# Patient Record
Sex: Female | Born: 1960 | Race: White | Hispanic: No | Smoking: Current every day smoker
Health system: Southern US, Community
[De-identification: ages and names within clinical notes are randomized; demographics above are authoritative.]

## PROBLEM LIST (undated history)

## (undated) DIAGNOSIS — N200 Calculus of kidney: Secondary | ICD-10-CM

## (undated) DIAGNOSIS — Z8711 Personal history of peptic ulcer disease: Secondary | ICD-10-CM

## (undated) DIAGNOSIS — F32A Depression, unspecified: Secondary | ICD-10-CM

## (undated) DIAGNOSIS — G2581 Restless legs syndrome: Secondary | ICD-10-CM

## (undated) DIAGNOSIS — I2510 Atherosclerotic heart disease of native coronary artery without angina pectoris: Secondary | ICD-10-CM

## (undated) DIAGNOSIS — R569 Unspecified convulsions: Secondary | ICD-10-CM

## (undated) DIAGNOSIS — Z720 Tobacco use: Secondary | ICD-10-CM

## (undated) DIAGNOSIS — E7800 Pure hypercholesterolemia, unspecified: Secondary | ICD-10-CM

## (undated) DIAGNOSIS — I10 Essential (primary) hypertension: Secondary | ICD-10-CM

## (undated) DIAGNOSIS — F329 Major depressive disorder, single episode, unspecified: Secondary | ICD-10-CM

## (undated) DIAGNOSIS — F319 Bipolar disorder, unspecified: Secondary | ICD-10-CM

## (undated) DIAGNOSIS — J42 Unspecified chronic bronchitis: Secondary | ICD-10-CM

## (undated) DIAGNOSIS — Z8719 Personal history of other diseases of the digestive system: Secondary | ICD-10-CM

## (undated) DIAGNOSIS — J189 Pneumonia, unspecified organism: Secondary | ICD-10-CM

## (undated) DIAGNOSIS — G43909 Migraine, unspecified, not intractable, without status migrainosus: Secondary | ICD-10-CM

## (undated) DIAGNOSIS — I5189 Other ill-defined heart diseases: Secondary | ICD-10-CM

## (undated) DIAGNOSIS — N393 Stress incontinence (female) (male): Secondary | ICD-10-CM

## (undated) DIAGNOSIS — F419 Anxiety disorder, unspecified: Secondary | ICD-10-CM

## (undated) DIAGNOSIS — R011 Cardiac murmur, unspecified: Secondary | ICD-10-CM

## (undated) DIAGNOSIS — M1990 Unspecified osteoarthritis, unspecified site: Secondary | ICD-10-CM

## (undated) DIAGNOSIS — I639 Cerebral infarction, unspecified: Secondary | ICD-10-CM

## (undated) DIAGNOSIS — G4730 Sleep apnea, unspecified: Secondary | ICD-10-CM

## (undated) DIAGNOSIS — I219 Acute myocardial infarction, unspecified: Secondary | ICD-10-CM

## (undated) DIAGNOSIS — R519 Headache, unspecified: Secondary | ICD-10-CM

## (undated) DIAGNOSIS — E119 Type 2 diabetes mellitus without complications: Secondary | ICD-10-CM

## (undated) DIAGNOSIS — K219 Gastro-esophageal reflux disease without esophagitis: Secondary | ICD-10-CM

## (undated) DIAGNOSIS — G4700 Insomnia, unspecified: Secondary | ICD-10-CM

## (undated) DIAGNOSIS — I739 Peripheral vascular disease, unspecified: Secondary | ICD-10-CM

## (undated) DIAGNOSIS — R51 Headache: Secondary | ICD-10-CM

## (undated) HISTORY — DX: Atherosclerotic heart disease of native coronary artery without angina pectoris: I25.10

## (undated) HISTORY — PX: KNEE ARTHROSCOPY: SHX127

## (undated) HISTORY — PX: CORONARY ANGIOPLASTY WITH STENT PLACEMENT: SHX49

## (undated) HISTORY — DX: Tobacco use: Z72.0

## (undated) HISTORY — DX: Other ill-defined heart diseases: I51.89

## (undated) HISTORY — PX: DILATION AND CURETTAGE OF UTERUS: SHX78

## (undated) HISTORY — PX: EXTRACORPOREAL SHOCK WAVE LITHOTRIPSY: SHX1557

## (undated) HISTORY — PX: TUBAL LIGATION: SHX77

## (undated) HISTORY — PX: TONSILLECTOMY: SUR1361

---

## 1982-04-21 HISTORY — PX: ABDOMINAL HYSTERECTOMY: SHX81

## 2004-03-01 ENCOUNTER — Emergency Department: Payer: Self-pay | Admitting: Emergency Medicine

## 2005-08-21 ENCOUNTER — Emergency Department: Payer: Self-pay | Admitting: Emergency Medicine

## 2008-07-12 ENCOUNTER — Emergency Department: Payer: Self-pay | Admitting: Emergency Medicine

## 2008-07-24 ENCOUNTER — Ambulatory Visit: Payer: Self-pay | Admitting: Cardiovascular Disease

## 2009-02-23 ENCOUNTER — Emergency Department: Payer: Self-pay | Admitting: Emergency Medicine

## 2009-02-28 ENCOUNTER — Ambulatory Visit: Payer: Self-pay | Admitting: Gastroenterology

## 2009-11-21 ENCOUNTER — Ambulatory Visit: Payer: Self-pay | Admitting: Cardiovascular Disease

## 2009-12-20 ENCOUNTER — Ambulatory Visit: Payer: Self-pay | Admitting: Internal Medicine

## 2011-05-07 ENCOUNTER — Emergency Department: Payer: Self-pay | Admitting: Unknown Physician Specialty

## 2011-05-07 LAB — COMPREHENSIVE METABOLIC PANEL
Alkaline Phosphatase: 63 U/L (ref 50–136)
Anion Gap: 14 (ref 7–16)
BUN: 18 mg/dL (ref 7–18)
Calcium, Total: 9.5 mg/dL (ref 8.5–10.1)
Chloride: 110 mmol/L — ABNORMAL HIGH (ref 98–107)
Co2: 23 mmol/L (ref 21–32)
Creatinine: 0.62 mg/dL (ref 0.60–1.30)
EGFR (Non-African Amer.): >60
SGOT(AST): 16 U/L (ref 15–37)
SGPT (ALT): 19 U/L
Sodium: 147 mmol/L — ABNORMAL HIGH (ref 136–145)

## 2011-05-07 LAB — CBC
MCH: 31.6 pg (ref 26.0–34.0)
MCHC: 33.6 g/dL (ref 32.0–36.0)
Platelet: 289 10*3/uL (ref 150–440)
RBC: 4.55 10*6/uL (ref 3.80–5.20)

## 2011-05-07 LAB — CK TOTAL AND CKMB (NOT AT ARMC): CK, Total: 39 U/L (ref 21–215)

## 2011-05-07 LAB — TROPONIN I: Troponin-I: <0.02 ng/mL

## 2011-08-14 ENCOUNTER — Ambulatory Visit: Payer: Self-pay

## 2012-08-23 ENCOUNTER — Emergency Department: Payer: Self-pay | Admitting: Emergency Medicine

## 2012-08-23 LAB — CBC
HCT: 37.5 % (ref 35.0–47.0)
HGB: 12.5 g/dL (ref 12.0–16.0)
MCHC: 33.2 g/dL (ref 32.0–36.0)
RBC: 4.03 10*6/uL (ref 3.80–5.20)
RDW: 14.6 % — ABNORMAL HIGH (ref 11.5–14.5)
WBC: 9.7 10*3/uL (ref 3.6–11.0)

## 2012-08-23 LAB — PROTIME-INR
INR: 0.9
Prothrombin Time: 12.5 secs (ref 11.5–14.7)

## 2012-08-23 LAB — COMPREHENSIVE METABOLIC PANEL
Albumin: 3.6 g/dL (ref 3.4–5.0)
Alkaline Phosphatase: 87 U/L (ref 50–136)
Anion Gap: 4 — ABNORMAL LOW (ref 7–16)
BUN: 15 mg/dL (ref 7–18)
Bilirubin,Total: 0.5 mg/dL (ref 0.2–1.0)
Calcium, Total: 8.8 mg/dL (ref 8.5–10.1)
Chloride: 112 mmol/L — ABNORMAL HIGH (ref 98–107)
EGFR (African American): >60
EGFR (Non-African Amer.): >60
Glucose: 133 mg/dL — ABNORMAL HIGH (ref 65–99)
Osmolality: 290 (ref 275–301)
Potassium: 3.6 mmol/L (ref 3.5–5.1)
SGPT (ALT): 18 U/L (ref 12–78)
Total Protein: 6.5 g/dL (ref 6.4–8.2)

## 2012-08-23 LAB — APTT: Activated PTT: 26.4 secs (ref 23.6–35.9)

## 2013-02-09 ENCOUNTER — Emergency Department: Payer: Self-pay | Admitting: Emergency Medicine

## 2013-02-09 LAB — URINALYSIS, COMPLETE
Bilirubin,UR: NEGATIVE
Glucose,UR: NEGATIVE mg/dL (ref 0–75)
Ketone: NEGATIVE
Ph: 6 (ref 4.5–8.0)
Specific Gravity: 1.005 (ref 1.003–1.030)
Squamous Epithelial: 2

## 2013-02-11 LAB — URINE CULTURE

## 2013-03-02 ENCOUNTER — Ambulatory Visit: Payer: Self-pay | Admitting: Adult Health

## 2013-08-29 ENCOUNTER — Ambulatory Visit: Payer: Self-pay | Admitting: Orthopedic Surgery

## 2013-09-29 LAB — URINALYSIS, COMPLETE
BILIRUBIN, UR: NEGATIVE
Bacteria: NONE SEEN
Glucose,UR: NEGATIVE mg/dL (ref 0–75)
KETONE: NEGATIVE
NITRITE: NEGATIVE
Ph: 6 (ref 4.5–8.0)
Protein: 100
SPECIFIC GRAVITY: 1.017 (ref 1.003–1.030)
WBC UR: 47 /HPF (ref 0–5)

## 2013-09-29 LAB — COMPREHENSIVE METABOLIC PANEL
ALBUMIN: 2.5 g/dL — AB (ref 3.4–5.0)
ALT: 40 U/L (ref 12–78)
ANION GAP: 6 — AB (ref 7–16)
AST: 26 U/L (ref 15–37)
Alkaline Phosphatase: 68 U/L
BILIRUBIN TOTAL: 0.4 mg/dL (ref 0.2–1.0)
BUN: 16 mg/dL (ref 7–18)
CO2: 29 mmol/L (ref 21–32)
CREATININE: 1.14 mg/dL (ref 0.60–1.30)
Calcium, Total: 9.2 mg/dL (ref 8.5–10.1)
Chloride: 103 mmol/L (ref 98–107)
EGFR (African American): >60
EGFR (Non-African Amer.): 56 — ABNORMAL LOW
GLUCOSE: 210 mg/dL — AB (ref 65–99)
OSMOLALITY: 283 (ref 275–301)
Potassium: 3.1 mmol/L — ABNORMAL LOW (ref 3.5–5.1)
Sodium: 138 mmol/L (ref 136–145)
Total Protein: 7.2 g/dL (ref 6.4–8.2)

## 2013-09-29 LAB — CBC
HCT: 33.6 % — ABNORMAL LOW (ref 35.0–47.0)
HGB: 11.1 g/dL — AB (ref 12.0–16.0)
MCH: 31.3 pg (ref 26.0–34.0)
MCHC: 33.1 g/dL (ref 32.0–36.0)
MCV: 95 fL (ref 80–100)
Platelet: 410 10*3/uL (ref 150–440)
RBC: 3.56 10*6/uL — AB (ref 3.80–5.20)
RDW: 14.4 % (ref 11.5–14.5)
WBC: 15.2 10*3/uL — AB (ref 3.6–11.0)

## 2013-09-30 ENCOUNTER — Inpatient Hospital Stay: Payer: Self-pay | Admitting: Internal Medicine

## 2013-10-01 LAB — URINE CULTURE

## 2013-10-01 LAB — WBC: WBC: 10.5 10*3/uL (ref 3.6–11.0)

## 2013-10-04 LAB — CULTURE, BLOOD (SINGLE)

## 2014-01-30 ENCOUNTER — Emergency Department: Payer: Self-pay | Admitting: Emergency Medicine

## 2014-01-30 LAB — URINALYSIS, COMPLETE
BILIRUBIN, UR: NEGATIVE
Glucose,UR: NEGATIVE mg/dL (ref 0–75)
Ketone: NEGATIVE
Nitrite: NEGATIVE
PH: 5 (ref 4.5–8.0)
PROTEIN: NEGATIVE
RBC,UR: 17 /HPF (ref 0–5)
Specific Gravity: 1.019 (ref 1.003–1.030)
Squamous Epithelial: 1

## 2014-01-30 LAB — BASIC METABOLIC PANEL
ANION GAP: 3 — AB (ref 7–16)
BUN: 18 mg/dL (ref 7–18)
CHLORIDE: 113 mmol/L — AB (ref 98–107)
Calcium, Total: 8.4 mg/dL — ABNORMAL LOW (ref 8.5–10.1)
Co2: 25 mmol/L (ref 21–32)
Creatinine: 0.81 mg/dL (ref 0.60–1.30)
EGFR (Non-African Amer.): >60
GLUCOSE: 141 mg/dL — AB (ref 65–99)
OSMOLALITY: 286 (ref 275–301)
POTASSIUM: 3.8 mmol/L (ref 3.5–5.1)
Sodium: 141 mmol/L (ref 136–145)

## 2014-01-30 LAB — CBC
HCT: 42.4 % (ref 35.0–47.0)
HGB: 13.4 g/dL (ref 12.0–16.0)
MCH: 30 pg (ref 26.0–34.0)
MCHC: 31.6 g/dL — ABNORMAL LOW (ref 32.0–36.0)
MCV: 95 fL (ref 80–100)
Platelet: 240 10*3/uL (ref 150–440)
RBC: 4.47 10*6/uL (ref 3.80–5.20)
RDW: 14.5 % (ref 11.5–14.5)
WBC: 9.7 10*3/uL (ref 3.6–11.0)

## 2014-01-30 LAB — TROPONIN I: Troponin-I: 0.04 ng/mL

## 2014-01-31 LAB — TROPONIN I: Troponin-I: 0.04 ng/mL

## 2014-06-13 ENCOUNTER — Observation Stay: Payer: Self-pay | Admitting: Emergency Medicine

## 2014-06-14 ENCOUNTER — Encounter (HOSPITAL_COMMUNITY): Payer: Self-pay | Admitting: General Practice

## 2014-06-14 ENCOUNTER — Inpatient Hospital Stay (HOSPITAL_COMMUNITY)
Admission: AD | Admit: 2014-06-14 | Discharge: 2014-06-16 | DRG: 247 | Disposition: A | Discharge status: Home / Self Care | Payer: Medicare Other | Source: Other Acute Inpatient Hospital | Attending: Internal Medicine | Admitting: Internal Medicine

## 2014-06-14 ENCOUNTER — Telehealth (HOSPITAL_COMMUNITY): Payer: Self-pay | Admitting: Physician Assistant

## 2014-06-14 DIAGNOSIS — Z79899 Other long term (current) drug therapy: Secondary | ICD-10-CM | POA: Diagnosis not present

## 2014-06-14 DIAGNOSIS — Z8249 Family history of ischemic heart disease and other diseases of the circulatory system: Secondary | ICD-10-CM

## 2014-06-14 DIAGNOSIS — I25111 Atherosclerotic heart disease of native coronary artery with angina pectoris with documented spasm: Secondary | ICD-10-CM

## 2014-06-14 DIAGNOSIS — E785 Hyperlipidemia, unspecified: Secondary | ICD-10-CM | POA: Diagnosis not present

## 2014-06-14 DIAGNOSIS — R748 Abnormal levels of other serum enzymes: Secondary | ICD-10-CM

## 2014-06-14 DIAGNOSIS — R079 Chest pain, unspecified: Secondary | ICD-10-CM

## 2014-06-14 DIAGNOSIS — E119 Type 2 diabetes mellitus without complications: Secondary | ICD-10-CM | POA: Diagnosis not present

## 2014-06-14 DIAGNOSIS — Z955 Presence of coronary angioplasty implant and graft: Secondary | ICD-10-CM | POA: Diagnosis not present

## 2014-06-14 DIAGNOSIS — J209 Acute bronchitis, unspecified: Secondary | ICD-10-CM | POA: Diagnosis not present

## 2014-06-14 DIAGNOSIS — G894 Chronic pain syndrome: Secondary | ICD-10-CM | POA: Insufficient documentation

## 2014-06-14 DIAGNOSIS — F319 Bipolar disorder, unspecified: Secondary | ICD-10-CM | POA: Diagnosis not present

## 2014-06-14 DIAGNOSIS — Z720 Tobacco use: Secondary | ICD-10-CM | POA: Diagnosis present

## 2014-06-14 DIAGNOSIS — I10 Essential (primary) hypertension: Secondary | ICD-10-CM | POA: Diagnosis present

## 2014-06-14 DIAGNOSIS — K219 Gastro-esophageal reflux disease without esophagitis: Secondary | ICD-10-CM | POA: Diagnosis not present

## 2014-06-14 DIAGNOSIS — F17210 Nicotine dependence, cigarettes, uncomplicated: Secondary | ICD-10-CM | POA: Diagnosis not present

## 2014-06-14 DIAGNOSIS — I25110 Atherosclerotic heart disease of native coronary artery with unstable angina pectoris: Principal | ICD-10-CM | POA: Diagnosis present

## 2014-06-14 DIAGNOSIS — G8929 Other chronic pain: Secondary | ICD-10-CM | POA: Diagnosis not present

## 2014-06-14 HISTORY — DX: Migraine, unspecified, not intractable, without status migrainosus: G43.909

## 2014-06-14 HISTORY — DX: Headache, unspecified: R51.9

## 2014-06-14 HISTORY — DX: Restless legs syndrome: G25.81

## 2014-06-14 HISTORY — DX: Stress incontinence (female) (male): N39.3

## 2014-06-14 HISTORY — DX: Personal history of other diseases of the digestive system: Z87.19

## 2014-06-14 HISTORY — DX: Depression, unspecified: F32.A

## 2014-06-14 HISTORY — DX: Headache: R51

## 2014-06-14 HISTORY — DX: Acute myocardial infarction, unspecified: I21.9

## 2014-06-14 HISTORY — DX: Anxiety disorder, unspecified: F41.9

## 2014-06-14 HISTORY — DX: Major depressive disorder, single episode, unspecified: F32.9

## 2014-06-14 HISTORY — DX: Unspecified convulsions: R56.9

## 2014-06-14 HISTORY — DX: Cerebral infarction, unspecified: I63.9

## 2014-06-14 HISTORY — DX: Gastro-esophageal reflux disease without esophagitis: K21.9

## 2014-06-14 HISTORY — DX: Unspecified chronic bronchitis: J42

## 2014-06-14 HISTORY — DX: Personal history of peptic ulcer disease: Z87.11

## 2014-06-14 HISTORY — DX: Pure hypercholesterolemia, unspecified: E78.00

## 2014-06-14 HISTORY — DX: Sleep apnea, unspecified: G47.30

## 2014-06-14 HISTORY — DX: Essential (primary) hypertension: I10

## 2014-06-14 HISTORY — DX: Calculus of kidney: N20.0

## 2014-06-14 HISTORY — DX: Cardiac murmur, unspecified: R01.1

## 2014-06-14 HISTORY — DX: Unspecified osteoarthritis, unspecified site: M19.90

## 2014-06-14 HISTORY — DX: Type 2 diabetes mellitus without complications: E11.9

## 2014-06-14 HISTORY — DX: Pneumonia, unspecified organism: J18.9

## 2014-06-14 HISTORY — DX: Bipolar disorder, unspecified: F31.9

## 2014-06-14 LAB — PROTIME-INR
INR: 0.92 (ref 0.00–1.49)
Prothrombin Time: 12.5 seconds (ref 11.6–15.2)

## 2014-06-14 LAB — GLUCOSE, CAPILLARY
GLUCOSE-CAPILLARY: 153 mg/dL — AB (ref 70–99)
GLUCOSE-CAPILLARY: 198 mg/dL — AB (ref 70–99)
GLUCOSE-CAPILLARY: 124 mg/dL — AB (ref 70–99)
GLUCOSE-CAPILLARY: 159 mg/dL — AB (ref 70–99)

## 2014-06-14 LAB — TROPONIN I
Troponin I: <0.03 ng/mL
Troponin I: <0.03 ng/mL
Troponin I: <0.03 ng/mL (ref <0.031)

## 2014-06-14 LAB — RAPID URINE DRUG SCREEN, HOSP PERFORMED
AMPHETAMINES: NOT DETECTED
Barbiturates: NOT DETECTED
Benzodiazepines: POSITIVE — AB
Cocaine: NOT DETECTED
Opiates: POSITIVE — AB
TETRAHYDROCANNABINOL: POSITIVE — AB

## 2014-06-14 LAB — CBC
HEMATOCRIT: 40.2 % (ref 36.0–46.0)
Hemoglobin: 13.3 g/dL (ref 12.0–15.0)
MCH: 30.6 pg (ref 26.0–34.0)
MCHC: 33.1 g/dL (ref 30.0–36.0)
MCV: 92.4 fL (ref 78.0–100.0)
Platelets: 280 10*3/uL (ref 150–400)
RBC: 4.35 MIL/uL (ref 3.87–5.11)
RDW: 13.9 % (ref 11.5–15.5)
WBC: 10.6 10*3/uL — AB (ref 4.0–10.5)

## 2014-06-14 LAB — COMPREHENSIVE METABOLIC PANEL
ALT: 15 U/L (ref 0–35)
AST: 14 U/L (ref 0–37)
Albumin: 3.8 g/dL (ref 3.5–5.2)
Alkaline Phosphatase: 71 U/L (ref 39–117)
Anion gap: 10 (ref 5–15)
BILIRUBIN TOTAL: 0.5 mg/dL (ref 0.3–1.2)
BUN: 18 mg/dL (ref 6–23)
CALCIUM: 9.2 mg/dL (ref 8.4–10.5)
CHLORIDE: 104 mmol/L (ref 96–112)
CO2: 25 mmol/L (ref 19–32)
Creatinine, Ser: 0.68 mg/dL (ref 0.50–1.10)
GFR calc Af Amer: >90 mL/min (ref >90)
GFR calc non Af Amer: >90 mL/min (ref >90)
GLUCOSE: 169 mg/dL — AB (ref 70–99)
POTASSIUM: 3.8 mmol/L (ref 3.5–5.1)
SODIUM: 139 mmol/L (ref 135–145)
Total Protein: 6.4 g/dL (ref 6.0–8.3)

## 2014-06-14 LAB — CREATININE, SERUM
Creatinine, Ser: 0.65 mg/dL (ref 0.50–1.10)
GFR calc non Af Amer: >90 mL/min (ref >90)

## 2014-06-14 LAB — PREGNANCY, URINE: Preg Test, Ur: NEGATIVE

## 2014-06-14 LAB — LIPASE, BLOOD: Lipase: 371 U/L — ABNORMAL HIGH (ref 11–59)

## 2014-06-14 MED ORDER — OXYCODONE HCL 5 MG PO TABS
5.0000 mg | ORAL_TABLET | Freq: Four times a day (QID) | ORAL | Status: DC | PRN
  Administered 2014-06-14 (×3): 5 mg via ORAL
  Filled 2014-06-14 (×5): qty 1

## 2014-06-14 MED ORDER — AZITHROMYCIN 500 MG PO TABS
500.0000 mg | ORAL_TABLET | Freq: Every day | ORAL | Status: AC
  Administered 2014-06-14: 500 mg via ORAL
  Filled 2014-06-14: qty 1

## 2014-06-14 MED ORDER — OXYMETAZOLINE HCL 0.05 % NA SOLN
1.0000 | Freq: Two times a day (BID) | NASAL | Status: DC | PRN
  Filled 2014-06-14: qty 15

## 2014-06-14 MED ORDER — ASPIRIN EC 325 MG PO TBEC
325.0000 mg | DELAYED_RELEASE_TABLET | Freq: Every day | ORAL | Status: DC
  Administered 2014-06-14: 325 mg via ORAL
  Filled 2014-06-14 (×2): qty 1

## 2014-06-14 MED ORDER — GUAIFENESIN ER 600 MG PO TB12
1200.0000 mg | ORAL_TABLET | Freq: Two times a day (BID) | ORAL | Status: DC
  Administered 2014-06-14 – 2014-06-16 (×4): 1200 mg via ORAL
  Filled 2014-06-14 (×9): qty 2

## 2014-06-14 MED ORDER — ENOXAPARIN SODIUM 40 MG/0.4ML ~~LOC~~ SOLN: 40.0000 mg | SUBCUTANEOUS | Status: DC

## 2014-06-14 MED ORDER — GABAPENTIN 300 MG PO CAPS
300.0000 mg | ORAL_CAPSULE | Freq: Three times a day (TID) | ORAL | Status: DC
  Administered 2014-06-14 – 2014-06-16 (×8): 300 mg via ORAL
  Filled 2014-06-14 (×17): qty 1

## 2014-06-14 MED ORDER — ONDANSETRON HCL 4 MG PO TABS: 4.0000 mg | ORAL_TABLET | Freq: Three times a day (TID) | ORAL | Status: DC | PRN

## 2014-06-14 MED ORDER — INSULIN GLARGINE 100 UNIT/ML ~~LOC~~ SOLN
5.0000 [IU] | Freq: Every day | SUBCUTANEOUS | Status: DC
  Administered 2014-06-14 – 2014-06-15 (×2): 5 [IU] via SUBCUTANEOUS
  Filled 2014-06-14 (×3): qty 0.05

## 2014-06-14 MED ORDER — INSULIN ASPART 100 UNIT/ML ~~LOC~~ SOLN
0.0000 [IU] | SUBCUTANEOUS | Status: DC
  Administered 2014-06-14 (×3): 2 [IU] via SUBCUTANEOUS
  Administered 2014-06-15: 13:00:00 5 [IU] via SUBCUTANEOUS

## 2014-06-14 MED ORDER — FLUTICASONE PROPIONATE 50 MCG/ACT NA SUSP
2.0000 | Freq: Every day | NASAL | Status: DC
  Administered 2014-06-14 – 2014-06-16 (×2): 2 via NASAL
  Filled 2014-06-14: qty 16

## 2014-06-14 MED ORDER — ASPIRIN 81 MG PO CHEW
81.0000 mg | CHEWABLE_TABLET | ORAL | Status: AC
  Administered 2014-06-15: 81 mg via ORAL
  Filled 2014-06-14: qty 1

## 2014-06-14 MED ORDER — SODIUM CHLORIDE 0.9 % IV SOLN
INTRAVENOUS | Status: DC
  Administered 2014-06-14: 12:00:00 via INTRAVENOUS

## 2014-06-14 MED ORDER — ALPRAZOLAM 0.5 MG PO TABS
1.0000 mg | ORAL_TABLET | Freq: Three times a day (TID) | ORAL | Status: DC | PRN
  Administered 2014-06-14 – 2014-06-15 (×3): 1 mg via ORAL
  Filled 2014-06-14 (×3): qty 2

## 2014-06-14 MED ORDER — AZITHROMYCIN 250 MG PO TABS
250.0000 mg | ORAL_TABLET | Freq: Every day | ORAL | Status: DC
  Administered 2014-06-15 – 2014-06-16 (×2): 250 mg via ORAL
  Filled 2014-06-14 (×3): qty 1

## 2014-06-14 MED ORDER — TAMSULOSIN HCL 0.4 MG PO CAPS
0.4000 mg | ORAL_CAPSULE | Freq: Every day | ORAL | Status: DC
  Administered 2014-06-14: 0.4 mg via ORAL
  Filled 2014-06-14 (×3): qty 1

## 2014-06-14 MED ORDER — SODIUM CHLORIDE 0.9 % IV SOLN
INTRAVENOUS | Status: DC
  Administered 2014-06-15: 05:00:00 via INTRAVENOUS

## 2014-06-14 MED ORDER — NICOTINE 21 MG/24HR TD PT24
21.0000 mg | MEDICATED_PATCH | Freq: Every day | TRANSDERMAL | Status: DC
  Administered 2014-06-14 – 2014-06-16 (×3): 21 mg via TRANSDERMAL
  Filled 2014-06-14 (×4): qty 1

## 2014-06-14 MED ORDER — CALCIUM CARBONATE ANTACID 500 MG PO CHEW
1.0000 | CHEWABLE_TABLET | Freq: Two times a day (BID) | ORAL | Status: DC
  Administered 2014-06-14 – 2014-06-15 (×3): 200 mg via ORAL
  Filled 2014-06-14 (×9): qty 1

## 2014-06-14 MED ORDER — ONDANSETRON HCL 4 MG/2ML IJ SOLN: 4.0000 mg | Freq: Four times a day (QID) | INTRAMUSCULAR | Status: DC | PRN

## 2014-06-14 MED ORDER — SODIUM CHLORIDE 0.9 % IJ SOLN: 3.0000 mL | INTRAMUSCULAR | Status: DC | PRN

## 2014-06-14 MED ORDER — LEVALBUTEROL HCL 0.63 MG/3ML IN NEBU
0.6300 mg | INHALATION_SOLUTION | Freq: Three times a day (TID) | RESPIRATORY_TRACT | Status: DC
  Administered 2014-06-14 (×2): 0.63 mg via RESPIRATORY_TRACT
  Filled 2014-06-14 (×5): qty 3

## 2014-06-14 MED ORDER — ACETAMINOPHEN 325 MG PO TABS: 650.0000 mg | ORAL_TABLET | ORAL | Status: DC | PRN

## 2014-06-14 MED ORDER — LISINOPRIL 10 MG PO TABS
10.0000 mg | ORAL_TABLET | Freq: Every day | ORAL | Status: DC
  Administered 2014-06-14 – 2014-06-15 (×2): 10 mg via ORAL
  Filled 2014-06-14 (×3): qty 1

## 2014-06-14 MED ORDER — METOPROLOL TARTRATE 12.5 MG HALF TABLET
25.0000 mg | ORAL_TABLET | Freq: Two times a day (BID) | ORAL | Status: DC
  Administered 2014-06-14 – 2014-06-15 (×3): 25 mg via ORAL
  Filled 2014-06-14 (×6): qty 1

## 2014-06-14 MED ORDER — SODIUM CHLORIDE 0.9 % IV SOLN: 250.0000 mL | INTRAVENOUS | Status: DC | PRN

## 2014-06-14 MED ORDER — REGADENOSON 0.4 MG/5ML IV SOLN
0.4000 mg | Freq: Once | INTRAVENOUS | Status: DC
  Filled 2014-06-14: qty 5

## 2014-06-14 MED ORDER — CARISOPRODOL 350 MG PO TABS
350.0000 mg | ORAL_TABLET | Freq: Three times a day (TID) | ORAL | Status: DC
  Administered 2014-06-14 (×2): 350 mg via ORAL
  Filled 2014-06-14 (×4): qty 1

## 2014-06-14 MED ORDER — PANTOPRAZOLE SODIUM 40 MG PO TBEC
40.0000 mg | DELAYED_RELEASE_TABLET | Freq: Every day | ORAL | Status: DC
  Administered 2014-06-14 – 2014-06-15 (×2): 40 mg via ORAL
  Filled 2014-06-14: qty 1

## 2014-06-14 MED ORDER — ONDANSETRON HCL 4 MG PO TABS
4.0000 mg | ORAL_TABLET | Freq: Three times a day (TID) | ORAL | Status: DC | PRN
  Administered 2014-06-16: 4 mg via ORAL
  Filled 2014-06-14 (×2): qty 1

## 2014-06-14 MED ORDER — SODIUM CHLORIDE 0.9 % IJ SOLN
3.0000 mL | Freq: Two times a day (BID) | INTRAMUSCULAR | Status: DC
Start: 2014-06-14 — End: 2014-06-15

## 2014-06-14 MED ORDER — ATORVASTATIN CALCIUM 40 MG PO TABS
40.0000 mg | ORAL_TABLET | Freq: Every day | ORAL | Status: DC
  Administered 2014-06-14 – 2014-06-15 (×2): 40 mg via ORAL
  Filled 2014-06-14 (×4): qty 1

## 2014-06-14 MED ORDER — ENOXAPARIN SODIUM 40 MG/0.4ML ~~LOC~~ SOLN
40.0000 mg | SUBCUTANEOUS | Status: DC
  Administered 2014-06-14: 40 mg via SUBCUTANEOUS
  Filled 2014-06-14 (×2): qty 0.4

## 2014-06-14 MED ORDER — ONDANSETRON HCL 4 MG/2ML IJ SOLN
4.0000 mg | Freq: Four times a day (QID) | INTRAMUSCULAR | Status: DC | PRN
  Administered 2014-06-14 – 2014-06-16 (×4): 4 mg via INTRAVENOUS
  Filled 2014-06-14 (×4): qty 2

## 2014-06-14 MED ORDER — LORATADINE 10 MG PO TABS
10.0000 mg | ORAL_TABLET | Freq: Every day | ORAL | Status: DC
  Administered 2014-06-14 – 2014-06-16 (×2): 10 mg via ORAL
  Filled 2014-06-14 (×4): qty 1

## 2014-06-14 MED ORDER — DIPHENHYDRAMINE HCL 25 MG PO CAPS: 50.0000 mg | ORAL_CAPSULE | Freq: Four times a day (QID) | ORAL | Status: DC | PRN

## 2014-06-14 NOTE — Progress Notes (Signed)
Cath delayed until tomorrow 7:30 am first case. Patient informed. Will resume diet and NPO past midnight. Patient's nurse notified.  Ramond DialSigned, Marcela Alatorre PA Pager: [phone]

## 2014-06-14 NOTE — H&P (Signed)
[facility] History and Physical                                                                                    Jennifer SenegalLisa [name], is a 54 y.o. female  MRN: [id number]   DOB - 1960/09/20  Admit Date - 06/14/2014  Outpatient Primary MD for the patient is No PCP Per Patient Dr. Juleen StarrGregory Means, [facility] Cardiologist: Dr. Excell SeltzerBaker, [facility]    HPI  Jennifer [name]  is a 54 y.o. female, with a past medical history of coronary artery disease (status post 2 stent placements), diabetes, hypertension, tobacco abuse, hyperlipidemia, bipolar disorder, GERD who presented [facility] with complaints of intermittent chest pain that started last Friday. The pain lasts for several minutes and radiates to her neck and down her arms bilaterally. She became significantly worried when it happened resting in bed and was so severe that she had to sit up and rock. The pain is worse with movement is associated with nausea and headache. Jennifer [name] has also been coughing a lot with congestion. She has had posttussive vomiting. She reports having no energy, and decreased exercise tolerance.  In [facility] her first troponin was negative, her EKG showed normal sinus rhythm. Chest x-ray was negative. We will admit her for acute coronary syndrome rule out, bronchitis, and likely atypical chest pain. Cardiology has been consulted to see her.  Of note, per the RN, patient was taking medications out of bottles from home. One of the bottles was listed as gabapentin but the pills inside were actually Concerta.  Review of Systems   In addition to the HPI above,  No Fever-chills, No problems swallowing food or Liquids, No Blood in stool or Urine, No dysuria, No new skin rashes or bruises, No new joints pains-aches,  No recent weight gain or loss, A full 10 point Review of Systems was done, except as stated above, all other Review of Systems were negative.  Social History History  Substance Use Topics   . Smoking status: Not on file  . Smokeless tobacco: Not on file  . Alcohol Use: Not on file   She has smoked 1-1/2 packs of cigarettes a day for the past 35 years. She denies excessive alcohol use.  Family History Both grandparents passed with MI.  Father passed with cirrhosis.  Mother passed with a brain tumor.  Prior to Admission medications   Not on File    Allergies  Allergen Reactions  . Lithium Other (See Comments)    Blood dyscrasia  . Tegretol [Carbamazepine] Other (See Comments)    Blood dyscrasia when on with Lithium  . Lodine [Etodolac] Other (See Comments)    Rash, N/V    Physical Exam  Vitals  Blood pressure 138/69, pulse 62, temperature 98 F (36.7 C), temperature source Oral, resp. rate 16, height 5\' 2"  (1.575 m), weight 83.3 kg (183 lb 10.3 oz), SpO2 97 %.   General:  Well-developed, well-nourished female lying in bed in NAD  Psych:  Mildly anxious, multiple complaints about being [place]. Otherwise cooperative.  Neuro:   No F.N deficits, ALL C.Nerves Intact, Strength 5/5  all 4 extremities, Sensation intact all 4 extremities.  ENT:  Ears and Eyes appear Normal, Conjunctivae clear, PER. Moist oral mucosa without erythema or exudates.  Neck:  Supple, No lymphadenopathy appreciated  Respiratory:  Minimal expiratory wheeze bilaterally, good air movement.  Cardiac:  RRR, No Murmurs, no LE edema noted, no JVD.    Abdomen:  Positive bowel sounds, Soft, Non tender, Non distended,  No masses appreciated  Skin:  No Cyanosis, Normal Skin Turgor, No Skin Rash or Bruise.  Extremities:  Able to move all 4. 5/5 strength in each,  no effusions.   My personal review of EKG: NSR, No ST changes noted.   Assessment & Plan  Principal Problem:   Chest pain at rest Active Problems:   Diabetes   Acute bronchitis   Tobacco abuse   Essential hypertension   GERD (gastroesophageal reflux disease)   Bipolar disorder    Chest pain   likely atypical from bronchitis, but will rule out acute coronary syndrome.   treat with aspirin, sublingual nitroglycerin, cycle troponins, cardiology consulted  Will also check CMET and Lipase.  Add Protonix, and Tums.  HTN Continue Metoprolol and Lisinopril  HLD Continue Statin.  Diabetes mellitus Start SSI every 4 hours while nothing by mouth. Check hemoglobin A1c. Carb modified diet  Acute bronchitis Treatment with Zpack.  Supportive care including Flonase, Claritin, nebulizers.  Tobacco abuse  Counseled to stop.  Transdermal nicotine daily.  GERD PPI therapy  Chronic pain Previous shoulder and knee surgeries. Continue Soma, gabapentin and PRN Norco. Check UDS  Bipolar DO. Continue Xanax.  DVT Prophylaxis: Lovenox  AM Labs Ordered, also please review Full Orders  Family Communication:   Patient is alert and oriented  Code Status:  Full  Condition:  Guarded  Time spent in minutes : [address]  PA-C on 06/14/2014 at 8:19 AM  Between 7am to 7pm - Pager - [phone]  After 7pm go to [email or web address] - password [email or web address]  And look for the night coverage person covering me after hours  [facility]

## 2014-06-14 NOTE — Progress Notes (Signed)
NP texted paged with update on patient status, awaiting call back or orders. Pt HR is stable, patient back in bed, 2L [place] placed, will get a BP to assess further.

## 2014-06-14 NOTE — Progress Notes (Addendum)
When [facility], patient stated she felt much and was resting and discomfort had eased, patient did not want XANAX at this time, will inform night RN.

## 2014-06-14 NOTE — Consult Note (Signed)
    CONSULT NOTE  Date: 06/14/2014               Patient Name:  Jennifer [name] MRN: [id number]  DOB: 07/06/1960 Age / Sex: 54 y.o., female        PCP: No PCP Per Patient Primary Cardiologist: [facility], [facility]            Referring Physician: Dhungel              Reason for Consult: Chest discomfort           History of Present Illness: Patient is a 54 y.o. female with a PMHx of CAD ( s/p placement of 2 stents) , DM, HTN, smoker, hyperlipidemia, bipolar disease , who was transferred from [facility] and was admitted to [facility] on 06/14/2014 for evaluation of chest discomfort.   The pain started last Friday ( 5 days ago). intermittant CP, radiation down both arms.  Last for 3-5 minutes.   Worse with walking around and associated with Nausea and headache.  Associated with dyspnea. Radiates to neck and jaw.  Burning, through to back, band like sensation She has chest discomfort with any amount of exertion   Has had URI symptoms - coughing. Some vomitting .   Has had lots of stress over the past year - family , health  issues, mental issues,   Smoker  - 1/2 - 1 ppd.  Sometimes more,  Less recently with e-cig.   + family hx      Medications: Outpatient medications: Prescriptions prior to admission  Medication Sig Dispense Refill Last Dose  . ALPRAZolam (XANAX PO) Take 1 tablet by mouth 3 (three) times daily as needed.   06/09/2014  . carisoprodol (SOMA) 350 MG tablet Take 350 mg by mouth 3 (three) times daily as needed for muscle spasms.   06/13/2014 at Unknown time  . gabapentin (NEURONTIN) 300 MG capsule Take 300 mg by mouth 3 (three) times daily as needed.   06/13/2014 at Unknown time  . ibuprofen (ADVIL,MOTRIN) 200 MG tablet Take 800 mg by mouth 4 (four) times daily as needed for moderate pain.   06/13/2014 at Unknown time  . lisinopril (PRINIVIL,ZESTRIL) 40 MG tablet Take 40 mg by mouth daily.   06/13/2014 at Unknown time  . oxycodone (OXY-IR) 5 MG capsule Take 5 mg by mouth every  4 (four) hours as needed for pain.   06/08/2014  . PRESCRIPTION MEDICATION Take 200 mg by mouth daily. Take 200 mg metoprolol daily.   06/13/2014 at 12 pm    Current medications: Current Facility-Administered Medications  Medication Dose Route Frequency Provider Last Rate Last Dose  . 0.9 %  sodium chloride infusion   Intravenous Continuous Marianne L York, PA-C      . acetaminophen (TYLENOL) tablet 650 mg  650 mg Oral Q4H PRN Nishant Dhungel, MD      . ALPRAZolam (XANAX) tablet 1 mg  1 mg Oral TID PRN Marianne L York, PA-C      . aspirin EC tablet 325 mg  325 mg Oral Daily Nishant Dhungel, MD      . atorvastatin (LIPITOR) tablet 40 mg  40 mg Oral q1800 Marianne L York, PA-C      . azithromycin (ZITHROMAX) tablet 500 mg  500 mg Oral Daily Marianne L York, PA-C       Followed by  . [START ON 06/15/2014] azithromycin (ZITHROMAX) tablet 250 mg  250 mg Oral Daily Marianne L York, PA-C      .   calcium carbonate (TUMS - dosed in mg elemental calcium) chewable tablet 200 mg of elemental calcium  1 tablet Oral BID WC Marianne L York, PA-C      . carisoprodol (SOMA) tablet 350 mg  350 mg Oral TID Marianne L York, PA-C      . diphenhydrAMINE (BENADRYL) capsule 50 mg  50 mg Oral Q6H PRN Marianne L York, PA-C      . enoxaparin (LOVENOX) injection 40 mg  40 mg Subcutaneous Q24H Nishant Dhungel, MD      . fluticasone (FLONASE) 50 MCG/ACT nasal spray 2 spray  2 spray Each Nare Daily Marianne L York, PA-C      . gabapentin (NEURONTIN) capsule 300 mg  300 mg Oral TID PC & HS Marianne L York, PA-C      . guaiFENesin (MUCINEX) 12 hr tablet 1,200 mg  1,200 mg Oral BID Marianne L York, PA-C      . insulin aspart (novoLOG) injection 0-9 Units  0-9 Units Subcutaneous 6 times per day Marianne L York, PA-C      . insulin glargine (LANTUS) injection 5 Units  5 Units Subcutaneous QHS Marianne L York, PA-C      . levalbuterol (XOPENEX) nebulizer solution 0.63 mg  0.63 mg Nebulization Q8H Marianne L York, PA-C      .  lisinopril (PRINIVIL,ZESTRIL) tablet 10 mg  10 mg Oral Daily Marianne L York, PA-C      . loratadine (CLARITIN) tablet 10 mg  10 mg Oral Daily Marianne L York, PA-C      . metoprolol tartrate (LOPRESSOR) tablet 25 mg  25 mg Oral BID Nishant Dhungel, MD      . nicotine (NICODERM CQ - dosed in mg/24 hours) patch 21 mg  21 mg Transdermal Daily Marianne L York, PA-C      . ondansetron (ZOFRAN) injection 4 mg  4 mg Intravenous Q6H PRN Marianne L York, PA-C      . ondansetron (ZOFRAN) tablet 4 mg  4 mg Oral Q8H PRN Nishant Dhungel, MD      . oxyCODONE (Oxy IR/ROXICODONE) immediate release tablet 5 mg  5 mg Oral Q6H PRN Marianne L York, PA-C      . oxymetazoline (AFRIN) 0.05 % nasal spray 1 spray  1 spray Each Nare BID PRN Marianne L York, PA-C      . pantoprazole (PROTONIX) EC tablet 40 mg  40 mg Oral Daily Marianne L York, PA-C      . tamsulosin (FLOMAX) capsule 0.4 mg  0.4 mg Oral QPC supper Marianne L York, PA-C         Allergies  Allergen Reactions  . Lithium Other (See Comments)    Blood dyscrasia  . Tegretol [Carbamazepine] Other (See Comments)    Blood dyscrasia when on with Lithium  . Lodine [Etodolac] Other (See Comments)    Rash, N/V     No past medical history on file.  No past surgical history on file.  No family history on file.  Social History:  has no tobacco, alcohol, and drug history on file.   Review of Systems: Constitutional:  denies fever, chills, diaphoresis, appetite change and fatigue.  HEENT: denies photophobia, eye pain, redness, hearing loss, ear pain, congestion, sore throat, rhinorrhea, sneezing, neck pain, neck stiffness and tinnitus.  Respiratory: admits to SOB, DOE, cough, chest tightness, and wheezing.  Cardiovascular: admits to chest pain,  Denies palpitations and leg swelling.  Gastrointestinal: admits to nausea, vomiting,    Genitourinary: denies dysuria, urgency,   frequency, hematuria, flank pain and difficulty urinating.  Musculoskeletal: denies   myalgias, back pain, joint swelling, arthralgias and gait problem.   Skin: denies pallor, rash and wound.  Neurological: denies dizziness, seizures, syncope, weakness, light-headedness, numbness and headaches.   Hematological: denies adenopathy, easy bruising, personal or family bleeding history.  Psychiatric/ Behavioral: denies suicidal ideation, mood changes, confusion, nervousness, sleep disturbance and agitation.    Physical Exam: BP 138/69 mmHg  Pulse 62  Temp(Src) 98 F (36.7 C) (Oral)  Resp 16  Ht 5' 2" (1.575 m)  Wt 183 lb 10.3 oz (83.3 kg)  BMI 33.58 kg/m2  SpO2 97%  Wt Readings from Last 3 Encounters:  06/14/14 183 lb 10.3 oz (83.3 kg)    General: Vital signs reviewed and noted. Well-developed, well-nourished, in no acute distress; alert,   Head: Normocephalic, atraumatic, sclera anicteric,   Neck: Supple. Negative for carotid bruits. No JVD   Lungs:  Clear bilaterally, no  wheezes, rales, or rhonchi. Breathing is normal   Heart: RRR with S1 S2. No murmurs, rubs, or gallops   Abdomen/ GI :  Soft, non-tender, non-distended with normoactive bowel sounds. No hepatomegaly. No rebound/guarding. No obvious abdominal masses   MSK: Strength and the appear normal for age.   Extremities: No clubbing or cyanosis. No edema.  Distal pedal pulses are 2+ and equal   Neurologic:  CN are grossly intact,  No obvious motor or sensory defect.  Alert and oriented X 3. Moves all extremities spontaneously.  Psych: Responds to questions appropriately with a normal affect.     Lab results: Basic Metabolic Panel: No results for input(s): NA, K, CL, CO2, GLUCOSE, BUN, CREATININE, CALCIUM, MG, PHOS in the last 168 hours.  Liver Function Tests: No results for input(s): AST, ALT, ALKPHOS, BILITOT, PROT, ALBUMIN in the last 168 hours. No results for input(s): LIPASE, AMYLASE in the last 168 hours. No results for input(s): AMMONIA in the last 168 hours.  CBC:  Recent Labs Lab 06/14/14 0835    WBC 10.6*  HGB 13.3  HCT 40.2  MCV 92.4  PLT 280    Cardiac Enzymes: No results for input(s): CKTOTAL, CKMB, CKMBINDEX, TROPONINI in the last 168 hours.  BNP: Invalid input(s): POCBNP  CBG:  Recent Labs Lab 06/14/14 0628  GLUCAP 153*    Coagulation Studies: No results for input(s): LABPROT, INR in the last 72 hours.   Other results: Personal review of EKG shows :  - has been ordered   Imaging:  No results found.       Assessment & Plan: 1. Unstable angina:  Jennifer [name] presents with symptoms of unstable angina.  She has severe CP walking 20-30 feet.  Associated with dyspnea, jaw pain, severe dyspnea. She has knows CAD and has 2 stents -   Will schedule her for cardiac cath today.  With her CP with each time that she walks, I'm not sure that a myoview would be very helpful.  2. Smoking hx :  Advised her to stop smoking  3.  Diabetes mellitus:  Continue current plans per Int. Med team.   4.  Essential Hypertension:  BP is ok.  Continue current plans       Maryellen Dowdle J. Dominque Levandowski, Jr., MD, FACC 06/14/2014, 9:10 AM Office - [phone] Pager [phone]       

## 2014-06-14 NOTE — Progress Notes (Signed)
Report was received from [facility] stating that pt had taken medications without staff's knowledge or consent.  When pt arrived to this unit, pt stated that she would take medications if she wasn't getting what she thought she needed.  Pt's medications were locked up in pharmacy with pt's approval.

## 2014-06-14 NOTE — Progress Notes (Signed)
Patient was standing at the sink, while giving herself a bath, patient had an acute episode of SEVERE Pain in her chest and upper bilateral arms with numbess/tinglinging.  Patient was given oxycodone 5mg  a few mins ago for pain.  Paged NP FOR orders, awaiting page.
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
## 2014-06-14 NOTE — Progress Notes (Signed)
Pt arrived to the unit assisted to bed by nursing staff.  Pt placed on telemetry monitor.  Pt denies chest pain and shortness of breath at present time, will notify admitting doctor of pt's arrival.  No acute distress noted at present time.

## 2014-06-14 NOTE — Progress Notes (Signed)
NP did call me back, will give XANAX 1mg  to see if that helps the patients, Soma dose for tonight [facility] since 1600 was given 1800 (patient wanted the dose after dinner).

## 2014-06-15 ENCOUNTER — Encounter (HOSPITAL_COMMUNITY): Payer: Self-pay | Admitting: Interventional Cardiology

## 2014-06-15 ENCOUNTER — Encounter (HOSPITAL_COMMUNITY)
Admission: AD | Disposition: A | Discharge status: Home / Self Care | Payer: Medicare Other | Source: Other Acute Inpatient Hospital | Attending: Internal Medicine

## 2014-06-15 DIAGNOSIS — I25110 Atherosclerotic heart disease of native coronary artery with unstable angina pectoris: Principal | ICD-10-CM

## 2014-06-15 DIAGNOSIS — R079 Chest pain, unspecified: Secondary | ICD-10-CM | POA: Diagnosis present

## 2014-06-15 DIAGNOSIS — K219 Gastro-esophageal reflux disease without esophagitis: Secondary | ICD-10-CM

## 2014-06-15 DIAGNOSIS — E1159 Type 2 diabetes mellitus with other circulatory complications: Secondary | ICD-10-CM

## 2014-06-15 DIAGNOSIS — F3110 Bipolar disorder, current episode manic without psychotic features, unspecified: Secondary | ICD-10-CM

## 2014-06-15 DIAGNOSIS — I200 Unstable angina: Secondary | ICD-10-CM

## 2014-06-15 DIAGNOSIS — G8929 Other chronic pain: Secondary | ICD-10-CM | POA: Diagnosis present

## 2014-06-15 DIAGNOSIS — F17210 Nicotine dependence, cigarettes, uncomplicated: Secondary | ICD-10-CM | POA: Diagnosis present

## 2014-06-15 DIAGNOSIS — J209 Acute bronchitis, unspecified: Secondary | ICD-10-CM | POA: Diagnosis present

## 2014-06-15 DIAGNOSIS — Z79899 Other long term (current) drug therapy: Secondary | ICD-10-CM | POA: Diagnosis not present

## 2014-06-15 DIAGNOSIS — E785 Hyperlipidemia, unspecified: Secondary | ICD-10-CM | POA: Diagnosis present

## 2014-06-15 DIAGNOSIS — Z8249 Family history of ischemic heart disease and other diseases of the circulatory system: Secondary | ICD-10-CM | POA: Diagnosis not present

## 2014-06-15 DIAGNOSIS — E119 Type 2 diabetes mellitus without complications: Secondary | ICD-10-CM | POA: Diagnosis present

## 2014-06-15 DIAGNOSIS — Z955 Presence of coronary angioplasty implant and graft: Secondary | ICD-10-CM | POA: Diagnosis not present

## 2014-06-15 DIAGNOSIS — I10 Essential (primary) hypertension: Secondary | ICD-10-CM | POA: Diagnosis present

## 2014-06-15 DIAGNOSIS — F319 Bipolar disorder, unspecified: Secondary | ICD-10-CM | POA: Diagnosis present

## 2014-06-15 HISTORY — PX: PERCUTANEOUS CORONARY STENT INTERVENTION (PCI-S): SHX5485

## 2014-06-15 HISTORY — PX: LEFT HEART CATHETERIZATION WITH CORONARY ANGIOGRAM: SHX5451

## 2014-06-15 LAB — GLUCOSE, CAPILLARY
GLUCOSE-CAPILLARY: 106 mg/dL — AB (ref 70–99)
Glucose-Capillary: 109 mg/dL — ABNORMAL HIGH (ref 70–99)
Glucose-Capillary: 170 mg/dL — ABNORMAL HIGH (ref 70–99)
Glucose-Capillary: 218 mg/dL — ABNORMAL HIGH (ref 70–99)

## 2014-06-15 LAB — POCT ACTIVATED CLOTTING TIME: Activated Clotting Time: 435 seconds

## 2014-06-15 LAB — HEMOGLOBIN A1C
Hgb A1c MFr Bld: 7.9 % — ABNORMAL HIGH (ref 4.8–5.6)
Mean Plasma Glucose: 180 mg/dL

## 2014-06-15 SURGERY — LEFT HEART CATHETERIZATION WITH CORONARY ANGIOGRAM

## 2014-06-15 MED ORDER — LISINOPRIL 20 MG PO TABS: 20.0000 mg | ORAL_TABLET | Freq: Every day | ORAL | Status: DC

## 2014-06-15 MED ORDER — CLOPIDOGREL BISULFATE 75 MG PO TABS
75.0000 mg | ORAL_TABLET | Freq: Every day | ORAL | Status: DC
  Administered 2014-06-16: 09:00:00 75 mg via ORAL
  Filled 2014-06-15 (×2): qty 1

## 2014-06-15 MED ORDER — HYDRALAZINE HCL 20 MG/ML IJ SOLN
10.0000 mg | Freq: Three times a day (TID) | INTRAMUSCULAR | Status: DC | PRN
Start: 2014-06-15 — End: 2014-06-16

## 2014-06-15 MED ORDER — CLOPIDOGREL BISULFATE 300 MG PO TABS
ORAL_TABLET | ORAL | Status: AC
  Filled 2014-06-15: qty 1

## 2014-06-15 MED ORDER — METOPROLOL TARTRATE 12.5 MG HALF TABLET: 50.0000 mg | ORAL_TABLET | Freq: Two times a day (BID) | ORAL | Status: DC

## 2014-06-15 MED ORDER — VENLAFAXINE HCL 75 MG PO TABS
75.0000 mg | ORAL_TABLET | Freq: Two times a day (BID) | ORAL | Status: DC
  Administered 2014-06-15 – 2014-06-16 (×2): 75 mg via ORAL
  Filled 2014-06-15 (×5): qty 1

## 2014-06-15 MED ORDER — HEPARIN SODIUM (PORCINE) 1000 UNIT/ML IJ SOLN
INTRAMUSCULAR | Status: AC
  Filled 2014-06-15: qty 1

## 2014-06-15 MED ORDER — BIVALIRUDIN 250 MG IV SOLR
INTRAVENOUS | Status: AC
  Filled 2014-06-15: qty 250

## 2014-06-15 MED ORDER — ALPRAZOLAM 0.25 MG PO TABS
ORAL_TABLET | ORAL | Status: AC
  Filled 2014-06-15: qty 4

## 2014-06-15 MED ORDER — LISINOPRIL 20 MG PO TABS
20.0000 mg | ORAL_TABLET | Freq: Every day | ORAL | Status: DC
  Administered 2014-06-15 – 2014-06-16 (×2): 20 mg via ORAL
  Filled 2014-06-15 (×2): qty 1

## 2014-06-15 MED ORDER — INSULIN ASPART 100 UNIT/ML ~~LOC~~ SOLN
0.0000 [IU] | Freq: Three times a day (TID) | SUBCUTANEOUS | Status: DC
  Administered 2014-06-15: 21:00:00 2 [IU] via SUBCUTANEOUS

## 2014-06-15 MED ORDER — LEVALBUTEROL HCL 0.63 MG/3ML IN NEBU
0.6300 mg | INHALATION_SOLUTION | Freq: Two times a day (BID) | RESPIRATORY_TRACT | Status: DC
  Administered 2014-06-15: 0.63 mg via RESPIRATORY_TRACT
  Filled 2014-06-15 (×6): qty 3

## 2014-06-15 MED ORDER — FAMOTIDINE IN NACL 20-0.9 MG/50ML-% IV SOLN
INTRAVENOUS | Status: AC
  Filled 2014-06-15: qty 50

## 2014-06-15 MED ORDER — METOPROLOL TARTRATE 12.5 MG HALF TABLET: 100.0000 mg | ORAL_TABLET | Freq: Two times a day (BID) | ORAL | Status: DC

## 2014-06-15 MED ORDER — ENOXAPARIN SODIUM 40 MG/0.4ML ~~LOC~~ SOLN
40.0000 mg | SUBCUTANEOUS | Status: DC
Start: 2014-06-16 — End: 2014-06-16
  Administered 2014-06-16: 10:00:00 40 mg via SUBCUTANEOUS
  Filled 2014-06-15: qty 0.4

## 2014-06-15 MED ORDER — SODIUM CHLORIDE 0.9 % IV SOLN: 1.0000 mL/kg/h | INTRAVENOUS | Status: AC

## 2014-06-15 MED ORDER — MORPHINE SULFATE 10 MG/ML IJ SOLN
2.0000 mg | INTRAMUSCULAR | Status: DC | PRN
  Administered 2014-06-15: 2 mg via INTRAVENOUS

## 2014-06-15 MED ORDER — LIDOCAINE HCL (PF) 1 % IJ SOLN
INTRAMUSCULAR | Status: AC
  Filled 2014-06-15: qty 30

## 2014-06-15 MED ORDER — VERAPAMIL HCL 2.5 MG/ML IV SOLN
INTRAVENOUS | Status: AC
  Filled 2014-06-15: qty 2

## 2014-06-15 MED ORDER — MIDAZOLAM HCL 2 MG/2ML IJ SOLN
INTRAMUSCULAR | Status: AC
  Filled 2014-06-15: qty 2

## 2014-06-15 MED ORDER — PANTOPRAZOLE SODIUM 40 MG PO TBEC
40.0000 mg | DELAYED_RELEASE_TABLET | Freq: Two times a day (BID) | ORAL | Status: DC
  Administered 2014-06-15 – 2014-06-16 (×2): 40 mg via ORAL
  Filled 2014-06-15 (×2): qty 1

## 2014-06-15 MED ORDER — OXYCODONE HCL 5 MG PO TABS
5.0000 mg | ORAL_TABLET | Freq: Once | ORAL | Status: AC
  Administered 2014-06-15: 5 mg via ORAL

## 2014-06-15 MED ORDER — ALPRAZOLAM 0.5 MG PO TABS
0.5000 mg | ORAL_TABLET | Freq: Three times a day (TID) | ORAL | Status: DC | PRN
  Administered 2014-06-15 – 2014-06-16 (×2): 0.5 mg via ORAL
  Filled 2014-06-15 (×2): qty 1

## 2014-06-15 MED ORDER — FENTANYL CITRATE 0.05 MG/ML IJ SOLN
INTRAMUSCULAR | Status: AC
  Filled 2014-06-15: qty 2

## 2014-06-15 MED ORDER — METOPROLOL TARTRATE 12.5 MG HALF TABLET
100.0000 mg | ORAL_TABLET | Freq: Two times a day (BID) | ORAL | Status: DC
  Administered 2014-06-15 – 2014-06-16 (×3): 100 mg via ORAL
  Filled 2014-06-15 (×2): qty 8

## 2014-06-15 MED ORDER — ASPIRIN 81 MG PO CHEW
81.0000 mg | CHEWABLE_TABLET | Freq: Every day | ORAL | Status: DC
Start: 2014-06-16 — End: 2014-06-16
  Administered 2014-06-16: 10:00:00 81 mg via ORAL
  Filled 2014-06-15: qty 1

## 2014-06-15 MED ORDER — MORPHINE SULFATE 2 MG/ML IJ SOLN: 2.0000 mg | INTRAMUSCULAR | Status: DC | PRN

## 2014-06-15 MED ORDER — HEPARIN (PORCINE) IN NACL 2-0.9 UNIT/ML-% IJ SOLN
INTRAMUSCULAR | Status: AC
  Filled 2014-06-15: qty 1000

## 2014-06-15 MED ORDER — OXYCODONE HCL 5 MG PO TABS
5.0000 mg | ORAL_TABLET | ORAL | Status: DC | PRN
  Administered 2014-06-15 – 2014-06-16 (×3): 5 mg via ORAL
  Filled 2014-06-15 (×3): qty 1

## 2014-06-15 MED ORDER — CARISOPRODOL 350 MG PO TABS
350.0000 mg | ORAL_TABLET | Freq: Three times a day (TID) | ORAL | Status: DC | PRN
  Administered 2014-06-15 – 2014-06-16 (×3): 350 mg via ORAL
  Filled 2014-06-15 (×3): qty 1

## 2014-06-15 MED ORDER — ONDANSETRON HCL 4 MG/2ML IJ SOLN
INTRAMUSCULAR | Status: AC
  Filled 2014-06-15: qty 2

## 2014-06-15 MED ORDER — NITROGLYCERIN IN D5W 200-5 MCG/ML-% IV SOLN
INTRAVENOUS | Status: AC
  Filled 2014-06-15: qty 250

## 2014-06-15 MED ORDER — SODIUM CHLORIDE 0.9 % IV SOLN
0.2500 mg/kg/h | INTRAVENOUS | Status: AC
  Filled 2014-06-15: qty 250

## 2014-06-15 MED ORDER — MORPHINE SULFATE 2 MG/ML IJ SOLN
INTRAMUSCULAR | Status: AC
Start: 2014-06-15 — End: 2014-06-15
  Filled 2014-06-15: qty 1

## 2014-06-15 MED ORDER — NITROGLYCERIN 1 MG/10 ML FOR IR/CATH LAB
INTRA_ARTERIAL | Status: AC
  Filled 2014-06-15: qty 10

## 2014-06-15 MED ORDER — GABAPENTIN 300 MG PO CAPS
300.0000 mg | ORAL_CAPSULE | Freq: Three times a day (TID) | ORAL | Status: DC | PRN
  Filled 2014-06-15: qty 1

## 2014-06-15 NOTE — Progress Notes (Signed)
Pt C/O headache and quickly asleep.
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
## 2014-06-15 NOTE — Clinical Social Work Psychosocial (Signed)
Clinical Social Work Department BRIEF PSYCHOSOCIAL ASSESSMENT 06/15/2014  Patient:  Alamillo,Winola     Account Number:  [id number]     Admit date:  06/14/2014  Clinical Social Worker:  Arrie AranGIVENS,Dow Blahnik,  Date/Time:  06/15/2014 04:00 PM  Referred by:  Physician  Date Referred:  06/15/2014  Other Referral:   Interview type:  PATIENT Other interview type:    PSYCHOSOCIAL DATA Living Status:  ALONE Admitted from facility:   Level of care:   Primary support name:  N/A Primary support relationship to patient:   Degree of support available:   N/A    CURRENT CONCERNS Current Concerns  Other - See comment   Other Concerns:   Transportation needs.    SOCIAL WORK ASSESSMENT / PLAN CSW and CSW-Intern spoke with Ms. Albertina SenegalLisa Iturralde at bedside asked her about the consult regarding restrictions on driving. Patient stated the MD didn't mention any driving limitations to her. Ms. Margot AblesMerrill  Expressed that she needs transportation to [facility] at discharge.She stated her car was parked there prior to being transferred [facility]. Patient indicated her extended family is unable to transport [facility] where her car is parked. CSW informed her that transportation will be provided when she is ready for discharge.   Assessment/plan status:  Psychosocial Support/Ongoing Assessment of Needs Other assessment/ plan:   Information/referral to community resources:   None needed or requested at this time.    PATIENT'S/FAMILY'S RESPONSE TO PLAN OF CARE: Ms. Margot AblesMerrill was receptive to speaking with CSW and CSW-Intern. Patient expressed frustration with her family when speaking to CSW and CSW-Intern.

## 2014-06-15 NOTE — CV Procedure (Signed)
PROCEDURE:  Left heart catheterization with selective coronary angiography, left ventriculogram.  PCI RCA  INDICATIONS:  Unstable angina  The risks, benefits, and details of the procedure were explained to the patient.  The patient verbalized understanding and wanted to proceed.  Informed written consent was obtained.  PROCEDURE TECHNIQUE:  After Xylocaine anesthesia a 91F slender sheath was placed in the right radial artery with a single anterior needle wall stick.   IV Heparin was given.  Right coronary angiography was done using a 3DRC guide catheter.  Left coronary angiography was done using a Judkins L3.5 guide catheter.  Left ventriculography was done using a pigtail catheter.  A TR band was used for hemostasis.   CONTRAST:  Total of 140 cc.  COMPLICATIONS:  None.    HEMODYNAMICS:  Aortic pressure was 154/85; LV pressure was 154/10; LVEDP 28.  There was no gradient between the left ventricle and aorta.    ANGIOGRAPHIC DATA:   The left main coronary artery is a small vessel but widely patent.  The left anterior descending artery is a large vessel which reaches the apex.  There is a large first diagonal which has mild atherosclerosis. This vessel branches across the lateral wall. In the ostium of the lower pole, there is a moderate stenosis.  The left circumflex artery is a medium-sized vessel proximally. There is a medium-sized OM1 which has mild disease. The remainder of the AV groove circumflex has a focal 70% stenosis. This vessel is quite small, likely less than 2 mm in diameter.  The right coronary artery is a very large, dominant vessel. The stents in the mid right coronary artery appear widely patent. There is mild disease proximally. In the distal RCA, there is an area of moderate disease followed by a focal severe, 90% stenosis. The posterior descending artery is large and widely patent, with mild proximal disease. The posterior lateral artery is very large and branches  across lateral wall. This is also widely patent with only mild atherosclerosis.  LEFT VENTRICULOGRAM:  Left ventricular angiogram was done in the 30 RAO projection and revealed normal left ventricular wall motion and systolic function with an estimated ejection fraction of 60 %.  LVEDP was 28 mmHg.  PCI NARRATIVE: An Akari right 1.5 guide catheter was used since it was difficult to reach the ostium of the RCA. Angiomax was used for anticoagulation. ACT was used to check that the Angiomax was therapeutic. A pro-water wire was placed across the area disease.  A 2.5 x 12 balloon was used to predilate. A 2.5 x 27 drug-eluting stent was deployed. The stent was postdilated with a 3.0 x 15 noncompliant balloon.  Intracoronary nitroglycerin was given.   During the entire procedure, the patient was having chest pain. This discomfort started before the diagnostic catheterization. With sedation, she would fall asleep and be snoring heavily. As soon as she would wake up, she reports severe chest discomfort. She had TIMI-3 flow throughout the procedure. There were no ECG changes noted on the monitor.  IV nitroglycerin was started due to elevated blood pressure.  IMPRESSIONS:  1. Widely patent left main coronary artery. 2. Mild disease in the left anterior descending artery and its branches. 3. Mild to moderate disease in the small left circumflex artery and its branches. 4. Severe, 90% stenosis in the distal right coronary artery.  This was successfully treated with a 2.5 x 20 Synergy drug-eluting stent, postdilated to greater than 3 mm in diameter.  There was an excellent angiographic result with no residual stenosis. TIMI-3 flow was maintained throughout. 5. Normal left ventricular systolic function.  LVEDP 28 mmHg.  Ejection fraction 60%.  RECOMMENDATION:  Continue dual antiplatelet therapy for at least a year. He discussed this prior to the case. She is willing to take it for a year. She did not want to take  lifelong clopidogrel. She'll need aggressive secondary prevention including smoking cessation.  Given the persistent chest discomfort, we'll watch her in stepdown.  She requested to follow-up close to [facility] since that's where she lives.

## 2014-06-15 NOTE — Progress Notes (Signed)
TR BAND REMOVAL  LOCATION:    right radial  DEFLATED PER PROTOCOL:    Yes.    TIME BAND OFF / DRESSING APPLIED:    1500   SITE UPON ARRIVAL:    Level 0  SITE AFTER BAND REMOVAL:    Level 0   CIRCULATION SENSATION AND MOVEMENT:    Within Normal Limits   Yes.    COMMENTS:   Rechecked at 1530 with no change noted dsg dry and intact.

## 2014-06-15 NOTE — Progress Notes (Deleted)
UR completed 

## 2014-06-15 NOTE — Progress Notes (Signed)
Angiomax turned off.

## 2014-06-15 NOTE — Interval H&P Note (Signed)
Cath Lab Visit (complete for each Cath Lab visit)  Clinical Evaluation Leading to the Procedure:   ACS: Yes.    Non-ACS:    Anginal Classification: CCS IV  Anti-ischemic medical therapy: Minimal Therapy (1 class of medications)  Non-Invasive Test Results: No non-invasive testing performed  Prior CABG: No previous CABG   TIMI SCORE  Patient Information:  TIMI Score is 3  UA/NSTEMI and intermediate-risk features (e.g., TIMI score 3?4) for short-term risk of death or nonfatal MI  Revascularization of the presumed culprit artery   A (9)  Indication: 10; Score: 9    History and Physical Interval Note:  06/15/2014 7:55 AM  Jennifer [name]  has presented today for surgery, with the diagnosis of cp  The various methods of treatment have been discussed with the patient and family. After consideration of risks, benefits and other options for treatment, the patient has consented to  Procedure(s): LEFT HEART CATHETERIZATION WITH CORONARY ANGIOGRAM (N/A) as a surgical intervention .  The patient's history has been reviewed, patient examined, no change in status, stable for surgery.  I have reviewed the patient's chart and labs.  Questions were answered to the patient's satisfaction.     VARANASI,JAYADEEP S.

## 2014-06-15 NOTE — Progress Notes (Signed)
UR completed 

## 2014-06-15 NOTE — H&P (View-Only) (Signed)
CONSULT NOTE  Date: 06/14/2014               Patient Name:  Jennifer SenegalLisa [name] MRN: [id number]  DOB: [birth year] Age / Sex: 54 y.o., female        PCP: No PCP Per Patient Primary Cardiologist: Tawni MillersBaker, [facility] [place]            Referring Physician: Dhungel              Reason for Consult: Chest discomfort           History of Present Illness: Patient is a 54 y.o. female with a PMHx of CAD ( s/p placement of 2 stents) , DM, HTN, smoker, hyperlipidemia, bipolar disease , who [facility] and [facility]2014 for evaluation of chest discomfort.   The pain started last Friday ( 5 days ago). intermittant CP, radiation down both arms.  Last for 3-5 minutes.   Worse with walking around and associated with Nausea and headache.  Associated with dyspnea. Radiates to neck and jaw.  Burning, through to back, band like sensation She has chest discomfort with any amount of exertion   Has had URI symptoms - coughing. Some vomitting .   Has had lots of stress over the past year - family , health  issues, mental issues,   Smoker  - 1/2 - 1 ppd.  Sometimes more,  Less recently with e-cig.   + family hx      Medications: Outpatient medications: Prescriptions prior to admission  Medication Sig Dispense Refill Last Dose  . ALPRAZolam (XANAX PO) Take 1 tablet by mouth 3 (three) times daily as needed.   06/09/2014  . carisoprodol (SOMA) 350 MG tablet Take 350 mg by mouth 3 (three) times daily as needed for muscle spasms.   06/13/2014 at Unknown time  . gabapentin (NEURONTIN) 300 MG capsule Take 300 mg by mouth 3 (three) times daily as needed.   06/13/2014 at Unknown time  . ibuprofen (ADVIL,MOTRIN) 200 MG tablet Take 800 mg by mouth 4 (four) times daily as needed for moderate pain.   06/13/2014 at Unknown time  . lisinopril (PRINIVIL,ZESTRIL) 40 MG tablet Take 40 mg by mouth daily.   06/13/2014 at Unknown time  . oxycodone (OXY-IR) 5 MG capsule Take 5 mg by mouth every  4 (four) hours as needed for pain.   06/08/2014  . PRESCRIPTION MEDICATION Take 200 mg by mouth daily. Take 200 mg metoprolol daily.   06/13/2014 at 12 pm    Current medications: Current Facility-Administered Medications  Medication Dose Route Frequency Provider Last Rate Last Dose  . 0.9 %  sodium chloride infusion   Intravenous Continuous Tora KindredMarianne L York, PA-C      . acetaminophen (TYLENOL) tablet 650 mg  650 mg Oral Q4H PRN Nishant Dhungel, MD      . ALPRAZolam Prudy Feeler(XANAX) tablet 1 mg  1 mg Oral TID PRN Stephani PoliceMarianne L York, PA-C      . aspirin EC tablet 325 mg  325 mg Oral Daily Nishant Dhungel, MD      . atorvastatin (LIPITOR) tablet 40 mg  40 mg Oral q1800 Marianne L York, PA-C      . azithromycin (ZITHROMAX) tablet 500 mg  500 mg Oral Daily Stephani PoliceMarianne L York, PA-C       Followed by  . [START ON 06/15/2014] azithromycin (ZITHROMAX) tablet 250 mg  250 mg Oral Daily Stephani PoliceMarianne L York, PA-C      .  calcium carbonate (TUMS - dosed in mg elemental calcium) chewable tablet 200 mg of elemental calcium  1 tablet Oral BID WC Stephani Police, PA-C      . carisoprodol (SOMA) tablet 350 mg  350 mg Oral TID Stephani Police, PA-C      . diphenhydrAMINE (BENADRYL) capsule 50 mg  50 mg Oral Q6H PRN Tora Kindred York, PA-C      . enoxaparin (LOVENOX) injection 40 mg  40 mg Subcutaneous Q24H Nishant Dhungel, MD      . fluticasone (FLONASE) 50 MCG/ACT nasal spray 2 spray  2 spray Each Nare Daily Marianne L York, PA-C      . gabapentin (NEURONTIN) capsule 300 mg  300 mg Oral TID PC & HS Marianne L York, PA-C      . guaiFENesin (MUCINEX) 12 hr tablet 1,200 mg  1,200 mg Oral BID Stephani Police, PA-C      . insulin aspart (novoLOG) injection 0-9 Units  0-9 Units Subcutaneous 6 times per day Stephani Police, PA-C      . insulin glargine (LANTUS) injection 5 Units  5 Units Subcutaneous QHS Stephani Police, PA-C      . levalbuterol (XOPENEX) nebulizer solution 0.63 mg  0.63 mg Nebulization Q8H Marianne L York, PA-C      .  lisinopril (PRINIVIL,ZESTRIL) tablet 10 mg  10 mg Oral Daily Marianne L York, PA-C      . loratadine (CLARITIN) tablet 10 mg  10 mg Oral Daily Tora Kindred York, PA-C      . metoprolol tartrate (LOPRESSOR) tablet 25 mg  25 mg Oral BID Nishant Dhungel, MD      . nicotine (NICODERM CQ - dosed in mg/24 hours) patch 21 mg  21 mg Transdermal Daily Marianne L York, PA-C      . ondansetron (ZOFRAN) injection 4 mg  4 mg Intravenous Q6H PRN Tora Kindred York, PA-C      . ondansetron (ZOFRAN) tablet 4 mg  4 mg Oral Q8H PRN Nishant Dhungel, MD      . oxyCODONE (Oxy IR/ROXICODONE) immediate release tablet 5 mg  5 mg Oral Q6H PRN Stephani Police, PA-C      . oxymetazoline (AFRIN) 0.05 % nasal spray 1 spray  1 spray Each Nare BID PRN Stephani Police, PA-C      . pantoprazole (PROTONIX) EC tablet 40 mg  40 mg Oral Daily Marianne L York, PA-C      . tamsulosin (FLOMAX) capsule 0.4 mg  0.4 mg Oral QPC supper Stephani Police, PA-C         Allergies  Allergen Reactions  . Lithium Other (See Comments)    Blood dyscrasia  . Tegretol [Carbamazepine] Other (See Comments)    Blood dyscrasia when on with Lithium  . Lodine [Etodolac] Other (See Comments)    Rash, N/V     No past medical history on file.  No past surgical history on file.  No family history on file.  Social History:  has no tobacco, alcohol, and drug history on file.   Review of Systems: Constitutional:  denies fever, chills, diaphoresis, appetite change and fatigue.  HEENT: denies photophobia, eye pain, redness, hearing loss, ear pain, congestion, sore throat, rhinorrhea, sneezing, neck pain, neck stiffness and tinnitus.  Respiratory: admits to SOB, DOE, cough, chest tightness, and wheezing.  Cardiovascular: admits to chest pain,  Denies palpitations and leg swelling.  Gastrointestinal: admits to nausea, vomiting,    Genitourinary: denies dysuria, urgency,  frequency, hematuria, flank pain and difficulty urinating.  Musculoskeletal: denies   myalgias, back pain, joint swelling, arthralgias and gait problem.   Skin: denies pallor, rash and wound.  Neurological: denies dizziness, seizures, syncope, weakness, light-headedness, numbness and headaches.   Hematological: denies adenopathy, easy bruising, personal or family bleeding history.  Psychiatric/ Behavioral: denies suicidal ideation, mood changes, confusion, nervousness, sleep disturbance and agitation.    Physical Exam: BP 138/69 mmHg  Pulse 62  Temp(Src) 98 F (36.7 C) (Oral)  Resp 16  Ht  (1.575 m)  Wt 183 lb 10.3 oz (83.3 kg)  BMI 33.58 kg/m2  SpO2 97%  Wt Readings from Last 3 Encounters:  06/14/14 183 lb 10.3 oz (83.3 kg)    General: Vital signs reviewed and noted. Well-developed, well-nourished, in no acute distress; alert,   Head: Normocephalic, atraumatic, sclera anicteric,   Neck: Supple. Negative for carotid bruits. No JVD   Lungs:  Clear bilaterally, no  wheezes, rales, or rhonchi. Breathing is normal   Heart: RRR with S1 S2. No murmurs, rubs, or gallops   Abdomen/ GI :  Soft, non-tender, non-distended with normoactive bowel sounds. No hepatomegaly. No rebound/guarding. No obvious abdominal masses   MSK: Strength and the appear normal for age.   Extremities: No clubbing or cyanosis. No edema.  Distal pedal pulses are 2+ and equal   Neurologic:  CN are grossly intact,  No obvious motor or sensory defect.  Alert and oriented X 3. Moves all extremities spontaneously.  Psych: Responds to questions appropriately with a normal affect.     Lab results: Basic Metabolic Panel: No results for input(s): NA, K, CL, CO2, GLUCOSE, BUN, CREATININE, CALCIUM, MG, PHOS in the last 168 hours.  Liver Function Tests: No results for input(s): AST, ALT, ALKPHOS, BILITOT, PROT, ALBUMIN in the last 168 hours. No results for input(s): LIPASE, AMYLASE in the last 168 hours. No results for input(s): AMMONIA in the last 168 hours.  CBC:  Recent Labs Lab 06/14/14 0835    WBC 10.6*  HGB 13.3  HCT 40.2  MCV 92.4  PLT 280    Cardiac Enzymes: No results for input(s): CKTOTAL, CKMB, CKMBINDEX, TROPONINI in the last 168 hours.  BNP: Invalid input(s): POCBNP  CBG:  Recent Labs Lab 06/14/14 0628  GLUCAP 153*    Coagulation Studies: No results for input(s): LABPROT, INR in the last 72 hours.   Other results: Personal review of EKG shows :  - has been ordered   Imaging:  No results found.       Assessment & Plan: 1. Unstable angina:  Jennifer [name] presents with symptoms of unstable angina.  She has severe CP walking 20-30 feet.  Associated with dyspnea, jaw pain, severe dyspnea. She has knows CAD and has 2 stents -   Will schedule her for cardiac cath today.  With her CP with each time that she walks, I'm not sure that a myoview would be very helpful.  2. Smoking hx :  Advised her to stop smoking  3.  Diabetes mellitus:  Continue current plans per Int. Med team.   4.  Essential Hypertension:  BP is ok.  Continue current plans       Vesta Mixer, Montez Hageman., MD, [facility] 06/14/2014, 9:10 AM Office - [phone] Pager [phone]

## 2014-06-15 NOTE — Progress Notes (Addendum)
[facility] PROGRESS NOTE  Jennifer SenegalLisa [name] ZOX:[id number] DOB: [birth year] DOA: 06/14/2014 PCP: No PCP Per Patient  Assessment/Plan: Chest pain with unstable angina pectoris -neg troponin -will continue ASA, b-blocker, statins -status post PCI -will follow cardiology rec's -will need dual platelet therapy, is on plavix and ASA  HTN -Continue Metoprolol and Lisinopril -will use PRN hydralazine and once off nitro drip if needed will add BIDIL  HLD -Continue Statin.  Diabetes mellitus -while inpatient will continue SSI and Carb modified diet -A1C 7.9 (meand blood glucose of 180) -at discharge will need oral hypoglycemic regimen (amaryl vs metformin)  Acute bronchitis -continue Treatment with Zpack.  -continue Supportive care including Flonase, Claritin, nebulizers.  Tobacco abuse  -Counseled to stop.  -Transdermal nicotine daily ordered.  GERD -continue PPI therapy (will increase to BID)  Chronic pain -hx of Previous shoulder and knee surgeries. -Continue Soma, gabapentin and PRN oxycodone -patient orders for IV morphine for severe pain  Bipolar DO. -no SI -Continue Xanax.  Code Status: Full Family Communication: no family at bedside Disposition Plan: home when medically stable   Consultants:  Cardiology   Procedures:  Left heart cath: 06/15/14 1. Widely patent left main coronary artery. 2. Mild disease in the left anterior descending artery and its branches. 3. Mild to moderate disease in the small left circumflex artery and its branches. 4. Severe, 90% stenosis in the distal right coronary artery. This was successfully treated with a 2.5 x 20 Synergy drug-eluting stent, postdilated to greater than 3 mm in diameter. There was an excellent angiographic result with no residual stenosis. TIMI-3 flow was maintained throughout. 5. Normal left ventricular systolic function. LVEDP 28 mmHg. Ejection fraction 60%.   Antibiotics:  None    HPI/Subjective: Still complaining of pain and with BP sub-optimal control. No nausea or vomiting. Tolerating diet.  Objective: Filed Vitals:   06/15/14 1600  BP: 126/55  Pulse: 68  Temp:   Resp: 16    Intake/Output Summary (Last 24 hours) at 06/15/14 1733 Last data filed at 06/15/14 1549  Gross per 24 hour  Intake   1620 ml  Output   1850 ml  Net   -230 ml   Filed Weights   06/14/14 [id number]  Weight: 83.3 kg (183 lb 10.3 oz)    Exam:   General:  Patient denies SOB, still complaining of pain (mainly in her back, neck and shoulder); also with reproducible pain to palpation in her chest. No nausea or vomiting.  Cardiovascular: S1 and S2, no rubs or rgallops  Respiratory: no wheezing, no rales; good air movement  Abdomen: soft, mild tenderness to palpation in epigastric area, no guarding; positive BS  Musculoskeletal: no edema, cyanosis or clubbing  Data Reviewed: Basic Metabolic Panel:  Recent Labs Lab 06/14/14 0835  NA 139  K 3.8  CL 104  CO2 25  GLUCOSE 169*  BUN 18  CREATININE 0.68  0.65  CALCIUM 9.2   Liver Function Tests:  Recent Labs Lab 06/14/14 0835  AST 14  ALT 15  ALKPHOS 71  BILITOT 0.5  PROT 6.4  ALBUMIN 3.8    Recent Labs Lab 06/14/14 0835  LIPASE 371*   CBC:  Recent Labs Lab 06/14/14 0835  WBC 10.6*  HGB 13.3  HCT 40.2  MCV 92.4  PLT 280   Cardiac Enzymes:  Recent Labs Lab 06/14/14 0835 06/14/14 1321 06/14/14 1936  TROPONINI <0.03 <0.03 <0.03   CBG:  Recent Labs Lab 06/14/14 1641 06/14/14 2044 06/15/14 0003 06/15/14 1318 06/15/14 1720  GLUCAP 124* 159* 109* 218* 106*    Studies: No results found.  Scheduled Meds: . [START ON 06/16/2014] aspirin  81 mg Oral Daily  . atorvastatin  40 mg Oral q1800  . azithromycin  250 mg Oral Daily  . calcium carbonate  1 tablet Oral BID WC  . [START ON 06/16/2014] clopidogrel  75 mg Oral Q breakfast  . [START ON 06/16/2014] enoxaparin (LOVENOX) injection  40 mg  Subcutaneous Q24H  . fluticasone  2 spray Each Nare Daily  . gabapentin  300 mg Oral TID PC & HS  . guaiFENesin  1,200 mg Oral BID  . insulin aspart  0-9 Units Subcutaneous 6 times per day  . insulin glargine  5 Units Subcutaneous QHS  . levalbuterol  0.63 mg Nebulization BID  . lisinopril  20 mg Oral Daily  . loratadine  10 mg Oral Daily  . metoprolol tartrate  100 mg Oral BID  . nicotine  21 mg Transdermal Daily  . pantoprazole  40 mg Oral Daily  . tamsulosin  0.4 mg Oral QPC supper  . venlafaxine  75 mg Oral BID   Continuous Infusions: . bivalirudin (ANGIOMAX) infusion 5 mg/mL (Cath Lab,ACS,PCI indication)      Principal Problem:   Chest pain at rest Active Problems:   Diabetes   Acute bronchitis   Tobacco abuse   Essential hypertension   GERD (gastroesophageal reflux disease)   Bipolar disorder   Chest pain   Unstable angina    Time spent: 30 minutes    Vassie Loll  [facility] Pager [phone] If 7PM-7AM, please contact night-coverage at [email or web address], password [facility] 06/15/2014, 5:33 PM  LOS: 1 day

## 2014-06-15 NOTE — Progress Notes (Signed)
Pt decided to sign the consent for the cath and took her 81mg  asa. Pt now states she is sorry for her behavior, but i am very skeptical after the nights events.

## 2014-06-16 DIAGNOSIS — I2510 Atherosclerotic heart disease of native coronary artery without angina pectoris: Secondary | ICD-10-CM

## 2014-06-16 DIAGNOSIS — G894 Chronic pain syndrome: Secondary | ICD-10-CM | POA: Insufficient documentation

## 2014-06-16 DIAGNOSIS — Z955 Presence of coronary angioplasty implant and graft: Secondary | ICD-10-CM | POA: Insufficient documentation

## 2014-06-16 LAB — CBC
HEMATOCRIT: 39.2 % (ref 36.0–46.0)
Hemoglobin: 12.8 g/dL (ref 12.0–15.0)
MCH: 30.6 pg (ref 26.0–34.0)
MCHC: 32.7 g/dL (ref 30.0–36.0)
MCV: 93.8 fL (ref 78.0–100.0)
PLATELETS: 271 10*3/uL (ref 150–400)
RBC: 4.18 MIL/uL (ref 3.87–5.11)
RDW: 13.5 % (ref 11.5–15.5)
WBC: 9.3 10*3/uL (ref 4.0–10.5)

## 2014-06-16 LAB — BASIC METABOLIC PANEL
ANION GAP: 3 — AB (ref 5–15)
BUN: 16 mg/dL (ref 6–23)
CHLORIDE: 103 mmol/L (ref 96–112)
CO2: 29 mmol/L (ref 19–32)
CREATININE: 0.68 mg/dL (ref 0.50–1.10)
Calcium: 8.8 mg/dL (ref 8.4–10.5)
GFR calc non Af Amer: >90 mL/min (ref >90)
Glucose, Bld: 150 mg/dL — ABNORMAL HIGH (ref 70–99)
Potassium: 4.9 mmol/L (ref 3.5–5.1)
SODIUM: 135 mmol/L (ref 135–145)

## 2014-06-16 LAB — GLUCOSE, CAPILLARY
Glucose-Capillary: 115 mg/dL — ABNORMAL HIGH (ref 70–99)
Glucose-Capillary: 120 mg/dL — ABNORMAL HIGH (ref 70–99)

## 2014-06-16 MED ORDER — GLIMEPIRIDE 2 MG PO TABS: 2.0000 mg | ORAL_TABLET | Freq: Every day | ORAL | Status: DC

## 2014-06-16 MED ORDER — ATORVASTATIN CALCIUM 40 MG PO TABS: 40.0000 mg | ORAL_TABLET | Freq: Every day | ORAL | Status: DC

## 2014-06-16 MED ORDER — CLOPIDOGREL BISULFATE 75 MG PO TABS: 75.0000 mg | ORAL_TABLET | Freq: Every day | ORAL | Status: DC

## 2014-06-16 MED ORDER — NICOTINE 21 MG/24HR TD PT24: 21.0000 mg | MEDICATED_PATCH | Freq: Every day | TRANSDERMAL | Status: DC

## 2014-06-16 MED ORDER — ISOSORBIDE MONONITRATE ER 30 MG PO TB24: 15.0000 mg | ORAL_TABLET | Freq: Every day | ORAL | Status: DC

## 2014-06-16 MED ORDER — AZITHROMYCIN 250 MG PO TABS: ORAL_TABLET | ORAL | Status: DC

## 2014-06-16 MED ORDER — PANTOPRAZOLE SODIUM 40 MG PO TBEC: 40.0000 mg | DELAYED_RELEASE_TABLET | Freq: Two times a day (BID) | ORAL | Status: DC

## 2014-06-16 MED ORDER — ISOSORBIDE MONONITRATE 15 MG HALF TABLET
15.0000 mg | ORAL_TABLET | Freq: Every day | ORAL | Status: DC
  Administered 2014-06-16: 13:00:00 15 mg via ORAL
  Filled 2014-06-16 (×2): qty 1

## 2014-06-16 MED ORDER — ASPIRIN 81 MG PO CHEW: 81.0000 mg | CHEWABLE_TABLET | Freq: Every day | ORAL | Status: DC

## 2014-06-16 MED ORDER — LIVING WELL WITH DIABETES BOOK
Freq: Once | Status: AC
  Administered 2014-06-16: 14:00:00
  Filled 2014-06-16: qty 1

## 2014-06-16 MED ORDER — GLIMEPIRIDE 2 MG PO TABS
2.0000 mg | ORAL_TABLET | Freq: Every day | ORAL | Status: DC
  Filled 2014-06-16: qty 1

## 2014-06-16 MED ORDER — BLOOD GLUCOSE MONITOR KIT: PACK | Status: DC

## 2014-06-16 MED FILL — Sodium Chloride IV Soln 0.9%: INTRAVENOUS | Qty: 50 | Status: AC

## 2014-06-16 NOTE — Clinical Social Work Note (Signed)
Patient advised CSW and CSW Intern on 2/2 that she did not have transport to get to her vehicle at discharge. Patient medically stable for discharge today. CSW visited again with patient today and confirmed that she still does not have transport to [facility] to get her vehicle. Ms. Jennifer [name] provided with taxi voucher (given to nurse) to get to her vehicle. Patient expressed appreciation for CSW's assistance.   Genelle BalVanessa Michaeline Eckersley, MSW, LCSW Licensed Clinical Social Worker Clinical Social Work Department [facility] [phone]

## 2014-06-16 NOTE — Progress Notes (Signed)
Subjective: Chest a little sore but not like before.  Objective: Vital signs in last 24 hours: Temp:  [97.8 F (36.6 C)-98 F (36.7 C)] 97.8 F (36.6 C) (02/26 0443) Pulse Rate:  [58-82] 63 (02/26 0443) Resp:  [14-16] 16 (02/26 0443) BP: (126-177)/(55-124) 136/71 mmHg (02/26 0443) SpO2:  [97 %-100 %] 100 % (02/26 0443) Weight:  [186 lb 8.2 oz (84.6 kg)] 186 lb 8.2 oz (84.6 kg) (02/26 0443) Last BM Date: 06/13/14  Intake/Output from previous day: 02/25 0701 - 02/26 0700 In: 753.2 [P.O.:420; I.V.:333.2] Out: 4000 [Urine:4000] Intake/Output this shift: Total I/O In: -  Out: 2600 [Urine:2600]  Medications Current Facility-Administered Medications  Medication Dose Route Frequency Provider Last Rate Last Dose  . acetaminophen (TYLENOL) tablet 650 mg  650 mg Oral Q4H PRN Nishant Dhungel, MD      . ALPRAZolam Prudy Feeler(XANAX) tablet 0.5 mg  0.5 mg Oral TID PRN Corky CraftsJayadeep S Varanasi, MD   0.5 mg at 06/15/14 2104  . aspirin chewable tablet 81 mg  81 mg Oral Daily Corky CraftsJayadeep S Varanasi, MD      . atorvastatin (LIPITOR) tablet 40 mg  40 mg Oral q1800 Stephani PoliceMarianne L York, PA-C   40 mg at 06/15/14 1825  . azithromycin (ZITHROMAX) tablet 250 mg  250 mg Oral Daily Stephani PoliceMarianne L York, PA-C   250 mg at 06/15/14 1447  . calcium carbonate (TUMS - dosed in mg elemental calcium) chewable tablet 200 mg of elemental calcium  1 tablet Oral BID WC Marianne L York, PA-C   200 mg of elemental calcium at 06/15/14 1800  . carisoprodol (SOMA) tablet 350 mg  350 mg Oral TID PRN Corky CraftsJayadeep S Varanasi, MD   350 mg at 06/15/14 2104  . clopidogrel (PLAVIX) tablet 75 mg  75 mg Oral Q breakfast Corky CraftsJayadeep S Varanasi, MD      . diphenhydrAMINE (BENADRYL) capsule 50 mg  50 mg Oral Q6H PRN Stephani PoliceMarianne L York, PA-C      . enoxaparin (LOVENOX) injection 40 mg  40 mg Subcutaneous Q24H Corky CraftsJayadeep S Varanasi, MD      . fluticasone [facility](FLONASE) 50 MCG/ACT nasal spray 2 spray  2 spray Each Nare Daily Stephani PoliceMarianne L York, PA-C   2 spray at 06/14/14 1050    . gabapentin (NEURONTIN) capsule 300 mg  300 mg Oral TID PC & HS Stephani PoliceMarianne L York, PA-C   300 mg at 06/15/14 2105  . guaiFENesin (MUCINEX) 12 hr tablet 1,200 mg  1,200 mg Oral BID Stephani PoliceMarianne L York, PA-C   1,200 mg at 06/15/14 2106  . hydrALAZINE (APRESOLINE) injection 10 mg  10 mg Intravenous Q8H PRN Vassie Lollarlos Madera, MD      . insulin aspart (novoLOG) injection 0-9 Units  0-9 Units Subcutaneous TID AC & HS Vassie Lollarlos Madera, MD   2 Units at 06/15/14 2110  . insulin glargine (LANTUS) injection 5 Units  5 Units Subcutaneous QHS Stephani PoliceMarianne L York, PA-C   5 Units at 06/15/14 2110  . levalbuterol (XOPENEX) nebulizer solution 0.63 mg  0.63 mg Nebulization BID Nishant Dhungel, MD   0.63 mg at 06/15/14 2108  . lisinopril (PRINIVIL,ZESTRIL) tablet 20 mg  20 mg Oral Daily Vassie Lollarlos Madera, MD   20 mg at 06/15/14 1414  . loratadine (CLARITIN) tablet 10 mg  10 mg Oral Daily Stephani PoliceMarianne L York, PA-C   10 mg at 06/14/14 1042  . metoprolol tartrate (LOPRESSOR) tablet 100 mg  100 mg Oral BID Vassie Lollarlos Madera, MD   100 mg at 06/15/14 2104  .  morphine 2 MG/ML injection 2 mg  2 mg Intravenous Q4H PRN Corky Crafts, MD      . nicotine (NICODERM CQ - dosed in mg/24 hours) patch 21 mg  21 mg Transdermal Daily Stephani Police, PA-C   21 mg at 06/15/14 1414  . ondansetron (ZOFRAN) injection 4 mg  4 mg Intravenous Q6H PRN Stephani Police, PA-C   4 mg at 06/16/14 0219  . ondansetron (ZOFRAN) tablet 4 mg  4 mg Oral Q8H PRN Nishant Dhungel, MD   4 mg at 06/15/14 0320  . oxyCODONE (Oxy IR/ROXICODONE) immediate release tablet 5 mg  5 mg Oral Q4H PRN Corky Crafts, MD   5 mg at 06/16/14 0219  . oxymetazoline (AFRIN) 0.05 % nasal spray 1 spray  1 spray Each Nare BID PRN Stephani Police, PA-C      . pantoprazole (PROTONIX) EC tablet 40 mg  40 mg Oral BID Vassie Loll, MD   40 mg at 06/15/14 2105  . tamsulosin (FLOMAX) capsule 0.4 mg  0.4 mg Oral QPC supper Stephani Police, PA-C   0.4 mg at 06/14/14 1748  . venlafaxine (EFFEXOR) tablet  75 mg  75 mg Oral BID Corky Crafts, MD   75 mg at 06/15/14 2105    PE: General appearance: alert, cooperative and no distress Lungs: mild wheeze right and rhonchi Heart: regular rate and rhythm, S1, S2 normal, no murmur, click, rub or gallop Extremities: No LEE Pulses: 2+ and symmetric Skin: Warm and dry.  Radial cath site tender.  No ecchymosis or hematoma Neurologic: Grossly normal  Lab Results:   Recent Labs  06/14/14 0835 06/16/14 0400  WBC 10.6* 9.3  HGB 13.3 12.8  HCT 40.2 39.2  PLT 280 271   BMET  Recent Labs  06/14/14 0835 06/16/14 0400  NA 139 135  K 3.8 4.9  CL 104 103  CO2 25 29  GLUCOSE 169* 150*  BUN 18 16  CREATININE 0.68  0.65 0.68  CALCIUM 9.2 8.8   PT/INR  Recent Labs  06/14/14 1321  LABPROT 12.5  INR 0.92    Assessment/Plan  Principal Problem:   Chest pain at rest Active Problems:   Diabetes   Acute bronchitis   Tobacco abuse   Essential hypertension   GERD (gastroesophageal reflux disease)   Bipolar disorder   Chest pain   Unstable angina  SP LHC revealing widely patent left main coronary artery.  Mild disease in the left anterior descending artery and its branches.  Mild to moderate disease in the small left circumflex artery and its branches.  Severe, 90% stenosis in the distal right coronary artery. This was successfully treated with a 2.5 x 20 Synergy drug-eluting stent.  Normal left ventricular systolic function. LVEDP 28 mmHg. Ejection fraction 60%.  ASA, plavix for a year.  Lipitor, lisinopril 20, lopressor 100 bid.   Ambulate with CR and if she does well, DC home.    LOS: 2 days    HAGER, BRYAN PA-C 06/16/2014 6:48 AM   Patient seen and examined. Agree with assessment and plan. S/P PCI to distal RCA with Synergy stent. Elevated lipase on 2/24 at 37. For [place] today. Cardiac stable. Discussed smoking cessation and risk factor modification.   Lennette Bihari, MD, [facility] 06/16/2014 8:38 AM

## 2014-06-16 NOTE — Progress Notes (Signed)
CARDIAC REHAB PHASE I   PRE:  Rate/Rhythm: 62 SR  BP:  Supine: 173/90  Sitting:   Standing:    SaO2:   MODE:  Ambulation: 500 ft   POST:  Rate/Rhythm: 86 SR  BP:  Supine: 192/101/,187/115  Sitting:   Standing:    SaO2:  0810-0908 Pt walked 500 ft without CP. Tolerated well except for elevated BP. Cardiologist and MD aware of elevated BP. Reviewed smoking cessation and gave pt fake cigarette and smoking cessation handouts. Going to try to nicotine patches. Reviewed carb counting, NTG use, importance of plavix with stent, and ex ed. Pt encouraged to attend CRP 2 to help with risk factor modification and to keep catch on BP and sugars. [facility]. Pt knows she needs to make changes in ex and diet and stated ready to begin.   Luetta Nuttingharlene Ashleyann Shoun, RN BSN  06/16/2014 9:04 AM

## 2014-06-16 NOTE — Progress Notes (Signed)
Consult Note:  Went to see patient before discharge. Patient had been diagnosed with diabetes in the past and was on Actos and Metformin at one time. Patient said she had low glucose levels when she was taking them. She also mentioned how her MD at that time told her if she kept loosing weight like she was she could come off the oral meds with her next follow up appointment. Patient never followed up. Pt stated she never slept and ate at regular intervals. I stressed the importance of eating regular meals while on the oral medication. Patient drinks coffee and water at home. Patient also said she had teenage sons that ate horribly and she couldn't afford to buy and make her own meals in addition to theirs. We spoke about having her sons eat some of what she would prepare that was more healthy due to their increased risk to develop DM in the future. Discussed with patient how and when to check her glucose with a meter. Discussed witting down glucose levels for her future PCP to adjust medications. Patient says she can't workout very easily because of spinal injury and rotator cuff injury that she is waiting to have surgery on. Patient has Medicare and Medicaid insurance and should be able to afford her medications and glucose meter supplies. Patient has a follow up appointment with Dr. D[facility]Anderson [facility] [place] on March 23rd. Went over signs, symptoms, and treatment for hypoglycemia. Discussed with patient about the Living Well with Diabetes book and what she will see in it. RN to give book to patient when it arrives from pharmacy. Patient had no further questions about diabetes at this time.  Thanks,  Christena DeemShannon Emet Rafanan RN, MSN, [facility] Inpatient Diabetes Coordinator Team Pager [phone]

## 2014-06-16 NOTE — Discharge Summary (Signed)
Physician Discharge Summary  Jennifer [name] ERD:[id number] DOB: 07/31/1960 DOA: 06/14/2014  PCP: No PCP Per Patient  Admit date: 06/14/2014 Discharge date: 06/16/2014  Time spent: >30 minutes  Recommendations for Outpatient Follow-up:  1. Reassess BP and adjust medications as needed 2. Repeat BMET to follow electrolytes and renal function 3. Follow CBG's and adjust hypoglycemic regimen as needed  Discharge Diagnoses:  Principal Problem:   Chest pain at rest Active Problems:   Diabetes   Acute bronchitis   Tobacco abuse   Essential hypertension   GERD (gastroesophageal reflux disease)   Bipolar disorder   Chest pain   Unstable angina   Discharge Condition: discharge in stable condition and with instruction to follow with cardiology as an outpatient (appointment made already). She will also establish care with a PCP.  Diet recommendation: low sodium and low carbohydrates diet  Filed Weights   06/14/14 0620 06/16/14 0443  Weight: 83.3 kg (183 lb 10.3 oz) 84.6 kg (186 lb 8.2 oz)    History of present illness:  54 y.o. female, with a past medical history of coronary artery disease (status post 2 stent placements), diabetes, hypertension, tobacco abuse, hyperlipidemia, bipolar disorder, GERD who presented to [facility] ER with complaints of intermittent chest pain that started last Friday. The pain lasts for several minutes and radiates to her neck and down her arms bilaterally. She became significantly worried when it happened resting in bed and was so severe that she had to sit up and rock. The pain is worse with movement is associated with nausea and headache. Mrs. Jennifer [name] has also been coughing a lot with congestion. She has had posttussive vomiting. She reports having no energy, and decreased exercise tolerance.  Hospital Course:  Chest pain with unstable angina pectoris -neg troponin -will continue ASA, b-blocker, statins -status post PCI -ok to discharge per cardiology  rec's -will need dual platelet therapy at least for 1 year, is on plavix and ASA  HTN -Continue Metoprolol and Lisinopril; will add imdur for better control  HLD -Continue Statin.  Diabetes mellitus -A1C 7.9 (meand blood glucose of 180) -at discharge will use amaryl and low carb diet -patient to follow with PCP for further adjustment to regimen in outpatient setting  Acute bronchitis -continue Treatment with Zpack.  -no wheezing and no cough on admission  Tobacco abuse  -Counseled to stop.  -Transdermal nicotine daily ordered.  GERD -continue PPI therapy (will increase to BID for better control)  Chronic pain syndrome -hx of Previous shoulder and knee surgeries. -Continue Soma, gabapentin and PRN oxycodone -patient to follow in outpatient setting with pain clinic if needed for further adjustment to pain meds  Bipolar DO. -no SI -Continue Xanax.  Procedures:  Left heart cath: 06/15/14 1. Widely patent left main coronary artery. 2. Mild disease in the left anterior descending artery and its branches. 3. Mild to moderate disease in the small left circumflex artery and its branches. 4. Severe, 90% stenosis in the distal right coronary artery. This was successfully treated with a 2.5 x 20 Synergy drug-eluting stent, postdilated to greater than 3 mm in diameter. There was an excellent angiographic result with no residual stenosis. TIMI-3 flow was maintained throughout. 5. Normal left ventricular systolic function. LVEDP 28 mmHg. Ejection fraction 60%.  Consultations:  Cardiology   Discharge Exam: Filed Vitals:   06/16/14 0749  BP:   Pulse:   Temp: 98.1 F (36.7 C)  Resp: 18    General: Patient denies SOB, still complaining of pain (but much  better). Also denies nausea or vomiting.  Cardiovascular: S1 and S2, no rubs or rgallops  Respiratory: no wheezing, no rales; good air movement  Abdomen: soft, mild tenderness to palpation in epigastric area, no  guarding; positive BS  Musculoskeletal: no edema, cyanosis or clubbing   Discharge Instructions   Discharge Instructions    Amb Referral to Cardiac Rehabilitation    Complete by:  As directed   Referring to [facility] Phase 2     Diet - low sodium heart healthy    Complete by:  As directed      Discharge instructions    Complete by:  As directed   Take medications as prescribed Establish care with primary care doctor and arrange follow up in 10 days Follow with cardiology service as instructed Follow a low carbohydrates and low sodium/heart healthy diet Maintain good hydration     Increase activity slowly    Complete by:  As directed           Current Discharge Medication List    START taking these medications   Details  aspirin 81 MG chewable tablet Chew 1 tablet (81 mg total) by mouth daily. Qty: 30 tablet, Refills: 1    atorvastatin (LIPITOR) 40 MG tablet Take 1 tablet (40 mg total) by mouth daily at 6 PM. Qty: 30 tablet, Refills: 1    azithromycin (ZITHROMAX) 250 MG tablet Take 1 tablet by mouth daily X 3 more days Qty: 3 tablet, Refills: 0    blood glucose meter kit and supplies KIT Dispense based on patient and insurance preference. Use up to three times daily blood sugar checks as directed. (FOR ICD-9 250.00, 250.01). Qty: 1 each, Refills: 0    clopidogrel (PLAVIX) 75 MG tablet Take 1 tablet (75 mg total) by mouth daily with breakfast. Qty: 30 tablet, Refills: 1    glimepiride (AMARYL) 2 MG tablet Take 1 tablet (2 mg total) by mouth daily with breakfast. Qty: 60 tablet, Refills: 1    isosorbide mononitrate (IMDUR) 30 MG 24 hr tablet Take 0.5 tablets (15 mg total) by mouth daily. Qty: 30 tablet, Refills: 1    nicotine (NICODERM CQ - DOSED IN MG/24 HOURS) 21 mg/24hr patch Place 1 patch (21 mg total) onto the skin daily. Qty: 28 patch, Refills: 0    pantoprazole (PROTONIX) 40 MG tablet Take 1 tablet (40 mg total) by mouth 2 (two) times daily. Qty: 60 tablet,  Refills: 1      CONTINUE these medications which have NOT CHANGED   Details  ALPRAZolam (XANAX) 0.5 MG tablet Take 0.5 mg by mouth 3 (three) times daily as needed for anxiety.    carisoprodol (SOMA) 350 MG tablet Take 350 mg by mouth 3 (three) times daily as needed for muscle spasms.    gabapentin (NEURONTIN) 300 MG capsule Take 300 mg by mouth 3 (three) times daily as needed.    lisinopril (PRINIVIL,ZESTRIL) 40 MG tablet Take 40 mg by mouth daily.    metoprolol (TOPROL-XL) 200 MG 24 hr tablet Take 200 mg by mouth daily.    oxycodone (OXY-IR) 5 MG capsule Take 5 mg by mouth every 4 (four) hours as needed for pain.    venlafaxine (EFFEXOR) 75 MG tablet Take 75 mg by mouth 2 (two) times daily.      STOP taking these medications     ibuprofen (ADVIL,MOTRIN) 200 MG tablet        Allergies  Allergen Reactions  . Lithium Other (See Comments)  Blood dyscrasia  . Tegretol [Carbamazepine] Other (See Comments)    Blood dyscrasia when on with Lithium  . Lodine [Etodolac] Other (See Comments)    Rash, N/V   Follow-up Information    Follow up with [name], MD On 06/22/2014.   Specialty:  Cardiology   Why:  11:00 AM   Contact information:   [address] [place] [phone]       The results of significant diagnostics from this hospitalization (including imaging, microbiology, ancillary and laboratory) are listed below for reference.     Labs: Basic Metabolic Panel:  Recent Labs Lab 06/14/14 0835 06/16/14 0400  NA 139 135  K 3.8 4.9  CL 104 103  CO2 25 29  GLUCOSE 169* 150*  BUN 18 16  CREATININE 0.68  0.65 0.68  CALCIUM 9.2 8.8   Liver Function Tests:  Recent Labs Lab 06/14/14 0835  AST 14  ALT 15  ALKPHOS 71  BILITOT 0.5  PROT 6.4  ALBUMIN 3.8    Recent Labs Lab 06/14/14 0835  LIPASE 371*   CBC:  Recent Labs Lab 06/14/14 0835 06/16/14 0400  WBC 10.6* 9.3  HGB 13.3 12.8  HCT 40.2 39.2  MCV 92.4 93.8  PLT 280  271   Cardiac Enzymes:  Recent Labs Lab 06/14/14 0835 06/14/14 1321 06/14/14 1936  TROPONINI <0.03 <0.03 <0.03    CBG:  Recent Labs Lab 06/15/14 0003 06/15/14 1318 06/15/14 1720 06/15/14 2059 06/16/14 0636  GLUCAP 109* 218* 106* 170* 115*    Signed:  Barton Dubois  [facility] 06/16/2014, 10:42 AM

## 2014-06-16 NOTE — Care Management Note (Signed)
    Page 1 of 1   06/16/2014     5:01:05 PM CARE MANAGEMENT NOTE 06/16/2014  Patient:  Jennifer [name],Jennifer [name]   Account Number:  [id number]  Date Initiated:  06/16/2014  Documentation initiated by:  Takeem Krotzer  Subjective/Objective Assessment:   Pt adm on 06/14/14 with chest pain.     Action/Plan:   Anticipated DC Date:  06/16/2014   Anticipated DC Plan:  HOME/SELF CARE      DC Planning Services  CM consult  PCP issues      Choice offered to / List presented to:             Status of service:  Completed, signed off Medicare Important Message given?  NA - LOS <3 / Initial given by admissions (If response is "NO", the following Medicare IM given date fields will be blank) Date Medicare IM given:   Medicare IM given by:   Date Additional Medicare IM given:   Additional Medicare IM given by:    Discharge Disposition:  HOME/SELF CARE  Per UR Regulation:  Reviewed for med. necessity/level of care/duration of stay  If discussed at Long Length of Stay Meetings, dates discussed:    Comments:  06/16/14 Sidney AceJulie Reice [place], RN, BSN [phone] Pt has no PCP; she is agreeable to follow up anywhere in [place].  [facility] app[facility] ; appt information put on AVS.  Pt not interested in any [facility] follow up for diabetic teaching or med compliance, when offered.

## 2014-06-22 ENCOUNTER — Encounter: Payer: Self-pay | Admitting: Cardiovascular Disease

## 2014-06-22 ENCOUNTER — Ambulatory Visit (INDEPENDENT_AMBULATORY_CARE_PROVIDER_SITE_OTHER): Payer: Medicare Other | Admitting: Cardiovascular Disease

## 2014-06-22 VITALS — BP 152/90 | HR 66 | Ht 62.0 in | Wt 193.0 lb

## 2014-06-22 DIAGNOSIS — Z720 Tobacco use: Secondary | ICD-10-CM | POA: Diagnosis not present

## 2014-06-22 DIAGNOSIS — Z955 Presence of coronary angioplasty implant and graft: Secondary | ICD-10-CM | POA: Diagnosis not present

## 2014-06-22 DIAGNOSIS — I200 Unstable angina: Secondary | ICD-10-CM

## 2014-06-22 DIAGNOSIS — E1159 Type 2 diabetes mellitus with other circulatory complications: Secondary | ICD-10-CM

## 2014-06-22 DIAGNOSIS — I10 Essential (primary) hypertension: Secondary | ICD-10-CM

## 2014-06-22 DIAGNOSIS — E785 Hyperlipidemia, unspecified: Secondary | ICD-10-CM

## 2014-06-22 MED ORDER — ISOSORBIDE MONONITRATE ER 30 MG PO TB24: 30.0000 mg | ORAL_TABLET | Freq: Every day | ORAL | Status: DC

## 2014-06-22 NOTE — Assessment & Plan Note (Signed)
Recommended that she stay on her current medications. In particular aspirin and Plavix

## 2014-06-22 NOTE — Assessment & Plan Note (Signed)
Blood pressure continues to run high. Encouraged her to start her isosorbide mononitrate 30 mg daily

## 2014-06-22 NOTE — Assessment & Plan Note (Signed)
We have encouraged continued exercise, careful diet management in an effort to lose weight. 

## 2014-06-22 NOTE — Patient Instructions (Addendum)
You are doing well.  Please start isosorbide one a day for blood pressure  Please try to quit smoking  Please call us if you have new issues that need to be addressed before your next appt.  Your physician wants you to follow-up in: 6 months.  You will receive a reminder letter in the mail two months in advance. If you don't receive a letter, please call our office to schedule the follow-up appointment.

## 2014-06-22 NOTE — Progress Notes (Signed)
Patient ID: Jennifer [name], female    DOB: Jul 03, 1960, 54 y.o.   MRN: [id number]  HPI Comments: Ms. Ojala is a 54 year old woman with long history of smoking, coronary artery disease, previous stent to the mid RCA, repeat stenting to the mid RCA 2 years later who presented to the hospital with chest pain, shortness of breath, transferred to [facility] for cardiac catheterization showing severe distal RCA disease estimated at 90%, patent mid RCA stents, also with 70% mid to distal circumflex disease of a small vessel, presenting to establish care in the [place] office  In follow-up today, she reports that she is still smoking, trying to quit. She did not pick up several of her medications including limit the right, isosorbide. She reports that her blood pressure typically runs high and continues to do so since discharge. 841 systolic today per the patient is very good for her. She was previously on aspirin and Plavix, Plavix was held after shoulder surgery. She denies any further chest pain, shortness of breath since discharge.  EKG on today's visit shows normal sinus rhythm with rate 66 bpm, no significant ST or T-wave changes   Cardiac catheterization results from February 2016 are below  ANGIOGRAPHIC DATA:  The left main coronary artery is a small vessel but widely patent. The left anterior descending artery is a large vessel which reaches the apex. There is a large first diagonal which has mild atherosclerosis. This vessel branches across the lateral wall. In the ostium of the lower pole, there is a moderate stenosis. The left circumflex artery is a medium-sized vessel proximally. There is a medium-sized OM1 which has mild disease. The remainder of the AV groove circumflex has a focal 70% stenosis. This vessel is quite small, likely less than 2 mm in diameter. The right coronary artery is a very large, dominant vessel. The stents in the mid right coronary artery appear widely patent. There is  mild disease proximally. In the distal RCA, there is an area of moderate disease followed by a focal severe, 90% stenosis. The posterior descending artery is large and widely patent, with mild proximal disease. The posterior lateral artery is very large and branches across lateral wall. This is also widely patent with only mild atherosclerosis. LEFT VENTRICULOGRAM: Left ventricular angiogram was done in the 30 RAO projection and revealed normal left ventricular wall motion and systolic function with an estimated ejection fraction of 60 %. LVEDP was 28 mmHg.  PCI NARRATIVE: An Akari right 1.5 guide catheter was used since it was difficult to reach the ostium of the RCA. Angiomax was used for anticoagulation. ACT was used to check that the Angiomax was therapeutic. A pro-water wire was placed across the area disease. A 2.5 x 12 balloon was used to predilate. A 2.5 x 27 drug-eluting stent was deployed. The stent was postdilated with a 3.0 x 15 noncompliant balloon. Intracoronary nitroglycerin was given.     Allergies  Allergen Reactions  . Lithium Other (See Comments)    Blood dyscrasia  . Tegretol [Carbamazepine] Other (See Comments)    Blood dyscrasia when on with Lithium  . Lodine [Etodolac] Other (See Comments)    Rash, N/V    Outpatient Encounter Prescriptions as of 06/22/2014  Medication Sig  . ALPRAZolam (XANAX) 0.5 MG tablet Take 0.5 mg by mouth 3 (three) times daily as needed for anxiety.  [name] aspirin 81 MG chewable tablet Chew 1 tablet (81 mg total) by mouth daily.  [name] atorvastatin (LIPITOR) 40 MG tablet  Take 1 tablet (40 mg total) by mouth daily at 6 PM.  . azithromycin (ZITHROMAX) 250 MG tablet Take 1 tablet by mouth daily X 3 more days (Patient not taking: Reported on 06/22/2014)  . blood glucose meter kit and supplies KIT Dispense based on patient and insurance preference. Use up to three times daily blood sugar checks as directed. (FOR ICD-9 250.00, 250.01).  . carisoprodol (SOMA)  350 MG tablet Take 350 mg by mouth 3 (three) times daily as needed for muscle spasms.  . clopidogrel (PLAVIX) 75 MG tablet Take 1 tablet (75 mg total) by mouth daily with breakfast.  . gabapentin (NEURONTIN) 300 MG capsule Take 300 mg by mouth 3 (three) times daily as needed.  [name] glimepiride (AMARYL) 2 MG tablet Take 1 tablet (2 mg total) by mouth daily with breakfast. (Patient not taking: Reported on 06/22/2014)  . isosorbide mononitrate (IMDUR) 30 MG 24 hr tablet Take 1 tablet (30 mg total) by mouth daily.  [name] lisinopril (PRINIVIL,ZESTRIL) 40 MG tablet Take 40 mg by mouth daily.  . metoprolol (TOPROL-XL) 200 MG 24 hr tablet Take 200 mg by mouth daily.  . nicotine (NICODERM CQ - DOSED IN MG/24 HOURS) 21 mg/24hr patch Place 1 patch (21 mg total) onto the skin daily.  [name] oxycodone (OXY-IR) 5 MG capsule Take 5 mg by mouth every 4 (four) hours as needed for pain.  . [DISCONTINUED] isosorbide mononitrate (IMDUR) 30 MG 24 hr tablet Take 0.5 tablets (15 mg total) by mouth daily. (Patient not taking: Reported on 06/22/2014)  . [DISCONTINUED] isosorbide mononitrate (IMDUR) 30 MG 24 hr tablet Take 1 tablet (30 mg total) by mouth daily.  . [DISCONTINUED] pantoprazole (PROTONIX) 40 MG tablet Take 1 tablet (40 mg total) by mouth 2 (two) times daily. (Patient not taking: Reported on 06/22/2014)  . [DISCONTINUED] venlafaxine (EFFEXOR) 75 MG tablet Take 75 mg by mouth 2 (two) times daily.    Past Medical History  Diagnosis Date  . Hypertension   . High cholesterol   . Heart murmur   . Myocardial infarction 2008; ~ 2012  . Chronic bronchitis     "get it q yr"  . Pneumonia     "get it 1-2 times/year" (06/14/2014)  . Sleep apnea     "they want me to do the lab but I haven't" (06/14/2014)  . RLS (restless legs syndrome)   . Type II diabetes mellitus   . GERD (gastroesophageal reflux disease)   . History of stomach ulcers   . Daily headache     "when my blood pressure is up" (06/14/2014)  . Migraine     "haven't  had them in a good while; did get them 1-2 times/yr" (06/14/2014)  . Seizures   . Stroke     "they say I've had several" (06/14/2014)  . Arthritis     "qwhere" (06/14/2014)  . Anxiety   . Depression   . Bipolar disorder   . Kidney stones   . Urinary, incontinence, stress female     Past Surgical History  Procedure Laterality Date  . Tonsillectomy    . Knee arthroscopy Left   . Extracorporeal shock wave lithotripsy  X 2  . Dilation and curettage of uterus    . Cesarean section  1984  . Tubal ligation    . Abdominal hysterectomy  1984    "I've got 1 ovary left"  . Coronary angioplasty with stent placement  2008; ~ 2012    "1; 1"  . Left heart  catheterization with coronary angiogram N/A 06/15/2014    Procedure: LEFT HEART CATHETERIZATION WITH CORONARY ANGIOGRAM;  Surgeon: Jettie Booze, MD;  Location[facility] CATH LAB;  Service: Cardiovascular;  Laterality: N/A;  . Percutaneous coronary stent intervention (pci-s)  06/15/2014    Procedure: PERCUTANEOUS CORONARY STENT INTERVENTION (PCI-S);  Surgeon: Jettie Booze, MD;  Location: [facility] CATH LAB;  Service: Cardiovascular;;    Social History  reports that she has been smoking Cigarettes.  She has a 52.5 pack-year smoking history. She has never used smokeless tobacco. She reports that she drinks alcohol. She reports that she uses illicit drugs (Marijuana).  Family History Family history is unknown by patient.   Review of Systems  Constitutional: Negative.   Respiratory: Negative.   Cardiovascular: Negative.   Gastrointestinal: Negative.   Musculoskeletal: Negative.   Skin: Negative.   Neurological: Negative.   Hematological: Negative.   Psychiatric/Behavioral: Negative.   All other systems reviewed and are negative.   BP 152/90 mmHg  Pulse 66  Ht _0  (1.575 m)  Wt 193 lb (87.544 kg)  BMI 35.29 kg/m2  Physical Exam  Constitutional: She is oriented to person, place, and time. She appears well-developed and  well-nourished.  HENT:  Head: Normocephalic.  Nose: Nose normal.  Mouth/Throat: Oropharynx is clear and moist.  Eyes: Conjunctivae are normal. Pupils are equal, round, and reactive to light.  Neck: Normal range of motion. Neck supple. No JVD present.  Cardiovascular: Normal rate, regular rhythm, S1 normal, S2 normal, normal heart sounds and intact distal pulses.  Exam reveals no gallop and no friction rub.   No murmur heard. Pulmonary/Chest: Effort normal and breath sounds normal. No respiratory distress. She has no wheezes. She has no rales. She exhibits no tenderness.  Abdominal: Soft. Bowel sounds are normal. She exhibits no distension. There is no tenderness.  Musculoskeletal: Normal range of motion. She exhibits no edema or tenderness.  Lymphadenopathy:    She has no cervical adenopathy.  Neurological: She is alert and oriented to person, place, and time. Coordination normal.  Skin: Skin is warm and dry. No rash noted. No erythema.  Psychiatric: She has a normal mood and affect. Her behavior is normal. Judgment and thought content normal.    Assessment and Plan  Nursing note and vitals reviewed.

## 2014-06-22 NOTE — Assessment & Plan Note (Signed)
We have encouraged her to continue to work on weaning her cigarettes and smoking cessation. She will continue to work on this and does not want any assistance with chantix.  

## 2014-06-22 NOTE — Assessment & Plan Note (Signed)
In follow-up, no further chest pain symptoms concerning for angina. Recent stent to her distal RCA. No further testing at this time

## 2014-06-22 NOTE — Assessment & Plan Note (Signed)
Encouraged her to stay on her Lipitor 40 mg daily. Goal LDL less than 70

## 2014-08-02 ENCOUNTER — Telehealth: Payer: Self-pay | Admitting: *Deleted

## 2014-08-02 ENCOUNTER — Other Ambulatory Visit: Payer: Self-pay

## 2014-08-02 MED ORDER — METOPROLOL SUCCINATE ER 200 MG PO TB24: 200.0000 mg | ORAL_TABLET | Freq: Every day | ORAL | Status: DC

## 2014-08-02 MED ORDER — ISOSORBIDE MONONITRATE ER 30 MG PO TB24: 30.0000 mg | ORAL_TABLET | Freq: Every day | ORAL | Status: DC

## 2014-08-02 MED ORDER — CLOPIDOGREL BISULFATE 75 MG PO TABS: 75.0000 mg | ORAL_TABLET | Freq: Every day | ORAL | Status: DC

## 2014-08-02 MED ORDER — ATORVASTATIN CALCIUM 40 MG PO TABS: 40.0000 mg | ORAL_TABLET | Freq: Every day | ORAL | Status: DC

## 2014-08-02 MED ORDER — LISINOPRIL 40 MG PO TABS: 40.0000 mg | ORAL_TABLET | Freq: Every day | ORAL | Status: DC

## 2014-08-02 NOTE — Telephone Encounter (Signed)
Notified patient refills sent for all cardiac medications only.

## 2014-08-02 NOTE — Telephone Encounter (Signed)
°  1. Which medications need to be refilled? Lisinopril, metoprolol..the pt needs all the heart medication refilled but forgot the names for them.   2. Which pharmacy is medication to be sent to? [facility] in [address]   3. Do they need a 30 day or 90 day supply? 30 day but with 6-8 refills  4. Would they like a call back once the medication has been sent to the pharmacy? Yes, she stated she out and would like to pick them up while she is out.

## 2014-08-12 NOTE — Discharge Summary (Signed)
PATIENT NAME:  Jennifer [name], Jennifer [name] MR#:  [id number] DATE OF BIRTH:  [birth year]  DATE OF ADMISSION:  09/30/2013 DATE OF DISCHARGE:  10/01/2013  PRESENTING COMPLAINT: Positive blood cultures.   DISCHARGE DIAGNOSES: 1.  Enterococcus septicemia. [facility]. Repeat blood cultures at [facility] negative. 2.  Enterococcus urinary tract infection.  3.  Nephrolithiasis. 4.  [facility].  5.  Hypertension.  CODE STATUS: FULL CODE.  Urine culture positive for 1000 colonies of gram-positive cocci. Blood cultures taken on 06/11, negative in 48 hours. White count on admission was 15.2. Creatinine was 1.14.  DISCHARGE MEDICATIONS: 1.  Levaquin 500 mg p.o. daily.  2.  Zofran ODT 4 mg 2 times a day as needed.  3.  Carisoprodol 350 one tablet p.o. t.i.d.  4.  Tamsulosin 0.4 mg 1 capsule once a day.  5.  Nystatin, apply to affected area 3 times a day.  6.  Metoprolol 25 mg daily.  7.  Lisinopril 10 mg daily.  8.  Xanax 1 mg 1 tablet 3 times a day as needed.  9.  Lipitor 10 mg at bedtime.  10.  Oxycodone 5 mg every 6 hours as needed.  ACTIVITY AND LIMITATIONS:  None.  [facility] your appointment with [facility]rology as per your scheduled.  BRIEF SUMMARY OF HOSPITAL COURSE:  Albertina SenegalLisa Croucher is a [age] year old Caucasian who was recently admitted for [facility], underwent right ureteral stent placement secondary to nephrolithiasis. Was discharged home, came [facility] with positive blood cultures. She was admitted with:  1.  Sepsis secondary to Enterococcus bacteremia, likely source urine. She was started on IV antibiotics and her [facility] enter[facility] was sensitive to gentamicin, Levaquin and Zosyn. She was switched to IV Levaquin, changed to p.o. Levaquin and discharged. Blood cultures over here so far have been negative. The patient was feeling better, was stable.  2.  Enterococcus bacteremia. Continue Levaquin.  3.  Nausea,  vomiting. Tolerating diet.  4.  Hypertension. Continue home medications.  5.  Deep vein thrombosis prophylaxis with heparin.  6.  The patient was recommended to follow up with [facility] as outpatient on her scheduled appointment for her nephrolithiasis.   TIME SPENT: 40 minutes.    ____________________________ Wylie HailSona A. Allena KatzPatel, MD sap:ce D: 10/03/2013 13:59:46 ET T: 10/03/2013 19:12:14 ET JOB#: [id number]  cc: Ressie Slevin A. Allena KatzPatel, MD, <Dictator> Willow OraSONA A Isobella Ascher MD ELECTRONICALLY SIGNED 10/22/2013 10:21

## 2014-08-12 NOTE — H&P (Signed)
PATIENT NAME:  Jennifer [name], Jennifer [name] MR#:  [id number] DATE OF BIRTH:  [birth year]  DATE OF ADMISSION:  09/30/2013  PRIMARY CARE PHYSICIAN:  Nonlocal.   REFERRING PHYSICIAN:  Dr. Lowella Fairy.   CHIEF COMPLAINT:  Fever, nausea, vomiting and referred by [facility] for positive blood cultures.   HISTORY OF PRESENT ILLNESS:  Jennifer [name] is a 54 year old female with history of degenerative joint disease, hypertension, hyperlipidemia, coronary artery disease status post a stent placement in 2013, was admitted to [facility] on 09/24/2013 and was discharged on 09/25/2013.  The patient was admitted for nausea, vomiting, fever, elevated white blood cell count of 18,000 and acute renal failure.  Look for the possible source of the infection.  Chest x-ray showed possible right lower lobe infiltrate.  Also concerning about meningitis as the patient received a spinal injection for degenerative joint disease and the patient was also found to have a urinary tract infection with right ureteral stone with hydronephrosis.  Urine cultures came back to be negative.  MRI of the brain and CT of the spine was obtained which did not show any obvious abnormality.  The patient was initially treated with vancomycin, Zosyn and doxycycline.  Concerning about possible tick bites; however, after the patient was found to have urinary tract infection, the patient underwent stent placement for the right ureter and recommended to follow up with urology as an outpatient for the stone removal.  The patient was discharged home with ciprofloxacin.  Since the patient went home the patient continues to have a high-grade fever, continues to have nausea, vomiting, unable to keep down any food.  The patient also has been having diarrhea, small but frequent stools.  Denies having any dysuria.  The patient complains of pain in the right and left lower quadrant.  As the patient was planning to call back [facility]; however, the patient received a call from  [facility] that the patient has 2 out of 2 blood cultures were positive for enterococcus and recommended to go to the nearest Emergency Department.  The information was obtained from the medical records obtained from [facility].  Work-up in the Emergency Department, the patient still has elevated white blood cell count of 15,000.  The patient is strongly positive for a urinary tract infection with 2+ leukocyte esterase and 47 WBC.  Considering the patient's blood cultures being positive for enterococcus and recent stent placement, the patient will be placed on vancomycin and Zosyn.  Blood cultures and urine cultures have been obtained in the Emergency Department prior to antibiotic administration.   PAST MEDICAL HISTORY: 1.  Coronary artery disease, status post stent placement.  2.  History of CVA.  3.  History of kidney stones.  4.  Gastroesophageal reflux disease.  5.  Degenerative joint disease of the spine.  6.  Bipolar disorder.  7.  Posttraumatic stress disorder.  8.  Diabetes mellitus, currently not on any medications.   PAST SURGICAL HISTORY:  1.  Cardiac stent placement.  2.  Right shoulder surgery.  3.  Left shoulder surgery.  4.  Left knee surgery.  5.  Tonsillectomy.  6.  C-sections.  7.  Hysterectomy, partial.   ALLERGIES:   1.  TEGRETOL.  2.  LITHIUM. 3.  LODINE.  4.  PAIN RELIEVER.   HOME MEDICATIONS: 1.  Xanax 1 mg 3 times a day.  2.  Oxycodone 5 mg every six hours as needed.  3.  Metoprolol 100 mg 2 times a  day.  4.  Lisinopril 40 mg once a day.  5.  Lipitor 40 mg once a day.   SOCIAL HISTORY:  Continues to smoke 1 pack a day.  Denies drinking alcohol.  Uses marijuana on a regular basis.   REVIEW OF SYSTEMS:  CONSTITUTIONAL:  Experiencing severe generalized weakness.  EYES:  No change in vision.  EARS, NOSE, THROAT:  No change in hearing.  RESPIRATORY:  Has cough, shortness of breath with mild productive sputum.  CARDIOVASCULAR:  No chest pain,  palpations.  GASTROINTESTINAL:  Has nausea, vomiting and abdominal pain as well as diarrhea.  GENITOURINARY:  No dysuria, however has urinary tract infection.  SKIN:  No rash or lesions.  MUSCULOSKELETAL:  No joint pains and aches, however the patient has history of osteoarthritis.  ENDOCRINE:  No polyuria or polydipsia.  NEUROLOGIC:  No weakness or numbness in any part of the body.   PHYSICAL EXAMINATION:  GENERAL:  This is a well-built, well-nourished, age-appropriate female lying down in the bed, not in distress.  VITAL SIGNS:  Temperature 99.3, pulse 94, blood pressure 135/74, respiratory rate of 18, oxygen saturation is 97% on room air.  HEENT:  Head normocephalic, atraumatic.  Eyes, no scleral icterus.  Conjunctivae normal.  Pupils equal and react to light.  Extraocular movements are intact.  Mucous membranes:  Mild dryness.  No pharyngeal erythema.  NECK:  Supple.  No lymphadenopathy.  No JVD.  No carotid bruit.  CHEST:  No focal tenderness.  LUNGS:  Bilaterally clear to auscultation.  HEART:  S1, S2 regular.  No murmurs are heard.  ABDOMEN:  Bowel sounds plus.  Soft.  Has tenderness in the right and left lower quadrant.  SKIN:  No rash or lesions.  MUSCULOSKELETAL:  No joint pains and aches.   LOWER EXTREMITIES:  No pedal edema.  Pulses 2+.  NEUROLOGIC:  The patient is alert, oriented to place, person, and time.  Cranial nerves II through XII intact.  Motor 5 by 5 in upper and lower extremities.   LABORATORY DATA:  Potassium 3.1.  The rest of all the values are within normal limits.   CBC:  WBC of 15.2, hemoglobin 11.1, platelet count of 410.   Urinalysis:  2+ leukocyte esterase, WBC of 47.   ASSESSMENT AND PLAN:  Jennifer [name] is a 54 year old female who was recently admitted for sepsis, most likely from urinary tract infection, acute renal failure most likely secondary to nonsteroidal anti-inflammatory drug use and dehydration comes to the Emergency Department with continued fever  and blood cultures being positive for enterococcus and nausea, vomiting.  1.  Sepsis secondary to enterococcus bacteremia.  The source, the possibility of urinary tract infection or colitis as the patient has been having nausea, vomiting and diarrhea.  Blood cultures have been obtained.  We will continue with daily cultures until the cultures are clear.  Keep the patient on vancomycin and Zosyn.  Concerning about the patient's ongoing fever, we will obtain CT abdomen and pelvis to ensure the patient's hydronephrosis is improving.  Otherwise, this could be source of the infection.  2.  Enterococcus bacteremia.  We will continue with the blood cultures.  Continue the vancomycin and Zosyn.  3.  Nausea and vomiting.  Keep the patient nothing by mouth.  Continue with intravenous fluids.  Provide antinausea medications.  4.  Urinary tract infection.  We will obtain urine cultures.  Continue with Zosyn as the patient has failed quinolones.  5.  Hypertension.  Continue with the  home medications and we will follow up.  6.  Acute renal failure.  The [facility] with a creatinine of 3.5.  However, currently has BUN of 16 and creatinine 1.4, completely resolved.  7.  Hypokalemia.  We will replace by mouth if the patient tolerates diet, otherwise we will replace by intravenous.  8.  Keep the patient on deep vein thrombosis prophylaxis with heparin.   TIME SPENT:  55 minutes.    ____________________________ Susa GriffinsPadmaja Anabell Swint, MD pv:ea D: 09/30/2013 03:42:09 ET T: 09/30/2013 04:35:51 ET JOB#: [id number]  cc: Susa GriffinsPadmaja Samyra Limb, MD, <Dictator> Susa GriffinsPADMAJA Dejah Droessler MD ELECTRONICALLY SIGNED 10/05/2013 7:17

## 2014-08-20 NOTE — H&P (Signed)
PATIENT NAME:  Jennifer [name], Jennifer [name] MR#:  [id number] DATE OF BIRTH:  Jul 13, 1960  DATE OF ADMISSION:  06/13/2014  REFERRING PHYSICIAN: Enedina Finner. Manson Passey, MD   PRIMARY CARE PRACTITIONER: Nonlocal.   ADMITTING DOCTOR: Crissie Figures, MD    CHIEF COMPLAINT: Chest pain, intermittent, ongoing for the past 5 days.  HISTORY OF PRESENT ILLNESS: A 54 year old Caucasian female with a past medical history of hypertension, coronary artery disease status post stent, diabetes mellitus type 2 diet controlled, hyperlipidemia, kidney stones, gastroesophageal reflux disease, degenerative joint disease, bipolar disorder/depression, presents to the Emergency Room with the complaints of retrosternal chest pain, which is ongoing and intermittent for the past 5 days, associated with shortness of breath with dizziness. The patient stated that she has been having this intermittent episodes of chest pain, which is retrosternal, radiating to the jaw, neck, and bilateral arms, associated with some shortness of breath and dizziness. Did have some nausea, but no vomiting, no fever, and no cough. No focal weakness or numbness. In the Emergency Room, the patient was evaluated by the ED physician and was given aspirin and sublingual nitroglycerin, and work-up revealed 1st set of troponins negative, and EKG, normal sinus rhythm with no acute ST or T changes. In view of her cardiac history with a history of stents in the past and multiple coronary artery disease risk factors, hospitalist service was consulted for further evaluation and management. The patient is admitted for observation in view of her ongoing chest pain with the significant CAD factors and a history of coronary artery disease status post stents. The patient currently is chest pain-free and denies any new complaints.   PAST MEDICAL HISTORY: 1. Hypertension.  2. Coronary artery disease, status post stent.  3. Diabetes mellitus, type II diet controlled.  4. Kidney stones.   5. Gastroesophageal esophageal reflux disease.  6. DJD, multiple sites.  7. Depression/bipolar disorder.    PAST SURGICAL HISTORY: 1. Cardiac stents.  2. Bilateral shoulder surgery.  3. Left knee surgery.  4. Tonsillectomy.  5. C-section.  6. Hysterectomy.   ALLERGIES:  1. TEGRETOL.  2. LITHIUM.  3. IODINE.   FAMILY HISTORY: Her mom died with cancer, and history of coronary artery disease and diabetes mellitus in multiple members of the family.    SOCIAL HISTORY: She lives at home. History of smoking, still smoking 1 pack per day. No history of alcohol usage. According to the old chart, history of marijuana use, on and off.   HOME MEDICATIONS:  1. Albuterol inhalation solution as needed for shortness of breath.  2. Carisoprodol 350 mg oral tablet 1 tablet 3 times a day.  3. Gabapentin 300 mg oral capsule 1 capsule 4 times a day.  4. Lipitor 40 mg 1 tablet orally once a day.  5. Lisinopril 10 mg 1 tablet orally once a day.  6. Metoprolol succinate 25 mg oral tablet extended-release 1 tablet orally once a day.  7. Oxycodone 5 mg oral tablet 1 tablet orally every 6 hours as needed for pain.  8. Tamsulosin 0.4 mg 1 capsule orally once a day.  9. Xanax 1 mg oral tablet 3 times a day as needed for anxiety.  10. Zofran ODT 4 mg oral tablet as needed for nausea or vomiting.   REVIEW OF SYSTEMS: CONSTITUTIONAL: Negative for fever or chills. No fatigue. No weakness.  EYES: Negative for blurred vision, double vision. No pain. No redness. No discharge.  EARS, NOSE, AND THROAT: Negative for tinnitus, ear pain, hearing loss, epistaxis,  nasal discharge, difficulty swallowing.   RESPIRATORY: Positive for some chronic mild cough, negative for wheezing. No dyspnea, no hemoptysis, no painful respirations.  CARDIOVASCULAR: Positive for chest pain, which is retrosternal as noted in the history of present illness. No palpitations. She does have some mild dizziness and mild shortness of breath. No  syncopal episodes. No orthopnea. No pedal edema.  GASTROINTESTINAL: Positive for some nausea, but no vomiting. No diarrhea. No constipation. No hematemesis. No melena. No rectal bleeding. History of GERD symptoms stable on daily PPI.  GENITOURINARY: Negative for dysuria, frequency, urgency, or hematuria.  HEMATOLOGIC: Negative for anemia, easy bruising, bleeding.  INTEGUMENTARY: Negative for acne, skin rash.  MUSCULOSKELETAL: Positive for DJD in multiple joints and controlled with chronic pain medications.  NEUROLOGICAL: Negative for focal weakness, numbness. No history of CVA, TIA. PSYCHIATRIC: Positive for depression. Currently not taking any medications.   PHYSICAL EXAMINATION: VITAL SIGNS: Temperature 98.8 degrees Fahrenheit, pulse rate 74 per minute, respirations 18 per minute, blood pressure 155/83, oxygen saturation 96% on room air.  GENERAL: Well-developed, well-nourished, alert, in no acute distress, comfortably resting in the bed.  HEAD: Atraumatic, normocephalic.  EYES: Pupils are equal, react to light and accommodation. No conjunctival pallor. No icterus. Extraocular movements intact.  NOSE: No drainage. No lesions.  EARS: No drainage. No external lesions. Oral cavity: No mucosal lesions. No exudates.  NECK: Supple. No JVD. No thyromegaly. No carotid bruits. Range of motion intact and within normal limits.  RESPIRATORY: Good respiratory effort. Not using accessory muscles of respiration. Bilateral vesicular breath sounds present. No rales or rhonchi.  CARDIOVASCULAR: S1, S2 regular. No murmurs, gallops, or clicks. Peripheral pulses equal at carotid, femoral, and pedal pulses, no peripheral edema.  GASTROINTESTINAL: Abdomen is soft and nontender. No hepatosplenomegaly. No masses. No rigidity. No guarding. Bowel sounds present and equal in all 4 quadrants.  GENITOURINARY: Deferred.  MUSCULOSKELETAL: No joint tenderness or effusion. Range of motion is adequate. Strength and tone equal  bilaterally.  SKIN: Inspection within normal limits.   LYMPH NODES: No cervical lymphadenopathy.  VASCULAR: Good dorsalis pedis and posterior tibial pulses.  NEUROLOGICAL: Alert, awake, and oriented x 3. Cranial nerves II through XII grossly intact. No sensory deficit. Motor strength 5/5 in both upper and lower extremities. DTRs are 2+ bilaterally and symmetrical.  BASELINE PSYCHIATRIC: Alert, awake, and oriented x 3. Judgment and insight are adequate. Memory and mood within normal limits.   LABORATORY DATA: Serum glucose 171, BNP 108. The rest of the basic metabolic panel normal. Troponin 0.02. CBC within normal limits. CHEST X-RAY: No active cardiopulmonary disease. EKG: Normal sinus rhythm with ventricular rate of 68 beats per minute. No acute ST or T changes.   ASSESSMENT AND PLAN: A 54 year old Caucasian female with a past medical history of hypertension and coronary artery disease, status post stent, diet-controlled diabetes mellitus, hyperlipidemia, history of kidney stones with gastroesophageal reflux disease, degenerative joint disease, multiple sites, depression/bipolar, presents with the complaints of internal ongoing intermittent retrosternal chest pain for the past 5 days with associated dizziness and mild shortness of breath.   1. Intermittent chest pain, ongoing for the past 5 days, which is atypical in character; however, history of coronary artery disease status post stents in the past. The patient has a significant multiple coronary artery disease risk factors; hence, rule out acute coronary syndrome. Plan: Admit to telemetry for observation. Aspirin, nitroglycerin, beta blocker, and statin, cycle cardiac enzymes, echocardiogram, and cardiology consultation requested for further evaluation.  2. Coronary artery disease, status  post stent on beta blocker, aspirin, and statin. Continue same. The rest of the plan as above.  3. Hypertension, blood pressure controlled on home medications.  Continue same.  4. Diabetes mellitus type 2 diet controlled, stable clinically. Continue same and sliding scale insulin as needed.  5. Hyperlipidemia on statin. Continue same.  6. Gastroesophageal reflux disease on proton pump inhibitor. Continue same.  7. Tobacco usage. Counseled to quit smoking. The patient not motivated to quit.  8. History of depression/bipolar disorder, stable. Monitor.  9. History of kidney stones. No acute symptoms.  10. Deep vein thrombosis prophylaxis. Subcutaneous Lovenox.  11. Gastrointestinal prophylaxis: PPI.    CODE STATUS: Full code.   TIME SPENT: 50 minutes.    ____________________________ Crissie FiguresEdavally N. Latoshia Monrroy, MD enr:mw D: 06/14/2014 03:16:00 ET T: 06/14/2014 03:29:43 ET JOB#: [id number]  cc: Crissie FiguresEdavally N. Milaya Hora, MD, <Dictator> Crissie FiguresEdavally N. Delores Edelstein, MD Crissie FiguresEDAVALLY N Travell Desaulniers MD ELECTRONICALLY SIGNED 06/15/2014 13:16

## 2014-08-25 ENCOUNTER — Telehealth: Payer: Self-pay | Admitting: *Deleted

## 2014-08-25 NOTE — Telephone Encounter (Signed)
Left message for Jennifer StanleyLisa to [facility].

## 2014-10-04 ENCOUNTER — Encounter (HOSPITAL_COMMUNITY): Payer: Self-pay | Admitting: *Deleted

## 2014-10-31 ENCOUNTER — Other Ambulatory Visit: Payer: Self-pay | Admitting: *Deleted

## 2014-10-31 MED ORDER — ATORVASTATIN CALCIUM 40 MG PO TABS: 40.0000 mg | ORAL_TABLET | Freq: Every day | ORAL | Status: DC

## 2014-11-13 ENCOUNTER — Telehealth: Payer: Self-pay

## 2014-11-13 NOTE — Telephone Encounter (Signed)
Received cardiac clearance for pt to proceed w/ molar extraction . Per Eula Listen, PA: "Pt underwent PCI/DES to distal RCA on 06/15/14.  She cannot stop aspirin or Plavix from 12 months from this date." Faxed to Gala Romney, DDS at [phone].

## 2014-11-27 ENCOUNTER — Telehealth: Payer: Self-pay | Admitting: Cardiovascular Disease

## 2014-11-27 NOTE — Telephone Encounter (Signed)
Patient needs to know if she has a stent or IVC filter and what the device name/type is for upcoming procedure.  Please call patient.

## 2014-11-27 NOTE — Telephone Encounter (Signed)
Spoke w/ pt.  Advised her that she had a Synergy drug-eluting stent placed. She is appreciative of the call and will let us know if she has any further questions or concerns.

## 2015-01-16 ENCOUNTER — Encounter: Payer: Self-pay | Admitting: Cardiovascular Disease

## 2015-01-16 ENCOUNTER — Ambulatory Visit (INDEPENDENT_AMBULATORY_CARE_PROVIDER_SITE_OTHER): Payer: Medicare Other | Admitting: Cardiovascular Disease

## 2015-01-16 VITALS — BP 100/70 | HR 78 | Ht 62.0 in | Wt 192.0 lb

## 2015-01-16 DIAGNOSIS — E785 Hyperlipidemia, unspecified: Secondary | ICD-10-CM

## 2015-01-16 DIAGNOSIS — E1165 Type 2 diabetes mellitus with hyperglycemia: Secondary | ICD-10-CM

## 2015-01-16 DIAGNOSIS — I200 Unstable angina: Secondary | ICD-10-CM

## 2015-01-16 DIAGNOSIS — Z720 Tobacco use: Secondary | ICD-10-CM

## 2015-01-16 DIAGNOSIS — I10 Essential (primary) hypertension: Secondary | ICD-10-CM | POA: Diagnosis not present

## 2015-01-16 DIAGNOSIS — R079 Chest pain, unspecified: Secondary | ICD-10-CM

## 2015-01-16 DIAGNOSIS — Z955 Presence of coronary angioplasty implant and graft: Secondary | ICD-10-CM

## 2015-01-16 DIAGNOSIS — E118 Type 2 diabetes mellitus with unspecified complications: Secondary | ICD-10-CM

## 2015-01-16 NOTE — Assessment & Plan Note (Signed)
We have recommended that she stay on aspirin and Plavix until at least February 2017. Suggested she try to find a dentist that can do her tooth extraction on Plavix

## 2015-01-16 NOTE — Assessment & Plan Note (Signed)
Blood pressure is low on today's visit. She is having headaches from isosorbide. Repeat systolic pressure 108 on my check. Recommended she hold the isosorbide for now

## 2015-01-16 NOTE — Assessment & Plan Note (Signed)
Currently with no symptoms of angina. Unable to tolerate isosorbide secondary to low blood pressure, orthostasis, headaches. We will hold isosorbide for now, continue other medications

## 2015-01-16 NOTE — Assessment & Plan Note (Signed)
We have encouraged her to continue to work on weaning her cigarettes and smoking cessation. She will continue to work on this and does not want any assistance with chantix.  

## 2015-01-16 NOTE — Progress Notes (Signed)
Patient ID: Jennifer [name], female    DOB: [birth year], 54 y.o.   MRN: [id number]  HPI Comments: Ms. Affinito is a 54 year old woman with long history of smoking, coronary artery disease, previous stent to the mid RCA, repeat stenting to the mid RCA 2 years later who presented to the hospital with chest pain, shortness of breath, transferred to [facility] February 2016 for cardiac catheterization showing severe distal RCA disease estimated at 90%, patent mid RCA stents, also with 70% mid to distal circumflex disease of a small vessel, presenting for routine follow-up of her coronary artery disease  In follow-up today, she reports that she is doing well except she needs to have tooth extraction for infection. She's been taking in about extra for several months to suppress a dental abscess. Dentist is reluctant to do the procedure on Plavix and aspirin (she sees [facility] in [place]) She denies any symptoms concerning for angina Continues to smoke 5 cigarettes per day Hemoglobin A1c 7.9  She reports that she needs neck surgery, shoulder surgery, gallbladder surgery EKG on today's visit shows normal sinus rhythm with rate 85 bpm, no significant ST or T-wave changes   In follow-up today, she reports that she is still smoking, trying to quit. She did not pick up several of her medications including limit the right, isosorbide. She reports that her blood pressure typically runs high and continues to do so since discharge. 660 systolic today per the patient is very good for her. She was previously on aspirin and Plavix, Plavix was held after shoulder surgery. She denies any further chest pain, shortness of breath since discharge.  EKG on today's visit shows normal sinus rhythm with rate 66 bpm, no significant ST or T-wave changes  Other past medical history  Cardiac catheterization results from February 2016 are below   The left main coronary artery is a small vessel but widely patent. The left  anterior descending artery is a large vessel which reaches the apex. There is a large first diagonal which has mild atherosclerosis. This vessel branches across the lateral wall. In the ostium of the lower pole, there is a moderate stenosis. The left circumflex artery is a medium-sized vessel proximally. There is a medium-sized OM1 which has mild disease. The remainder of the AV groove circumflex has a focal 70% stenosis. This vessel is quite small, likely less than 2 mm in diameter. The right coronary artery is a very large, dominant vessel. The stents in the mid right coronary artery appear widely patent. There is mild disease proximally. In the distal RCA, there is an area of moderate disease followed by a focal severe, 90% stenosis. The posterior descending artery is large and widely patent, with mild proximal disease. The posterior lateral artery is very large and branches across lateral wall. This is also widely patent with only mild atherosclerosis. LEFT VENTRICULOGRAM: Left ventricular angiogram was done in the 30 RAO projection and revealed normal left ventricular wall motion and systolic function with an estimated ejection fraction of 60 %. LVEDP was 28 mmHg.  PCI NARRATIVE: An Akari right 1.5 guide catheter was used since it was difficult to reach the ostium of the RCA. Angiomax was used for anticoagulation. ACT was used to check that the Angiomax was therapeutic. A pro-water wire was placed across the area disease. A 2.5 x 12 balloon was used to predilate. A 2.5 x 27 drug-eluting stent was deployed. The stent was postdilated with a 3.0 x 15 noncompliant balloon. Intracoronary nitroglycerin  was given.     Allergies  Allergen Reactions  . Lithium Other (See Comments)    Blood dyscrasia  . Tegretol [Carbamazepine] Other (See Comments)    Blood dyscrasia when on with Lithium  . Lodine [Etodolac] Other (See Comments)    Rash, N/V    Outpatient Encounter Prescriptions as of 01/16/2015   Medication Sig  . atorvastatin (LIPITOR) 40 MG tablet Take 1 tablet (40 mg total) by mouth daily at 6 PM.  . blood glucose meter kit and supplies KIT Dispense based on patient and insurance preference. Use up to three times daily blood sugar checks as directed. (FOR ICD-9 250.00, 250.01).  . carisoprodol (SOMA) 350 MG tablet Take 350 mg by mouth 3 (three) times daily as needed for muscle spasms.  . clopidogrel (PLAVIX) 75 MG tablet Take 1 tablet (75 mg total) by mouth daily with breakfast.  . doxycycline (VIBRAMYCIN) 100 MG capsule Take 100 mg by mouth 2 (two) times daily.   [name] gabapentin (NEURONTIN) 300 MG capsule Take 300 mg by mouth 3 (three) times daily as needed.  . isosorbide mononitrate (IMDUR) 30 MG 24 hr tablet Take 1 tablet (30 mg total) by mouth daily.  [name] lisinopril (PRINIVIL,ZESTRIL) 40 MG tablet Take 1 tablet (40 mg total) by mouth daily.  . metoprolol (TOPROL-XL) 200 MG 24 hr tablet Take 1 tablet (200 mg total) by mouth daily.  [name] oxycodone (OXY-IR) 5 MG capsule Take 5 mg by mouth every 4 (four) hours as needed for pain.  [name] POTASSIUM PO Take by mouth as needed.   . [DISCONTINUED] ALPRAZolam (XANAX) 0.5 MG tablet Take 0.5 mg by mouth 3 (three) times daily as needed for anxiety.  . [DISCONTINUED] aspirin 81 MG chewable tablet Chew 1 tablet (81 mg total) by mouth daily. (Patient not taking: Reported on 01/16/2015)  . [DISCONTINUED] azithromycin (ZITHROMAX) 250 MG tablet Take 1 tablet by mouth daily X 3 more days (Patient not taking: Reported on 06/22/2014)  . [DISCONTINUED] glimepiride (AMARYL) 2 MG tablet Take 1 tablet (2 mg total) by mouth daily with breakfast. (Patient not taking: Reported on 06/22/2014)  . [DISCONTINUED] nicotine (NICODERM CQ - DOSED IN MG/24 HOURS) 21 mg/24hr patch Place 1 patch (21 mg total) onto the skin daily. (Patient not taking: Reported on 01/16/2015)   No facility-administered encounter medications on file as of 01/16/2015.    Past Medical History  Diagnosis  Date  . Hypertension   . High cholesterol   . Heart murmur   . Myocardial infarction 2008; ~ 2012  . Chronic bronchitis     "get it q yr"  . Pneumonia     "get it 1-2 times/year" (06/14/2014)  . Sleep apnea     "they want me to do the lab but I haven't" (06/14/2014)  . RLS (restless legs syndrome)   . Type II diabetes mellitus   . GERD (gastroesophageal reflux disease)   . History of stomach ulcers   . Daily headache     "when my blood pressure is up" (06/14/2014)  . Migraine     "haven't had them in a good while; did get them 1-2 times/yr" (06/14/2014)  . Seizures   . Stroke     "they say I've had several" (06/14/2014)  . Arthritis     "qwhere" (06/14/2014)  . Anxiety   . Depression   . Bipolar disorder   . Kidney stones   . Urinary, incontinence, stress female     Past Surgical History  Procedure Laterality  Date  . Tonsillectomy    . Knee arthroscopy Left   . Extracorporeal shock wave lithotripsy  X 2  . Dilation and curettage of uterus    . Cesarean section  1984  . Tubal ligation    . Abdominal hysterectomy  1984    "I've got 1 ovary left"  . Coronary angioplasty with stent placement  2008; ~ 2012    "1; 1"  . Left heart catheterization with coronary angiogram N/A 06/15/2014    Procedure: LEFT HEART CATHETERIZATION WITH CORONARY ANGIOGRAM;  Surgeon: Jettie Booze, MD;  Location[facility] C[facility] LAB;  Service: Cardiovascular;  Laterality: N/A;  . Percutaneous coronary stent intervention (pci-s)  06/15/2014    Procedure: PERCUTANEOUS CORONARY STENT INTERVENTION (PCI-S);  Surgeon: Jettie Booze, MD;  Location: [facility] CATH LAB;  Service: Cardiovascular;;    Social History  reports that she has been smoking Cigarettes.  She has a 8.75 pack-year smoking history. She has never used smokeless tobacco. She reports that she drinks alcohol. She reports that she uses illicit drugs (Marijuana).  Family History Family history is unknown by patient.   Review of Systems   Constitutional: Negative.   Respiratory: Negative.   Cardiovascular: Negative.   Gastrointestinal: Negative.   Musculoskeletal: Negative.   Skin: Negative.   Neurological: Negative.   Hematological: Negative.   Psychiatric/Behavioral: Negative.   All other systems reviewed and are negative.   BP 100/70 mmHg  Pulse 78  Ht _0  (1.575 m)  Wt 192 lb (87.091 kg)  BMI 35.11 kg/m2  Physical Exam  Constitutional: She is oriented to person, place, and time. She appears well-developed and well-nourished.  HENT:  Head: Normocephalic.  Nose: Nose normal.  Mouth/Throat: Oropharynx is clear and moist.  Eyes: Conjunctivae are normal. Pupils are equal, round, and reactive to light.  Neck: Normal range of motion. Neck supple. No JVD present.  Cardiovascular: Normal rate, regular rhythm, S1 normal, S2 normal, normal heart sounds and intact distal pulses.  Exam reveals no gallop and no friction rub.   No murmur heard. Pulmonary/Chest: Effort normal and breath sounds normal. No respiratory distress. She has no wheezes. She has no rales. She exhibits no tenderness.  Abdominal: Soft. Bowel sounds are normal. She exhibits no distension. There is no tenderness.  Musculoskeletal: Normal range of motion. She exhibits no edema or tenderness.  Lymphadenopathy:    She has no cervical adenopathy.  Neurological: She is alert and oriented to person, place, and time. Coordination normal.  Skin: Skin is warm and dry. No rash noted. No erythema.  Psychiatric: She has a normal mood and affect. Her behavior is normal. Judgment and thought content normal.    Assessment and Plan  Nursing note and vitals reviewed.

## 2015-01-16 NOTE — Assessment & Plan Note (Signed)
We have encouraged continued exercise, careful diet management in an effort to lose weight. 

## 2015-01-16 NOTE — Patient Instructions (Addendum)
You are doing well.  Please hold the isosorbide or cut in 1/2 daily  Ok to proceed with dental procedure but would stay on asa and plavix  Please call us if you have new issues that need to be addressed before your next appt.  Your physician wants you to follow-up in: 6 months.  You will receive a reminder letter in the mail two months in advance. If you don't receive a letter, please call our office to schedule the follow-up appointment.

## 2015-01-16 NOTE — Assessment & Plan Note (Signed)
Recommend she stay on Lipitor 40 mg daily. Goal LDL less than 70

## 2015-02-19 ENCOUNTER — Emergency Department
Admission: EM | Admit: 2015-02-19 | Discharge: 2015-02-19 | Disposition: A | Discharge status: Home / Self Care | Payer: Medicare Other | Attending: Emergency Medicine | Admitting: Emergency Medicine

## 2015-02-19 ENCOUNTER — Emergency Department: Payer: Medicare Other

## 2015-02-19 ENCOUNTER — Encounter: Payer: Self-pay | Admitting: Emergency Medicine

## 2015-02-19 DIAGNOSIS — Y9289 Other specified places as the place of occurrence of the external cause: Secondary | ICD-10-CM | POA: Insufficient documentation

## 2015-02-19 DIAGNOSIS — R300 Dysuria: Secondary | ICD-10-CM | POA: Diagnosis not present

## 2015-02-19 DIAGNOSIS — S3991XA Unspecified injury of abdomen, initial encounter: Secondary | ICD-10-CM | POA: Diagnosis present

## 2015-02-19 DIAGNOSIS — X58XXXA Exposure to other specified factors, initial encounter: Secondary | ICD-10-CM | POA: Diagnosis not present

## 2015-02-19 DIAGNOSIS — E119 Type 2 diabetes mellitus without complications: Secondary | ICD-10-CM | POA: Diagnosis not present

## 2015-02-19 DIAGNOSIS — R110 Nausea: Secondary | ICD-10-CM | POA: Diagnosis not present

## 2015-02-19 DIAGNOSIS — I10 Essential (primary) hypertension: Secondary | ICD-10-CM | POA: Diagnosis not present

## 2015-02-19 DIAGNOSIS — S3141XA Laceration without foreign body of vagina and vulva, initial encounter: Secondary | ICD-10-CM | POA: Diagnosis not present

## 2015-02-19 DIAGNOSIS — Y9389 Activity, other specified: Secondary | ICD-10-CM | POA: Diagnosis not present

## 2015-02-19 DIAGNOSIS — N23 Unspecified renal colic: Secondary | ICD-10-CM | POA: Diagnosis not present

## 2015-02-19 DIAGNOSIS — S3992XA Unspecified injury of lower back, initial encounter: Secondary | ICD-10-CM | POA: Diagnosis not present

## 2015-02-19 DIAGNOSIS — F17210 Nicotine dependence, cigarettes, uncomplicated: Secondary | ICD-10-CM | POA: Insufficient documentation

## 2015-02-19 DIAGNOSIS — Y998 Other external cause status: Secondary | ICD-10-CM | POA: Insufficient documentation

## 2015-02-19 LAB — COMPREHENSIVE METABOLIC PANEL
ALBUMIN: 4.5 g/dL (ref 3.5–5.0)
ALT: 20 U/L (ref 14–54)
AST: 18 U/L (ref 15–41)
Alkaline Phosphatase: 67 U/L (ref 38–126)
Anion gap: 8 (ref 5–15)
BUN: 16 mg/dL (ref 6–20)
CHLORIDE: 109 mmol/L (ref 101–111)
CO2: 23 mmol/L (ref 22–32)
CREATININE: 0.6 mg/dL (ref 0.44–1.00)
Calcium: 9.6 mg/dL (ref 8.9–10.3)
GFR calc Af Amer: >60 mL/min (ref >60)
GFR calc non Af Amer: >60 mL/min (ref >60)
Glucose, Bld: 191 mg/dL — ABNORMAL HIGH (ref 65–99)
Potassium: 3.9 mmol/L (ref 3.5–5.1)
SODIUM: 140 mmol/L (ref 135–145)
Total Bilirubin: 0.4 mg/dL (ref 0.3–1.2)
Total Protein: 7.5 g/dL (ref 6.5–8.1)

## 2015-02-19 LAB — URINALYSIS COMPLETE WITH MICROSCOPIC (ARMC ONLY)
BACTERIA UA: NONE SEEN
BILIRUBIN URINE: NEGATIVE
Glucose, UA: NEGATIVE mg/dL
HGB URINE DIPSTICK: NEGATIVE
LEUKOCYTES UA: NEGATIVE
Nitrite: NEGATIVE
PH: 5 (ref 5.0–8.0)
Protein, ur: NEGATIVE mg/dL
Specific Gravity, Urine: 1.027 (ref 1.005–1.030)

## 2015-02-19 LAB — CBC
HEMATOCRIT: 43.4 % (ref 35.0–47.0)
Hemoglobin: 14.4 g/dL (ref 12.0–16.0)
MCH: 31.5 pg (ref 26.0–34.0)
MCHC: 33.2 g/dL (ref 32.0–36.0)
MCV: 94.8 fL (ref 80.0–100.0)
PLATELETS: 263 10*3/uL (ref 150–440)
RBC: 4.57 MIL/uL (ref 3.80–5.20)
RDW: 14.1 % (ref 11.5–14.5)
WBC: 10 10*3/uL (ref 3.6–11.0)

## 2015-02-19 LAB — LIPASE, BLOOD: LIPASE: 26 U/L (ref 11–51)

## 2015-02-19 MED ORDER — HYDROMORPHONE HCL 2 MG PO TABS: 2.0000 mg | ORAL_TABLET | Freq: Two times a day (BID) | ORAL | Status: DC | PRN

## 2015-02-19 MED ORDER — MUPIROCIN 2 % EX OINT: 1.0000 "application " | TOPICAL_OINTMENT | Freq: Two times a day (BID) | CUTANEOUS | Status: DC

## 2015-02-19 NOTE — ED Notes (Signed)
Pt to ed with c/o bilat lower back pain/flank pain.  Pt also reports nausea, denies diarrhea.

## 2015-02-19 NOTE — ED Provider Notes (Signed)
[facility] Emergency Department Provider Note     Time seen: ----------------------------------------- 2:51 PM on 02/19/2015 -----------------------------------------    I have reviewed the triage vital signs and the nursing notes.   HISTORY  Chief Complaint Flank Pain    HPI Jennifer [name] is a 54 y.o. female who presents ER with bilateral lower back and flank pain. She also reports nausea of unclear etiology. Her main complaint right now is pain when she urinates. Patient states it feels like she is urinating razor blades, she states she is not really sexually active. States she has a new boyfriend and it was too painful to have sex last night. She was seen at [place] ER recently was diagnosed with kidney stones and told she revealed to pass them easily.   Past Medical History  Diagnosis Date  . Hypertension   . High cholesterol   . Heart murmur   . Myocardial infarction (HCC) 2008; ~ 2012  . Chronic bronchitis (HCC)     "get it q yr"  . Pneumonia     "get it 1-2 times/year" (06/14/2014)  . Sleep apnea     "they want me to do the lab but I haven't" (06/14/2014)  . RLS (restless legs syndrome)   . Type II diabetes mellitus (HCC)   . GERD (gastroesophageal reflux disease)   . History of stomach ulcers   . Daily headache     "when my blood pressure is up" (06/14/2014)  . Migraine     "haven't had them in a good while; did get them 1-2 times/yr" (06/14/2014)  . Seizures (HCC)   . Stroke [facility])     "they say I've had several" (06/14/2014)  . Arthritis     "qwhere" (06/14/2014)  . Anxiety   . Depression   . Bipolar disorder (HCC)   . Kidney stones   . Urinary, incontinence, stress female     Patient Active Problem List   Diagnosis Date Noted  . Poorly controlled type 2 diabetes mellitus with complication (HCC) 01/16/2015  . Hyperlipidemia 06/22/2014  . Stented coronary artery   . Chronic pain syndrome   . Unstable angina (HCC)   . Chest  pain at rest 06/14/2014  . Diabetes (HCC) 06/14/2014  . Acute bronchitis 06/14/2014  . Tobacco abuse 06/14/2014  . Essential hypertension 06/14/2014  . GERD (gastroesophageal reflux disease) 06/14/2014  . Bipolar disorder (HCC) 06/14/2014  . Chest pain 06/14/2014    Past Surgical History  Procedure Laterality Date  . Tonsillectomy    . Knee arthroscopy Left   . Extracorporeal shock wave lithotripsy  X 2  . Dilation and curettage of uterus    . Cesarean section  1984  . Tubal ligation    . Abdominal hysterectomy  1984    "I've got 1 ovary left"  . Coronary angioplasty with stent placement  2008; ~ 2012    "1; 1"  . Left heart catheterization with coronary angiogram N/A 06/15/2014    Procedure: LEFT HEART CATHETERIZATION WITH CORONARY ANGIOGRAM;  Surgeon: Corky Crafts, MD;  Location: [facility] CATH LAB;  Service: Cardiovascular;  Laterality: N/A;  . Percutaneous coronary stent intervention (pci-s)  06/15/2014    Procedure: PERCUTANEOUS CORONARY STENT INTERVENTION (PCI-S);  Surgeon: Corky Crafts, MD;  Location: [facility] CATH LAB;  Service: Cardiovascular;;    Allergies Lithium; Tegretol; and Lodine  Social History Social History  Substance Use Topics  . Smoking status: Current Every Day Smoker -- 0.25 packs/day for 35 years  Types: Cigarettes  . Smokeless tobacco: Never Used  . Alcohol Use: Yes     Comment: 06/14/2014 "might drink twice/yr"    Review of Systems Constitutional: Negative for fever. Eyes: Negative for visual changes. ENT: Negative for sore throat. Cardiovascular: Negative for chest pain. Respiratory: Negative for shortness of breath. Gastrointestinal: Positive for abdominal pain and nausea Genitourinary: Positive for dysuria Musculoskeletal: Positive for low back pain Skin: Negative for rash. Neurological: Negative for headaches, focal weakness or numbness.  10-point ROS otherwise negative.  ____________________________________________   PHYSICAL  EXAM:  VITAL SIGNS: ED Triage Vitals  Enc Vitals Group     BP 02/19/15 1355 169/94 mmHg     Pulse Rate 02/19/15 1352 77     Resp 02/19/15 1352 20     Temp 02/19/15 1352 97.8 F (36.6 C)     Temp Source 02/19/15 1352 Oral     SpO2 02/19/15 1352 95 %     Weight --      Height --      Head Cir --      Peak Flow --      Pain Score 02/19/15 1350 8     Pain Loc --      Pain Edu? --      Excl. in GC? --     Constitutional: Alert and oriented. Well appearing and in no distress. Eyes: Conjunctivae are normal. PERRL. Normal extraocular movements. ENT   Head: Normocephalic and atraumatic.   Nose: No congestion/rhinnorhea.   Mouth/Throat: Mucous membranes are moist.   Neck: No stridor. Cardiovascular: Normal rate, regular rhythm. Normal and symmetric distal pulses are present in all extremities. No murmurs, rubs, or gallops. Respiratory: Normal respiratory effort without tachypnea nor retractions. Breath sounds are clear and equal bilaterally. No wheezes/rales/rhonchi. Gastrointestinal: Soft and nontender. No distention. No abdominal bruits.  Genitourinary: Externally patient has a small 1 cm laceration that is superficial just inferior to the urethra Musculoskeletal: Nontender with normal range of motion in all extremities. No joint effusions.  No lower extremity tenderness nor edema. Neurologic:  Normal speech and language. No gross focal neurologic deficits are appreciated. Speech is normal. No gait instability. Skin:  Skin is warm, dry and intact. No rash noted. Psychiatric: Mood and affect are normal. Speech and behavior are normal. Patient exhibits appropriate insight and judgment. ____________________________________________  ED COURSE:  Pertinent labs & imaging results that were available during my care of the patient were reviewed by me and considered in my medical decision making (see chart for details). We'll check basic labs and urine. Patient also needed  external vaginal examination. ____________________________________________    LABS (pertinent positives/negatives)  Labs Reviewed  COMPREHENSIVE METABOLIC PANEL - Abnormal; Notable for the following:    Glucose, Bld 191 (*)    All other components within normal limits  URINALYSIS COMPLETEWITH MICROSCOPIC ([facility] ONLY) - Abnormal; Notable for the following:    Color, Urine YELLOW (*)    APPearance CLEAR (*)    Ketones, ur 1+ (*)    Squamous Epithelial / LPF 6-30 (*)    All other components within normal limits  CHLAMYDIA/NGC RT PCR ([facility] ONLY)  LIPASE, BLOOD  CBC    RADIOLOGY Images were viewed by me  CT renal protocol IMPRESSION: 1. Proximal right ureteral 3 mm stone, just below the right UPJ, with no right hydronephrosis. No additional urolithiasis. 2. Right middle lobe 2 mm solid pulmonary nodule. If the patient is at high risk for bronchogenic carcinoma, follow-up chest CT at 1  year is recommended. If the patient is at low risk, no follow-up is needed. This recommendation follows the consensus statement: Guidelines for Management of Small Pulmonary Nodules Detected on CT Scans: A Statement from the Fleischner Society as published in Radiology 2005; 237:395-400. 3. Small fat containing left inguinal hernia.  ____________________________________________  FINAL ASSESSMENT AND PLAN  Dysuria, vaginal laceration, renal colic  Plan: Patient with labs and imaging as dictated above. Patient has a small suburethral laceration of unclear etiology. She'll be encouraged to apply antibiotic ointment to this. She'll also be given pain medicine for what looks like a right-sided kidney stone. She is stable for outpatient follow-up   Emily FilbertWilliams, Cataleyah Colborn E, MD   Emily FilbertJonathan E Cregg Jutte, MD 02/19/15 [phone]

## 2015-02-19 NOTE — Discharge Instructions (Signed)
Renal Colic Renal colic is pain that is caused by passing a kidney stone. The pain can be sharp and severe. It may be felt in the back, abdomen, side (flank), or groin. It can cause nausea. Renal colic can come and go. HOME CARE INSTRUCTIONS Watch your condition for any changes. The following actions may help to lessen any discomfort that you are feeling: 1. Take medicines only as directed by your health care provider. 2. Ask your health care provider if it is okay to take over-the-counter pain medicine. 3. Drink enough fluid to keep your urine clear or pale yellow. Drink 6-8 glasses of water each day. 4. Limit the amount of salt that you eat to less than 2 grams per day. 5. Reduce the amount of protein in your diet. Eat less meat, fish, nuts, and dairy. 6. Avoid foods such as spinach, rhubarb, nuts, or bran. These may make kidney stones more likely to form. SEEK MEDICAL CARE IF:  You have a fever or chills.  Your urine smells bad or looks cloudy.  You have pain or burning when you pass urine. SEEK IMMEDIATE MEDICAL CARE IF:  Your flank pain or groin pain suddenly worsens.  You become confused or disoriented or you lose consciousness.   This information is not intended to replace advice given to you by your health care provider. Make sure you discuss any questions you have with your health care provider.   Document Released: 01/15/2005 Document Revised: 04/28/2014 Document Reviewed: 02/15/2014 Elsevier Interactive Patient Education 2016 Elsevier Inc.  Vaginal Laceration A vaginal laceration is a tear in the vaginal wall. A vaginal tear falls into one of three categories:  7. Obstetrically related tears that occur at the time of childbirth. 8. Trauma-related tears (most often related to sexual intercourse). 9. Spontaneous tears. Vaginal tears can cause heavy bleeding (hemorrhaging) depending on severity of the tear. Tears can be intensely tender and interfere with normal activities  of living. They can make sexual intercourse painful and bring on significant burning with urination. If you have a vaginal laceration, the area around your vagina may be painful when you touch or wipe it. Even light pressure from clothing may cause some pain. Vaginal tear need to be evaluated by your caregiver.  CAUSES   Obstetric-related causes, such as childbirth.  Trauma that may result from an accident during an activity, such as sexual intercourse or a bicycle ride.  Spontaneous causes related to aging, failed healing of a past obstetric tear, chronic irritation, or skin changes that are not well understood. SYMPTOMS   Slight to heavy vaginal bleeding.  Vaginal swelling.  Mild to severe pain.  Vaginal tenderness. DIAGNOSIS  If the tear happened during childbirth, your caregiver can diagnose the tear at that time. To diagnose a vaginal tear that happened spontaneously or because of trauma, your caregiver will perform a physical exam. During the physical exam, your caregiver may also look for any signs of trouble that may need further testing. If there is hemorrhaging, your caregiver may suggest blood tests to determine the extent of bleeding. Imaging tests may be performed, such as an ultrasonography or computed tomography (CT), to look for internal damage. A biopsy may be need if there are signs of a more serious problem.  TREATMENT  Treatment depends on the severity of the tear. For minor tears that heal on their own, treatment may only consist of keeping the area clean and dry. Some tears need to be repaired with stitches. Other tears may  heal on their own with help from various remedies, such as antibiotic ointments, medicated creams, or petroleum products. Depending on the circumstances, oral hormones may also be suggested. Hormone remedies may also be in the form of topical creams and vaginal tablets. For more concerning situations, hospitalization and surgical repair of the tear may  be needed. HOME CARE INSTRUCTIONS   Take warm-water baths that cover your hips and buttocks (sitz bath) 2 to 3 times a day. This may help any discomfort and swelling.   Only take over-the-counter or prescription medicines for pain, discomfort, or fever as directed by your caregiver. Do not use aspirin because it can cause increased bleeding.   Do not douche, use tampons, or have intercourse until your caregiver says it is okay.   A bandage (dressing) may have been applied. Change the dressing once a day or as directed. If the dressing sticks, soak it off with warm, soapy water.   Apply ice or witch hazel pads to the vagina to lessen any pain or discomfort.   Take a stool softener or follow a special diet as directed by your caregiver. This will help ease discomfort associated with bowel movements.  SEEK IMMEDIATE MEDICAL CARE IF:   You have redness or swelling in the vaginal area.   You have increasing, sharp, or intense pain or tenderness in the vaginal area.  You have pus or unusual discharge coming from the tear or vagina.   You notice a bad smell coming from the vagina.   Your tear breaks open after it healed or was repaired.   You feel lightheaded.  You have increasing abdominal pain.   You have an increasing or heavy amount of vaginal bleeding.   You have pain with intercourse after the tear heals.  MAKE SURE YOU:  Understand these instructions.  Will watch your condition.  Will get help right away if you are not doing well or get worse.   This information is not intended to replace advice given to you by your health care provider. Make sure you discuss any questions you have with your health care provider.   Document Released: 04/07/2005 Document Revised: 12/31/2011 Document Reviewed: 08/25/2011 Elsevier Interactive Patient Education [employer]2016 Elsevier Inc.

## 2015-02-23 ENCOUNTER — Observation Stay
Admission: EM | Admit: 2015-02-23 | Discharge: 2015-02-27 | Disposition: A | Discharge status: Home / Self Care | Payer: Medicare Other | Attending: Internal Medicine | Admitting: Internal Medicine

## 2015-02-23 ENCOUNTER — Other Ambulatory Visit: Payer: Self-pay

## 2015-02-23 ENCOUNTER — Observation Stay (HOSPITAL_BASED_OUTPATIENT_CLINIC_OR_DEPARTMENT_OTHER): Payer: Medicare Other

## 2015-02-23 ENCOUNTER — Telehealth: Payer: Self-pay | Admitting: Nurse Practitioner

## 2015-02-23 ENCOUNTER — Emergency Department: Payer: Medicare Other

## 2015-02-23 DIAGNOSIS — K219 Gastro-esophageal reflux disease without esophagitis: Secondary | ICD-10-CM | POA: Insufficient documentation

## 2015-02-23 DIAGNOSIS — G4730 Sleep apnea, unspecified: Secondary | ICD-10-CM | POA: Diagnosis not present

## 2015-02-23 DIAGNOSIS — Z79899 Other long term (current) drug therapy: Secondary | ICD-10-CM | POA: Diagnosis not present

## 2015-02-23 DIAGNOSIS — I200 Unstable angina: Secondary | ICD-10-CM | POA: Diagnosis present

## 2015-02-23 DIAGNOSIS — I25110 Atherosclerotic heart disease of native coronary artery with unstable angina pectoris: Principal | ICD-10-CM | POA: Insufficient documentation

## 2015-02-23 DIAGNOSIS — I10 Essential (primary) hypertension: Secondary | ICD-10-CM | POA: Insufficient documentation

## 2015-02-23 DIAGNOSIS — G44209 Tension-type headache, unspecified, not intractable: Secondary | ICD-10-CM | POA: Insufficient documentation

## 2015-02-23 DIAGNOSIS — R32 Unspecified urinary incontinence: Secondary | ICD-10-CM | POA: Insufficient documentation

## 2015-02-23 DIAGNOSIS — E785 Hyperlipidemia, unspecified: Secondary | ICD-10-CM | POA: Insufficient documentation

## 2015-02-23 DIAGNOSIS — Z7984 Long term (current) use of oral hypoglycemic drugs: Secondary | ICD-10-CM | POA: Insufficient documentation

## 2015-02-23 DIAGNOSIS — G2581 Restless legs syndrome: Secondary | ICD-10-CM | POA: Diagnosis not present

## 2015-02-23 DIAGNOSIS — E1165 Type 2 diabetes mellitus with hyperglycemia: Secondary | ICD-10-CM | POA: Diagnosis not present

## 2015-02-23 DIAGNOSIS — Z23 Encounter for immunization: Secondary | ICD-10-CM | POA: Insufficient documentation

## 2015-02-23 DIAGNOSIS — Z955 Presence of coronary angioplasty implant and graft: Secondary | ICD-10-CM | POA: Diagnosis not present

## 2015-02-23 DIAGNOSIS — G894 Chronic pain syndrome: Secondary | ICD-10-CM | POA: Diagnosis not present

## 2015-02-23 DIAGNOSIS — Z794 Long term (current) use of insulin: Secondary | ICD-10-CM | POA: Insufficient documentation

## 2015-02-23 DIAGNOSIS — E7800 Pure hypercholesterolemia, unspecified: Secondary | ICD-10-CM | POA: Insufficient documentation

## 2015-02-23 DIAGNOSIS — Z7951 Long term (current) use of inhaled steroids: Secondary | ICD-10-CM | POA: Diagnosis not present

## 2015-02-23 DIAGNOSIS — Z87442 Personal history of urinary calculi: Secondary | ICD-10-CM | POA: Insufficient documentation

## 2015-02-23 DIAGNOSIS — Z888 Allergy status to other drugs, medicaments and biological substances status: Secondary | ICD-10-CM | POA: Diagnosis not present

## 2015-02-23 DIAGNOSIS — R002 Palpitations: Secondary | ICD-10-CM | POA: Diagnosis present

## 2015-02-23 DIAGNOSIS — G4700 Insomnia, unspecified: Secondary | ICD-10-CM | POA: Insufficient documentation

## 2015-02-23 DIAGNOSIS — R011 Cardiac murmur, unspecified: Secondary | ICD-10-CM | POA: Insufficient documentation

## 2015-02-23 DIAGNOSIS — Z7982 Long term (current) use of aspirin: Secondary | ICD-10-CM | POA: Insufficient documentation

## 2015-02-23 DIAGNOSIS — J449 Chronic obstructive pulmonary disease, unspecified: Secondary | ICD-10-CM | POA: Diagnosis not present

## 2015-02-23 DIAGNOSIS — Z8711 Personal history of peptic ulcer disease: Secondary | ICD-10-CM | POA: Diagnosis not present

## 2015-02-23 DIAGNOSIS — M1990 Unspecified osteoarthritis, unspecified site: Secondary | ICD-10-CM | POA: Insufficient documentation

## 2015-02-23 DIAGNOSIS — R079 Chest pain, unspecified: Secondary | ICD-10-CM

## 2015-02-23 DIAGNOSIS — I491 Atrial premature depolarization: Secondary | ICD-10-CM | POA: Insufficient documentation

## 2015-02-23 DIAGNOSIS — I252 Old myocardial infarction: Secondary | ICD-10-CM | POA: Insufficient documentation

## 2015-02-23 DIAGNOSIS — I259 Chronic ischemic heart disease, unspecified: Secondary | ICD-10-CM | POA: Insufficient documentation

## 2015-02-23 DIAGNOSIS — F17210 Nicotine dependence, cigarettes, uncomplicated: Secondary | ICD-10-CM | POA: Diagnosis not present

## 2015-02-23 DIAGNOSIS — F319 Bipolar disorder, unspecified: Secondary | ICD-10-CM | POA: Diagnosis not present

## 2015-02-23 DIAGNOSIS — F419 Anxiety disorder, unspecified: Secondary | ICD-10-CM | POA: Insufficient documentation

## 2015-02-23 DIAGNOSIS — J209 Acute bronchitis, unspecified: Secondary | ICD-10-CM | POA: Diagnosis not present

## 2015-02-23 DIAGNOSIS — Z8673 Personal history of transient ischemic attack (TIA), and cerebral infarction without residual deficits: Secondary | ICD-10-CM | POA: Insufficient documentation

## 2015-02-23 DIAGNOSIS — R569 Unspecified convulsions: Secondary | ICD-10-CM | POA: Insufficient documentation

## 2015-02-23 HISTORY — DX: Insomnia, unspecified: G47.00

## 2015-02-23 LAB — GLUCOSE, CAPILLARY
Glucose-Capillary: 182 mg/dL — ABNORMAL HIGH (ref 65–99)
Glucose-Capillary: 194 mg/dL — ABNORMAL HIGH (ref 65–99)
Glucose-Capillary: 175 mg/dL — ABNORMAL HIGH (ref 65–99)
Glucose-Capillary: 237 mg/dL — ABNORMAL HIGH (ref 65–99)

## 2015-02-23 LAB — CBC
HCT: 40.3 % (ref 35.0–47.0)
Hemoglobin: 13.2 g/dL (ref 12.0–16.0)
MCH: 31.4 pg (ref 26.0–34.0)
MCHC: 32.8 g/dL (ref 32.0–36.0)
MCV: 95.7 fL (ref 80.0–100.0)
Platelets: 243 10*3/uL (ref 150–440)
RBC: 4.21 MIL/uL (ref 3.80–5.20)
RDW: 14 % (ref 11.5–14.5)
WBC: 13.3 10*3/uL — ABNORMAL HIGH (ref 3.6–11.0)

## 2015-02-23 LAB — COMPREHENSIVE METABOLIC PANEL
ALT: 20 U/L (ref 14–54)
AST: 17 U/L (ref 15–41)
Albumin: 4.3 g/dL (ref 3.5–5.0)
Alkaline Phosphatase: 73 U/L (ref 38–126)
Anion gap: 5 (ref 5–15)
BUN: 18 mg/dL (ref 6–20)
CO2: 26 mmol/L (ref 22–32)
Calcium: 9.4 mg/dL (ref 8.9–10.3)
Chloride: 110 mmol/L (ref 101–111)
Creatinine, Ser: 0.56 mg/dL (ref 0.44–1.00)
GFR calc Af Amer: >60 mL/min (ref >60)
GFR calc non Af Amer: >60 mL/min (ref >60)
Glucose, Bld: 217 mg/dL — ABNORMAL HIGH (ref 65–99)
Potassium: 3.5 mmol/L (ref 3.5–5.1)
Sodium: 141 mmol/L (ref 135–145)
Total Bilirubin: 0.3 mg/dL (ref 0.3–1.2)
Total Protein: 6.9 g/dL (ref 6.5–8.1)

## 2015-02-23 LAB — NM MYOCAR MULTI W/SPECT W/WALL MOTION / EF
CHL CUP NUCLEAR SSS: 10
LV dias vol: 94 mL
LVSYSVOL: 35 mL
NUC STRESS TID: 1.49
SDS: 9
SRS: 1

## 2015-02-23 LAB — HEMOGLOBIN A1C: Hgb A1c MFr Bld: 9.6 % — ABNORMAL HIGH (ref 4.0–6.0)

## 2015-02-23 LAB — TROPONIN I: Troponin I: <0.03 ng/mL (ref <0.031)

## 2015-02-23 LAB — TSH: TSH: 0.266 u[IU]/mL — ABNORMAL LOW (ref 0.350–4.500)

## 2015-02-23 MED ORDER — ACETAMINOPHEN 650 MG RE SUPP: 650.0000 mg | Freq: Four times a day (QID) | RECTAL | Status: DC | PRN

## 2015-02-23 MED ORDER — METOPROLOL SUCCINATE ER 100 MG PO TB24
200.0000 mg | ORAL_TABLET | Freq: Every day | ORAL | Status: DC
  Administered 2015-02-23 – 2015-02-27 (×5): 200 mg via ORAL
  Filled 2015-02-23 (×5): qty 2

## 2015-02-23 MED ORDER — CARISOPRODOL 350 MG PO TABS
350.0000 mg | ORAL_TABLET | Freq: Three times a day (TID) | ORAL | Status: DC | PRN
  Administered 2015-02-23 – 2015-02-27 (×13): 350 mg via ORAL
  Filled 2015-02-23 (×13): qty 1

## 2015-02-23 MED ORDER — REGADENOSON 0.4 MG/5ML IV SOLN
0.4000 mg | Freq: Once | INTRAVENOUS | Status: AC
  Administered 2015-02-23: 0.4 mg via INTRAVENOUS

## 2015-02-23 MED ORDER — GABAPENTIN 300 MG PO CAPS
900.0000 mg | ORAL_CAPSULE | Freq: Three times a day (TID) | ORAL | Status: DC
  Administered 2015-02-24 – 2015-02-27 (×10): 900 mg via ORAL
  Filled 2015-02-23 (×10): qty 3

## 2015-02-23 MED ORDER — GABAPENTIN 300 MG PO CAPS
600.0000 mg | ORAL_CAPSULE | Freq: Three times a day (TID) | ORAL | Status: DC
Start: 2015-02-23 — End: 2015-02-23

## 2015-02-23 MED ORDER — MORPHINE SULFATE (PF) 2 MG/ML IV SOLN
INTRAVENOUS | Status: AC
  Administered 2015-02-23: 2 mg via INTRAVENOUS
  Filled 2015-02-23: qty 1

## 2015-02-23 MED ORDER — LISINOPRIL 20 MG PO TABS
40.0000 mg | ORAL_TABLET | Freq: Every day | ORAL | Status: DC
  Administered 2015-02-23 – 2015-02-27 (×5): 40 mg via ORAL
  Filled 2015-02-23 (×5): qty 2

## 2015-02-23 MED ORDER — HEPARIN SODIUM (PORCINE) 5000 UNIT/ML IJ SOLN
5000.0000 [IU] | Freq: Three times a day (TID) | INTRAMUSCULAR | Status: DC
  Administered 2015-02-23 – 2015-02-26 (×9): 5000 [IU] via SUBCUTANEOUS
  Filled 2015-02-23 (×9): qty 1

## 2015-02-23 MED ORDER — ATORVASTATIN CALCIUM 20 MG PO TABS
40.0000 mg | ORAL_TABLET | Freq: Every day | ORAL | Status: DC
  Administered 2015-02-23 – 2015-02-26 (×4): 40 mg via ORAL
  Filled 2015-02-23 (×4): qty 2

## 2015-02-23 MED ORDER — INSULIN ASPART 100 UNIT/ML ~~LOC~~ SOLN
0.0000 [IU] | Freq: Every day | SUBCUTANEOUS | Status: DC
  Administered 2015-02-23: 2 [IU] via SUBCUTANEOUS
  Administered 2015-02-26: 3 [IU] via SUBCUTANEOUS
  Filled 2015-02-23: qty 2
  Filled 2015-02-23: qty 3

## 2015-02-23 MED ORDER — OXYCODONE HCL 5 MG PO TABS
5.0000 mg | ORAL_TABLET | ORAL | Status: DC | PRN
  Administered 2015-02-24 – 2015-02-27 (×13): 5 mg via ORAL
  Filled 2015-02-23 (×13): qty 1

## 2015-02-23 MED ORDER — NITROGLYCERIN 2 % TD OINT
0.5000 [in_us] | TOPICAL_OINTMENT | Freq: Once | TRANSDERMAL | Status: AC
  Administered 2015-02-23: 0.5 [in_us] via TOPICAL
  Filled 2015-02-23: qty 1

## 2015-02-23 MED ORDER — GABAPENTIN 300 MG PO CAPS
300.0000 mg | ORAL_CAPSULE | Freq: Three times a day (TID) | ORAL | Status: DC | PRN
  Administered 2015-02-23 (×2): 300 mg via ORAL
  Filled 2015-02-23 (×2): qty 1

## 2015-02-23 MED ORDER — MUPIROCIN 2 % EX OINT
1.0000 "application " | TOPICAL_OINTMENT | Freq: Two times a day (BID) | CUTANEOUS | Status: DC
  Administered 2015-02-25: 1 via NASAL
  Filled 2015-02-23: qty 22

## 2015-02-23 MED ORDER — IPRATROPIUM-ALBUTEROL 0.5-2.5 (3) MG/3ML IN SOLN: 3.0000 mL | Freq: Every day | RESPIRATORY_TRACT | Status: DC | PRN

## 2015-02-23 MED ORDER — ASPIRIN 81 MG PO CHEW
324.0000 mg | CHEWABLE_TABLET | Freq: Once | ORAL | Status: AC
  Administered 2015-02-23: 324 mg via ORAL
  Filled 2015-02-23: qty 4

## 2015-02-23 MED ORDER — SODIUM CHLORIDE 0.9 % IV SOLN
INTRAVENOUS | Status: DC
  Administered 2015-02-23 – 2015-02-26 (×6): via INTRAVENOUS

## 2015-02-23 MED ORDER — INFLUENZA VAC SPLIT QUAD 0.5 ML IM SUSY
0.5000 mL | PREFILLED_SYRINGE | INTRAMUSCULAR | Status: AC
  Administered 2015-02-24: 0.5 mL via INTRAMUSCULAR
  Filled 2015-02-23: qty 0.5

## 2015-02-23 MED ORDER — ONDANSETRON HCL 4 MG PO TABS
4.0000 mg | ORAL_TABLET | Freq: Four times a day (QID) | ORAL | Status: DC | PRN
  Filled 2015-02-23: qty 1

## 2015-02-23 MED ORDER — INSULIN ASPART 100 UNIT/ML ~~LOC~~ SOLN
0.0000 [IU] | Freq: Three times a day (TID) | SUBCUTANEOUS | Status: DC
  Administered 2015-02-23 (×2): 2 [IU] via SUBCUTANEOUS
  Administered 2015-02-24: 3 [IU] via SUBCUTANEOUS
  Administered 2015-02-24: 2 [IU] via SUBCUTANEOUS
  Administered 2015-02-24 – 2015-02-25 (×2): 1 [IU] via SUBCUTANEOUS
  Administered 2015-02-25: 5 [IU] via SUBCUTANEOUS
  Administered 2015-02-25 – 2015-02-26 (×3): 2 [IU] via SUBCUTANEOUS
  Administered 2015-02-27: 3 [IU] via SUBCUTANEOUS
  Administered 2015-02-27: 2 [IU] via SUBCUTANEOUS
  Filled 2015-02-23: qty 2
  Filled 2015-02-23: qty 3
  Filled 2015-02-23 (×2): qty 2
  Filled 2015-02-23: qty 3
  Filled 2015-02-23: qty 5
  Filled 2015-02-23: qty 2
  Filled 2015-02-23: qty 1
  Filled 2015-02-23 (×2): qty 2
  Filled 2015-02-23: qty 1
  Filled 2015-02-23: qty 2

## 2015-02-23 MED ORDER — ADULT MULTIVITAMIN W/MINERALS CH
1.0000 | ORAL_TABLET | Freq: Every day | ORAL | Status: DC
  Administered 2015-02-23 – 2015-02-27 (×5): 1 via ORAL
  Filled 2015-02-23 (×5): qty 1

## 2015-02-23 MED ORDER — MORPHINE SULFATE (PF) 2 MG/ML IV SOLN
2.0000 mg | INTRAVENOUS | Status: DC | PRN
  Administered 2015-02-23 (×3): 2 mg via INTRAVENOUS
  Filled 2015-02-23 (×2): qty 1

## 2015-02-23 MED ORDER — ASPIRIN EC 325 MG PO TBEC
325.0000 mg | DELAYED_RELEASE_TABLET | Freq: Every day | ORAL | Status: DC
  Administered 2015-02-24: 325 mg via ORAL
  Filled 2015-02-23: qty 1

## 2015-02-23 MED ORDER — CLOPIDOGREL BISULFATE 75 MG PO TABS
75.0000 mg | ORAL_TABLET | Freq: Every day | ORAL | Status: DC
  Administered 2015-02-23 – 2015-02-27 (×5): 75 mg via ORAL
  Filled 2015-02-23 (×5): qty 1

## 2015-02-23 MED ORDER — IPRATROPIUM-ALBUTEROL 20-100 MCG/ACT IN AERS: 1.0000 | INHALATION_SPRAY | Freq: Every day | RESPIRATORY_TRACT | Status: DC | PRN

## 2015-02-23 MED ORDER — NICOTINE 21 MG/24HR TD PT24
21.0000 mg | MEDICATED_PATCH | Freq: Every day | TRANSDERMAL | Status: DC
  Administered 2015-02-23 – 2015-02-26 (×4): 21 mg via TRANSDERMAL
  Filled 2015-02-23 (×5): qty 1

## 2015-02-23 MED ORDER — ONDANSETRON HCL 4 MG/2ML IJ SOLN
4.0000 mg | Freq: Four times a day (QID) | INTRAMUSCULAR | Status: DC | PRN
  Administered 2015-02-23 – 2015-02-25 (×4): 4 mg via INTRAVENOUS
  Filled 2015-02-23 (×4): qty 2

## 2015-02-23 MED ORDER — HYDROMORPHONE HCL 2 MG PO TABS
2.0000 mg | ORAL_TABLET | Freq: Two times a day (BID) | ORAL | Status: DC | PRN
  Administered 2015-02-23 – 2015-02-27 (×7): 2 mg via ORAL
  Filled 2015-02-23 (×7): qty 1

## 2015-02-23 MED ORDER — ACETAMINOPHEN 325 MG PO TABS
650.0000 mg | ORAL_TABLET | Freq: Four times a day (QID) | ORAL | Status: DC | PRN
  Administered 2015-02-25 – 2015-02-26 (×3): 650 mg via ORAL
  Filled 2015-02-23 (×3): qty 2

## 2015-02-23 MED ORDER — TECHNETIUM TC 99M SESTAMIBI - CARDIOLITE
13.0000 | Freq: Once | INTRAVENOUS | Status: AC | PRN
  Administered 2015-02-23: 12.55 via INTRAVENOUS

## 2015-02-23 MED ORDER — HYDROMORPHONE HCL 2 MG PO TABS: 2.0000 mg | ORAL_TABLET | Freq: Four times a day (QID) | ORAL | Status: DC | PRN

## 2015-02-23 MED ORDER — TECHNETIUM TC 99M SESTAMIBI GENERIC - CARDIOLITE
30.0000 | Freq: Once | INTRAVENOUS | Status: AC | PRN
  Administered 2015-02-23: 32.06 via INTRAVENOUS

## 2015-02-23 MED ORDER — DOCUSATE SODIUM 100 MG PO CAPS
100.0000 mg | ORAL_CAPSULE | Freq: Two times a day (BID) | ORAL | Status: DC
  Administered 2015-02-23 – 2015-02-27 (×9): 100 mg via ORAL
  Filled 2015-02-23 (×9): qty 1

## 2015-02-23 MED ORDER — ALBUTEROL SULFATE (2.5 MG/3ML) 0.083% IN NEBU: 3.0000 mL | INHALATION_SOLUTION | Freq: Four times a day (QID) | RESPIRATORY_TRACT | Status: DC | PRN

## 2015-02-23 MED ORDER — SODIUM CHLORIDE 0.9 % IJ SOLN
3.0000 mL | Freq: Two times a day (BID) | INTRAMUSCULAR | Status: DC
  Administered 2015-02-23 – 2015-02-26 (×3): 3 mL via INTRAVENOUS

## 2015-02-23 NOTE — ED Notes (Signed)
Pt reports that chest pain is improved since pt is resting.  Pt still having throbbing headache.  Medicated per MAR.  Pt also having nausea.  Medicated per MAR.  Pt resting quietly at this time.

## 2015-02-23 NOTE — Consult Note (Signed)
CARDIOLOGY CONSULT NOTE  Patient ID: Jennifer [name] MRN: [id number] DOB/AGE: October 11, 1960 54 y.o.  Admit date: 02/23/2015  Primary Cardiologist : Dr. Rockey Situ Reason for Consultation : chest pain.    HPI:  Jennifer [name] is a 54 year old woman with long history of smoking, coronary artery disease, previous stent to the mid RCA X2 and 1 stent to the distal RCA in 05/2014.She has multiple chronic medical conditions that include diabetes, hypertension, hyperlipidemia and tobacco use. She presented with chest pain that started last night associated with the palpitations. It was described as tightness feeling radiating to her neck. She had severe headache at the same time which actually was worse than her chest pain. She reports chronic migraines. Some of the symptoms were similar to her previous presentation with unstable angina but not exactly the same. Her first troponin was negative and EKG showed no acute changes. She continues to have mild chest discomfort and a headache. During her last visit in September, Imdur was discontinued due to relatively low blood pressure.  Review of systems complete and found to be negative unless listed above   Past Medical History  Diagnosis Date  . Hypertension   . High cholesterol   . Heart murmur   . Myocardial infarction ([place]) 2008; ~ 2012  . Chronic bronchitis ([place])     "get it q yr"  . Pneumonia     "get it 1-2 times/year" (06/14/2014)  . Sleep apnea     "they want me to do the lab but I haven't" (06/14/2014)  . RLS (restless legs syndrome)   . Type II diabetes mellitus ([place])   . GERD (gastroesophageal reflux disease)   . History of stomach ulcers   . Daily headache     "when my blood pressure is up" (06/14/2014)  . Migraine     "haven't had them in a good while; did get them 1-2 times/yr" (06/14/2014)  . Seizures ([place])   . Stroke [facility])     "they say I've had several" (06/14/2014)  . Arthritis     "qwhere" (06/14/2014)  . Anxiety   . Depression    . Bipolar disorder ([place])   . Kidney stones   . Urinary, incontinence, stress female     Family History  Problem Relation Age of Onset  . Family history unknown: Yes    Social History   Social History  . Marital Status: Married    Spouse Name: N/A  . Number of Children: N/A  . Years of Education: N/A   Occupational History  . Not on file.   Social History Main Topics  . Smoking status: Current Every Day Smoker -- 0.25 packs/day for 35 years    Types: Cigarettes  . Smokeless tobacco: Never Used  . Alcohol Use: Yes     Comment: 06/14/2014 "might drink twice/yr"  . Drug Use: Yes    Special: Marijuana     Comment: 06/14/2014 "maybe once/wk"  . Sexual Activity: Not Currently   Other Topics Concern  . Not on file   Social History Narrative    Past Surgical History  Procedure Laterality Date  . Tonsillectomy    . Knee arthroscopy Left   . Extracorporeal shock wave lithotripsy  X 2  . Dilation and curettage of uterus    . Cesarean section  1984  . Tubal ligation    . Abdominal hysterectomy  1984    "I've got 1 ovary left"  . Coronary angioplasty with stent placement  2008; ~  2012    "1; 1"  . Left heart catheterization with coronary angiogram N/A 06/15/2014    Procedure: LEFT HEART CATHETERIZATION WITH CORONARY ANGIOGRAM;  Surgeon: Jettie Booze, MD;  Location: [facility] CATH LAB;  Service: Cardiovascular;  Laterality: N/A;  . Percutaneous coronary stent intervention (pci-s)  06/15/2014    Procedure: PERCUTANEOUS CORONARY STENT INTERVENTION (PCI-S);  Surgeon: Jettie Booze, MD;  Location: [facility] CATH LAB;  Service: Cardiovascular;;     Prescriptions prior to admission  Medication Sig Dispense Refill Last Dose  . atorvastatin (LIPITOR) 40 MG tablet Take 1 tablet (40 mg total) by mouth daily at 6 PM. 90 tablet 3 02/22/2015 at Unknown time  . carisoprodol (SOMA) 350 MG tablet Take 350 mg by mouth 3 (three) times daily as needed for muscle spasms.   02/22/2015 at Unknown time    . clopidogrel (PLAVIX) 75 MG tablet Take 1 tablet (75 mg total) by mouth daily with breakfast. 30 tablet 11 02/22/2015 at Unknown time  . COMBIVENT RESPIMAT 20-100 MCG/ACT AERS respimat Inhale 1 puff into the lungs daily as needed for wheezing or shortness of breath.    PRN at PRN  . gabapentin (NEURONTIN) 300 MG capsule Take 300 mg by mouth 3 (three) times daily as needed.   02/22/2015 at Unknown time  . HYDROmorphone (DILAUDID) 2 MG tablet Take 1 tablet (2 mg total) by mouth every 12 (twelve) hours as needed for severe pain. 12 tablet 0 02/22/2015 at Unknown time  . lisinopril (PRINIVIL,ZESTRIL) 40 MG tablet Take 1 tablet (40 mg total) by mouth daily. 30 tablet 11 02/22/2015 at Unknown time  . metoprolol (TOPROL-XL) 200 MG 24 hr tablet Take 1 tablet (200 mg total) by mouth daily. 30 tablet 11 02/22/2015 at 0700  . Multiple Vitamins-Minerals (MULTIVITAMIN WITH MINERALS) tablet Take 1 tablet by mouth daily.   02/22/2015 at Unknown time  . mupirocin ointment (BACTROBAN) 2 % Place 1 application into the nose 2 (two) times daily. 22 g 0 Past Week at Unknown time  . oxycodone (OXY-IR) 5 MG capsule Take 5 mg by mouth every 4 (four) hours as needed for pain.   02/22/2015 at Unknown time  . VENTOLIN HFA 108 (90 BASE) MCG/ACT inhaler Inhale 1-2 puffs into the lungs every 6 (six) hours as needed for wheezing or shortness of breath.    PRN at PRN  . blood glucose meter kit and supplies KIT Dispense based on patient and insurance preference. Use up to three times daily blood sugar checks as directed. (FOR ICD-9 250.00, 250.01). 1 each 0 Taking  . isosorbide mononitrate (IMDUR) 30 MG 24 hr tablet Take 1 tablet (30 mg total) by mouth daily. (Patient not taking: Reported on 02/23/2015) 30 tablet 11 Taking    Physical Exam: Blood pressure 173/72, pulse 63, temperature 97.9 F (36.6 C), temperature source Oral, resp. rate 16, height _0  (1.575 m), weight 189 lb 1.6 oz (85.775 kg), SpO2 100 %.  Constitutional: She is  oriented to person, place, and time. She appears well-developed and well-nourished. No distress.  HENT: No nasal discharge.  Head: Normocephalic and atraumatic.  Eyes: Pupils are equal and round. No discharge.  Neck: Normal range of motion. Neck supple. No JVD present. No thyromegaly present.  Cardiovascular: Normal rate, regular rhythm, normal heart sounds. Exam reveals no gallop and no friction rub. No murmur heard.  Pulmonary/Chest: Effort normal and breath sounds normal. No stridor. No respiratory distress. She has no wheezes. She has no rales. She exhibits no  tenderness.  Abdominal: Soft. Bowel sounds are normal. She exhibits no distension. There is no tenderness. There is no rebound and no guarding.  Musculoskeletal: Normal range of motion. She exhibits no edema and no tenderness.  Neurological: She is alert and oriented to person, place, and time. Coordination normal.  Skin: Skin is warm and dry. No rash noted. She is not diaphoretic. No erythema. No pallor.  Psychiatric: She has a normal mood and affect. Her behavior is normal. Judgment and thought content normal.    Labs:   Lab Results  Component Value Date   WBC 13.3* 02/23/2015   HGB 13.2 02/23/2015   HCT 40.3 02/23/2015   MCV 95.7 02/23/2015   PLT 243 02/23/2015    Recent Labs Lab 02/23/15 0140  NA 141  K 3.5  CL 110  CO2 26  BUN 18  CREATININE 0.56  CALCIUM 9.4  PROT 6.9  BILITOT 0.3  ALKPHOS 73  ALT 20  AST 17  GLUCOSE 217*   Lab Results  Component Value Date   TROPONINI <0.03 02/23/2015       EKG: Normal sinus rhythm with no significant ST or T wave changes.  ASSESSMENT AND PLAN:    1. Possible unstable angina: The patient has known history of coronary artery disease with previous stenting of the right coronary artery 3. Symptoms have some anginal features and some atypical features. The associated headache seems to be suggestive of a migraine. There is also possibility of coronary spasm as Imdur was  discontinued in September. She had only one troponin checked for some reasons and I ordered a second one stat. Given her prolonged chest pain at rest, I would expect some elevated troponin if this is cardiac in nature. If troponin is negative, the plan is to proceed with a nuclear stress test. Otherwise, the patient might require repeat cardiac catheterization. Continue outpatient cardiac medications for now.  2. Hyperlipidemia: Continue treatment with atorvastatin.    Signed: Kathlyn Sacramento MD, [facility] 02/23/2015, 9:18 AM

## 2015-02-23 NOTE — ED Notes (Signed)
Patient to ED for chest pain that woke her up about an hour ago. States she was "just laying there and her heart just started going really fast and she couldn't stop it." States she couldn't catch her breath and her jaw started hurting.

## 2015-02-23 NOTE — Telephone Encounter (Signed)
Noted. Thank you. Will continue to follow. 

## 2015-02-23 NOTE — ED Notes (Signed)
Pt resting, awaiting admitting MD.  Nitro paste placed on pt's L thigh.

## 2015-02-23 NOTE — Progress Notes (Signed)
Per glucometer CBG 194.

## 2015-02-23 NOTE — Progress Notes (Signed)
Per Dr. Sherryll BurgerShah okay for patient to have heart health/carb modified diet. MD aware of positive stress test.

## 2015-02-23 NOTE — Care Management Obs Status (Signed)
MEDICARE OBSERVATION STATUS NOTIFICATION   Patient Details  Name: Jennifer [name] MRN: [id number] Date of Birth: 17-Jan-1961   Medicare Observation Status Notification Given:  Yes  OBS letter to patient in ER [address]    Berna BueCheryl Kelsi Benham, RN 02/23/2015, 6:58 AM

## 2015-02-23 NOTE — Progress Notes (Signed)
Stress test showed evidence of inferior wall ischemia with normal EF. Plan cardiac cath on Monday (scheduled it at 12:30). I discussed with patient.

## 2015-02-23 NOTE — H&P (Signed)
Jennifer [name] is an 53 y.o. female.   Chief Complaint: Chest pain HPI: The patient presents emergency department complaining of chest pain that awoke her from sleep. She states that the pain was 8 or 9 out of 10 in severity and was located in the center of her chest. It was dull and pressure-like in quality. The patient states that the pain radiated to her arms and eventually became numbness in her hands that she states "felt as if her hands might explode". The patient states that she got up to get a drink of water which somehow ease the pain but the pain returned after she went to lay down again. After coming to the emergency department receiving nitroglycerin paste, morphine as well as 325 mg of chewable aspirin patient's pain decreased to possibly 5 out of 10 in severity. Her chest intermittently hurts at rest and with exertion. She admits to some nausea but has not vomited. She also admits to diaphoresis and mild shortness of breath. The emergency department EKG shows no changes associated with ischemia however the patient has continued chest pain which prompted emergency department staff to call for admission.  Past Medical History  Diagnosis Date  . Hypertension   . High cholesterol   . Heart murmur   . Myocardial infarction ([place]) 2008; ~ 2012  . Chronic bronchitis ([place])     "get it q yr"  . Pneumonia     "get it 1-2 times/year" (06/14/2014)  . Sleep apnea     "they want me to do the lab but I haven't" (06/14/2014)  . RLS (restless legs syndrome)   . Type II diabetes mellitus ([place])   . GERD (gastroesophageal reflux disease)   . History of stomach ulcers   . Daily headache     "when my blood pressure is up" (06/14/2014)  . Migraine     "haven't had them in a good while; did get them 1-2 times/yr" (06/14/2014)  . Seizures ([place])   . Stroke [facility])     "they say I've had several" (06/14/2014)  . Arthritis     "qwhere" (06/14/2014)  . Anxiety   . Depression   . Bipolar disorder ([place])   .  Kidney stones   . Urinary, incontinence, stress female     Past Surgical History  Procedure Laterality Date  . Tonsillectomy    . Knee arthroscopy Left   . Extracorporeal shock wave lithotripsy  X 2  . Dilation and curettage of uterus    . Cesarean section  1984  . Tubal ligation    . Abdominal hysterectomy  1984    "I've got 1 ovary left"  . Coronary angioplasty with stent placement  2008; ~ 2012    "1; 1"  . Left heart catheterization with coronary angiogram N/A 06/15/2014    Procedure: LEFT HEART CATHETERIZATION WITH CORONARY ANGIOGRAM;  Surgeon: Jettie Booze, MD;  Location: [facility] CATH LAB;  Service: Cardiovascular;  Laterality: N/A;  . Percutaneous coronary stent intervention (pci-s)  06/15/2014    Procedure: PERCUTANEOUS CORONARY STENT INTERVENTION (PCI-S);  Surgeon: Jettie Booze, MD;  Location: [facility] CATH LAB;  Service: Cardiovascular;;    Family History  Problem Relation Age of Onset  . Family history unknown: Yes   Social History:  reports that she has been smoking Cigarettes.  She has a 8.75 pack-year smoking history. She has never used smokeless tobacco. She reports that she drinks alcohol. She reports that she uses illicit drugs (Marijuana).  Allergies:  Allergies  Allergen Reactions  . Lithium Other (See Comments)    Blood dyscrasia  . Tegretol [Carbamazepine] Other (See Comments)    Blood dyscrasia when on with Lithium  . Levaquin [Levofloxacin In D5w] Other (See Comments)    "Salty" sensation in mouth. "Hard time to explain it"  . Lodine [Etodolac] Other (See Comments)    Rash, N/V    Prior to Admission medications   Medication Sig Start Date End Date Taking? Authorizing Provider  atorvastatin (LIPITOR) 40 MG tablet Take 1 tablet (40 mg total) by mouth daily at 6 PM. 10/31/14  Yes Minna Merritts, MD  carisoprodol (SOMA) 350 MG tablet Take 350 mg by mouth 3 (three) times daily as needed for muscle spasms.   Yes Historical Provider, MD  clopidogrel  (PLAVIX) 75 MG tablet Take 1 tablet (75 mg total) by mouth daily with breakfast. 08/02/14  Yes Minna Merritts, MD  COMBIVENT RESPIMAT 20-100 MCG/ACT AERS respimat Inhale 1 puff into the lungs daily as needed for wheezing or shortness of breath.    Yes Historical Provider, MD  gabapentin (NEURONTIN) 300 MG capsule Take 300 mg by mouth 3 (three) times daily as needed.   Yes Historical Provider, MD  HYDROmorphone (DILAUDID) 2 MG tablet Take 1 tablet (2 mg total) by mouth every 12 (twelve) hours as needed for severe pain. 02/19/15 02/19/16 Yes Earleen Newport, MD  lisinopril (PRINIVIL,ZESTRIL) 40 MG tablet Take 1 tablet (40 mg total) by mouth daily. 08/02/14  Yes Minna Merritts, MD  metoprolol (TOPROL-XL) 200 MG 24 hr tablet Take 1 tablet (200 mg total) by mouth daily. 08/02/14  Yes Minna Merritts, MD  Multiple Vitamins-Minerals (MULTIVITAMIN WITH MINERALS) tablet Take 1 tablet by mouth daily.   Yes Historical Provider, MD  mupirocin ointment (BACTROBAN) 2 % Place 1 application into the nose 2 (two) times daily. 02/19/15  Yes Earleen Newport, MD  oxycodone (OXY-IR) 5 MG capsule Take 5 mg by mouth every 4 (four) hours as needed for pain.   Yes Historical Provider, MD  VENTOLIN HFA 108 (90 BASE) MCG/ACT inhaler Inhale 1-2 puffs into the lungs every 6 (six) hours as needed for wheezing or shortness of breath.    Yes Historical Provider, MD  blood glucose meter kit and supplies KIT Dispense based on patient and insurance preference. Use up to three times daily blood sugar checks as directed. (FOR ICD-9 250.00, 250.01). 06/16/14   Barton Dubois, MD  isosorbide mononitrate (IMDUR) 30 MG 24 hr tablet Take 1 tablet (30 mg total) by mouth daily. Patient not taking: Reported on 02/23/2015 08/02/14   Minna Merritts, MD     Results for orders placed or performed during the hospital encounter of 02/23/15 (from the past 48 hour(s))  CBC     Status: Abnormal   Collection Time: 02/23/15  1:40 AM  Result  Value Ref Range   WBC 13.3 (H) 3.6 - 11.0 K/uL   RBC 4.21 3.80 - 5.20 MIL/uL   Hemoglobin 13.2 12.0 - 16.0 g/dL   HCT 40.3 35.0 - 47.0 %   MCV 95.7 80.0 - 100.0 fL   MCH 31.4 26.0 - 34.0 pg   MCHC 32.8 32.0 - 36.0 g/dL   RDW 14.0 11.5 - 14.5 %   Platelets 243 150 - 440 K/uL  Comprehensive metabolic panel     Status: Abnormal   Collection Time: 02/23/15  1:40 AM  Result Value Ref Range   Sodium 141 135 - 145 mmol/L  Potassium 3.5 3.5 - 5.1 mmol/L   Chloride 110 101 - 111 mmol/L   CO2 26 22 - 32 mmol/L   Glucose, Bld 217 (H) 65 - 99 mg/dL   BUN 18 6 - 20 mg/dL   Creatinine, Ser 0.56 0.44 - 1.00 mg/dL   Calcium 9.4 8.9 - 10.3 mg/dL   Total Protein 6.9 6.5 - 8.1 g/dL   Albumin 4.3 3.5 - 5.0 g/dL   AST 17 15 - 41 U/L   ALT 20 14 - 54 U/L   Alkaline Phosphatase 73 38 - 126 U/L   Total Bilirubin 0.3 0.3 - 1.2 mg/dL   GFR calc non Af Amer >60 >60 mL/min   GFR calc Af Amer >60 >60 mL/min    Comment: (NOTE) The eGFR has been calculated using the CKD EPI equation. This calculation has not been validated in all clinical situations. eGFR's persistently <60 mL/min signify possible Chronic Kidney Disease.    Anion gap 5 5 - 15  Troponin I     Status: None   Collection Time: 02/23/15  1:40 AM  Result Value Ref Range   Troponin I <0.03 <0.031 ng/mL    Comment:        NO INDICATION OF MYOCARDIAL INJURY.    Dg Chest 2 View  02/23/2015  CLINICAL DATA:  Chest pain under the left breast which woke her up about an hour ago. Difficulty catching breath and jaw started hurting. EXAM: CHEST  2 VIEW COMPARISON:  06/13/2014 FINDINGS: Normal heart size and pulmonary vascularity. No focal airspace disease or consolidation in the lungs. No blunting of costophrenic angles. No pneumothorax. Mediastinal contours appear intact. Degenerative changes in the spine. IMPRESSION: No active cardiopulmonary disease. Electronically Signed   By: Lucienne Capers M.D.   On: 02/23/2015 01:58    Review of Systems   Constitutional: Negative for fever and chills.  HENT: Negative for sore throat and tinnitus.   Eyes: Negative for blurred vision and redness.  Respiratory: Negative for cough and shortness of breath.   Cardiovascular: Positive for chest pain. Negative for palpitations, orthopnea and PND.  Gastrointestinal: Positive for nausea. Negative for vomiting, abdominal pain and diarrhea.  Genitourinary: Negative for dysuria, urgency and frequency.  Musculoskeletal: Negative for myalgias and joint pain.  Skin: Negative for rash.       No lesions  Neurological: Negative for speech change, focal weakness and weakness. Headaches: Status post nitroglycerin.  Endo/Heme/Allergies: Does not bruise/bleed easily.       No temperature intolerance  Psychiatric/Behavioral: Negative for depression and suicidal ideas.    Blood pressure 129/82, pulse 61, temperature 97.9 F (36.6 C), temperature source Oral, resp. rate 14, height _0  (1.575 m), weight 81.647 kg (180 lb), SpO2 100 %. Physical Exam  Vitals reviewed. Constitutional: She is oriented to person, place, and time. She appears well-developed and well-nourished. No distress.  HENT:  Head: Normocephalic and atraumatic.  Mouth/Throat: Oropharynx is clear and moist.  Eyes: Conjunctivae and EOM are normal. Pupils are equal, round, and reactive to light. No scleral icterus.  Neck: Normal range of motion. Neck supple. No JVD present. No tracheal deviation present. No thyromegaly present.  Cardiovascular: Normal rate, regular rhythm and normal heart sounds.  Exam reveals no gallop and no friction rub.   No murmur heard. Respiratory: Effort normal and breath sounds normal.  GI: Soft. Bowel sounds are normal. She exhibits no distension. There is no tenderness.  Genitourinary:  Deferred  Musculoskeletal: Normal range of motion. She exhibits  no edema.  Lymphadenopathy:    She has no cervical adenopathy.  Neurological: She is alert and oriented to person,  place, and time. No cranial nerve deficit. She exhibits normal muscle tone.  Skin: Skin is warm and dry. No rash noted. No erythema.  Psychiatric: She has a normal mood and affect. Her behavior is normal. Judgment and thought content normal.     Assessment/Plan This is a 54 year old Caucasian female admitted for unstable angina. 1. Chest pain: Unstable angina. Past medical history significant for coronary artery disease status post stenting. Initial troponin is negative. We will continue cycle cardiac enzymes. Nitroglycerin paste remains in place due to ongoing chest pain. Blood pressure and heart rate are optimized to decrease myocardial oxygen demand. Cardiology consult placed. 2. CAD: Continue aspirin, Plavix and Imdur 3. Hypertension: Continue lisinopril and metoprolol 4. COPD: Continue inhalers per home regimen 5. Metabolic syndrome: Check hemoglobin A1c. Patient has been checking blood sugars at home but does not have any diabetic medication listed. Scale insulin if needed.  6. Tobacco abuse: NicoDerm patch 7. GI prophylaxis: None 8. DVT prolactins: Heparin The patient is a full code. Time spent on admission was inpatient care approximately 35 minutes  Harrie Foreman 02/23/2015, 7:51 AM

## 2015-02-23 NOTE — Care Management (Signed)
Patient admitted under observation with chest pain.  One troponins is negative.   Cardiology consult has been performed.  Additional troponins are pending.  Patient presents from home and independent in all adls.  Denies issues accessing medical care, obtaining medications, maintaining housing, utilities and food.   No discharge needs identified at present time.

## 2015-02-23 NOTE — ED Provider Notes (Signed)
[facility] Emergency Department Provider Note  ____________________________________________  Time seen: Approximately 5:29 AM  I have reviewed the triage vital signs and the nursing notes.   HISTORY  Chief Complaint Chest Pain    HPI Jennifer [name] is a 54 y.o. female who presents to the ED from home with a chief complaint of chest pain. Patient was trying to go to sleep approximately 1 AM when she experienced left-sided chest pressure radiating into her jaw all. Symptoms associated with sweating, palpitations, nausea and radiation into her left arm. Patient has had similar symptoms previously with her MI. Symptoms improved during her drive to the ED but worsened after she ambulated from the parking lot into the triage area. Denies recent travel or trauma. Denies associated fever, chills, abdominal pain, vomiting, diarrhea. Nothing makes her symptoms better. Exertion makes her symptoms worse.   Past Medical History  Diagnosis Date  . Hypertension   . High cholesterol   . Heart murmur   . Myocardial infarction ([place]) 2008; ~ 2012  . Chronic bronchitis ([place])     "get it q yr"  . Pneumonia     "get it 1-2 times/year" (06/14/2014)  . Sleep apnea     "they want me to do the lab but I haven't" (06/14/2014)  . RLS (restless legs syndrome)   . Type II diabetes mellitus ([place])   . GERD (gastroesophageal reflux disease)   . History of stomach ulcers   . Daily headache     "when my blood pressure is up" (06/14/2014)  . Migraine     "haven't had them in a good while; did get them 1-2 times/yr" (06/14/2014)  . Seizures ([place])   . Stroke [facility])     "they say I've had several" (06/14/2014)  . Arthritis     "qwhere" (06/14/2014)  . Anxiety   . Depression   . Bipolar disorder ([place])   . Kidney stones   . Urinary, incontinence, stress female     Patient Active Problem List   Diagnosis Date Noted  . Poorly controlled type 2 diabetes mellitus with complication ([place])  29/52/8413  . Hyperlipidemia 06/22/2014  . Stented coronary artery   . Chronic pain syndrome   . Unstable angina ([place])   . Chest pain at rest 06/14/2014  . Diabetes ([place]) 06/14/2014  . Acute bronchitis 06/14/2014  . Tobacco abuse 06/14/2014  . Essential hypertension 06/14/2014  . GERD (gastroesophageal reflux disease) 06/14/2014  . Bipolar disorder ([place]) 06/14/2014  . Chest pain 06/14/2014    Past Surgical History  Procedure Laterality Date  . Tonsillectomy    . Knee arthroscopy Left   . Extracorporeal shock wave lithotripsy  X 2  . Dilation and curettage of uterus    . Cesarean section  1984  . Tubal ligation    . Abdominal hysterectomy  1984    "I've got 1 ovary left"  . Coronary angioplasty with stent placement  2008; ~ 2012    "1; 1"  . Left heart catheterization with coronary angiogram N/A 06/15/2014    Procedure: LEFT HEART CATHETERIZATION WITH CORONARY ANGIOGRAM;  Surgeon: Jettie Booze, MD;  Location: [facility] CATH LAB;  Service: Cardiovascular;  Laterality: N/A;  . Percutaneous coronary stent intervention (pci-s)  06/15/2014    Procedure: PERCUTANEOUS CORONARY STENT INTERVENTION (PCI-S);  Surgeon: Jettie Booze, MD;  Location: [facility] CATH LAB;  Service: Cardiovascular;;    Current Outpatient Rx  Name  Route  Sig  Dispense  Refill  .  atorvastatin (LIPITOR) 40 MG tablet   Oral   Take 1 tablet (40 mg total) by mouth daily at 6 PM.   90 tablet   3   . blood glucose meter kit and supplies KIT      Dispense based on patient and insurance preference. Use up to three times daily blood sugar checks as directed. (FOR ICD-9 250.00, 250.01).   1 each   0   . carisoprodol (SOMA) 350 MG tablet   Oral   Take 350 mg by mouth 3 (three) times daily as needed for muscle spasms.         . clopidogrel (PLAVIX) 75 MG tablet   Oral   Take 1 tablet (75 mg total) by mouth daily with breakfast.   30 tablet   11   . doxycycline (VIBRAMYCIN) 100 MG capsule   Oral   Take 100  mg by mouth 2 (two) times daily.          [name] gabapentin (NEURONTIN) 300 MG capsule   Oral   Take 300 mg by mouth 3 (three) times daily as needed.         [name] HYDROmorphone (DILAUDID) 2 MG tablet   Oral   Take 1 tablet (2 mg total) by mouth every 12 (twelve) hours as needed for severe pain.   12 tablet   0   . isosorbide mononitrate (IMDUR) 30 MG 24 hr tablet   Oral   Take 1 tablet (30 mg total) by mouth daily.   30 tablet   11   . lisinopril (PRINIVIL,ZESTRIL) 40 MG tablet   Oral   Take 1 tablet (40 mg total) by mouth daily.   30 tablet   11   . metoprolol (TOPROL-XL) 200 MG 24 hr tablet   Oral   Take 1 tablet (200 mg total) by mouth daily.   30 tablet   11   . mupirocin ointment (BACTROBAN) 2 %   Nasal   Place 1 application into the nose 2 (two) times daily.   22 g   0   . oxycodone (OXY-IR) 5 MG capsule   Oral   Take 5 mg by mouth every 4 (four) hours as needed for pain.         [name] POTASSIUM PO   Oral   Take by mouth as needed.            Allergies Lithium; Tegretol; and Lodine  Family History  Problem Relation Age of Onset  . Family history unknown: Yes    Social History Social History  Substance Use Topics  . Smoking status: Current Every Day Smoker -- 0.25 packs/day for 35 years    Types: Cigarettes  . Smokeless tobacco: Never Used  . Alcohol Use: Yes     Comment: 06/14/2014 "might drink twice/yr"    Review of Systems Constitutional: No fever/chills Eyes: No visual changes. ENT: No sore throat. Cardiovascular: Positive for chest pain. Respiratory: Positive for shortness of breath. Gastrointestinal: No abdominal pain.  No nausea, no vomiting.  No diarrhea.  No constipation. Genitourinary: Negative for dysuria. Musculoskeletal: Negative for back pain. Skin: Negative for rash. Neurological: Negative for headaches, focal weakness or numbness.  10-point ROS otherwise negative.  ____________________________________________   PHYSICAL  EXAM:  VITAL SIGNS: ED Triage Vitals  Enc Vitals Group     BP 02/23/15 0140 163/99 mmHg     Pulse Rate 02/23/15 0140 91     Resp 02/23/15 0140 20  Temp 02/23/15 0140 98.3 F (36.8 C)     Temp Source 02/23/15 0140 Oral     SpO2 02/23/15 0140 97 %     Weight 02/23/15 0140 180 lb (81.647 kg)     Height 02/23/15 0140 $RemoveBefor'5\' 2"'[id number]$  (1.575 m)     Head Cir --      Peak Flow --      Pain Score 02/23/15 0458 8     Pain Loc --      Pain Edu? --      Excl. in [place]? --     Constitutional: Alert and oriented. Well appearing and in no acute distress. Eyes: Conjunctivae are normal. PERRL. EOMI. Head: Atraumatic. Nose: No congestion/rhinnorhea. Mouth/Throat: Mucous membranes are moist.  Oropharynx non-erythematous. Neck: No stridor.  No carotid bruits. Cardiovascular: Normal rate, regular rhythm. Grossly normal heart sounds.  Good peripheral circulation. Respiratory: Normal respiratory effort.  No retractions. Lungs CTAB. Gastrointestinal: Soft and nontender. No distention. No abdominal bruits. No CVA tenderness. Musculoskeletal: No lower extremity tenderness nor edema.  No joint effusions. Neurologic:  Normal speech and language. No gross focal neurologic deficits are appreciated. No gait instability. Skin:  Skin is warm, dry and intact. No rash noted. Psychiatric: Mood and affect are normal. Speech and behavior are normal.  ____________________________________________   LABS (all labs ordered are listed, but only abnormal results are displayed)  Labs Reviewed  CBC - Abnormal; Notable for the following:    WBC 13.3 (*)    All other components within normal limits  COMPREHENSIVE METABOLIC PANEL - Abnormal; Notable for the following:    Glucose, Bld 217 (*)    All other components within normal limits  TROPONIN I   ____________________________________________  EKG  ED ECG REPORT I, SUNG,JADE J, the attending physician, personally viewed and interpreted this ECG.   Date: 02/23/2015   EKG Time: 0134  Rate: 67  Rhythm: normal EKG, normal sinus rhythm  Axis: Normal  Intervals: PACs  ST&T Change: Nonspecific  ____________________________________________  RADIOLOGY  Chest 2 view (reviewed by me, interpreted per Dr. Gerilyn Nestle): No active cardiopulmonary disease.  ____________________________________________   PROCEDURES  Procedure(s) performed: None  Critical Care performed: No  ____________________________________________   INITIAL IMPRESSION / ASSESSMENT AND PLAN / ED COURSE  Pertinent labs & imaging results that were available during my care of the patient were reviewed by me and considered in my medical decision making (see chart for details).  54 year old female with a past medical history significant for CAD s/p stent who presents with unstable angina. Will initiate treatment with aspirin, nitroglycerin paste and discuss with hospitalist for admission. ____________________________________________   FINAL CLINICAL IMPRESSION(S) / ED DIAGNOSES  Final diagnoses:  Chest pain, unspecified chest pain type  Unstable angina (HCC)  Palpitations      Paulette Blanch, MD 02/23/15 0740

## 2015-02-23 NOTE — Progress Notes (Signed)
Pt states her gabapentin dose is wrong, they scheduled her 300mg  3 times a day as needed, but pt takes 900mg  3 times a day scheduled. Pt also takes Oxy-IR 5mg  q4hr as needed & they scheduled her for morphine instead, she wants her Oxy-IR not the morphine. MD paged to get this fixed, Dr. Clint GuyHower to put in orders for the corrected meds. No other concerns at this time. Will continue to monitor. Shirley FriarAlexis Miller, RN

## 2015-02-23 NOTE — Progress Notes (Signed)
[facility] Physicians - [place] at [facility]   PATIENT NAME: Jennifer [name]    MR#:  [id number]  DATE OF BIRTH:  06/28/1960  SUBJECTIVE:  CHIEF COMPLAINT:   Chief Complaint  Patient presents with  . Chest Pain  Still in pain. Just finished myoview REVIEW OF SYSTEMS:  Review of Systems  Constitutional: Negative for fever, weight loss, malaise/fatigue and diaphoresis.  HENT: Negative for ear discharge, ear pain, hearing loss, nosebleeds, sore throat and tinnitus.   Eyes: Negative for blurred vision and pain.  Respiratory: Negative for cough, hemoptysis, shortness of breath and wheezing.   Cardiovascular: Positive for chest pain. Negative for palpitations, orthopnea and leg swelling.  Gastrointestinal: Negative for heartburn, nausea, vomiting, abdominal pain, diarrhea, constipation and blood in stool.  Genitourinary: Negative for dysuria, urgency and frequency.  Musculoskeletal: Negative for myalgias and back pain.  Skin: Negative for itching and rash.  Neurological: Negative for dizziness, tingling, tremors, focal weakness, seizures, weakness and headaches.  Psychiatric/Behavioral: Negative for depression. The patient is not nervous/anxious.     DRUG ALLERGIES:   Allergies  Allergen Reactions  . Lithium Other (See Comments)    Blood dyscrasia  . Tegretol [Carbamazepine] Other (See Comments)    Blood dyscrasia when on with Lithium  . Levaquin [Levofloxacin In D5w] Other (See Comments)    "Salty" sensation in mouth. "Hard time to explain it"  . Lodine [Etodolac] Other (See Comments)    Rash, N/V   VITALS:  Blood pressure 173/72, pulse 63, temperature 97.9 F (36.6 C), temperature source Oral, resp. rate 16, height  (1.575 m), weight 85.775 kg (189 lb 1.6 oz), SpO2 100 %. PHYSICAL EXAMINATION:  Physical Exam  Constitutional: She is oriented to person, place, and time and well-developed, well-nourished, and in no distress.  HENT:  Head: Normocephalic and  atraumatic.  Eyes: Conjunctivae and EOM are normal. Pupils are equal, round, and reactive to light.  Neck: Normal range of motion. Neck supple. No tracheal deviation present. No thyromegaly present.  Cardiovascular: Normal rate, regular rhythm and normal heart sounds.   Pulmonary/Chest: Effort normal and breath sounds normal. No respiratory distress. She has no wheezes. She exhibits no tenderness.  Abdominal: Soft. Bowel sounds are normal. She exhibits no distension. There is no tenderness.  Musculoskeletal: Normal range of motion.  Neurological: She is alert and oriented to person, place, and time. No cranial nerve deficit.  Skin: Skin is warm and dry. No rash noted.  Psychiatric: Mood and affect normal.   LABORATORY PANEL:   CBC  Recent Labs Lab 02/23/15 0140  WBC 13.3*  HGB 13.2  HCT 40.3  PLT 243   ------------------------------------------------------------------------------------------------------------------ Chemistries   Recent Labs Lab 02/23/15 0140  NA 141  K 3.5  CL 110  CO2 26  GLUCOSE 217*  BUN 18  CREATININE 0.56  CALCIUM 9.4  AST 17  ALT 20  ALKPHOS 73  BILITOT 0.3   RADIOLOGY:  Dg Chest 2 View  02/23/2015  CLINICAL DATA:  Chest pain under the left breast which woke her up about an hour ago. Difficulty catching breath and jaw started hurting. EXAM: CHEST  2 VIEW COMPARISON:  06/13/2014 FINDINGS: Normal heart size and pulmonary vascularity. No focal airspace disease or consolidation in the lungs. No blunting of costophrenic angles. No pneumothorax. Mediastinal contours appear intact. Degenerative changes in the spine. IMPRESSION: No active cardiopulmonary disease. Electronically Signed   By: Burman Nieves M.D.   On: 02/23/2015 01:58   Nm Myocar Multi W/spect  W/wall Motion / Ef  02/23/2015   Defect 1: There is a medium defect of moderate severity present in the mid inferoseptal, mid inferior and apical inferior location.  Findings consistent with  ischemia.  This is an intermediate risk study.  Nuclear stress EF: 54%.  There was no ST segment deviation noted during stress.    ASSESSMENT AND PLAN:  This is a 54 year old Caucasian female admitted for unstable angina.  1. Chest pain: Unstable angina. Past medical history significant for coronary artery disease status post stenting. Myoview abnormal per cardio. Cath on Monday. 2. CAD: Continue aspirin, Plavix and Imdur 3. Hypertension: Continue lisinopril and metoprolol 4. COPD: Continue inhalers per home regimen 5. Metabolic syndrome: hemoglobin Z6XA1c 9.6. Suggestive of poor control. Scale insulin if needed.  6. Tobacco abuse: NicoDerm patch 7. GI prophylaxis: None 8. DVT prolactins: Heparin     All the records are reviewed and case discussed with Care Management/Social Worker. Management plans discussed with the patient, Dr Kirke CorinArida and they are in agreement.  CODE STATUS: Full code  TOTAL TIME TAKING CARE OF THIS PATIENT: 35 minutes.   More than 50% of the time was spent in counseling/coordination of care: YES  POSSIBLE D/C IN 3-4 DAYS, DEPENDING ON CLINICAL CONDITION.  catheterization on Monday   [facility], Shaniqwa Horsman M.D on 02/23/2015 at 4:30 PM  Between 7am to 6pm - Pager - [phone]  After 6pm go to [email or web address] - password [email or web address] [email or web address]  [facility]  Office  [phone]  CC:  Primary care physician; Carollee Leitzoss, Carrie M, NP

## 2015-02-23 NOTE — Telephone Encounter (Signed)
FYI, Pt is being discharged from hospital today. Pt diagnosis is Chest Pain. Pt is coming in as a new patient. Pt has a appt for 03/01/2015 @1 :00pm. Thank YOu!

## 2015-02-23 NOTE — ED Notes (Addendum)
Pt states she was trying to go to sleep this morning, but was having chest pain, shortness of breath and nausea when she would lay down.  States that her heart felt like it was racing.  Pt states that pain comes and goes, but that it seems to come back when she moved around.   Pt states she has had previous heart attacks, but didn't feel two of them, but this feels similar to the third heart attack she had.  Pt states that she is still getting over bronchitis and "walking" pneumonia.  Pt states that her head and neck started hurting with chest pain.  No shortness of breath or nausea at this time. Pt resting comfortably at this time.

## 2015-02-24 DIAGNOSIS — I25110 Atherosclerotic heart disease of native coronary artery with unstable angina pectoris: Secondary | ICD-10-CM | POA: Diagnosis not present

## 2015-02-24 DIAGNOSIS — I200 Unstable angina: Secondary | ICD-10-CM | POA: Diagnosis not present

## 2015-02-24 LAB — GLUCOSE, CAPILLARY
Glucose-Capillary: 230 mg/dL — ABNORMAL HIGH (ref 65–99)
Glucose-Capillary: 146 mg/dL — ABNORMAL HIGH (ref 65–99)
Glucose-Capillary: 178 mg/dL — ABNORMAL HIGH (ref 65–99)
Glucose-Capillary: 191 mg/dL — ABNORMAL HIGH (ref 65–99)

## 2015-02-24 MED ORDER — ASPIRIN EC 81 MG PO TBEC
81.0000 mg | DELAYED_RELEASE_TABLET | Freq: Every day | ORAL | Status: DC
  Administered 2015-02-25 – 2015-02-27 (×3): 81 mg via ORAL
  Filled 2015-02-24 (×3): qty 1

## 2015-02-24 NOTE — Progress Notes (Signed)
    Subjective:  No further chest pain today. She has ambulated without symptoms. She understands plans for cardiac catheterization Monday.  Objective:  Vital Signs in the last 24 hours: Temp:  [97.5 F (36.4 C)-98.3 F (36.8 C)] 97.5 F (36.4 C) (11/05 1146) Pulse Rate:  [59-83] 59 (11/05 1146) Resp:  [16-19] 16 (11/05 1146) BP: (123-159)/(66-91) 129/76 mmHg (11/05 1146) SpO2:  [97 %-100 %] 100 % (11/05 1146) Weight:  [193 lb (87.544 kg)] 193 lb (87.544 kg) (11/05 0500)  Intake/Output from previous day: 11/04 0701 - 11/05 0700 In: 2726.3 [P.O.:240; I.V.:2486.3] Out: 1950 [Urine:1950]  Physical Exam: Pt is alert and oriented, NAD HEENT: normal Neck: JVP - normal Lungs: CTA bilaterally CV: RRR without murmur or gallop Abd: soft, NT, Positive BS, no hepatomegaly Ext: no C/C/E Skin: warm/dry no rash   Lab Results:  Recent Labs  02/23/15 0140  WBC 13.3*  HGB 13.2  PLT 243    Recent Labs  02/23/15 0140  NA 141  K 3.5  CL 110  CO2 26  GLUCOSE 217*  BUN 18  CREATININE 0.56    Recent Labs  02/23/15 0140 02/23/15 0914  TROPONINI <0.03 <0.03    Cardiac Studies: Myoview scan: Study Result      Defect 1: There is a medium defect of moderate severity present in the mid inferoseptal, mid inferior and apical inferior location.  Findings consistent with ischemia.  This is an intermediate risk study.  Nuclear stress EF: 54%.  There was no ST segment deviation noted during stress.   Assessment/Plan:  1. Unstable angina pectoris: The patient's cardiac markers are negative. Her EKG shows no acute changes. However, she has had resting symptoms of angina consistent with her previous cardiac pain. A nuclear scan shows inferior ischemia. Plans noted for cardiac catheterization. I reviewed this today with the patient and her family.  I have reviewed the risks, indications, and alternatives to cardiac catheterization and possible angioplasty/stenting with the  patient. Risks include but are not limited to bleeding, infection, vascular injury, stroke, myocardial infection, arrhythmia, kidney injury, radiation-related injury in the case of prolonged fluoroscopy use, emergency cardiac surgery, and death. The patient understands the risks of serious complication is low (<1%). She will continue on aspirin, clopidogrel, DVT prophylaxis heparin, a statin drug, beta blocker, and ACE inhibitor.  2. Hyperlipidemia: Continue atorvastatin  3. Essential hypertension: Continue current medical therapy, BP controlled  4. Tobacco abuse: Patient is on a nicotine patch   Jennifer [name], M.D. 02/24/2015, 3:18 PM     

## 2015-02-24 NOTE — Progress Notes (Signed)
[facility] Physicians - [place] at [facility]   PATIENT NAME: Jennifer [name]    MR#:  [id number]  DATE OF BIRTH:  [birth year]  SUBJECTIVE:  CHIEF COMPLAINT:   Chief Complaint  Patient presents with  . Chest Pain   - still has headache, minimal chest pain only on exertion - for cardiac cath on Monday  REVIEW OF SYSTEMS:  Review of Systems  Constitutional: Negative for fever and chills.  HENT: Negative for ear discharge, ear pain and nosebleeds.   Eyes: Negative for blurred vision and double vision.  Respiratory: Negative for cough, shortness of breath and wheezing.   Cardiovascular: Negative for chest pain, palpitations and leg swelling.  Gastrointestinal: Negative for nausea, vomiting, abdominal pain, diarrhea and constipation.  Genitourinary: Negative for dysuria.  Musculoskeletal: Negative for myalgias, back pain and neck pain.       Chronic pain  Neurological: Positive for headaches. Negative for dizziness, tremors, sensory change, speech change, seizures and weakness.    DRUG ALLERGIES:   Allergies  Allergen Reactions  . Lithium Other (See Comments)    Blood dyscrasia  . Tegretol [Carbamazepine] Other (See Comments)    Blood dyscrasia when on with Lithium  . Levaquin [Levofloxacin In D5w] Other (See Comments)    "Salty" sensation in mouth. "Hard time to explain it"  . Lodine [Etodolac] Other (See Comments)    Rash, N/V    VITALS:  Blood pressure 129/76, pulse 59, temperature 97.5 F (36.4 C), temperature source Oral, resp. rate 16, height  (1.575 m), weight 87.544 kg (193 lb), SpO2 100 %.  PHYSICAL EXAMINATION:  Physical Exam  GENERAL:  54 y.o.-year-old patient lying in the bed with no acute distress.  EYES: Pupils equal, round, reactive to light and accommodation. No scleral icterus. Extraocular muscles intact.  HEENT: Head atraumatic, normocephalic. Oropharynx and nasopharynx clear.  NECK:  Supple, no jugular venous distention. No thyroid  enlargement, no tenderness.  LUNGS: Normal breath sounds bilaterally, no wheezing, rales,rhonchi or crepitation. No use of accessory muscles of respiration.  CARDIOVASCULAR: S1, S2 normal. No murmurs, rubs, or gallops.  ABDOMEN: Soft, nontender, nondistended. Bowel sounds present. No organomegaly or mass.  EXTREMITIES: No pedal edema, cyanosis, or clubbing.  NEUROLOGIC: Cranial nerves II through XII are intact. Muscle strength 5/5 in all extremities. Sensation intact. Gait not checked.  PSYCHIATRIC: The patient is alert and oriented x 3.  SKIN: No obvious rash, lesion, or ulcer.    LABORATORY PANEL:   CBC  Recent Labs Lab 02/23/15 0140  WBC 13.3*  HGB 13.2  HCT 40.3  PLT 243   ------------------------------------------------------------------------------------------------------------------  Chemistries   Recent Labs Lab 02/23/15 0140  NA 141  K 3.5  CL 110  CO2 26  GLUCOSE 217*  BUN 18  CREATININE 0.56  CALCIUM 9.4  AST 17  ALT 20  ALKPHOS 73  BILITOT 0.3   ------------------------------------------------------------------------------------------------------------------  Cardiac Enzymes  Recent Labs Lab 02/23/15 0914  TROPONINI <0.03   ------------------------------------------------------------------------------------------------------------------  RADIOLOGY:  Dg Chest 2 View  02/23/2015  CLINICAL DATA:  Chest pain under the left breast which woke her up about an hour ago. Difficulty catching breath and jaw started hurting. EXAM: CHEST  2 VIEW COMPARISON:  06/13/2014 FINDINGS: Normal heart size and pulmonary vascularity. No focal airspace disease or consolidation in the lungs. No blunting of costophrenic angles. No pneumothorax. Mediastinal contours appear intact. Degenerative changes in the spine. IMPRESSION: No active cardiopulmonary disease. Electronically Signed   By: Marisa Cyphers.D.  On: 02/23/2015 01:58   Nm Myocar Multi W/spect W/wall Motion /  Ef  02/23/2015   Defect 1: There is a medium defect of moderate severity present in the mid inferoseptal, mid inferior and apical inferior location.  Findings consistent with ischemia.  This is an intermediate risk study.  Nuclear stress EF: 54%.  There was no ST segment deviation noted during stress.     EKG:   Orders placed or performed in visit on 02/23/15  . EKG 12-Lead  . EKG 12-Lead  . EKG 12-Lead    ASSESSMENT AND PLAN:    54 year old Caucasian female with PMH of CAD admitted for unstable angina.  1. Unstable angina:  troponins negative - myoview with medium defect of moderate severity present in the mid inferoseptal, mid inferior and apical inferior location. - Cardiac catheterization scheduled for this Monday. -Appreciate cardiology consult. -Continue aspirin, Plavix, metoprolol and statin  2. CAD: Management as mentioned above. For cardiac catheterization on Monday  3. Hypertension: Continue lisinopril and metoprolol  4. COPD: Stable. Continue inhalers per home regimen  5. DM: hemoglobin A1c 9.6. Not taking any medications at home  -Start metformin at discharge. Hold for now as cardiac catheterization is scheduled. -Continue sliding scale insulin  6. Tobacco abuse: NicoDerm patch  7. Chronic pain-on gabapentin and when necessary pain medications.  8. DVT prolactins: Heparin     All the records are reviewed and case discussed with Care Management/Social Workerr. Management plans discussed with the patient, family and they are in agreement.  CODE STATUS: Full Code  TOTAL TIME TAKING CARE OF THIS PATIENT: 36 minutes.   POSSIBLE D/C IN 2-3 DAYS, DEPENDING ON CLINICAL CONDITION.   [name],[name] on 02/24/2015 at 1:02 PM  Between 7am to 6pm - Pager - [phone]  After 6pm go to [email or web address] - password [email or web address] [email or web address]  [place] [facility]  Office  [phone]  CC: Primary care physician; Carollee Leitzoss, Carrie M, NP

## 2015-02-25 DIAGNOSIS — I25110 Atherosclerotic heart disease of native coronary artery with unstable angina pectoris: Secondary | ICD-10-CM | POA: Diagnosis not present

## 2015-02-25 LAB — BASIC METABOLIC PANEL
ANION GAP: 3 — AB (ref 5–15)
BUN: 17 mg/dL (ref 6–20)
CHLORIDE: 111 mmol/L (ref 101–111)
CO2: 28 mmol/L (ref 22–32)
CREATININE: 0.58 mg/dL (ref 0.44–1.00)
Calcium: 9 mg/dL (ref 8.9–10.3)
GFR calc non Af Amer: >60 mL/min (ref >60)
Glucose, Bld: 173 mg/dL — ABNORMAL HIGH (ref 65–99)
POTASSIUM: 4.4 mmol/L (ref 3.5–5.1)
SODIUM: 142 mmol/L (ref 135–145)

## 2015-02-25 LAB — GLUCOSE, CAPILLARY
Glucose-Capillary: 194 mg/dL — ABNORMAL HIGH (ref 65–99)
Glucose-Capillary: 135 mg/dL — ABNORMAL HIGH (ref 65–99)
Glucose-Capillary: 258 mg/dL — ABNORMAL HIGH (ref 65–99)
Glucose-Capillary: 166 mg/dL — ABNORMAL HIGH (ref 65–99)

## 2015-02-25 MED ORDER — AMLODIPINE BESYLATE 5 MG PO TABS
5.0000 mg | ORAL_TABLET | Freq: Every day | ORAL | Status: DC
  Administered 2015-02-25 – 2015-02-27 (×3): 5 mg via ORAL
  Filled 2015-02-25 (×3): qty 1

## 2015-02-25 NOTE — Progress Notes (Signed)
Per MD Kalisetti start amlodipine for elevated BP

## 2015-02-25 NOTE — Progress Notes (Signed)
[facility] - [place] at [facility]   PATIENT NAME: Jennifer SenegalLisa [name]    MR#:  [id number]  DATE OF BIRTH:  1960-09-11  SUBJECTIVE:  CHIEF COMPLAINT:   Chief Complaint  Patient presents with  . Chest Pain   - still has headache, complaints of sleep last night. Under a lot of stress. -For cardiac catheterization on Monday  REVIEW OF SYSTEMS:  Review of Systems  Constitutional: Negative for fever and chills.  HENT: Negative for ear discharge, ear pain and nosebleeds.   Eyes: Negative for blurred vision and double vision.  Respiratory: Negative for cough, shortness of breath and wheezing.   Cardiovascular: Negative for chest pain, palpitations and leg swelling.  Gastrointestinal: Negative for nausea, vomiting, abdominal pain, diarrhea and constipation.  Genitourinary: Negative for dysuria.  Musculoskeletal: Negative for myalgias, back pain and neck pain.       Chronic pain  Neurological: Positive for headaches. Negative for dizziness, tremors, sensory change, speech change, seizures and weakness.    DRUG ALLERGIES:   Allergies  Allergen Reactions  . Lithium Other (See Comments)    Blood dyscrasia  . Tegretol [Carbamazepine] Other (See Comments)    Blood dyscrasia when on with Lithium  . Levaquin [Levofloxacin In D5w] Other (See Comments)    "Salty" sensation in mouth. "Hard time to explain it"  . Lodine [Etodolac] Other (See Comments)    Rash, N/V    VITALS:  Blood pressure 154/88, pulse 60, temperature 97.8 F (36.6 C), temperature source Oral, resp. rate 18, height 5\' 2"  (1.575 m), weight 89.948 kg (198 lb 4.8 oz), SpO2 94 %.  PHYSICAL EXAMINATION:  Physical Exam  GENERAL:  54 y.o.-year-old patient lying in the bed with no acute distress.  EYES: Pupils equal, round, reactive to light and accommodation. No scleral icterus. Extraocular muscles intact.  HEENT: Head atraumatic, normocephalic. Oropharynx and nasopharynx clear.  NECK:  Supple, no  jugular venous distention. No thyroid enlargement, no tenderness.  LUNGS: Normal breath sounds bilaterally, no wheezing, rales,rhonchi or crepitation. No use of accessory muscles of respiration.  CARDIOVASCULAR: S1, S2 normal. No murmurs, rubs, or gallops.  ABDOMEN: Soft, nontender, nondistended. Bowel sounds present. No organomegaly or mass.  EXTREMITIES: No pedal edema, cyanosis, or clubbing.  NEUROLOGIC: Cranial nerves II through XII are intact. Muscle strength 5/5 in all extremities. Sensation intact. Gait not checked.  PSYCHIATRIC: The patient is alert and oriented x 3.  SKIN: No obvious rash, lesion, or ulcer.    LABORATORY PANEL:   CBC  Recent Labs Lab 02/23/15 0140  WBC 13.3*  HGB 13.2  HCT 40.3  PLT 243   ------------------------------------------------------------------------------------------------------------------  Chemistries   Recent Labs Lab 02/23/15 0140 02/25/15 0242  NA 141 142  K 3.5 4.4  CL 110 111  CO2 26 28  GLUCOSE 217* 173*  BUN 18 17  CREATININE 0.56 0.58  CALCIUM 9.4 9.0  AST 17  --   ALT 20  --   ALKPHOS 73  --   BILITOT 0.3  --    ------------------------------------------------------------------------------------------------------------------  Cardiac Enzymes  Recent Labs Lab 02/23/15 0914  TROPONINI <0.03   ------------------------------------------------------------------------------------------------------------------  RADIOLOGY:  Nm Myocar Multi W/spect W/wall Motion / Ef  02/23/2015   Defect 1: There is a medium defect of moderate severity present in the mid inferoseptal, mid inferior and apical inferior location.  Findings consistent with ischemia.  This is an intermediate risk study.  Nuclear stress EF: 54%.  There was no ST segment deviation noted during stress.  EKG:   Orders placed or performed in visit on 02/23/15  . EKG 12-Lead  . EKG 12-Lead  . EKG 12-Lead    ASSESSMENT AND PLAN:    54 year old  Caucasian female with PMH of CAD admitted for unstable angina.  1. Unstable angina:  troponins negative - myoview with medium defect of moderate severity present in the mid inferoseptal, mid inferior and apical inferior location. - Cardiac catheterization scheduled for this Monday. -Appreciate cardiology consult. -Continue aspirin, Plavix, metoprolol and statin  2. CAD: Management as mentioned above. For cardiac catheterization on Monday  3. Hypertension: Continue lisinopril and metoprolol  4. COPD: Stable. Continue inhalers per home regimen  5. DM: hemoglobin A1c 9.6. Not taking any medications at home  -Start metformin at discharge. Hold for now as cardiac catheterization is scheduled. -Continue sliding scale insulin  6. Tobacco abuse: NicoDerm patch  7. Chronic pain-on gabapentin and when necessary pain medications. Also has headache- rebound headaches? , tension headache- monitor- prn pain meds  8. DVT prophylaxis: Heparin     All the records are reviewed and case discussed with Care Management/Social Workerr. Management plans discussed with the patient, family and they are in agreement.  CODE STATUS: Full Code  TOTAL TIME TAKING CARE OF THIS PATIENT: 36 minutes.   POSSIBLE D/C IN 1-2 DAYS, DEPENDING ON CLINICAL CONDITION.   Enid Baas M.D on 02/25/2015 at 9:45 AM  Between 7am to 6pm - Pager - [phone]  After 6pm go to [email or web address] - password [email or web address] [facility]  [place] [facility]  Office  [phone]  CC: Primary care physician; Carollee Leitz, NP

## 2015-02-26 ENCOUNTER — Encounter
Admission: EM | Disposition: A | Discharge status: Home / Self Care | Payer: Self-pay | Source: Home / Self Care | Attending: Internal Medicine

## 2015-02-26 DIAGNOSIS — I25110 Atherosclerotic heart disease of native coronary artery with unstable angina pectoris: Secondary | ICD-10-CM | POA: Diagnosis not present

## 2015-02-26 HISTORY — PX: CARDIAC CATHETERIZATION: SHX172

## 2015-02-26 LAB — GLUCOSE, CAPILLARY
Glucose-Capillary: 195 mg/dL — ABNORMAL HIGH (ref 65–99)
Glucose-Capillary: 269 mg/dL — ABNORMAL HIGH (ref 65–99)

## 2015-02-26 LAB — CBC
HCT: 36.5 % (ref 35.0–47.0)
HEMOGLOBIN: 12 g/dL (ref 12.0–16.0)
MCH: 31.5 pg (ref 26.0–34.0)
MCHC: 32.8 g/dL (ref 32.0–36.0)
MCV: 95.9 fL (ref 80.0–100.0)
PLATELETS: 205 10*3/uL (ref 150–440)
RBC: 3.81 MIL/uL (ref 3.80–5.20)
RDW: 13.8 % (ref 11.5–14.5)
WBC: 8.2 10*3/uL (ref 3.6–11.0)

## 2015-02-26 SURGERY — LEFT HEART CATH AND CORONARY ANGIOGRAPHY
Anesthesia: Moderate Sedation

## 2015-02-26 MED ORDER — NITROGLYCERIN 5 MG/ML IV SOLN
INTRAVENOUS | Status: AC
  Filled 2015-02-26: qty 10

## 2015-02-26 MED ORDER — HYDROMORPHONE HCL 1 MG/ML IJ SOLN
INTRAMUSCULAR | Status: AC
  Administered 2015-02-26: 1 mg via INTRAVENOUS
  Filled 2015-02-26: qty 1

## 2015-02-26 MED ORDER — CLOPIDOGREL BISULFATE 75 MG PO TABS
ORAL_TABLET | ORAL | Status: DC | PRN
  Administered 2015-02-26: 300 mg via ORAL

## 2015-02-26 MED ORDER — BIVALIRUDIN BOLUS VIA INFUSION - CUPID
INTRAVENOUS | Status: DC | PRN
  Administered 2015-02-26: 67.425 mg via INTRAVENOUS

## 2015-02-26 MED ORDER — HEPARIN (PORCINE) IN NACL 2-0.9 UNIT/ML-% IJ SOLN
INTRAMUSCULAR | Status: AC
  Filled 2015-02-26: qty 1000

## 2015-02-26 MED ORDER — ASPIRIN 81 MG PO CHEW
CHEWABLE_TABLET | ORAL | Status: DC | PRN
  Administered 2015-02-26: 243 mg via ORAL

## 2015-02-26 MED ORDER — ONDANSETRON HCL 4 MG/2ML IJ SOLN
INTRAMUSCULAR | Status: AC
  Filled 2015-02-26: qty 2

## 2015-02-26 MED ORDER — ONDANSETRON HCL 4 MG/2ML IJ SOLN
INTRAMUSCULAR | Status: DC | PRN
Start: 2015-02-26 — End: 2015-02-26
  Administered 2015-02-26: 4 mg via INTRAVENOUS

## 2015-02-26 MED ORDER — VERAPAMIL HCL 2.5 MG/ML IV SOLN
INTRAVENOUS | Status: DC | PRN
  Administered 2015-02-26: 2.5 mg via INTRACORONARY

## 2015-02-26 MED ORDER — FENTANYL CITRATE (PF) 100 MCG/2ML IJ SOLN
INTRAMUSCULAR | Status: AC
  Filled 2015-02-26: qty 2

## 2015-02-26 MED ORDER — NITROGLYCERIN 0.4 MG SL SUBL
SUBLINGUAL_TABLET | SUBLINGUAL | Status: AC
  Filled 2015-02-26: qty 1

## 2015-02-26 MED ORDER — FENTANYL CITRATE (PF) 100 MCG/2ML IJ SOLN
INTRAMUSCULAR | Status: DC | PRN
  Administered 2015-02-26 (×2): 25 ug via INTRAVENOUS

## 2015-02-26 MED ORDER — FENTANYL CITRATE (PF) 100 MCG/2ML IJ SOLN: 50.0000 ug | Freq: Once | INTRAMUSCULAR | Status: DC

## 2015-02-26 MED ORDER — ASPIRIN 81 MG PO CHEW
CHEWABLE_TABLET | ORAL | Status: AC
  Filled 2015-02-26: qty 3

## 2015-02-26 MED ORDER — HEPARIN SODIUM (PORCINE) 1000 UNIT/ML IJ SOLN
INTRAMUSCULAR | Status: AC
  Filled 2015-02-26: qty 1

## 2015-02-26 MED ORDER — CLOPIDOGREL BISULFATE 75 MG PO TABS
ORAL_TABLET | ORAL | Status: AC
  Filled 2015-02-26: qty 3

## 2015-02-26 MED ORDER — BIVALIRUDIN 250 MG IV SOLR
INTRAVENOUS | Status: AC
  Filled 2015-02-26: qty 250

## 2015-02-26 MED ORDER — MIDAZOLAM HCL 2 MG/2ML IJ SOLN
INTRAMUSCULAR | Status: DC | PRN
  Administered 2015-02-26 (×2): 0.5 mg via INTRAVENOUS
  Administered 2015-02-26: 1 mg via INTRAVENOUS

## 2015-02-26 MED ORDER — HEPARIN SODIUM (PORCINE) 1000 UNIT/ML IJ SOLN
INTRAMUSCULAR | Status: DC | PRN
  Administered 2015-02-26: 4000 [IU] via INTRAVENOUS

## 2015-02-26 MED ORDER — SODIUM CHLORIDE 0.9 % IV SOLN
INTRAVENOUS | Status: AC
  Administered 2015-02-26: 18:00:00 via INTRAVENOUS

## 2015-02-26 MED ORDER — SODIUM CHLORIDE 0.9 % WEIGHT BASED INFUSION
1.0000 mL/kg/h | INTRAVENOUS | Status: DC
Start: 2015-02-26 — End: 2015-02-26
  Administered 2015-02-26: 1 mL/kg/h via INTRAVENOUS

## 2015-02-26 MED ORDER — SODIUM CHLORIDE 0.9 % IV SOLN
250.0000 mg | INTRAVENOUS | Status: DC | PRN
  Administered 2015-02-26: 1.75 mg/kg/h via INTRAVENOUS

## 2015-02-26 MED ORDER — LIVING WELL WITH DIABETES BOOK
Freq: Once | Status: AC
  Administered 2015-02-26: 11:00:00
  Filled 2015-02-26: qty 1

## 2015-02-26 MED ORDER — ENOXAPARIN SODIUM 40 MG/0.4ML ~~LOC~~ SOLN: 40.0000 mg | SUBCUTANEOUS | Status: DC

## 2015-02-26 MED ORDER — MIDAZOLAM HCL 2 MG/2ML IJ SOLN
INTRAMUSCULAR | Status: AC
  Filled 2015-02-26: qty 2

## 2015-02-26 MED ORDER — VERAPAMIL HCL 2.5 MG/ML IV SOLN
INTRAVENOUS | Status: AC
  Filled 2015-02-26: qty 2

## 2015-02-26 MED ORDER — ONDANSETRON HCL 4 MG/2ML IJ SOLN
INTRAMUSCULAR | Status: AC
  Administered 2015-02-26: 4 mg via ORAL
  Filled 2015-02-26: qty 2

## 2015-02-26 MED ORDER — IOHEXOL 300 MG/ML  SOLN
INTRAMUSCULAR | Status: DC | PRN
  Administered 2015-02-26: 55 mL via INTRA_ARTERIAL
  Administered 2015-02-26: 10 mL via INTRA_ARTERIAL
  Administered 2015-02-26: 75 mL via INTRA_ARTERIAL

## 2015-02-26 SURGICAL SUPPLY — 13 items
BALLN TREK RX 2.5X12 (BALLOONS) ×3
BALLOON TREK RX 2.5X12 (BALLOONS) ×1 IMPLANT
CATH OPTITORQUE JACKY 4.0 5F (CATHETERS) ×3 IMPLANT
CATH VISTA GUIDE 6FR JR4 (CATHETERS) ×3 IMPLANT
DEVICE INFLAT 30 PLUS (MISCELLANEOUS) ×3 IMPLANT
DEVICE RAD COMP TR BAND LRG (VASCULAR PRODUCTS) ×3 IMPLANT
DEVICE RAD TR BAND REGULAR (VASCULAR PRODUCTS) IMPLANT
GLIDESHEATH SLEND SS 6F .021 (SHEATH) ×3 IMPLANT
KIT MANI 3VAL PERCEP (MISCELLANEOUS) ×3 IMPLANT
PACK CARDIAC CATH (CUSTOM PROCEDURE TRAY) ×3 IMPLANT
STENT XIENCE ALPINE RX 2.75X15 (Permanent Stent) ×3 IMPLANT
WIRE RUNTHROUGH .014X180CM (WIRE) ×3 IMPLANT
WIRE SAFE-T 1.5MM-J .035X260CM (WIRE) ×3 IMPLANT

## 2015-02-26 NOTE — Progress Notes (Signed)
Pt transfer back to 245 from specials. R radial site WNL; no complaints of pain; no hematoma or bleeding noted at site. Pink arm band placed on R arm; pt educated on keeping extremity immobilized for next 24hours.

## 2015-02-26 NOTE — Progress Notes (Signed)
[facility] - [place] [facility]   PATIENT NAME: Jennifer [name]    MR#:  [id number]  DATE OF BIRTH:  08-29-1960  SUBJECTIVE:  CHIEF COMPLAINT:   Chief Complaint  Patient presents with  . Chest Pain   - Still complains of headaches. Had chest pain on exertion this morning. For cardiac catheterization today. -Complains of lot of stress and anxiety  REVIEW OF SYSTEMS:  Review of Systems  Constitutional: Negative for fever and chills.  HENT: Negative for ear discharge, ear pain and nosebleeds.   Eyes: Negative for blurred vision and double vision.  Respiratory: Negative for cough, shortness of breath and wheezing.   Cardiovascular: Positive for chest pain. Negative for palpitations and leg swelling.  Gastrointestinal: Negative for nausea, vomiting, abdominal pain, diarrhea and constipation.  Genitourinary: Negative for dysuria.  Musculoskeletal: Negative for myalgias, back pain and neck pain.       Chronic pain  Neurological: Positive for headaches. Negative for dizziness, tremors, sensory change, speech change, seizures and weakness.  Psychiatric/Behavioral: The patient is nervous/anxious.     DRUG ALLERGIES:   Allergies  Allergen Reactions  . Lithium Other (See Comments)    Blood dyscrasia  . Tegretol [Carbamazepine] Other (See Comments)    Blood dyscrasia when on with Lithium  . Levaquin [Levofloxacin In D5w] Other (See Comments)    "Salty" sensation in mouth. "Hard time to explain it"  . Lodine [Etodolac] Other (See Comments)    Rash, N/V    VITALS:  Blood pressure 137/92, pulse 49, temperature 97.1 F (36.2 C), temperature source Oral, resp. rate 14, height  (1.575 m), weight 89.903 kg (198 lb 3.2 oz), SpO2 98 %.  PHYSICAL EXAMINATION:  Physical Exam  GENERAL:  54 y.o.-year-old patient lying in the bed with no acute distress.  EYES: Pupils equal, round, reactive to light and accommodation. No scleral icterus. Extraocular muscles  intact.  HEENT: Head atraumatic, normocephalic. Oropharynx and nasopharynx clear.  NECK:  Supple, no jugular venous distention. No thyroid enlargement, no tenderness.  LUNGS: Normal breath sounds bilaterally, no wheezing, rales,rhonchi or crepitation. No use of accessory muscles of respiration.  CARDIOVASCULAR: S1, S2 normal. No murmurs, rubs, or gallops.  ABDOMEN: Soft, nontender, nondistended. Bowel sounds present. No organomegaly or mass.  EXTREMITIES: No pedal edema, cyanosis, or clubbing.  NEUROLOGIC: Cranial nerves II through XII are intact. Muscle strength 5/5 in all extremities. Sensation intact. Gait not checked.  PSYCHIATRIC: The patient is alert and oriented x 3.  SKIN: No obvious rash, lesion, or ulcer.    LABORATORY PANEL:   CBC  Recent Labs Lab 02/26/15 0322  WBC 8.2  HGB 12.0  HCT 36.5  PLT 205   ------------------------------------------------------------------------------------------------------------------  Chemistries   Recent Labs Lab 02/23/15 0140 02/25/15 0242  NA 141 142  K 3.5 4.4  CL 110 111  CO2 26 28  GLUCOSE 217* 173*  BUN 18 17  CREATININE 0.56 0.58  CALCIUM 9.4 9.0  AST 17  --   ALT 20  --   ALKPHOS 73  --   BILITOT 0.3  --    ------------------------------------------------------------------------------------------------------------------  Cardiac Enzymes  Recent Labs Lab 02/23/15 0914  TROPONINI <0.03   ------------------------------------------------------------------------------------------------------------------  RADIOLOGY:  No results found.  EKG:   Orders placed or performed during the hospital encounter of 02/23/15  . EKG 12-Lead immediately post procedure  . EKG 12-Lead  . EKG 12-Lead immediately post procedure    ASSESSMENT AND PLAN:    54 year old Caucasian female  with PMH of CAD admitted for unstable angina.  1. Unstable angina:  troponins negative - myoview abnormal with medium defect of moderate  severity present in the mid inferoseptal, mid inferior and apical inferior location. - Cardiac catheterization scheduled for today. -Appreciate cardiology consult. -Continue aspirin, Plavix, metoprolol and statin  2. CAD: Management as mentioned above. For cardiac catheterization today  3. Hypertension: Continue lisinopril and metoprolol. Norvasc added for better control  4. COPD: Stable. Continue inhalers per home regimen  5. DM: hemoglobin A1c 9.6. Not taking any medications at home  -Start metformin at discharge. Hold for now as cardiac catheterization is scheduled. -Continue sliding scale insulin  6. Tobacco abuse: NicoDerm patch  7. Chronic pain-on gabapentin and when necessary pain medications. Also has headache- rebound headaches? , tension headache- monitor- prn pain meds Not on any nitroglycerin medications for the same  8. DVT prophylaxis: Heparin   For cardiac catheterization today  All the records are reviewed and case discussed with Care Management/Social Workerr. Management plans discussed with the patient, family and they are in agreement.  CODE STATUS: Full Code  TOTAL TIME TAKING CARE OF THIS PATIENT: 36 minutes.   POSSIBLE D/C TOMORROW, DEPENDING ON CLINICAL CONDITION.   [name],[name] M.D on 02/26/2015 at 2:11 PM  Between 7am to 6pm - Pager - [phone]  After 6pm go to [email or web address] - password [email or web address] [facility]  [facility]  Office  [phone]  CC: Primary care physician; Carollee Leitzoss, Carrie M, NP

## 2015-02-26 NOTE — Progress Notes (Signed)
TR Band applied with 15cc air

## 2015-02-26 NOTE — H&P (View-Only) (Signed)
    Subjective:  No further chest pain today. She has ambulated without symptoms. She understands plans for cardiac catheterization Monday.  Objective:  Vital Signs in the last 24 hours: Temp:  [97.5 F (36.4 C)-98.3 F (36.8 C)] 97.5 F (36.4 C) (11/05 1146) Pulse Rate:  [59-83] 59 (11/05 1146) Resp:  [16-19] 16 (11/05 1146) BP: (123-159)/(66-91) 129/76 mmHg (11/05 1146) SpO2:  [97 %-100 %] 100 % (11/05 1146) Weight:  [193 lb (87.544 kg)] 193 lb (87.544 kg) (11/05 0500)  Intake/Output from previous day: 11/04 0701 - 11/05 0700 In: 2726.3 [P.O.:240; I.V.:2486.3] Out: 1950 [Urine:1950]  Physical Exam: Pt is alert and oriented, NAD HEENT: normal Neck: JVP - normal Lungs: CTA bilaterally CV: RRR without murmur or gallop Abd: soft, NT, Positive BS, no hepatomegaly Ext: no C/C/E Skin: warm/dry no rash   Lab Results:  Recent Labs  02/23/15 0140  WBC 13.3*  HGB 13.2  PLT 243    Recent Labs  02/23/15 0140  NA 141  K 3.5  CL 110  CO2 26  GLUCOSE 217*  BUN 18  CREATININE 0.56    Recent Labs  02/23/15 0140 02/23/15 0914  TROPONINI <0.03 <0.03    Cardiac Studies: Myoview scan: Study Result      Defect 1: There is a medium defect of moderate severity present in the mid inferoseptal, mid inferior and apical inferior location.  Findings consistent with ischemia.  This is an intermediate risk study.  Nuclear stress EF: 54%.  There was no ST segment deviation noted during stress.   Assessment/Plan:  1. Unstable angina pectoris: The patient's cardiac markers are negative. Her EKG shows no acute changes. However, she has had resting symptoms of angina consistent with her previous cardiac pain. A nuclear scan shows inferior ischemia. Plans noted for cardiac catheterization. I reviewed this today with the patient and her family.  I have reviewed the risks, indications, and alternatives to cardiac catheterization and possible angioplasty/stenting with the  patient. Risks include but are not limited to bleeding, infection, vascular injury, stroke, myocardial infection, arrhythmia, kidney injury, radiation-related injury in the case of prolonged fluoroscopy use, emergency cardiac surgery, and death. The patient understands the risks of serious complication is low (<1%). She will continue on aspirin, clopidogrel, DVT prophylaxis heparin, a statin drug, beta blocker, and ACE inhibitor.  2. Hyperlipidemia: Continue atorvastatin  3. Essential hypertension: Continue current medical therapy, BP controlled  4. Tobacco abuse: Patient is on a nicotine patch   Tonny Bollmanooper, Seri Kimmer, M.D. 02/24/2015, 3:18 PM

## 2015-02-26 NOTE — Progress Notes (Signed)
Inpatient Diabetes Program Recommendations  AACE/ADA: New Consensus Statement on Inpatient Glycemic Control (2015)  Target Ranges:  Prepandial:   less than 140 mg/dL      Peak postprandial:   less than 180 mg/dL (1-2 hours)      Critically ill patients:  140 - 180 mg/dL   Review of Glycemic Control  Results for Jennifer [name], Jennifer [name] (MRN [id number]) as of 02/26/2015 09:01  Ref. Range 02/24/2015 22:17 02/25/2015 07:32 02/25/2015 12:00 02/25/2015 16:09 02/25/2015 20:54  Glucose-Capillary Latest Ref Range: 65-99 mg/dL 409191 (H) 811194 (H) 914135 (H) 258 (H) 166 (H)    Diabetes history: A1C 9.6%- checks sugar at home  Outpatient Diabetes medications: none Current orders for Inpatient glycemic control: Novolog 0-9 units tid, Novolog 0-5 units qhs   A1C of 9.6% - 03/01/15 appointment scheduled with NP Naomie Deanarrie Doss per chart note  Inpatient Diabetes Program Recommendations: Continue Novolog correction insulin as ordered. Consider discharging patient home on Metformin.  Will likely need dual therapy to control blood sugars.  Susette RacerJulie Giliana Vantil, RN, BA, MHA, CDE Diabetes Coordinator Inpatient Diabetes Program  [phone] (Team Pager) [phone] [facility] Office) 02/26/2015 9:06 AM

## 2015-02-26 NOTE — Progress Notes (Signed)
Arm immobilizer obtained from pediatrics and placed on R arm for protection.

## 2015-02-26 NOTE — Progress Notes (Signed)
TR Band applied to wrist with cc air

## 2015-02-26 NOTE — Progress Notes (Signed)
Pt requesting for IVF to be stopped, states "this is aggravating me" MD, Hower notified. Ok per MD to d/c fluids.

## 2015-02-26 NOTE — Interval H&P Note (Signed)
History and Physical Interval Note:  02/26/2015 12:50 PM  Jennifer [name]  has presented today for surgery, with the diagnosis of chest pain  The various methods of treatment have been discussed with the patient and family. After consideration of risks, benefits and other options for treatment, the patient has consented to  Procedure(s): Left Heart Cath and Coronary Angiography (N/A) as a surgical intervention .  The patient's history has been reviewed, patient examined, no change in status, stable for surgery.  I have reviewed the patient's chart and labs.  Questions were answered to the patient's satisfaction.     Lorine BearsMuhammad Arida

## 2015-02-27 ENCOUNTER — Encounter: Payer: Self-pay | Admitting: Cardiovascular Disease

## 2015-02-27 ENCOUNTER — Telehealth: Payer: Self-pay

## 2015-02-27 DIAGNOSIS — I25110 Atherosclerotic heart disease of native coronary artery with unstable angina pectoris: Secondary | ICD-10-CM | POA: Diagnosis not present

## 2015-02-27 LAB — GLUCOSE, CAPILLARY
Glucose-Capillary: 192 mg/dL — ABNORMAL HIGH (ref 65–99)
Glucose-Capillary: 219 mg/dL — ABNORMAL HIGH (ref 65–99)

## 2015-02-27 LAB — CBC
HEMATOCRIT: 36.3 % (ref 35.0–47.0)
Hemoglobin: 11.8 g/dL — ABNORMAL LOW (ref 12.0–16.0)
MCH: 31.3 pg (ref 26.0–34.0)
MCHC: 32.5 g/dL (ref 32.0–36.0)
MCV: 96.2 fL (ref 80.0–100.0)
Platelets: 198 10*3/uL (ref 150–440)
RBC: 3.77 MIL/uL — ABNORMAL LOW (ref 3.80–5.20)
RDW: 13.9 % (ref 11.5–14.5)
WBC: 8 10*3/uL (ref 3.6–11.0)

## 2015-02-27 LAB — BASIC METABOLIC PANEL
Anion gap: 4 — ABNORMAL LOW (ref 5–15)
BUN: 14 mg/dL (ref 6–20)
CHLORIDE: 108 mmol/L (ref 101–111)
CO2: 31 mmol/L (ref 22–32)
Calcium: 9.1 mg/dL (ref 8.9–10.3)
Creatinine, Ser: 0.58 mg/dL (ref 0.44–1.00)
GFR calc Af Amer: >60 mL/min (ref >60)
GLUCOSE: 168 mg/dL — AB (ref 65–99)
POTASSIUM: 4.2 mmol/L (ref 3.5–5.1)
Sodium: 143 mmol/L (ref 135–145)

## 2015-02-27 MED ORDER — OXYCODONE HCL 5 MG PO TABS: 5.0000 mg | ORAL_TABLET | Freq: Four times a day (QID) | ORAL | Status: DC | PRN

## 2015-02-27 MED ORDER — CARISOPRODOL 350 MG PO TABS: 350.0000 mg | ORAL_TABLET | Freq: Three times a day (TID) | ORAL | Status: DC | PRN

## 2015-02-27 MED ORDER — AMLODIPINE BESYLATE 5 MG PO TABS: 5.0000 mg | ORAL_TABLET | Freq: Every day | ORAL | Status: DC

## 2015-02-27 MED ORDER — ATORVASTATIN CALCIUM 40 MG PO TABS: 40.0000 mg | ORAL_TABLET | Freq: Every day | ORAL | Status: DC

## 2015-02-27 MED ORDER — COMBIVENT RESPIMAT 20-100 MCG/ACT IN AERS: 1.0000 | INHALATION_SPRAY | Freq: Four times a day (QID) | RESPIRATORY_TRACT | Status: DC | PRN

## 2015-02-27 MED ORDER — CLOPIDOGREL BISULFATE 75 MG PO TABS: 75.0000 mg | ORAL_TABLET | Freq: Every day | ORAL | Status: DC

## 2015-02-27 MED ORDER — METFORMIN HCL 500 MG PO TABS: 500.0000 mg | ORAL_TABLET | Freq: Two times a day (BID) | ORAL | Status: DC

## 2015-02-27 MED ORDER — METOPROLOL SUCCINATE ER 200 MG PO TB24: 200.0000 mg | ORAL_TABLET | Freq: Every day | ORAL | Status: DC

## 2015-02-27 MED ORDER — LISINOPRIL 40 MG PO TABS: 40.0000 mg | ORAL_TABLET | Freq: Every day | ORAL | Status: DC

## 2015-02-27 MED ORDER — GABAPENTIN 300 MG PO CAPS: 900.0000 mg | ORAL_CAPSULE | Freq: Three times a day (TID) | ORAL | Status: DC

## 2015-02-27 MED ORDER — ASPIRIN 81 MG PO TBEC: 81.0000 mg | DELAYED_RELEASE_TABLET | Freq: Every day | ORAL | Status: DC

## 2015-02-27 MED ORDER — HYDROMORPHONE HCL 2 MG PO TABS: 2.0000 mg | ORAL_TABLET | Freq: Two times a day (BID) | ORAL | Status: DC | PRN

## 2015-02-27 NOTE — Progress Notes (Signed)
Inpatient Diabetes Program Recommendations  AACE/ADA: New Consensus Statement on Inpatient Glycemic Control (2015)  Target Ranges:  Prepandial:   less than 140 mg/dL      Peak postprandial:   less than 180 mg/dL (1-2 hours)      Critically ill patients:  140 - 180 mg/dL   Review of Glycemic Control  Met with patient at the bedside.  Discussed the need to check blood sugars at home- she has had a meter in the past and knows how to use it.  She will need a prescription for a meter and supplies on discharge.  Instructed on the properties of Metformin, timing and possible GI upset- instructed to take it on a full stomach and instructed that the stomach upset should go away after a couple of weeks.  She discussed her strong desire not to take medication because she "fell out" when she took a medication in the past (my guess was she was on Glipizide).  I have discussed the possible need for additional medication when she sees her doctor later this week.   Leah instructed the patient to resume Metformin 48 hours after her cardiac cath because of the dye used during the procedure.   Discussed A1C, the need to eat three meals a day- (she eats "brunch" at 10 or 11am, supper and "I don't usually eat a bedtime snack" but then indicated she eats "the other half of my dinner plate at the time you say I should eat a snack").  Tells me she eats lean meats and avoids cheese. Suggested she attend the [facility] for further diabetes and nutrition education.    She expressed concern about what she could do to improve her health but posed resistance when RN Leah and I suggested she will need to make changes to her diet (eat three meals, "I know what to eat because I've dieted", "I thought fruit was good for you", "I don't eat sweets", "I don't cook bacon and sausage").  She talked excessively and would not allow [place] the opportunity to share information with her.  She told us she couldn't afford a meter and strips,  and she needed to be given a blood pressure cuff (we'll get MD to give a prescription) to keep track of her BP. Refused Nicotine patch because she was "going to get a cigarette".  She reports difficulty in quitting smoking because she's under tremendous stress.   MD- please give the patient prescriptions for Glucometer, testing strips, lancets and a BP cuff.   Reminded patient (she had it written down) of her appointment with MD on 03/01/15.  Gentry Fitz, RN, BA, MHA, CDE Diabetes Coordinator Inpatient Diabetes Program  [phone] (Team Pager) [phone] ([place]) 02/27/2015 9:22 AM

## 2015-02-27 NOTE — Progress Notes (Signed)
Discharge instructions given. IV and tele discontinued. Patient instructed on chest pain, vascular access site care, diabetes, how to take a pulse and blood pressure. Patient has also been educated by diabetes coordinator and is to follow up with programs outpatient. Will follow up with PCP and cardiology. Patient has no further questions.

## 2015-02-27 NOTE — Discharge Summary (Signed)
[facility] [place] NAME: Jennifer [name]    MR#:  [id number]  DATE OF BIRTH:  November 25, 1960  DATE OF ADMISSION:  02/23/2015 ADMITTING PHYSICIAN: Harrie Foreman, MD  DATE OF DISCHARGE: 02/27/2015  PRIMARY CARE PHYSICIAN: Rubbie Battiest, NP    ADMISSION DIAGNOSIS:  Palpitations [R00.2] Unstable angina ([place]) [I20.0] Chest pain, unspecified chest pain type [R07.9]  DISCHARGE DIAGNOSIS:  Active Problems:   Unstable angina ([place])   SECONDARY DIAGNOSIS:   Past Medical History  Diagnosis Date  . Hypertension   . High cholesterol   . Heart murmur   . Myocardial infarction ([place]) 2008; ~ 2012  . Chronic bronchitis ([place])     "get it q yr"  . Pneumonia     "get it 1-2 times/year" (06/14/2014)  . Sleep apnea     "they want me to do the lab but I haven't" (06/14/2014)  . RLS (restless legs syndrome)   . Type II diabetes mellitus ([place])   . GERD (gastroesophageal reflux disease)   . History of stomach ulcers   . Daily headache     "when my blood pressure is up" (06/14/2014)  . Migraine     "haven't had them in a good while; did get them 1-2 times/yr" (06/14/2014)  . Seizures ([place])   . Stroke [facility])     "they say I've had several" (06/14/2014)  . Arthritis     "qwhere" (06/14/2014)  . Anxiety   . Depression   . Bipolar disorder ([place])   . Kidney stones   . Urinary, incontinence, stress female   . Insomnia     HOSPITAL COURSE:   54 year old Caucasian female with PMH of CAD admitted for unstable angina.  1. Unstable angina: troponins negative - myoview abnormal with medium defect of moderate severity present in the mid inferoseptal, mid inferior and apical inferior location. - Cardiac catheterization showing 99% in-stent stenosis of the RCA distal stent. -Balloon angioplasty done and new stent placed.. -Appreciate cardiology consult. -Continue aspirin, Plavix, metoprolol and statin -No Imdur due to chronic headaches -Smoking  cessation advised  2. CAD: Management as mentioned above.  -New RCA stent placed and continue medical management  3. Hypertension: Continue lisinopril and metoprolol. Norvasc added for better control  4. COPD: Stable. Continue inhalers per home regimen  5. DM: hemoglobin A1c 9.6. Not taking any medications at home  -Being discharged on metformin. To be started from tomorrow due to cardiac catheterization   6. Tobacco abuse: Strongly counseled against smoking   7. Chronic pain-on gabapentin and PRN chronic pain medications. Headache is better  Patient had cardiac catheterization via her right radial artery. No active bleeding at this time. She is stable for discharge. Ambulating without any discomfort.  DISCHARGE CONDITIONS:   Stable  CONSULTS OBTAINED:  Treatment Team:  Wellington Hampshire, MD  DRUG ALLERGIES:   Allergies  Allergen Reactions  . Lithium Other (See Comments)    Blood dyscrasia  . Tegretol [Carbamazepine] Other (See Comments)    Blood dyscrasia when on with Lithium  . Levaquin [Levofloxacin In D5w] Other (See Comments)    "Salty" sensation in mouth. "Hard time to explain it"  . Lodine [Etodolac] Other (See Comments)    Rash, N/V    DISCHARGE MEDICATIONS:   Current Discharge Medication List    START taking these medications   Details  amLODipine (NORVASC) 5 MG tablet Take 1 tablet (5 mg total) by mouth daily. Qty: 30 tablet,  Refills: 2    aspirin EC 81 MG EC tablet Take 1 tablet (81 mg total) by mouth daily. Qty: 30 tablet, Refills: 2    metFORMIN (GLUCOPHAGE) 500 MG tablet Take 1 tablet (500 mg total) by mouth 2 (two) times daily with a meal. Qty: 60 tablet, Refills: 2    oxyCODONE (OXY IR/ROXICODONE) 5 MG immediate release tablet Take 1 tablet (5 mg total) by mouth every 6 (six) hours as needed for moderate pain. Qty: 15 tablet, Refills: 0      CONTINUE these medications which have CHANGED   Details  atorvastatin (LIPITOR) 40 MG tablet Take  1 tablet (40 mg total) by mouth daily at 6 PM. Qty: 30 tablet, Refills: 2    carisoprodol (SOMA) 350 MG tablet Take 1 tablet (350 mg total) by mouth 3 (three) times daily as needed (muscle spasms). Qty: 30 tablet, Refills: 0    clopidogrel (PLAVIX) 75 MG tablet Take 1 tablet (75 mg total) by mouth daily with breakfast. Qty: 30 tablet, Refills: 2    COMBIVENT RESPIMAT 20-100 MCG/ACT AERS respimat Inhale 1 puff into the lungs every 6 (six) hours as needed for wheezing or shortness of breath. Qty: 1 Inhaler, Refills: 2    gabapentin (NEURONTIN) 300 MG capsule Take 3 capsules (900 mg total) by mouth 3 (three) times daily. Qty: 30 capsule, Refills: 0    HYDROmorphone (DILAUDID) 2 MG tablet Take 1 tablet (2 mg total) by mouth every 12 (twelve) hours as needed for severe pain. Qty: 10 tablet, Refills: 0    lisinopril (PRINIVIL,ZESTRIL) 40 MG tablet Take 1 tablet (40 mg total) by mouth daily. Qty: 30 tablet, Refills: 2    metoprolol succinate (TOPROL-XL) 200 MG 24 hr tablet Take 1 tablet (200 mg total) by mouth daily. Take with or immediately following a meal. Qty: 30 tablet, Refills: 02      CONTINUE these medications which have NOT CHANGED   Details  Multiple Vitamins-Minerals (MULTIVITAMIN WITH MINERALS) tablet Take 1 tablet by mouth daily.    mupirocin ointment (BACTROBAN) 2 % Place 1 application into the nose 2 (two) times daily. Qty: 22 g, Refills: 0    VENTOLIN HFA 108 (90 BASE) MCG/ACT inhaler Inhale 1-2 puffs into the lungs every 6 (six) hours as needed for wheezing or shortness of breath.     blood glucose meter kit and supplies KIT Dispense based on patient and insurance preference. Use up to three times daily blood sugar checks as directed. (FOR ICD-9 250.00, 250.01). Qty: 1 each, Refills: 0      STOP taking these medications     oxycodone (OXY-IR) 5 MG capsule      isosorbide mononitrate (IMDUR) 30 MG 24 hr tablet          DISCHARGE INSTRUCTIONS:   1. PCP  follow-up in 1 week 2. Cardiology follow-up in 2 weeks 3. Smoking cessation advised  If you experience worsening of your admission symptoms, develop shortness of breath, life threatening emergency, suicidal or homicidal thoughts you must seek medical attention immediately by calling 911 or calling your MD immediately  if symptoms less severe.  You Must read complete instructions/literature along with all the possible adverse reactions/side effects for all the Medicines you take and that have been prescribed to you. Take any new Medicines after you have completely understood and accept all the possible adverse reactions/side effects.   Please note  You were cared for by a hospitalist during your hospital stay. If you have any questions  about your discharge medications or the care you received while you were in the hospital after you are discharged, you can call the unit and asked to speak with the hospitalist on call if the hospitalist that took care of you is not available. Once you are discharged, your primary care physician will handle any further medical issues. Please note that NO REFILLS for any discharge medications will be authorized once you are discharged, as it is imperative that you return to your primary care physician (or establish a relationship with a primary care physician if you do not have one) for your aftercare needs so that they can reassess your need for medications and monitor your lab values.    Today   CHIEF COMPLAINT:   Chief Complaint  Patient presents with  . Chest Pain    VITAL SIGNS:  Blood pressure 138/72, pulse 71, temperature 97.7 F (36.5 C), temperature source Oral, resp. rate 20, height $RemoveBe'5\' 2"'ajMXQhmSX$  (1.575 m), weight 89.812 kg (198 lb), SpO2 97 %.  I/O:   Intake/Output Summary (Last 24 hours) at 02/27/15 0955 Last data filed at 02/27/15 0745  Gross per 24 hour  Intake    300 ml  Output   4250 ml  Net  -3950 ml    PHYSICAL EXAMINATION:   Physical  Exam   GENERAL: 54 y.o.-year-old patient lying in the bed with no acute distress.  EYES: Pupils equal, round, reactive to light and accommodation. No scleral icterus. Extraocular muscles intact.  HEENT: Head atraumatic, normocephalic. Oropharynx and nasopharynx clear.  NECK: Supple, no jugular venous distention. No thyroid enlargement, no tenderness.  LUNGS: Normal breath sounds bilaterally, no wheezing, rales,rhonchi or crepitation. No use of accessory muscles of respiration.  CARDIOVASCULAR: S1, S2 normal. No murmurs, rubs, or gallops.  ABDOMEN: Soft, nontender, nondistended. Bowel sounds present. No organomegaly or mass.  EXTREMITIES: No pedal edema, cyanosis, or clubbing.  NEUROLOGIC: Cranial nerves II through XII are intact. Muscle strength 5/5 in all extremities. Sensation intact. Gait not checked.  PSYCHIATRIC: The patient is alert and oriented x 3.  SKIN: No obvious rash, lesion, or ulcer.   DATA REVIEW:   CBC  Recent Labs Lab 02/27/15 0436  WBC 8.0  HGB 11.8*  HCT 36.3  PLT 198    Chemistries   Recent Labs Lab 02/23/15 0140  02/27/15 0436  NA 141  < > 143  K 3.5  < > 4.2  CL 110  < > 108  CO2 26  < > 31  GLUCOSE 217*  < > 168*  BUN 18  < > 14  CREATININE 0.56  < > 0.58  CALCIUM 9.4  < > 9.1  AST 17  --   --   ALT 20  --   --   ALKPHOS 73  --   --   BILITOT 0.3  --   --   < > = values in this interval not displayed.  Cardiac Enzymes  Recent Labs Lab 02/23/15 0914  TROPONINI <0.03    Microbiology Results  Results for orders placed or performed in visit on 09/30/13  Culture, blood (single)     Status: None   Collection Time: 09/29/13 11:39 PM  Result Value Ref Range Status   Micro Text Report   Final       COMMENT                   NO GROWTH AEROBICALLY/ANAEROBICALLY IN 5 DAYS   ANTIBIOTIC  Culture, blood (single)     Status: None   Collection Time: 09/29/13 11:39 PM  Result  Value Ref Range Status   Micro Text Report   Final       COMMENT                   NO GROWTH AEROBICALLY/ANAEROBICALLY IN 5 DAYS   ANTIBIOTIC                                                      Urine culture     Status: None   Collection Time: 09/30/13 10:37 PM  Result Value Ref Range Status   Micro Text Report   Final       SOURCE: CLEAN CATCH    ORGANISM 1                1000 CFU/ML GRAM POSITIVE COCCI   ANTIBIOTIC                                                        RADIOLOGY:  No results found.  EKG:   Orders placed or performed during the hospital encounter of 02/23/15  . EKG 12-Lead immediately post procedure  . EKG 12-Lead  . EKG 12-Lead immediately post procedure  . EKG 12-Lead      Management plans discussed with the patient, family and they are in agreement.  CODE STATUS:     Code Status Orders        Start     Ordered   02/26/15 1348  Full code   Continuous     02/26/15 1350      TOTAL TIME TAKING CARE OF THIS PATIENT: 37 minutes.    Gladstone Lighter M.D on 02/27/2015 at 9:55 AM  Between 7am to 6pm - Pager - [phone]  After 6pm go to [email or web address] - password [email or web address] [facility]  Office  [phone]  CC: Primary care physician; Rubbie Battiest, NP

## 2015-02-27 NOTE — Telephone Encounter (Signed)
Transition Care Management Follow-up Telephone Call   Date discharged? 02/27/15   How have you been since you were released from the hospital? Some chest pain still, but nothing like it was.  Also, persistent headache.  My life is so stressful it is probably why I am in the shape I am in.         Do you understand why you were in the hospital? Yes, I do because of these chest pains.  My life is just so stressful.  I have a lot going on.   Do you understand the discharge instructions? Yes, and I am not straining to lift anything or getting up too fast.   Where were you discharged to? Home.   Items Reviewed:  Medications reviewed: Yes, started taking AMLODIPINE, ASPIRIN, METFORMIN and OXY IR/ROXICODONE 5mg  tablet as prescribed.  Stopped taking  IMDUR and OXY IR 5mg  capsule.  Taking all other medications as prescribed.   Allergies reviewed: Yes, no change.  Dietary changes reviewed: Yes, heart healthy diet.  Tolerating well.  Referrals reviewed: Yes   Functional Questionnaire:   Activities of Daily Living (ADLs):   She states they are independent in the following: Ambulating, dressing, bathing, meal prep, toileting, grooming States they require assistance with the following: No assistance requires at this time   Any transportation issues/concerns?: None   Any patient concerns? Given a Rx for Blood pressure monitor and Glucose monitor.  Worried the [employer] insurance will not pay for it and does not know how she is going to maintain the level of health desired   Confirmed importance and date/time of follow-up visits scheduled Yes, appointment scheduled for 03/01/15 at 1pm  Provider Appointment booked with Naomie Deanarrie Doss, NP (PCP)  Confirmed with patient if condition begins to worsen call PCP or go to the ER.  Patient was given the office number and encouraged to call back with question or concerns.  : Yes, patient verbalized understanding

## 2015-02-27 NOTE — Progress Notes (Signed)
Inpatient Diabetes Program Recommendations  AACE/ADA: New Consensus Statement on Inpatient Glycemic Control (2015)  Target Ranges:  Prepandial:   less than 140 mg/dL      Peak postprandial:   less than 180 mg/dL (1-2 hours)      Critically ill patients:  140 - 180 mg/dL   Review of Glycemic Control  Results for Jennifer [name], Jennifer [name] (MRN [id number]) as of 02/27/2015 07:22  Ref. Range 02/25/2015 12:00 02/25/2015 16:09 02/25/2015 20:54 02/26/2015 17:21 02/26/2015 21:17  Glucose-Capillary Latest Ref Range: 65-99 mg/dL 409135 (H) 811258 (H) 914166 (H) 195 (H) 269 (H)    Diabetes history: A1C 9.6%- checks sugar at home  Outpatient Diabetes medications: none Current orders for Inpatient glycemic control: Novolog 0-9 units tid, Novolog 0-5 units qhs   A1C of 9.6% - 03/01/15 appointment scheduled with NP Naomie Deanarrie Doss per chart note  Inpatient Diabetes Program Recommendations: Continue Novolog correction insulin as ordered. Consider discharging patient home on Metformin. Will likely need dual therapy to control blood sugars.  Living Well with Diabetes book ordered.   Susette RacerJulie Echo Allsbrook, RN, BA, MHA, CDE Diabetes Coordinator Inpatient Diabetes Program  [phone] (Team Pager) [phone] [facility] Office) 02/27/2015 7:24 AM

## 2015-03-01 ENCOUNTER — Encounter: Payer: Self-pay | Admitting: Nurse Practitioner

## 2015-03-01 ENCOUNTER — Ambulatory Visit (INDEPENDENT_AMBULATORY_CARE_PROVIDER_SITE_OTHER): Payer: Medicare Other | Admitting: Nurse Practitioner

## 2015-03-01 VITALS — BP 122/74 | HR 61 | Temp 97.9°F | Wt 190.4 lb

## 2015-03-01 DIAGNOSIS — E118 Type 2 diabetes mellitus with unspecified complications: Secondary | ICD-10-CM

## 2015-03-01 DIAGNOSIS — Z720 Tobacco use: Secondary | ICD-10-CM | POA: Diagnosis not present

## 2015-03-01 DIAGNOSIS — E1165 Type 2 diabetes mellitus with hyperglycemia: Secondary | ICD-10-CM | POA: Diagnosis not present

## 2015-03-01 DIAGNOSIS — Z09 Encounter for follow-up examination after completed treatment for conditions other than malignant neoplasm: Secondary | ICD-10-CM

## 2015-03-01 MED ORDER — NICOTINE 21 MG/24HR TD PT24: 21.0000 mg | MEDICATED_PATCH | Freq: Every day | TRANSDERMAL | Status: DC

## 2015-03-01 NOTE — Patient Instructions (Signed)
Fax [phone] to fax us the form.   Nice to meet you!   Continue watching carbohydrates like pasta, bread, and rice.   Congrats on the decision to stop smoking.   We will follow up on the 22nd and get the rest of your information.   Talk to [facility] or your brother about the blood pressure cuff.

## 2015-03-01 NOTE — Progress Notes (Signed)
Patient ID: Jennifer [name], female    DOB: [birth year]  Age: 54 y.o. MRN: [id number]  CC: Follow-up   HPI Jennifer [name] presents for Hospital follow up (NEW PATIENT).   1) Patient discharged from hospital on 02/27/2015 Discharge diagnosis unstable angina Patient has a chronic heart history She is following instructions on medications to take.  Dr. Fletcher [name] Left heart cath with stent  Cardiology follow up with Jennifer Faith, PA on 03/14/15 scheduled  Smoking cessation- interested in patches Working on stopping for quitting smoking  Goal on Dec. 23rd stopping completely   Amlodipine- doing well on it  Metformin- Took last night and doing well Needs a meter and supplies to check her sugars  History [place] has a past medical history of Hypertension; High cholesterol; Heart murmur; Myocardial infarction (Jennifer [name]) (2008; ~ 2012); Chronic bronchitis (Jennifer [name]); Pneumonia; Sleep apnea; RLS (restless legs syndrome); Type II diabetes mellitus (Jennifer [name]); GERD (gastroesophageal reflux disease); History of stomach ulcers; Daily headache; Migraine; Seizures (Jennifer [name]); Stroke Jennifer [name]); Arthritis; Anxiety; Depression; Bipolar disorder (Jennifer [name]); Kidney stones; Urinary, incontinence, stress female; and Insomnia.   She has past surgical history that includes Tonsillectomy; Knee arthroscopy (Left); Extracorporeal shock wave lithotripsy (X 2); Dilation and curettage of uterus; Cesarean section (1984); Tubal ligation; Abdominal hysterectomy (1984); Coronary angioplasty with stent (2008; ~ 2012); left heart catheterization with coronary angiogram (N/A, 06/15/2014); percutaneous coronary stent intervention (pci-s) (06/15/2014); Cardiac catheterization (N/A, 02/26/2015); and Cardiac catheterization (N/A, 02/26/2015).   Her Family history is unknown by patient.She reports that she has been smoking Cigarettes.  She has a 8.75 pack-year smoking history. She has never used smokeless tobacco. She reports that she drinks alcohol. She reports that she  uses illicit drugs (Marijuana).  Outpatient Prescriptions Prior to Visit  Medication Sig Dispense Refill  . amLODipine (NORVASC) 5 MG tablet Take 1 tablet (5 mg total) by mouth daily. 30 tablet 2  . aspirin EC 81 MG EC tablet Take 1 tablet (81 mg total) by mouth daily. 30 tablet 2  . atorvastatin (LIPITOR) 40 MG tablet Take 1 tablet (40 mg total) by mouth daily at 6 PM. 30 tablet 2  . carisoprodol (SOMA) 350 MG tablet Take 1 tablet (350 mg total) by mouth 3 (three) times daily as needed (muscle spasms). 30 tablet 0  . clopidogrel (PLAVIX) 75 MG tablet Take 1 tablet (75 mg total) by mouth daily with breakfast. 30 tablet 2  . COMBIVENT RESPIMAT 20-100 MCG/ACT AERS respimat Inhale 1 puff into the lungs every 6 (six) hours as needed for wheezing or shortness of breath. 1 Inhaler 2  . gabapentin (NEURONTIN) 300 MG capsule Take 3 capsules (900 mg total) by mouth 3 (three) times daily. 30 capsule 0  . HYDROmorphone (DILAUDID) 2 MG tablet Take 1 tablet (2 mg total) by mouth every 12 (twelve) hours as needed for severe pain. 10 tablet 0  . lisinopril (PRINIVIL,ZESTRIL) 40 MG tablet Take 1 tablet (40 mg total) by mouth daily. 30 tablet 2  . metFORMIN (GLUCOPHAGE) 500 MG tablet Take 1 tablet (500 mg total) by mouth 2 (two) times daily with a meal. 60 tablet 2  . metoprolol succinate (TOPROL-XL) 200 MG 24 hr tablet Take 1 tablet (200 mg total) by mouth daily. Take with or immediately following a meal. 30 tablet 02  . Multiple Vitamins-Minerals (MULTIVITAMIN WITH MINERALS) tablet Take 1 tablet by mouth daily.    . mupirocin ointment (BACTROBAN) 2 % Place 1 application into the nose 2 (two) times daily. 22 g 0  .  oxyCODONE (OXY IR/ROXICODONE) 5 MG immediate release tablet Take 1 tablet (5 mg total) by mouth every 6 (six) hours as needed for moderate pain. 15 tablet 0  . VENTOLIN HFA 108 (90 BASE) MCG/ACT inhaler Inhale 1-2 puffs into the lungs every 6 (six) hours as needed for wheezing or shortness of breath.      . blood glucose meter kit and supplies KIT Dispense based on patient and insurance preference. Use up to three times daily blood sugar checks as directed. (FOR ICD-9 250.00, 250.01). 1 each 0   No facility-administered medications prior to visit.    ROS Review of Systems  Constitutional: Negative for fever, chills, diaphoresis and fatigue.  Respiratory: Negative for chest tightness, shortness of breath and wheezing.   Cardiovascular: Negative for chest pain, palpitations and leg swelling.  Gastrointestinal: Negative for nausea, vomiting and diarrhea.  Musculoskeletal: Positive for myalgias.  Skin: Positive for color change.       Ecchymosis from insertion of catheter  Neurological: Negative for dizziness, weakness, numbness and headaches.    Objective:  BP 122/74 mmHg  Pulse 61  Temp(Src) 97.9 F (36.6 C) (Oral)  Wt 190 lb 6.4 oz (86.365 kg)  SpO2 96%  Physical Exam  Constitutional: She is oriented to person, place, and time. She appears well-developed and well-nourished. No distress.  HENT:  Head: Normocephalic and atraumatic.  Right Ear: External ear normal.  Left Ear: External ear normal.  Eyes: Right eye exhibits no discharge. Left eye exhibits no discharge. No scleral icterus.  Cardiovascular: Normal rate and regular rhythm.   Murmur heard. Pulmonary/Chest: Effort normal and breath sounds normal. No respiratory distress. She has no wheezes. She has no rales. She exhibits no tenderness.  Neurological: She is alert and oriented to person, place, and time. No cranial nerve deficit. She exhibits normal muscle tone. Coordination normal.  Skin: Skin is warm and dry. No rash noted. She is not diaphoretic.  Psychiatric: She has a normal mood and affect. Her behavior is normal. Judgment and thought content normal.   Assessment & Plan:   Jennifer [name] was seen today for follow-up.  Diagnoses and all orders for this visit:  Tobacco abuse  Poorly controlled type 2 diabetes mellitus  with complication [name])  Hospital discharge follow-up  Other orders -     nicotine (NICODERM CQ - DOSED IN MG/24 HOURS) 21 mg/24hr patch; Place 1 patch (21 mg total) onto the skin daily.  I have discontinued Ms. Fitzsimons's blood glucose meter kit and supplies. I am also having her start on nicotine. Additionally, I am having her maintain her mupirocin ointment, multivitamin with minerals, VENTOLIN HFA, aspirin, amLODipine, atorvastatin, carisoprodol, clopidogrel, COMBIVENT RESPIMAT, gabapentin, lisinopril, oxyCODONE, metoprolol, HYDROmorphone, and metFORMIN.  Meds ordered this encounter  Medications  . nicotine (NICODERM CQ - DOSED IN MG/24 HOURS) 21 mg/24hr patch    Sig: Place 1 patch (21 mg total) onto the skin daily.    Dispense:  28 patch    Refill:  0    Order Specific Question:  Supervising Provider    Answer:  Crecencio Mc [[id number]]     Follow-up: Return if symptoms worsen or fail to improve.

## 2015-03-02 ENCOUNTER — Other Ambulatory Visit: Payer: Self-pay | Admitting: Nurse Practitioner

## 2015-03-02 ENCOUNTER — Telehealth: Payer: Self-pay

## 2015-03-02 MED ORDER — BLOOD GLUCOSE MONITORING SUPPL W/DEVICE KIT: PACK | Status: AC

## 2015-03-02 NOTE — Telephone Encounter (Signed)
Sent to pharmacy 

## 2015-03-02 NOTE — Telephone Encounter (Signed)
Please needs a Diabetic meter and strips/ lancets called into the [facility] [address] [place]. I did not see this order in the chart. She was given a rx at the hospital but they need it to come from her primary care doctor.

## 2015-03-05 ENCOUNTER — Telehealth: Payer: Self-pay | Admitting: *Deleted

## 2015-03-05 NOTE — Telephone Encounter (Signed)
Pt calling stating she had a procedure last Monday by Dr Tarri AbernethyArida She's noticing some new bruises on her wrist where they went in Also states that yesterday had some chest pains.  Almost went to Hospital but stated she waited it out Would like to know what to do. States none of this is normal for her. Please advise. Denies chest pains, or SOB

## 2015-03-05 NOTE — Telephone Encounter (Signed)
S/w pt who states she had 3 episodes of chest pain, radiating to her neck and arm, yesterday. Each episode lasted approximately one minute, resolved on its own.  She indicates she was resting when this happened. She is concerned because she states this is how she felt prior to her admission into the hospital and the cardiac cath on 11/7 Denies any pain today. Also states there is more bruising on her wrist at the incision site.  Advised pt to elevate and ice her wrist and avoid lifting. Advised pt to go directly to the ER if she experiences chest pain, radiating down arm, sweating, diaphoresis and that something is just not right. Pt verbalized understanding with no further questions.

## 2015-03-07 ENCOUNTER — Encounter: Payer: Self-pay | Admitting: Nurse Practitioner

## 2015-03-07 DIAGNOSIS — Z09 Encounter for follow-up examination after completed treatment for conditions other than malignant neoplasm: Secondary | ICD-10-CM | POA: Insufficient documentation

## 2015-03-07 NOTE — Assessment & Plan Note (Signed)
She is following discharge instructions. She understands how to take new medications. She is working on smoking cessation. We'll get her set up to test her blood sugars with new supplies. She declines any other help today. Patient has a new patient visit on 03/13/2015 we'll follow-up.

## 2015-03-07 NOTE — Assessment & Plan Note (Signed)
Patient reports there is a form to fill out an order for her to get a meter and diabetic testing supplies. We will wait on this

## 2015-03-07 NOTE — Assessment & Plan Note (Signed)
Pt would like assistance with Nicoderm patches. These were sent to her pharmacy patient would like to quit smoking by December 23 at the latest. Congratulated her on this goal and encouraged her to keep this.

## 2015-03-08 ENCOUNTER — Other Ambulatory Visit: Payer: Self-pay

## 2015-03-08 NOTE — Telephone Encounter (Signed)
Spoke with patient concerning the correct doses of Metformin she thought she was suppose to take 5mg  and not 500mg . The correct dose is 500 mg patient verbalize understanding.

## 2015-03-13 ENCOUNTER — Ambulatory Visit: Payer: Medicare Other | Admitting: Nurse Practitioner

## 2015-03-13 ENCOUNTER — Encounter: Payer: Self-pay | Admitting: Physician Assistant

## 2015-03-13 DIAGNOSIS — Z0289 Encounter for other administrative examinations: Secondary | ICD-10-CM

## 2015-03-14 ENCOUNTER — Ambulatory Visit (INDEPENDENT_AMBULATORY_CARE_PROVIDER_SITE_OTHER): Payer: Medicare Other | Admitting: Physician Assistant

## 2015-03-14 ENCOUNTER — Encounter: Payer: Self-pay | Admitting: Physician Assistant

## 2015-03-14 VITALS — BP 110/76 | HR 72 | Ht 62.0 in | Wt 189.0 lb

## 2015-03-14 DIAGNOSIS — E1165 Type 2 diabetes mellitus with hyperglycemia: Secondary | ICD-10-CM

## 2015-03-14 DIAGNOSIS — E118 Type 2 diabetes mellitus with unspecified complications: Secondary | ICD-10-CM

## 2015-03-14 DIAGNOSIS — R079 Chest pain, unspecified: Secondary | ICD-10-CM

## 2015-03-14 DIAGNOSIS — I25119 Atherosclerotic heart disease of native coronary artery with unspecified angina pectoris: Secondary | ICD-10-CM | POA: Diagnosis not present

## 2015-03-14 DIAGNOSIS — Z720 Tobacco use: Secondary | ICD-10-CM

## 2015-03-14 DIAGNOSIS — I10 Essential (primary) hypertension: Secondary | ICD-10-CM

## 2015-03-14 DIAGNOSIS — E785 Hyperlipidemia, unspecified: Secondary | ICD-10-CM

## 2015-03-14 DIAGNOSIS — I200 Unstable angina: Secondary | ICD-10-CM

## 2015-03-14 DIAGNOSIS — I2510 Atherosclerotic heart disease of native coronary artery without angina pectoris: Secondary | ICD-10-CM | POA: Insufficient documentation

## 2015-03-14 MED ORDER — AMLODIPINE BESYLATE 2.5 MG PO TABS: 2.5000 mg | ORAL_TABLET | Freq: Every day | ORAL | Status: DC

## 2015-03-14 NOTE — Patient Instructions (Addendum)
Medication Instructions:  Your physician has recommended you make the following change in your medication:  DECREASE Norvasc to 2.5mg  once per day    Labwork: none  Testing/Procedures: Your physician has requested that you have a lexiscan myoview. For further information please visit [email or web address]. Please follow instruction sheet, as given.  [facility] MYOVIEW  Your caregiver has ordered a Stress Test with nuclear imaging. The purpose of this test is to evaluate the blood supply to your heart muscle. This procedure is referred to as a "Non-Invasive Stress Test." This is because other than having an IV started in your vein, nothing is inserted or "invades" your body. Cardiac stress tests are done to find areas of poor blood flow to the heart by determining the extent of coronary artery disease (CAD). Some patients exercise on a treadmill, which naturally increases the blood flow to your heart, while others who are  unable to walk on a treadmill due to physical limitations have a pharmacologic/chemical stress agent called Lexiscan . This medicine will mimic walking on a treadmill by temporarily increasing your coronary blood flow.   Please note: these test may take anywhere between 2-4 hours to complete  PLEASE REPORT TO [facility]TR[facility]  THE VOLUNTEERS AT THE FIRST DESK WILL DIRECT YOU WHERE TO GO  Date of Procedure:______Friday, November 25_______________________________  Arrival Time for Procedure:__________8:15am____________________  Instructions regarding medication:   __xx__ : Hold diabetes medication morning of procedure. Do not take metformin 24 hours before and 48 hours after procedure.  __xx__:  Hold metoprolol night before procedure and morning of procedure    PLEASE NOTIFY THE OFFICE AT LEAST 24 HOURS IN ADVANCE IF YOU ARE UNABLE TO KEEP YOUR APPOINTMENT.  [phone] AND  PLEASE NOTIFY [facility] AT [facility] AT LEAST 24 HOURS IN ADVANCE IF YOU ARE UNABLE TO  KEEP YOUR APPOINTMENT. [phone]  How to prepare for your Myoview test:   Do not eat or drink after midnight  No caffeine for 24 hours prior to test  No smoking 24 hours prior to test.  Your medication may be taken with water.  If your doctor stopped a medication because of this test, do not take that medication.  Ladies, please do not wear dresses.  Skirts or pants are appropriate. Please wear a short sleeve shirt.  No perfume, cologne or lotion.  Wear comfortable walking shoes. No heels!            Follow-Up: Your physician recommends that you schedule a follow-up appointment after lexi myoview with Eula Listenyan Dunn    Any Other Special Instructions Will Be Listed Below (If Applicable).     If you need a refill on your cardiac medications before your next appointment, please call your pharmacy.  Cardiac Nuclear Scanning A cardiac nuclear scan is used to check your heart for problems, such as the following:  A portion of the heart is not getting enough blood.  Part of the heart muscle has died, which happens with a heart attack.  The heart wall is not working normally.  In this test, a radioactive dye (tracer) is injected into your bloodstream. After the tracer has traveled to your heart, a scanning device is used to measure how much of the tracer is absorbed by or distributed to various areas of your heart. LET [facility] PROVIDER KNOW ABOUT:  Any allergies you have.  All medicines you are taking, including vitamins, herbs, eye drops, creams, and over-the-counter medicines.  Previous problems you or members of  your family have had with the use of anesthetics.  Any blood disorders you have.  Previous surgeries you have had.  Medical conditions you have.  RISKS AND COMPLICATIONS Generally, this is a safe procedure. However, as with any procedure, problems can occur. Possible problems include:   Serious chest pain.  Rapid heartbeat.  Sensation of  warmth in your chest. This usually passes quickly. BEFORE THE PROCEDURE Ask your health care provider about changing or stopping your regular medicines. PROCEDURE This procedure is usually done at a hospital and takes 2-4 hours.  An IV tube is inserted into one of your veins.  Your health care provider will inject a small amount of radioactive tracer through the tube.  You will then wait for 20-40 minutes while the tracer travels through your bloodstream.  You will lie down on an exam table so images of your heart can be taken. Images will be taken for about 15-20 minutes.  You will exercise on a treadmill or stationary bike. While you exercise, your heart activity will be monitored with an electrocardiogram (ECG), and your blood pressure will be checked.  If you are unable to exercise, you may be given a medicine to make your heart beat faster.  When blood flow to your heart has peaked, tracer will again be injected through the IV tube.  After 20-40 minutes, you will get back on the exam table and have more images taken of your heart.  When the procedure is over, your IV tube will be removed. AFTER THE PROCEDURE  You will likely be able to leave shortly after the test. Unless your health care provider tells you otherwise, you may return to your normal schedule, including diet, activities, and medicines.  Make sure you find out how and when you will get your test results.   This information is not intended to replace advice given to you by your health care provider. Make sure you discuss any questions you have with your health care provider.   Document Released: 05/02/2004 Document Revised: 04/12/2013 Document Reviewed: 03/16/2013 Elsevier Interactive Patient Education [employer]

## 2015-03-14 NOTE — Progress Notes (Signed)
Cardiology Office Note:  Date of Encounter: 03/14/2015  ID: Jennifer [name], DOB [birth year], MRN [id number]  PCP:  Carollee Leitz, NP Primary Cardiologist:  Dr. Mariah Milling, MD  Chief Complaint  Patient presents with  . other    F/u cardiac cath. c/o chest pain. Meds reviewed verbally with pt.    HPI:  54 year old female with history of CAD s/p multiple prior stents described below most recently 02/26/2015 to the distal RCA for in-stent restenosis, DM2, HTN, HLD, sleep apnea not on CPAP, ongoing tobacco abuse soming 1/4 pack of cigarettes daily, anxiety, and depression who presents for cardiac cath follow up with complaints of chest pain.   Patient previously underwent stenting to the RCA in 2008 followed by recurrence of chest pain in 2010 leading abnormal stress test that showed prior inferior infarct with peri-infarct ischemia. Cardiac cath showed 95% stenosis in the mid RCA, diffuse 60% stenosis in the proximal RDPA. She underwent successful PCI/DES of the mid RCA without complications. She redeevloped chest pain in 05/2014 and was transferred from [facility] to [facility]. Troponin was negative. Cardiac cath showed widely patent but small left main, mild disease in the LAD and its branches, mild to moderate disease in the LCx and its branches, severe, 90% stenosis in the distal RCA s/p PCI/DES 2.5 x 20 Synergy drug eluting stent, normal LVSF, LVEDP 28 mm Hg. She was admitted again to the hospital on 11/4 with recurrence of chest pain with associated palpitations. Troponin was negative. ECG showed no acute changes. She underwent nuclear stress test on 11/4 that showed medium defect of moderate severity present int eh mid inferoseptal, mid inferior and apical inferior location. This was consistent with ischemia. EF 54%. This was in intermediate risk study. She underwent cardiac cath on 11/7 that showed severe one-vessel CAD with severe ISR in the proximal segment of the previously placed stents in the distal  RCA. She underwent successful PCI/DES to the distal RCA for ISR. Mildly elevated LVEDP. Normal EF by nuclear stress testing. Remaining cath details below. It was recommended she continue DAPT for at least 1 year and cotninue aggressive treatment of risk factors. She called the office on 11/14 with comoplaints of intermittent, nonexertional chest pain lasting 1 minute in duration that would self resolve. She did not seek care at that time.   She has continued to have intermittent, nonexertional chest pain since her PCP. Symptoms will last 5-10 seconds and self resolve. No radiation. Some associated SOB and cough, though this is not new for her given her smoking history. She has not missed any doses of DAPT (aspirin and Plavix). She has no exertional symptoms and can go all day running around without any symptoms. She also wants to decrease her amlodipine as she feels like a systolic BP in the 1-teens is too low for her. At times upon standing she feels lightheaded. Not currently.     Past Medical History  Diagnosis Date  . Hypertension   . High cholesterol   . Heart murmur   . Myocardial infarction (HCC) 2008; ~ 2012  . Chronic bronchitis (HCC)     "get it q yr"  . Pneumonia     "get it 1-2 times/year" (06/14/2014)  . Sleep apnea     "they want me to do the lab but I haven't" (06/14/2014)  . RLS (restless legs syndrome)   . Type II diabetes mellitus (HCC)   . GERD (gastroesophageal reflux disease)   . History of stomach  ulcers   . Daily headache     "when my blood pressure is up" (06/14/2014)  . Migraine     "haven't had them in a good while; did get them 1-2 times/yr" (06/14/2014)  . Seizures ([place])   . Stroke [facility])     "they say I've had several" (06/14/2014)  . Arthritis     "qwhere" (06/14/2014)  . Anxiety   . Depression   . Bipolar disorder ([place])   . Kidney stones   . Urinary, incontinence, stress female   . Insomnia   . CAD (coronary artery disease)     a. cardiac cath: 2010: 95%  mRCA s/p PCI/DES, 60% RPDA; b. cath 05/2014, widely patent LM, mild to mod dz in LAD and LCx, 90% dRCA s/p PCI/DES; c. cath 02/2015: severe ISR proximal segement of distal RCA stent s/p PCI/DES, nl EF by nuc 11/16, mildly elevated LVEDP   . Kidney stones   :  Past Surgical History  Procedure Laterality Date  . Tonsillectomy    . Knee arthroscopy Left   . Extracorporeal shock wave lithotripsy  X 2  . Dilation and curettage of uterus    . Cesarean section  1984  . Tubal ligation    . Abdominal hysterectomy  1984    "I've got 1 ovary left"  . Coronary angioplasty with stent placement  2008; ~ 2012    "1; 1"  . Left heart catheterization with coronary angiogram N/A 06/15/2014    Procedure: LEFT HEART CATHETERIZATION WITH CORONARY ANGIOGRAM;  Surgeon: Jettie Booze, MD;  Location: [facility] CATH LAB;  Service: Cardiovascular;  Laterality: N/A;  . Percutaneous coronary stent intervention (pci-s)  06/15/2014    Procedure: PERCUTANEOUS CORONARY STENT INTERVENTION (PCI-S);  Surgeon: Jettie Booze, MD;  Location: [facility] CATH LAB;  Service: Cardiovascular;;  . Cardiac catheterization N/A 02/26/2015    Procedure: Left Heart Cath and Coronary Angiography;  Surgeon: Wellington Hampshire, MD;  Location: [place] CV LAB;  Service: Cardiovascular;  Laterality: N/A;  . Cardiac catheterization N/A 02/26/2015    Procedure: Coronary Stent Intervention;  Surgeon: Wellington Hampshire, MD;  Location: [place] CV LAB;  Service: Cardiovascular;  Laterality: N/A;  :  Social History:  The patient  reports that she has been smoking Cigarettes.  She has a 8.75 pack-year smoking history. She has never used smokeless tobacco. She reports that she uses illicit drugs (Marijuana). She reports that she does not drink alcohol.   Family History  Problem Relation Age of Onset  . Family history unknown: Yes     Allergies:  Allergies  Allergen Reactions  . Lithium Other (See Comments)    Blood dyscrasia  . Tegretol  [Carbamazepine] Other (See Comments)    Blood dyscrasia when on with Lithium  . Levaquin [Levofloxacin In D5w] Other (See Comments)    "Salty" sensation in mouth. "Hard time to explain it"  . Lodine [Etodolac] Other (See Comments)    Rash, N/V     Home Medications:  Current Outpatient Prescriptions  Medication Sig Dispense Refill  . amLODipine (NORVASC) 5 MG tablet Take 1 tablet (5 mg total) by mouth daily. 30 tablet 2  . aspirin EC 81 MG EC tablet Take 1 tablet (81 mg total) by mouth daily. 30 tablet 2  . atorvastatin (LIPITOR) 40 MG tablet Take 1 tablet (40 mg total) by mouth daily at 6 PM. 30 tablet 2  . Blood Glucose Monitoring Suppl W/DEVICE KIT Test 1-2 times daily; E11.65 diagnosis 1  each 0  . carisoprodol (SOMA) 350 MG tablet Take 1 tablet (350 mg total) by mouth 3 (three) times daily as needed (muscle spasms). 30 tablet 0  . clopidogrel (PLAVIX) 75 MG tablet Take 1 tablet (75 mg total) by mouth daily with breakfast. 30 tablet 2  . COMBIVENT RESPIMAT 20-100 MCG/ACT AERS respimat Inhale 1 puff into the lungs every 6 (six) hours as needed for wheezing or shortness of breath. 1 Inhaler 2  . gabapentin (NEURONTIN) 300 MG capsule Take 3 capsules (900 mg total) by mouth 3 (three) times daily. 30 capsule 0  . lisinopril (PRINIVIL,ZESTRIL) 40 MG tablet Take 1 tablet (40 mg total) by mouth daily. 30 tablet 2  . metFORMIN (GLUCOPHAGE) 500 MG tablet Take 1 tablet (500 mg total) by mouth 2 (two) times daily with a meal. 60 tablet 2  . metoprolol succinate (TOPROL-XL) 200 MG 24 hr tablet Take 1 tablet (200 mg total) by mouth daily. Take with or immediately following a meal. 30 tablet 02  . Multiple Vitamins-Minerals (MULTIVITAMIN WITH MINERALS) tablet Take 1 tablet by mouth daily.    . nicotine (NICODERM CQ - DOSED IN MG/24 HOURS) 21 mg/24hr patch Place 1 patch (21 mg total) onto the skin daily. 28 patch 0  . oxyCODONE (OXY IR/ROXICODONE) 5 MG immediate release tablet Take 1 tablet (5 mg total)  by mouth every 6 (six) hours as needed for moderate pain. 15 tablet 0  . VENTOLIN HFA 108 (90 BASE) MCG/ACT inhaler Inhale 1-2 puffs into the lungs every 6 (six) hours as needed for wheezing or shortness of breath.      No current facility-administered medications for this visit.     Review of Systems:  Review of Systems  Constitutional: Positive for malaise/fatigue. Negative for fever, chills, weight loss and diaphoresis.  HENT: Negative for congestion.   Eyes: Negative for discharge and redness.  Respiratory: Positive for cough and shortness of breath. Negative for hemoptysis, sputum production and wheezing.   Cardiovascular: Positive for chest pain. Negative for palpitations, orthopnea, claudication, leg swelling and PND.  Gastrointestinal: Negative for heartburn, nausea, vomiting and abdominal pain.  Musculoskeletal: Negative for falls.  Skin: Negative for rash.  Neurological: Negative for dizziness, tingling, tremors, sensory change, speech change, focal weakness, loss of consciousness and weakness.  Endo/Heme/Allergies: Does not bruise/bleed easily.  Psychiatric/Behavioral: Positive for substance abuse. The patient is nervous/anxious.        Ongoing tobacco abuse  All other systems reviewed and are negative.    Physical Exam:  Blood pressure 110/76, pulse 72, height $RemoveBe'5\' 2"'[id number]$  (1.575 m), weight 189 lb (85.73 kg). BMI: Body mass index is 34.56 kg/(m^2). General: Pleasant, NAD. Smells of tobacco.  Psych: Normal affect. Responds to questions with normal affect.  Neuro: Alert and oriented X 3. Moves all extremities spontaneously. HEENT: Normocephalic, atraumatic. EOM intact. Sclera anicteric.  Neck: Trachea midline. Supple without bruits or JVD. Lungs:  Respirations regular and unlabored. CTA bilaterally without wheezing, crackles, or rhonchi.  Heart: RRR, normal s3, s4. No murmurs, rubs, or gallops.  Abdomen: Obese, soft, non-tender, non-distended, BS + x 4.  Extremities: No  clubbing, cyanosis or edema. DP/PT/Radials 2+ and equal bilaterally.   Accessory Clinical Findings:  EKG: NSR, 72 bpm, low voltage precordial leads, no significant st/t changes   Recent Labs: 02/23/2015: ALT 20; TSH 0.266* 02/27/2015: BUN 14; Creatinine, Ser 0.58; Hemoglobin 11.8*; Platelets 198; Potassium 4.2; Sodium 143  No results found for requested labs within last 365 days.  Estimated Creatinine Clearance:  84.4 mL/min (by C-G formula based on Cr of 0.58).  Weights: Wt Readings from Last 3 Encounters:  03/14/15 189 lb (85.73 kg)  03/01/15 190 lb 6.4 oz (86.365 kg)  02/27/15 198 lb (89.812 kg)    Other studies Reviewed: Additional studies/ records that were reviewed today include: prior clinic notes.  Assessment & Plan:  1. Chest pain with moderate risk for cardiac etiology: -Schedule for treadmill Myoview to evaluate for high risk ischemia  -Possible anxiety component, per patient  -If normal Myoview recommend patient to follow up with PCP for anxiety issues. Should her symptoms persist in the setting of a normal Myoview would then pursue cardiac catheterization  -She has not missed any doses of her DAPT (aspirin and Plavix)  2. CAD s/p multiple PCI as above: -Continue DAPT for at least 12 months from the date of her last cardiac cath, 02/26/2015, she will need to continue at least until 02/2016 -Continue Toprol XL 200 mg daily  3. HTN: -She reports a systolic BP of 594 is too low for her and really requests that her amlodipine be either decreased or discontinued -Decrease amlodipine to 2.5 mg daily -Continue lisinopril 40 mg and Toprol XL 200 mg daily   4. HLD: -Continue Lipitor 40 mg daily  5. Ongoing tobacco abuse: -She is now wearing nicotine patches and working on quitting -She has smoked 2 cigarettes today -Continue to work on quitting  6. DM2: -Per PCP  7. Anxiety: -Per PCP  8. Chronic pain: -Per PCP   Dispo: -Follow up 1 month  Current medicines  are reviewed at length with the patient today.  The patient did not have any concerns regarding medicines.   Christell Faith, PA-C [facility] at [place], [place] [address] [phone] [facility] 03/14/2015, 2:46 PM

## 2015-03-16 ENCOUNTER — Encounter
Admission: RE | Admit: 2015-03-16 | Discharge: 2015-03-16 | Disposition: A | Discharge status: Home / Self Care | Payer: Medicare Other | Source: Ambulatory Visit | Attending: Physician Assistant | Admitting: Physician Assistant

## 2015-03-16 DIAGNOSIS — R079 Chest pain, unspecified: Secondary | ICD-10-CM | POA: Insufficient documentation

## 2015-03-19 ENCOUNTER — Ambulatory Visit: Payer: Medicare Other | Admitting: Cardiovascular Disease

## 2015-03-19 ENCOUNTER — Ambulatory Visit
Admission: RE | Admit: 2015-03-19 | Discharge: 2015-03-19 | Disposition: A | Discharge status: Home / Self Care | Payer: Medicare Other | Source: Ambulatory Visit | Attending: Physician Assistant | Admitting: Physician Assistant

## 2015-03-19 DIAGNOSIS — R079 Chest pain, unspecified: Secondary | ICD-10-CM | POA: Diagnosis not present

## 2015-03-19 LAB — NM MYOCAR MULTI W/SPECT W/WALL MOTION / EF
CHL CUP NUCLEAR SRS: 3
CHL CUP NUCLEAR SSS: 1
CHL CUP STRESS STAGE 1 HR: 82 {beats}/min
CHL CUP STRESS STAGE 2 GRADE: 0 %
CHL CUP STRESS STAGE 2 HR: 83 {beats}/min
CHL CUP STRESS STAGE 2 SPEED: 0 mph
CHL CUP STRESS STAGE 4 GRADE: 0 %
CHL CUP STRESS STAGE 4 HR: 83 {beats}/min
CHL CUP STRESS STAGE 4 SPEED: 1 mph
CHL CUP STRESS STAGE 5 GRADE: 0 %
CHL CUP STRESS STAGE 5 HR: 83 {beats}/min
CHL CUP STRESS STAGE 6 DBP: 102 mmHg
CHL CUP STRESS STAGE 7 SPEED: 2.5 mph
CHL CUP STRESS STAGE 8 GRADE: 0 %
CHL CUP STRESS STAGE 8 SPEED: 0 mph
CHL CUP STRESS STAGE 9 DBP: 83 mmHg
CHL CUP STRESS STAGE 9 HR: 99 {beats}/min
CSEPPMHR: 91 %
Estimated workload: 6.4 METS
Exercise duration (min): 4 min
LV dias vol: 68 mL
LVSYSVOL: 18 mL
Peak HR: 155 {beats}/min
Percent HR: 92 %
Rest HR: 86 {beats}/min
SDS: 1
Stage 3 Grade: 0 %
Stage 3 HR: 84 {beats}/min
Stage 3 Speed: 0 mph
Stage 5 Speed: 1 mph
Stage 6 Grade: 10 %
Stage 6 HR: 136 {beats}/min
Stage 6 SBP: 155 mmHg
Stage 6 Speed: 1.7 mph
Stage 7 Grade: 12 %
Stage 7 HR: 155 {beats}/min
Stage 8 HR: 139 {beats}/min
Stage 9 Grade: 0 %
Stage 9 SBP: 172 mmHg
Stage 9 Speed: 0 mph
TID: 0.93

## 2015-03-19 MED ORDER — TECHNETIUM TC 99M SESTAMIBI - CARDIOLITE
31.9000 | Freq: Once | INTRAVENOUS | Status: AC | PRN
  Administered 2015-03-19: 31.9 via INTRAVENOUS

## 2015-03-19 MED ORDER — TECHNETIUM TC 99M SESTAMIBI - CARDIOLITE
12.6800 | Freq: Once | INTRAVENOUS | Status: AC | PRN
  Administered 2015-03-19: 12.68 via INTRAVENOUS

## 2015-04-11 ENCOUNTER — Ambulatory Visit: Payer: Medicare Other | Admitting: Cardiovascular Disease

## 2015-04-11 ENCOUNTER — Encounter: Payer: Self-pay | Admitting: *Deleted

## 2015-04-11 DIAGNOSIS — R0989 Other specified symptoms and signs involving the circulatory and respiratory systems: Secondary | ICD-10-CM

## 2015-04-19 ENCOUNTER — Ambulatory Visit: Payer: Medicare Other | Admitting: Physician Assistant

## 2015-05-25 ENCOUNTER — Emergency Department
Admission: EM | Admit: 2015-05-25 | Discharge: 2015-05-25 | Disposition: A | Discharge status: Home / Self Care | Payer: Medicare Other | Attending: Emergency Medicine | Admitting: Emergency Medicine

## 2015-05-25 ENCOUNTER — Encounter: Payer: Self-pay | Admitting: Emergency Medicine

## 2015-05-25 ENCOUNTER — Emergency Department: Payer: Medicare Other

## 2015-05-25 DIAGNOSIS — F17210 Nicotine dependence, cigarettes, uncomplicated: Secondary | ICD-10-CM | POA: Diagnosis not present

## 2015-05-25 DIAGNOSIS — I10 Essential (primary) hypertension: Secondary | ICD-10-CM | POA: Insufficient documentation

## 2015-05-25 DIAGNOSIS — R109 Unspecified abdominal pain: Secondary | ICD-10-CM | POA: Diagnosis not present

## 2015-05-25 DIAGNOSIS — E119 Type 2 diabetes mellitus without complications: Secondary | ICD-10-CM | POA: Diagnosis not present

## 2015-05-25 DIAGNOSIS — G8929 Other chronic pain: Secondary | ICD-10-CM | POA: Diagnosis not present

## 2015-05-25 DIAGNOSIS — M545 Low back pain: Secondary | ICD-10-CM | POA: Diagnosis present

## 2015-05-25 LAB — URINALYSIS COMPLETE WITH MICROSCOPIC (ARMC ONLY)
BACTERIA UA: NONE SEEN
Bilirubin Urine: NEGATIVE
Glucose, UA: >500 mg/dL — AB
Ketones, ur: NEGATIVE mg/dL
Leukocytes, UA: NEGATIVE
Nitrite: NEGATIVE
PH: 5 (ref 5.0–8.0)
Protein, ur: NEGATIVE mg/dL
Specific Gravity, Urine: 1.024 (ref 1.005–1.030)

## 2015-05-25 LAB — GLUCOSE, CAPILLARY: Glucose-Capillary: 321 mg/dL — ABNORMAL HIGH (ref 65–99)

## 2015-05-25 MED ORDER — HYDROMORPHONE HCL 1 MG/ML IJ SOLN
2.0000 mg | Freq: Once | INTRAMUSCULAR | Status: AC
  Administered 2015-05-25: 2 mg via INTRAMUSCULAR
  Filled 2015-05-25: qty 2

## 2015-05-25 MED ORDER — METAXALONE 800 MG PO TABS: 800.0000 mg | ORAL_TABLET | Freq: Three times a day (TID) | ORAL | Status: DC

## 2015-05-25 NOTE — ED Notes (Signed)
Patient transported to XR. 

## 2015-05-25 NOTE — ED Provider Notes (Signed)
[facility] Emergency Department Provider Note     Time seen: ----------------------------------------- 9:31 PM on 05/25/2015 -----------------------------------------    I have reviewed the triage vital signs and the nursing notes.   HISTORY  Chief Complaint Flank Pain    HPI Jennifer [name] is a 55 y.o. female who presents to ER stating she developed lower back pain since Sunday. Patient states she has history of kidney stones and this feels like same. States it starts in the lower back and radiates to the right side. She denies fevers chills or other complaints. She does have chronic pain, takes Percocet [name]. Percocet plus Motrin has not been alleviating her symptoms.   Past Medical History  Diagnosis Date  . Hypertension   . High cholesterol   . Heart murmur   . Myocardial infarction (HCC) 2008; ~ 2012  . Chronic bronchitis (HCC)     "get it q yr"  . Pneumonia     "get it 1-2 times/year" (06/14/2014)  . Sleep apnea     "they want me to do the lab but I haven't" (06/14/2014)  . RLS (restless legs syndrome)   . Type II diabetes mellitus (HCC)   . GERD (gastroesophageal reflux disease)   . History of stomach ulcers   . [name] headache     "when my blood pressure is up" (06/14/2014)  . Migraine     "haven't had them in a good while; did get them 1-2 times/yr" (06/14/2014)  . Seizures (HCC)   . Stroke [facility])     "they say I've had several" (06/14/2014)  . Arthritis     "qwhere" (06/14/2014)  . Anxiety   . Depression   . Bipolar disorder (HCC)   . Kidney stones   . Urinary, incontinence, stress female   . Insomnia   . CAD (coronary artery disease)     a. cardiac cath: 2010: 95% mRCA s/p PCI/DES, 60% RPDA; b. cath 05/2014, widely patent LM, mild to mod dz in LAD and LCx, 90% dRCA s/p PCI/DES; c. cath 02/2015: severe ISR proximal segement of distal RCA stent s/p PCI/DES, nl EF by nuc 11/16, mildly elevated LVEDP   . Kidney stones     Patient  Active Problem List   Diagnosis Date Noted  . Chest pain with moderate risk for cardiac etiology 03/14/2015  . CAD (coronary artery disease)   . Hospital discharge follow-up 03/07/2015  . Poorly controlled type 2 diabetes mellitus with complication (HCC) 01/16/2015  . Hyperlipidemia 06/22/2014  . Stented coronary artery   . Chronic pain syndrome   . Unstable angina (HCC)   . Chest pain at rest 06/14/2014  . Diabetes (HCC) 06/14/2014  . Acute bronchitis 06/14/2014  . Tobacco abuse 06/14/2014  . Essential hypertension 06/14/2014  . GERD (gastroesophageal reflux disease) 06/14/2014  . Bipolar disorder (HCC) 06/14/2014  . Chest pain 06/14/2014    Past Surgical History  Procedure Laterality Date  . Tonsillectomy    . Knee arthroscopy Left   . Extracorporeal shock wave lithotripsy  X 2  . Dilation and curettage of uterus    . Cesarean section  1984  . Tubal ligation    . Abdominal hysterectomy  1984    "I've got 1 ovary left"  . Coronary angioplasty with stent placement  2008; ~ 2012    "1; 1"  . Left heart catheterization with coronary angiogram N/A 06/15/2014    Procedure: LEFT HEART CATHETERIZATION WITH CORONARY ANGIOGRAM;  Surgeon: Corky Crafts, MD;  Location: [facility] CATH LAB;  Service: Cardiovascular;  Laterality: N/A;  . Percutaneous coronary stent intervention (pci-s)  06/15/2014    Procedure: PERCUTANEOUS CORONARY STENT INTERVENTION (PCI-S);  Surgeon: Corky Crafts, MD;  Location: [facility] CATH LAB;  Service: Cardiovascular;;  . Cardiac catheterization N/A 02/26/2015    Procedure: Left Heart Cath and Coronary Angiography;  Surgeon: Iran Ouch, MD;  Location: [facility] INVASIVE CV LAB;  Service: Cardiovascular;  Laterality: N/A;  . Cardiac catheterization N/A 02/26/2015    Procedure: Coronary Stent Intervention;  Surgeon: Iran Ouch, MD;  Location: [facility] INVASIVE CV LAB;  Service: Cardiovascular;  Laterality: N/A;    Allergies Lithium; Tegretol; Levaquin; and  Lodine  Social History Social History  Substance Use Topics  . Smoking status: Current Every Day Smoker -- 0.50 packs/day for 35 years    Types: Cigarettes  . Smokeless tobacco: Never Used     Comment: Patches  . Alcohol Use: 0.0 oz/week    0 Standard drinks or equivalent per week     Comment: 06/14/2014 "might drink twice/yr"    Review of Systems Constitutional: Negative for fever. Eyes: Negative for visual changes. ENT: Negative for sore throat. Cardiovascular: Negative for chest pain. Respiratory: Negative for shortness of breath. Gastrointestinal: Positive for right flank pain Genitourinary: Negative for dysuria. Musculoskeletal: Negative for back pain. Skin: Negative for rash. Neurological: Negative for headaches, focal weakness or numbness.  10-point ROS otherwise negative.  ____________________________________________   PHYSICAL EXAM:  VITAL SIGNS: ED Triage Vitals  Enc Vitals Group     BP 05/25/15 2043 151/94 mmHg     Pulse Rate 05/25/15 2043 88     Resp 05/25/15 2043 18     Temp 05/25/15 2043 97.8 F (36.6 C)     Temp Source 05/25/15 2043 Oral     SpO2 05/25/15 2043 95 %     Weight --      Height --      Head Cir --      Peak Flow --      Pain Score 05/25/15 2047 8     Pain Loc --      Pain Edu? --      Excl. in [place]? --     Constitutional: Alert and oriented. Well appearing and in no distress. Eyes: Conjunctivae are normal. PERRL. Normal extraocular movements. ENT   Head: Normocephalic and atraumatic.   Nose: No congestion/rhinnorhea.   Mouth/Throat: Mucous membranes are moist.   Neck: No stridor. Cardiovascular: Normal rate, regular rhythm. Normal and symmetric distal pulses are present in all extremities. No murmurs, rubs, or gallops. Respiratory: Normal respiratory effort without tachypnea nor retractions. Breath sounds are clear and equal bilaterally. No wheezes/rales/rhonchi. Gastrointestinal: Soft and nontender. No distention. No  abdominal bruits.  Musculoskeletal: Nontender with normal range of motion in all extremities. No joint effusions.  No lower extremity tenderness nor edema. Neurologic:  Normal speech and language. No gross focal neurologic deficits are appreciated. Speech is normal. No gait instability. Skin:  Skin is warm, dry and intact. No rash noted. Psychiatric: Mood and affect are normal. Speech and behavior are normal. Patient exhibits appropriate insight and judgment. ____________________________________________  ED COURSE:  Pertinent labs & imaging results that were available during my care of the patient were reviewed by me and considered in my medical decision making (see chart for details). Patient is in no acute distress, will obtain basic imaging and urinalysis. She does not appear to be in any discomfort at this time ____________________________________________  LABS (pertinent positives/negatives)  Labs Reviewed  URINALYSIS COMPLETEWITH MICROSCOPIC ([facility] ONLY) - Abnormal; Notable for the following:    Color, Urine YELLOW (*)    APPearance HAZY (*)    Glucose, UA >500 (*)    Hgb urine dipstick 1+ (*)    Squamous Epithelial / LPF 6-30 (*)    All other components within normal limits  GLUCOSE, CAPILLARY - Abnormal; Notable for the following:    Glucose-Capillary 321 (*)    All other components within normal limits  CBG MONITORING, ED    RADIOLOGY Images were viewed by me  KUB  IMPRESSION: No evidence urolithiasis. Pelvic phleboliths are unchanged. ____________________________________________  FINAL ASSESSMENT AND PLAN  Flank pain  Plan: Patient with labs and imaging as dictated above. Patient is advised to get her blood sugar under better control. She is advised is not likely a kidney stone today. She is advised to take muscle relaxants and follow-up with her doctor for reevaluation.   Emily Filbert, MD   Emily Filbert, MD 05/25/15 [phone]

## 2015-05-25 NOTE — ED Notes (Signed)
MD Williams at bedside.  

## 2015-05-25 NOTE — ED Notes (Signed)
Patient state that she developed lower back pain Sunday. Patient states that it starts in the lower back and radiates to right side. Patient states that she has a history of kidney stones.

## 2015-05-25 NOTE — Discharge Instructions (Signed)
Flank Pain °Flank pain refers to pain that is located on the side of the body between the upper abdomen and the back. The pain may occur over a short period of time (acute) or may be long-term or reoccurring (chronic). It may be mild or severe. Flank pain can be caused by many things. °CAUSES  °Some of the more common causes of flank pain include: °· Muscle strains.   °· Muscle spasms.   °· A disease of your spine (vertebral disk disease).   °· A lung infection (pneumonia).   °· Fluid around your lungs (pulmonary edema).   °· A kidney infection.   °· Kidney stones.   °· A very painful skin rash caused by the chickenpox virus (shingles).   °· Gallbladder disease.   °HOME CARE INSTRUCTIONS  °Home care will depend on the cause of your pain. In general, °· Rest as directed by your caregiver. °· Drink enough fluids to keep your urine clear or pale yellow. °· Only take over-the-counter or prescription medicines as directed by your caregiver. Some medicines may help relieve the pain. °· Tell your caregiver about any changes in your pain. °· Follow up with your caregiver as directed. °SEEK IMMEDIATE MEDICAL CARE IF:  °· Your pain is not controlled with medicine.   °· You have new or worsening symptoms. °· Your pain increases.   °· You have abdominal pain.   °· You have shortness of breath.   °· You have persistent nausea or vomiting.   °· You have swelling in your abdomen.   °· You feel faint or pass out.   °· You have blood in your urine. °· You have a fever or persistent symptoms for more than 2-3 days. °· You have a fever and your symptoms suddenly get worse. °MAKE SURE YOU:  °· Understand these instructions. °· Will watch your condition. °· Will get help right away if you are not doing well or get worse. °  °This information is not intended to replace advice given to you by your health care provider. Make sure you discuss any questions you have with your health care provider. °  °Document Released: 05/29/2005 Document  Revised: 12/31/2011 Document Reviewed: 11/20/2011 °Elsevier Interactive Patient Education ©2016 Elsevier Inc. ° °

## 2015-06-04 ENCOUNTER — Emergency Department
Admission: EM | Admit: 2015-06-04 | Discharge: 2015-06-04 | Disposition: A | Discharge status: Home / Self Care | Payer: Medicare Other | Attending: Emergency Medicine | Admitting: Emergency Medicine

## 2015-06-04 ENCOUNTER — Encounter: Payer: Self-pay | Admitting: Emergency Medicine

## 2015-06-04 DIAGNOSIS — Z7984 Long term (current) use of oral hypoglycemic drugs: Secondary | ICD-10-CM | POA: Diagnosis not present

## 2015-06-04 DIAGNOSIS — E119 Type 2 diabetes mellitus without complications: Secondary | ICD-10-CM | POA: Diagnosis not present

## 2015-06-04 DIAGNOSIS — B3789 Other sites of candidiasis: Secondary | ICD-10-CM | POA: Insufficient documentation

## 2015-06-04 DIAGNOSIS — F17210 Nicotine dependence, cigarettes, uncomplicated: Secondary | ICD-10-CM | POA: Insufficient documentation

## 2015-06-04 DIAGNOSIS — I10 Essential (primary) hypertension: Secondary | ICD-10-CM | POA: Diagnosis not present

## 2015-06-04 DIAGNOSIS — Z7982 Long term (current) use of aspirin: Secondary | ICD-10-CM | POA: Insufficient documentation

## 2015-06-04 DIAGNOSIS — Z7902 Long term (current) use of antithrombotics/antiplatelets: Secondary | ICD-10-CM | POA: Insufficient documentation

## 2015-06-04 DIAGNOSIS — Z79899 Other long term (current) drug therapy: Secondary | ICD-10-CM | POA: Insufficient documentation

## 2015-06-04 DIAGNOSIS — R21 Rash and other nonspecific skin eruption: Secondary | ICD-10-CM | POA: Diagnosis present

## 2015-06-04 MED ORDER — FLUCONAZOLE 150 MG PO TABS: 150.0000 mg | ORAL_TABLET | Freq: Every day | ORAL | Status: AC

## 2015-06-04 NOTE — ED Notes (Signed)
States she developed painful rash under breast  And chest for couple days  No fever

## 2015-06-04 NOTE — Discharge Instructions (Signed)
Cutaneous Candidiasis °Cutaneous candidiasis is a condition in which there is an overgrowth of yeast (candida) on the skin. Yeast normally live on the skin, but in small enough numbers not to cause any symptoms. In certain cases, increased growth of the yeast may cause an actual yeast infection. This kind of infection usually occurs in areas of the skin that are constantly warm and moist, such as the armpits or the groin. Yeast is the most common cause of diaper rash in babies and in people who cannot control their bowel movements (incontinence). °CAUSES  °The fungus that most often causes cutaneous candidiasis is Candida albicans. Conditions that can increase the risk of getting a yeast infection of the skin include: °· Obesity. °· Pregnancy. °· Diabetes. °· Taking antibiotic medicine. °· Taking birth control pills. °· Taking steroid medicines. °· Thyroid disease. °· An iron or zinc deficiency. °· Problems with the immune system. °SYMPTOMS  °· Red, swollen area of the skin. °· Bumps on the skin. °· Itchiness. °DIAGNOSIS  °The diagnosis of cutaneous candidiasis is usually based on its appearance. Light scrapings of the skin may also be taken and viewed under a microscope to identify the presence of yeast. °TREATMENT  °Antifungal creams may be applied to the infected skin. In severe cases, oral medicines may be needed.  °HOME CARE INSTRUCTIONS  °· Keep your skin clean and dry. °· Maintain a healthy weight. °· If you have diabetes, keep your blood sugar under control. °SEEK IMMEDIATE MEDICAL CARE IF: °· Your rash continues to spread despite treatment. °· You have a fever, chills, or abdominal pain. °  °This information is not intended to replace advice given to you by your health care provider. Make sure you discuss any questions you have with your health care provider. °  °Document Released: 12/24/2010 Document Revised: 06/30/2011 Document Reviewed: 10/09/2014 °Elsevier Interactive Patient Education ©2016 Elsevier  Inc. ° °

## 2015-06-04 NOTE — ED Notes (Signed)
Painful bilateral rash chest and under breasts x 2 days.

## 2015-06-04 NOTE — ED Provider Notes (Signed)
[facility] Emergency Department Provider Note  ____________________________________________  Time seen: Approximately 6:15 PM  I have reviewed the triage vital signs and the nursing notes.   HISTORY  Chief Complaint Rash    HPI Jennifer [name] is a 55 y.o. female , NAD, presents to the emergency department with 3+ days painful rash between her breasts and underneath her breasts. Has tried multiple over-the-counter creams without any significant relief. Notes she sweats quite a bit in this area and has applied a cool compress which seems to help some. Denies fever, chills, body aches. Had some lower back pain over the weekend in which she was seen in this emergency department. Neither any rash about her back at this time.   Past Medical History  Diagnosis Date  . Hypertension   . High cholesterol   . Heart murmur   . Myocardial infarction ([place]) 2008; ~ 2012  . Chronic bronchitis ([place])     "get it q yr"  . Pneumonia     "get it 1-2 times/year" (06/14/2014)  . Sleep apnea     "they want me to do the lab but I haven't" (06/14/2014)  . RLS (restless legs syndrome)   . Type II diabetes mellitus ([place])   . GERD (gastroesophageal reflux disease)   . History of stomach ulcers   . Daily headache     "when my blood pressure is up" (06/14/2014)  . Migraine     "haven't had them in a good while; did get them 1-2 times/yr" (06/14/2014)  . Seizures ([place])   . Stroke [facility])     "they say I've had several" (06/14/2014)  . Arthritis     "qwhere" (06/14/2014)  . Anxiety   . Depression   . Bipolar disorder ([place])   . Kidney stones   . Urinary, incontinence, stress female   . Insomnia   . CAD (coronary artery disease)     a. cardiac cath: 2010: 95% mRCA s/p PCI/DES, 60% RPDA; b. cath 05/2014, widely patent LM, mild to mod dz in LAD and LCx, 90% dRCA s/p PCI/DES; c. cath 02/2015: severe ISR proximal segement of distal RCA stent s/p PCI/DES, nl EF by nuc 11/16, mildly elevated  LVEDP   . Kidney stones     Patient Active Problem List   Diagnosis Date Noted  . Chest pain with moderate risk for cardiac etiology 03/14/2015  . CAD (coronary artery disease)   . Hospital discharge follow-up 03/07/2015  . Poorly controlled type 2 diabetes mellitus with complication ([place]) 29/51/8841  . Hyperlipidemia 06/22/2014  . Stented coronary artery   . Chronic pain syndrome   . Unstable angina ([place])   . Chest pain at rest 06/14/2014  . Diabetes ([place]) 06/14/2014  . Acute bronchitis 06/14/2014  . Tobacco abuse 06/14/2014  . Essential hypertension 06/14/2014  . GERD (gastroesophageal reflux disease) 06/14/2014  . Bipolar disorder ([place]) 06/14/2014  . Chest pain 06/14/2014    Past Surgical History  Procedure Laterality Date  . Tonsillectomy    . Knee arthroscopy Left   . Extracorporeal shock wave lithotripsy  X 2  . Dilation and curettage of uterus    . Cesarean section  1984  . Tubal ligation    . Abdominal hysterectomy  1984    "I've got 1 ovary left"  . Coronary angioplasty with stent placement  2008; ~ 2012    "1; 1"  . Left heart catheterization with coronary angiogram N/A 06/15/2014    Procedure: LEFT HEART  CATHETERIZATION WITH CORONARY ANGIOGRAM;  Surgeon: Jettie Booze, MD;  Location: [facility] CATH LAB;  Service: Cardiovascular;  Laterality: N/A;  . Percutaneous coronary stent intervention (pci-s)  06/15/2014    Procedure: PERCUTANEOUS CORONARY STENT INTERVENTION (PCI-S);  Surgeon: Jettie Booze, MD;  Location: [facility] CATH LAB;  Service: Cardiovascular;;  . Cardiac catheterization N/A 02/26/2015    Procedure: Left Heart Cath and Coronary Angiography;  Surgeon: Wellington Hampshire, MD;  Location: [place] CV LAB;  Service: Cardiovascular;  Laterality: N/A;  . Cardiac catheterization N/A 02/26/2015    Procedure: Coronary Stent Intervention;  Surgeon: Wellington Hampshire, MD;  Location: [place] CV LAB;  Service: Cardiovascular;  Laterality: N/A;    Current  Outpatient Rx  Name  Route  Sig  Dispense  Refill  . amLODipine (NORVASC) 2.5 MG tablet   Oral   Take 1 tablet (2.5 mg total) by mouth daily.   30 tablet   0   . aspirin EC 81 MG EC tablet   Oral   Take 1 tablet (81 mg total) by mouth daily.   30 tablet   2   . atorvastatin (LIPITOR) 40 MG tablet   Oral   Take 1 tablet (40 mg total) by mouth daily at 6 PM.   30 tablet   2   . Blood Glucose Monitoring Suppl W/DEVICE KIT      Test 1-2 times daily; E11.65 diagnosis   1 each   0     Whichever device is covered by her insurance. Test ...   . carisoprodol (SOMA) 350 MG tablet   Oral   Take 1 tablet (350 mg total) by mouth 3 (three) times daily as needed (muscle spasms).   30 tablet   0   . clopidogrel (PLAVIX) 75 MG tablet   Oral   Take 1 tablet (75 mg total) by mouth daily with breakfast.   30 tablet   2   . COMBIVENT RESPIMAT 20-100 MCG/ACT AERS respimat   Inhalation   Inhale 1 puff into the lungs every 6 (six) hours as needed for wheezing or shortness of breath.   1 Inhaler   2     Dispense as written.   . fluconazole (DIFLUCAN) 150 MG tablet   Oral   Take 1 tablet (150 mg total) by mouth daily.   2 tablet   0   . gabapentin (NEURONTIN) 300 MG capsule   Oral   Take 3 capsules (900 mg total) by mouth 3 (three) times daily.   30 capsule   0   . lisinopril (PRINIVIL,ZESTRIL) 40 MG tablet   Oral   Take 1 tablet (40 mg total) by mouth daily.   30 tablet   2   . metaxalone (SKELAXIN) 800 MG tablet   Oral   Take 1 tablet (800 mg total) by mouth 3 (three) times daily.   20 tablet   1   . metFORMIN (GLUCOPHAGE) 500 MG tablet   Oral   Take 1 tablet (500 mg total) by mouth 2 (two) times daily with a meal.   60 tablet   2   . metoprolol succinate (TOPROL-XL) 200 MG 24 hr tablet   Oral   Take 1 tablet (200 mg total) by mouth daily. Take with or immediately following a meal.   30 tablet   02   . Multiple Vitamins-Minerals (MULTIVITAMIN WITH MINERALS)  tablet   Oral   Take 1 tablet by mouth daily.         [name]  nicotine (NICODERM CQ - DOSED IN MG/24 HOURS) 21 mg/24hr patch   Transdermal   Place 1 patch (21 mg total) onto the skin daily.   28 patch   0   . oxyCODONE (OXY IR/ROXICODONE) 5 MG immediate release tablet   Oral   Take 1 tablet (5 mg total) by mouth every 6 (six) hours as needed for moderate pain.   15 tablet   0   . VENTOLIN HFA 108 (90 BASE) MCG/ACT inhaler   Inhalation   Inhale 1-2 puffs into the lungs every 6 (six) hours as needed for wheezing or shortness of breath.            Dispense as written.     Allergies Lithium; Tegretol; Levaquin; and Lodine  Family History  Problem Relation Age of Onset  . Family history unknown: Yes    Social History Social History  Substance Use Topics  . Smoking status: Current Every Day Smoker -- 0.50 packs/day for 35 years    Types: Cigarettes  . Smokeless tobacco: Never Used     Comment: Patches  . Alcohol Use: 0.0 oz/week    0 Standard drinks or equivalent per week     Comment: 06/14/2014 "might drink twice/yr"     Review of Systems  Constitutional: No fever/chills ENT: No sore throat, mouth pain.  Cardiovascular: No chest pain. Respiratory: No cough. No shortness of breath. No wheezing.  Musculoskeletal: Negative for back pain.  Skin: Positive for rash under and between breasts. Neurological: Negative for headaches, focal weakness or numbness. 10-point ROS otherwise negative.  ____________________________________________   PHYSICAL EXAM:  VITAL SIGNS: ED Triage Vitals  Enc Vitals Group     BP 06/04/15 1724 150/77 mmHg     Pulse Rate 06/04/15 1724 76     Resp 06/04/15 1724 18     Temp 06/04/15 1724 98.2 F (36.8 C)     Temp Source 06/04/15 1724 Oral     SpO2 06/04/15 1724 95 %     Weight 06/04/15 1724 183 lb (83.008 kg)     Height 06/04/15 1724 _0  (1.575 m)     Head Cir --      Peak Flow --      Pain Score 06/04/15 1725 8     Pain Loc --       Pain Edu? --      Excl. in [place]? --     Constitutional: Alert and oriented. Well appearing and in no acute distress. Eyes: Conjunctivae are normal.  Head: Atraumatic. Cardiovascular:  Good peripheral circulation. Respiratory: Normal respiratory effort without tachypnea or retractions.  Neurologic:  Normal speech and language. No gross focal neurologic deficits are appreciated.  Skin:  Diffuse, erythematous papules between the breasts and beneath the breast. No vesicles, patches. No oozing or weeping. no induration.  Psychiatric: Mood and affect are normal. Speech and behavior are normal. Patient exhibits appropriate insight and judgement.   ____________________________________________   LABS  None   ____________________________________________  EKG  None ____________________________________________  RADIOLOGY  None ____________________________________________    PROCEDURES  Procedure(s) performed: None    Medications - No data to display   ____________________________________________   INITIAL IMPRESSION / ASSESSMENT AND PLAN / ED COURSE  Patient's diagnosis is consistent with candidal infection of the breast. Patient will be discharged home with prescriptions for a consult to take 1 tablet daily for 2 days. Patient advised to keep the affected area clean and dry. May utilize over-the-counter pallor sprays to assist  with keeping moisture to a minimum. Patient is to follow up with [facility] if symptoms persist past this treatment course. Patient is given ED precautions to return to the ED for any worsening or new symptoms.    ____________________________________________  FINAL CLINICAL IMPRESSION(S) / ED DIAGNOSES  Final diagnoses:  Candidiasis of breast      NEW MEDICATIONS STARTED DURING THIS VISIT:  Discharge Medication List as of 06/04/2015  6:24 PM    START taking these medications   Details  fluconazole (DIFLUCAN) 150 MG tablet Take 1  tablet (150 mg total) by mouth daily., Starting 06/04/2015, Until Wed 06/06/15, [place], PA-C 06/04/15 [place], MD 06/04/15 [id number]

## 2015-06-11 ENCOUNTER — Other Ambulatory Visit: Payer: Self-pay | Admitting: Cardiovascular Disease

## 2015-07-12 ENCOUNTER — Telehealth: Payer: Self-pay | Admitting: Cardiovascular Disease

## 2015-07-12 MED ORDER — AMLODIPINE BESYLATE 2.5 MG PO TABS: 2.5000 mg | ORAL_TABLET | Freq: Every day | ORAL | Status: DC

## 2015-07-12 MED ORDER — CLOPIDOGREL BISULFATE 75 MG PO TABS: 75.0000 mg | ORAL_TABLET | Freq: Every day | ORAL | Status: DC

## 2015-07-12 MED ORDER — METOPROLOL SUCCINATE ER 200 MG PO TB24: 200.0000 mg | ORAL_TABLET | Freq: Every day | ORAL | Status: DC

## 2015-07-12 MED ORDER — LISINOPRIL 40 MG PO TABS: 40.0000 mg | ORAL_TABLET | Freq: Every day | ORAL | Status: DC

## 2015-07-12 MED ORDER — ATORVASTATIN CALCIUM 40 MG PO TABS: 40.0000 mg | ORAL_TABLET | Freq: Every day | ORAL | Status: DC

## 2015-07-12 NOTE — Telephone Encounter (Signed)
Refill sent for Atorvastatin, Plavix, Metoprolol and Lisinopril

## 2015-07-12 NOTE — Telephone Encounter (Signed)
°*  STAT* If patient is at the pharmacy, call can be transferred to refill team.   1. Which medications need to be refilled? (please list name of each medication and dose if known) Atorvastatin Calcium, Plavix, and Metoprolol.Amlopine,Lisinprolol   2. Which pharmacy/location (including street and city if local pharmacy) is medication to be sent to? [facility] on [address].    3. Do they need a 30 day or 90 day supply? 90 day  Pt is coming on  09/04/15 to see Dr Mariah MillingGollan.  Explained to her we would try and send just enough to get her to the apportionment date.  She is out completely out on some.

## 2015-09-04 ENCOUNTER — Ambulatory Visit: Payer: Medicare Other | Admitting: Cardiovascular Disease

## 2015-10-11 ENCOUNTER — Ambulatory Visit: Payer: Medicare Other | Admitting: Cardiovascular Disease

## 2015-11-02 ENCOUNTER — Ambulatory Visit: Payer: Medicare Other | Admitting: Physician Assistant

## 2015-11-02 DIAGNOSIS — R0989 Other specified symptoms and signs involving the circulatory and respiratory systems: Secondary | ICD-10-CM

## 2015-11-27 NOTE — Progress Notes (Signed)
Cardiology Office Note Date:  11/28/2015  Patient ID:  Jennifer, [name] 12/01/1960, MRN [id number] PCP:  Rubbie Battiest, NP  Cardiologist:  Dr. Rockey Situ, MD    Chief Complaint: Follow up CAD  History of Present Illness: Jennifer [name] is a 55 y.o. female with history of CAD s/p multiple prior stents described below most recently 02/26/2015 to the distal RCA for in-stent restenosis, DM2, HTN, HLD, sleep apnea not on CPAP, ongoing tobacco abuse - smoking 1/4 pack of cigarettes daily, anxiety, and depression who presents for follow up of her CAD.   Patient previously underwent stenting to the RCA in 2008 followed by recurrence of chest pain in 2010 leading abnormal stress test that showed prior inferior infarct with peri-infarct ischemia. Cardiac cath showed 95% stenosis in the mid RCA, diffuse 60% stenosis in the proximal RDPA. She underwent successful PCI/DES of the mid RCA without complications. She redeevloped chest pain in 05/2014 and was transferred from [facility] to [facility]. Troponin was negative. Cardiac cath showed widely patent but small left main, mild disease in the LAD and its branches, mild to moderate disease in the LCx and its branches, severe 90% stenosis in the distal RCA s/p PCI/DES 2.5 x 20 Synergy drug eluting stent, normal LVSF, LVEDP 28 mm Hg. She was admitted again to the hospital on 02/23/15 with recurrence of chest pain with associated palpitations. Troponin was negative. ECG showed no acute changes. She underwent nuclear stress test on 11/4 that showed medium defect of moderate severity present in the mid inferoseptal, mid inferior and apical inferior location. This was consistent with ischemia. EF 54%. This was in intermediate risk study. She underwent cardiac cath on 02/26/15 that showed severe one-vessel CAD with severe ISR in the proximal segment of the previously placed stents in the distal RCA. She underwent successful PCI/DES to the distal RCA for ISR. Mildly elevated LVEDP. Normal EF  by nuclear stress testing. Case was complicated by the patient requesting more sedation though she was somnolent. After the case she was combative requesting medication stronger than fentanyl. Remaining cath details below. It was recommended she continue DAPT for at least 1 year and cotninue aggressive treatment of risk factors. She was seen in November 2016 for post-cath follow up and noted nonexertional chest pain lasting 5-10 seconds in duration. She had not missed any doses of DAPT (aspirin and Plavix). She had no exertional symptoms and could go all day running around without any symptoms. She underwent treadmill Myoview on 03/19/15 that was low risk with an EF of 55-65%.   Today, she is doing well. No further chest pain. She is under large amounts of stress at home with 2 daughters that are pregnant and raising a grandchild herself from another daughter. Her coping mechanism is smoking. She is now smoking < 1 pack daily. She has not missed any doses of DAPT. BP is well controlled at home. She remains active and states she could run circles around her kids and not get tired. No symptoms of claudication.   Past Medical History:  Diagnosis Date  . Anxiety   . Arthritis    "qwhere" (06/14/2014)  . Bipolar disorder ([place])   . CAD (coronary artery disease)    a. cardiac cath: 2010: 95% mRCA s/p PCI/DES, 60% RPDA; b. cath 05/2014, widely patent LM, mild to mod dz in LAD and LCx, 90% dRCA s/p PCI/DES; c. cath 02/2015: severe ISR proximal segement of distal RCA stent s/p PCI/DES, nl EF by nuc  11/16, mildly elevated LVEDP   . Chronic bronchitis ([place])    "get it q yr"  . Daily headache    "when my blood pressure is up" (06/14/2014)  . Depression   . GERD (gastroesophageal reflux disease)   . Heart murmur   . High cholesterol   . History of stomach ulcers   . Hypertension   . Insomnia   . Kidney stones   . Kidney stones   . Migraine    "haven't had them in a good while; did get them 1-2 times/yr"  (06/14/2014)  . Myocardial infarction ([place]) 2008; ~ 2012  . Pneumonia    "get it 1-2 times/year" (06/14/2014)  . RLS (restless legs syndrome)   . Seizures ([place])   . Sleep apnea    "they want me to do the lab but I haven't" (06/14/2014)  . Stroke [facility])    "they say I've had several" (06/14/2014)  . Type II diabetes mellitus ([place])   . Urinary, incontinence, stress female     Past Surgical History:  Procedure Laterality Date  . ABDOMINAL HYSTERECTOMY  1984   "I've got 1 ovary left"  . CARDIAC CATHETERIZATION N/A 02/26/2015   Procedure: Left Heart Cath and Coronary Angiography;  Surgeon: Wellington Hampshire, MD;  Location: [place] [facility];  Service: Cardiovascular;  Laterality: N/A;  . CARDIAC CATHETERIZATION N/A 02/26/2015   Procedure: Coronary Stent Intervention;  Surgeon: Wellington Hampshire, MD;  Location: [place] [facility];  Service: Cardiovascular;  Laterality: N/A;  . CESAREAN SECTION  1984  . CORONARY ANGIOPLASTY WITH STENT PLACEMENT  2008; ~ 2012   "1; 1"  . DILATION AND CURETTAGE OF UTERUS    . EXTRACORPOREAL SHOCK WAVE LITHOTRIPSY  X 2  . KNEE ARTHROSCOPY Left   . LEFT HEART CATHETERIZATION WITH CORONARY ANGIOGRAM N/A 06/15/2014   Procedure: LEFT HEART CATHETERIZATION WITH CORONARY ANGIOGRAM;  Surgeon: Jettie Booze, MD;  Location[facility] C[facility] LAB;  Service: Cardiovascular;  Laterality: N/A;  . PERCUTANEOUS CORONARY STENT INTERVENTION (PCI-S)  06/15/2014   Procedure: PERCUTANEOUS CORONARY STENT INTERVENTION (PCI-S);  Surgeon: Jettie Booze, MD;  Location: [facility] CATH LAB;  Service: Cardiovascular;;  . TONSILLECTOMY    . TUBAL LIGATION      Current Outpatient Prescriptions  Medication Sig Dispense Refill  . amLODipine (NORVASC) 2.5 MG tablet Take 1 tablet (2.5 mg total) by mouth daily. 90 tablet 3  . aspirin EC 81 MG EC tablet Take 1 tablet (81 mg total) by mouth daily. 30 tablet 2  . atorvastatin (LIPITOR) 40 MG tablet Take 1 tablet (40 mg total) by mouth daily at 6 PM.  90 tablet 3  . Blood Glucose Monitoring Suppl W/DEVICE KIT Test 1-2 times daily; E11.65 diagnosis 1 each 0  . clopidogrel (PLAVIX) 75 MG tablet Take 1 tablet (75 mg total) by mouth daily with breakfast. 90 tablet 3  . COMBIVENT RESPIMAT 20-100 MCG/ACT AERS respimat Inhale 1 puff into the lungs every 6 (six) hours as needed for wheezing or shortness of breath. 1 Inhaler 2  . gabapentin (NEURONTIN) 300 MG capsule Take 3 capsules (900 mg total) by mouth 3 (three) times daily. 30 capsule 0  . ibuprofen (ADVIL,MOTRIN) 800 MG tablet Take 800 mg by mouth every 6 (six) hours as needed.    [name] lisinopril (PRINIVIL,ZESTRIL) 40 MG tablet Take 1 tablet (40 mg total) by mouth daily. 90 tablet 3  . metoprolol (TOPROL-XL) 200 MG 24 hr tablet Take 1 tablet (200 mg total) by  mouth daily. Take with or immediately following a meal. 90 tablet 3  . Multiple Vitamins-Minerals (MULTIVITAMIN WITH MINERALS) tablet Take 1 tablet by mouth daily.    [name] oxyCODONE (OXY IR/ROXICODONE) 5 MG immediate release tablet Take 1 tablet (5 mg total) by mouth every 6 (six) hours as needed for moderate pain. 15 tablet 0  . furosemide (LASIX) 20 MG tablet Take 1 tablet (20 mg total) by mouth daily. 30 tablet 3  . nicotine (NICODERM CQ - DOSED IN MG/24 HOURS) 21 mg/24hr patch Place 1 patch (21 mg total) onto the skin daily. 28 patch 11   No current facility-administered medications for this visit.     Allergies:   Lithium; Tegretol [carbamazepine]; Levaquin [levofloxacin in d5w]; and Lodine [etodolac]   Social History:  The patient  reports that she has been smoking Cigarettes.  She has a 35.00 pack-year smoking history. She has never used smokeless tobacco. She reports that she drinks alcohol. She reports that she does not use drugs.   Family History:  The patient's Family history is unknown by patient.  ROS:   Review of Systems  Constitutional: Negative for chills, diaphoresis, fever, malaise/fatigue and weight loss.  HENT: Negative  for congestion.   Eyes: Negative for discharge and redness.  Respiratory: Negative for cough, sputum production, shortness of breath and wheezing.   Cardiovascular: Negative for chest pain, palpitations, orthopnea, claudication, leg swelling and PND.  Gastrointestinal: Negative for abdominal pain, blood in stool, heartburn, melena, nausea and vomiting.  Genitourinary: Negative for hematuria.  Musculoskeletal: Negative for falls and myalgias.  Skin: Negative for rash.  Neurological: Negative for dizziness, tingling, tremors, sensory change, speech change, focal weakness, loss of consciousness and weakness.  Endo/Heme/Allergies: Does not bruise/bleed easily.  Psychiatric/Behavioral: Positive for substance abuse. The patient is nervous/anxious.      PHYSICAL EXAM:  VS:  BP 120/80 (BP Location: Left Arm, Patient Position: Sitting, Cuff Size: Normal)   Pulse 81   Ht _0  (1.575 m)   Wt 182 lb 8 oz (82.8 kg)   BMI 33.38 kg/m  BMI: Body mass index is 33.38 kg/m.  Physical Exam  Constitutional: She is oriented to person, place, and time. Vital signs are normal. She appears well-developed and well-nourished.  HENT:  Head: Normocephalic and atraumatic.  Eyes: Right eye exhibits no discharge. Left eye exhibits no discharge.  Neck: Normal range of motion. No JVD present.  Cardiovascular: Normal rate, regular rhythm, S1 normal, S2 normal and normal heart sounds.  Exam reveals no distant heart sounds, no friction rub, no midsystolic click and no opening snap.   No murmur heard. Pulmonary/Chest: Effort normal and breath sounds normal. No respiratory distress. She has no decreased breath sounds. She has no wheezes. She has no rales. She exhibits no tenderness.  Abdominal: Soft. She exhibits no distension. There is no tenderness.  Musculoskeletal: She exhibits no edema.  Neurological: She is alert and oriented to person, place, and time.  Skin: Skin is warm and dry. No cyanosis. Nails show no  clubbing.  Psychiatric: She has a normal mood and affect. Her speech is normal and behavior is normal. Judgment and thought content normal.     EKG:  Was ordered and interpreted by me today. Shows NSR, 81 bpm, no acute st/t changes.   Recent Labs: 02/23/2015: ALT 20; TSH 0.266 02/27/2015: BUN 14; Creatinine, Ser 0.58; Hemoglobin 11.8; Platelets 198; Potassium 4.2; Sodium 143  No results found for requested labs within last 8760 hours.   CrCl  cannot be calculated (Patient's most recent lab result is older than the maximum 21 days allowed.).   Wt Readings from Last 3 Encounters:  11/28/15 182 lb 8 oz (82.8 kg)  06/04/15 183 lb (83 kg)  03/14/15 189 lb (85.7 kg)     Other studies reviewed: Additional studies/records reviewed today include: summarized above  ASSESSMENT AND PLAN:  1. CAD as above: No symptoms concerning for angina. Has not missed any doses of aspirin or Plavix. Continue DAPT until at least 02/2016. At that time she would like to stop Plavix as she has several surgeries she has put on hold that she would like to have. Continue Toprol XL 200 mg daily. No plans for further ischemic evaluation at this time.   2. HTN: Well controlled. Continue current medications.   3. HLD: Continue Lipitor.   4. Tobacco abuse: Cessation advised. Now smoking <1 ppd. Retrial of nicotine patches.   5. Chronic diastolic CHF: She does not appear volume overloaded at this time. Recheck echo to assess LV function and right-sided pressure.   6. Stress/anxiety: Very large issues for her. Follow up with PCP.   Disposition: F/u with Dr. Rockey Situ, MD in 3 months   Current medicines are reviewed at length with the patient today.  The patient did not have any concerns regarding medicines.  Melvern Banker PA-C 11/28/2015 3:45 PM     [address] [place], [place] [address] [phone]

## 2015-11-28 ENCOUNTER — Ambulatory Visit (INDEPENDENT_AMBULATORY_CARE_PROVIDER_SITE_OTHER): Payer: Medicare Other | Admitting: Physician Assistant

## 2015-11-28 ENCOUNTER — Encounter: Payer: Self-pay | Admitting: Physician Assistant

## 2015-11-28 VITALS — BP 120/80 | HR 81 | Ht 62.0 in | Wt 182.5 lb

## 2015-11-28 DIAGNOSIS — I10 Essential (primary) hypertension: Secondary | ICD-10-CM | POA: Diagnosis not present

## 2015-11-28 DIAGNOSIS — I2510 Atherosclerotic heart disease of native coronary artery without angina pectoris: Secondary | ICD-10-CM

## 2015-11-28 DIAGNOSIS — E785 Hyperlipidemia, unspecified: Secondary | ICD-10-CM

## 2015-11-28 DIAGNOSIS — I5032 Chronic diastolic (congestive) heart failure: Secondary | ICD-10-CM

## 2015-11-28 DIAGNOSIS — I25119 Atherosclerotic heart disease of native coronary artery with unspecified angina pectoris: Secondary | ICD-10-CM

## 2015-11-28 DIAGNOSIS — F419 Anxiety disorder, unspecified: Secondary | ICD-10-CM

## 2015-11-28 DIAGNOSIS — Z720 Tobacco use: Secondary | ICD-10-CM

## 2015-11-28 MED ORDER — FUROSEMIDE 20 MG PO TABS: 20.0000 mg | ORAL_TABLET | Freq: Every day | ORAL | 3 refills | Status: DC

## 2015-11-28 MED ORDER — NICOTINE 21 MG/24HR TD PT24: 21.0000 mg | MEDICATED_PATCH | Freq: Every day | TRANSDERMAL | 11 refills | Status: DC

## 2015-11-28 NOTE — Patient Instructions (Signed)
Medication Instructions:  Your physician has recommended you make the following change in your medication:  START taking lasix 20mg  once daily START nicotine patches    Labwork: BMET today BMET in one week  Testing/Procedures: none  Follow-Up: Your physician recommends that you schedule a follow-up appointment in: November with Dr. Mariah MillingGollan or Eula Listenyan Dunn, PA-C    Any Other Special Instructions Will Be Listed Below (If Applicable).     If you need a refill on your cardiac medications before your next appointment, please call your pharmacy.

## 2015-11-29 ENCOUNTER — Telehealth: Payer: Self-pay | Admitting: *Deleted

## 2015-11-29 ENCOUNTER — Telehealth: Payer: Self-pay | Admitting: Nurse Practitioner

## 2015-11-29 LAB — BASIC METABOLIC PANEL
BUN/Creatinine Ratio: 16 (ref 9–23)
BUN: 15 mg/dL (ref 6–24)
CALCIUM: 9.7 mg/dL (ref 8.7–10.2)
CO2: 21 mmol/L (ref 18–29)
Chloride: 97 mmol/L (ref 96–106)
Creatinine, Ser: 0.96 mg/dL (ref 0.57–1.00)
GFR calc Af Amer: 79 mL/min/{1.73_m2} (ref >59)
GFR, EST NON AFRICAN AMERICAN: 68 mL/min/{1.73_m2} (ref >59)
Glucose: 492 mg/dL — ABNORMAL HIGH (ref 65–99)
Potassium: 4.5 mmol/L (ref 3.5–5.2)
Sodium: 138 mmol/L (ref 134–144)

## 2015-11-29 NOTE — Telephone Encounter (Signed)
Pt transferred to nurse line blood sugar is 492

## 2015-11-29 NOTE — Telephone Encounter (Signed)
Patient Name: Jennifer SenegalLISA Wadle DOB: 1960-12-01 Initial Comment Caller states saw cardio MD yesterday to put on fluid pills; blood work and was adv to see PCP; BS 492 yesterday; when tried to take metformin passed out; fatigue and dizziness; passed out last week; Nurse Assessment Nurse: Roma KayserForsythe, RN, Santina Evansatherine Date/Time (Eastern Time): 11/29/2015 11:54:23 AM Confirm and document reason for call. If symptomatic, describe symptoms. You must click the next button to save text entered. ---caller states she was seen by cardiologist yesterday and they called her today with her bloodwork results BS was 492, she has been on metformin in the past but had a bad reaction and has always just been borderline.she has been feeling tired for a long time Has the patient traveled out of the country within the last 30 days? ---Not Applicable Does the patient have any new or worsening symptoms? ---Yes Will a triage be completed? ---Yes Related visit to physician within the last 2 weeks? ---Yes Does the PT have any chronic conditions? (i.e. diabetes, asthma, etc.) ---Yes List chronic conditions. ---DM, chronic pain Is the patient pregnant or possibly pregnant? (Ask all females between the ages of 6612-55) ---No Is this a behavioral health or substance abuse call? ---No Guidelines Guideline Title Affirmed Question Affirmed Notes Diabetes - High Blood Sugar Blood glucose > 400 mg/dl (22 mmol/l) Final Disposition User Call PCP Now Roma KayserForsythe, RN, Santina Evansatherine Referrals [facility] - ED Disagree/Comply: Comply

## 2015-12-07 ENCOUNTER — Other Ambulatory Visit
Admission: RE | Admit: 2015-12-07 | Discharge: 2015-12-07 | Disposition: A | Discharge status: Home / Self Care | Payer: Medicare Other | Source: Ambulatory Visit | Attending: Physician Assistant | Admitting: Physician Assistant

## 2015-12-07 ENCOUNTER — Other Ambulatory Visit: Payer: Medicare Other

## 2015-12-07 DIAGNOSIS — I10 Essential (primary) hypertension: Secondary | ICD-10-CM | POA: Diagnosis present

## 2015-12-07 LAB — BASIC METABOLIC PANEL
Anion gap: 7 (ref 5–15)
BUN: 23 mg/dL — AB (ref 6–20)
CALCIUM: 9.3 mg/dL (ref 8.9–10.3)
CO2: 26 mmol/L (ref 22–32)
Chloride: 106 mmol/L (ref 101–111)
Creatinine, Ser: 0.65 mg/dL (ref 0.44–1.00)
GFR calc Af Amer: >60 mL/min (ref >60)
GLUCOSE: 278 mg/dL — AB (ref 65–99)
POTASSIUM: 4.2 mmol/L (ref 3.5–5.1)
SODIUM: 139 mmol/L (ref 135–145)

## 2015-12-18 ENCOUNTER — Ambulatory Visit: Payer: Medicare Other | Admitting: Physician Assistant

## 2016-03-07 ENCOUNTER — Ambulatory Visit: Payer: Medicare Other | Admitting: Cardiovascular Disease

## 2016-03-17 ENCOUNTER — Ambulatory Visit: Payer: Medicare Other | Admitting: Cardiovascular Disease

## 2016-03-20 ENCOUNTER — Encounter: Payer: Self-pay | Admitting: *Deleted

## 2016-03-20 ENCOUNTER — Ambulatory Visit: Payer: Medicare Other | Admitting: Cardiovascular Disease

## 2016-04-23 ENCOUNTER — Other Ambulatory Visit: Payer: Self-pay | Admitting: Physician Assistant

## 2016-04-28 ENCOUNTER — Other Ambulatory Visit: Payer: Self-pay | Admitting: Cardiovascular Disease

## 2016-04-28 NOTE — Telephone Encounter (Signed)
Pt needs f/u visit with Gollan. Thanks.

## 2016-05-02 ENCOUNTER — Encounter: Payer: Self-pay | Admitting: Cardiovascular Disease

## 2016-05-02 ENCOUNTER — Ambulatory Visit (INDEPENDENT_AMBULATORY_CARE_PROVIDER_SITE_OTHER): Payer: Medicare Other | Admitting: Cardiovascular Disease

## 2016-05-02 VITALS — BP 140/82 | HR 85 | Ht 62.0 in | Wt 195.2 lb

## 2016-05-02 DIAGNOSIS — I10 Essential (primary) hypertension: Secondary | ICD-10-CM

## 2016-05-02 DIAGNOSIS — I5032 Chronic diastolic (congestive) heart failure: Secondary | ICD-10-CM

## 2016-05-02 DIAGNOSIS — I2510 Atherosclerotic heart disease of native coronary artery without angina pectoris: Secondary | ICD-10-CM

## 2016-05-02 DIAGNOSIS — E118 Type 2 diabetes mellitus with unspecified complications: Secondary | ICD-10-CM

## 2016-05-02 DIAGNOSIS — M79604 Pain in right leg: Secondary | ICD-10-CM

## 2016-05-02 DIAGNOSIS — Z720 Tobacco use: Secondary | ICD-10-CM

## 2016-05-02 DIAGNOSIS — E1165 Type 2 diabetes mellitus with hyperglycemia: Secondary | ICD-10-CM

## 2016-05-02 DIAGNOSIS — M79605 Pain in left leg: Secondary | ICD-10-CM

## 2016-05-02 DIAGNOSIS — E782 Mixed hyperlipidemia: Secondary | ICD-10-CM

## 2016-05-02 NOTE — Patient Instructions (Addendum)
Medication Instructions:   No medication changes made  Labwork:  No new labs needed  Testing/Procedures:  No further testing at this time   I recommend watching educational videos on topics of interest to you at:       www.goemmi.com  Enter code: HEARTCARE    Follow-Up: It was a pleasure seeing you in the office today. Please call us if you have new issues that need to be addressed before your next appt.  [phone]  Your physician wants you to follow-up in: 6 months.  You will receive a reminder letter in the mail two months in advance. If you don't receive a letter, please call our office to schedule the follow-up appointment.  If you need a refill on your cardiac medications before your next appointment, please call your pharmacy.       1-800-Quit-Now  Steps to Quit Smoking Smoking tobacco can be bad for your health. It can also affect almost every organ in your body. Smoking puts you and people around you at risk for many serious long-lasting (chronic) diseases. Quitting smoking is hard, but it is one of the best things that you can do for your health. It is never too late to quit. What are the benefits of quitting smoking? When you quit smoking, you lower your risk for getting serious diseases and conditions. They can include:  Lung cancer or lung disease.  Heart disease.  Stroke.  Heart attack.  Not being able to have children (infertility).  Weak bones (osteoporosis) and broken bones (fractures). If you have coughing, wheezing, and shortness of breath, those symptoms may get better when you quit. You may also get sick less often. If you are pregnant, quitting smoking can help to lower your chances of having a baby of low birth weight. What can I do to help me quit smoking? Talk with your doctor about what can help you quit smoking. Some things you can do (strategies) include:  Quitting smoking totally, instead of slowly cutting back how much you smoke  over a period of time.  Going to in-person counseling. You are more likely to quit if you go to many counseling sessions.  Using resources and support systems, such as:  Online chats with a Veterinary surgeoncounselor.  Phone quitlines.  Printed Materials engineerself-help materials.  Support groups or group counseling.  Text messaging programs.  Mobile phone apps or applications.  Taking medicines. Some of these medicines may have nicotine in them. If you are pregnant or breastfeeding, do not take any medicines to quit smoking unless your doctor says it is okay. Talk with your doctor about counseling or other things that can help you. Talk with your doctor about using more than one strategy at the same time, such as taking medicines while you are also going to in-person counseling. This can help make quitting easier. What things can I do to make it easier to quit? Quitting smoking might feel very hard at first, but there is a lot that you can do to make it easier. Take these steps:  Talk to your family and friends. Ask them to support and encourage you.  Call phone quitlines, reach out to support groups, or work with a Veterinary surgeoncounselor.  Ask people who smoke to not smoke around you.  Avoid places that make you want (trigger) to smoke, such as:  Bars.  Parties.  Smoke-break areas at work.  Spend time with people who do not smoke.  Lower the stress in your life. Stress can make  you want to smoke. Try these things to help your stress:  Getting regular exercise.  Deep-breathing exercises.  Yoga.  Meditating.  Doing a body scan. To do this, close your eyes, focus on one area of your body at a time from head to toe, and notice which parts of your body are tense. Try to relax the muscles in those areas.  Download or buy apps on your mobile phone or tablet that can help you stick to your quit plan. There are many free apps, such as QuitGuide from the [employer] developer for Disease Control and Prevention). You can find  more support from smokefree.gov and other websites. This information is not intended to replace advice given to you by your health care provider. Make sure you discuss any questions you have with your health care provider. Document Released: 02/01/2009 Document Revised: 12/04/2015 Document Reviewed: 08/22/2014 Elsevier Interactive Patient Education  2017 Elsevier Inc.  Smoking Hazards Smoking cigarettes is extremely bad for your health. Tobacco smoke has over 200 known poisons in it. It contains the poisonous gases nitrogen oxide and carbon monoxide. There are over 60 chemicals in tobacco smoke that cause cancer. Some of the chemicals found in cigarette smoke include:   Cyanide.   Benzene.   Formaldehyde.   Methanol (wood alcohol).   Acetylene (fuel used in welding torches).   Ammonia.  Even smoking lightly shortens your life expectancy by several years. You can greatly reduce the risk of medical problems for you and your family by stopping now. Smoking is the most preventable cause of death and disease in our society. Within days of quitting smoking, your circulation improves, you decrease the risk of having a heart attack, and your lung capacity improves. There may be some increased phlegm in the first few days after quitting, and it may take months for your lungs to clear up completely. Quitting for 10 years reduces your risk of developing lung cancer to almost that of a nonsmoker.  WHAT ARE THE RISKS OF SMOKING? Cigarette smokers have an increased risk of many serious medical problems, including:  Lung cancer.   Lung disease (such as pneumonia, bronchitis, and emphysema).   Heart attack and chest pain due to the heart not getting enough oxygen (angina).   Heart disease and peripheral blood vessel disease.   Hypertension.   Stroke.   Oral cancer (cancer of the lip, mouth, or voice box).   Bladder cancer.   Pancreatic cancer.   Cervical cancer.    Pregnancy complications, including premature birth.   Stillbirths and smaller newborn babies, birth defects, and genetic damage to sperm.   Early menopause.   Lower estrogen level for women.   Infertility.   Facial wrinkles.   Blindness.   Increased risk of broken bones (fractures).   Senile dementia.   Stomach ulcers and internal bleeding.   Delayed wound healing and increased risk of complications during surgery. Because of secondhand smoke exposure, children of smokers have an increased risk of the following:   Sudden infant death syndrome (SIDS).   Respiratory infections.   Lung cancer.   Heart disease.   Ear infections.  WHY IS SMOKING ADDICTIVE? Nicotine is the chemical agent in tobacco that is capable of causing addiction or dependence. When you smoke and inhale, nicotine is absorbed rapidly into the bloodstream through your lungs. Both inhaled and noninhaled nicotine may be addictive.  WHAT ARE THE BENEFITS OF QUITTING?  There are many health benefits to quitting smoking. Some are:  The likelihood of developing cancer and heart disease decreases. Health improvements are seen almost immediately.   Blood pressure, pulse rate, and breathing patterns start returning to normal soon after quitting.   People who quit may see an improvement in their overall quality of life.  HOW DO YOU QUIT SMOKING? Smoking is an addiction with both physical and psychological effects, and longtime habits can be hard to change. Your health care provider can recommend:  Programs and community resources, which may include group support, education, or therapy.  Replacement products, such as patches, gum, and nasal sprays. Use these products only as directed. Do not replace cigarette smoking with electronic cigarettes (commonly called e-cigarettes). The safety of e-cigarettes is unknown, and some may contain harmful chemicals. FOR MORE INFORMATION  American Lung  Association: www.lung.org  American Cancer Society: www.cancer.org This information is not intended to replace advice given to you by your health care provider. Make sure you discuss any questions you have with your health care provider. Document Released: 05/15/2004 Document Revised: 07/30/2015 Document Reviewed: 09/27/2012 Elsevier Interactive Patient Education  2017 Elsevier Inc. Secondhand Smoke WHAT IS SECONDHAND SMOKE? Secondhand smoke is smoke that comes from burning tobacco. It could be the smoke from a cigarette, a pipe, or a cigar. Even if you are not the one smoking, secondhand smoke exposes you to the dangers of smoking. This is called involuntary, or passive, smoking. There are two types of secondhand smoke:  Sidestream smoke is the smoke that comes off the lighted end of a cigarette, pipe, or cigar.  This type of smoke has the highest amount of cancer-causing agents (carcinogens).  The particles in sidestream smoke are smaller. They get into your lungs more easily.  Mainstream smoke is the smoke that is exhaled by a person who is smoking.  This type of smoke is also dangerous to your health. HOW CAN SECONDHAND SMOKE AFFECT MY HEALTH? Studies show that there is no safe level of secondhand smoke. This smoke contains thousands of chemicals. At least 69 of them are known to cause cancer. Secondhand smoke can also cause many other health problems. It has been linked to:  Lung cancer.  Cancer of the voice box (larynx) or throat.  Cancer of the sinuses.  Brain cancer.  Bladder cancer.  Stomach cancer.  Breast cancer.  White blood cell cancers (lymphoma and leukemia).  Brain and liver tumors in children.  Heart disease and stroke in adults.  Pregnancy loss (miscarriage).  Diseases in children, such as:  Asthma.  Lung infections.  Ear infections.  Sudden infant death syndrome (SIDS).  Slow growth. WHERE CAN I BE AT RISK FOR EXPOSURE TO SECONDHAND  SMOKE?  For adults, the workplace is the main source of exposure to secondhand smoke.  Your workplace should have a policy separating smoking areas from nonsmoking areas.  Smoking areas should have a system for ventilating and cleaning the air.  For children, the home may be the most dangerous place for exposure to secondhand smoke.  Children who live in apartment buildings may be at risk from smoke drifting from hallways or other people's homes.  For everyone, many public places are possible sources of exposure to secondhand smoke.  These places include restaurants, shopping centers, and parks. HOW CAN I REDUCE MY RISK FOR EXPOSURE TO SECONDHAND SMOKE? The most important thing you can do is not smoke. Discourage family members from smoking. Other ways to reduce exposure for you and your family include the following:  Keep your home smoke  free.  Make sure your child care providers do not smoke.  Warn your child about the dangers of smoking and secondhand smoke.  Do not allow smoking in your car. When someone smokes in a car, all the damaging chemicals from the smoke are confined in a small area.  Avoid public places where smoking is allowed. This information is not intended to replace advice given to you by your health care provider. Make sure you discuss any questions you have with your health care provider. Document Released: 05/15/2004 Document Revised: 11/15/2015 Document Reviewed: 07/22/2013 Elsevier Interactive Patient Education  2017 [employer].

## 2016-05-02 NOTE — Telephone Encounter (Signed)
Pt is coming this afternoon for her appointment with Dr Mariah MillingGollan

## 2016-05-02 NOTE — Progress Notes (Signed)
Cardiology Office Note  Date:  05/02/2016   ID:  Jennifer [name], DOB 1961-04-10, MRN [id number]  PCP:  Rubbie Battiest, NP   Chief Complaint  Patient presents with  . other    Follow up from refill. Meds reviewed by the pt. verbally. Pt. has a lot of stress in her life.     HPI:  Jennifer [name] is a 56 year old woman with long history of smoking who continues to smoke, coronary artery disease, stent to the mid RCA 2008, repeat stenting to the mid RCA 2012 ,  cardiac catheterization February 2016 showing severe distal RCA disease estimated at 90%, patent mid RCA stents, also with 70% mid to distal circumflex disease of a small vessel, presenting for routine follow-up of her coronary artery disease Last seen in the clinic 11/28/2015  In follow-up Jennifer [name] reports having significant stress at home Recently lost her 35-monthold grandson, died in the crib Jennifer [name] attempted rescue breaths  Jennifer [name] reports having worsening diabetes, recent hemoglobin A1c greater than 13 Recently started on insulin,  struggles to keep her sugars down  Despite her best efforts with diet, did not notice much improvement  long discussion today concerning her diet  Thyroid nodule, [facility]  Last stress test 03/19/2015  Jennifer [name] reports having significant leg pain, unable to walk very far secondary to discomfort.   reports having chronic pain issues, sees the pain clinic  Continues to smoke one pack per day Previously tried Chantix, did not remember if Jennifer [name] had side effects Vapor cigarettes do not seem to work, unable to afford nicotine patches   EKG on today's visit shows normal sinus rhythm with rate 85 bpm, no significant ST or T-wave changes  Other past medical history  Cardiac catheterization February 2016   The left main coronary artery is a small vessel but widely patent. The left anterior descending artery,  In the ostium of the lower pole, there is a moderate stenosis. The left  circumflex artery AV groove circumflex has a focal 70% stenosis. This vessel is quite small, likely less than 2 mm in diameter. The right coronary artery stents in the mid right coronary artery appear widely patent. There is mild disease proximally. In the distal RCA, moderate disease followed by a focal severe, 90% stenosis.  A 2.5 x 27 drug-eluting stent was deployed. postdilated with a 3.0 x 15 balloon.    PMH:   has a past medical history of Anxiety; Arthritis; Bipolar disorder ([place]; CAD (coronary artery disease); Chronic bronchitis ([place]; Daily headache; Depression; GERD (gastroesophageal reflux disease); Heart murmur; High cholesterol; History of stomach ulcers; Hypertension; Insomnia; Kidney stones; Kidney stones; Migraine; Myocardial infarction (2008; ~ 2012); Pneumonia; RLS (restless legs syndrome); Seizures ([place]; Sleep apnea; Stroke ([facility]; Type II diabetes mellitus [place]; and Urinary, incontinence, stress female.  PSH:    Past Surgical History:  Procedure Laterality Date  . ABDOMINAL HYSTERECTOMY  1984   "I've got 1 ovary left"  . CARDIAC CATHETERIZATION N/A 02/26/2015   Procedure: Left Heart Cath and Coronary Angiography;  Surgeon: MWellington Hampshire MD;  [facility];  Service: Cardiovascular;  Laterality: N/A;  . CARDIAC CATHETERIZATION N/A 02/26/2015   Procedure: Coronary Stent Intervention;  Surgeon: MWellington Hampshire MD;  [facility];  Service: Cardiovascular;  Laterality: N/A;  . CESAREAN SECTION  1984  . CORONARY ANGIOPLASTY WITH STENT PLACEMENT  2008; ~ 2012   "1; 1"  . DILATION AND CURETTAGE OF UTERUS    .  EXTRACORPOREAL SHOCK WAVE LITHOTRIPSY  X 2  . KNEE ARTHROSCOPY Left   . LEFT HEART CATHETERIZATION WITH CORONARY ANGIOGRAM N/A 06/15/2014   Procedure: LEFT HEART CATHETERIZATION WITH CORONARY ANGIOGRAM;  Surgeon: Jettie Booze, MD;  Location: [facility] CATH LAB;  Service: Cardiovascular;  Laterality: N/A;  . PERCUTANEOUS CORONARY STENT  INTERVENTION (PCI-S)  06/15/2014   Procedure: PERCUTANEOUS CORONARY STENT INTERVENTION (PCI-S);  Surgeon: Jettie Booze, MD;  Location: [facility] CATH LAB;  Service: Cardiovascular;;  . TONSILLECTOMY    . TUBAL LIGATION      Current Outpatient Prescriptions  Medication Sig Dispense Refill  . amLODipine (NORVASC) 2.5 MG tablet Take 1 tablet (2.5 mg total) by mouth daily. 90 tablet 3  . aspirin EC 81 MG EC tablet Take 1 tablet (81 mg total) by mouth daily. 30 tablet 2  . atorvastatin (LIPITOR) 40 MG tablet take 1 tablet by mouth once daily AT 6PM AS DIRECTED 90 tablet 0  . Blood Glucose Monitoring Suppl W/DEVICE KIT Test 1-2 times daily; E11.65 diagnosis 1 each 0  . clopidogrel (PLAVIX) 75 MG tablet Take 1 tablet (75 mg total) by mouth daily with breakfast. 90 tablet 3  . COMBIVENT RESPIMAT 20-100 MCG/ACT AERS respimat Inhale 1 puff into the lungs every 6 (six) hours as needed for wheezing or shortness of breath. 1 Inhaler 2  . furosemide (LASIX) 20 MG tablet take 1 tablet by mouth once daily 30 tablet 3  . gabapentin (NEURONTIN) 300 MG capsule Take 3 capsules (900 mg total) by mouth 3 (three) times daily. 30 capsule 0  . ibuprofen (ADVIL,MOTRIN) 800 MG tablet Take 800 mg by mouth every 6 (six) hours as needed.    [name] lisinopril (PRINIVIL,ZESTRIL) 40 MG tablet Take 1 tablet (40 mg total) by mouth daily. 90 tablet 3  . metoprolol (TOPROL-XL) 200 MG 24 hr tablet take 1 tablet by mouth once daily WITH OR IMMEDIATELY FOLLOWING A MEAL 90 tablet 0  . Multiple Vitamins-Minerals (MULTIVITAMIN WITH MINERALS) tablet Take 1 tablet by mouth daily.    . nicotine (NICODERM CQ - DOSED IN MG/24 HOURS) 21 mg/24hr patch Place 1 patch (21 mg total) onto the skin daily. 28 patch 11  . oxyCODONE (OXY IR/ROXICODONE) 5 MG immediate release tablet Take 1 tablet (5 mg total) by mouth every 6 (six) hours as needed for moderate pain. 15 tablet 0   No current facility-administered medications for this visit.       Allergies:   Lithium; Tegretol [carbamazepine]; Levaquin [levofloxacin in d5w]; and Lodine [etodolac]   Social History:  The patient  reports that Jennifer [name] has been smoking Cigarettes.  Jennifer [name] has a 35.00 pack-year smoking history. Jennifer [name] has never used smokeless tobacco. Jennifer [name] reports that Jennifer [name] drinks alcohol. Jennifer [name] reports that Jennifer [name] does not use drugs.   Family History:   Family history is unknown by patient.    Review of Systems: Review of Systems  Constitutional: Negative.   Respiratory: Negative.   Cardiovascular: Negative.   Gastrointestinal: Negative.   Musculoskeletal:       Bilateral leg pain with walking  Neurological: Negative.   Psychiatric/Behavioral: Positive for depression. The patient is nervous/anxious.   All other systems reviewed and are negative.    PHYSICAL EXAM: VS:  BP 140/82 (BP Location: Left Arm, Patient Position: Sitting, Cuff Size: Normal)   Pulse 85   Ht _0  (1.575 m)   Wt 195 lb 4 oz (88.6 kg)   BMI 35.71 kg/m  , BMI Body mass index is 35.71  kg/m. GEN: Well nourished, well developed, in no acute distress , Obese  HEENT: normal  Neck: no JVD, carotid bruits, or masses Cardiac: RRR; no murmurs, rubs, or gallops,no edema  Respiratory:  clear to auscultation bilaterally, normal work of breathing GI: soft, nontender, nondistended, + BS MS: no deformity or atrophy  Skin: warm and dry, no rash Neuro:  Strength and sensation are intact Psych: euthymic mood, full affect    Recent Labs: 12/07/2015: BUN 23; Creatinine, Ser 0.65; Potassium 4.2; Sodium 139    Lipid Panel No results found for: CHOL, HDL, LDLCALC, TRIG    Wt Readings from Last 3 Encounters:  05/02/16 195 lb 4 oz (88.6 kg)  11/28/15 182 lb 8 oz (82.8 kg)  06/04/15 183 lb (83 kg)       ASSESSMENT AND PLAN:  CAD in native artery - Plan: EKG 12-Lead Long discussion concerning risk factors for worsening coronary disease Currently with no symptoms of angina. No further workup at this  time. Continue current medication regimen.  Essential hypertension - Plan: EKG 12-Lead Blood pressure borderline elevated, no medication changes made today Significant stress at home   Chronic diastolic CHF (congestive heart failure) ([place]) - Plan: EKG 12-Lead Encouraged her to stay on her Lasix daily. Appears euvolemic  Pain in both lower extremities Atypical in nature, sounds more neuropathic Has chronic pain, takes medication If symptoms get worse could perform lower extremity arterial duplex  Mixed hyperlipidemia Encouraged her to stay on her Lipitor 40 mg daily for now Unable to exclude myalgias but symptoms more consistent with chronic pain issues  Poorly controlled type 2 diabetes mellitus with complication ([place]) Dietary guide provided Long discussion concerning the desperate need to get her sugars down  Morbid obesity ([place]) We have encouraged continued exercise, careful diet management in an effort to lose weight.  Smoker /tobacco use   long discussion with her concerning her need to quit smoking   we have discussed various modalities with her Education material provided Recommended Jennifer [name] attempt to contact. Smoking agencies for free patches   Total encounter time more than 25 minutes  Greater than 50% was spent in counseling and coordination of care with the patient   Disposition:   F/U  6 months   Orders Placed This Encounter  Procedures  . EKG 12-Lead     Signed, Esmond Plants, M.D., Ph.D. 05/02/2016  [place], [place] ]

## 2016-05-20 ENCOUNTER — Emergency Department: Payer: Medicare Other

## 2016-05-20 ENCOUNTER — Encounter: Payer: Self-pay | Admitting: *Deleted

## 2016-05-20 ENCOUNTER — Emergency Department
Admission: EM | Admit: 2016-05-20 | Discharge: 2016-05-21 | Disposition: A | Discharge status: Home / Self Care | Payer: Medicare Other | Attending: Emergency Medicine | Admitting: Emergency Medicine

## 2016-05-20 DIAGNOSIS — R109 Unspecified abdominal pain: Secondary | ICD-10-CM | POA: Diagnosis present

## 2016-05-20 DIAGNOSIS — Z7982 Long term (current) use of aspirin: Secondary | ICD-10-CM | POA: Diagnosis not present

## 2016-05-20 DIAGNOSIS — I252 Old myocardial infarction: Secondary | ICD-10-CM | POA: Diagnosis not present

## 2016-05-20 DIAGNOSIS — R1011 Right upper quadrant pain: Secondary | ICD-10-CM | POA: Insufficient documentation

## 2016-05-20 DIAGNOSIS — E119 Type 2 diabetes mellitus without complications: Secondary | ICD-10-CM | POA: Insufficient documentation

## 2016-05-20 DIAGNOSIS — Z79899 Other long term (current) drug therapy: Secondary | ICD-10-CM | POA: Insufficient documentation

## 2016-05-20 DIAGNOSIS — I2510 Atherosclerotic heart disease of native coronary artery without angina pectoris: Secondary | ICD-10-CM | POA: Diagnosis not present

## 2016-05-20 DIAGNOSIS — F17210 Nicotine dependence, cigarettes, uncomplicated: Secondary | ICD-10-CM | POA: Diagnosis not present

## 2016-05-20 DIAGNOSIS — R112 Nausea with vomiting, unspecified: Secondary | ICD-10-CM | POA: Diagnosis not present

## 2016-05-20 DIAGNOSIS — I10 Essential (primary) hypertension: Secondary | ICD-10-CM | POA: Diagnosis not present

## 2016-05-20 LAB — BASIC METABOLIC PANEL
Anion gap: 7 (ref 5–15)
BUN: 21 mg/dL — AB (ref 6–20)
CALCIUM: 9.4 mg/dL (ref 8.9–10.3)
CO2: 27 mmol/L (ref 22–32)
Chloride: 100 mmol/L — ABNORMAL LOW (ref 101–111)
Creatinine, Ser: 0.75 mg/dL (ref 0.44–1.00)
GFR calc Af Amer: >60 mL/min (ref >60)
GLUCOSE: 405 mg/dL — AB (ref 65–99)
Potassium: 4.7 mmol/L (ref 3.5–5.1)
Sodium: 134 mmol/L — ABNORMAL LOW (ref 135–145)

## 2016-05-20 LAB — HEPATIC FUNCTION PANEL
ALBUMIN: 4.3 g/dL (ref 3.5–5.0)
ALT: 16 U/L (ref 14–54)
AST: 17 U/L (ref 15–41)
Alkaline Phosphatase: 87 U/L (ref 38–126)
BILIRUBIN TOTAL: 0.5 mg/dL (ref 0.3–1.2)
Bilirubin, Direct: <0.1 mg/dL — ABNORMAL LOW (ref 0.1–0.5)
TOTAL PROTEIN: 7.4 g/dL (ref 6.5–8.1)

## 2016-05-20 LAB — GLUCOSE, CAPILLARY: Glucose-Capillary: 350 mg/dL — ABNORMAL HIGH (ref 65–99)

## 2016-05-20 LAB — URINALYSIS, COMPLETE (UACMP) WITH MICROSCOPIC
BILIRUBIN URINE: NEGATIVE
HGB URINE DIPSTICK: NEGATIVE
KETONES UR: NEGATIVE mg/dL
Leukocytes, UA: NEGATIVE
NITRITE: NEGATIVE
PROTEIN: NEGATIVE mg/dL
Specific Gravity, Urine: 1.027 (ref 1.005–1.030)
pH: 5 (ref 5.0–8.0)

## 2016-05-20 LAB — CBC
HCT: 45.8 % (ref 35.0–47.0)
Hemoglobin: 15 g/dL (ref 12.0–16.0)
MCH: 30.5 pg (ref 26.0–34.0)
MCHC: 32.7 g/dL (ref 32.0–36.0)
MCV: 93.2 fL (ref 80.0–100.0)
Platelets: 276 10*3/uL (ref 150–440)
RBC: 4.92 MIL/uL (ref 3.80–5.20)
RDW: 14 % (ref 11.5–14.5)
WBC: 12.3 10*3/uL — ABNORMAL HIGH (ref 3.6–11.0)

## 2016-05-20 LAB — FIBRIN DERIVATIVES D-DIMER (ARMC ONLY): FIBRIN DERIVATIVES D-DIMER (ARMC): 396 (ref 0–499)

## 2016-05-20 LAB — LIPASE, BLOOD: Lipase: 30 U/L (ref 11–51)

## 2016-05-20 MED ORDER — MORPHINE SULFATE (PF) 4 MG/ML IV SOLN
4.0000 mg | Freq: Once | INTRAVENOUS | Status: AC
  Administered 2016-05-20: 4 mg via INTRAVENOUS
  Filled 2016-05-20: qty 1

## 2016-05-20 MED ORDER — ONDANSETRON HCL 4 MG/2ML IJ SOLN
4.0000 mg | Freq: Once | INTRAMUSCULAR | Status: AC
  Administered 2016-05-20: 4 mg via INTRAVENOUS
  Filled 2016-05-20: qty 2

## 2016-05-20 MED ORDER — INSULIN ASPART 100 UNIT/ML ~~LOC~~ SOLN: 10.0000 [IU] | Freq: Once | SUBCUTANEOUS | Status: DC

## 2016-05-20 MED ORDER — SODIUM CHLORIDE 0.9 % IV BOLUS (SEPSIS)
1000.0000 mL | Freq: Once | INTRAVENOUS | Status: AC
  Administered 2016-05-20: 1000 mL via INTRAVENOUS

## 2016-05-20 NOTE — ED Triage Notes (Signed)
Pt has right flank pain    Sx for 3 days.  Hx of kidney stones.  No back injury.  Pt reports nausea.  Pt alert.  Speech clear.

## 2016-05-20 NOTE — ED Provider Notes (Signed)
[facility] Emergency Department Provider Note  ____________________________________________  Time seen: Approximately 10:20 PM  I have reviewed the triage vital signs and the nursing notes.   HISTORY  Chief Complaint Flank Pain   HPI Jennifer [name] is a 56 y.o. female h/o kidney stones, CAD, HTN, DM, bipolar, smoking who presents for evaluation of right flank pain. Patient reports 3 days of intermittent sharp severe right flank pain nonradiating, currently 10/10. She reports that pain sometimes is exacerbated by movement but not all the time.  Patient denies shortness of breath or chest pain, abdominal pain, dysuria, hematuria. She reports that he feels like her prior kidney stones. She endorses decreased urine production within the last 24 hours. No fever or chills. She has had nausea and a few episodes of nonbloody nonbilious emesis. She reports that the pain is pleuritic however she denies personal or family history of blood clots, recent travel or immobilization, leg pain or swelling, exogenous hormones, or hemoptysis.  Past Medical History:  Diagnosis Date  . Anxiety   . Arthritis    "qwhere" (06/14/2014)  . Bipolar disorder ([place])   . CAD (coronary artery disease)    a. cardiac cath: 2010: 95% mRCA s/p PCI/DES, 60% RPDA; b. cath 05/2014, widely patent LM, mild to mod dz in LAD and LCx, 90% dRCA s/p PCI/DES; c. cath 02/2015: severe ISR proximal segement of distal RCA stent s/p PCI/DES, nl EF by nuc 11/16, mildly elevated LVEDP   . Chronic bronchitis ([place])    "get it q yr"  . Daily headache    "when my blood pressure is up" (06/14/2014)  . Depression   . GERD (gastroesophageal reflux disease)   . Heart murmur   . High cholesterol   . History of stomach ulcers   . Hypertension   . Insomnia   . Kidney stones   . Kidney stones   . Migraine    "haven't had them in a good while; did get them 1-2 times/yr" (06/14/2014)  . Myocardial infarction 2008; ~ 2012   . Pneumonia    "get it 1-2 times/year" (06/14/2014)  . RLS (restless legs syndrome)   . Seizures ([place])   . Sleep apnea    "they want me to do the lab but I haven't" (06/14/2014)  . Stroke [facility])    "they say I've had several" (06/14/2014)  . Type II diabetes mellitus ([place])   . Urinary, incontinence, stress female     Patient Active Problem List   Diagnosis Date Noted  . Morbid obesity ([name]) 05/02/2016  . Chest pain with moderate risk for cardiac etiology 03/14/2015  . CAD (coronary artery disease)   . Hospital discharge follow-up 03/07/2015  . Poorly controlled type 2 diabetes mellitus with complication ([place]) 50/27/7412  . Hyperlipidemia 06/22/2014  . Stented coronary artery   . Chronic pain syndrome   . Chest pain at rest 06/14/2014  . Acute bronchitis 06/14/2014  . Tobacco abuse 06/14/2014  . Essential hypertension 06/14/2014  . GERD (gastroesophageal reflux disease) 06/14/2014  . Bipolar disorder ([place]) 06/14/2014  . Chest pain 06/14/2014    Past Surgical History:  Procedure Laterality Date  . ABDOMINAL HYSTERECTOMY  1984   "I've got 1 ovary left"  . CARDIAC CATHETERIZATION N/A 02/26/2015   Procedure: Left Heart Cath and Coronary Angiography;  Surgeon: Wellington Hampshire, MD;  Location: [place] [facility];  Service: Cardiovascular;  Laterality: N/A;  . CARDIAC CATHETERIZATION N/A 02/26/2015   Procedure: Coronary Stent Intervention;  Surgeon: Wellington Hampshire, MD;  Location: [place] [facility];  Service: Cardiovascular;  Laterality: N/A;  . CESAREAN SECTION  1984  . CORONARY ANGIOPLASTY WITH STENT PLACEMENT  2008; ~ 2012   "1; 1"  . DILATION AND CURETTAGE OF UTERUS    . EXTRACORPOREAL SHOCK WAVE LITHOTRIPSY  X 2  . KNEE ARTHROSCOPY Left   . LEFT HEART CATHETERIZATION WITH CORONARY ANGIOGRAM N/A 06/15/2014   Procedure: LEFT HEART CATHETERIZATION WITH CORONARY ANGIOGRAM;  Surgeon: Jettie Booze, MD;  Location: [facility] C[facility] LAB;  Service: Cardiovascular;  Laterality: N/A;    . PERCUTANEOUS CORONARY STENT INTERVENTION (PCI-S)  06/15/2014   Procedure: PERCUTANEOUS CORONARY STENT INTERVENTION (PCI-S);  Surgeon: Jettie Booze, MD;  Location: [facility] CATH LAB;  Service: Cardiovascular;;  . TONSILLECTOMY    . TUBAL LIGATION      Prior to Admission medications   Medication Sig Start Date End Date Taking? Authorizing Provider  amLODipine (NORVASC) 2.5 MG tablet Take 1 tablet (2.5 mg total) by mouth daily. 07/12/15   Minna Merritts, MD  aspirin EC 81 MG EC tablet Take 1 tablet (81 mg total) by mouth daily. 02/27/15   Gladstone Lighter, MD  atorvastatin (LIPITOR) 40 MG tablet take 1 tablet by mouth once daily AT 6PM AS DIRECTED 04/28/16   Minna Merritts, MD  Blood Glucose Monitoring Suppl W/DEVICE KIT Test 1-2 times daily; E11.65 diagnosis 03/02/15   Rubbie Battiest, NP  clopidogrel (PLAVIX) 75 MG tablet Take 1 tablet (75 mg total) by mouth daily with breakfast. 07/12/15   Minna Merritts, MD  COMBIVENT RESPIMAT 20-100 MCG/ACT AERS respimat Inhale 1 puff into the lungs every 6 (six) hours as needed for wheezing or shortness of breath. 02/27/15   Gladstone Lighter, MD  furosemide (LASIX) 20 MG tablet take 1 tablet by mouth once daily 04/23/16   Rise Mu, PA-C  gabapentin (NEURONTIN) 300 MG capsule Take 3 capsules (900 mg total) by mouth 3 (three) times daily. 02/27/15   Gladstone Lighter, MD  ibuprofen (ADVIL,MOTRIN) 800 MG tablet Take 800 mg by mouth every 6 (six) hours as needed.    Historical Provider, MD  lisinopril (PRINIVIL,ZESTRIL) 40 MG tablet Take 1 tablet (40 mg total) by mouth daily. 07/12/15   Minna Merritts, MD  metoprolol (TOPROL-XL) 200 MG 24 hr tablet take 1 tablet by mouth once daily WITH OR IMMEDIATELY FOLLOWING A MEAL 04/28/16   Minna Merritts, MD  Multiple Vitamins-Minerals (MULTIVITAMIN WITH MINERALS) tablet Take 1 tablet by mouth daily.    Historical Provider, MD  nicotine (NICODERM CQ - DOSED IN MG/24 HOURS) 21 mg/24hr patch Place 1 patch (21 mg total)  onto the skin daily. 11/28/15   Rise Mu, PA-C  oxyCODONE (OXY IR/ROXICODONE) 5 MG immediate release tablet Take 1 tablet (5 mg total) by mouth every 6 (six) hours as needed for moderate pain. 02/27/15   Gladstone Lighter, MD    Allergies Lithium; Tegretol [carbamazepine]; Levaquin [levofloxacin in d5w]; and Lodine [etodolac]  Family History  Problem Relation Age of Onset  . Family history unknown: Yes    Social History Social History  Substance Use Topics  . Smoking status: Current Every Day Smoker    Packs/day: 1.00    Years: 35.00    Types: Cigarettes  . Smokeless tobacco: Never Used     Comment: Patches  . Alcohol use 0.0 oz/week     Comment: 06/14/2014 "might drink twice/yr"    Review of Systems  Constitutional: Negative  for fever. Eyes: Negative for visual changes. ENT: Negative for sore throat. Neck: No neck pain  Cardiovascular: Negative for chest pain. Respiratory: Negative for shortness of breath. Gastrointestinal: Negative for abdominal pain, vomiting or diarrhea. Genitourinary: Negative for dysuria. + R flank pain Musculoskeletal: Negative for back pain. Skin: Negative for rash. Neurological: Negative for headaches, weakness or numbness. Psych: No SI or HI  ____________________________________________   PHYSICAL EXAM:  VITAL SIGNS: ED Triage Vitals [05/20/16 1927]  Enc Vitals Group     BP (!) 142/97     Pulse Rate 89     Resp 20     Temp 98.5 F (36.9 C)     Temp Source Oral     SpO2 96 %     Weight 190 lb (86.2 kg)     Height _0  (1.575 m)     Head Circumference      Peak Flow      Pain Score 8     Pain Loc      Pain Edu?      Excl. in [place]?     Constitutional: Alert and oriented. Well appearing and in no apparent distress. HEENT:      Head: Normocephalic and atraumatic.         Eyes: Conjunctivae are normal. Sclera is non-icteric. EOMI. PERRL      Mouth/Throat: Mucous membranes are moist.       Neck: Supple with no signs of  meningismus. Cardiovascular: Regular rate and rhythm. No murmurs, gallops, or rubs. 2+ symmetrical distal pulses are present in all extremities. No JVD. Respiratory: Normal respiratory effort. Lungs are clear to auscultation bilaterally. No wheezes, crackles, or rhonchi.  Gastrointestinal: Soft, ttp over the RUQ, and non distended with positive bowel sounds. No rebound or guarding. Genitourinary: Mild R CVA tenderness. Musculoskeletal: Nontender with normal range of motion in all extremities. No edema, cyanosis, or erythema of extremities. Neurologic: Normal speech and language. Face is symmetric. Moving all extremities. No gross focal neurologic deficits are appreciated. Skin: Skin is warm, dry and intact. No rash noted. Psychiatric: Mood and affect are normal. Speech and behavior are normal.  ____________________________________________   LABS (all labs ordered are listed, but only abnormal results are displayed)  Labs Reviewed  URINALYSIS, COMPLETE (UACMP) WITH MICROSCOPIC - Abnormal; Notable for the following:       Result Value   Color, Urine YELLOW (*)    APPearance HAZY (*)    Glucose, UA >=500 (*)    Bacteria, UA RARE (*)    Squamous Epithelial / LPF 6-30 (*)    All other components within normal limits  BASIC METABOLIC PANEL - Abnormal; Notable for the following:    Sodium 134 (*)    Chloride 100 (*)    Glucose, Bld 405 (*)    BUN 21 (*)    All other components within normal limits  CBC - Abnormal; Notable for the following:    WBC 12.3 (*)    All other components within normal limits  HEPATIC FUNCTION PANEL - Abnormal; Notable for the following:    Bilirubin, Direct <0.1 (*)    All other components within normal limits  LIPASE, BLOOD  CBG MONITORING, ED   ____________________________________________  EKG  none ____________________________________________  RADIOLOGY  CT renal:  1. No obstructive uropathy or hydronephrosis. 2. Punctate left kidney interpolar  nonobstructing caliceal stone. 3. Bilateral adrenal adenomas are stable. 4. Aortic atherosclerosis. 5. Scattered sigmoid diverticulosis without evidence for diverticulitis. 6. Stable small left  inguinal hernia containing fat. ____________________________________________   PROCEDURES  Procedure(s) performed: None Procedures Critical Care performed:  None ____________________________________________   INITIAL IMPRESSION / ASSESSMENT AND PLAN / ED COURSE   56 y.o. female h/o kidney stones, CAD, HTN, DM, bipolar, smoking who presents for evaluation of 3 days of intermittent sharp right flank pain associated with nausea and vomiting. Patient is well-appearing, in no distress, is normal vital signs, she has tenderness to palpation in the right upper quadrant and mild tenderness palpation on the right flank. Differential diagnoses including kidney stone, pyelonephritis, gallbladder pathology. PE also possibly with pleuritic pain although thought to be less likely since the pain has been intermittent. Will give morphine, IVF, check labs, UA, CT renal.  Clinical Course as of May 20 2257  Tue May 20, 2016  2257 CT with no acute findings. LFT and lipase WNL. D-dimer sent to eval for PE. Vitals remain stable. Will repeat BG after 1L NS. Plan for CTA if d-dimer elevated otherwise dc home with f/u with PCP. Plan discussed with patient. Care transferred to Dr. Owens Shark.  [CV]    Clinical Course User Index [CV] Rudene Re, MD    Pertinent labs & imaging results that were available during my care of the patient were reviewed by me and considered in my medical decision making (see chart for details).    ____________________________________________   FINAL CLINICAL IMPRESSION(S) / ED DIAGNOSES  Final diagnoses:  Right flank pain      NEW MEDICATIONS STARTED DURING THIS VISIT:  New Prescriptions   No medications on file     Note:  This document was prepared using Dragon voice  recognition software and may include unintentional dictation errors.    Rudene Re, MD 05/20/16 2259

## 2016-05-21 DIAGNOSIS — R1011 Right upper quadrant pain: Secondary | ICD-10-CM | POA: Diagnosis not present

## 2016-05-21 NOTE — ED Provider Notes (Signed)
I simply the patient from Dr. Merdis DelayBernice 11:00 PM with d-dimer pending which was normal ([phone]) . As such CT scan was not performed. Patient is referred to her primary care provider for further outpatient of right flank pain.   Darci Currentandolph N Alleya Demeter, MD 05/21/16 [id number]

## 2016-07-17 ENCOUNTER — Other Ambulatory Visit: Payer: Self-pay | Admitting: Cardiovascular Disease

## 2016-08-01 ENCOUNTER — Emergency Department: Payer: Medicare Other

## 2016-08-01 ENCOUNTER — Encounter: Payer: Self-pay | Admitting: Emergency Medicine

## 2016-08-01 DIAGNOSIS — Y832 Surgical operation with anastomosis, bypass or graft as the cause of abnormal reaction of the patient, or of later complication, without mention of misadventure at the time of the procedure: Secondary | ICD-10-CM | POA: Diagnosis present

## 2016-08-01 DIAGNOSIS — E1165 Type 2 diabetes mellitus with hyperglycemia: Secondary | ICD-10-CM | POA: Diagnosis present

## 2016-08-01 DIAGNOSIS — G4730 Sleep apnea, unspecified: Secondary | ICD-10-CM | POA: Diagnosis present

## 2016-08-01 DIAGNOSIS — I10 Essential (primary) hypertension: Secondary | ICD-10-CM | POA: Diagnosis present

## 2016-08-01 DIAGNOSIS — E1159 Type 2 diabetes mellitus with other circulatory complications: Secondary | ICD-10-CM | POA: Diagnosis present

## 2016-08-01 DIAGNOSIS — I252 Old myocardial infarction: Secondary | ICD-10-CM

## 2016-08-01 DIAGNOSIS — F17210 Nicotine dependence, cigarettes, uncomplicated: Secondary | ICD-10-CM | POA: Diagnosis present

## 2016-08-01 DIAGNOSIS — Z8673 Personal history of transient ischemic attack (TIA), and cerebral infarction without residual deficits: Secondary | ICD-10-CM

## 2016-08-01 DIAGNOSIS — G2581 Restless legs syndrome: Secondary | ICD-10-CM | POA: Diagnosis present

## 2016-08-01 DIAGNOSIS — E7800 Pure hypercholesterolemia, unspecified: Secondary | ICD-10-CM | POA: Diagnosis present

## 2016-08-01 DIAGNOSIS — Z7982 Long term (current) use of aspirin: Secondary | ICD-10-CM

## 2016-08-01 DIAGNOSIS — Z955 Presence of coronary angioplasty implant and graft: Secondary | ICD-10-CM

## 2016-08-01 DIAGNOSIS — Z9111 Patient's noncompliance with dietary regimen: Secondary | ICD-10-CM

## 2016-08-01 DIAGNOSIS — Z6837 Body mass index (BMI) 37.0-37.9, adult: Secondary | ICD-10-CM

## 2016-08-01 DIAGNOSIS — I25110 Atherosclerotic heart disease of native coronary artery with unstable angina pectoris: Secondary | ICD-10-CM | POA: Diagnosis present

## 2016-08-01 DIAGNOSIS — E669 Obesity, unspecified: Secondary | ICD-10-CM | POA: Diagnosis present

## 2016-08-01 DIAGNOSIS — Z9114 Patient's other noncompliance with medication regimen: Secondary | ICD-10-CM

## 2016-08-01 DIAGNOSIS — G894 Chronic pain syndrome: Secondary | ICD-10-CM | POA: Diagnosis present

## 2016-08-01 DIAGNOSIS — E041 Nontoxic single thyroid nodule: Secondary | ICD-10-CM | POA: Diagnosis present

## 2016-08-01 DIAGNOSIS — F319 Bipolar disorder, unspecified: Secondary | ICD-10-CM | POA: Diagnosis present

## 2016-08-01 DIAGNOSIS — T82858A Stenosis of vascular prosthetic devices, implants and grafts, initial encounter: Secondary | ICD-10-CM | POA: Diagnosis not present

## 2016-08-01 DIAGNOSIS — K219 Gastro-esophageal reflux disease without esophagitis: Secondary | ICD-10-CM | POA: Diagnosis present

## 2016-08-01 DIAGNOSIS — R079 Chest pain, unspecified: Secondary | ICD-10-CM | POA: Diagnosis not present

## 2016-08-01 DIAGNOSIS — Z7902 Long term (current) use of antithrombotics/antiplatelets: Secondary | ICD-10-CM

## 2016-08-01 DIAGNOSIS — Z90710 Acquired absence of both cervix and uterus: Secondary | ICD-10-CM

## 2016-08-01 DIAGNOSIS — E785 Hyperlipidemia, unspecified: Secondary | ICD-10-CM | POA: Diagnosis present

## 2016-08-01 LAB — BASIC METABOLIC PANEL
Anion gap: 8 (ref 5–15)
BUN: 18 mg/dL (ref 6–20)
CHLORIDE: 106 mmol/L (ref 101–111)
CO2: 22 mmol/L (ref 22–32)
CREATININE: 0.68 mg/dL (ref 0.44–1.00)
Calcium: 9.6 mg/dL (ref 8.9–10.3)
GFR calc non Af Amer: >60 mL/min (ref >60)
Glucose, Bld: 496 mg/dL — ABNORMAL HIGH (ref 65–99)
POTASSIUM: 3.7 mmol/L (ref 3.5–5.1)
SODIUM: 136 mmol/L (ref 135–145)

## 2016-08-01 LAB — TROPONIN I: Troponin I: <0.03 ng/mL (ref <0.03)

## 2016-08-01 LAB — CBC
HEMATOCRIT: 42.3 % (ref 35.0–47.0)
HEMOGLOBIN: 14.1 g/dL (ref 12.0–16.0)
MCH: 31.2 pg (ref 26.0–34.0)
MCHC: 33.3 g/dL (ref 32.0–36.0)
MCV: 93.6 fL (ref 80.0–100.0)
Platelets: 272 10*3/uL (ref 150–440)
RBC: 4.52 MIL/uL (ref 3.80–5.20)
RDW: 14.1 % (ref 11.5–14.5)
WBC: 10.9 10*3/uL (ref 3.6–11.0)

## 2016-08-01 LAB — PROTIME-INR
INR: 0.9
Prothrombin Time: 12.1 seconds (ref 11.4–15.2)

## 2016-08-01 NOTE — ED Triage Notes (Signed)
Pt states that she experienced chest pain around 2040. Pt states that she has had nausea without emesis at this time. Pt has hx of cardiac issues and reports x4 previous MI's. Pt is talkative in triage using complete sentences without difficulty.

## 2016-08-02 ENCOUNTER — Observation Stay (HOSPITAL_BASED_OUTPATIENT_CLINIC_OR_DEPARTMENT_OTHER)
Admit: 2016-08-02 | Discharge: 2016-08-02 | Disposition: A | Discharge status: Home / Self Care | Payer: Medicare Other | Attending: Internal Medicine | Admitting: Internal Medicine

## 2016-08-02 ENCOUNTER — Inpatient Hospital Stay
Admission: EM | Admit: 2016-08-02 | Discharge: 2016-08-05 | DRG: 247 | Disposition: A | Discharge status: Home / Self Care | Payer: Medicare Other | Attending: Internal Medicine | Admitting: Internal Medicine

## 2016-08-02 DIAGNOSIS — R9431 Abnormal electrocardiogram [ECG] [EKG]: Secondary | ICD-10-CM | POA: Diagnosis not present

## 2016-08-02 DIAGNOSIS — I10 Essential (primary) hypertension: Secondary | ICD-10-CM

## 2016-08-02 DIAGNOSIS — I200 Unstable angina: Secondary | ICD-10-CM | POA: Diagnosis not present

## 2016-08-02 DIAGNOSIS — E1159 Type 2 diabetes mellitus with other circulatory complications: Secondary | ICD-10-CM

## 2016-08-02 DIAGNOSIS — R079 Chest pain, unspecified: Secondary | ICD-10-CM

## 2016-08-02 DIAGNOSIS — F17200 Nicotine dependence, unspecified, uncomplicated: Secondary | ICD-10-CM | POA: Diagnosis not present

## 2016-08-02 DIAGNOSIS — E1165 Type 2 diabetes mellitus with hyperglycemia: Secondary | ICD-10-CM | POA: Diagnosis not present

## 2016-08-02 DIAGNOSIS — R739 Hyperglycemia, unspecified: Secondary | ICD-10-CM

## 2016-08-02 LAB — CREATININE, SERUM
CREATININE: 0.56 mg/dL (ref 0.44–1.00)
GFR calc Af Amer: >60 mL/min (ref >60)
GFR calc non Af Amer: >60 mL/min (ref >60)

## 2016-08-02 LAB — BASIC METABOLIC PANEL
Anion gap: 7 (ref 5–15)
BUN: 15 mg/dL (ref 6–20)
CHLORIDE: 106 mmol/L (ref 101–111)
CO2: 23 mmol/L (ref 22–32)
CREATININE: 0.31 mg/dL — AB (ref 0.44–1.00)
Calcium: 8.5 mg/dL — ABNORMAL LOW (ref 8.9–10.3)
GFR calc Af Amer: >60 mL/min (ref >60)
GFR calc non Af Amer: >60 mL/min (ref >60)
Glucose, Bld: 271 mg/dL — ABNORMAL HIGH (ref 65–99)
Potassium: 3.6 mmol/L (ref 3.5–5.1)
SODIUM: 136 mmol/L (ref 135–145)

## 2016-08-02 LAB — LIPID PANEL
CHOL/HDL RATIO: 4 ratio
CHOLESTEROL: 140 mg/dL (ref 0–200)
HDL: 35 mg/dL — ABNORMAL LOW (ref >40)
LDL CALC: 71 mg/dL (ref 0–99)
TRIGLYCERIDES: 172 mg/dL — AB (ref <150)
VLDL: 34 mg/dL (ref 0–40)

## 2016-08-02 LAB — CBC
HCT: 38.5 % (ref 35.0–47.0)
Hemoglobin: 13.1 g/dL (ref 12.0–16.0)
MCH: 31 pg (ref 26.0–34.0)
MCHC: 34 g/dL (ref 32.0–36.0)
MCV: 91.2 fL (ref 80.0–100.0)
PLATELETS: 238 10*3/uL (ref 150–440)
RBC: 4.22 MIL/uL (ref 3.80–5.20)
RDW: 13.6 % (ref 11.5–14.5)
WBC: 10.5 10*3/uL (ref 3.6–11.0)

## 2016-08-02 LAB — GLUCOSE, CAPILLARY
GLUCOSE-CAPILLARY: 346 mg/dL — AB (ref 65–99)
GLUCOSE-CAPILLARY: 237 mg/dL — AB (ref 65–99)
GLUCOSE-CAPILLARY: 195 mg/dL — AB (ref 65–99)
Glucose-Capillary: 304 mg/dL — ABNORMAL HIGH (ref 65–99)
Glucose-Capillary: 284 mg/dL — ABNORMAL HIGH (ref 65–99)

## 2016-08-02 LAB — HEPARIN LEVEL (UNFRACTIONATED)
HEPARIN UNFRACTIONATED: 0.33 [IU]/mL (ref 0.30–0.70)
HEPARIN UNFRACTIONATED: 0.19 [IU]/mL — AB (ref 0.30–0.70)
Heparin Unfractionated: 0.4 IU/mL (ref 0.30–0.70)

## 2016-08-02 LAB — TROPONIN I
TROPONIN I: 0.1 ng/mL — AB (ref <0.03)
TROPONIN I: 0.1 ng/mL — AB (ref <0.03)
Troponin I: 0.09 ng/mL (ref <0.03)

## 2016-08-02 LAB — ECHOCARDIOGRAM COMPLETE

## 2016-08-02 LAB — APTT: aPTT: 45 seconds — ABNORMAL HIGH (ref 24–36)

## 2016-08-02 MED ORDER — NITROGLYCERIN IN D5W 200-5 MCG/ML-% IV SOLN
0.0000 ug/min | INTRAVENOUS | Status: DC
  Administered 2016-08-02: 5 ug/min via INTRAVENOUS
  Administered 2016-08-03: 20 ug/min via INTRAVENOUS
  Administered 2016-08-03: 15 ug/min via INTRAVENOUS
  Administered 2016-08-03: 25 ug/min via INTRAVENOUS
  Administered 2016-08-03: 30 ug/min via INTRAVENOUS
  Administered 2016-08-03: 10 ug/min via INTRAVENOUS
  Administered 2016-08-03 – 2016-08-04 (×2): 35 ug/min via INTRAVENOUS
  Administered 2016-08-04: 45 ug/min via INTRAVENOUS
  Filled 2016-08-02 (×2): qty 250

## 2016-08-02 MED ORDER — NITROGLYCERIN 0.4 MG SL SUBL
0.4000 mg | SUBLINGUAL_TABLET | SUBLINGUAL | Status: DC | PRN
  Administered 2016-08-02 – 2016-08-04 (×12): 0.4 mg via SUBLINGUAL
  Filled 2016-08-02 (×9): qty 1

## 2016-08-02 MED ORDER — HEPARIN BOLUS VIA INFUSION
2100.0000 [IU] | Freq: Once | INTRAVENOUS | Status: AC
  Administered 2016-08-02: 2100 [IU] via INTRAVENOUS
  Filled 2016-08-02: qty 2100

## 2016-08-02 MED ORDER — ASPIRIN EC 81 MG PO TBEC
81.0000 mg | DELAYED_RELEASE_TABLET | Freq: Every day | ORAL | Status: DC
  Administered 2016-08-02 – 2016-08-05 (×3): 81 mg via ORAL
  Filled 2016-08-02 (×2): qty 1

## 2016-08-02 MED ORDER — INSULIN ASPART 100 UNIT/ML ~~LOC~~ SOLN
0.0000 [IU] | Freq: Every day | SUBCUTANEOUS | Status: DC
  Administered 2016-08-02 – 2016-08-03 (×2): 3 [IU] via SUBCUTANEOUS
  Administered 2016-08-04: 4 [IU] via SUBCUTANEOUS
  Filled 2016-08-02: qty 3
  Filled 2016-08-02: qty 4
  Filled 2016-08-02: qty 3

## 2016-08-02 MED ORDER — ASPIRIN EC 81 MG PO TBEC
81.0000 mg | DELAYED_RELEASE_TABLET | Freq: Every day | ORAL | Status: DC
  Filled 2016-08-02: qty 1

## 2016-08-02 MED ORDER — HEPARIN BOLUS VIA INFUSION
4000.0000 [IU] | Freq: Once | INTRAVENOUS | Status: AC
  Administered 2016-08-02: 4000 [IU] via INTRAVENOUS
  Filled 2016-08-02: qty 4000

## 2016-08-02 MED ORDER — LISINOPRIL 20 MG PO TABS
40.0000 mg | ORAL_TABLET | Freq: Every day | ORAL | Status: DC
  Administered 2016-08-02 – 2016-08-05 (×4): 40 mg via ORAL
  Filled 2016-08-02 (×4): qty 2

## 2016-08-02 MED ORDER — IPRATROPIUM-ALBUTEROL 0.5-2.5 (3) MG/3ML IN SOLN
3.0000 mL | Freq: Four times a day (QID) | RESPIRATORY_TRACT | Status: DC | PRN
  Administered 2016-08-03: 3 mL via RESPIRATORY_TRACT
  Filled 2016-08-02: qty 3

## 2016-08-02 MED ORDER — ASPIRIN 300 MG RE SUPP
300.0000 mg | RECTAL | Status: AC
Start: 2016-08-02 — End: 2016-08-03

## 2016-08-02 MED ORDER — INSULIN GLARGINE 100 UNIT/ML ~~LOC~~ SOLN
20.0000 [IU] | Freq: Every day | SUBCUTANEOUS | Status: DC
  Administered 2016-08-02 – 2016-08-05 (×3): 20 [IU] via SUBCUTANEOUS
  Filled 2016-08-02 (×4): qty 0.2

## 2016-08-02 MED ORDER — ATORVASTATIN CALCIUM 20 MG PO TABS
40.0000 mg | ORAL_TABLET | Freq: Every day | ORAL | Status: DC
  Administered 2016-08-02 – 2016-08-04 (×3): 40 mg via ORAL
  Filled 2016-08-02 (×3): qty 2

## 2016-08-02 MED ORDER — NITROGLYCERIN 0.4 MG SL SUBL: 0.4000 mg | SUBLINGUAL_TABLET | SUBLINGUAL | Status: DC | PRN

## 2016-08-02 MED ORDER — ONDANSETRON HCL 4 MG/2ML IJ SOLN
4.0000 mg | Freq: Four times a day (QID) | INTRAMUSCULAR | Status: DC | PRN
  Administered 2016-08-02 (×2): 4 mg via INTRAVENOUS
  Filled 2016-08-02 (×3): qty 2

## 2016-08-02 MED ORDER — INSULIN ASPART 100 UNIT/ML ~~LOC~~ SOLN
8.0000 [IU] | Freq: Once | SUBCUTANEOUS | Status: AC
  Administered 2016-08-02: 8 [IU] via INTRAVENOUS
  Filled 2016-08-02: qty 8

## 2016-08-02 MED ORDER — CLOPIDOGREL BISULFATE 75 MG PO TABS
75.0000 mg | ORAL_TABLET | Freq: Every day | ORAL | Status: DC
  Administered 2016-08-02 – 2016-08-05 (×3): 75 mg via ORAL
  Filled 2016-08-02 (×3): qty 1

## 2016-08-02 MED ORDER — INSULIN ASPART 100 UNIT/ML ~~LOC~~ SOLN
4.0000 [IU] | Freq: Three times a day (TID) | SUBCUTANEOUS | Status: DC
  Administered 2016-08-02 – 2016-08-05 (×7): 4 [IU] via SUBCUTANEOUS
  Filled 2016-08-02 (×7): qty 4

## 2016-08-02 MED ORDER — GABAPENTIN 300 MG PO CAPS
900.0000 mg | ORAL_CAPSULE | Freq: Three times a day (TID) | ORAL | Status: DC
  Administered 2016-08-02 – 2016-08-05 (×9): 900 mg via ORAL
  Filled 2016-08-02 (×9): qty 3

## 2016-08-02 MED ORDER — IPRATROPIUM-ALBUTEROL 20-100 MCG/ACT IN AERS: 1.0000 | INHALATION_SPRAY | Freq: Four times a day (QID) | RESPIRATORY_TRACT | Status: DC | PRN

## 2016-08-02 MED ORDER — INSULIN ASPART 100 UNIT/ML ~~LOC~~ SOLN
0.0000 [IU] | Freq: Three times a day (TID) | SUBCUTANEOUS | Status: DC
  Administered 2016-08-02: 3 [IU] via SUBCUTANEOUS
  Administered 2016-08-02 – 2016-08-03 (×4): 11 [IU] via SUBCUTANEOUS
  Administered 2016-08-03: 3 [IU] via SUBCUTANEOUS
  Administered 2016-08-04: 11 [IU] via SUBCUTANEOUS
  Administered 2016-08-05: 8 [IU] via SUBCUTANEOUS
  Administered 2016-08-05: 15 [IU] via SUBCUTANEOUS
  Filled 2016-08-02 (×2): qty 3
  Filled 2016-08-02: qty 11
  Filled 2016-08-02: qty 8
  Filled 2016-08-02 (×3): qty 11
  Filled 2016-08-02: qty 15
  Filled 2016-08-02: qty 11

## 2016-08-02 MED ORDER — OXYCODONE HCL 5 MG PO TABS
5.0000 mg | ORAL_TABLET | Freq: Four times a day (QID) | ORAL | Status: DC | PRN
  Administered 2016-08-02 (×3): 5 mg via ORAL
  Filled 2016-08-02 (×3): qty 1

## 2016-08-02 MED ORDER — ADULT MULTIVITAMIN W/MINERALS CH
1.0000 | ORAL_TABLET | Freq: Every day | ORAL | Status: DC
  Administered 2016-08-02 – 2016-08-05 (×4): 1 via ORAL
  Filled 2016-08-02 (×4): qty 1

## 2016-08-02 MED ORDER — NITROGLYCERIN 2 % TD OINT
1.0000 [in_us] | TOPICAL_OINTMENT | Freq: Once | TRANSDERMAL | Status: AC
  Administered 2016-08-02: 1 [in_us] via TOPICAL
  Filled 2016-08-02: qty 1

## 2016-08-02 MED ORDER — ASPIRIN 81 MG PO CHEW
324.0000 mg | CHEWABLE_TABLET | Freq: Once | ORAL | Status: AC
  Administered 2016-08-02: 324 mg via ORAL
  Filled 2016-08-02: qty 4

## 2016-08-02 MED ORDER — ACETAMINOPHEN 325 MG PO TABS
650.0000 mg | ORAL_TABLET | ORAL | Status: DC | PRN
  Filled 2016-08-02: qty 2

## 2016-08-02 MED ORDER — NICOTINE 21 MG/24HR TD PT24
21.0000 mg | MEDICATED_PATCH | Freq: Every day | TRANSDERMAL | Status: DC
  Filled 2016-08-02 (×3): qty 1

## 2016-08-02 MED ORDER — METOPROLOL SUCCINATE ER 100 MG PO TB24
200.0000 mg | ORAL_TABLET | Freq: Every day | ORAL | Status: DC
  Administered 2016-08-03 – 2016-08-05 (×3): 200 mg via ORAL
  Filled 2016-08-02 (×4): qty 2

## 2016-08-02 MED ORDER — CYCLOBENZAPRINE HCL 10 MG PO TABS
10.0000 mg | ORAL_TABLET | Freq: Three times a day (TID) | ORAL | Status: DC | PRN
  Administered 2016-08-02 – 2016-08-04 (×4): 10 mg via ORAL
  Filled 2016-08-02 (×4): qty 1

## 2016-08-02 MED ORDER — HEPARIN (PORCINE) IN NACL 100-0.45 UNIT/ML-% IJ SOLN
1100.0000 [IU]/h | INTRAMUSCULAR | Status: DC
  Administered 2016-08-02: 850 [IU]/h via INTRAVENOUS
  Administered 2016-08-04: 1100 [IU]/h via INTRAVENOUS
  Filled 2016-08-02 (×3): qty 250

## 2016-08-02 MED ORDER — IBUPROFEN 400 MG PO TABS
400.0000 mg | ORAL_TABLET | ORAL | Status: DC | PRN
  Administered 2016-08-02 – 2016-08-04 (×5): 400 mg via ORAL
  Filled 2016-08-02 (×5): qty 1

## 2016-08-02 MED ORDER — ASPIRIN 81 MG PO CHEW: 324.0000 mg | CHEWABLE_TABLET | ORAL | Status: AC

## 2016-08-02 NOTE — ED Notes (Signed)
Hospitalist at bedside 

## 2016-08-02 NOTE — Progress Notes (Signed)
ANTICOAGULATION CONSULT NOTE - Follow Up Consult  Pharmacy Consult for Heparin Drip Indication: chest pain/ACS  Allergies  Allergen Reactions  . Lithium Other (See Comments)    Blood dyscrasia  . Tegretol [Carbamazepine] Other (See Comments)    Blood dyscrasia when on with Lithium  . Levaquin [Levofloxacin In D5w] Other (See Comments)    "Salty" sensation in mouth. "Hard time to explain it"  . Lodine [Etodolac] Other (See Comments)    Rash, N/V    Patient Measurements: Height:  (157.5 cm) Weight: 190 lb (86.2 kg) IBW/kg (Calculated) : 50.1 Heparin Dosing Weight: 69.7 kg  Vital Signs: BP: 139/74 (04/14 1151) Pulse Rate: 90 (04/14 1151)  Labs:  Recent Labs  08/01/16 2059 08/02/16 0353 08/02/16 0532 08/02/16 0935 08/02/16 1207  HGB 14.1  --   --  13.1  --   HCT 42.3  --   --  38.5  --   PLT 272  --   --  238  --   APTT  --   --   --  45*  --   LABPROT 12.1  --   --   --   --   INR 0.90  --   --   --   --   HEPARINUNFRC  --   --   --  0.33 0.19*  CREATININE 0.68  --  0.31* 0.56  --   TROPONINI <0.03 0.09*  --  0.10* 0.10*    Estimated Creatinine Clearance: 82.8 mL/min (by C-G formula based on SCr of 0.56 mg/dL).  Assessment: Patient admitted w/ CP and trops 0.09 Being started on heparin drip  Goal of Therapy:  Heparin level 0.3-0.7 units/ml Monitor platelets by anticoagulation protocol: Yes   Plan:  Give 4000 units bolus x 1  Will start heparin drip @ 850 unit/hr and will check anti-Xa 4/14 @ 1200 Baseline labs ordered. Will continue to monitor CBC and signs/symptoms of bleeding.  4/14: HL@ 1207=0.19.  Will give Heparin bolus of 2100 units x 1 and increase drip to 1050 units/hr. Will recheck HL at 2000.   Hyun,Yuvraj Pfeifer A 08/02/2016,1:20 PM

## 2016-08-02 NOTE — ED Notes (Signed)
Patient c/o left chest pain described as heaviness, that radiates to bilateral arms, neck, jaw and back.  Patient has accompanying symptoms SOB, nausea, dizziness, diaphoresis and  weakness. Pt denies vomiting.

## 2016-08-02 NOTE — ED Provider Notes (Signed)
[facility] Emergency Department Provider Note   ____________________________________________   First MD Initiated Contact with Patient 08/02/16 0036     (approximate)  I have reviewed the triage vital signs and the nursing notes.   HISTORY  Chief Complaint Chest Pain    HPI Jennifer [name] is a 56 y.o. female who presents to the ED from home with a chief complaint of chest pain. Patient has a history of CAD status post multiple stents who had onset of waxing/waning left-sided chest pressure approximately 9 PM. Symptoms associated with nausea. Radiates to her left arm and jaw. Denies associated diaphoresis, shortness of breath, vomiting or palpitations. States this feels similarly but more intense than previous episodes of MI. Denies recent fever, chills, abdominal pain, vomiting, diarrhea. Denies recent travel, trauma or hormone use. Patient did not take anything prior to arrival.   Past Medical History:  Diagnosis Date  . Anxiety   . Arthritis    "qwhere" (06/14/2014)  . Bipolar disorder (HCC)   . CAD (coronary artery disease)    a. cardiac cath: 2010: 95% mRCA s/p PCI/DES, 60% RPDA; b. cath 05/2014, widely patent LM, mild to mod dz in LAD and LCx, 90% dRCA s/p PCI/DES; c. cath 02/2015: severe ISR proximal segement of distal RCA stent s/p PCI/DES, nl EF by nuc 11/16, mildly elevated LVEDP   . Chronic bronchitis (HCC)    "get it q yr"  . Daily headache    "when my blood pressure is up" (06/14/2014)  . Depression   . GERD (gastroesophageal reflux disease)   . Heart murmur   . High cholesterol   . History of stomach ulcers   . Hypertension   . Insomnia   . Kidney stones   . Kidney stones   . Migraine    "haven't had them in a good while; did get them 1-2 times/yr" (06/14/2014)  . Myocardial infarction (HCC) 2008; ~ 2012  . Pneumonia    "get it 1-2 times/year" (06/14/2014)  . RLS (restless legs syndrome)   . Seizures (HCC)   . Sleep apnea    "they  want me to do the lab but I haven't" (06/14/2014)  . Stroke [facility])    "they say I've had several" (06/14/2014)  . Type II diabetes mellitus (HCC)   . Urinary, incontinence, stress female     Patient Active Problem List   Diagnosis Date Noted  . Morbid obesity (HCC) 05/02/2016  . Chest pain with moderate risk for cardiac etiology 03/14/2015  . CAD (coronary artery disease)   . Hospital discharge follow-up 03/07/2015  . Poorly controlled type 2 diabetes mellitus with complication (HCC) 01/16/2015  . Hyperlipidemia 06/22/2014  . Stented coronary artery   . Chronic pain syndrome   . Chest pain at rest 06/14/2014  . Acute bronchitis 06/14/2014  . Tobacco abuse 06/14/2014  . Essential hypertension 06/14/2014  . GERD (gastroesophageal reflux disease) 06/14/2014  . Bipolar disorder (HCC) 06/14/2014  . Chest pain 06/14/2014    Past Surgical History:  Procedure Laterality Date  . ABDOMINAL HYSTERECTOMY  1984   "I've got 1 ovary left"  . CARDIAC CATHETERIZATION N/A 02/26/2015   Procedure: Left Heart Cath and Coronary Angiography;  Surgeon: Iran Ouch, MD;  Location: [facility] INVASIVE CV LAB;  Service: Cardiovascular;  Laterality: N/A;  . CARDIAC CATHETERIZATION N/A 02/26/2015   Procedure: Coronary Stent Intervention;  Surgeon: Iran Ouch, MD;  Location: [facility] INVASIVE CV LAB;  Service: Cardiovascular;  Laterality: N/A;  .  CESAREAN SECTION  1984  . CORONARY ANGIOPLASTY WITH STENT PLACEMENT  2008; ~ 2012   "1; 1"  . DILATION AND CURETTAGE OF UTERUS    . EXTRACORPOREAL SHOCK WAVE LITHOTRIPSY  X 2  . KNEE ARTHROSCOPY Left   . LEFT HEART CATHETERIZATION WITH CORONARY ANGIOGRAM N/A 06/15/2014   Procedure: LEFT HEART CATHETERIZATION WITH CORONARY ANGIOGRAM;  Surgeon: Jettie Booze, MD;  Location: [facility] CATH LAB;  Service: Cardiovascular;  Laterality: N/A;  . PERCUTANEOUS CORONARY STENT INTERVENTION (PCI-S)  06/15/2014   Procedure: PERCUTANEOUS CORONARY STENT INTERVENTION (PCI-S);   Surgeon: Jettie Booze, MD;  Location: [facility] CATH LAB;  Service: Cardiovascular;;  . TONSILLECTOMY    . TUBAL LIGATION      Prior to Admission medications   Medication Sig Start Date End Date Taking? Authorizing Provider  amLODipine (NORVASC) 2.5 MG tablet Take 1 tablet (2.5 mg total) by mouth daily. 07/12/15   Minna Merritts, MD  aspirin EC 81 MG EC tablet Take 1 tablet (81 mg total) by mouth daily. 02/27/15   Gladstone Lighter, MD  atorvastatin (LIPITOR) 40 MG tablet take 1 tablet by mouth once daily AT 6PM AS DIRECTED 04/28/16   Minna Merritts, MD  Blood Glucose Monitoring Suppl W/DEVICE KIT Test 1-2 times daily; E11.65 diagnosis 03/02/15   Rubbie Battiest, NP  clopidogrel (PLAVIX) 75 MG tablet Take 1 tablet (75 mg total) by mouth daily with breakfast. 07/12/15   Minna Merritts, MD  COMBIVENT RESPIMAT 20-100 MCG/ACT AERS respimat Inhale 1 puff into the lungs every 6 (six) hours as needed for wheezing or shortness of breath. 02/27/15   Gladstone Lighter, MD  furosemide (LASIX) 20 MG tablet take 1 tablet by mouth once daily 04/23/16   Rise Mu, PA-C  gabapentin (NEURONTIN) 300 MG capsule Take 3 capsules (900 mg total) by mouth 3 (three) times daily. 02/27/15   Gladstone Lighter, MD  ibuprofen (ADVIL,MOTRIN) 800 MG tablet Take 800 mg by mouth every 6 (six) hours as needed.    Historical Provider, MD  lisinopril (PRINIVIL,ZESTRIL) 40 MG tablet take 1 tablet by mouth once daily 07/18/16   Minna Merritts, MD  metoprolol (TOPROL-XL) 200 MG 24 hr tablet take 1 tablet by mouth once daily WITH OR IMMEDIATELY FOLLOWING A MEAL 04/28/16   Minna Merritts, MD  Multiple Vitamins-Minerals (MULTIVITAMIN WITH MINERALS) tablet Take 1 tablet by mouth daily.    Historical Provider, MD  nicotine (NICODERM CQ - DOSED IN MG/24 HOURS) 21 mg/24hr patch Place 1 patch (21 mg total) onto the skin daily. 11/28/15   Rise Mu, PA-C  oxyCODONE (OXY IR/ROXICODONE) 5 MG immediate release tablet Take 1 tablet (5 mg total) by  mouth every 6 (six) hours as needed for moderate pain. 02/27/15   Gladstone Lighter, MD    Allergies Lithium; Tegretol [carbamazepine]; Levaquin [levofloxacin in d5w]; and Lodine [etodolac]  Family History  Problem Relation Age of Onset  . Family history unknown: Yes    Social History Social History  Substance Use Topics  . Smoking status: Current Every Day Smoker    Packs/day: 1.00    Years: 35.00    Types: Cigarettes  . Smokeless tobacco: Never Used     Comment: Patches  . Alcohol use 0.0 oz/week     Comment: 06/14/2014 "might drink twice/yr"    Review of Systems  Constitutional: No fever/chills. Eyes: No visual changes. ENT: No sore throat. Cardiovascular: Positive for chest pain. Respiratory: Denies shortness of breath. Gastrointestinal: No abdominal  pain.  Positive for nausea, no vomiting.  No diarrhea.  No constipation. Genitourinary: Negative for dysuria. Musculoskeletal: Negative for back pain. Skin: Negative for rash. Neurological: Negative for headaches, focal weakness or numbness.  10-point ROS otherwise negative.  ____________________________________________   PHYSICAL EXAM:  VITAL SIGNS: ED Triage Vitals  Enc Vitals Group     BP 08/01/16 2056 (!) 176/110     Pulse Rate 08/01/16 2056 96     Resp 08/01/16 2056 (!) 22     Temp 08/01/16 2056 98.3 F (36.8 C)     Temp Source 08/01/16 2056 Oral     SpO2 08/01/16 2056 98 %     Weight 08/01/16 2056 190 lb (86.2 kg)     Height 08/01/16 2056 5\' 2"  (1.575 m)     Head Circumference --      Peak Flow --      Pain Score 08/01/16 2057 8     Pain Loc --      Pain Edu? --      Excl. in GC? --      Constitutional: Alert and oriented. Well appearing and in mild acute distress. Eyes: Conjunctivae are normal. PERRL. EOMI. Head: Atraumatic. Nose: No congestion/rhinnorhea. Mouth/Throat: Mucous membranes are moist.  Oropharynx non-erythematous. Neck: No stridor.   Cardiovascular: Normal rate, regular rhythm.  Grossly normal heart sounds.  Good peripheral circulation. Respiratory: Normal respiratory effort.  No retractions. Lungs CTAB. Gastrointestinal: Soft and nontender. No distention. No abdominal bruits. No CVA tenderness. Musculoskeletal: No lower extremity tenderness nor edema.  No joint effusions. Neurologic:  Normal speech and language. No gross focal neurologic deficits are appreciated.  Skin:  Skin is warm, dry and intact. No rash noted. Psychiatric: Mood and affect are normal. Speech and behavior are normal.  ____________________________________________   LABS (all labs ordered are listed, but only abnormal results are displayed)  Labs Reviewed  BASIC METABOLIC PANEL - Abnormal; Notable for the following:       Result Value   Glucose, Bld 496 (*)    All other components within normal limits  GLUCOSE, CAPILLARY - Abnormal; Notable for the following:    Glucose-Capillary 346 (*)    All other components within normal limits  CBC  TROPONIN I  PROTIME-INR   ____________________________________________  EKG  ED ECG REPORT I, SUNG,JADE J, the attending physician, personally viewed and interpreted this ECG.   Date: 08/02/2016  EKG Time: 2053  Rate: 93  Rhythm: normal EKG, normal sinus rhythm  Axis: Normal  Intervals:none  ST&T Change: Nonspecific  ____________________________________________  RADIOLOGY  Chest 2 view interpreted per Dr. 2054: Minimal bibasilar atelectasis noted. Lungs otherwise clear. ____________________________________________   PROCEDURES  Procedure(s) performed: None  Procedures  Critical Care performed: Yes, see critical care note(s)   CRITICAL CARE Performed by: Cherly Hensen   Total critical care time: 30 minutes  Critical care time was exclusive of separately billable procedures and treating other patients.  Critical care was necessary to treat or prevent imminent or life-threatening deterioration.  Critical care was time spent  personally by me on the following activities: development of treatment plan with patient and/or surrogate as well as nursing, discussions with consultants, evaluation of patient's response to treatment, examination of patient, obtaining history from patient or surrogate, ordering and performing treatments and interventions, ordering and review of laboratory studies, ordering and review of radiographic studies, pulse oximetry and re-evaluation of patient's condition.  ____________________________________________   INITIAL IMPRESSION / ASSESSMENT AND PLAN / ED COURSE  Pertinent  labs & imaging results that were available during my care of the patient were reviewed by me and considered in my medical decision making (see chart for details).  56 year old female with an extensive history of CAD status post stents who presents with symptoms concerning for unstable angina. Initial EKG and troponin are unremarkable. Patient is hypertensive. Will administer aspirin, nitroglycerin and discuss with hospitalist to evaluate patient in the emergency department for admission.      ____________________________________________   FINAL CLINICAL IMPRESSION(S) / ED DIAGNOSES  Final diagnoses:  Chest pain, unspecified type  Unstable angina (HCC)  Essential hypertension  Hyperglycemia      NEW MEDICATIONS STARTED DURING THIS VISIT:  New Prescriptions   No medications on file     Note:  This document was prepared using Dragon voice recognition software and may include unintentional dictation errors.    Paulette Blanch, MD 08/02/16 [id number]

## 2016-08-02 NOTE — ED Notes (Signed)
Eileen Stanford RN called report to charge RN on [facility] Lahue.  Waiting for bed approval at this time.

## 2016-08-02 NOTE — Progress Notes (Signed)
ANTICOAGULATION CONSULT NOTE - Follow Up Consult  Pharmacy Consult for Heparin Drip Indication: chest pain/ACS  Allergies  Allergen Reactions  . Lithium Other (See Comments)    Blood dyscrasia  . Tegretol [Carbamazepine] Other (See Comments)    Blood dyscrasia when on with Lithium  . Levaquin [Levofloxacin In D5w] Other (See Comments)    "Salty" sensation in mouth. "Hard time to explain it"  . Lodine [Etodolac] Other (See Comments)    Rash, N/V    Patient Measurements: Height:  (157.5 cm) Weight: 190 lb (86.2 kg) IBW/kg (Calculated) : 50.1 Heparin Dosing Weight: 69.7 kg  Vital Signs: Temp: 98.4 F (36.9 C) (04/14 2004) Temp Source: Oral (04/14 2004) BP: 141/85 (04/14 2004) Pulse Rate: 77 (04/14 2004)  Labs:  Recent Labs  08/01/16 2059 08/02/16 0353 08/02/16 0532 08/02/16 0935 08/02/16 1207 08/02/16 1954  HGB 14.1  --   --  13.1  --   --   HCT 42.3  --   --  38.5  --   --   PLT 272  --   --  238  --   --   APTT  --   --   --  45*  --   --   LABPROT 12.1  --   --   --   --   --   INR 0.90  --   --   --   --   --   HEPARINUNFRC  --   --   --  0.33 0.19* 0.40  CREATININE 0.68  --  0.31* 0.56  --   --   TROPONINI <0.03 0.09*  --  0.10* 0.10*  --     Estimated Creatinine Clearance: 82.8 mL/min (by C-G formula based on SCr of 0.56 mg/dL).  Assessment: Patient admitted w/ CP and trops 0.09 Being started on heparin drip  Goal of Therapy:  Heparin level 0.3-0.7 units/ml Monitor platelets by anticoagulation protocol: Yes   Plan:  Give 4000 units bolus x 1  Will start heparin drip @ 850 unit/hr and will check anti-Xa 4/14 @ 1200 Baseline labs ordered. Will continue to monitor CBC and signs/symptoms of bleeding.  4/14: HL@ 1207=0.19.  Will give Heparin bolus of 2100 units x 1 and increase drip to 1050 units/hr. Will recheck HL at 2000.  4/14 1954 HL therapeutic x 1. Continue current rate. Will recheck HL in 6 hours.  Carola Frost, Pharm.D.,  BCPS Clinical Pharmacist 08/02/2016,8:54 PM

## 2016-08-02 NOTE — H&P (Addendum)
[facility] NAME: Jennifer [name]    MR#:  [id number]  DATE OF BIRTH:  01/19/1961  DATE OF ADMISSION:  08/02/2016  PRIMARY CARE PHYSICIAN: Rubbie Battiest, NP   REQUESTING/REFERRING PHYSICIAN:   CHIEF COMPLAINT:   Chief Complaint  Patient presents with  . Chest Pain    HISTORY OF PRESENT ILLNESS: Jennifer [name]  is a 56 y.o. female with a known history of Coronary artery disease, cardiac stent, arthritis, anxiety disorder, GERD, hyperlipidemia, hypertension, nephrolithiasis presented to the emergency room with chest pain. The chest pain started last evening after patients daily routine, she washed the dishes and had tea and was about to go to bed, she noticed it as severe pressure like sensation in the left side of the chest. It was 7 out of 10 on a scale of 1-10. Chest pain radiated to left arm and right arm as well as to the left side of the neck. Patient came to the emergency room with the above complaints. Her troponin was negative. EKG normal sinus rhythm with no ST segment elevation. Hospitalist service was consulted for further care of the patient. Patient follows up with [facility] cardiology as outpatient.  PAST MEDICAL HISTORY:   Past Medical History:  Diagnosis Date  . Anxiety   . Arthritis    "qwhere" (06/14/2014)  . Bipolar disorder ([place])   . CAD (coronary artery disease)    a. cardiac cath: 2010: 95% mRCA s/p PCI/DES, 60% RPDA; b. cath 05/2014, widely patent LM, mild to mod dz in LAD and LCx, 90% dRCA s/p PCI/DES; c. cath 02/2015: severe ISR proximal segement of distal RCA stent s/p PCI/DES, nl EF by nuc 11/16, mildly elevated LVEDP   . Chronic bronchitis ([place])    "get it q yr"  . Daily headache    "when my blood pressure is up" (06/14/2014)  . Depression   . GERD (gastroesophageal reflux disease)   . Heart murmur   . High cholesterol   . History of stomach ulcers   . Hypertension   . Insomnia   . Kidney stones    . Kidney stones   . Migraine    "haven't had them in a good while; did get them 1-2 times/yr" (06/14/2014)  . Myocardial infarction ([place]) 2008; ~ 2012  . Pneumonia    "get it 1-2 times/year" (06/14/2014)  . RLS (restless legs syndrome)   . Seizures ([place])   . Sleep apnea    "they want me to do the lab but I haven't" (06/14/2014)  . Stroke [facility])    "they say I've had several" (06/14/2014)  . Type II diabetes mellitus ([place])   . Urinary, incontinence, stress female     PAST SURGICAL HISTORY: Past Surgical History:  Procedure Laterality Date  . ABDOMINAL HYSTERECTOMY  1984   "I've got 1 ovary left"  . CARDIAC CATHETERIZATION N/A 02/26/2015   Procedure: Left Heart Cath and Coronary Angiography;  Surgeon: Wellington Hampshire, MD;  Location: [place] CV LAB;  Service: Cardiovascular;  Laterality: N/A;  . CARDIAC CATHETERIZATION N/A 02/26/2015   Procedure: Coronary Stent Intervention;  Surgeon: Wellington Hampshire, MD;  Location: [place] CV LAB;  Service: Cardiovascular;  Laterality: N/A;  . CESAREAN SECTION  1984  . CORONARY ANGIOPLASTY WITH STENT PLACEMENT  2008; ~ 2012   "1; 1"  . DILATION AND CURETTAGE OF UTERUS    . EXTRACORPOREAL SHOCK WAVE LITHOTRIPSY  X 2  .  KNEE ARTHROSCOPY Left   . LEFT HEART CATHETERIZATION WITH CORONARY ANGIOGRAM N/A 06/15/2014   Procedure: LEFT HEART CATHETERIZATION WITH CORONARY ANGIOGRAM;  Surgeon: Jettie Booze, MD;  Location: [facility] CATH LAB;  Service: Cardiovascular;  Laterality: N/A;  . PERCUTANEOUS CORONARY STENT INTERVENTION (PCI-S)  06/15/2014   Procedure: PERCUTANEOUS CORONARY STENT INTERVENTION (PCI-S);  Surgeon: Jettie Booze, MD;  Location: [facility] CATH LAB;  Service: Cardiovascular;;  . TONSILLECTOMY    . TUBAL LIGATION      SOCIAL HISTORY:  Social History  Substance Use Topics  . Smoking status: Current Every Day Smoker    Packs/day: 1.00    Years: 35.00    Types: Cigarettes  . Smokeless tobacco: Never Used     Comment: Patches  .  Alcohol use 0.0 oz/week     Comment: 06/14/2014 "might drink twice/yr"    FAMILY HISTORY:  Family History  Problem Relation Age of Onset  . Family history unknown: Yes    DRUG ALLERGIES:  Allergies  Allergen Reactions  . Lithium Other (See Comments)    Blood dyscrasia  . Tegretol [Carbamazepine] Other (See Comments)    Blood dyscrasia when on with Lithium  . Levaquin [Levofloxacin In D5w] Other (See Comments)    "Salty" sensation in mouth. "Hard time to explain it"  . Lodine [Etodolac] Other (See Comments)    Rash, N/V    REVIEW OF SYSTEMS:   CONSTITUTIONAL: No fever, fatigue or weakness.  EYES: No blurred or double vision.  EARS, NOSE, AND THROAT: No tinnitus or ear pain.  RESPIRATORY: No cough, shortness of breath, wheezing or hemoptysis.  CARDIOVASCULAR: Has chest pain,  No orthopnea, edema.  GASTROINTESTINAL: No nausea, vomiting, diarrhea or abdominal pain.  GENITOURINARY: No dysuria, hematuria.  ENDOCRINE: No polyuria, nocturia,  HEMATOLOGY: No anemia, easy bruising or bleeding SKIN: No rash or lesion. MUSCULOSKELETAL: No joint pain or arthritis.   NEUROLOGIC: No tingling, numbness, weakness.  PSYCHIATRY: No anxiety or depression.   MEDICATIONS AT HOME:  Prior to Admission medications   Medication Sig Start Date End Date Taking? Authorizing Provider  amLODipine (NORVASC) 2.5 MG tablet Take 1 tablet (2.5 mg total) by mouth daily. 07/12/15  Yes Minna Merritts, MD  aspirin EC 81 MG EC tablet Take 1 tablet (81 mg total) by mouth daily. 02/27/15  Yes Gladstone Lighter, MD  atorvastatin (LIPITOR) 40 MG tablet take 1 tablet by mouth once daily AT 6PM AS DIRECTED 04/28/16  Yes Minna Merritts, MD  Blood Glucose Monitoring Suppl W/DEVICE KIT Test 1-2 times daily; E11.65 diagnosis 03/02/15  Yes Rubbie Battiest, NP  clopidogrel (PLAVIX) 75 MG tablet Take 1 tablet (75 mg total) by mouth daily with breakfast. Patient taking differently: Take 150 mg by mouth daily.  07/12/15  Yes  Minna Merritts, MD  furosemide (LASIX) 20 MG tablet take 1 tablet by mouth once daily 04/23/16  Yes Ryan M Dunn, PA-C  ibuprofen (ADVIL,MOTRIN) 800 MG tablet Take 800 mg by mouth every 6 (six) hours as needed.   Yes Historical Provider, MD  lisinopril (PRINIVIL,ZESTRIL) 40 MG tablet take 1 tablet by mouth once daily 07/18/16  Yes Minna Merritts, MD  metoprolol (TOPROL-XL) 200 MG 24 hr tablet take 1 tablet by mouth once daily WITH OR IMMEDIATELY FOLLOWING A MEAL 04/28/16  Yes Minna Merritts, MD  nicotine (NICODERM CQ - DOSED IN MG/24 HOURS) 21 mg/24hr patch Place 1 patch (21 mg total) onto the skin daily. 11/28/15  Yes Rise Mu, PA-C  oxyCODONE (OXY IR/ROXICODONE) 5 MG immediate release tablet Take 1 tablet (5 mg total) by mouth every 6 (six) hours as needed for moderate pain. Patient taking differently: Take 10 mg by mouth every 4 (four) hours as needed for moderate pain.  02/27/15  Yes Gladstone Lighter, MD  COMBIVENT RESPIMAT 20-100 MCG/ACT AERS respimat Inhale 1 puff into the lungs every 6 (six) hours as needed for wheezing or shortness of breath. 02/27/15   Gladstone Lighter, MD  gabapentin (NEURONTIN) 300 MG capsule Take 3 capsules (900 mg total) by mouth 3 (three) times daily. Patient taking differently: Take 900 mg by mouth 4 (four) times daily.  02/27/15   Gladstone Lighter, MD  Multiple Vitamins-Minerals (MULTIVITAMIN WITH MINERALS) tablet Take 1 tablet by mouth daily.    Historical Provider, MD      PHYSICAL EXAMINATION:   VITAL SIGNS: Blood pressure 136/88, pulse 85, temperature 98.3 F (36.8 C), temperature source Oral, resp. rate (!) 22, height _0  (1.575 m), weight 86.2 kg (190 lb), SpO2 97 %.  GENERAL:  56 y.o.-year-old patient lying in the bed with no acute distress.  EYES: Pupils equal, round, reactive to light and accommodation. No scleral icterus. Extraocular muscles intact.  HEENT: Head atraumatic, normocephalic. Oropharynx and nasopharynx clear.  NECK:  Supple, no jugular  venous distention. No thyroid enlargement, no tenderness.  LUNGS: Normal breath sounds bilaterally, no wheezing, rales,rhonchi or crepitation. No use of accessory muscles of respiration.  CARDIOVASCULAR: S1, S2 normal. No murmurs, rubs, or gallops.  ABDOMEN: Soft, nontender, nondistended. Bowel sounds present. No organomegaly or mass.  EXTREMITIES: No pedal edema, cyanosis, or clubbing.  NEUROLOGIC: Cranial nerves II through XII are intact. Muscle strength 5/5 in all extremities. Sensation intact. Gait not checked.  PSYCHIATRIC: The patient is alert and oriented x 3.  SKIN: No obvious rash, lesion, or ulcer.   LABORATORY PANEL:   CBC  Recent Labs Lab 08/01/16 2059  WBC 10.9  HGB 14.1  HCT 42.3  PLT 272  MCV 93.6  MCH 31.2  MCHC 33.3  RDW 14.1   ------------------------------------------------------------------------------------------------------------------  Chemistries   Recent Labs Lab 08/01/16 2059  NA 136  K 3.7  CL 106  CO2 22  GLUCOSE 496*  BUN 18  CREATININE 0.68  CALCIUM 9.6   ------------------------------------------------------------------------------------------------------------------ estimated creatinine clearance is 82.8 mL/min (by C-G formula based on SCr of 0.68 mg/dL). ------------------------------------------------------------------------------------------------------------------ No results for input(s): TSH, T4TOTAL, T3FREE, THYROIDAB in the last 72 hours.  Invalid input(s): FREET3   Coagulation profile  Recent Labs Lab 08/01/16 2059  INR 0.90   ------------------------------------------------------------------------------------------------------------------- No results for input(s): DDIMER in the last 72 hours. -------------------------------------------------------------------------------------------------------------------  Cardiac Enzymes  Recent Labs Lab 08/01/16 2059  TROPONINI <0.03    ------------------------------------------------------------------------------------------------------------------ Invalid input(s): POCBNP  ---------------------------------------------------------------------------------------------------------------  Urinalysis    Component Value Date/Time   COLORURINE YELLOW (A) 05/20/2016 1927   APPEARANCEUR HAZY (A) 05/20/2016 1927   APPEARANCEUR Hazy 01/30/2014 2236   LABSPEC 1.027 05/20/2016 1927   LABSPEC 1.019 01/30/2014 2236   PHURINE 5.0 05/20/2016 1927   GLUCOSEU >=500 (A) 05/20/2016 1927   GLUCOSEU Negative 01/30/2014 2236   HGBUR NEGATIVE 05/20/2016 1927   BILIRUBINUR NEGATIVE 05/20/2016 1927   BILIRUBINUR Negative 01/30/2014 2236   KETONESUR NEGATIVE 05/20/2016 1927   PROTEINUR NEGATIVE 05/20/2016 1927   NITRITE NEGATIVE 05/20/2016 1927   LEUKOCYTESUR NEGATIVE 05/20/2016 1927   LEUKOCYTESUR 2+ 01/30/2014 2236     RADIOLOGY: Dg Chest 2 View  Result Date: 08/01/2016 CLINICAL DATA:  Acute onset of generalized  chest pain and nausea. Initial encounter. EXAM: CHEST  2 VIEW COMPARISON:  Chest radiograph performed 02/23/2015 FINDINGS: The lungs are well-aerated. Minimal bibasilar atelectasis is noted. There is no evidence of pleural effusion or pneumothorax. The heart is normal in size; the mediastinal contour is within normal limits. No acute osseous abnormalities are seen. The patient is status post right-sided rotator cuff repair. IMPRESSION: Minimal bibasilar atelectasis noted.  Lungs otherwise clear. Electronically Signed   By: Garald Balding M.D.   On: 08/01/2016 21:26    EKG: Orders placed or performed during the hospital encounter of 08/02/16  . EKG 12-Lead  . EKG 12-Lead  . ED EKG within 10 minutes  . ED EKG within 10 minutes    IMPRESSION AND PLAN: 56 year old female patient with history of coronary artery disease, cardiac stent, GERD, hyperlipidemia, hypertension presented to the emergency room with chest  pain. Admitting diagnosis 1. Unstable Angina 2. Coronary artery disease 3. Hypertension 4. Hyperlipidemia 5. Uncontrolled diabetes mellitus Treatment plan Admit patient to telemetry observation bed Aspirin 81 mg daily Cycle troponin to rule out ischemia Check echocardiogram Cardiac stress test and cardiogenic consultation Resume cardiac medications DVT prophylaxis subcutaneous Lovenox 40 MG daily Control blood sugars with Lantus insulin and sliding scale coverage.  All the records are reviewed and case discussed with ED provider. Management plans discussed with the patient, family and they are in agreement.  CODE STATUS:FULL CODE Code Status History    Date Active Date Inactive Code Status Order ID Comments User Context   02/26/2015  1:50 PM 02/27/2015  4:51 PM Full Code [id number]  Wellington Hampshire, MD Inpatient   02/23/2015  8:19 AM 02/26/2015  1:50 PM Full Code [id number]  Harrie Foreman, MD Inpatient   06/15/2014 12:29 PM 06/16/2014  5:54 PM Full Code [id number]  Jettie Booze, MD Inpatient   06/14/2014  8:47 AM 06/15/2014 12:29 PM Full Code [id number]  Melton Alar, PA-C Inpatient   06/14/2014  7:25 AM 06/14/2014  8:47 AM Full Code [id number]  Louellen Molder, MD Inpatient       TOTAL TIME TAKING CARE OF THIS PATIENT: 50 minutes.    Saundra Shelling M.D on 08/02/2016 at 3:07 AM  Between 7am to 6pm - Pager - [phone]  After 6pm go to [email or web address] - password [email or web address] [facility]  Office  [phone]  CC: Primary care physician; Rubbie Battiest, NP   Patient's second set of troponin was elevated. Patient will be started on IV heparin drip. Cardiology consultation.

## 2016-08-02 NOTE — Consult Note (Signed)
Cardiology Consultation Note  Patient ID: Jennifer [name], MRN: [id number], DOB/AGE: May 17, 1960 56 y.o. Admit date: 08/02/2016   Date of Consult: 08/02/2016 Primary Physician: Rubbie Battiest, NP Primary Cardiologist: Esmond Plants, [facility]  Chief Complaint: Chest pain, jaw pain arm pain Reason for Consult: Unstable angina, known coronary artery disease Physician requesting consult: Wendie Agreste   HPI: 56 y.o. female with h/o  long history of smoking who continues to smoke,  Poor control diabetes hemoglobin A1c of 13 coronary artery disease, stent to the mid RCA 2008, repeat stenting to the mid RCA 2012 ,  cardiac catheterization February 2016 showing severe distal RCA disease estimated at 90%, patent mid RCA stents, also with 70% mid to distal circumflex disease of a small vessel, Last stress test 03/19/2015 Chronic leg pain on pain medication, seen by the pain clinic presenting to the hospital with severe chest pain, jaw pain arm pain  Reports she was in her usual state of health when she was getting ready to have a shower and go to bed. She developed acute onset of chest pain radiating to the left, left arm pain, left jaw pain. Also with shortness of breath and diaphoresis. Symptoms were worse she is ever had. . Family urgently drove her to the emergency room. Seemed to get better but when she got up to go to the bathroom in the emergency room had recurrent symptoms. Was placed on Nitropaste, heparin infusion overnight.  Initial troponin was normal, subsequent troponin 0.09, 0.1 Third 8 hour troponin pending currently  She is relieved that pain is gone but very concerned as she's never had pain like this Currently nothing by mouth, there was plan for stress test but with minimal troponin elevation, this was canceled by hospitalist service  Thyroid nodule, Had recent fine-needle aspiration at [facility]   Cardiac catheterization February 2016  The left main coronary artery is a small vessel but  widely patent. The left anterior descending artery,  In the ostium of the lower pole, there is a moderate stenosis. The left circumflex artery AV groove circumflex has a focal 70% stenosis. This vessel is quite small, likely less than 2 mm in diameter. The right coronary artery stents in the mid right coronary artery appear widely patent. There is mild disease proximally. In the distal RCA, moderate disease followed by a focal severe, 90% stenosis.  A 2.5 x 27 drug-eluting stent was deployed. postdilated with a 3.0 x 15 balloon.     Past Medical History:  Diagnosis Date  . Anxiety   . Arthritis    "qwhere" (06/14/2014)  . Bipolar disorder ([place])   . CAD (coronary artery disease)    a. cardiac cath: 2010: 95% mRCA s/p PCI/DES, 60% RPDA; b. cath 05/2014, widely patent LM, mild to mod dz in LAD and LCx, 90% dRCA s/p PCI/DES; c. cath 02/2015: severe ISR proximal segement of distal RCA stent s/p PCI/DES, nl EF by nuc 11/16, mildly elevated LVEDP   . Chronic bronchitis ([place])    "get it q yr"  . Daily headache    "when my blood pressure is up" (06/14/2014)  . Depression   . GERD (gastroesophageal reflux disease)   . Heart murmur   . High cholesterol   . History of stomach ulcers   . Hypertension   . Insomnia   . Kidney stones   . Kidney stones   . Migraine    "haven't had them in a good while; did get them 1-2 times/yr" (06/14/2014)  .  Myocardial infarction ([place]) 2008; ~ 2012  . Pneumonia    "get it 1-2 times/year" (06/14/2014)  . RLS (restless legs syndrome)   . Seizures ([place])   . Sleep apnea    "they want me to do the lab but I haven't" (06/14/2014)  . Stroke [facility])    "they say I've had several" (06/14/2014)  . Type II diabetes mellitus ([place])   . Urinary, incontinence, stress female       Most Recent Cardiac Studies: Echocardiogram done today showing normal LV function, ejection fraction greater than 55%    Surgical History:  Past Surgical History:  Procedure Laterality Date  .  ABDOMINAL HYSTERECTOMY  1984   "I've got 1 ovary left"  . CARDIAC CATHETERIZATION N/A 02/26/2015   Procedure: Left Heart Cath and Coronary Angiography;  Surgeon: Wellington Hampshire, MD;  Location: [place] [facility];  Service: Cardiovascular;  Laterality: N/A;  . CARDIAC CATHETERIZATION N/A 02/26/2015   Procedure: Coronary Stent Intervention;  Surgeon: Wellington Hampshire, MD;  Location: [place] [facility];  Service: Cardiovascular;  Laterality: N/A;  . CESAREAN SECTION  1984  . CORONARY ANGIOPLASTY WITH STENT PLACEMENT  2008; ~ 2012   "1; 1"  . DILATION AND CURETTAGE OF UTERUS    . EXTRACORPOREAL SHOCK WAVE LITHOTRIPSY  X 2  . KNEE ARTHROSCOPY Left   . LEFT HEART CATHETERIZATION WITH CORONARY ANGIOGRAM N/A 06/15/2014   Procedure: LEFT HEART CATHETERIZATION WITH CORONARY ANGIOGRAM;  Surgeon: Jettie Booze, MD;  Location: [facility] CATH LAB;  Service: Cardiovascular;  Laterality: N/A;  . PERCUTANEOUS CORONARY STENT INTERVENTION (PCI-S)  06/15/2014   Procedure: PERCUTANEOUS CORONARY STENT INTERVENTION (PCI-S);  Surgeon: Jettie Booze, MD;  Location: [facility] CATH LAB;  Service: Cardiovascular;;  . TONSILLECTOMY    . TUBAL LIGATION       Home Meds: Prior to Admission medications   Medication Sig Start Date End Date Taking? Authorizing Provider  amLODipine (NORVASC) 2.5 MG tablet Take 1 tablet (2.5 mg total) by mouth daily. 07/12/15  Yes Minna Merritts, MD  aspirin EC 81 MG EC tablet Take 1 tablet (81 mg total) by mouth daily. 02/27/15  Yes Gladstone Lighter, MD  atorvastatin (LIPITOR) 40 MG tablet take 1 tablet by mouth once daily AT 6PM AS DIRECTED 04/28/16  Yes Minna Merritts, MD  Blood Glucose Monitoring Suppl W/DEVICE KIT Test 1-2 times daily; E11.65 diagnosis 03/02/15  Yes Rubbie Battiest, NP  BUTRANS 10 MCG/HR PTWK patch Place 1 patch onto the skin once a week. 07/03/16  Yes Historical Provider, MD  clopidogrel (PLAVIX) 75 MG tablet Take 1 tablet (75 mg total) by mouth daily with  breakfast. Patient taking differently: Take 150 mg by mouth daily.  07/12/15  Yes Minna Merritts, MD  cyclobenzaprine (FLEXERIL) 10 MG tablet Take 1 tablet by mouth 3 (three) times daily. 07/17/16  Yes Historical Provider, MD  furosemide (LASIX) 20 MG tablet take 1 tablet by mouth once daily 04/23/16  Yes Ryan M Dunn, PA-C  gabapentin (NEURONTIN) 300 MG capsule Take 3 capsules (900 mg total) by mouth 3 (three) times daily. Patient taking differently: Take 900 mg by mouth 4 (four) times daily.  02/27/15  Yes Gladstone Lighter, MD  ibuprofen (ADVIL,MOTRIN) 800 MG tablet Take 800 mg by mouth every 6 (six) hours as needed.   Yes Historical Provider, MD  insulin glargine (LANTUS) 100 UNIT/ML injection Inject 52 Units into the skin at bedtime.   Yes Historical Provider, MD  lisinopril (PRINIVIL,ZESTRIL) 40  MG tablet take 1 tablet by mouth once daily 07/18/16  Yes Minna Merritts, MD  metoprolol (TOPROL-XL) 200 MG 24 hr tablet take 1 tablet by mouth once daily WITH OR IMMEDIATELY FOLLOWING A MEAL 04/28/16  Yes Minna Merritts, MD  oxyCODONE (OXY IR/ROXICODONE) 5 MG immediate release tablet Take 1 tablet (5 mg total) by mouth every 6 (six) hours as needed for moderate pain. Patient taking differently: Take 10 mg by mouth every 4 (four) hours as needed for moderate pain.  02/27/15  Yes Gladstone Lighter, MD  COMBIVENT RESPIMAT 20-100 MCG/ACT AERS respimat Inhale 1 puff into the lungs every 6 (six) hours as needed for wheezing or shortness of breath. 02/27/15   Gladstone Lighter, MD  Multiple Vitamins-Minerals (MULTIVITAMIN WITH MINERALS) tablet Take 1 tablet by mouth daily.    Historical Provider, MD  nicotine (NICODERM CQ - DOSED IN MG/24 HOURS) 21 mg/24hr patch Place 1 patch (21 mg total) onto the skin daily. Patient not taking: Reported on 08/02/2016 11/28/15   Rise Mu, PA-C    Inpatient Medications:  . aspirin  324 mg Oral NOW   Or  . aspirin  300 mg Rectal NOW  . [START ON 08/03/2016] aspirin EC  81 mg  Oral Daily  . aspirin EC  81 mg Oral Daily  . atorvastatin  40 mg Oral q1800  . clopidogrel  75 mg Oral Q breakfast  . insulin aspart  0-15 Units Subcutaneous TID WC  . insulin aspart  0-5 Units Subcutaneous QHS  . insulin aspart  4 Units Subcutaneous TID WC  . insulin glargine  20 Units Subcutaneous Daily  . lisinopril  40 mg Oral Daily  . metoprolol  200 mg Oral Daily  . multivitamin with minerals  1 tablet Oral Daily  . nicotine  21 mg Transdermal Daily   . heparin 850 Units/hr (08/02/16 0533)    Allergies:  Allergies  Allergen Reactions  . Lithium Other (See Comments)    Blood dyscrasia  . Tegretol [Carbamazepine] Other (See Comments)    Blood dyscrasia when on with Lithium  . Levaquin [Levofloxacin In D5w] Other (See Comments)    "Salty" sensation in mouth. "Hard time to explain it"  . Lodine [Etodolac] Other (See Comments)    Rash, N/V    Social History   Social History  . Marital status: Married    Spouse name: N/A  . Number of children: N/A  . Years of education: N/A   Occupational History  . Not on file.   Social History Main Topics  . Smoking status: Current Every Day Smoker    Packs/day: 1.00    Years: 35.00    Types: Cigarettes  . Smokeless tobacco: Never Used     Comment: Patches  . Alcohol use 0.0 oz/week     Comment: 06/14/2014 "might drink twice/yr"  . Drug use: Yes    Types: Marijuana  . Sexual activity: Not Currently   Other Topics Concern  . Not on file   Social History Narrative  . No narrative on file     Family History  Problem Relation Age of Onset  . Family history unknown: Yes     Review of Systems: Review of Systems  Constitutional: Positive for diaphoresis.  Respiratory: Positive for shortness of breath.   Cardiovascular: Positive for chest pain.  Gastrointestinal: Negative.   Musculoskeletal: Negative.        Jaw pain, arm pain  Neurological: Negative.   Psychiatric/Behavioral: Negative.   All  other systems reviewed  and are negative.   Labs: CBC  Recent Labs  08/01/16 2059 08/02/16 0935  WBC 10.9 10.5  HGB 14.1 13.1  HCT 42.3 38.5  MCV 93.6 91.2  PLT 272 694   Basic Metabolic Panel  Recent Labs  08/01/16 2059 08/02/16 0532 08/02/16 0935  NA 136 136  --   K 3.7 3.6  --   CL 106 106  --   CO2 22 23  --   GLUCOSE 496* 271*  --   BUN 18 15  --   CREATININE 0.68 0.31* 0.56  CALCIUM 9.6 8.5*  --    Liver Function Tests No results for input(s): AST, ALT, ALKPHOS, BILITOT, PROT, ALBUMIN in the last 72 hours. No results for input(s): LIPASE, AMYLASE in the last 72 hours. Cardiac Enzymes  Recent Labs  08/01/16 2059 08/02/16 0353 08/02/16 0935  TROPONINI <0.03 0.09* 0.10*   BNP Invalid input(s): POCBNP D-Dimer No results for input(s): DDIMER in the last 72 hours. Hemoglobin A1C No results for input(s): HGBA1C in the last 72 hours. Fasting Lipid Panel  Recent Labs  08/02/16 0935  CHOL 140  HDL 35*  LDLCALC 71  TRIG 172*  CHOLHDL 4.0   Thyroid Function Tests No results for input(s): TSH, T4TOTAL, T3FREE, THYROIDAB in the last 72 hours.  Invalid input(s): FREET3  Radiology/Studies:  Dg Chest 2 View  Result Date: 08/01/2016 CLINICAL DATA:  Acute onset of generalized chest pain and nausea. Initial encounter. EXAM: CHEST  2 VIEW COMPARISON:  Chest radiograph performed 02/23/2015 FINDINGS: The lungs are well-aerated. Minimal bibasilar atelectasis is noted. There is no evidence of pleural effusion or pneumothorax. The heart is normal in size; the mediastinal contour is within normal limits. No acute osseous abnormalities are seen. The patient is status post right-sided rotator cuff repair. IMPRESSION: Minimal bibasilar atelectasis noted.  Lungs otherwise clear. Electronically Signed   By: Garald Balding M.D.   On: 08/01/2016 21:26    EKG: EKG on today's visit shows normal sinus rhythm  no significant ST or T-wave changes, prolonged QTC  EKG lab work, chest x-ray,  echocardiogram reviewed independently by myself  Weights: Filed Weights   08/01/16 2056  Weight: 190 lb (86.2 kg)     Physical Exam:Telemetry showing normal sinus rhythm Blood pressure 139/74, pulse 90, temperature 98.3 F (36.8 C), temperature source Oral, resp. rate 16, height _0  (1.575 m), weight 190 lb (86.2 kg), SpO2 94 %. Body mass index is 34.75 kg/m. GEN: Well nourished, well developed, in no acute distress, obese.  HEENT: Grossly normal.  Neck: Supple, no JVD, carotid bruits, or masses. Cardiac: RRR, no murmurs, rubs, or gallops. No clubbing, cyanosis, edema.  Radials/DP/PT 2+ and equal bilaterally.  Respiratory:  Mildly decreased breath sounds throughout, clear to auscultation bilaterally. GI: Soft, nontender, nondistended, BS + x 4. MS: no deformity or atrophy. Skin: warm and dry, no rash. Neuro:  Strength and sensation are intact. Psych: AAOx3.  Normal affect.   Assessment and Plan:   56 year old woman with known coronary artery disease, previous stenting to the RCA, last ischemic evaluation in 2016, poor control diabetes with A1c of 13, continued smoking who presents with unstable angina, minimally elevated troponin.  1. Unstable angina Minimal elevation of her troponin in the setting of severe symptoms concerning for ischemia, known disease, numerous risk factors are reported controlled including diabetes and continued smoking. Echocardiogram with no significant wall motion abnormalities, ejection fraction greater than 55% Discussed various treatment options with  her, we have recommended cardiac catheterization given the unstable nature of her symptoms, risk profile, mild elevation of her troponin,  She is indicated she would like to wait for the next troponin and if this continues to trend upwards, she would be interested in catheterization She needs to talk with her family concerning making arrangements to take care of young children in her house -For now would  continue heparin infusion, aspirin, Plavix, statin, beta blocker  2) poorly controlled diabetes with circulatory complication Dietary and medication noncompliance Outpatient follow-up with primary care, may benefit from referral to endocrine  3) smoker Continues to smoke one pack per day Previously tried Chantix, did not remember if she had side effects Vapor cigarettes do not seem to work, unable to afford nicotine patches   4) hypertension Continue current medications for now May need to hold lisinopril if catheterization performed on Monday   Total encounter time more than 110 minutes  Greater than 50% was spent in counseling and coordination of care with the patient   Signed, Esmond Plants, MD, Ph.D [facility] 08/02/2016

## 2016-08-02 NOTE — Progress Notes (Signed)
[facility] - [place] at [facility]   PATIENT NAME: Jennifer [name]    MR#:  [id number]  DATE OF BIRTH:  20-Jul-1960  SUBJECTIVE:  Patient came in with chest pain and diaphoresis  REVIEW OF SYSTEMS:   Review of Systems  Constitutional: Positive for diaphoresis. Negative for chills, fever and weight loss.  HENT: Negative for ear discharge, ear pain and nosebleeds.   Eyes: Negative for blurred vision, pain and discharge.  Respiratory: Negative for sputum production, shortness of breath, wheezing and stridor.   Cardiovascular: Positive for chest pain. Negative for palpitations, orthopnea and PND.  Gastrointestinal: Negative for abdominal pain, diarrhea, nausea and vomiting.  Genitourinary: Negative for frequency and urgency.  Musculoskeletal: Negative for back pain and joint pain.  Neurological: Negative for sensory change, speech change, focal weakness and weakness.  Psychiatric/Behavioral: Negative for depression and hallucinations. The patient is not nervous/anxious.    Tolerating Diet:yes Tolerating PT: not needed  DRUG ALLERGIES:   Allergies  Allergen Reactions  . Lithium Other (See Comments)    Blood dyscrasia  . Tegretol [Carbamazepine] Other (See Comments)    Blood dyscrasia when on with Lithium  . Levaquin [Levofloxacin In D5w] Other (See Comments)    "Salty" sensation in mouth. "Hard time to explain it"  . Lodine [Etodolac] Other (See Comments)    Rash, N/V    VITALS:  Blood pressure 139/74, pulse 90, temperature 98.3 F (36.8 C), temperature source Oral, resp. rate 16, height  (1.575 m), weight 86.2 kg (190 lb), SpO2 94 %.  PHYSICAL EXAMINATION:   Physical Exam  GENERAL:  56 y.o.-year-old patient lying in the bed with no acute distress. obese EYES: Pupils equal, round, reactive to light and accommodation. No scleral icterus. Extraocular muscles intact.  HEENT: Head atraumatic, normocephalic. Oropharynx and nasopharynx clear.  NECK:   Supple, no jugular venous distention. No thyroid enlargement, no tenderness.  LUNGS: Normal breath sounds bilaterally, no wheezing, rales, rhonchi. No use of accessory muscles of respiration.  CARDIOVASCULAR: S1, S2 normal. No murmurs, rubs, or gallops.  ABDOMEN: Soft, nontender, nondistended. Bowel sounds present. No organomegaly or mass.  EXTREMITIES: No cyanosis, clubbing or edema b/l.    NEUROLOGIC: Cranial nerves II through XII are intact. No focal Motor or sensory deficits b/l.   PSYCHIATRIC:  patient is alert and oriented x 3.  SKIN: No obvious rash, lesion, or ulcer.   LABORATORY PANEL:  CBC  Recent Labs Lab 08/02/16 0935  WBC 10.5  HGB 13.1  HCT 38.5  PLT 238    Chemistries   Recent Labs Lab 08/02/16 0532 08/02/16 0935  NA 136  --   K 3.6  --   CL 106  --   CO2 23  --   GLUCOSE 271*  --   BUN 15  --   CREATININE 0.31* 0.56  CALCIUM 8.5*  --    Cardiac Enzymes  Recent Labs Lab 08/02/16 1207  TROPONINI 0.10*   RADIOLOGY:  Dg Chest 2 View  Result Date: 08/01/2016 CLINICAL DATA:  Acute onset of generalized chest pain and nausea. Initial encounter. EXAM: CHEST  2 VIEW COMPARISON:  Chest radiograph performed 02/23/2015 FINDINGS: The lungs are well-aerated. Minimal bibasilar atelectasis is noted. There is no evidence of pleural effusion or pneumothorax. The heart is normal in size; the mediastinal contour is within normal limits. No acute osseous abnormalities are seen. The patient is status post right-sided rotator cuff repair. IMPRESSION: Minimal bibasilar atelectasis noted.  Lungs otherwise clear. Electronically Signed  By: Roanna Raider M.D.   On: 08/01/2016 21:26   ASSESSMENT AND PLAN:  56 year old woman with known coronary artery disease, previous stenting to the RCA, last ischemic evaluation in 2016, poor control diabetes with A1c of 13, continued smoking who presents with unstable angina, minimally elevated troponin.  1. Unstable angina Minimal  elevation of her troponin in the setting of severe symptoms concerning for ischemia, known disease, multiple risk factors are reported controlled including diabetes and continued smoking. Echocardiogram with no significant wall motion abnormalities, ejection fraction greater than 55% -For now would continue heparin infusion, aspirin, Plavix, statin, beta blocker -Troponin 0.03--- 0.09--0.1--- 0.1 -Repeat EKG negative -Cardiac consultation with Dr. Mariah Milling appreciated. Cath versus stress test to be decided  2) poorly controlled diabetes with circulatory complication Dietary and medication noncompliance Outpatient follow-up with primary care, may benefit from referral to endocrine  3) smoker Continues to smoke one pack per day Previously tried Chantix, did not remember if she had side effects Patient advised smoking cessation 4) hypertension Continue current medications for now May need to hold lisinopril if catheterization performed on Monday  Case discussed with Care Management/Social Worker. Management plans discussed with the patient, family and they are in agreement.  CODE STATUS: Full DVT Prophylaxis: Heparin drip  TOTAL TIME TAKING CARE OF THIS PATIENT: 30 minutes.  >50% time spent on counselling and coordination of care  POSSIBLE D/C IN DAY1-2 S, DEPENDING ON CLINICAL CONDITION.  Note: This dictation was prepared with Dragon dictation along with smaller phrase technology. Any transcriptional errors that result from this process are unintentional.  Dayna Alia M.D on 08/02/2016 at 1:51 PM  Between 7am to 6pm - Pager - [phone]  After 6pm go to [email or web address] - Social research officer, government  [facility]  Office  [phone]  CC: Primary care physician; Carollee Leitz, NP

## 2016-08-02 NOTE — Progress Notes (Signed)
ANTICOAGULATION CONSULT NOTE - Initial Consult  Pharmacy Consult for heparin dosing Indication: chest pain/ACS  Allergies  Allergen Reactions  . Lithium Other (See Comments)    Blood dyscrasia  . Tegretol [Carbamazepine] Other (See Comments)    Blood dyscrasia when on with Lithium  . Levaquin [Levofloxacin In D5w] Other (See Comments)    "Salty" sensation in mouth. "Hard time to explain it"  . Lodine [Etodolac] Other (See Comments)    Rash, N/V    Patient Measurements: Height:  (157.5 cm) Weight: 190 lb (86.2 kg) IBW/kg (Calculated) : 50.1 Heparin Dosing Weight: 70 kg  Vital Signs: Temp: 98.3 F (36.8 C) (04/13 2056) Temp Source: Oral (04/13 2056) BP: 136/88 (04/14 0230) Pulse Rate: 85 (04/14 0230)  Labs:  Recent Labs  08/01/16 2059 08/02/16 0353  HGB 14.1  --   HCT 42.3  --   PLT 272  --   LABPROT 12.1  --   INR 0.90  --   CREATININE 0.68  --   TROPONINI <0.03 0.09*    Estimated Creatinine Clearance: 82.8 mL/min (by C-G formula based on SCr of 0.68 mg/dL).   Medical History: Past Medical History:  Diagnosis Date  . Anxiety   . Arthritis    "qwhere" (06/14/2014)  . Bipolar disorder ([facility])   . CAD (coronary artery disease)    a. cardiac cath: 2010: 95% mRCA s/p PCI/DES, 60% RPDA; b. cath 05/2014, widely patent LM, mild to mod dz in LAD and LCx, 90% dRCA s/p PCI/DES; c. cath 02/2015: severe ISR proximal segement of distal RCA stent s/p PCI/DES, nl EF by nuc 11/16, mildly elevated LVEDP   . Chronic bronchitis ([facility])    "get it q yr"  . Daily headache    "when my blood pressure is up" (06/14/2014)  . Depression   . GERD (gastroesophageal reflux disease)   . Heart murmur   . High cholesterol   . History of stomach ulcers   . Hypertension   . Insomnia   . Kidney stones   . Kidney stones   . Migraine    "haven't had them in a good while; did get them 1-2 times/yr" (06/14/2014)  . Myocardial infarction ([facility]) 2008; ~ 2012  . Pneumonia    "get it 1-2  times/year" (06/14/2014)  . RLS (restless legs syndrome)   . Seizures ([facility])   . Sleep apnea    "they want me to do the lab but I haven't" (06/14/2014)  . Stroke [facility])    "they say I've had several" (06/14/2014)  . Type II diabetes mellitus ([facility])   . Urinary, incontinence, stress female     Medications:  Scheduled:  . aspirin EC  81 mg Oral Daily  . atorvastatin  40 mg Oral q1800  . clopidogrel  75 mg Oral Q breakfast  . heparin  4,000 Units Intravenous Once  . insulin aspart  0-15 Units Subcutaneous TID WC  . insulin aspart  0-5 Units Subcutaneous QHS  . insulin aspart  4 Units Subcutaneous TID WC  . insulin glargine  20 Units Subcutaneous Daily  . lisinopril  40 mg Oral Daily  . metoprolol  200 mg Oral Daily  . multivitamin with minerals  1 tablet Oral Daily  . nicotine  21 mg Transdermal Daily    Assessment: Patient admitted w/ CP and trops 0.09 Being started on heparin drip  Goal of Therapy:  Heparin level 0.3-0.7 units/ml Monitor platelets by anticoagulation protocol: Yes   Plan:  Give 4000  units bolus x 1  Will start heparin drip @ 850 unit/hr and will check anti-Xa 4/14 @ 1200 Baseline labs ordered. Will continue to monitor CBC and signs/symptoms of bleeding.  Thank you for this consult.  Thomasene Ripple, PharmD, BCPS Clinical Pharmacist 08/02/2016

## 2016-08-03 DIAGNOSIS — I25110 Atherosclerotic heart disease of native coronary artery with unstable angina pectoris: Secondary | ICD-10-CM | POA: Diagnosis not present

## 2016-08-03 DIAGNOSIS — Z6837 Body mass index (BMI) 37.0-37.9, adult: Secondary | ICD-10-CM | POA: Diagnosis not present

## 2016-08-03 DIAGNOSIS — G2581 Restless legs syndrome: Secondary | ICD-10-CM | POA: Diagnosis present

## 2016-08-03 DIAGNOSIS — I200 Unstable angina: Secondary | ICD-10-CM | POA: Diagnosis present

## 2016-08-03 DIAGNOSIS — Z9114 Patient's other noncompliance with medication regimen: Secondary | ICD-10-CM | POA: Diagnosis not present

## 2016-08-03 DIAGNOSIS — Z90710 Acquired absence of both cervix and uterus: Secondary | ICD-10-CM | POA: Diagnosis not present

## 2016-08-03 DIAGNOSIS — Z720 Tobacco use: Secondary | ICD-10-CM | POA: Diagnosis not present

## 2016-08-03 DIAGNOSIS — Z8673 Personal history of transient ischemic attack (TIA), and cerebral infarction without residual deficits: Secondary | ICD-10-CM | POA: Diagnosis not present

## 2016-08-03 DIAGNOSIS — I10 Essential (primary) hypertension: Secondary | ICD-10-CM | POA: Diagnosis not present

## 2016-08-03 DIAGNOSIS — G894 Chronic pain syndrome: Secondary | ICD-10-CM | POA: Diagnosis present

## 2016-08-03 DIAGNOSIS — R079 Chest pain, unspecified: Secondary | ICD-10-CM | POA: Diagnosis present

## 2016-08-03 DIAGNOSIS — E1159 Type 2 diabetes mellitus with other circulatory complications: Secondary | ICD-10-CM | POA: Diagnosis present

## 2016-08-03 DIAGNOSIS — Z7902 Long term (current) use of antithrombotics/antiplatelets: Secondary | ICD-10-CM | POA: Diagnosis not present

## 2016-08-03 DIAGNOSIS — Z955 Presence of coronary angioplasty implant and graft: Secondary | ICD-10-CM | POA: Diagnosis not present

## 2016-08-03 DIAGNOSIS — E041 Nontoxic single thyroid nodule: Secondary | ICD-10-CM | POA: Diagnosis present

## 2016-08-03 DIAGNOSIS — T82858A Stenosis of vascular prosthetic devices, implants and grafts, initial encounter: Secondary | ICD-10-CM | POA: Diagnosis present

## 2016-08-03 DIAGNOSIS — F17210 Nicotine dependence, cigarettes, uncomplicated: Secondary | ICD-10-CM | POA: Diagnosis present

## 2016-08-03 DIAGNOSIS — Z9111 Patient's noncompliance with dietary regimen: Secondary | ICD-10-CM | POA: Diagnosis not present

## 2016-08-03 DIAGNOSIS — Z7982 Long term (current) use of aspirin: Secondary | ICD-10-CM | POA: Diagnosis not present

## 2016-08-03 DIAGNOSIS — F17200 Nicotine dependence, unspecified, uncomplicated: Secondary | ICD-10-CM | POA: Diagnosis not present

## 2016-08-03 DIAGNOSIS — K219 Gastro-esophageal reflux disease without esophagitis: Secondary | ICD-10-CM | POA: Diagnosis present

## 2016-08-03 DIAGNOSIS — F319 Bipolar disorder, unspecified: Secondary | ICD-10-CM | POA: Diagnosis present

## 2016-08-03 DIAGNOSIS — E669 Obesity, unspecified: Secondary | ICD-10-CM | POA: Diagnosis present

## 2016-08-03 DIAGNOSIS — E7800 Pure hypercholesterolemia, unspecified: Secondary | ICD-10-CM | POA: Diagnosis present

## 2016-08-03 DIAGNOSIS — Y832 Surgical operation with anastomosis, bypass or graft as the cause of abnormal reaction of the patient, or of later complication, without mention of misadventure at the time of the procedure: Secondary | ICD-10-CM | POA: Diagnosis present

## 2016-08-03 DIAGNOSIS — E785 Hyperlipidemia, unspecified: Secondary | ICD-10-CM | POA: Diagnosis not present

## 2016-08-03 DIAGNOSIS — E1165 Type 2 diabetes mellitus with hyperglycemia: Secondary | ICD-10-CM | POA: Diagnosis present

## 2016-08-03 DIAGNOSIS — G4730 Sleep apnea, unspecified: Secondary | ICD-10-CM | POA: Diagnosis present

## 2016-08-03 DIAGNOSIS — I252 Old myocardial infarction: Secondary | ICD-10-CM | POA: Diagnosis not present

## 2016-08-03 LAB — CBC
HEMATOCRIT: 39.3 % (ref 35.0–47.0)
Hemoglobin: 13.2 g/dL (ref 12.0–16.0)
MCH: 31.5 pg (ref 26.0–34.0)
MCHC: 33.7 g/dL (ref 32.0–36.0)
MCV: 93.3 fL (ref 80.0–100.0)
Platelets: 228 10*3/uL (ref 150–440)
RBC: 4.21 MIL/uL (ref 3.80–5.20)
RDW: 14 % (ref 11.5–14.5)
WBC: 9.9 10*3/uL (ref 3.6–11.0)

## 2016-08-03 LAB — GLUCOSE, CAPILLARY
GLUCOSE-CAPILLARY: 302 mg/dL — AB (ref 65–99)
GLUCOSE-CAPILLARY: 195 mg/dL — AB (ref 65–99)
GLUCOSE-CAPILLARY: 343 mg/dL — AB (ref 65–99)
GLUCOSE-CAPILLARY: 276 mg/dL — AB (ref 65–99)

## 2016-08-03 LAB — TROPONIN I: Troponin I: 0.12 ng/mL (ref <0.03)

## 2016-08-03 LAB — HEPARIN LEVEL (UNFRACTIONATED)
HEPARIN UNFRACTIONATED: 0.3 [IU]/mL (ref 0.30–0.70)
HEPARIN UNFRACTIONATED: 0.36 [IU]/mL (ref 0.30–0.70)

## 2016-08-03 MED ORDER — MORPHINE SULFATE (PF) 4 MG/ML IV SOLN
2.0000 mg | Freq: Once | INTRAVENOUS | Status: AC
  Administered 2016-08-03: 2 mg via INTRAVENOUS
  Filled 2016-08-03 (×2): qty 1

## 2016-08-03 MED ORDER — SODIUM CHLORIDE 0.9 % IV BOLUS (SEPSIS)
500.0000 mL | Freq: Once | INTRAVENOUS | Status: AC
  Administered 2016-08-03: 500 mL via INTRAVENOUS

## 2016-08-03 MED ORDER — HYDROCODONE-ACETAMINOPHEN 5-325 MG PO TABS
1.0000 | ORAL_TABLET | ORAL | Status: DC | PRN
  Administered 2016-08-03 – 2016-08-04 (×5): 2 via ORAL
  Administered 2016-08-05 (×2): 1 via ORAL
  Administered 2016-08-05: 2 via ORAL
  Filled 2016-08-03 (×4): qty 2
  Filled 2016-08-03 (×2): qty 1
  Filled 2016-08-03 (×2): qty 2

## 2016-08-03 MED ORDER — MORPHINE SULFATE (PF) 4 MG/ML IV SOLN
2.0000 mg | INTRAVENOUS | Status: DC | PRN
  Administered 2016-08-03 – 2016-08-04 (×4): 2 mg via INTRAVENOUS
  Filled 2016-08-03 (×4): qty 1

## 2016-08-03 NOTE — Progress Notes (Signed)
ANTICOAGULATION CONSULT NOTE - Follow Up Consult  Pharmacy Consult for Heparin Drip Indication: chest pain/ACS       Allergies  Allergen Reactions  . Lithium Other (See Comments)    Blood dyscrasia  . Tegretol [Carbamazepine] Other (See Comments)    Blood dyscrasia when on with Lithium  . Levaquin [Levofloxacin In D5w] Other (See Comments)    "Salty" sensation in mouth. "Hard time to explain it"  . Lodine [Etodolac] Other (See Comments)    Rash, N/V    Patient Measurements: Height:  (157.5 cm) Weight: 190 lb (86.2 kg) IBW/kg (Calculated) : 50.1 Heparin Dosing Weight: 69.7 kg  Vital Signs: Temp: 98.4 F (36.9 C) (04/14 2004) Temp Source: Oral (04/14 2004) BP: 141/85 (04/14 2004) Pulse Rate: 77 (04/14 2004)  Labs:  Recent Labs (last 2 labs)    Recent Labs  08/01/16 2059 08/02/16 0353 08/02/16 0532 08/02/16 0935 08/02/16 1207 08/02/16 1954  HGB 14.1  --   --  13.1  --   --   HCT 42.3  --   --  38.5  --   --   PLT 272  --   --  238  --   --   APTT  --   --   --  45*  --   --   LABPROT 12.1  --   --   --   --   --   INR 0.90  --   --   --   --   --   HEPARINUNFRC  --   --   --  0.33 0.19* 0.40  CREATININE 0.68  --  0.31* 0.56  --   --   TROPONINI <0.03 0.09*  --  0.10* 0.10*  --       Estimated Creatinine Clearance: 82.8 mL/min (by C-G formula based on SCr of 0.56 mg/dL).  Assessment: Patient admitted w/ CP and trops 0.09 Being started on heparin drip  Goal of Therapy: Heparin level 0.3-0.7 units/ml Monitor platelets by anticoagulation protocol: Yes  Plan: Give 4000units bolus x 1 Will start heparin drip @ 850 unit/hr and will check anti-Xa 4/14 @ 1200 Baseline labs ordered. Will continue to monitor CBC and signs/symptoms of bleeding.  4/14: HL@ 1207=0.19.  Will give Heparin bolus of 2100 units x 1 and increase drip to 1050 units/hr. Will recheck HL at 2000.  4/14 1954 HL therapeutic x 1. Continue current rate. Will  recheck HL in 6 hours.  4/15 @ 0200 HL: 0.30 therapeutic on lower end of range; however, went down by 0.1, will increase rate to 1100 units/hr and recheck HL @ 0800.   Thank you for this consult.  Thomasene Ripple, PharmD, BCPS Clinical Pharmacist 08/03/2016

## 2016-08-03 NOTE — Progress Notes (Signed)
Progress Note  Patient Name: Jennifer [name] Date of Encounter: 08/03/2016  Primary Cardiologist: Mariah Milling  Subjective   Received phone call from nurse practitioner in the ICU this morning By her report, patient had severe stuttering chest pain overnight Patient reports having pain on any movement such as going to the bathroom Hospitalist manage the phone calls overnight, started on nitroglycerin infusion this morning EKG late last night with nonspecific T-wave abnormality this morning with repeat EKG showing new clear T-wave inversions in lead 3 and aVF  This morning she is resting comfortably, sleeping, received morphine and on 35 g nitroglycerin infusion Reports having stuttering chest pain through yesterday, severe overnight TNT of 0.12 Previously 0.1   Inpatient Medications    Scheduled Meds: . aspirin EC  81 mg Oral Daily  . atorvastatin  40 mg Oral q1800  . clopidogrel  75 mg Oral Q breakfast  . gabapentin  900 mg Oral TID  . insulin aspart  0-15 Units Subcutaneous TID WC  . insulin aspart  0-5 Units Subcutaneous QHS  . insulin aspart  4 Units Subcutaneous TID WC  . insulin glargine  20 Units Subcutaneous Daily  . lisinopril  40 mg Oral Daily  . metoprolol  200 mg Oral Daily  . multivitamin with minerals  1 tablet Oral Daily  . nicotine  21 mg Transdermal Daily   Continuous Infusions: . heparin 1,100 Units/hr (08/03/16 0357)  . nitroGLYCERIN 30 mcg/min (08/03/16 1200)   PRN Meds: acetaminophen, cyclobenzaprine, HYDROcodone-acetaminophen, ibuprofen, ipratropium-albuterol, morphine injection, nitroGLYCERIN, ondansetron (ZOFRAN) IV   Vital Signs    Vitals:   08/03/16 1000 08/03/16 1138 08/03/16 1200 08/03/16 1300  BP: 115/81 93/68 107/71 106/70  Pulse: 84 77 71 62  Resp:  18    Temp:  97.5 F (36.4 C)    TempSrc:  Oral    SpO2:  94%    Weight:      Height:        Intake/Output Summary (Last 24 hours) at 08/03/16 1412 Last data filed at 08/03/16  1057  Gross per 24 hour  Intake           365.69 ml  Output             3450 ml  Net         -3084.31 ml   Filed Weights   08/01/16 2056  Weight: 190 lb (86.2 kg)    Telemetry    Normal sinus rhythm - Personally Reviewed  ECG    Normal sinus rhythm with T-wave inversions 3 and aVF- Personally Reviewed  Physical Exam    GEN: No acute distress. , Obese, sleeping when I first walked in Neck: No JVD  Cardiac: RRR, no murmurs, rubs, or gallops.  Radials/DP/PT 2+ and equal bilaterally.  Respiratory:  Clear to auscultation bilaterally. GI: Soft, nontender, non-distended  MS: no deformity; no edema Neuro:  Alert and oriented x 3  Labs    Chemistry Recent Labs Lab 08/01/16 2059 08/02/16 0532 08/02/16 0935  NA 136 136  --   K 3.7 3.6  --   CL 106 106  --   CO2 22 23  --   GLUCOSE 496* 271*  --   BUN 18 15  --   CREATININE 0.68 0.31* 0.56  CALCIUM 9.6 8.5*  --   GFRNONAA >60 >60 >60  GFRAA >60 >60 >60  ANIONGAP 8 7  --      Hematology Recent Labs Lab 08/01/16 2059 08/02/16  0935 08/03/16 0158  WBC 10.9 10.5 9.9  RBC 4.52 4.22 4.21  HGB 14.1 13.1 13.2  HCT 42.3 38.5 39.3  MCV 93.6 91.2 93.3  MCH 31.2 31.0 31.5  MCHC 33.3 34.0 33.7  RDW 14.1 13.6 14.0  PLT 272 238 228    Cardiac Enzymes Recent Labs Lab 08/02/16 0353 08/02/16 0935 08/02/16 1207 08/03/16 0739  TROPONINI 0.09* 0.10* 0.10* 0.12*   No results for input(s): TROPIPOC in the last 168 hours.   BNPNo results for input(s): BNP, PROBNP in the last 168 hours.   DDimer No results for input(s): DDIMER in the last 168 hours.   Radiology    Dg Chest 2 View  Result Date: 08/01/2016 CLINICAL DATA:  Acute onset of generalized chest pain and nausea. Initial encounter. EXAM: CHEST  2 VIEW COMPARISON:  Chest radiograph performed 02/23/2015 FINDINGS: The lungs are well-aerated. Minimal bibasilar atelectasis is noted. There is no evidence of pleural effusion or pneumothorax. The heart is normal in size;  the mediastinal contour is within normal limits. No acute osseous abnormalities are seen. The patient is status post right-sided rotator cuff repair. IMPRESSION: Minimal bibasilar atelectasis noted.  Lungs otherwise clear. Electronically Signed   By: Roanna Raider M.D.   On: 08/01/2016 21:26    Cardiac Studies     Patient Profile     56 y.o. female with known coronary artery disease, previous stenting to the RCA, last ischemic evaluation in 2016, poor control diabetes with A1c of 13, continued smoking who presents with unstable angina, minimally elevated troponin. Stuttering chest pain overnight  Assessment & Plan    1. Unstable angina Minimal elevation of her troponin in the setting of severe symptoms concerning for ischemia, known disease, numerous risk factors:  including diabetes and continued smoking. Echocardiogram with no significant wall motion abnormalities, ejection fraction greater than 55%  cardiac catheterization Scheduled for first case tomorrow given the unstable nature of her symptoms, risk profile, mild elevation of her troponin  would continue heparin infusion, aspirin, Plavix, statin, beta blocker For recurrent pain, would give sublingual nitroglycerin and morphine, could also start IIb IIIa inhibitor I have reviewed the risks, indications, and alternatives to cardiac catheterization, possible angioplasty, and stenting with the patient. Risks include but are not limited to bleeding, infection, vascular injury, stroke, myocardial infection, arrhythmia, kidney injury, radiation-related injury in the case of prolonged fluoroscopy use, emergency cardiac surgery, and death. The patient understands the risks of serious complication is 1-2 in 1000 with diagnostic cardiac cath and 1-2% or less with angioplasty/stenting.   2) poorly controlled diabetes with circulatory complication Dietary and medication noncompliance  3) smoker Continues to smoke one pack per day Previously  tried Chantix, did not remember if she had side effects Vapor cigarettes do not seem to work, unable to afford nicotine patches   4) hypertension Continue current medications for now May need to hold lisinopril if catheterization performed on Monday   Total encounter time more than 35 minutes  Greater than 50% was spent in counseling and coordination of care with the patient   Signed, Julien Nordmann, MD  08/03/2016, 2:12 PM

## 2016-08-03 NOTE — Progress Notes (Signed)
Patient on heparin drip and nitroglycerin drip for unstable angina. History of cad and stent. Has severe chest pain in spite of being on nitro drip. Cannot titrate further on floor for nitro drip,EKG shows no ST segment changes. Troponin 0.1. Will transfer patient to step down unit for further titration of nitroglycerin drip to control chest pain. Cardiology follow up.

## 2016-08-03 NOTE — Care Management Obs Status (Signed)
MEDICARE OBSERVATION STATUS NOTIFICATION   Patient Details  Name: Jennifer [name] MRN: [id number] Date of Birth: 1961-01-08   Medicare Observation Status Notification Given:  Yes (MOON letter given)    Jolee Ewing, RN 08/03/2016, 2:43 PM

## 2016-08-03 NOTE — Progress Notes (Signed)
ANTICOAGULATION CONSULT NOTE - Follow Up Consult  Pharmacy Consult for Heparin Drip Indication: chest pain/ACS  Allergies  Allergen Reactions  . Lithium Other (See Comments)    Blood dyscrasia  . Tegretol [Carbamazepine] Other (See Comments)    Blood dyscrasia when on with Lithium  . Levaquin [Levofloxacin In D5w] Other (See Comments)    "Salty" sensation in mouth. "Hard time to explain it"  . Lodine [Etodolac] Other (See Comments)    Rash, N/V    Patient Measurements: Height:  (157.5 cm) Weight: 190 lb (86.2 kg) IBW/kg (Calculated) : 50.1 Heparin Dosing Weight: 69.7 kg  Vital Signs: Temp: 98.3 F (36.8 C) (04/15 0428) BP: 116/73 (04/15 0815) Pulse Rate: 89 (04/15 0815)  Labs:  Recent Labs  08/01/16 2059  08/02/16 0532  08/02/16 0935 08/02/16 1207 08/02/16 1954 08/03/16 0158 08/03/16 0739  HGB 14.1  --   --   --  13.1  --   --  13.2  --   HCT 42.3  --   --   --  38.5  --   --  39.3  --   PLT 272  --   --   --  238  --   --  228  --   APTT  --   --   --   --  45*  --   --   --   --   LABPROT 12.1  --   --   --   --   --   --   --   --   INR 0.90  --   --   --   --   --   --   --   --   HEPARINUNFRC  --   --   --   < > 0.33 0.19* 0.40 0.30 0.36  CREATININE 0.68  --  0.31*  --  0.56  --   --   --   --   TROPONINI <0.03  < >  --   --  0.10* 0.10*  --   --  0.12*  < > = values in this interval not displayed.  Estimated Creatinine Clearance: 82.8 mL/min (by C-G formula based on SCr of 0.56 mg/dL).  Assessment: Patient admitted w/ CP and trops 0.09 Being started on heparin drip  Goal of Therapy:  Heparin level 0.3-0.7 units/ml Monitor platelets by anticoagulation protocol: Yes   Plan:  Give 4000 units bolus x 1  Will start heparin drip @ 850 unit/hr and will check anti-Xa 4/14 @ 1200 Baseline labs ordered. Will continue to monitor CBC and signs/symptoms of bleeding.  4/14: HL@ 1207=0.19.  Will give Heparin bolus of 2100 units x 1 and increase drip to  1050 units/hr. Will recheck HL at 2000.  4/14 1954 HL therapeutic x 1. Continue current rate. Will recheck HL in 6 hours.  4/15 @ 0200 HL: 0.30 therapeutic on lower end of range; however, went down by 0.1, will increase rate to 1100 units/hr and recheck HL @ 0800.   4/15 HL@ 0739= 0.36. Will continue current drip rate and f/u HL/CBC with am labs.  Glanz,Kyrese Gartman A, Pharm.D., BCPS Clinical Pharmacist 08/03/2016,8:56 AM

## 2016-08-03 NOTE — Progress Notes (Signed)
Patient ID: Jennifer [name], female   DOB: 10-13-1960, 56 y.o.   MRN: [id number]  Called by nursing regarding patient with chest pain. He did not tolerate nitroglycerin paste secondary headache. Patient's vital signs are stable, EKG is unchanged. We'll start nitroglycerin titrated drip. Nurses to inform [facility] if patient has recurrent chest pain or EKG changes.

## 2016-08-03 NOTE — Progress Notes (Signed)
Pt c/o chest pain 10/10 after going to the bathroom. When this nurse got back with PRN morphine and SL nitro, pt states the pain had gone down to a 7/10 and was much better. 2 mg morphine given and one SL nitro. BP stable, pt states pain was 4/10 and was more of a numbness. Pt states she is nervous about a.m. procedure and that may be part of the problem. Pt requested sandwich tray and said she was going to try to get some sleep.

## 2016-08-03 NOTE — Progress Notes (Signed)
Patient intermittently complaining of severe chest pain radiating to neck, left arm, jaw and back at 10 out of 10. Attempted morphine IV. NO relief. Nitro drip titrated as needed. See MAR. MD notified. New orders placed. Will monitor.

## 2016-08-03 NOTE — Consult Note (Signed)
PULMONARY / CRITICAL CARE MEDICINE   Name: DENZIL BRISTOL MRN: [id number] DOB: 02-23-1961    ADMISSION DATE:  08/02/2016   CONSULTATION DATE:  08/03/2016  REFERRING MD:  Dr. Estanislado Pandy  CHIEF COMPLAINT:  Uncontrolled chest pain  HISTORY OF PRESENT ILLNESS:   This is 56 year old Caucasian female with a known history of coronary artery disease, poorly controlled type 2 diabetes mellitus, current smoker, hyperlipidemia, hypertension, MI s/p stenting to the RCA, CVA, and sleep apnea, admitted yesterday with complaints of acute onset chest pain. Patient describes pain as localized on the left side of her chest, 10 on 10 in intensity, worse with deep breaths and exertion, mildly alleviated with nitroglycerin, associated with nausea, dizziness, left arm numbness and radiates to her neck, upper back and chest. She was seen by cardiology and placed on a nitroglycerin infusion, heparin infusion, Plavix, statin, beta blocker and aspirin. She is due for a cardiac catheterization on Monday. PCCM was consulted for refractory chest pain and possible ICU observation and management.  PAST MEDICAL HISTORY :  She  has a past medical history of Anxiety; Arthritis; Bipolar disorder ([place]); CAD (coronary artery disease); Chronic bronchitis ([place]); Daily headache; Depression; GERD (gastroesophageal reflux disease); Heart murmur; High cholesterol; History of stomach ulcers; Hypertension; Insomnia; Kidney stones; Kidney stones; Migraine; Myocardial infarction ([place]) (2008; ~ 2012); Pneumonia; RLS (restless legs syndrome); Seizures ([place]); Sleep apnea; Stroke [facility]); Type II diabetes mellitus ([place]); and Urinary, incontinence, stress female.  PAST SURGICAL HISTORY: She  has a past surgical history that includes Tonsillectomy; Knee arthroscopy (Left); Extracorporeal shock wave lithotripsy (X 2); Dilation and curettage of uterus; Cesarean section (1984); Tubal ligation; Abdominal hysterectomy (1984); Coronary angioplasty with stent  (2008; ~ 2012); left heart catheterization with coronary angiogram (N/A, 06/15/2014); percutaneous coronary stent intervention (pci-s) (06/15/2014); Cardiac catheterization (N/A, 02/26/2015); and Cardiac catheterization (N/A, 02/26/2015).  Allergies  Allergen Reactions  . Lithium Other (See Comments)    Blood dyscrasia  . Tegretol [Carbamazepine] Other (See Comments)    Blood dyscrasia when on with Lithium  . Levaquin [Levofloxacin In D5w] Other (See Comments)    "Salty" sensation in mouth. "Hard time to explain it"  . Lodine [Etodolac] Other (See Comments)    Rash, N/V    No current facility-administered medications on file prior to encounter.    Current Outpatient Prescriptions on File Prior to Encounter  Medication Sig  . amLODipine (NORVASC) 2.5 MG tablet Take 1 tablet (2.5 mg total) by mouth daily.  [name] aspirin EC 81 MG EC tablet Take 1 tablet (81 mg total) by mouth daily.  [name] atorvastatin (LIPITOR) 40 MG tablet take 1 tablet by mouth once daily AT 6PM AS DIRECTED  . Blood Glucose Monitoring Suppl W/DEVICE KIT Test 1-2 times daily; E11.65 diagnosis  . clopidogrel (PLAVIX) 75 MG tablet Take 1 tablet (75 mg total) by mouth daily with breakfast. (Patient taking differently: Take 150 mg by mouth daily. )  . furosemide (LASIX) 20 MG tablet take 1 tablet by mouth once daily  . gabapentin (NEURONTIN) 300 MG capsule Take 3 capsules (900 mg total) by mouth 3 (three) times daily. (Patient taking differently: Take 900 mg by mouth 4 (four) times daily. )  . ibuprofen (ADVIL,MOTRIN) 800 MG tablet Take 800 mg by mouth every 6 (six) hours as needed.  [name] lisinopril (PRINIVIL,ZESTRIL) 40 MG tablet take 1 tablet by mouth once daily  . metoprolol (TOPROL-XL) 200 MG 24 hr tablet take 1 tablet by mouth once daily WITH OR IMMEDIATELY FOLLOWING A MEAL  .  oxyCODONE (OXY IR/ROXICODONE) 5 MG immediate release tablet Take 1 tablet (5 mg total) by mouth every 6 (six) hours as needed for moderate pain. (Patient taking  differently: Take 10 mg by mouth every 4 (four) hours as needed for moderate pain. )  . COMBIVENT RESPIMAT 20-100 MCG/ACT AERS respimat Inhale 1 puff into the lungs every 6 (six) hours as needed for wheezing or shortness of breath.  . Multiple Vitamins-Minerals (MULTIVITAMIN WITH MINERALS) tablet Take 1 tablet by mouth daily.  . nicotine (NICODERM CQ - DOSED IN MG/24 HOURS) 21 mg/24hr patch Place 1 patch (21 mg total) onto the skin daily. (Patient not taking: Reported on 08/02/2016)    FAMILY HISTORY:  Her indicated that her mother is deceased. She indicated that her father is deceased.    SOCIAL HISTORY: She  reports that she has been smoking Cigarettes.  She has a 35.00 pack-year smoking history. She has never used smokeless tobacco. She reports that she drinks alcohol. She reports that she uses drugs, including Marijuana.  REVIEW OF SYSTEMS:   Constitutional: Negative for fever and chills.  HENT: Negative for congestion and rhinorrhea.  Eyes: Negative for redness and visual disturbance.  Respiratory: Positive for shortness of breath but negative for wheezing.  Cardiovascular: Positive for chest pain, but negative palpitations.  Gastrointestinal: Negative  for nausea , vomiting and abdominal pain and  Loose stools Genitourinary: Negative for dysuria and urgency.  Endocrine: Denies polyuria, polyphagia and heat intolerance Musculoskeletal: Positive for generalized pain and rib cage pain.  Skin: Negative for pallor and wound.  Neurological: Negative for dizziness and headaches   SUBJECTIVE:    VITAL SIGNS: BP 116/73   Pulse 89   Temp 98.3 F (36.8 C)   Resp 18   Ht _0  (1.575 m)   Wt 190 lb (86.2 kg)   SpO2 96%   BMI 34.75 kg/m   HEMODYNAMICS:    VENTILATOR SETTINGS:    INTAKE / OUTPUT: I/O last 3 completed shifts: In: 365.7 [P.O.:240; I.V.:125.7] Out: 1950 [[id number]]  PHYSICAL EXAMINATION: General: Well-nourished, well-developed, in moderate to severe  distress Neuro: Alert and oriented 4. No focal deficits HEENT:  PERRLA, trachea midline, no JVD Cardiovascular: Rate and rhythm regular, S1, S2 audible, no murmur, regurg or gallop, no pericardial rub Lungs:  Work of breathing, bilateral airflow with breath sounds diminished in the bases bilaterally and mild expiratory wheezes Abdomen: Nontender, nondistended, normal bowel sounds Musculoskeletal:  No deformities, positive range of motion Skin:  Warm and dry  LABS:  BMET  Recent Labs Lab 08/01/16 2059 08/02/16 0532 08/02/16 0935  NA 136 136  --   K 3.7 3.6  --   CL 106 106  --   CO2 22 23  --   BUN 18 15  --   CREATININE 0.68 0.31* 0.56  GLUCOSE 496* 271*  --     Electrolytes  Recent Labs Lab 08/01/16 2059 08/02/16 0532  CALCIUM 9.6 8.5*    CBC  Recent Labs Lab 08/01/16 2059 08/02/16 0935 08/03/16 0158  WBC 10.9 10.5 9.9  HGB 14.1 13.1 13.2  HCT 42.3 38.5 39.3  PLT 272 238 228    Coag's  Recent Labs Lab 08/01/16 2059 08/02/16 0935  APTT  --  45*  INR 0.90  --     Sepsis Markers No results for input(s): LATICACIDVEN, PROCALCITON, O2SATVEN in the last 168 hours.  ABG No results for input(s): PHART, PCO2ART, PO2ART in the last 168 hours.  Liver Enzymes No results  for input(s): AST, ALT, ALKPHOS, BILITOT, ALBUMIN in the last 168 hours.  Cardiac Enzymes  Recent Labs Lab 08/02/16 0353 08/02/16 0935 08/02/16 1207  TROPONINI 0.09* 0.10* 0.10*    Glucose  Recent Labs Lab 08/02/16 0117 08/02/16 0801 08/02/16 1159 08/02/16 1641 08/02/16 2127 08/03/16 0732  GLUCAP 346* 237* 304* 195* 284* 302*    Imaging No results found.   STUDIES:  2-D echo from 08/02/2016 shows reserved left ventricular ejection fraction of 60-65%  CULTURES: None  ANTIBIOTICS: None  SIGNIFICANT EVENTS: 08/02/2016: ED with acute onset chest pain; likely unstable angina; due for cardiac catheterization  LINES/TUBES: Peripheral  IVs  DISCUSSION: 56 year old female with a known history of ischemic heart disease presenting with recurrent angina. No acute EKG changes. Troponin is currently at 0.1.   ASSESSMENT  Unstable angina Uncontrolled type 2 diabetes Uncontrolled hypertension. Current smoker. History of coronary artery disease status post MI History of hyperlipidemia  Plan Spoke with Dr. Rockey Situ. He will be in to see patient and determine need for cardiac catheterization. Continue plan of care per cardiology Keep patient in telemetry unit  Pain control with when necessary morphine and hydrocodone PCCM has nothing else to offer at this time. Hence, we will sign off   FAMILY  - Updates: No family at bedside  - Inter-disciplinary family meet or Palliative Care meeting due by:  day 7  Patient seen with Dr. Jefferson Fuel. He agrees with the above plan.  Magdalene S. [facility] ANP-BC Pulmonary and Critical Care Medicine [facility] Pager [phone] or [phone] 08/03/2016, 8:23 AM

## 2016-08-03 NOTE — Progress Notes (Signed)
[facility] - [place] at [facility]   PATIENT NAME: Jennifer [name]    MR#:  [id number]  DATE OF BIRTH:  08-23-1960  SUBJECTIVE:  Patient came in with chest pain and diaphoresis continued to have CP on and off all nite On nitro gtt. EKG late last nite showed flipped t waves in II and AVF  REVIEW OF SYSTEMS:   Review of Systems  Constitutional: Positive for diaphoresis. Negative for chills, fever and weight loss.  HENT: Negative for ear discharge, ear pain and nosebleeds.   Eyes: Negative for blurred vision, pain and discharge.  Respiratory: Negative for sputum production, shortness of breath, wheezing and stridor.   Cardiovascular: Positive for chest pain. Negative for palpitations, orthopnea and PND.  Gastrointestinal: Negative for abdominal pain, diarrhea, nausea and vomiting.  Genitourinary: Negative for frequency and urgency.  Musculoskeletal: Negative for back pain and joint pain.  Neurological: Negative for sensory change, speech change, focal weakness and weakness.  Psychiatric/Behavioral: Negative for depression and hallucinations. The patient is not nervous/anxious.    Tolerating Diet:yes Tolerating PT: not needed  DRUG ALLERGIES:   Allergies  Allergen Reactions  . Lithium Other (See Comments)    Blood dyscrasia  . Tegretol [Carbamazepine] Other (See Comments)    Blood dyscrasia when on with Lithium  . Levaquin [Levofloxacin In D5w] Other (See Comments)    "Salty" sensation in mouth. "Hard time to explain it"  . Lodine [Etodolac] Other (See Comments)    Rash, N/V    VITALS:  Blood pressure 116/73, pulse 89, temperature 98.3 F (36.8 C), resp. rate 18, height  (1.575 m), weight 86.2 kg (190 lb), SpO2 96 %.  PHYSICAL EXAMINATION:   Physical Exam  GENERAL:  56 y.o.-year-old patient lying in the bed with no acute distress. obese EYES: Pupils equal, round, reactive to light and accommodation. No scleral icterus. Extraocular muscles  intact.  HEENT: Head atraumatic, normocephalic. Oropharynx and nasopharynx clear.  NECK:  Supple, no jugular venous distention. No thyroid enlargement, no tenderness.  LUNGS: Normal breath sounds bilaterally, no wheezing, rales, rhonchi. No use of accessory muscles of respiration.  CARDIOVASCULAR: S1, S2 normal. No murmurs, rubs, or gallops.  ABDOMEN: Soft, nontender, nondistended. Bowel sounds present. No organomegaly or mass.  EXTREMITIES: No cyanosis, clubbing or edema b/l.    NEUROLOGIC: Cranial nerves II through XII are intact. No focal Motor or sensory deficits b/l.   PSYCHIATRIC:  patient is alert and oriented x 3.  SKIN: No obvious rash, lesion, or ulcer.   LABORATORY PANEL:  CBC  Recent Labs Lab 08/03/16 0158  WBC 9.9  HGB 13.2  HCT 39.3  PLT 228    Chemistries   Recent Labs Lab 08/02/16 0532 08/02/16 0935  NA 136  --   K 3.6  --   CL 106  --   CO2 23  --   GLUCOSE 271*  --   BUN 15  --   CREATININE 0.31* 0.56  CALCIUM 8.5*  --    Cardiac Enzymes  Recent Labs Lab 08/03/16 0739  TROPONINI 0.12*   RADIOLOGY:  Dg Chest 2 View  Result Date: 08/01/2016 CLINICAL DATA:  Acute onset of generalized chest pain and nausea. Initial encounter. EXAM: CHEST  2 VIEW COMPARISON:  Chest radiograph performed 02/23/2015 FINDINGS: The lungs are well-aerated. Minimal bibasilar atelectasis is noted. There is no evidence of pleural effusion or pneumothorax. The heart is normal in size; the mediastinal contour is within normal limits. No acute osseous abnormalities are  seen. The patient is status post right-sided rotator cuff repair. IMPRESSION: Minimal bibasilar atelectasis noted.  Lungs otherwise clear. Electronically Signed   By: Roanna Raider M.D.   On: 08/01/2016 21:26   ASSESSMENT AND PLAN:  56 year old woman with known coronary artery disease, previous stenting to the RCA, last ischemic evaluation in 2016, poor control diabetes with A1c of 13, continued smoking who presents  with unstable angina, minimally elevated troponin.  1. Unstable angina Minimal elevation of her troponin in the setting of severe symptoms concerning for ischemia, known disease, multiple risk factors are reported controlled including diabetes and continued smoking. Echocardiogram with no significant wall motion abnormalities, ejection fraction greater than 55% -For now would continue heparin infusion, aspirin, Plavix, statin, beta blocker -Troponin 0.03--- 0.09--0.1--- 0.1 -Repeat EKG last nite showed flipped t waves in [place] -Cardiac consultation with Dr. Mariah Milling appreciated. Cath in am.  2) poorly controlled diabetes with circulatory complication Dietary and medication noncompliance Outpatient follow-up with primary care, may benefit from referral to endocrine  3) smoker Continues to smoke one pack per day Previously tried Chantix, did not remember if she had side effects Patient advised smoking cessation 4) hypertension Continue current medications for now May need to hold lisinopril if catheterization performed on Monday  Case discussed with Care Management/Social Worker. Management plans discussed with the patient, family and they are in agreement.  CODE STATUS: Full DVT Prophylaxis: Heparin drip  TOTAL TIME TAKING CARE OF THIS PATIENT: 30 minutes.  >50% time spent on counselling and coordination of care  POSSIBLE D/C IN DAY1-2 S, DEPENDING ON CLINICAL CONDITION.  Note: This dictation was prepared with Dragon dictation along with smaller phrase technology. Any transcriptional errors that result from this process are unintentional.  Hiromi Knodel M.D on 08/03/2016 at 10:27 AM  Between 7am to 6pm - Pager - [phone]  After 6pm go to [email or web address] - Social research officer, government  [facility]  Office  [phone]  CC: Primary care physician; Carollee Leitz, NP

## 2016-08-03 NOTE — Progress Notes (Signed)
Patient complaining of severe chest pain radiating to neck and left arm./ Given sublingual nitro x2 for relief. Patient complained of repeat symptoms again. Nitro x2 given. EKG done. MD notified. Orders given for nitro drip. Symptoms occurred again. Nitro titrated up until relief. BP stable. No adverse effects. Patient resting in bed. Patient assisted to bathroom. Complained of severe chest pain symptoms once back in bed. MD notified. Orders given. Will monitor.

## 2016-08-04 ENCOUNTER — Encounter: Payer: Self-pay | Admitting: Cardiovascular Disease

## 2016-08-04 ENCOUNTER — Encounter
Admission: EM | Disposition: A | Discharge status: Home / Self Care | Payer: Self-pay | Source: Home / Self Care | Attending: Internal Medicine

## 2016-08-04 DIAGNOSIS — I25110 Atherosclerotic heart disease of native coronary artery with unstable angina pectoris: Secondary | ICD-10-CM

## 2016-08-04 HISTORY — PX: LEFT HEART CATH: CATH118248

## 2016-08-04 HISTORY — PX: CORONARY STENT INTERVENTION: CATH118234

## 2016-08-04 LAB — CBC
HCT: 38.9 % (ref 35.0–47.0)
Hemoglobin: 13.1 g/dL (ref 12.0–16.0)
MCH: 31.3 pg (ref 26.0–34.0)
MCHC: 33.7 g/dL (ref 32.0–36.0)
MCV: 92.7 fL (ref 80.0–100.0)
PLATELETS: 246 10*3/uL (ref 150–440)
RBC: 4.2 MIL/uL (ref 3.80–5.20)
RDW: 14 % (ref 11.5–14.5)
WBC: 10.6 10*3/uL (ref 3.6–11.0)

## 2016-08-04 LAB — GLUCOSE, CAPILLARY
Glucose-Capillary: 321 mg/dL — ABNORMAL HIGH (ref 65–99)
Glucose-Capillary: 343 mg/dL — ABNORMAL HIGH (ref 65–99)

## 2016-08-04 LAB — CREATININE, SERUM: Creatinine, Ser: 0.57 mg/dL (ref 0.44–1.00)

## 2016-08-04 LAB — POCT ACTIVATED CLOTTING TIME: Activated Clotting Time: 252 seconds

## 2016-08-04 LAB — HEPARIN LEVEL (UNFRACTIONATED): HEPARIN UNFRACTIONATED: 0.36 [IU]/mL (ref 0.30–0.70)

## 2016-08-04 SURGERY — LEFT HEART CATH
Anesthesia: Moderate Sedation

## 2016-08-04 MED ORDER — FENTANYL CITRATE (PF) 100 MCG/2ML IJ SOLN
INTRAMUSCULAR | Status: AC
  Filled 2016-08-04: qty 2

## 2016-08-04 MED ORDER — LIDOCAINE HCL (PF) 1 % IJ SOLN
INTRAMUSCULAR | Status: DC | PRN
  Administered 2016-08-04: 3 mL via SUBCUTANEOUS

## 2016-08-04 MED ORDER — HEPARIN (PORCINE) IN NACL 2-0.9 UNIT/ML-% IJ SOLN
INTRAMUSCULAR | Status: AC
  Filled 2016-08-04: qty 500

## 2016-08-04 MED ORDER — ASPIRIN 81 MG PO CHEW
CHEWABLE_TABLET | ORAL | Status: DC | PRN
  Administered 2016-08-04: 324 mg via ORAL

## 2016-08-04 MED ORDER — LABETALOL HCL 5 MG/ML IV SOLN
INTRAVENOUS | Status: AC
  Filled 2016-08-04: qty 4

## 2016-08-04 MED ORDER — SODIUM CHLORIDE 0.9 % WEIGHT BASED INFUSION
1.0000 mL/kg/h | INTRAVENOUS | Status: DC
Start: 2016-08-05 — End: 2016-08-04
  Administered 2016-08-04: 1 mL/kg/h via INTRAVENOUS

## 2016-08-04 MED ORDER — SODIUM CHLORIDE 0.9 % WEIGHT BASED INFUSION
3.0000 mL/kg/h | INTRAVENOUS | Status: DC
  Administered 2016-08-04: 3 mL/kg/h via INTRAVENOUS

## 2016-08-04 MED ORDER — MIDAZOLAM HCL 2 MG/2ML IJ SOLN
INTRAMUSCULAR | Status: AC
  Filled 2016-08-04: qty 2

## 2016-08-04 MED ORDER — ALPRAZOLAM 0.5 MG PO TABS
ORAL_TABLET | ORAL | Status: AC
  Filled 2016-08-04: qty 1

## 2016-08-04 MED ORDER — VERAPAMIL HCL 2.5 MG/ML IV SOLN
INTRAVENOUS | Status: AC
  Filled 2016-08-04: qty 2

## 2016-08-04 MED ORDER — ASPIRIN 81 MG PO CHEW: 81.0000 mg | CHEWABLE_TABLET | ORAL | Status: DC

## 2016-08-04 MED ORDER — ASPIRIN 81 MG PO CHEW
CHEWABLE_TABLET | ORAL | Status: AC
  Filled 2016-08-04: qty 4

## 2016-08-04 MED ORDER — HYDRALAZINE HCL 20 MG/ML IJ SOLN
INTRAMUSCULAR | Status: DC | PRN
  Administered 2016-08-04: 10 mg via INTRAVENOUS

## 2016-08-04 MED ORDER — HEPARIN SODIUM (PORCINE) 1000 UNIT/ML IJ SOLN
INTRAMUSCULAR | Status: DC | PRN
  Administered 2016-08-04: 4500 [IU] via INTRAVENOUS
  Administered 2016-08-04: 4000 [IU] via INTRAVENOUS

## 2016-08-04 MED ORDER — MIDAZOLAM HCL 2 MG/2ML IJ SOLN
INTRAMUSCULAR | Status: DC | PRN
  Administered 2016-08-04: 1 mg via INTRAVENOUS
  Administered 2016-08-04: 2 mg via INTRAVENOUS
  Administered 2016-08-04: 09:00:00

## 2016-08-04 MED ORDER — MIDAZOLAM HCL 2 MG/2ML IJ SOLN: 2.0000 mg | Freq: Once | INTRAMUSCULAR | Status: DC

## 2016-08-04 MED ORDER — HEPARIN SODIUM (PORCINE) 1000 UNIT/ML IJ SOLN
INTRAMUSCULAR | Status: AC
  Filled 2016-08-04: qty 1

## 2016-08-04 MED ORDER — IOPAMIDOL (ISOVUE-300) INJECTION 61%
INTRAVENOUS | Status: DC | PRN
  Administered 2016-08-04: 135 mL via INTRAVENOUS

## 2016-08-04 MED ORDER — HYDRALAZINE HCL 20 MG/ML IJ SOLN
INTRAMUSCULAR | Status: AC
  Filled 2016-08-04: qty 1

## 2016-08-04 MED ORDER — ALPRAZOLAM 0.5 MG PO TABS: 0.2500 mg | ORAL_TABLET | Freq: Once | ORAL | Status: DC

## 2016-08-04 MED ORDER — MORPHINE SULFATE (PF) 2 MG/ML IV SOLN
INTRAVENOUS | Status: AC
  Filled 2016-08-04: qty 1

## 2016-08-04 MED ORDER — FENTANYL CITRATE (PF) 100 MCG/2ML IJ SOLN
INTRAMUSCULAR | Status: DC | PRN
  Administered 2016-08-04 (×3): 50 ug via INTRAVENOUS

## 2016-08-04 MED ORDER — ENOXAPARIN SODIUM 40 MG/0.4ML ~~LOC~~ SOLN
40.0000 mg | SUBCUTANEOUS | Status: DC
  Administered 2016-08-05: 40 mg via SUBCUTANEOUS
  Filled 2016-08-04 (×2): qty 0.4

## 2016-08-04 MED ORDER — NITROGLYCERIN 1 MG/10 ML FOR IR/CATH LAB
INTRA_ARTERIAL | Status: DC | PRN
  Administered 2016-08-04: 400 mL via INTRACORONARY

## 2016-08-04 MED ORDER — SODIUM CHLORIDE 0.9% FLUSH
3.0000 mL | Freq: Two times a day (BID) | INTRAVENOUS | Status: DC
  Administered 2016-08-04: 3 mL via INTRAVENOUS

## 2016-08-04 MED ORDER — AMLODIPINE BESYLATE 5 MG PO TABS
5.0000 mg | ORAL_TABLET | Freq: Every day | ORAL | Status: DC
  Administered 2016-08-04 – 2016-08-05 (×2): 5 mg via ORAL
  Filled 2016-08-04 (×2): qty 1

## 2016-08-04 MED ORDER — SODIUM CHLORIDE 0.9 % WEIGHT BASED INFUSION: 1.0000 mL/kg/h | INTRAVENOUS | Status: DC

## 2016-08-04 MED ORDER — LABETALOL HCL 5 MG/ML IV SOLN: 10.0000 mg | INTRAVENOUS | Status: AC | PRN

## 2016-08-04 MED ORDER — CLOPIDOGREL BISULFATE 300 MG PO TABS
ORAL_TABLET | ORAL | Status: AC
  Filled 2016-08-04: qty 1

## 2016-08-04 MED ORDER — SODIUM CHLORIDE 0.9% FLUSH: 3.0000 mL | INTRAVENOUS | Status: DC | PRN

## 2016-08-04 MED ORDER — LIDOCAINE HCL (PF) 1 % IJ SOLN
INTRAMUSCULAR | Status: AC
  Filled 2016-08-04: qty 30

## 2016-08-04 MED ORDER — SODIUM CHLORIDE 0.9 % IV SOLN: 250.0000 mL | INTRAVENOUS | Status: DC | PRN

## 2016-08-04 MED ORDER — ALPRAZOLAM 0.25 MG PO TABS
0.2500 mg | ORAL_TABLET | Freq: Three times a day (TID) | ORAL | Status: DC | PRN
  Administered 2016-08-04: 0.25 mg via ORAL
  Filled 2016-08-04 (×2): qty 1

## 2016-08-04 MED ORDER — CLOPIDOGREL BISULFATE 75 MG PO TABS
ORAL_TABLET | ORAL | Status: DC | PRN
  Administered 2016-08-04: 300 mg via ORAL

## 2016-08-04 MED ORDER — NITROGLYCERIN 5 MG/ML IV SOLN
INTRAVENOUS | Status: AC
  Filled 2016-08-04: qty 10

## 2016-08-04 MED ORDER — NITROGLYCERIN 0.4 MG SL SUBL
SUBLINGUAL_TABLET | SUBLINGUAL | Status: AC
  Filled 2016-08-04: qty 1

## 2016-08-04 MED ORDER — ALPRAZOLAM 0.5 MG PO TABS
0.2500 mg | ORAL_TABLET | Freq: Once | ORAL | Status: AC
  Administered 2016-08-04: 0.25 mg via ORAL

## 2016-08-04 MED ORDER — LABETALOL HCL 5 MG/ML IV SOLN
INTRAVENOUS | Status: DC | PRN
  Administered 2016-08-04: 20 mg via INTRAVENOUS

## 2016-08-04 SURGICAL SUPPLY — 14 items
BALLN TREK RX 2.75X12 (BALLOONS) ×4
BALLN ~~LOC~~ TREK RX 3.75X12 (BALLOONS) ×4
BALLOON TREK RX 2.75X12 (BALLOONS) ×2 IMPLANT
BALLOON ~~LOC~~ TREK RX 3.75X12 (BALLOONS) ×2 IMPLANT
CATH OPTITORQUE JACKY 4.0 5F (CATHETERS) ×4 IMPLANT
CATH VISTA GUIDE 6FR JR4 (CATHETERS) ×4 IMPLANT
DEVICE INFLAT 30 PLUS (MISCELLANEOUS) ×4 IMPLANT
DEVICE RAD TR BAND REGULAR (VASCULAR PRODUCTS) ×4 IMPLANT
GLIDESHEATH SLEND SS 6F .021 (SHEATH) ×4 IMPLANT
KIT MANI 3VAL PERCEP (MISCELLANEOUS) ×4 IMPLANT
PACK CARDIAC CATH (CUSTOM PROCEDURE TRAY) ×4 IMPLANT
STENT RESOLUTE ONYX 3.5X30 (Permanent Stent) ×4 IMPLANT
WIRE ROSEN-J .035X260CM (WIRE) ×4 IMPLANT
WIRE RUNTHROUGH .014X180CM (WIRE) ×4 IMPLANT

## 2016-08-04 NOTE — OR Nursing (Signed)
Received from Cath lab, Anxious, "I'm having a panic attack.Jennifer KitchenMarland KitchenMarland KitchenI need my medicine" HR and respirations elevated as well as BP... MD aware and medications given

## 2016-08-04 NOTE — H&P (View-Only) (Signed)
Progress Note  Patient Name: Jennifer [name] Date of Encounter: 08/03/2016  Primary Cardiologist: Mariah Milling  Subjective   Received phone call from nurse practitioner in the ICU this morning By her report, patient had severe stuttering chest pain overnight Patient reports having pain on any movement such as going to the bathroom Hospitalist manage the phone calls overnight, started on nitroglycerin infusion this morning EKG late last night with nonspecific T-wave abnormality this morning with repeat EKG showing new clear T-wave inversions in lead 3 and aVF  This morning she is resting comfortably, sleeping, received morphine and on 35 g nitroglycerin infusion Reports having stuttering chest pain through yesterday, severe overnight TNT of 0.12 Previously 0.1   Inpatient Medications    Scheduled Meds: . aspirin EC  81 mg Oral Daily  . atorvastatin  40 mg Oral q1800  . clopidogrel  75 mg Oral Q breakfast  . gabapentin  900 mg Oral TID  . insulin aspart  0-15 Units Subcutaneous TID WC  . insulin aspart  0-5 Units Subcutaneous QHS  . insulin aspart  4 Units Subcutaneous TID WC  . insulin glargine  20 Units Subcutaneous Daily  . lisinopril  40 mg Oral Daily  . metoprolol  200 mg Oral Daily  . multivitamin with minerals  1 tablet Oral Daily  . nicotine  21 mg Transdermal Daily   Continuous Infusions: . heparin 1,100 Units/hr (08/03/16 0357)  . nitroGLYCERIN 30 mcg/min (08/03/16 1200)   PRN Meds: acetaminophen, cyclobenzaprine, HYDROcodone-acetaminophen, ibuprofen, ipratropium-albuterol, morphine injection, nitroGLYCERIN, ondansetron (ZOFRAN) IV   Vital Signs    Vitals:   08/03/16 1000 08/03/16 1138 08/03/16 1200 08/03/16 1300  BP: 115/81 93/68 107/71 106/70  Pulse: 84 77 71 62  Resp:  18    Temp:  97.5 F (36.4 C)    TempSrc:  Oral    SpO2:  94%    Weight:      Height:        Intake/Output Summary (Last 24 hours) at 08/03/16 1412 Last data filed at 08/03/16  1057  Gross per 24 hour  Intake           365.69 ml  Output             3450 ml  Net         -3084.31 ml   Filed Weights   08/01/16 2056  Weight: 190 lb (86.2 kg)    Telemetry    Normal sinus rhythm - Personally Reviewed  ECG    Normal sinus rhythm with T-wave inversions 3 and aVF- Personally Reviewed  Physical Exam    GEN: No acute distress. , Obese, sleeping when I first walked in Neck: No JVD  Cardiac: RRR, no murmurs, rubs, or gallops.  Radials/DP/PT 2+ and equal bilaterally.  Respiratory:  Clear to auscultation bilaterally. GI: Soft, nontender, non-distended  MS: no deformity; no edema Neuro:  Alert and oriented x 3  Labs    Chemistry Recent Labs Lab 08/01/16 2059 08/02/16 0532 08/02/16 0935  NA 136 136  --   K 3.7 3.6  --   CL 106 106  --   CO2 22 23  --   GLUCOSE 496* 271*  --   BUN 18 15  --   CREATININE 0.68 0.31* 0.56  CALCIUM 9.6 8.5*  --   GFRNONAA >60 >60 >60  GFRAA >60 >60 >60  ANIONGAP 8 7  --      Hematology Recent Labs Lab 08/01/16 2059 08/02/16  0935 08/03/16 0158  WBC 10.9 10.5 9.9  RBC 4.52 4.22 4.21  HGB 14.1 13.1 13.2  HCT 42.3 38.5 39.3  MCV 93.6 91.2 93.3  MCH 31.2 31.0 31.5  MCHC 33.3 34.0 33.7  RDW 14.1 13.6 14.0  PLT 272 238 228    Cardiac Enzymes Recent Labs Lab 08/02/16 0353 08/02/16 0935 08/02/16 1207 08/03/16 0739  TROPONINI 0.09* 0.10* 0.10* 0.12*   No results for input(s): TROPIPOC in the last 168 hours.   BNPNo results for input(s): BNP, PROBNP in the last 168 hours.   DDimer No results for input(s): DDIMER in the last 168 hours.   Radiology    Dg Chest 2 View  Result Date: 08/01/2016 CLINICAL DATA:  Acute onset of generalized chest pain and nausea. Initial encounter. EXAM: CHEST  2 VIEW COMPARISON:  Chest radiograph performed 02/23/2015 FINDINGS: The lungs are well-aerated. Minimal bibasilar atelectasis is noted. There is no evidence of pleural effusion or pneumothorax. The heart is normal in size;  the mediastinal contour is within normal limits. No acute osseous abnormalities are seen. The patient is status post right-sided rotator cuff repair. IMPRESSION: Minimal bibasilar atelectasis noted.  Lungs otherwise clear. Electronically Signed   By: Jeffery  Chang M.D.   On: 08/01/2016 21:26    Cardiac Studies     Patient Profile     56 y.o. female with known coronary artery disease, previous stenting to the RCA, last ischemic evaluation in 2016, poor control diabetes with A1c of 13, continued smoking who presents with unstable angina, minimally elevated troponin. Stuttering chest pain overnight  Assessment & Plan    1. Unstable angina Minimal elevation of her troponin in the setting of severe symptoms concerning for ischemia, known disease, numerous risk factors:  including diabetes and continued smoking. Echocardiogram with no significant wall motion abnormalities, ejection fraction greater than 55%  cardiac catheterization Scheduled for first case tomorrow given the unstable nature of her symptoms, risk profile, mild elevation of her troponin  would continue heparin infusion, aspirin, Plavix, statin, beta blocker For recurrent pain, would give sublingual nitroglycerin and morphine, could also start IIb IIIa inhibitor I have reviewed the risks, indications, and alternatives to cardiac catheterization, possible angioplasty, and stenting with the patient. Risks include but are not limited to bleeding, infection, vascular injury, stroke, myocardial infection, arrhythmia, kidney injury, radiation-related injury in the case of prolonged fluoroscopy use, emergency cardiac surgery, and death. The patient understands the risks of serious complication is 1-2 in 1000 with diagnostic cardiac cath and 1-2% or less with angioplasty/stenting.   2) poorly controlled diabetes with circulatory complication Dietary and medication noncompliance  3) smoker Continues to smoke one pack per day Previously  tried Chantix, did not remember if she had side effects Vapor cigarettes do not seem to work, unable to afford nicotine patches   4) hypertension Continue current medications for now May need to hold lisinopril if catheterization performed on Monday   Total encounter time more than 35 minutes  Greater than 50% was spent in counseling and coordination of care with the patient   Signed, Timothy Gollan, MD  08/03/2016, 2:12 PM   

## 2016-08-04 NOTE — Progress Notes (Signed)
Pt reported "12/10" pain. PRN morphine and two SL nitro given as well as nitro drip titrated to 12.0 rate, 40 mcg/min. Pt reports pain 5/10. Pt having full conversation the entire time, no c/o SOB. Pt describes pain as crushing then turning to soreness across chest. Pt appears very anxious, MD Pyreddy made aware. PRN 0.25 Xanax ordered Q8. Pt reports no pain at this time. Will continue to monitor.

## 2016-08-04 NOTE — Progress Notes (Signed)
[facility] - [place] at [facility]   PATIENT NAME: Jennifer [name]    MR#:  [id number]  DATE OF BIRTH:  24-Nov-1960  SUBJECTIVE:  Patient came in with chest pain and diaphoresis continued to have CP on and off all nite On nitro gtt.  Just got back from cath. Pt is c/o headache  REVIEW OF SYSTEMS:   Review of Systems  Constitutional: Positive for diaphoresis. Negative for chills, fever and weight loss.  HENT: Negative for ear discharge, ear pain and nosebleeds.   Eyes: Negative for blurred vision, pain and discharge.  Respiratory: Negative for sputum production, shortness of breath, wheezing and stridor.   Cardiovascular: Positive for chest pain. Negative for palpitations, orthopnea and PND.  Gastrointestinal: Negative for abdominal pain, diarrhea, nausea and vomiting.  Genitourinary: Negative for frequency and urgency.  Musculoskeletal: Negative for back pain and joint pain.  Neurological: Negative for sensory change, speech change, focal weakness and weakness.  Psychiatric/Behavioral: Negative for depression and hallucinations. The patient is not nervous/anxious.    Tolerating Diet:yes Tolerating PT: not needed  DRUG ALLERGIES:   Allergies  Allergen Reactions  . Lithium Other (See Comments)    Blood dyscrasia  . Tegretol [Carbamazepine] Other (See Comments)    Blood dyscrasia when on with Lithium  . Levaquin [Levofloxacin In D5w] Other (See Comments)    "Salty" sensation in mouth. "Hard time to explain it"  . Lodine [Etodolac] Other (See Comments)    Rash, N/V    VITALS:  Blood pressure (!) 148/97, pulse 98, temperature 98 F (36.7 C), temperature source Oral, resp. rate 16, height  (1.575 m), weight 92.9 kg (204 lb 12.8 oz), SpO2 92 %.  PHYSICAL EXAMINATION:   Physical Exam  GENERAL:  56 y.o.-year-old patient lying in the bed with no acute distress. obese EYES: Pupils equal, round, reactive to light and accommodation. No scleral  icterus. Extraocular muscles intact.  HEENT: Head atraumatic, normocephalic. Oropharynx and nasopharynx clear.  NECK:  Supple, no jugular venous distention. No thyroid enlargement, no tenderness.  LUNGS: Normal breath sounds bilaterally, no wheezing, rales, rhonchi. No use of accessory muscles of respiration.  CARDIOVASCULAR: S1, S2 normal. No murmurs, rubs, or gallops.  ABDOMEN: Soft, nontender, nondistended. Bowel sounds present. No organomegaly or mass.  EXTREMITIES: No cyanosis, clubbing or edema b/l.    NEUROLOGIC: Cranial nerves II through XII are intact. No focal Motor or sensory deficits b/l.   PSYCHIATRIC:  patient is alert and oriented x 3.  SKIN: No obvious rash, lesion, or ulcer.   LABORATORY PANEL:  CBC  Recent Labs Lab 08/04/16 0427  WBC 10.6  HGB 13.1  HCT 38.9  PLT 246    Chemistries   Recent Labs Lab 08/02/16 0532  08/04/16 1330  NA 136  --   --   K 3.6  --   --   CL 106  --   --   CO2 23  --   --   GLUCOSE 271*  --   --   BUN 15  --   --   CREATININE 0.31*  < > 0.57  CALCIUM 8.5*  --   --   < > = values in this interval not displayed. Cardiac Enzymes  Recent Labs Lab 08/03/16 0739  TROPONINI 0.12*   RADIOLOGY:  No results found. ASSESSMENT AND PLAN:  56 year old woman with known coronary artery disease, previous stenting to the RCA, last ischemic evaluation in 2016, poor control diabetes with A1c of 13, continued  smoking who presents with unstable angina, minimally elevated troponin.  1. Unstable angina Minimal elevation of her troponin in the setting of severe symptoms concerning for ischemia, known disease, multiple risk factors are reported controlled including diabetes and continued smoking. Echocardiogram with no significant wall motion abnormalities, ejection fraction greater than 55% -For now would continue heparin infusion, aspirin, Plavix, statin, beta blocker -Troponin 0.03--- 0.09--0.1--- 0.1 -Repeat EKG last nite showed flipped t  waves in [place] -Cardiac consultation with Dr. Mariah Milling appreciated.  -s/p Cath today  Showed Severe one-vessel coronary artery disease affecting the right coronary artery with multiple previous stenting. There seems to be a plaque rupture within the proximal to mid stent which is the culprit for unstable angina. Stable disease in the left circumflex.. Normal LV systolic function and mildly elevated left ventricular end-diastolic pressure. Successful angioplasty and drug-eluting stent placement to the proximal/mid right coronary artery within the previously placed stent. There was 60% disease in the mid to distal segment which was left to be treated medically. -wean off nitro gtt  2) poorly controlled diabetes with circulatory complication Dietary and medication noncompliance Outpatient follow-up with primary care, may benefit from referral to endocrine  3) smoker Continues to smoke one pack per day Previously tried Chantix, did not remember if she had side effects Patient advised smoking cessation  4) hypertension Continue current medications for now May need to hold lisinopril if catheterization performed on Monday  Case discussed with Care Management/Social Worker. Management plans discussed with the patient, family and they are in agreement.  CODE STATUS: Full DVT Prophylaxis: Heparin drip  TOTAL TIME TAKING CARE OF THIS PATIENT: 30 minutes.  >50% time spent on counselling and coordination of care  POSSIBLE D/C IN DAY1-2 S, DEPENDING ON CLINICAL CONDITION.  Note: This dictation was prepared with Dragon dictation along with smaller phrase technology. Any transcriptional errors that result from this process are unintentional.  Mahamadou Weltz M.D on 08/04/2016 at 1:52 PM  Between 7am to 6pm - Pager - [phone]  After 6pm go to [email or web address] - Social research officer, government  [facility]  Office  [phone]  CC: Primary care physician; Carollee Leitz, NP

## 2016-08-04 NOTE — Interval H&P Note (Signed)
Cath Lab Visit (complete for each Cath Lab visit)  Clinical Evaluation Leading to the Procedure:   ACS: Yes.    Non-ACS:  n/a    History and Physical Interval Note:  08/04/2016 7:48 AM  Jennifer [name]  has presented today for surgery, with the diagnosis of unstable angina  The various methods of treatment have been discussed with the patient and family. After consideration of risks, benefits and other options for treatment, the patient has consented to  Procedure(s): Left Heart Cath poss PCI (Bilateral) as a surgical intervention .  The patient's history has been reviewed, patient examined, no change in status, stable for surgery.  I have reviewed the patient's chart and labs.  Questions were answered to the patient's satisfaction.     [name]

## 2016-08-04 NOTE — Progress Notes (Addendum)
Pt reports 9/10 CP. BP 191/91. Nitro drip titrated to 10.5 rate, 35 mcg/min. BP after 5 minutes 140/84 pain 5/10. Pt having full conversation, no guarding or SOB, smiling and joking with staff. Pt states it started after going to the bathroom which is also when her earlier chest pain started. Will continue to monitor.

## 2016-08-04 NOTE — Progress Notes (Signed)
Pt returned to room from cath lab. Rt wrist access clean dry and intact. Pt complaining of headache. Am meds given. Will wean off nitro gtt as ordered. Up to bsc voiding large amts. Med for head ache.

## 2016-08-04 NOTE — Progress Notes (Signed)
ANTICOAGULATION CONSULT NOTE - Follow Up Consult  Pharmacy Consult for Heparin Drip Indication: chest pain/ACS       Allergies  Allergen Reactions  . Lithium Other (See Comments)    Blood dyscrasia  . Tegretol [Carbamazepine] Other (See Comments)    Blood dyscrasia when on with Lithium  . Levaquin [Levofloxacin In D5w] Other (See Comments)    "Salty" sensation in mouth. "Hard time to explain it"  . Lodine [Etodolac] Other (See Comments)    Rash, N/V    Patient Measurements: Height:  (157.5 cm) Weight: 190 lb (86.2 kg) IBW/kg (Calculated) : 50.1 Heparin Dosing Weight: 69.7 kg  Vital Signs: Temp: 98.3 F (36.8 C) (04/15 0428) BP: 116/73 (04/15 0815) Pulse Rate: 89 (04/15 0815)  Labs:  Recent Labs (last 2 labs)    Recent Labs  08/01/16 2059  08/02/16 0532  08/02/16 0935 08/02/16 1207 08/02/16 1954 08/03/16 0158 08/03/16 0739  HGB 14.1  --   --   --  13.1  --   --  13.2  --   HCT 42.3  --   --   --  38.5  --   --  39.3  --   PLT 272  --   --   --  238  --   --  228  --   APTT  --   --   --   --  45*  --   --   --   --   LABPROT 12.1  --   --   --   --   --   --   --   --   INR 0.90  --   --   --   --   --   --   --   --   HEPARINUNFRC  --   --   --   < > 0.33 0.19* 0.40 0.30 0.36  CREATININE 0.68  --  0.31*  --  0.56  --   --   --   --   TROPONINI <0.03  < >  --   --  0.10* 0.10*  --   --  0.12*  < > = values in this interval not displayed.    Estimated Creatinine Clearance: 82.8 mL/min (by C-G formula based on SCr of 0.56 mg/dL).  Assessment: Patient admitted w/ CP and trops 0.09 Being started on heparin drip  Goal of Therapy: Heparin level 0.3-0.7 units/ml Monitor platelets by anticoagulation protocol: Yes  Plan: Give 4000units bolus x 1 Will start heparin drip @ 850 unit/hr and will check anti-Xa 4/14 @ 1200 Baseline labs ordered. Will continue to monitor CBC and signs/symptoms of bleeding.  4/14: HL@ 1207=0.19.   Will give Heparin bolus of 2100 units x 1 and increase drip to 1050 units/hr. Will recheck HL at 2000.  4/14 1954 HL therapeutic x 1. Continue current rate. Will recheck HL in 6 hours.  4/15 @ 0200 HL: 0.30 therapeutic on lower end of range; however, went down by 0.1, will increase rate to 1100 units/hr and recheck HL @ 0800.   4/15 HL@ 0739= 0.36. Will continue current drip rate and f/u HL/CBC with am labs.  4/16 HL @ 0427 0.36 therapeutic. Will continue current rate and will recheck w/ am labs.  Thank you for this consult.  Thomasene Ripple, PharmD, BCPS Clinical Pharmacist 08/04/2016

## 2016-08-05 DIAGNOSIS — Z720 Tobacco use: Secondary | ICD-10-CM

## 2016-08-05 DIAGNOSIS — E785 Hyperlipidemia, unspecified: Secondary | ICD-10-CM

## 2016-08-05 LAB — BASIC METABOLIC PANEL
ANION GAP: 9 (ref 5–15)
BUN: 16 mg/dL (ref 6–20)
CALCIUM: 9.2 mg/dL (ref 8.9–10.3)
CHLORIDE: 99 mmol/L — AB (ref 101–111)
CO2: 26 mmol/L (ref 22–32)
CREATININE: 0.65 mg/dL (ref 0.44–1.00)
GFR calc non Af Amer: >60 mL/min (ref >60)
Glucose, Bld: 450 mg/dL — ABNORMAL HIGH (ref 65–99)
Potassium: 4.2 mmol/L (ref 3.5–5.1)
SODIUM: 134 mmol/L — AB (ref 135–145)

## 2016-08-05 LAB — CBC
HCT: 41.9 % (ref 35.0–47.0)
HEMOGLOBIN: 13.6 g/dL (ref 12.0–16.0)
MCH: 30.3 pg (ref 26.0–34.0)
MCHC: 32.5 g/dL (ref 32.0–36.0)
MCV: 93.1 fL (ref 80.0–100.0)
Platelets: 241 10*3/uL (ref 150–440)
RBC: 4.5 MIL/uL (ref 3.80–5.20)
RDW: 13.9 % (ref 11.5–14.5)
WBC: 11.4 10*3/uL — AB (ref 3.6–11.0)

## 2016-08-05 LAB — GLUCOSE, CAPILLARY
GLUCOSE-CAPILLARY: 360 mg/dL — AB (ref 65–99)
GLUCOSE-CAPILLARY: 293 mg/dL — AB (ref 65–99)

## 2016-08-05 MED ORDER — OXYCODONE HCL 5 MG PO TABS: 5.0000 mg | ORAL_TABLET | Freq: Four times a day (QID) | ORAL | Status: DC | PRN

## 2016-08-05 MED ORDER — NITROGLYCERIN 0.4 MG SL SUBL: 0.4000 mg | SUBLINGUAL_TABLET | SUBLINGUAL | 2 refills | Status: DC | PRN

## 2016-08-05 MED ORDER — INSULIN GLARGINE 100 UNIT/ML ~~LOC~~ SOLN: 50.0000 [IU] | Freq: Every day | SUBCUTANEOUS | Status: DC

## 2016-08-05 NOTE — Progress Notes (Signed)
Progress Note  Patient Name: Jennifer [name] Date of Encounter: 08/05/2016  Primary Cardiologist: Judie Petit. Kirke Corin, MD   Subjective   No recurrent angina.  She has had some back and jaw pain - this seems to be more chronic - on Butrans patch @ home.  Has ambulated some in room w/o chest pain or dyspnea.  Eager to go home.  Inpatient Medications    Scheduled Meds: . amLODipine  5 mg Oral Daily  . aspirin EC  81 mg Oral Daily  . atorvastatin  40 mg Oral q1800  . clopidogrel  75 mg Oral Q breakfast  . enoxaparin (LOVENOX) injection  40 mg Subcutaneous Q24H  . gabapentin  900 mg Oral TID  . insulin aspart  0-15 Units Subcutaneous TID WC  . insulin aspart  0-5 Units Subcutaneous QHS  . insulin aspart  4 Units Subcutaneous TID WC  . insulin glargine  20 Units Subcutaneous Daily  . lisinopril  40 mg Oral Daily  . metoprolol  200 mg Oral Daily  . multivitamin with minerals  1 tablet Oral Daily  . nicotine  21 mg Transdermal Daily  . sodium chloride flush  3 mL Intravenous Q12H   Continuous Infusions: . sodium chloride     PRN Meds: sodium chloride, acetaminophen, ALPRAZolam, cyclobenzaprine, HYDROcodone-acetaminophen, ipratropium-albuterol, morphine injection, nitroGLYCERIN, ondansetron (ZOFRAN) IV, sodium chloride flush   Vital Signs    Vitals:   08/04/16 1500 08/04/16 1947 08/05/16 0500 08/05/16 0809  BP: (!) 169/97 117/66 128/64 129/77  Pulse: 98 (!) 117 (!) 101 83  Resp: Temp: 98.2 F (36.8 C) 98 F (36.7 C)  97.9 F (36.6 C)  TempSrc: Oral   Oral  SpO2: 95% 93% 95% 96%  Weight:      Height:        Intake/Output Summary (Last 24 hours) at 08/05/16 0933 Last data filed at 08/05/16 0836  Gross per 24 hour  Intake              240 ml  Output             1700 ml  Net            -1460 ml   Filed Weights   08/01/16 2056 08/04/16 0432  Weight: 190 lb (86.2 kg) 204 lb 12.8 oz (92.9 kg)    Physical Exam   GEN: Well nourished, well developed, in no acute  distress.  HEENT: Grossly normal.  Neck: Supple, no JVD, carotid bruits, or masses. Cardiac: RRR, no murmurs, rubs, or gallops. No clubbing, cyanosis, edema.  Radials/DP/PT 2+ and equal bilaterally.  r wrist cath site w/o bleeding/bruit/hematoma. Respiratory:  Respirations regular and unlabored, clear to auscultation bilaterally. GI: Soft, nontender, nondistended, BS + x 4. MS: no deformity or atrophy. Skin: warm and dry, no rash. Neuro:  Strength and sensation are intact. Psych: AAOx3.  Normal affect.  Labs    Chemistry Recent Labs Lab 08/01/16 2059 08/02/16 0532 08/02/16 0935 08/04/16 1330 08/05/16 0552  NA 136 136  --   --  134*  K 3.7 3.6  --   --  4.2  CL 106 106  --   --  99*  CO2 22 23  --   --  26  GLUCOSE 496* 271*  --   --  450*  BUN 18 15  --   --  16  CREATININE 0.68 0.31* 0.56 0.57 0.65  CALCIUM 9.6 8.5*  --   --  9.2  GFRNONAA >60 >60 >60 >60 >60  GFRAA >60 >60 >60 >60 >60  ANIONGAP 8 7  --   --  9     Hematology Recent Labs Lab 08/03/16 0158 08/04/16 0427 08/05/16 0552  WBC 9.9 10.6 11.4*  RBC 4.21 4.20 4.50  HGB 13.2 13.1 13.6  HCT 39.3 38.9 41.9  MCV 93.3 92.7 93.1  MCH 31.5 31.3 30.3  MCHC 33.7 33.7 32.5  RDW 14.0 14.0 13.9  PLT 228 246 241    Cardiac Enzymes Recent Labs Lab 08/02/16 0353 08/02/16 0935 08/02/16 1207 08/03/16 0739  TROPONINI 0.09* 0.10* 0.10* 0.12*     Radiology    No results found.  Telemetry    rsr - Personally Reviewed  ECG    rsr, 89, no acute st/t changes. - Personally Reviewed  Patient Profile   56 y.o. female with known coronary artery disease, previous stenting to the RCA, last ischemic evaluation in 2016, poor control diabetes with A1c of 13, continued smoking who presents with unstable angina, minimally elevated troponin, found to have ISR of RCA stent 4/16, now s/p PCI.  Assessment & Plan    1.  Unstable Angina/Demand Ischemia/CAD:  Pt presented with stuttering chest pain over the weekend.   Mild trop elevation to 0.12. Cath 4/16 revealed severe ISR  3.5 x 30 Resolute DES.  No significant c/p or dyspnea overnight.  Has ambulated some in room.  Cont asa, statin,  blocker, acei, plavix.  Will benefit from cardiac rehab.  Ok for d/c today from cardiac standpoint.  F/u in office in 7-14 days.  2.  Essential HTN:  bp up overnight but better this am. Cont  blocker, acei, and amlodipine.]  3.  HL:  LDL 71.  Push lipitor to 80.  4.  Tob Abuse:  Complete cessation advised.  5.  Poorly controlled DM II:  Glucose 450 this am.  Insulin mgmt per IM.  Will need close outpt f/u.  Signed, Nicolasa Ducking, NP  08/05/2016, 9:33 AM

## 2016-08-05 NOTE — Progress Notes (Signed)
Inpatient Diabetes Program Recommendations  AACE/ADA: New Consensus Statement on Inpatient Glycemic Control (2015)  Target Ranges:  Prepandial:   less than 140 mg/dL      Peak postprandial:   less than 180 mg/dL (1-2 hours)      Critically ill patients:  140 - 180 mg/dL   Lab Results  Component Value Date   GLUCAP 360 (H) 08/05/2016   HGBA1C 9.6 (H) 02/23/2015   A1C 12.2% on 05/23/16  Review of Glycemic Control  Results for EVON, DEJARNETT (MRN [id number]) as of 08/05/2016 09:29  Ref. Range 08/03/2016 16:41 08/03/2016 20:35 08/04/2016 16:37 08/04/2016 20:25 08/05/2016 07:30  Glucose-Capillary Latest Ref Range: 65 - 99 mg/dL 119 (H) 147 (H) 829 (H) 343 (H) 360 (H)     Diabetes history: Type 2 Outpatient Diabetes medications: Patient was instructed to increase Lantus from 42 to 50 units on 05/23/16 when she saw an endocrinologist.  Currently takes 52 units of Lantus qhs..  Has refused Metformin and "will consider" mealtime insulin (per MD note dated 05/23/16).   Current orders for Inpatient glycemic control: Lantus 20 units qday, Novolog 4 units tid, Novolog moderate correction scale (0-15 units) and Novolog 0-5 units qhs  Inpatient Diabetes Program Recommendations:  Consider giving an additional 10 units of Lantus now then increase tomorrow's am Lantus dose to 30 units qam.   Consider increasing mealtime insulin to Novolog 8 units tid with meals.   Dr. Allena Katz text paged @ 9:40am  Susette Racer, RN, [facility], [facility], CDE Diabetes Coordinator Inpatient Diabetes Program  [phone] (Team Pager) [phone] [facility] Office) 08/05/2016 9:36 AM

## 2016-08-05 NOTE — Care Management (Signed)
Spoke with patient regarding any discharge needs.  Declines need for any services

## 2016-08-05 NOTE — Plan of Care (Signed)
Problem: Health Behavior/Discharge Planning: Goal: Ability to manage health-related needs will improve Outcome: Adequate for Discharge Patient is non-compliant with DM treatment.  She talks about making changes.  Time will tell.

## 2016-08-05 NOTE — Progress Notes (Signed)
Inpatient Diabetes Program Recommendations  AACE/ADA: New Consensus Statement on Inpatient Glycemic Control (2015)  Target Ranges:  Prepandial:   less than 140 mg/dL      Peak postprandial:   less than 180 mg/dL (1-2 hours)      Critically ill patients:  140 - 180 mg/dL   Lab Results  Component Value Date   GLUCAP 360 (H) 08/05/2016   HGBA1C 9.6 (H) 02/23/2015    Spoke to patient regarding recent A1C.  She talked incessantly about pain, not getting medications, not making it to doctor's appointments, her husband that she hasn't seen in 15 years is living in her house, her blood sugar meter that broke, and her inability to check sugars. She does say that she is taking Lantus insulin 52 units qhs using an insulin pen.    Discussed the option to buy a [employer] meter and strips if her meter does not work in the future (she tells me that Medicare will only replace her meter once every 5 years).  She listened while I explained the use of meal time insulin- she was not opposed to it.  She tells me her MD did not start mealtime insulin because she had not brought a meter or blood sugars to the appointment.  She is opposed to taking Metformin because it caused her "to fall out".  If we order mealtime insulin at discharge, please place an order for insulin pens and the option to get Novolog , Humalog or Apidra whatever is covered by her insurance.   Susette Racer, RN, BA, MHA, CDE Diabetes Coordinator Inpatient Diabetes Program  [phone] (Team Pager) [phone] [facility] Office) 08/05/2016 11:54 AM

## 2016-08-05 NOTE — Care Management Important Message (Signed)
Important Message  Patient Details  Name: RYKA BEIGHLEY MRN: [id number] Date of Birth: 1960-06-22   Medicare Important Message Given:  Yes Signed IM notice given    Eber Hong, RN 08/05/2016, 11:07 AM

## 2016-08-05 NOTE — Discharge Summary (Addendum)
[place] [facility] [place] NAME: Jennifer [name]    MR#:  [id number]  DATE OF BIRTH:  Jul 14, 1960  DATE OF ADMISSION:  08/02/2016 ADMITTING PHYSICIAN: Jennifer Shelling, MD  DATE OF DISCHARGE: 08/05/2016  PRIMARY CARE PHYSICIAN: Jennifer Battiest, NP    ADMISSION DIAGNOSIS:  Unstable angina ([place]) [I20.0] Hyperglycemia [R73.9] Essential hypertension [I10] Chest pain [R07.9] Chest pain, unspecified type [R07.9]  DISCHARGE DIAGNOSIS:  Acute Coronary Syndrome s/p PCI and DES to proximal RCA Chronic pain syndrome DM-II Chronic Smoking SECONDARY DIAGNOSIS:   Past Medical History:  Diagnosis Date  . Anxiety   . Arthritis    "qwhere" (06/14/2014)  . Bipolar disorder ([place])   . CAD (coronary artery disease)    a. cardiac cath: 2010: 95% mRCA s/p PCI/DES, 60% RPDA; b. cath 05/2014, widely patent LM, mild to mod dz in LAD and LCx, 90% dRCA s/p PCI/DES; c. cath 02/2015: severe ISR proximal segement of distal RCA stent s/p PCI/DES, nl EF by nuc 11/16, mildly elevated LVEDP   . Chronic bronchitis ([place])    "get it q yr"  . Daily headache    "when my blood pressure is up" (06/14/2014)  . Depression   . GERD (gastroesophageal reflux disease)   . Heart murmur   . High cholesterol   . History of stomach ulcers   . Hypertension   . Insomnia   . Kidney stones   . Kidney stones   . Migraine    "haven't had them in a good while; did get them 1-2 times/yr" (06/14/2014)  . Myocardial infarction ([place]) 2008; ~ 2012  . Pneumonia    "get it 1-2 times/year" (06/14/2014)  . RLS (restless legs syndrome)   . Seizures ([place])   . Sleep apnea    "they want me to do the lab but I haven't" (06/14/2014)  . Stroke [facility])    "they say I've had several" (06/14/2014)  . Type II diabetes mellitus ([place])   . Urinary, incontinence, stress female     HOSPITAL COURSE:   56 year old woman with known coronary artery disease, previous stenting to the RCA, last ischemic evaluation  in 2016, poor control diabetes with A1c of 13, continued smoking who presents with unstable angina, minimally elevated troponin.  1. Unstable angina/Acute Coronary Syndrome Minimal elevation of her troponin in the setting of severe symptoms concerning for ischemia, known disease, multiple risk factors are reported controlled including diabetes and continued smoking. Echocardiogram with no significant wall motion abnormalities, ejection fraction greater than 55% - aspirin, Plavix, statin, beta blocker -Troponin 0.03--- 0.09--0.1--- 0.1 -Repeat EKG last nite showed flipped t waves in [place] -Cardiac consultation with Dr. Rockey [name] appreciated.  -s/p Cath today  Showed Severe one-vessel coronary artery disease affecting the right coronary artery with multiple previous stenting. There seems to be a plaque rupture within the proximal to mid stent which is the culprit for unstable angina. Stable disease in the left circumflex.. Normal LV systolic function and mildly elevated left ventricular end-diastolic pressure. Successful angioplasty and drug-eluting stent placement to the proximal/mid right coronary artery within the previously placed stent. There was 60% disease in the mid to distal segment which was left to be treated medically. -weaned off nitro gtt and heparin gtt -cont asa,plavix  2) poorly controlled diabetes with circulatory complication Dietary and medication noncompliance Cont current meds including Lantus  3) smoker Continues to smoke one pack per day Previously tried Chantix, did not remember if she had side effects  Patient advised smoking cessation  4) hypertension Continue current medications for now  5) chronic pain syndrome Pt follows Jennifer [name] at [facility] in [place]. Spoke with her and she mentioned to me not to give any oral narcotics since pt tends to use more than she is suppose to D/w pt and asked to follow Jennifer [name] this week. Pt agreeable  D/c home from  cardiology standpoint  CONSULTS OBTAINED:  Treatment Team:  Jennifer Merritts, MD  DRUG ALLERGIES:   Allergies  Allergen Reactions  . Lithium Other (See Comments)    Blood dyscrasia  . Tegretol [Carbamazepine] Other (See Comments)    Blood dyscrasia when on with Lithium  . Levaquin [Levofloxacin In D5w] Other (See Comments)    "Salty" sensation in mouth. "Hard time to explain it"  . Lodine [Etodolac] Other (See Comments)    Rash, N/V    DISCHARGE MEDICATIONS:   Current Discharge Medication List    START taking these medications   Details  nitroGLYCERIN (NITROSTAT) 0.4 MG SL tablet Place 1 tablet (0.4 mg total) under the tongue every 5 (five) minutes as needed for chest pain (Do not give more than 3 SL tablets in 15 minutes.). Qty: 20 tablet, Refills: 2      CONTINUE these medications which have NOT CHANGED   Details  amLODipine (NORVASC) 2.5 MG tablet Take 1 tablet (2.5 mg total) by mouth daily. Qty: 90 tablet, Refills: 3    aspirin EC 81 MG EC tablet Take 1 tablet (81 mg total) by mouth daily. Qty: 30 tablet, Refills: 2    atorvastatin (LIPITOR) 40 MG tablet take 1 tablet by mouth once daily AT 6PM AS DIRECTED Qty: 90 tablet, Refills: 0    Blood Glucose Monitoring Suppl W/DEVICE KIT Test 1-2 times daily; E11.65 diagnosis Qty: 1 each, Refills: 0    BUTRANS 10 MCG/HR PTWK patch Place 1 patch onto the skin once a week.    clopidogrel (PLAVIX) 75 MG tablet Take 1 tablet (75 mg total) by mouth daily with breakfast. Qty: 90 tablet, Refills: 3    cyclobenzaprine (FLEXERIL) 10 MG tablet Take 1 tablet by mouth 3 (three) times daily.    furosemide (LASIX) 20 MG tablet take 1 tablet by mouth once daily Qty: 30 tablet, Refills: 3    gabapentin (NEURONTIN) 300 MG capsule Take 3 capsules (900 mg total) by mouth 3 (three) times daily. Qty: 30 capsule, Refills: 0    insulin glargine (LANTUS) 100 UNIT/ML injection Inject 52 Units into the skin at bedtime.    lisinopril  (PRINIVIL,ZESTRIL) 40 MG tablet take 1 tablet by mouth once daily Qty: 90 tablet, Refills: 3    metoprolol (TOPROL-XL) 200 MG 24 hr tablet take 1 tablet by mouth once daily WITH OR IMMEDIATELY FOLLOWING A MEAL Qty: 90 tablet, Refills: 0    oxyCODONE (OXY IR/ROXICODONE) 5 MG immediate release tablet Take 1 tablet (5 mg total) by mouth every 6 (six) hours as needed for moderate pain. Qty: 15 tablet, Refills: 0    COMBIVENT RESPIMAT 20-100 MCG/ACT AERS respimat Inhale 1 puff into the lungs every 6 (six) hours as needed for wheezing or shortness of breath. Qty: 1 Inhaler, Refills: 2    Multiple Vitamins-Minerals (MULTIVITAMIN WITH MINERALS) tablet Take 1 tablet by mouth daily.    nicotine (NICODERM CQ - DOSED IN MG/24 HOURS) 21 mg/24hr patch Place 1 patch (21 mg total) onto the skin daily. Qty: 28 patch, Refills: 11      STOP taking these medications  ibuprofen (ADVIL,MOTRIN) 800 MG tablet         If you experience worsening of your admission symptoms, develop shortness of breath, life threatening emergency, suicidal or homicidal thoughts you must seek medical attention immediately by calling 911 or calling your MD immediately  if symptoms less severe.  You Must read complete instructions/literature along with all the possible adverse reactions/side effects for all the Medicines you take and that have been prescribed to you. Take any new Medicines after you have completely understood and accept all the possible adverse reactions/side effects.   Please note  You were cared for by a hospitalist during your hospital stay. If you have any questions about your discharge medications or the care you received while you were in the hospital after you are discharged, you can call the unit and asked to speak with the hospitalist on call if the hospitalist that took care of you is not available. Once you are discharged, your primary care physician will handle any further medical issues. Please note  that NO REFILLS for any discharge medications will be authorized once you are discharged, as it is imperative that you return to your primary care physician (or establish a relationship with a primary care physician if you do not have one) for your aftercare needs so that they can reassess your need for medications and monitor your lab values. Today   SUBJECTIVE   No new complaints  VITAL SIGNS:  Blood pressure 129/77, pulse 83, temperature 97.9 F (36.6 C), temperature source Oral, resp. rate 14, height _0  (1.575 m), weight 92.9 kg (204 lb 12.8 oz), SpO2 96 %.  I/O:    Intake/Output Summary (Last 24 hours) at 08/05/16 1046 Last data filed at 08/05/16 0937  Gross per 24 hour  Intake              240 ml  Output             2400 ml  Net            -2160 ml    PHYSICAL EXAMINATION:  GENERAL:  56 y.o.-year-old patient lying in the bed with no acute distress. obese EYES: Pupils equal, round, reactive to light and accommodation. No scleral icterus. Extraocular muscles intact.  HEENT: Head atraumatic, normocephalic. Oropharynx and nasopharynx clear.  NECK:  Supple, no jugular venous distention. No thyroid enlargement, no tenderness.  LUNGS: Normal breath sounds bilaterally, no wheezing, rales,rhonchi or crepitation. No use of accessory muscles of respiration.  CARDIOVASCULAR: S1, S2 normal. No murmurs, rubs, or gallops.  ABDOMEN: Soft, non-tender, non-distended. Bowel sounds present. No organomegaly or mass.  EXTREMITIES: No pedal edema, cyanosis, or clubbing.  NEUROLOGIC: Cranial nerves II through XII are intact. Muscle strength 5/5 in all extremities. Sensation intact. Gait not checked.  PSYCHIATRIC: The patient is alert and oriented x 3.  SKIN: No obvious rash, lesion, or ulcer.   DATA REVIEW:   CBC   Recent Labs Lab 08/05/16 0552  WBC 11.4*  HGB 13.6  HCT 41.9  PLT 241    Chemistries   Recent Labs Lab 08/05/16 0552  NA 134*  K 4.2  CL 99*  CO2 26  GLUCOSE 450*   BUN 16  CREATININE 0.65  CALCIUM 9.2    Microbiology Results   No results found for this or any previous visit (from the past 240 hour(s)).  RADIOLOGY:  No results found.   Management plans discussed with the patient, family and they are in agreement.  CODE  STATUS:     Code Status Orders        Start     Ordered   08/02/16 0754  Full code  Continuous     08/02/16 0753    Code Status History    Date Active Date Inactive Code Status Order ID Comments User Context   02/26/2015  1:50 PM 02/27/2015  4:51 PM Full Code [id number]  Wellington Hampshire, MD Inpatient   02/23/2015  8:19 AM 02/26/2015  1:50 PM Full Code [id number]  Harrie Foreman, MD Inpatient   06/15/2014 12:29 PM 06/16/2014  5:54 PM Full Code [id number]  Jettie Booze, MD Inpatient   06/14/2014  8:47 AM 06/15/2014 12:29 PM Full Code [id number]  Melton Alar, PA-C Inpatient   06/14/2014  7:25 AM 06/14/2014  8:47 AM Full Code [id number]  Louellen Molder, MD Inpatient      TOTAL TIME TAKING CARE OF THIS PATIENT: 40 minutes.    Victor Langenbach M.D on 08/05/2016 at 10:46 AM  Between 7am to 6pm - Pager - [phone] After 6pm go to [email or web address] - password [email or web address] [facility]  Office  [phone]  CC: Primary care physician; Jennifer Battiest, NP

## 2016-08-10 ENCOUNTER — Emergency Department
Admission: EM | Admit: 2016-08-10 | Discharge: 2016-08-10 | Disposition: A | Discharge status: Home / Self Care | Payer: Medicare Other | Attending: Emergency Medicine | Admitting: Emergency Medicine

## 2016-08-10 DIAGNOSIS — L0211 Cutaneous abscess of neck: Secondary | ICD-10-CM | POA: Insufficient documentation

## 2016-08-10 DIAGNOSIS — Z794 Long term (current) use of insulin: Secondary | ICD-10-CM | POA: Diagnosis not present

## 2016-08-10 DIAGNOSIS — Z7982 Long term (current) use of aspirin: Secondary | ICD-10-CM | POA: Insufficient documentation

## 2016-08-10 DIAGNOSIS — Z79899 Other long term (current) drug therapy: Secondary | ICD-10-CM | POA: Insufficient documentation

## 2016-08-10 DIAGNOSIS — E119 Type 2 diabetes mellitus without complications: Secondary | ICD-10-CM | POA: Diagnosis not present

## 2016-08-10 DIAGNOSIS — F17210 Nicotine dependence, cigarettes, uncomplicated: Secondary | ICD-10-CM | POA: Diagnosis not present

## 2016-08-10 DIAGNOSIS — I2510 Atherosclerotic heart disease of native coronary artery without angina pectoris: Secondary | ICD-10-CM | POA: Diagnosis not present

## 2016-08-10 DIAGNOSIS — I10 Essential (primary) hypertension: Secondary | ICD-10-CM | POA: Diagnosis not present

## 2016-08-10 DIAGNOSIS — I252 Old myocardial infarction: Secondary | ICD-10-CM | POA: Insufficient documentation

## 2016-08-10 DIAGNOSIS — M542 Cervicalgia: Secondary | ICD-10-CM | POA: Diagnosis present

## 2016-08-10 DIAGNOSIS — L0291 Cutaneous abscess, unspecified: Secondary | ICD-10-CM

## 2016-08-10 MED ORDER — CLINDAMYCIN HCL 300 MG PO CAPS: 300.0000 mg | ORAL_CAPSULE | Freq: Four times a day (QID) | ORAL | 0 refills | Status: AC

## 2016-08-10 MED ORDER — CLINDAMYCIN PHOSPHATE 300 MG/2ML IJ SOLN
INTRAMUSCULAR | Status: AC
  Filled 2016-08-10: qty 2

## 2016-08-10 MED ORDER — CLINDAMYCIN PHOSPHATE 900 MG/6ML IJ SOLN
600.0000 mg | Freq: Once | INTRAMUSCULAR | Status: AC
  Administered 2016-08-10: 600 mg via INTRAMUSCULAR

## 2016-08-10 MED ORDER — LIDOCAINE HCL (PF) 1 % IJ SOLN
INTRAMUSCULAR | Status: AC
Start: 2016-08-10 — End: 2016-08-10
  Administered 2016-08-10: 5 mL
  Filled 2016-08-10: qty 5

## 2016-08-10 MED ORDER — CLINDAMYCIN HCL 300 MG PO CAPS: 300.0000 mg | ORAL_CAPSULE | Freq: Three times a day (TID) | ORAL | 0 refills | Status: DC

## 2016-08-10 NOTE — ED Provider Notes (Signed)
[facility] Emergency Department Provider Note  ____________________________________________  Time seen: Approximately 7:37 AM  I have reviewed the triage vital signs and the nursing notes.   HISTORY  Chief Complaint Abscess and Headache    HPI Jennifer [name] is a 56 y.o. female that presents to the emergency department with 1 painful area and 2 non painful areas on the back of her neck. Patient states that this started after she was in the hospital a week ago. They began as a tiny bumps and in the last 2 days one has gotten significantly larger and more painful. She has been trying to pinch it but no drainage has occurred. She's had an abscess once before 10 years ago. She used to take Percocet for chronic pain. Tylenol does not help. She takes 800 mg ibuprofen 3 times a day. She took the ibuprofen 1 hour ago. l She denies fever, shortness of breath, chest pain, nausea, vomiting, abdominal pain.   Past Medical History:  Diagnosis Date  . Anxiety   . Arthritis    "qwhere" (06/14/2014)  . Bipolar disorder ([place])   . CAD (coronary artery disease)    a. cardiac cath: 2010: 95% mRCA s/p PCI/DES, 60% RPDA; b. cath 05/2014, widely patent LM, mild to mod dz in LAD and LCx, 90% dRCA s/p PCI/DES; c. cath 02/2015: severe ISR proximal segement of distal RCA stent s/p PCI/DES, nl EF by nuc 11/16, mildly elevated LVEDP   . Chronic bronchitis ([place])    "get it q yr"  . Daily headache    "when my blood pressure is up" (06/14/2014)  . Depression   . GERD (gastroesophageal reflux disease)   . Heart murmur   . High cholesterol   . History of stomach ulcers   . Hypertension   . Insomnia   . Kidney stones   . Kidney stones   . Migraine    "haven't had them in a good while; did get them 1-2 times/yr" (06/14/2014)  . Myocardial infarction ([place]) 2008; ~ 2012  . Pneumonia    "get it 1-2 times/year" (06/14/2014)  . RLS (restless legs syndrome)   . Seizures ([place])   . Sleep apnea     "they want me to do the lab but I haven't" (06/14/2014)  . Stroke [facility])    "they say I've had several" (06/14/2014)  . Type II diabetes mellitus ([place])   . Urinary, incontinence, stress female     Patient Active Problem List   Diagnosis Date Noted  . Unstable angina ([place]) 08/03/2016  . Morbid obesity ([place]) 05/02/2016  . Chest pain with moderate risk for cardiac etiology 03/14/2015  . CAD (coronary artery disease)   . Hospital discharge follow-up 03/07/2015  . Poorly controlled type 2 diabetes mellitus with complication ([place]) 74/[id number]/9563  . Hyperlipidemia 06/22/2014  . Stented coronary artery   . Chronic pain syndrome   . Chest pain at rest 06/14/2014  . Acute bronchitis 06/14/2014  . Tobacco abuse 06/14/2014  . Essential hypertension 06/14/2014  . GERD (gastroesophageal reflux disease) 06/14/2014  . Bipolar disorder ([place]) 06/14/2014  . Chest pain 06/14/2014    Past Surgical History:  Procedure Laterality Date  . ABDOMINAL HYSTERECTOMY  1984   "I've got 1 ovary left"  . CARDIAC CATHETERIZATION N/A 02/26/2015   Procedure: Left Heart Cath and Coronary Angiography;  Surgeon: Wellington Hampshire, MD;  Location: [place] CV LAB;  Service: Cardiovascular;  Laterality: N/A;  . CARDIAC CATHETERIZATION N/A 02/26/2015  Procedure: Coronary Stent Intervention;  Surgeon: Wellington Hampshire, MD;  Location: [place] CV LAB;  Service: Cardiovascular;  Laterality: N/A;  . CESAREAN SECTION  1984  . CORONARY ANGIOPLASTY WITH STENT PLACEMENT  2008; ~ 2012   "1; 1"  . CORONARY STENT INTERVENTION N/A 08/04/2016   Procedure: Coronary Stent Intervention;  Surgeon: Wellington Hampshire, MD;  Location: [place] CV LAB;  Service: Cardiovascular;  Laterality: N/A;  . DILATION AND CURETTAGE OF UTERUS    . EXTRACORPOREAL SHOCK WAVE LITHOTRIPSY  X 2  . KNEE ARTHROSCOPY Left   . LEFT HEART CATH Bilateral 08/04/2016   Procedure: Left Heart Cath poss PCI;  Surgeon: Wellington Hampshire, MD;  Location: [place] CV LAB;  Service: Cardiovascular;  Laterality: Bilateral;  . LEFT HEART CATHETERIZATION WITH CORONARY ANGIOGRAM N/A 06/15/2014   Procedure: LEFT HEART CATHETERIZATION WITH CORONARY ANGIOGRAM;  Surgeon: Jettie Booze, MD;  Location: [facility] CATH LAB;  Service: Cardiovascular;  Laterality: N/A;  . PERCUTANEOUS CORONARY STENT INTERVENTION (PCI-S)  06/15/2014   Procedure: PERCUTANEOUS CORONARY STENT INTERVENTION (PCI-S);  Surgeon: Jettie Booze, MD;  Location: [facility] CATH LAB;  Service: Cardiovascular;;  . TONSILLECTOMY    . TUBAL LIGATION      Prior to Admission medications   Medication Sig Start Date End Date Taking? Authorizing Provider  amLODipine (NORVASC) 2.5 MG tablet Take 1 tablet (2.5 mg total) by mouth daily. 07/12/15   Minna Merritts, MD  aspirin EC 81 MG EC tablet Take 1 tablet (81 mg total) by mouth daily. 02/27/15   Gladstone Lighter, MD  atorvastatin (LIPITOR) 40 MG tablet take 1 tablet by mouth once daily AT 6PM AS DIRECTED 04/28/16   Minna Merritts, MD  Blood Glucose Monitoring Suppl W/DEVICE KIT Test 1-2 times daily; E11.65 diagnosis 03/02/15   Rubbie Battiest, NP  BUTRANS 10 MCG/HR PTWK patch Place 1 patch onto the skin once a week. 07/03/16   Historical Provider, MD  clindamycin (CLEOCIN) 300 MG capsule Take 1 capsule (300 mg total) by mouth 4 (four) times daily. 08/10/16 08/20/16  Laban Emperor, PA-C  clopidogrel (PLAVIX) 75 MG tablet Take 1 tablet (75 mg total) by mouth daily with breakfast. Patient taking differently: Take 150 mg by mouth daily.  07/12/15   Minna Merritts, MD  COMBIVENT RESPIMAT 20-100 MCG/ACT AERS respimat Inhale 1 puff into the lungs every 6 (six) hours as needed for wheezing or shortness of breath. 02/27/15   Gladstone Lighter, MD  cyclobenzaprine (FLEXERIL) 10 MG tablet Take 1 tablet by mouth 3 (three) times daily. 07/17/16   Historical Provider, MD  furosemide (LASIX) 20 MG tablet take 1 tablet by mouth once daily 04/23/16   Areta Haber Dunn, PA-C   gabapentin (NEURONTIN) 300 MG capsule Take 3 capsules (900 mg total) by mouth 3 (three) times daily. Patient taking differently: Take 900 mg by mouth 4 (four) times daily.  02/27/15   Gladstone Lighter, MD  insulin glargine (LANTUS) 100 UNIT/ML injection Inject 52 Units into the skin at bedtime.    Historical Provider, MD  lisinopril (PRINIVIL,ZESTRIL) 40 MG tablet take 1 tablet by mouth once daily 07/18/16   Minna Merritts, MD  metoprolol (TOPROL-XL) 200 MG 24 hr tablet take 1 tablet by mouth once daily WITH OR IMMEDIATELY FOLLOWING A MEAL 04/28/16   Minna Merritts, MD  Multiple Vitamins-Minerals (MULTIVITAMIN WITH MINERALS) tablet Take 1 tablet by mouth daily.    Historical Provider, MD  nicotine (NICODERM CQ - DOSED IN  MG/24 HOURS) 21 mg/24hr patch Place 1 patch (21 mg total) onto the skin daily. Patient not taking: Reported on 08/02/2016 11/28/15   Areta Haber Dunn, PA-C  nitroGLYCERIN (NITROSTAT) 0.4 MG SL tablet Place 1 tablet (0.4 mg total) under the tongue every 5 (five) minutes as needed for chest pain (Do not give more than 3 SL tablets in 15 minutes.). 08/05/16   Fritzi Mandes, MD  oxyCODONE (OXY IR/ROXICODONE) 5 MG immediate release tablet Take 1 tablet (5 mg total) by mouth every 6 (six) hours as needed for moderate pain. Patient taking differently: Take 10 mg by mouth every 4 (four) hours as needed for moderate pain.  02/27/15   Gladstone Lighter, MD    Allergies Lithium; Tegretol [carbamazepine]; Levaquin [levofloxacin in d5w]; and Lodine [etodolac]  Family History  Problem Relation Age of Onset  . Family history unknown: Yes    Social History Social History  Substance Use Topics  . Smoking status: Current Every Day Smoker    Packs/day: 1.00    Years: 35.00    Types: Cigarettes  . Smokeless tobacco: Never Used     Comment: Patches  . Alcohol use 0.0 oz/week     Comment: 06/14/2014 "might drink twice/yr"     Review of Systems  Constitutional: No fever/chills Cardiovascular: No  chest pain. Respiratory: No cough. No SOB. Gastrointestinal: No abdominal pain.  No nausea, no vomiting.  Musculoskeletal: Negative for musculoskeletal pain. Skin: Negative for rash, abrasions, lacerations, ecchymosis. Neurological: Negative for numbness or tingling. Positive for headache.   ____________________________________________   PHYSICAL EXAM:  VITAL SIGNS: ED Triage Vitals  Enc Vitals Group     BP 08/10/16 0719 (!) 109/43     Pulse Rate 08/10/16 0719 86     Resp 08/10/16 0719 14     Temp 08/10/16 0719 98.3 F (36.8 C)     Temp Source 08/10/16 0719 Oral     SpO2 08/10/16 0719 97 %     Weight 08/10/16 0721 183 lb (83 kg)     Height 08/10/16 0721 5' 2" (1.575 m)     Head Circumference --      Peak Flow --      Pain Score 08/10/16 0719 7     Pain Loc --      Pain Edu? --      Excl. in [place]? --      Constitutional: Alert and oriented. Well appearing and in no acute distress. Eyes: Conjunctivae are normal. PERRL. EOMI. Head: Atraumatic. ENT:      Ears:      Nose: No congestion/rhinnorhea.      Mouth/Throat: Mucous membranes are moist.  Neck: No stridor.   Cardiovascular: Normal rate, regular rhythm.  Good peripheral circulation. Respiratory: Normal respiratory effort without tachypnea or retractions. Lungs CTAB. Good air entry to the bases with no decreased or absent breath sounds. Musculoskeletal: Full range of motion to all extremities. No gross deformities appreciated. Neurologic:  Normal speech and language. No gross focal neurologic deficits are appreciated.  Skin:  Skin is warm, dry and intact. 2 cm x 2 cm area of fluctuance in the middle and induration on the perifery on back of neck on right side. 2 mm millimeter scab in the center. Extremely tender to palpation. 2, 1 cm x 1 cm areas of induration in the middle of neck. Nontender to palpation.  ____________________________________________   LABS (all labs ordered are listed, but only abnormal results are  displayed)  Labs Reviewed - No data  to display ____________________________________________  EKG   ____________________________________________  RADIOLOGY   No results found.  ____________________________________________    PROCEDURES  Procedure(s) performed:    Procedures  INCISION AND DRAINAGE Performed by: Laban Emperor Consent: Verbal consent obtained. Risks and benefits: risks, benefits and alternatives were discussed Type: abscess  Body area: neck  Anesthesia: local infiltration  Incision was made with a scalpel.  Local anesthetic: lidocaine 1% without epinephrine  Anesthetic total: 2 ml  Complexity: complex Blunt dissection to break up loculations  Drainage: purulent  Drainage amount: 2 ccs  Packing material: 1/4 in iodoform gauze  Patient tolerance: Patient tolerated the procedure well with no immediate complications.    Medications  lidocaine (PF) (XYLOCAINE) 1 % injection (5 mLs  Given 08/10/16 0821)  clindamycin (CLEOCIN) injection 600 mg (600 mg Intramuscular Given 08/10/16 0820)     ____________________________________________   INITIAL IMPRESSION / ASSESSMENT AND PLAN / ED COURSE  Pertinent labs & imaging results that were available during my care of the patient were reviewed by me and considered in my medical decision making (see chart for details).  Review of the [place] CSRS was performed in accordance of the [place] prior to dispensing any controlled drugs.   Patient's diagnosis is consistent with abscess. Vital signs and exam are reassuring. Abscess was drained and packed. Patient has 2 small areas that are indurated that I do not think drainage is indicated. IM clindamycin given ED. Patient will be discharged home with prescriptions for clindamycin. Patient is to follow up with PCP as directed. Patient is given ED precautions to return to the ED for any worsening or new  symptoms.     ____________________________________________  FINAL CLINICAL IMPRESSION(S) / ED DIAGNOSES  Final diagnoses:  Abscess      NEW MEDICATIONS STARTED DURING THIS VISIT:  Current Discharge Medication List    START taking these medications   Details  clindamycin (CLEOCIN) 300 MG capsule Take 1 capsule (300 mg total) by mouth 4 (four) times daily. Qty: 40 capsule, Refills: 0            This chart was dictated using voice recognition software/Dragon. Despite best efforts to proofread, errors can occur which can change the meaning. Any change was purely unintentional.    Laban Emperor, PA-C 08/10/16 [place], MD 08/10/16 [phone]

## 2016-08-10 NOTE — Progress Notes (Signed)
Cardiology Office Note  Date:  08/12/2016   ID:  Jennifer [name], DOB [birth year], MRN [id number]  PCP:  Rexene Alberts HIE, MD   Chief Complaint  Patient presents with  . other    Follow up post heart cath. Patient c/o Chest pain and SOB. Meds reviewed verbally with patient.     HPI:  Jennifer [name] is a 56 year old woman with long history of  smoking who continues to smoke,  coronary artery disease, stent to the mid RCA 2008, repeat stenting to the mid RCA 2012 ,   catheterization February 2016 showing severe distal RCA disease estimated at 90%, patent mid RCA stents, also with 70% mid to distal circumflex disease of a small vessel,  presenting for routine follow-up of her coronary artery disease  f/u after admission for unstable angina, minimal troponin elevation,  Cardiac cath with ISR of RCA stent proximal to midVessel PCI with resolute onyx 3.5 x 30 mm  Clinic Room smells like cigarette smoke today Having occasional chest pain Has not been taking nitroglycerin Nonexertional Shows normal sinus rhythm with rate 83 bpm no significant ST or T-wave changes  Working on her cigarettes, down to 5 a day Has not seen primary care in follow-up for her diabetes  EKG personally reviewed by myself on todays visit   Other past medical history reviewed lost her 78-monthold grandson, died in the crib She attempted rescue breaths  diabetes,  hemoglobin A1c greater than 13 started on insulin,  struggles to keep her sugars down  Despite her best efforts with diet, did not notice much improvement   Thyroid nodule, [facility]  Last stress test 03/19/2015  She reports having significant leg pain, unable to walk very far secondary to discomfort.   reports having chronic pain issues, sees the pain clinic    Cardiac catheterization February 2016   The left main coronary artery is a small vessel but widely patent. The left anterior descending artery,  In the  ostium of the lower pole, there is a moderate stenosis. The left circumflex artery AV groove circumflex has a focal 70% stenosis. This vessel is quite small, likely less than 2 mm in diameter. The right coronary artery stents in the mid right coronary artery appear widely patent. There is mild disease proximally. In the distal RCA, moderate disease followed by a focal severe, 90% stenosis.  A 2.5 x 27 drug-eluting stent was deployed. postdilated with a 3.0 x 15 balloon.    PMH:   has a past medical history of Anxiety; Arthritis; Bipolar disorder ([place]; CAD (coronary artery disease); Chronic bronchitis ([place]; Daily headache; Depression; GERD (gastroesophageal reflux disease); Heart murmur; High cholesterol; History of stomach ulcers; Hypertension; Insomnia; Kidney stones; Kidney stones; Migraine; Myocardial infarction ([place] (2008; ~ 2012); Pneumonia; RLS (restless legs syndrome); Seizures ([place]; Sleep apnea; Stroke ([facility]; Type II diabetes mellitus ([place]; and Urinary, incontinence, stress female.  PSH:    Past Surgical History:  Procedure Laterality Date  . ABDOMINAL HYSTERECTOMY  1984   "I've got 1 ovary left"  . CARDIAC CATHETERIZATION N/A 02/26/2015   Procedure: Left Heart Cath and Coronary Angiography;  Surgeon: MWellington Hampshire MD;  [place] [place];  Service: Cardiovascular;  Laterality: N/A;  . CARDIAC CATHETERIZATION N/A 02/26/2015   Procedure: Coronary Stent Intervention;  Surgeon: MWellington Hampshire MD;  [place] [facility];  Service: Cardiovascular;  Laterality: N/A;  . CESAREAN SECTION  1984  . CORONARY ANGIOPLASTY WITH STENT  PLACEMENT  2008; ~ 2012   "1; 1"  . CORONARY STENT INTERVENTION N/A 08/04/2016   Procedure: Coronary Stent Intervention;  Surgeon: Wellington Hampshire, MD;  Location: [place] CV LAB;  Service: Cardiovascular;  Laterality: N/A;  . DILATION AND CURETTAGE OF UTERUS    . EXTRACORPOREAL SHOCK WAVE LITHOTRIPSY  X 2  . KNEE ARTHROSCOPY Left    . LEFT HEART CATH Bilateral 08/04/2016   Procedure: Left Heart Cath poss PCI;  Surgeon: Wellington Hampshire, MD;  Location: [place] [facility] LAB;  Service: Cardiovascular;  Laterality: Bilateral;  . LEFT HEART CATHETERIZATION WITH CORONARY ANGIOGRAM N/A 06/15/2014   Procedure: LEFT HEART CATHETERIZATION WITH CORONARY ANGIOGRAM;  Surgeon: Jettie Booze, MD;  [facility] C[facility] LAB;  Service: Cardiovascular;  Laterality: N/A;  . PERCUTANEOUS CORONARY STENT INTERVENTION (PCI-S)  06/15/2014   Procedure: PERCUTANEOUS CORONARY STENT INTERVENTION (PCI-S);  Surgeon: Jettie Booze, MD;  [facility] CATH LAB;  Service: Cardiovascular;;  . TONSILLECTOMY    . TUBAL LIGATION      Current Outpatient Prescriptions  Medication Sig Dispense Refill  . amLODipine (NORVASC) 2.5 MG tablet Take 1 tablet (2.5 mg total) by mouth daily. 90 tablet 3  . aspirin EC 81 MG EC tablet Take 1 tablet (81 mg total) by mouth daily. 30 tablet 2  . atorvastatin (LIPITOR) 40 MG tablet take 1 tablet by mouth once daily AT 6PM AS DIRECTED 90 tablet 0  . Blood Glucose Monitoring Suppl W/DEVICE KIT Test 1-2 times daily; E11.65 diagnosis 1 each 0  . BUTRANS 10 MCG/HR PTWK patch Place 1 patch onto the skin once a week.    . clindamycin (CLEOCIN) 300 MG capsule Take 1 capsule (300 mg total) by mouth 4 (four) times daily. 40 capsule 0  . clopidogrel (PLAVIX) 75 MG tablet Take 1 tablet (75 mg total) by mouth daily with breakfast. (Patient taking differently: Take 150 mg by mouth daily. ) 90 tablet 3  . COMBIVENT RESPIMAT 20-100 MCG/ACT AERS respimat Inhale 1 puff into the lungs every 6 (six) hours as needed for wheezing or shortness of breath. 1 Inhaler 2  . cyclobenzaprine (FLEXERIL) 10 MG tablet Take 1 tablet by mouth 3 (three) times daily.    . furosemide (LASIX) 20 MG tablet take 1 tablet by mouth once daily 30 tablet 3  . gabapentin (NEURONTIN) 300 MG capsule Take 3 capsules (900 mg total) by mouth 3 (three) times daily.  (Patient taking differently: Take 900 mg by mouth 4 (four) times daily. ) 30 capsule 0  . insulin glargine (LANTUS) 100 UNIT/ML injection Inject 52 Units into the skin at bedtime.    [name] lisinopril (PRINIVIL,ZESTRIL) 40 MG tablet take 1 tablet by mouth once daily 90 tablet 3  . metoprolol (TOPROL-XL) 200 MG 24 hr tablet take 1 tablet by mouth once daily with OR IMMEDIETLY FOLLOWING A MEAL 90 tablet 3  . Multiple Vitamins-Minerals (MULTIVITAMIN WITH MINERALS) tablet Take 1 tablet by mouth daily.    . nicotine (NICODERM CQ - DOSED IN MG/24 HOURS) 21 mg/24hr patch Place 1 patch (21 mg total) onto the skin daily. 28 patch 11  . nitroGLYCERIN (NITROSTAT) 0.4 MG SL tablet Place 1 tablet (0.4 mg total) under the tongue every 5 (five) minutes as needed for chest pain (Do not give more than 3 SL tablets in 15 minutes.). 20 tablet 2  . oxyCODONE (OXY IR/ROXICODONE) 5 MG immediate release tablet Take 1 tablet (5 mg total) by mouth every 6 (six) hours as  needed for moderate pain. (Patient taking differently: Take 10 mg by mouth every 4 (four) hours as needed for moderate pain. ) 15 tablet 0   No current facility-administered medications for this visit.      Allergies:   Lithium; Tegretol [carbamazepine]; Levaquin [levofloxacin in d5w]; and Lodine [etodolac]   Social History:  The patient  reports that she has been smoking Cigarettes.  She has a 35.00 pack-year smoking history. She has never used smokeless tobacco. She reports that she drinks alcohol. She reports that she uses drugs, including Marijuana.   Family History:   Family history is unknown by patient.    Review of Systems: Review of Systems  Constitutional: Negative.   Respiratory: Negative.   Cardiovascular: Positive for chest pain.  Gastrointestinal: Negative.   Musculoskeletal:       Bilateral leg pain with walking  Neurological: Negative.   Psychiatric/Behavioral: Positive for depression. The patient is nervous/anxious.   All other  systems reviewed and are negative.    PHYSICAL EXAM: VS:  BP 116/68 (BP Location: Left Arm, Patient Position: Sitting, Cuff Size: Normal)   Pulse 83   Ht _0  (1.575 m)   Wt 206 lb (93.4 kg)   BMI 37.68 kg/m  , BMI Body mass index is 37.68 kg/m. GEN: Well nourished, well developed, in no acute distress , Obese  HEENT: normal  Neck: no JVD, carotid bruits, or masses Cardiac: RRR; no murmurs, rubs, or gallops,no edema  Respiratory:  clear to auscultation bilaterally, normal work of breathing GI: soft, nontender, nondistended, + BS MS: no deformity or atrophy  Skin: warm and dry, no rash Neuro:  Strength and sensation are intact Psych: euthymic mood, full affect    Recent Labs: 05/20/2016: ALT 16 08/05/2016: BUN 16; Creatinine, Ser 0.65; Hemoglobin 13.6; Platelets 241; Potassium 4.2; Sodium 134    Lipid Panel Lab Results  Component Value Date   CHOL 140 08/02/2016   HDL 35 (L) 08/02/2016   LDLCALC 71 08/02/2016   TRIG 172 (H) 08/02/2016      Wt Readings from Last 3 Encounters:  08/12/16 206 lb (93.4 kg)  08/10/16 183 lb (83 kg)  08/04/16 204 lb 12.8 oz (92.9 kg)       ASSESSMENT AND PLAN:  CAD in native artery - Plan: EKG 12-Lead Recommended she take NTG as needed for chest pain. Recent cardiac catheterization with stent placed to her RCA symptoms are atypical in nature but she does have small vessel disease not amenable to intervention  No further workup at this time. Continue current medication regimen. Noncompliant with her aspirin. Stressed importance of taking her aspirin daily. Samples provided today  Essential hypertension - Plan: EKG 12-Lead Blood pressure borderline elevated, no medication changes made today Significant stress at home   Chronic diastolic CHF (congestive heart failure) ([place]) - Plan: EKG 12-Lead Encouraged her to stay on her Lasix daily. Appears euvolemic  Pain in both lower extremities Has chronic pain, takes medication  Mixed  hyperlipidemia Encouraged her to stay on her Lipitor 40 mg daily for now  Poorly controlled type 2 diabetes mellitus with complication ([place]) Dietary guide provided Long discussion concerning the desperate need to get her sugars down She has missed several appointments with local doctors secondary to transportation issues  Morbid obesity ([place]) We have encouraged continued exercise, careful diet management in an effort to lose weight.  Smoker /tobacco use   long discussion with her concerning her need to quit smoking  He is down to  5 a day    Total encounter time more than 25 minutes  Greater than 50% was spent in counseling and coordination of care with the patient   Disposition:   F/U  6 months   Orders Placed This Encounter  Procedures  . EKG 12-Lead     Signed, Esmond Plants, M.D., Ph.D. 08/12/2016  [place], [place] ]

## 2016-08-10 NOTE — ED Notes (Signed)
See triage note  States she developed couple of small red raised areas to back of neck a few days ago  Now areas are larger  Denies any fever   But it is causing a headache

## 2016-08-10 NOTE — ED Triage Notes (Signed)
Pt presents POV c/o headache and abscesses to back of head beginning last week while in hospital. Took motrin without relief.

## 2016-08-11 ENCOUNTER — Other Ambulatory Visit: Payer: Self-pay | Admitting: Cardiovascular Disease

## 2016-08-12 ENCOUNTER — Ambulatory Visit (INDEPENDENT_AMBULATORY_CARE_PROVIDER_SITE_OTHER): Payer: Medicare Other | Admitting: Cardiovascular Disease

## 2016-08-12 ENCOUNTER — Encounter: Payer: Self-pay | Admitting: Cardiovascular Disease

## 2016-08-12 ENCOUNTER — Emergency Department
Admission: EM | Admit: 2016-08-12 | Discharge: 2016-08-12 | Disposition: A | Discharge status: Home / Self Care | Payer: Medicare Other | Attending: Emergency Medicine | Admitting: Emergency Medicine

## 2016-08-12 ENCOUNTER — Encounter: Payer: Self-pay | Admitting: Medical Oncology

## 2016-08-12 VITALS — BP 116/68 | HR 83 | Ht 62.0 in | Wt 206.0 lb

## 2016-08-12 DIAGNOSIS — I10 Essential (primary) hypertension: Secondary | ICD-10-CM

## 2016-08-12 DIAGNOSIS — Z5189 Encounter for other specified aftercare: Secondary | ICD-10-CM

## 2016-08-12 DIAGNOSIS — I2510 Atherosclerotic heart disease of native coronary artery without angina pectoris: Secondary | ICD-10-CM | POA: Insufficient documentation

## 2016-08-12 DIAGNOSIS — E1165 Type 2 diabetes mellitus with hyperglycemia: Secondary | ICD-10-CM

## 2016-08-12 DIAGNOSIS — E119 Type 2 diabetes mellitus without complications: Secondary | ICD-10-CM | POA: Insufficient documentation

## 2016-08-12 DIAGNOSIS — Z955 Presence of coronary angioplasty implant and graft: Secondary | ICD-10-CM

## 2016-08-12 DIAGNOSIS — Z4800 Encounter for change or removal of nonsurgical wound dressing: Secondary | ICD-10-CM | POA: Insufficient documentation

## 2016-08-12 DIAGNOSIS — I25118 Atherosclerotic heart disease of native coronary artery with other forms of angina pectoris: Secondary | ICD-10-CM

## 2016-08-12 DIAGNOSIS — Z79899 Other long term (current) drug therapy: Secondary | ICD-10-CM | POA: Diagnosis not present

## 2016-08-12 DIAGNOSIS — Z09 Encounter for follow-up examination after completed treatment for conditions other than malignant neoplasm: Secondary | ICD-10-CM

## 2016-08-12 DIAGNOSIS — Z794 Long term (current) use of insulin: Secondary | ICD-10-CM | POA: Insufficient documentation

## 2016-08-12 DIAGNOSIS — F17210 Nicotine dependence, cigarettes, uncomplicated: Secondary | ICD-10-CM | POA: Diagnosis not present

## 2016-08-12 DIAGNOSIS — Z7982 Long term (current) use of aspirin: Secondary | ICD-10-CM | POA: Diagnosis not present

## 2016-08-12 DIAGNOSIS — L0211 Cutaneous abscess of neck: Secondary | ICD-10-CM | POA: Insufficient documentation

## 2016-08-12 DIAGNOSIS — I200 Unstable angina: Secondary | ICD-10-CM

## 2016-08-12 DIAGNOSIS — I209 Angina pectoris, unspecified: Secondary | ICD-10-CM | POA: Diagnosis not present

## 2016-08-12 DIAGNOSIS — Z720 Tobacco use: Secondary | ICD-10-CM | POA: Diagnosis not present

## 2016-08-12 DIAGNOSIS — E782 Mixed hyperlipidemia: Secondary | ICD-10-CM | POA: Diagnosis not present

## 2016-08-12 DIAGNOSIS — E118 Type 2 diabetes mellitus with unspecified complications: Secondary | ICD-10-CM | POA: Diagnosis not present

## 2016-08-12 MED ORDER — SULFAMETHOXAZOLE-TRIMETHOPRIM 800-160 MG PO TABS: 1.0000 | ORAL_TABLET | Freq: Two times a day (BID) | ORAL | 0 refills | Status: DC

## 2016-08-12 NOTE — ED Notes (Signed)
See triage note  Here for wound check packing remains in place to back of neck  Area is red and swollen

## 2016-08-12 NOTE — ED Triage Notes (Signed)
Pt reports she was here 2 days ago and had abscess drained, here for packing removal

## 2016-08-12 NOTE — ED Provider Notes (Signed)
[facility] Emergency Department Provider Note  ____________________________________________  Time seen: Approximately 12:11 PM  I have reviewed the triage vital signs and the nursing notes.   HISTORY  Chief Complaint Wound Check    HPI Jennifer [name] is a 56 y.o. female presents to emergency Department for repacking of abscess. Patient states that area is still tender to palpation. Her headaches have resolved. Packing is still in place and she is unsure of drainage. The two smaller areas have improved.She has been taking the clindamycin as prescribed. She denies fever, shortness of breath, chest pain, nausea, vomiting, abdominal pain.   Past Medical History:  Diagnosis Date  . Anxiety   . Arthritis    "qwhere" (06/14/2014)  . Bipolar disorder ([place])   . CAD (coronary artery disease)    a. cardiac cath: 2010: 95% mRCA s/p PCI/DES, 60% RPDA; b. cath 05/2014, widely patent LM, mild to mod dz in LAD and LCx, 90% dRCA s/p PCI/DES; c. cath 02/2015: severe ISR proximal segement of distal RCA stent s/p PCI/DES, nl EF by nuc 11/16, mildly elevated LVEDP   . Chronic bronchitis ([place])    "get it q yr"  . Daily headache    "when my blood pressure is up" (06/14/2014)  . Depression   . GERD (gastroesophageal reflux disease)   . Heart murmur   . High cholesterol   . History of stomach ulcers   . Hypertension   . Insomnia   . Kidney stones   . Kidney stones   . Migraine    "haven't had them in a good while; did get them 1-2 times/yr" (06/14/2014)  . Myocardial infarction ([place]) 2008; ~ 2012  . Pneumonia    "get it 1-2 times/year" (06/14/2014)  . RLS (restless legs syndrome)   . Seizures ([place])   . Sleep apnea    "they want me to do the lab but I haven't" (06/14/2014)  . Stroke [facility])    "they say I've had several" (06/14/2014)  . Type II diabetes mellitus ([place])   . Urinary, incontinence, stress female     Patient Active Problem List   Diagnosis Date Noted  .  Unstable angina ([place]) 08/03/2016  . Morbid obesity ([place]) 05/02/2016  . Chest pain with moderate risk for cardiac etiology 03/14/2015  . CAD (coronary artery disease)   . Hospital discharge follow-up 03/07/2015  . Poorly controlled type 2 diabetes mellitus with complication ([place]) 16/01/9603  . Hyperlipidemia 06/22/2014  . Stented coronary artery   . Chronic pain syndrome   . Chest pain at rest 06/14/2014  . Acute bronchitis 06/14/2014  . Tobacco abuse 06/14/2014  . Essential hypertension 06/14/2014  . GERD (gastroesophageal reflux disease) 06/14/2014  . Bipolar disorder ([place]) 06/14/2014  . Chest pain 06/14/2014    Past Surgical History:  Procedure Laterality Date  . ABDOMINAL HYSTERECTOMY  1984   "I've got 1 ovary left"  . CARDIAC CATHETERIZATION N/A 02/26/2015   Procedure: Left Heart Cath and Coronary Angiography;  Surgeon: Wellington Hampshire, MD;  Location: [place] [facility];  Service: Cardiovascular;  Laterality: N/A;  . CARDIAC CATHETERIZATION N/A 02/26/2015   Procedure: Coronary Stent Intervention;  Surgeon: Wellington Hampshire, MD;  Location: [place] [facility];  Service: Cardiovascular;  Laterality: N/A;  . CESAREAN SECTION  1984  . CORONARY ANGIOPLASTY WITH STENT PLACEMENT  2008; ~ 2012   "1; 1"  . CORONARY STENT INTERVENTION N/A 08/04/2016   Procedure: Coronary Stent Intervention;  Surgeon: Wellington Hampshire,  MD;  Location: [place] [facility];  Service: Cardiovascular;  Laterality: N/A;  . DILATION AND CURETTAGE OF UTERUS    . EXTRACORPOREAL SHOCK WAVE LITHOTRIPSY  X 2  . KNEE ARTHROSCOPY Left   . LEFT HEART CATH Bilateral 08/04/2016   Procedure: Left Heart Cath poss PCI;  Surgeon: Wellington Hampshire, MD;  Location: [place] [facility];  Service: Cardiovascular;  Laterality: Bilateral;  . LEFT HEART CATHETERIZATION WITH CORONARY ANGIOGRAM N/A 06/15/2014   Procedure: LEFT HEART CATHETERIZATION WITH CORONARY ANGIOGRAM;  Surgeon: Jettie Booze, MD;  Location: [facility] CATH LAB;   Service: Cardiovascular;  Laterality: N/A;  . PERCUTANEOUS CORONARY STENT INTERVENTION (PCI-S)  06/15/2014   Procedure: PERCUTANEOUS CORONARY STENT INTERVENTION (PCI-S);  Surgeon: Jettie Booze, MD;  Location: [facility] CATH LAB;  Service: Cardiovascular;;  . TONSILLECTOMY    . TUBAL LIGATION      Prior to Admission medications   Medication Sig Start Date End Date Taking? Authorizing Provider  amLODipine (NORVASC) 2.5 MG tablet Take 1 tablet (2.5 mg total) by mouth daily. 07/12/15   Minna Merritts, MD  aspirin EC 81 MG EC tablet Take 1 tablet (81 mg total) by mouth daily. 02/27/15   Gladstone Lighter, MD  atorvastatin (LIPITOR) 40 MG tablet take 1 tablet by mouth once daily AT 6PM AS DIRECTED 04/28/16   Minna Merritts, MD  Blood Glucose Monitoring Suppl W/DEVICE KIT Test 1-2 times daily; E11.65 diagnosis 03/02/15   Rubbie Battiest, NP  BUTRANS 10 MCG/HR PTWK patch Place 1 patch onto the skin once a week. 07/03/16   Historical Provider, MD  clindamycin (CLEOCIN) 300 MG capsule Take 1 capsule (300 mg total) by mouth 4 (four) times daily. 08/10/16 08/20/16  Laban Emperor, PA-C  clopidogrel (PLAVIX) 75 MG tablet Take 1 tablet (75 mg total) by mouth daily with breakfast. Patient taking differently: Take 150 mg by mouth daily.  07/12/15   Minna Merritts, MD  COMBIVENT RESPIMAT 20-100 MCG/ACT AERS respimat Inhale 1 puff into the lungs every 6 (six) hours as needed for wheezing or shortness of breath. 02/27/15   Gladstone Lighter, MD  cyclobenzaprine (FLEXERIL) 10 MG tablet Take 1 tablet by mouth 3 (three) times daily. 07/17/16   Historical Provider, MD  furosemide (LASIX) 20 MG tablet take 1 tablet by mouth once daily 04/23/16   Areta Haber Dunn, PA-C  gabapentin (NEURONTIN) 300 MG capsule Take 3 capsules (900 mg total) by mouth 3 (three) times daily. Patient taking differently: Take 900 mg by mouth 4 (four) times daily.  02/27/15   Gladstone Lighter, MD  insulin glargine (LANTUS) 100 UNIT/ML injection Inject 52 Units  into the skin at bedtime.    Historical Provider, MD  lisinopril (PRINIVIL,ZESTRIL) 40 MG tablet take 1 tablet by mouth once daily 07/18/16   Minna Merritts, MD  metoprolol (TOPROL-XL) 200 MG 24 hr tablet take 1 tablet by mouth once daily with OR IMMEDIETLY FOLLOWING A MEAL 08/11/16   Minna Merritts, MD  Multiple Vitamins-Minerals (MULTIVITAMIN WITH MINERALS) tablet Take 1 tablet by mouth daily.    Historical Provider, MD  nicotine (NICODERM CQ - DOSED IN MG/24 HOURS) 21 mg/24hr patch Place 1 patch (21 mg total) onto the skin daily. 11/28/15   Areta Haber Dunn, PA-C  nitroGLYCERIN (NITROSTAT) 0.4 MG SL tablet Place 1 tablet (0.4 mg total) under the tongue every 5 (five) minutes as needed for chest pain (Do not give more than 3 SL tablets in 15 minutes.). 08/05/16  Fritzi Mandes, MD  oxyCODONE (OXY IR/ROXICODONE) 5 MG immediate release tablet Take 1 tablet (5 mg total) by mouth every 6 (six) hours as needed for moderate pain. Patient taking differently: Take 10 mg by mouth every 4 (four) hours as needed for moderate pain.  02/27/15   Gladstone Lighter, MD  sulfamethoxazole-trimethoprim (BACTRIM DS,SEPTRA DS) 800-160 MG tablet Take 1 tablet by mouth 2 (two) times daily. 08/12/16   Laban Emperor, PA-C    Allergies Lithium; Tegretol [carbamazepine]; Levaquin [levofloxacin in d5w]; and Lodine [etodolac]  Family History  Problem Relation Age of Onset  . Family history unknown: Yes    Social History Social History  Substance Use Topics  . Smoking status: Current Every Day Smoker    Packs/day: 1.00    Years: 35.00    Types: Cigarettes  . Smokeless tobacco: Never Used     Comment: Patches  . Alcohol use 0.0 oz/week     Comment: 06/14/2014 "might drink twice/yr"     Review of Systems  Constitutional: No fever/chills ENT: No upper respiratory complaints. Cardiovascular: No chest pain. Respiratory: No SOB. Gastrointestinal: No abdominal pain.  No nausea, no vomiting.  Musculoskeletal: Positive for  neck pain. Neurological: Negative for headaches, numbness or tingling   ____________________________________________   PHYSICAL EXAM:  VITAL SIGNS: ED Triage Vitals  Enc Vitals Group     BP 08/12/16 0913 135/81     Pulse Rate 08/12/16 0913 86     Resp 08/12/16 0913 18     Temp 08/12/16 0913 98.4 F (36.9 C)     Temp Source 08/12/16 0913 Oral     SpO2 08/12/16 0913 98 %     Weight 08/12/16 0914 206 lb (93.4 kg)     Height 08/12/16 0914 '5\' 2"'$  (1.575 m)     Head Circumference --      Peak Flow --      Pain Score 08/12/16 0913 6     Pain Loc --      Pain Edu? --      Excl. in [place]? --      Constitutional: Alert and oriented. Well appearing and in no acute distress. Eyes: Conjunctivae are normal. PERRL. EOMI. Head: Atraumatic. ENT:      Ears:      Nose: No congestion/rhinnorhea.      Mouth/Throat: Mucous membranes are moist.  Neck: No stridor.  Cardiovascular: Normal rate, regular rhythm.  Good peripheral circulation. Respiratory: Normal respiratory effort without tachypnea or retractions. Lungs CTAB. Good air entry to the bases with no decreased or absent breath sounds. Musculoskeletal: Full range of motion to all extremities. No gross deformities appreciated. Neurologic:  Normal speech and language. No gross focal neurologic deficits are appreciated.  Skin:  Skin is warm, dry. 2 cm x 2 cm area of swelling and tenderness to right side of neck. Packing material in place. 2, 1/2 cm by 1/2 cm areas of erythema in middle of neck. Nontender to palpation. Scab in middle of erythema.  ____________________________________________   LABS (all labs ordered are listed, but only abnormal results are displayed)  Labs Reviewed - No data to display ____________________________________________  EKG   ____________________________________________  RADIOLOGY  No results found.  ____________________________________________    PROCEDURES  Procedure(s) performed:     Procedures  REPACKING Performed by: Laban Emperor Consent: Verbal consent obtained. Risks and benefits: risks, benefits and alternatives were discussed Type: abscess  Body area: neck Complexity: complex Drainage: purulent Drainage amount: 2cc Packing material: 1/4 in iodoform gauze  Patient tolerance: Patient tolerated the procedure well with no immediate complications.    Medications - No data to display   ____________________________________________   INITIAL IMPRESSION / ASSESSMENT AND PLAN / ED COURSE  Pertinent labs & imaging results that were available during my care of the patient were reviewed by me and considered in my medical decision making (see chart for details).  Review of the [place] CSRS was performed in accordance of the [place] prior to dispensing any controlled drugs.    Patient's diagnosis is consistent with abscess. Vital signs and exam are reassuring. Abscess does not seem to be improving. Purulent drainage was removed from abscess. Patient states that she felt better this time than last time after drainage. Patient was placed on clindamycin because she was recently in the hospital so there was concern for MRSA. She'll be switched to Bactrim. Other spots on neck seem to be improving. Patient will return in 2 days for wound recheck. Patient is given ED precautions to return to the ED for any worsening or new symptoms.  ____________________________________________  FINAL CLINICAL IMPRESSION(S) / ED DIAGNOSES  Final diagnoses:  Abscess re-check      NEW MEDICATIONS STARTED DURING THIS VISIT:  Discharge Medication List as of 08/12/2016 11:17 AM    START taking these medications   Details  sulfamethoxazole-trimethoprim (BACTRIM DS,SEPTRA DS) 800-160 MG tablet Take 1 tablet by mouth 2 (two) times daily., Starting Tue 08/12/2016, Print            This chart was dictated using voice recognition software/Dragon. Despite best efforts to proofread,  errors can occur which can change the meaning. Any change was purely unintentional.    Laban Emperor, PA-C 08/12/16 [place], MD 08/13/16 2351

## 2016-08-12 NOTE — Patient Instructions (Addendum)
Medication Instructions:   No medication changes made  Labwork:  No new labs needed  Testing/Procedures:  No further testing at this time   I recommend watching educational videos on topics of interest to you at:       [email or web address]  Enter code: [email or web address]    Follow-Up: It was a pleasure seeing you in the office today. Please call us if you have new issues that need to be addressed before your next appt.  [phone]  Your physician wants you to follow-up in: 6 months.  You will receive a reminder letter in the mail two months in advance. If you don't receive a letter, please call our office to schedule the follow-up appointment.  If you need a refill on your cardiac medications before your next appointment, please call your pharmacy.        Steps to Quit Smoking Smoking tobacco can be bad for your health. It can also affect almost every organ in your body. Smoking puts you and people around you at risk for many serious long-lasting (chronic) diseases. Quitting smoking is hard, but it is one of the best things that you can do for your health. It is never too late to quit. What are the benefits of quitting smoking? When you quit smoking, you lower your risk for getting serious diseases and conditions. They can include:  Lung cancer or lung disease.  Heart disease.  Stroke.  Heart attack.  Not being able to have children (infertility).  Weak bones (osteoporosis) and broken bones (fractures). If you have coughing, wheezing, and shortness of breath, those symptoms may get better when you quit. You may also get sick less often. If you are pregnant, quitting smoking can help to lower your chances of having a baby of low birth weight. What can I do to help me quit smoking? Talk with your doctor about what can help you quit smoking. Some things you can do (strategies) include:  Quitting smoking totally, instead of slowly cutting back how much you smoke over a period of  time.  Going to in-person counseling. You are more likely to quit if you go to many counseling sessions.  Using resources and support systems, such as:  Online chats with a Veterinary surgeon.  Phone quitlines.  Printed Materials engineer.  Support groups or group counseling.  Text messaging programs.  Mobile phone apps or applications.  Taking medicines. Some of these medicines may have nicotine in them. If you are pregnant or breastfeeding, do not take any medicines to quit smoking unless your doctor says it is okay. Talk with your doctor about counseling or other things that can help you. Talk with your doctor about using more than one strategy at the same time, such as taking medicines while you are also going to in-person counseling. This can help make quitting easier. What things can I do to make it easier to quit? Quitting smoking might feel very hard at first, but there is a lot that you can do to make it easier. Take these steps:  Talk to your family and friends. Ask them to support and encourage you.  Call phone quitlines, reach out to support groups, or work with a Veterinary surgeon.  Ask people who smoke to not smoke around you.  Avoid places that make you want (trigger) to smoke, such as:  Bars.  Parties.  Smoke-break areas at work.  Spend time with people who do not smoke.  Lower the stress in your life. Stress can make you  want to smoke. Try these things to help your stress:  Getting regular exercise.  Deep-breathing exercises.  Yoga.  Meditating.  Doing a body scan. To do this, close your eyes, focus on one area of your body at a time from head to toe, and notice which parts of your body are tense. Try to relax the muscles in those areas.  Download or buy apps on your mobile phone or tablet that can help you stick to your quit plan. There are many free apps, such as QuitGuide from the [employer] developer for Disease Control and Prevention). You can find more support from  smokefree.gov and other websites. This information is not intended to replace advice given to you by your health care provider. Make sure you discuss any questions you have with your health care provider. Document Released: 02/01/2009 Document Revised: 12/04/2015 Document Reviewed: 08/22/2014 Elsevier Interactive Patient Education  2017 Elsevier Inc.  Secondhand Smoke What is secondhand smoke? Secondhand smoke is smoke that comes from burning tobacco. It could be the smoke from a cigarette, a pipe, or a cigar. Even if you are not the one smoking, secondhand smoke exposes you to the dangers of smoking. This is called involuntary, or passive, smoking. There are two types of secondhand smoke:  Sidestream smoke is the smoke that comes off the lighted end of a cigarette, pipe, or cigar.  This type of smoke has the highest amount of cancer-causing agents (carcinogens).  The particles in sidestream smoke are smaller. They get into your lungs more easily.  Mainstream smoke is the smoke that is exhaled by a person who is smoking.  This type of smoke is also dangerous to your health. How can secondhand smoke affect my health? Studies show that there is no safe level of secondhand smoke. This smoke contains thousands of chemicals. At least 69 of them are known to cause cancer. Secondhand smoke can also cause many other health problems. It has been linked to:  Lung cancer.  Cancer of the voice box (larynx) or throat.  Cancer of the sinuses.  Brain cancer.  Bladder cancer.  Stomach cancer.  Breast cancer.  White blood cell cancers (lymphoma and leukemia).  Brain and liver tumors in children.  Heart disease and stroke in adults.  Pregnancy loss (miscarriage).  Diseases in children, such as:  Asthma.  Lung infections.  Ear infections.  Sudden infant death syndrome (SIDS).  Slow growth. Where can I be at risk for exposure to secondhand smoke?  For adults, the workplace is the  main source of exposure to secondhand smoke.  Your workplace should have a policy separating smoking areas from nonsmoking areas.  Smoking areas should have a system for ventilating and cleaning the air.  For children, the home may be the most dangerous place for exposure to secondhand smoke.  Children who live in apartment buildings may be at risk from smoke drifting from hallways or other people's homes.  For everyone, many public places are possible sources of exposure to secondhand smoke.  These places include restaurants, shopping centers, and parks. How can I reduce my risk for exposure to secondhand smoke? The most important thing you can do is not smoke. Discourage family members from smoking. Other ways to reduce exposure for you and your family include the following:  Keep your home smoke free.  Make sure your child care providers do not smoke.  Warn your child about the dangers of smoking and secondhand smoke.  Do not allow smoking in your  car. When someone smokes in a car, all the damaging chemicals from the smoke are confined in a small area.  Avoid public places where smoking is allowed. This information is not intended to replace advice given to you by your health care provider. Make sure you discuss any questions you have with your health care provider. Document Released: 05/15/2004 Document Revised: 03/04/2016 Document Reviewed: 07/22/2013 Elsevier Interactive Patient Education  2017 Elsevier Inc.  Health Risks of Smoking Smoking cigarettes is very bad for your health. Tobacco smoke has over 200 known poisons in it. It contains the poisonous gases nitrogen oxide and carbon monoxide. There are over 60 chemicals in tobacco smoke that cause cancer. Smoking is difficult to quit because a chemical in tobacco, called nicotine, causes addiction or dependence. When you smoke and inhale, nicotine is absorbed rapidly into the bloodstream through your lungs. Both inhaled and  non-inhaled nicotine may be addictive. What are the risks of cigarette smoke? Cigarette smokers have an increased risk of many serious medical problems, including:  Lung cancer.  Lung disease, such as pneumonia, bronchitis, and emphysema.  Chest pain (angina) and heart attack because the heart is not getting enough oxygen.  Heart disease and peripheral blood vessel disease.  High blood pressure (hypertension).  Stroke.  Oral cancer, including cancer of the lip, mouth, or voice box.  Bladder cancer.  Pancreatic cancer.  Cervical cancer.  Pregnancy complications, including premature birth.  Stillbirths and smaller newborn babies, birth defects, and genetic damage to sperm.  Early menopause.  Lower estrogen level for women.  Infertility.  Facial wrinkles.  Blindness.  Increased risk of broken bones (fractures).  Senile dementia.  Stomach ulcers and internal bleeding.  Delayed wound healing and increased risk of complications during surgery.  Even smoking lightly shortens your life expectancy by several years. Because of secondhand smoke exposure, children of smokers have an increased risk of the following:  Sudden infant death syndrome (SIDS).  Respiratory infections.  Lung cancer.  Heart disease.  Ear infections. What are the benefits of quitting? There are many health benefits of quitting smoking. Here are some of them:  Within days of quitting smoking, your risk of having a heart attack decreases, your blood flow improves, and your lung capacity improves. Blood pressure, pulse rate, and breathing patterns start returning to normal soon after quitting.  Within months, your lungs may clear up completely.  Quitting for 10 years reduces your risk of developing lung cancer and heart disease to almost that of a nonsmoker.  People who quit may see an improvement in their overall quality of life. How do I quit smoking? Smoking is an addiction with both  physical and psychological effects, and longtime habits can be hard to change. Your health care provider can recommend:  Programs and community resources, which may include group support, education, or talk therapy.  Prescription medicines to help reduce cravings.  Nicotine replacement products, such as patches, gum, and nasal sprays. Use these products only as directed. Do not replace cigarette smoking with electronic cigarettes, which are commonly called e-cigarettes. The safety of e-cigarettes is not known, and some may contain harmful chemicals.  A combination of two or more of these methods. Where to find more information:  American Lung Association: www.lung.org  American Cancer Society: www.cancer.org Summary  Smoking cigarettes is very bad for your health. Cigarette smokers have an increased risk of many serious medical problems, including several cancers, heart disease, and stroke.  Smoking is an addiction with both physical  and psychological effects, and longtime habits can be hard to change.  By stopping right away, you can greatly reduce the risk of medical problems for you and your family.  To help you quit smoking, your health care provider can recommend programs, community resources, prescription medicines, and nicotine replacement products such as patches, gum, and nasal sprays. This information is not intended to replace advice given to you by your health care provider. Make sure you discuss any questions you have with your health care provider. Document Released: 05/15/2004 Document Revised: 04/11/2016 Document Reviewed: 04/11/2016 Elsevier Interactive Patient Education  2017 Elsevier Inc.  Coping with Quitting Smoking Quitting smoking is a physical and mental challenge. You will face cravings, withdrawal symptoms, and temptation. Before quitting, work with your health care provider to make a plan that can help you cope. Preparation can help you quit and keep you from  giving in. How can I cope with cravings? Cravings usually last for 5-10 minutes. If you get through it, the craving will pass. Consider taking the following actions to help you cope with cravings:  Keep your mouth busy:  Chew sugar-free gum.  Suck on hard candies or a straw.  Brush your teeth.  Keep your hands and body busy:  Immediately change to a different activity when you feel a craving.  Squeeze or play with a ball.  Do an activity or a hobby, like making bead jewelry, practicing needlepoint, or working with wood.  Mix up your normal routine.  Take a short exercise break. Go for a quick walk or run up and down stairs.  Spend time in public places where smoking is not allowed.  Focus on doing something kind or helpful for someone else.  Call a friend or family member to talk during a craving.  Join a support group.  Call a quit line, such as 1-800-QUIT-NOW.  Talk with your health care provider about medicines that might help you cope with cravings and make quitting easier for you. How can I deal with withdrawal symptoms? Your body may experience negative effects as it tries to get used to not having nicotine in the system. These effects are called withdrawal symptoms. They may include:  Feeling hungrier than normal.  Trouble concentrating.  Irritability.  Trouble sleeping.  Feeling depressed.  Restlessness and agitation.  Craving a cigarette. 1.  To manage withdrawal symptoms:  Avoid places, people, and activities that trigger your cravings.  Remember why you want to quit.  Get plenty of sleep.  Avoid coffee and other caffeinated drinks. These may worsen some of your symptoms. How can I handle social situations? Social situations can be difficult when you are quitting smoking, especially in the first few weeks. To manage this, you can:  Avoid parties, bars, and other social situations where people might be smoking.  Avoid alcohol.  Leave right  away if you have the urge to smoke.  Explain to your family and friends that you are quitting smoking. Ask for understanding and support.  Plan activities with friends or family where smoking is not an option. What are some ways I can cope with stress? Wanting to smoke may cause stress, and stress can make you want to smoke. Find ways to manage your stress. Relaxation techniques can help. For example:  Breathe slowly and deeply, in through your nose and out through your mouth.  Listen to soothing, relaxing music.  Talk with a family member or friend about your stress.  Light a candle.  Soak  in a bath or take a shower.  Think about a peaceful place. What are some ways I can prevent weight gain? Be aware that many people gain weight after they quit smoking. However, not everyone does. To keep from gaining weight, have a plan in place before you quit and stick to the plan after you quit. Your plan should include:  Having healthy snacks. When you have a craving, it may help to:  Eat plain popcorn, crunchy carrots, celery, or other cut vegetables.  Chew sugar-free gum.  Changing how you eat:  Eat small portion sizes at meals.  Eat 4-6 small meals throughout the day instead of 1-2 large meals a day.  Be mindful when you eat. Do not watch television or do other things that might distract you as you eat.  Exercising regularly:  Make time to exercise each day. If you do not have time for a long workout, do short bouts of exercise for 5-10 minutes several times a day.  Do some form of strengthening exercise, like weight lifting, and some form of aerobic exercise, like running or swimming.  Drinking plenty of water or other low-calorie or no-calorie drinks. Drink 6-8 glasses of water daily, or as much as instructed by your health care provider. Summary  Quitting smoking is a physical and mental challenge. You will face cravings, withdrawal symptoms, and temptation to smoke again.  Preparation can help you as you go through these challenges.  You can cope with cravings by keeping your mouth busy (such as by chewing gum), keeping your body and hands busy, and making calls to family, friends, or a helpline for people who want to quit smoking.  You can cope with withdrawal symptoms by avoiding places where people smoke, avoiding drinks with caffeine, and getting plenty of rest.  Ask your health care provider about the different ways to prevent weight gain, avoid stress, and handle social situations. This information is not intended to replace advice given to you by your health care provider. Make sure you discuss any questions you have with your health care provider. Document Released: 04/04/2016 Document Revised: 04/04/2016 Document Reviewed: 04/04/2016 Elsevier Interactive Patient Education  2017 [employer].

## 2016-08-14 ENCOUNTER — Emergency Department
Admission: EM | Admit: 2016-08-14 | Discharge: 2016-08-14 | Disposition: A | Discharge status: Home / Self Care | Payer: Medicare Other | Attending: Emergency Medicine | Admitting: Emergency Medicine

## 2016-08-14 ENCOUNTER — Encounter: Payer: Self-pay | Admitting: Medical Oncology

## 2016-08-14 DIAGNOSIS — Z7982 Long term (current) use of aspirin: Secondary | ICD-10-CM | POA: Insufficient documentation

## 2016-08-14 DIAGNOSIS — I10 Essential (primary) hypertension: Secondary | ICD-10-CM | POA: Diagnosis not present

## 2016-08-14 DIAGNOSIS — F17210 Nicotine dependence, cigarettes, uncomplicated: Secondary | ICD-10-CM | POA: Insufficient documentation

## 2016-08-14 DIAGNOSIS — Z794 Long term (current) use of insulin: Secondary | ICD-10-CM | POA: Diagnosis not present

## 2016-08-14 DIAGNOSIS — Z4800 Encounter for change or removal of nonsurgical wound dressing: Secondary | ICD-10-CM | POA: Insufficient documentation

## 2016-08-14 DIAGNOSIS — E119 Type 2 diabetes mellitus without complications: Secondary | ICD-10-CM | POA: Insufficient documentation

## 2016-08-14 DIAGNOSIS — Z79899 Other long term (current) drug therapy: Secondary | ICD-10-CM | POA: Insufficient documentation

## 2016-08-14 DIAGNOSIS — I2510 Atherosclerotic heart disease of native coronary artery without angina pectoris: Secondary | ICD-10-CM | POA: Insufficient documentation

## 2016-08-14 DIAGNOSIS — I252 Old myocardial infarction: Secondary | ICD-10-CM | POA: Diagnosis not present

## 2016-08-14 DIAGNOSIS — Z5189 Encounter for other specified aftercare: Secondary | ICD-10-CM

## 2016-08-14 NOTE — ED Triage Notes (Signed)
Pt reports she was seen here 2 days ago and had packing placed to abscess to neck, here for removal and recheck.

## 2016-08-14 NOTE — ED Notes (Signed)
See triage note  Here to have packing removed  States she has had abscess area to neck lanced twice

## 2016-08-14 NOTE — ED Provider Notes (Signed)
[facility] Emergency Department Provider Note  ____________________________________________  Time seen: Approximately 12:57 PM  I have reviewed the triage vital signs and the nursing notes.   HISTORY  Chief Complaint Wound Check    HPI Jennifer [name] is a 56 y.o. female that presents to emergency department for wound recheck and packing removal. Patient had an abscess drained 4 days ago. She began clindamycin and was switched to Bactrim for worsening symptoms. Patient states that in the last 2 days pain has improved greatly. Swelling has improved. She is able to turn her neck, which she was not able to do 2 days ago. She is still having headaches on and off from the pain.She had 2 smaller abscesses on her neck, which have almost disappeared. She denies fever, chills, shortness of breath, chest pain, nausea, vomiting, abdominal pain.   Past Medical History:  Diagnosis Date  . Anxiety   . Arthritis    "qwhere" (06/14/2014)  . Bipolar disorder (HCC)   . CAD (coronary artery disease)    a. cardiac cath: 2010: 95% mRCA s/p PCI/DES, 60% RPDA; b. cath 05/2014, widely patent LM, mild to mod dz in LAD and LCx, 90% dRCA s/p PCI/DES; c. cath 02/2015: severe ISR proximal segement of distal RCA stent s/p PCI/DES, nl EF by nuc 11/16, mildly elevated LVEDP   . Chronic bronchitis (HCC)    "get it q yr"  . Daily headache    "when my blood pressure is up" (06/14/2014)  . Depression   . GERD (gastroesophageal reflux disease)   . Heart murmur   . High cholesterol   . History of stomach ulcers   . Hypertension   . Insomnia   . Kidney stones   . Kidney stones   . Migraine    "haven't had them in a good while; did get them 1-2 times/yr" (06/14/2014)  . Myocardial infarction (HCC) 2008; ~ 2012  . Pneumonia    "get it 1-2 times/year" (06/14/2014)  . RLS (restless legs syndrome)   . Seizures (HCC)   . Sleep apnea    "they want me to do the lab but I haven't" (06/14/2014)  .  Stroke [facility])    "they say I've had several" (06/14/2014)  . Type II diabetes mellitus (HCC)   . Urinary, incontinence, stress female     Patient Active Problem List   Diagnosis Date Noted  . Unstable angina (HCC) 08/03/2016  . Morbid obesity (HCC) 05/02/2016  . Chest pain with moderate risk for cardiac etiology 03/14/2015  . CAD (coronary artery disease)   . Hospital discharge follow-up 03/07/2015  . Poorly controlled type 2 diabetes mellitus with complication (HCC) 01/16/2015  . Hyperlipidemia 06/22/2014  . Stented coronary artery   . Chronic pain syndrome   . Chest pain at rest 06/14/2014  . Acute bronchitis 06/14/2014  . Tobacco abuse 06/14/2014  . Essential hypertension 06/14/2014  . GERD (gastroesophageal reflux disease) 06/14/2014  . Bipolar disorder (HCC) 06/14/2014  . Chest pain 06/14/2014    Past Surgical History:  Procedure Laterality Date  . ABDOMINAL HYSTERECTOMY  1984   "I've got 1 ovary left"  . CARDIAC CATHETERIZATION N/A 02/26/2015   Procedure: Left Heart Cath and Coronary Angiography;  Surgeon: Iran Ouch, MD;  Location: [facility] INVASIVE CV LAB;  Service: Cardiovascular;  Laterality: N/A;  . CARDIAC CATHETERIZATION N/A 02/26/2015   Procedure: Coronary Stent Intervention;  Surgeon: Iran Ouch, MD;  Location: [facility] INVASIVE CV LAB;  Service: Cardiovascular;  Laterality:  N/A;  . [place]  . CORONARY ANGIOPLASTY WITH STENT PLACEMENT  2008; ~ 2012   "1; 1"  . CORONARY STENT INTERVENTION N/A 08/04/2016   Procedure: Coronary Stent Intervention;  Surgeon: Wellington Hampshire, MD;  Location: [place] CV LAB;  Service: Cardiovascular;  Laterality: N/A;  . DILATION AND CURETTAGE OF UTERUS    . EXTRACORPOREAL SHOCK WAVE LITHOTRIPSY  X 2  . KNEE ARTHROSCOPY Left   . LEFT HEART CATH Bilateral 08/04/2016   Procedure: Left Heart Cath poss PCI;  Surgeon: Wellington Hampshire, MD;  Location: [place] CV LAB;  Service: Cardiovascular;  Laterality:  Bilateral;  . LEFT HEART CATHETERIZATION WITH CORONARY ANGIOGRAM N/A 06/15/2014   Procedure: LEFT HEART CATHETERIZATION WITH CORONARY ANGIOGRAM;  Surgeon: Jettie Booze, MD;  Location: [facility] CATH LAB;  Service: Cardiovascular;  Laterality: N/A;  . PERCUTANEOUS CORONARY STENT INTERVENTION (PCI-S)  06/15/2014   Procedure: PERCUTANEOUS CORONARY STENT INTERVENTION (PCI-S);  Surgeon: Jettie Booze, MD;  Location: [facility] CATH LAB;  Service: Cardiovascular;;  . TONSILLECTOMY    . TUBAL LIGATION      Prior to Admission medications   Medication Sig Start Date End Date Taking? Authorizing Provider  amLODipine (NORVASC) 2.5 MG tablet Take 1 tablet (2.5 mg total) by mouth daily. 07/12/15   Minna Merritts, MD  aspirin EC 81 MG EC tablet Take 1 tablet (81 mg total) by mouth daily. 02/27/15   Gladstone Lighter, MD  atorvastatin (LIPITOR) 40 MG tablet take 1 tablet by mouth once daily AT 6PM AS DIRECTED 04/28/16   Minna Merritts, MD  Blood Glucose Monitoring Suppl W/DEVICE KIT Test 1-2 times daily; E11.65 diagnosis 03/02/15   Rubbie Battiest, NP  BUTRANS 10 MCG/HR PTWK patch Place 1 patch onto the skin once a week. 07/03/16   Historical Provider, MD  clindamycin (CLEOCIN) 300 MG capsule Take 1 capsule (300 mg total) by mouth 4 (four) times daily. 08/10/16 08/20/16  Laban Emperor, PA-C  clopidogrel (PLAVIX) 75 MG tablet Take 1 tablet (75 mg total) by mouth daily with breakfast. Patient taking differently: Take 150 mg by mouth daily.  07/12/15   Minna Merritts, MD  COMBIVENT RESPIMAT 20-100 MCG/ACT AERS respimat Inhale 1 puff into the lungs every 6 (six) hours as needed for wheezing or shortness of breath. 02/27/15   Gladstone Lighter, MD  cyclobenzaprine (FLEXERIL) 10 MG tablet Take 1 tablet by mouth 3 (three) times daily. 07/17/16   Historical Provider, MD  furosemide (LASIX) 20 MG tablet take 1 tablet by mouth once daily 04/23/16   Areta Haber Dunn, PA-C  gabapentin (NEURONTIN) 300 MG capsule Take 3 capsules (900 mg  total) by mouth 3 (three) times daily. Patient taking differently: Take 900 mg by mouth 4 (four) times daily.  02/27/15   Gladstone Lighter, MD  insulin glargine (LANTUS) 100 UNIT/ML injection Inject 52 Units into the skin at bedtime.    Historical Provider, MD  lisinopril (PRINIVIL,ZESTRIL) 40 MG tablet take 1 tablet by mouth once daily 07/18/16   Minna Merritts, MD  metoprolol (TOPROL-XL) 200 MG 24 hr tablet take 1 tablet by mouth once daily with OR IMMEDIETLY FOLLOWING A MEAL 08/11/16   Minna Merritts, MD  Multiple Vitamins-Minerals (MULTIVITAMIN WITH MINERALS) tablet Take 1 tablet by mouth daily.    Historical Provider, MD  nicotine (NICODERM CQ - DOSED IN MG/24 HOURS) 21 mg/24hr patch Place 1 patch (21 mg total) onto the skin daily. 11/28/15   Rise Mu,  PA-C  nitroGLYCERIN (NITROSTAT) 0.4 MG SL tablet Place 1 tablet (0.4 mg total) under the tongue every 5 (five) minutes as needed for chest pain (Do not give more than 3 SL tablets in 15 minutes.). 08/05/16   Enedina Finner, MD  oxyCODONE (OXY IR/ROXICODONE) 5 MG immediate release tablet Take 1 tablet (5 mg total) by mouth every 6 (six) hours as needed for moderate pain. Patient taking differently: Take 10 mg by mouth every 4 (four) hours as needed for moderate pain.  02/27/15   Enid Baas, MD  sulfamethoxazole-trimethoprim (BACTRIM DS,SEPTRA DS) 800-160 MG tablet Take 1 tablet by mouth 2 (two) times daily. 08/12/16   Enid Derry, PA-C    Allergies Lithium; Tegretol [carbamazepine]; Levaquin [levofloxacin in d5w]; and Lodine [etodolac]  Family History  Problem Relation Age of Onset  . Family history unknown: Yes    Social History Social History  Substance Use Topics  . Smoking status: Current Every Day Smoker    Packs/day: 1.00    Years: 35.00    Types: Cigarettes  . Smokeless tobacco: Never Used     Comment: Patches  . Alcohol use 0.0 oz/week     Comment: 06/14/2014 "might drink twice/yr"     Review of Systems   Constitutional: No fever/chills Cardiovascular: No chest pain. Respiratory:No SOB. Gastrointestinal: No abdominal pain.  No nausea, no vomiting.  Musculoskeletal: Negative for musculoskeletal pain. Neurological: Negative for  numbness or tingling   ____________________________________________   PHYSICAL EXAM:  VITAL SIGNS: ED Triage Vitals  Enc Vitals Group     BP 08/14/16 0823 (!) 141/81     Pulse Rate 08/14/16 0823 80     Resp 08/14/16 0823 20     Temp 08/14/16 0823 97.4 F (36.3 C)     Temp Source 08/14/16 0823 Oral     SpO2 08/14/16 0823 96 %     Weight 08/14/16 0822 206 lb (93.4 kg)     Height 08/14/16 0822 5\' 2"  (1.575 m)     Head Circumference --      Peak Flow --      Pain Score 08/14/16 0822 7     Pain Loc --      Pain Edu? --      Excl. in GC? --      Constitutional: Alert and oriented. Well appearing and in no acute distress. Eyes: Conjunctivae are normal. PERRL. EOMI. Head: Atraumatic. ENT:      Ears:      Nose: No congestion/rhinnorhea.      Mouth/Throat: Mucous membranes are moist.  Neck: No stridor. Full ROM.  Cardiovascular: Normal rate, regular rhythm.  Good peripheral circulation. Respiratory: Normal respiratory effort without tachypnea or retractions. Lungs CTAB. Good air entry to the bases with no decreased or absent breath sounds. Musculoskeletal: Full range of motion to all extremities. No gross deformities appreciated. Neurologic:  Normal speech and language. No gross focal neurologic deficits are appreciated.  Skin:  Skin is warm, dry. 1 cm x 1 cm area of swelling and tenderness to right side of neck. Minimal drainage present. Mild tenderness to palpation around drainage site.    ____________________________________________   LABS (all labs ordered are listed, but only abnormal results are displayed)  Labs Reviewed - No data to  display ____________________________________________  EKG   ____________________________________________  RADIOLOGY  No results found.  ____________________________________________    PROCEDURES  Procedure(s) performed:    Procedures  Packing material was removed and replaced in ED.  Medications - No data  to display   ____________________________________________   INITIAL IMPRESSION / ASSESSMENT AND PLAN / ED COURSE  Pertinent labs & imaging results that were available during my care of the patient were reviewed by me and considered in my medical decision making (see chart for details).  Review of the [place] CSRS was performed in accordance of the [place] prior to dispensing any controlled drugs.     Patient's diagnosis is consistent with abscess. Vital signs and exam are reassuring. Patient is afebrile. Packing material was removed and repacked in ED. Swelling and pain have improved in the last 2 days and I expect this to continue. Patient is to continue Bactrim. Patient will return in 2 days for packing removal and wound recheck. Patient is given ED precautions to return to the ED for any worsening or new symptoms.     ____________________________________________  FINAL CLINICAL IMPRESSION(S) / ED DIAGNOSES  Final diagnoses:  Encounter for wound re-check      NEW MEDICATIONS STARTED DURING THIS VISIT:  Discharge Medication List as of 08/14/2016 10:18 AM          This chart was dictated using voice recognition software/Dragon. Despite best efforts to proofread, errors can occur which can change the meaning. Any change was purely unintentional.    Laban Emperor, PA-C 08/14/16 1302    Earleen Newport, MD 08/14/16 1414

## 2016-08-27 ENCOUNTER — Other Ambulatory Visit: Payer: Self-pay | Admitting: Cardiovascular Disease

## 2016-09-30 ENCOUNTER — Other Ambulatory Visit: Payer: Self-pay | Admitting: Cardiovascular Disease

## 2016-10-16 ENCOUNTER — Other Ambulatory Visit: Payer: Self-pay | Admitting: Cardiovascular Disease

## 2016-11-20 ENCOUNTER — Emergency Department
Admission: EM | Admit: 2016-11-20 | Discharge: 2016-11-21 | Disposition: A | Discharge status: Home / Self Care | Payer: Medicare Other | Attending: Emergency Medicine | Admitting: Emergency Medicine

## 2016-11-20 ENCOUNTER — Encounter: Payer: Self-pay | Admitting: Emergency Medicine

## 2016-11-20 DIAGNOSIS — F17210 Nicotine dependence, cigarettes, uncomplicated: Secondary | ICD-10-CM | POA: Insufficient documentation

## 2016-11-20 DIAGNOSIS — I10 Essential (primary) hypertension: Secondary | ICD-10-CM | POA: Diagnosis not present

## 2016-11-20 DIAGNOSIS — E119 Type 2 diabetes mellitus without complications: Secondary | ICD-10-CM | POA: Diagnosis not present

## 2016-11-20 DIAGNOSIS — Z794 Long term (current) use of insulin: Secondary | ICD-10-CM | POA: Diagnosis not present

## 2016-11-20 DIAGNOSIS — L02811 Cutaneous abscess of head [any part, except face]: Secondary | ICD-10-CM

## 2016-11-20 DIAGNOSIS — I259 Chronic ischemic heart disease, unspecified: Secondary | ICD-10-CM | POA: Diagnosis not present

## 2016-11-20 LAB — GLUCOSE, CAPILLARY: Glucose-Capillary: 461 mg/dL — ABNORMAL HIGH (ref 65–99)

## 2016-11-20 MED ORDER — OXYCODONE-ACETAMINOPHEN 5-325 MG PO TABS
1.0000 | ORAL_TABLET | Freq: Once | ORAL | Status: AC
  Administered 2016-11-20: 1 via ORAL
  Filled 2016-11-20: qty 1

## 2016-11-20 MED ORDER — INSULIN ASPART 100 UNIT/ML ~~LOC~~ SOLN
6.0000 [IU] | Freq: Once | SUBCUTANEOUS | Status: AC
  Administered 2016-11-20: 6 [IU] via INTRAVENOUS
  Filled 2016-11-20: qty 1

## 2016-11-20 MED ORDER — SULFAMETHOXAZOLE-TRIMETHOPRIM 800-160 MG PO TABS
1.0000 | ORAL_TABLET | Freq: Once | ORAL | Status: AC
  Administered 2016-11-20: 1 via ORAL
  Filled 2016-11-20: qty 1

## 2016-11-20 MED ORDER — SODIUM CHLORIDE 0.9 % IV BOLUS (SEPSIS)
1000.0000 mL | Freq: Once | INTRAVENOUS | Status: AC
  Administered 2016-11-21: 1000 mL via INTRAVENOUS

## 2016-11-20 NOTE — ED Triage Notes (Signed)
Pt ambulatory to triage with steady gait, no distress noted. Pt reports having reoccurring abscess' to back of head/neck x1 year with several times having area lanced and packed. Pt to ED today due to abscess to left side posterior head at neck. Pt sts she has pressed on area without an success of releasing contents inside and pt reports having intermittent fevers for past 2 days. Area is swollen without discharge on assessment.

## 2016-11-20 NOTE — ED Provider Notes (Signed)
[facility] Emergency Department Provider Note    First MD Initiated Contact with Patient 11/20/16 2302     (approximate)  I have reviewed the triage vital signs and the nursing notes.   HISTORY  Chief Complaint Abscess    HPI Jennifer [name] is a 56 y.o. female with the following list of chronic medical conditions including diabetes mellitus poorly controlled presents to the emergency department with left occipital "abscess" x 2 days. Patient admits to multiple episodes of abscesses in the past requiring incision and drainage. Patient denies any known history of MRSA. Patient denies any fever. Patient does admit to a "swollen area posterior to the ears bilaterally. Patient states that her current pain score is 9 out of 10.   Past Medical History:  Diagnosis Date  . Anxiety   . Arthritis    "qwhere" (06/14/2014)  . Bipolar disorder ([place])   . CAD (coronary artery disease)    a. cardiac cath: 2010: 95% mRCA s/p PCI/DES, 60% RPDA; b. cath 05/2014, widely patent LM, mild to mod dz in LAD and LCx, 90% dRCA s/p PCI/DES; c. cath 02/2015: severe ISR proximal segement of distal RCA stent s/p PCI/DES, nl EF by nuc 11/16, mildly elevated LVEDP   . Chronic bronchitis ([place])    "get it q yr"  . Daily headache    "when my blood pressure is up" (06/14/2014)  . Depression   . GERD (gastroesophageal reflux disease)   . Heart murmur   . High cholesterol   . History of stomach ulcers   . Hypertension   . Insomnia   . Kidney stones   . Kidney stones   . Migraine    "haven't had them in a good while; did get them 1-2 times/yr" (06/14/2014)  . Myocardial infarction ([place]) 2008; ~ 2012  . Pneumonia    "get it 1-2 times/year" (06/14/2014)  . RLS (restless legs syndrome)   . Seizures ([place])   . Sleep apnea    "they want me to do the lab but I haven't" (06/14/2014)  . Stroke [facility])    "they say I've had several" (06/14/2014)  . Type II diabetes mellitus ([place])   . Urinary,  incontinence, stress female     Patient Active Problem List   Diagnosis Date Noted  . Unstable angina ([place]) 08/03/2016  . Morbid obesity ([place]) 05/02/2016  . Chest pain with moderate risk for cardiac etiology 03/14/2015  . CAD (coronary artery disease)   . Hospital discharge follow-up 03/07/2015  . Poorly controlled type 2 diabetes mellitus with complication ([place]) 16/01/9603  . Hyperlipidemia 06/22/2014  . Stented coronary artery   . Chronic pain syndrome   . Chest pain at rest 06/14/2014  . Acute bronchitis 06/14/2014  . Tobacco abuse 06/14/2014  . Essential hypertension 06/14/2014  . GERD (gastroesophageal reflux disease) 06/14/2014  . Bipolar disorder ([place]) 06/14/2014  . Chest pain 06/14/2014    Past Surgical History:  Procedure Laterality Date  . ABDOMINAL HYSTERECTOMY  1984   "I've got 1 ovary left"  . CARDIAC CATHETERIZATION N/A 02/26/2015   Procedure: Left Heart Cath and Coronary Angiography;  Surgeon: Wellington Hampshire, MD;  Location: [place] CV LAB;  Service: Cardiovascular;  Laterality: N/A;  . CARDIAC CATHETERIZATION N/A 02/26/2015   Procedure: Coronary Stent Intervention;  Surgeon: Wellington Hampshire, MD;  Location: [place] CV LAB;  Service: Cardiovascular;  Laterality: N/A;  . CESAREAN SECTION  1984  . CORONARY ANGIOPLASTY WITH STENT PLACEMENT  2008; ~ 2012   "1; 1"  . CORONARY STENT INTERVENTION N/A 08/04/2016   Procedure: Coronary Stent Intervention;  Surgeon: Wellington Hampshire, MD;  Location: [place] CV [facility];  Service: Cardiovascular;  Laterality: N/A;  . DILATION AND CURETTAGE OF UTERUS    . EXTRACORPOREAL SHOCK WAVE LITHOTRIPSY  X 2  . KNEE ARTHROSCOPY Left   . LEFT HEART CATH Bilateral 08/04/2016   Procedure: Left Heart Cath poss PCI;  Surgeon: Wellington Hampshire, MD;  Location: [place] [facility];  Service: Cardiovascular;  Laterality: Bilateral;  . LEFT HEART CATHETERIZATION WITH CORONARY ANGIOGRAM N/A 06/15/2014   Procedure: LEFT HEART  CATHETERIZATION WITH CORONARY ANGIOGRAM;  Surgeon: Jettie Booze, MD;  Location: [facility] CATH LAB;  Service: Cardiovascular;  Laterality: N/A;  . PERCUTANEOUS CORONARY STENT INTERVENTION (PCI-S)  06/15/2014   Procedure: PERCUTANEOUS CORONARY STENT INTERVENTION (PCI-S);  Surgeon: Jettie Booze, MD;  Location: [facility] CATH LAB;  Service: Cardiovascular;;  . TONSILLECTOMY    . TUBAL LIGATION      Prior to Admission medications   Medication Sig Start Date End Date Taking? Authorizing Provider  amLODipine (NORVASC) 2.5 MG tablet take 1 tablet by mouth once daily 08/27/16   Minna Merritts, MD  aspirin EC 81 MG EC tablet Take 1 tablet (81 mg total) by mouth daily. 02/27/15   Gladstone Lighter, MD  atorvastatin (LIPITOR) 40 MG tablet take 1 tablet by mouth once daily as directed 10/16/16   Minna Merritts, MD  Blood Glucose Monitoring Suppl W/DEVICE KIT Test 1-2 times daily; E11.65 diagnosis 03/02/15   Rubbie Battiest, NP  BUTRANS 10 MCG/HR PTWK patch Place 1 patch onto the skin once a week. 07/03/16   [provider]  clopidogrel (PLAVIX) 75 MG tablet take 1 tablet by mouth once daily WITH BREAKFAST 08/27/16   Gollan, Kathlene November, MD  COMBIVENT RESPIMAT 20-100 MCG/ACT AERS respimat Inhale 1 puff into the lungs every 6 (six) hours as needed for wheezing or shortness of breath. 02/27/15   Gladstone Lighter, MD  cyclobenzaprine (FLEXERIL) 10 MG tablet Take 1 tablet by mouth 3 (three) times daily. 07/17/16   [provider]  furosemide (LASIX) 20 MG tablet take 1 tablet by mouth once daily 09/30/16   Minna Merritts, MD  gabapentin (NEURONTIN) 300 MG capsule Take 3 capsules (900 mg total) by mouth 3 (three) times daily. Patient taking differently: Take 900 mg by mouth 4 (four) times daily.  02/27/15   Gladstone Lighter, MD  insulin glargine (LANTUS) 100 UNIT/ML injection Inject 52 Units into the skin at bedtime.    [provider]  lisinopril (PRINIVIL,ZESTRIL) 40 MG tablet take 1  tablet by mouth once daily 07/18/16   Minna Merritts, MD  metoprolol (TOPROL-XL) 200 MG 24 hr tablet take 1 tablet by mouth once daily with OR IMMEDIETLY FOLLOWING A MEAL 08/11/16   Minna Merritts, MD  Multiple Vitamins-Minerals (MULTIVITAMIN WITH MINERALS) tablet Take 1 tablet by mouth daily.    [provider]  nicotine (NICODERM CQ - DOSED IN MG/24 HOURS) 21 mg/24hr patch Place 1 patch (21 mg total) onto the skin daily. 11/28/15   Dunn, Areta Haber, PA-C  nitroGLYCERIN (NITROSTAT) 0.4 MG SL tablet Place 1 tablet (0.4 mg total) under the tongue every 5 (five) minutes as needed for chest pain (Do not give more than 3 SL tablets in 15 minutes.). 08/05/16   Fritzi Mandes, MD  oxyCODONE (OXY IR/ROXICODONE) 5 MG immediate release tablet Take 1 tablet (  5 mg total) by mouth every 6 (six) hours as needed for moderate pain. Patient taking differently: Take 10 mg by mouth every 4 (four) hours as needed for moderate pain.  02/27/15   Gladstone Lighter, MD  oxyCODONE-acetaminophen (ROXICET) 5-325 MG tablet Take 1 tablet by mouth every 4 (four) hours as needed for severe pain. 11/21/16   Gregor Hams, MD  sulfamethoxazole-trimethoprim (BACTRIM DS,SEPTRA DS) 800-160 MG tablet Take 1 tablet by mouth 2 (two) times daily. 08/12/16   Laban Emperor, PA-C  sulfamethoxazole-trimethoprim (BACTRIM DS,SEPTRA DS) 800-160 MG tablet Take 1 tablet by mouth 2 (two) times daily. 11/21/16 12/01/16  Gregor Hams, MD    Allergies Lithium; Tegretol [carbamazepine]; Levaquin [levofloxacin in d5w]; and Lodine [etodolac]  Family History  Problem Relation Age of Onset  . Family history unknown: Yes    Social History Social History  Substance Use Topics  . Smoking status: Current Every Day Smoker    Packs/day: 1.00    Years: 35.00    Types: Cigarettes  . Smokeless tobacco: Never Used     Comment: Patches  . Alcohol use 0.0 oz/week     Comment: 06/14/2014 "might drink twice/yr"    Review of  Systems Constitutional: No fever/chills Eyes: No visual changes. ENT: No sore throat. Cardiovascular: Denies chest pain. Respiratory: Denies shortness of breath. Gastrointestinal: No abdominal pain.  No nausea, no vomiting.  No diarrhea.  No constipation. Genitourinary: Negative for dysuria. Musculoskeletal: Negative for neck pain.  Negative for back pain. Integumentary: Negative for rash.Positive for left occipital abscess Neurological: Negative for headaches, focal weakness or numbness.   ____________________________________________   PHYSICAL EXAM:  VITAL SIGNS: ED Triage Vitals  Enc Vitals Group     BP 11/20/16 2231 (!) 161/98     Pulse Rate 11/20/16 2231 96     Resp 11/20/16 2231 16     Temp 11/20/16 2231 98.4 F (36.9 C)     Temp Source 11/20/16 2231 Oral     SpO2 11/20/16 2231 98 %     Weight 11/20/16 2231 93.4 kg (206 lb)     Height --      Head Circumference --      Peak Flow --      Pain Score 11/20/16 2319 9     Pain Loc --      Pain Edu? --      Excl. in [place]? --     Constitutional: Alert and oriented. Well appearing and in no acute distress. Eyes: Conjunctivae are normal.  Head: 6 x 6 cm area of erythema and induration with central flocculence noted left occipital scalp. Ears:  Postauricular lymph nodes palpated bilaterally tender to palpation. Mouth/Throat: Mucous membranes are moist. Neck: No stridor.   Cardiovascular: Normal rate, regular rhythm. Good peripheral circulation. Grossly normal heart sounds. Respiratory: Normal respiratory effort.  No retractions. Lungs CTAB. Gastrointestinal: Soft and nontender. No distention.  Musculoskeletal: No lower extremity tenderness nor edema. No gross deformities of extremities. Neurologic:  Normal speech and language. No gross focal neurologic deficits are appreciated.  Skin:  6 x 6 cm area of erythema and induration with central flocculence noted left occipital scalp. Psychiatric: Mood and affect are normal.  Speech and behavior are normal.  ____________________________________________   LABS (all labs ordered are listed, but only abnormal results are displayed)  Labs Reviewed  CBC - Abnormal; Notable for the following:       Result Value   WBC 14.0 (*)    All other components  within normal limits  COMPREHENSIVE METABOLIC PANEL - Abnormal; Notable for the following:    Potassium 3.0 (*)    Chloride 113 (*)    CO2 19 (*)    Glucose, Bld 376 (*)    Calcium 7.7 (*)    Total Protein 5.6 (*)    Albumin 2.9 (*)    AST 9 (*)    ALT 13 (*)    All other components within normal limits  URINALYSIS, COMPLETE (UACMP) WITH MICROSCOPIC - Abnormal; Notable for the following:    Color, Urine STRAW (*)    APPearance CLEAR (*)    Glucose, UA >=500 (*)    Ketones, ur 5 (*)    Squamous Epithelial / LPF 0-5 (*)    All other components within normal limits  GLUCOSE, CAPILLARY - Abnormal; Notable for the following:    Glucose-Capillary 461 (*)    All other components within normal limits  GLUCOSE, CAPILLARY - Abnormal; Notable for the following:    Glucose-Capillary 280 (*)    All other components within normal limits  AEROBIC CULTURE (SUPERFICIAL SPECIMEN)      .Marland KitchenIncision and Drainage Date/Time: 11/21/2016 4:38 AM Performed by: Gregor Hams Authorized by: Gregor Hams   Consent:    Consent obtained:  Verbal   Consent given by:  Patient   Risks discussed:  Bleeding, incomplete drainage and pain   Alternatives discussed:  Alternative treatment Location:    Type:  Abscess   Location:  Head   Head location:  Scalp Pre-procedure details:    Skin preparation:  Chloraprep Anesthesia (see MAR for exact dosages):    Anesthesia method:  Local infiltration   Local anesthetic:  Lidocaine 1% w/o epi Procedure type:    Complexity:  Simple Procedure details:    Incision types:  Cruciate   Scalpel blade:  11   Drainage:  Bloody and purulent   Drainage amount:  Moderate   Packing  materials:  None Post-procedure details:    Patient tolerance of procedure:  Tolerated well, no immediate complications     ____________________________________________   INITIAL IMPRESSION / ASSESSMENT AND PLAN / ED COURSE  Pertinent labs & imaging results that were available during my care of the patient were reviewed by me and considered in my medical decision making (see chart for details).  Patient given Bactrim and Percocet in the emergency department will be prescribed the same for home. Patient advised to follow-up in 2 days      ____________________________________________  FINAL CLINICAL IMPRESSION(S) / ED DIAGNOSES  Final diagnoses:  Scalp abscess     MEDICATIONS GIVEN DURING THIS VISIT:  Medications  lidocaine (PF) (XYLOCAINE) 1 % injection (not administered)  sulfamethoxazole-trimethoprim (BACTRIM DS,SEPTRA DS) 800-160 MG per tablet 1 tablet (1 tablet Oral Given 11/20/16 2355)  oxyCODONE-acetaminophen (PERCOCET/ROXICET) 5-325 MG per tablet 1 tablet (1 tablet Oral Given 11/20/16 2355)  sodium chloride 0.9 % bolus 1,000 mL (0 mLs Intravenous Stopped 11/21/16 0139)  sodium chloride 0.9 % bolus 1,000 mL (0 mLs Intravenous Stopped 11/21/16 0139)  insulin aspart (novoLOG) injection 6 Units (6 Units Intravenous Given 11/20/16 2357)  oxyCODONE-acetaminophen (PERCOCET/ROXICET) 5-325 MG per tablet 1 tablet (1 tablet Oral Given 11/21/16 0137)     NEW OUTPATIENT MEDICATIONS STARTED DURING THIS VISIT:  Discharge Medication List as of 11/21/2016  1:09 AM    START taking these medications   Details  oxyCODONE-acetaminophen (ROXICET) 5-325 MG tablet Take 1 tablet by mouth every 4 (four) hours as needed for severe pain.,  Starting Fri 11/21/2016, Print    !! sulfamethoxazole-trimethoprim (BACTRIM DS,SEPTRA DS) 800-160 MG tablet Take 1 tablet by mouth 2 (two) times daily., Starting Fri 11/21/2016, Until Mon 12/01/2016, Print     !! - Potential duplicate medications found. Please discuss  with provider.      Discharge Medication List as of 11/21/2016  1:09 AM      Discharge Medication List as of 11/21/2016  1:09 AM       Note:  This document was prepared using Dragon voice recognition software and may include unintentional dictation errors.    Gregor Hams, MD 11/21/16 [id number]

## 2016-11-20 NOTE — ED Notes (Signed)
ED Provider at bedside. 

## 2016-11-21 DIAGNOSIS — L02811 Cutaneous abscess of head [any part, except face]: Secondary | ICD-10-CM | POA: Diagnosis not present

## 2016-11-21 LAB — CBC
HCT: 39.2 % (ref 35.0–47.0)
HEMOGLOBIN: 13.1 g/dL (ref 12.0–16.0)
MCH: 30.4 pg (ref 26.0–34.0)
MCHC: 33.4 g/dL (ref 32.0–36.0)
MCV: 91.1 fL (ref 80.0–100.0)
Platelets: 235 10*3/uL (ref 150–440)
RBC: 4.3 MIL/uL (ref 3.80–5.20)
RDW: 14.2 % (ref 11.5–14.5)
WBC: 14 10*3/uL — AB (ref 3.6–11.0)

## 2016-11-21 LAB — COMPREHENSIVE METABOLIC PANEL
ALBUMIN: 2.9 g/dL — AB (ref 3.5–5.0)
ALK PHOS: 77 U/L (ref 38–126)
ALT: 13 U/L — ABNORMAL LOW (ref 14–54)
ANION GAP: 6 (ref 5–15)
AST: 9 U/L — ABNORMAL LOW (ref 15–41)
BUN: 17 mg/dL (ref 6–20)
CO2: 19 mmol/L — AB (ref 22–32)
Calcium: 7.7 mg/dL — ABNORMAL LOW (ref 8.9–10.3)
Chloride: 113 mmol/L — ABNORMAL HIGH (ref 101–111)
Creatinine, Ser: 0.48 mg/dL (ref 0.44–1.00)
GFR calc Af Amer: >60 mL/min (ref >60)
GFR calc non Af Amer: >60 mL/min (ref >60)
GLUCOSE: 376 mg/dL — AB (ref 65–99)
POTASSIUM: 3 mmol/L — AB (ref 3.5–5.1)
SODIUM: 138 mmol/L (ref 135–145)
Total Bilirubin: 0.4 mg/dL (ref 0.3–1.2)
Total Protein: 5.6 g/dL — ABNORMAL LOW (ref 6.5–8.1)

## 2016-11-21 LAB — URINALYSIS, COMPLETE (UACMP) WITH MICROSCOPIC
BACTERIA UA: NONE SEEN
BILIRUBIN URINE: NEGATIVE
Glucose, UA: >=500 mg/dL — AB
Hgb urine dipstick: NEGATIVE
Ketones, ur: 5 mg/dL — AB
LEUKOCYTES UA: NEGATIVE
Nitrite: NEGATIVE
PROTEIN: NEGATIVE mg/dL
RBC / HPF: NONE SEEN RBC/hpf (ref 0–5)
Specific Gravity, Urine: 1.03 (ref 1.005–1.030)
pH: 6 (ref 5.0–8.0)

## 2016-11-21 LAB — GLUCOSE, CAPILLARY: Glucose-Capillary: 280 mg/dL — ABNORMAL HIGH (ref 65–99)

## 2016-11-21 MED ORDER — OXYCODONE-ACETAMINOPHEN 5-325 MG PO TABS
1.0000 | ORAL_TABLET | Freq: Once | ORAL | Status: AC
  Administered 2016-11-21: 1 via ORAL
  Filled 2016-11-21: qty 1

## 2016-11-21 MED ORDER — LIDOCAINE HCL (PF) 1 % IJ SOLN
INTRAMUSCULAR | Status: AC
  Filled 2016-11-21: qty 5

## 2016-11-21 MED ORDER — SULFAMETHOXAZOLE-TRIMETHOPRIM 800-160 MG PO TABS: 1.0000 | ORAL_TABLET | Freq: Two times a day (BID) | ORAL | 0 refills | Status: AC

## 2016-11-21 MED ORDER — OXYCODONE-ACETAMINOPHEN 5-325 MG PO TABS: 1.0000 | ORAL_TABLET | ORAL | 0 refills | Status: DC | PRN

## 2016-11-23 LAB — AEROBIC CULTURE W GRAM STAIN (SUPERFICIAL SPECIMEN)

## 2016-11-23 LAB — AEROBIC CULTURE  (SUPERFICIAL SPECIMEN)

## 2016-11-24 ENCOUNTER — Telehealth: Payer: Self-pay | Admitting: Emergency Medicine

## 2016-11-24 NOTE — Telephone Encounter (Signed)
Patient called asking for results of mrsa test.  I explained wound culture--staph aureus.  Susceptible to bactrim which pt is on now.  Pt says she was concerned because she is feeling sicker, weak, fever, nausea and vomiting. Says the wound is expanding down her back now. She has not been to her pcp for ED follow up and had not planned on it.  Says she has not seen her doctor in a very long time.   I told her she could try her pcp and ask to be seen or return here, but that she should definitely be seen somewhere

## 2017-01-14 ENCOUNTER — Other Ambulatory Visit: Payer: Self-pay | Admitting: Cardiovascular Disease

## 2017-02-09 ENCOUNTER — Emergency Department: Payer: Medicare Other

## 2017-02-09 ENCOUNTER — Emergency Department
Admission: EM | Admit: 2017-02-09 | Discharge: 2017-02-10 | Disposition: A | Discharge status: Home / Self Care | Payer: Medicare Other | Attending: Pediatrics | Admitting: Pediatrics

## 2017-02-09 ENCOUNTER — Encounter: Payer: Self-pay | Admitting: Emergency Medicine

## 2017-02-09 DIAGNOSIS — I252 Old myocardial infarction: Secondary | ICD-10-CM | POA: Diagnosis not present

## 2017-02-09 DIAGNOSIS — E119 Type 2 diabetes mellitus without complications: Secondary | ICD-10-CM | POA: Insufficient documentation

## 2017-02-09 DIAGNOSIS — Z8673 Personal history of transient ischemic attack (TIA), and cerebral infarction without residual deficits: Secondary | ICD-10-CM | POA: Diagnosis not present

## 2017-02-09 DIAGNOSIS — Z79899 Other long term (current) drug therapy: Secondary | ICD-10-CM | POA: Insufficient documentation

## 2017-02-09 DIAGNOSIS — I10 Essential (primary) hypertension: Secondary | ICD-10-CM | POA: Diagnosis not present

## 2017-02-09 DIAGNOSIS — E785 Hyperlipidemia, unspecified: Secondary | ICD-10-CM | POA: Diagnosis not present

## 2017-02-09 DIAGNOSIS — R011 Cardiac murmur, unspecified: Secondary | ICD-10-CM | POA: Diagnosis not present

## 2017-02-09 DIAGNOSIS — I2510 Atherosclerotic heart disease of native coronary artery without angina pectoris: Secondary | ICD-10-CM | POA: Insufficient documentation

## 2017-02-09 DIAGNOSIS — R079 Chest pain, unspecified: Secondary | ICD-10-CM | POA: Diagnosis not present

## 2017-02-09 DIAGNOSIS — R0981 Nasal congestion: Secondary | ICD-10-CM | POA: Diagnosis not present

## 2017-02-09 DIAGNOSIS — F17210 Nicotine dependence, cigarettes, uncomplicated: Secondary | ICD-10-CM | POA: Insufficient documentation

## 2017-02-09 DIAGNOSIS — Z794 Long term (current) use of insulin: Secondary | ICD-10-CM | POA: Diagnosis not present

## 2017-02-09 DIAGNOSIS — R05 Cough: Secondary | ICD-10-CM | POA: Diagnosis not present

## 2017-02-09 DIAGNOSIS — J209 Acute bronchitis, unspecified: Secondary | ICD-10-CM

## 2017-02-09 LAB — BASIC METABOLIC PANEL
Anion gap: 10 (ref 5–15)
BUN: 28 mg/dL — ABNORMAL HIGH (ref 6–20)
CALCIUM: 10 mg/dL (ref 8.9–10.3)
CO2: 25 mmol/L (ref 22–32)
CREATININE: 0.84 mg/dL (ref 0.44–1.00)
Chloride: 100 mmol/L — ABNORMAL LOW (ref 101–111)
Glucose, Bld: 497 mg/dL — ABNORMAL HIGH (ref 65–99)
Potassium: 4.8 mmol/L (ref 3.5–5.1)
Sodium: 135 mmol/L (ref 135–145)

## 2017-02-09 LAB — CBC
HCT: 43.4 % (ref 35.0–47.0)
Hemoglobin: 14.5 g/dL (ref 12.0–16.0)
MCH: 30.7 pg (ref 26.0–34.0)
MCHC: 33.3 g/dL (ref 32.0–36.0)
MCV: 92.2 fL (ref 80.0–100.0)
PLATELETS: 267 10*3/uL (ref 150–440)
RBC: 4.7 MIL/uL (ref 3.80–5.20)
RDW: 14.7 % — ABNORMAL HIGH (ref 11.5–14.5)
WBC: 9.8 10*3/uL (ref 3.6–11.0)

## 2017-02-09 LAB — TROPONIN I

## 2017-02-09 MED ORDER — IPRATROPIUM-ALBUTEROL 0.5-2.5 (3) MG/3ML IN SOLN
3.0000 mL | Freq: Once | RESPIRATORY_TRACT | Status: AC
  Administered 2017-02-09: 3 mL via RESPIRATORY_TRACT
  Filled 2017-02-09: qty 3

## 2017-02-09 NOTE — ED Notes (Signed)
ED Provider at bedside. 

## 2017-02-09 NOTE — ED Provider Notes (Signed)
[facility] Emergency Department Provider Note  ____________________________________________   I have reviewed the triage vital signs and the nursing notes.   HISTORY  Chief Complaint Chest Pain   History limited by: Not Limited   HPI Jennifer [name] is a 56 y.o. female who presents to the emergency department today because of chest pain.   LOCATION:central chest DURATION:one week TIMING: intermittent SEVERITY: moderate QUALITY: tight CONTEXT: patient with history of MI. States for the past couple of weeks she has been having congestion and cough. Started having intermittent episodes of chest tightness. Today had roughly 4 of these episodes.  MODIFYING FACTORS: worse with cough ASSOCIATED SYMPTOMS: cough, congestion. Has felt hot but has not had any measured fevers.  Per medical record review patient has a history of CAD, bronchitis.  Past Medical History:  Diagnosis Date  . Anxiety   . Arthritis    "qwhere" (06/14/2014)  . Bipolar disorder ([place])   . CAD (coronary artery disease)    a. cardiac cath: 2010: 95% mRCA s/p PCI/DES, 60% RPDA; b. cath 05/2014, widely patent LM, mild to mod dz in LAD and LCx, 90% dRCA s/p PCI/DES; c. cath 02/2015: severe ISR proximal segement of distal RCA stent s/p PCI/DES, nl EF by nuc 11/16, mildly elevated LVEDP   . Chronic bronchitis ([place])    "get it q yr"  . Daily headache    "when my blood pressure is up" (06/14/2014)  . Depression   . GERD (gastroesophageal reflux disease)   . Heart murmur   . High cholesterol   . History of stomach ulcers   . Hypertension   . Insomnia   . Kidney stones   . Kidney stones   . Migraine    "haven't had them in a good while; did get them 1-2 times/yr" (06/14/2014)  . Myocardial infarction ([place]) 2008; ~ 2012  . Pneumonia    "get it 1-2 times/year" (06/14/2014)  . RLS (restless legs syndrome)   . Seizures ([address])   . Sleep apnea    "they want me to do the lab but I haven't"  (06/14/2014)  . Stroke [facility])    "they say I've had several" (06/14/2014)  . Type II diabetes mellitus ([place])   . Urinary, incontinence, stress female     Patient Active Problem List   Diagnosis Date Noted  . Unstable angina ([place]) 08/03/2016  . Morbid obesity ([place]) 05/02/2016  . Chest pain with moderate risk for cardiac etiology 03/14/2015  . CAD (coronary artery disease)   . Hospital discharge follow-up 03/07/2015  . Poorly controlled type 2 diabetes mellitus with complication ([place]) 16/55/3748  . Hyperlipidemia 06/22/2014  . Stented coronary artery   . Chronic pain syndrome   . Chest pain at rest 06/14/2014  . Acute bronchitis 06/14/2014  . Tobacco abuse 06/14/2014  . Essential hypertension 06/14/2014  . GERD (gastroesophageal reflux disease) 06/14/2014  . Bipolar disorder ([place]) 06/14/2014  . Chest pain 06/14/2014    Past Surgical History:  Procedure Laterality Date  . ABDOMINAL HYSTERECTOMY  1984   "I've got 1 ovary left"  . CARDIAC CATHETERIZATION N/A 02/26/2015   Procedure: Left Heart Cath and Coronary Angiography;  Surgeon: Wellington Hampshire, MD;  Location: [facility];  Service: Cardiovascular;  Laterality: N/A;  . CARDIAC CATHETERIZATION N/A 02/26/2015   Procedure: Coronary Stent Intervention;  Surgeon: Wellington Hampshire, MD;  Location: [facility];  Service: Cardiovascular;  Laterality: N/A;  . CESAREAN SECTION  1984  .  CORONARY ANGIOPLASTY WITH STENT PLACEMENT  2008; ~ 2012   "1; 1"  . CORONARY STENT INTERVENTION N/A 08/04/2016   Procedure: Coronary Stent Intervention;  Surgeon: Iran Ouch, MD;  Location: [facility] INVASIVE CV LAB;  Service: Cardiovascular;  Laterality: N/A;  . DILATION AND CURETTAGE OF UTERUS    . EXTRACORPOREAL SHOCK WAVE LITHOTRIPSY  X 2  . KNEE ARTHROSCOPY Left   . LEFT HEART CATH Bilateral 08/04/2016   Procedure: Left Heart Cath poss PCI;  Surgeon: Iran Ouch, MD;  Location: [facility] INVASIVE CV LAB;  Service: Cardiovascular;   Laterality: Bilateral;  . LEFT HEART CATHETERIZATION WITH CORONARY ANGIOGRAM N/A 06/15/2014   Procedure: LEFT HEART CATHETERIZATION WITH CORONARY ANGIOGRAM;  Surgeon: Corky Crafts, MD;  Location: [facility] CATH LAB;  Service: Cardiovascular;  Laterality: N/A;  . PERCUTANEOUS CORONARY STENT INTERVENTION (PCI-S)  06/15/2014   Procedure: PERCUTANEOUS CORONARY STENT INTERVENTION (PCI-S);  Surgeon: Corky Crafts, MD;  Location: [facility] CATH LAB;  Service: Cardiovascular;;  . TONSILLECTOMY    . TUBAL LIGATION      Prior to Admission medications   Medication Sig Start Date End Date Taking? Authorizing Provider  amLODipine (NORVASC) 2.5 MG tablet take 1 tablet by mouth once daily 08/27/16   Antonieta Iba, MD  aspirin EC 81 MG EC tablet Take 1 tablet (81 mg total) by mouth daily. 02/27/15   Enid Baas, MD  atorvastatin (LIPITOR) 40 MG tablet take 1 tablet by mouth once daily as directed 01/14/17   Antonieta Iba, MD  Blood Glucose Monitoring Suppl W/DEVICE KIT Test 1-2 times daily; E11.65 diagnosis 03/02/15   Carollee Leitz, RN  BUTRANS 10 MCG/HR PTWK patch Place 1 patch onto the skin once a week. 07/03/16   [provider]  clopidogrel (PLAVIX) 75 MG tablet take 1 tablet by mouth once daily WITH BREAKFAST 08/27/16   Gollan, Tollie Pizza, MD  COMBIVENT RESPIMAT 20-100 MCG/ACT AERS respimat Inhale 1 puff into the lungs every 6 (six) hours as needed for wheezing or shortness of breath. 02/27/15   Enid Baas, MD  cyclobenzaprine (FLEXERIL) 10 MG tablet Take 1 tablet by mouth 3 (three) times daily. 07/17/16   [provider]  furosemide (LASIX) 20 MG tablet take 1 tablet by mouth once daily 09/30/16   Antonieta Iba, MD  gabapentin (NEURONTIN) 300 MG capsule Take 3 capsules (900 mg total) by mouth 3 (three) times daily. Patient taking differently: Take 900 mg by mouth 4 (four) times daily.  02/27/15   Enid Baas, MD  insulin glargine (LANTUS) 100 UNIT/ML injection Inject  52 Units into the skin at bedtime.    [provider]  lisinopril (PRINIVIL,ZESTRIL) 40 MG tablet take 1 tablet by mouth once daily 07/18/16   Antonieta Iba, MD  metoprolol (TOPROL-XL) 200 MG 24 hr tablet take 1 tablet by mouth once daily with OR IMMEDIETLY FOLLOWING A MEAL 08/11/16   Antonieta Iba, MD  Multiple Vitamins-Minerals (MULTIVITAMIN WITH MINERALS) tablet Take 1 tablet by mouth daily.    [provider]  nicotine (NICODERM CQ - DOSED IN MG/24 HOURS) 21 mg/24hr patch Place 1 patch (21 mg total) onto the skin daily. 11/28/15   Dunn, Raymon Mutton, PA-C  nitroGLYCERIN (NITROSTAT) 0.4 MG SL tablet Place 1 tablet (0.4 mg total) under the tongue every 5 (five) minutes as needed for chest pain (Do not give more than 3 SL tablets in 15 minutes.). 08/05/16   Enedina Finner, MD  oxyCODONE (OXY IR/ROXICODONE) 5 MG  immediate release tablet Take 1 tablet (5 mg total) by mouth every 6 (six) hours as needed for moderate pain. Patient taking differently: Take 10 mg by mouth every 4 (four) hours as needed for moderate pain.  02/27/15   Gladstone Lighter, MD  oxyCODONE-acetaminophen (ROXICET) 5-325 MG tablet Take 1 tablet by mouth every 4 (four) hours as needed for severe pain. 11/21/16   Gregor Hams, MD  sulfamethoxazole-trimethoprim (BACTRIM DS,SEPTRA DS) 800-160 MG tablet Take 1 tablet by mouth 2 (two) times daily. 08/12/16   Laban Emperor, PA-C    Allergies Lithium; Tegretol [carbamazepine]; Levaquin [levofloxacin in d5w]; and Lodine [etodolac]  Family History  Problem Relation Age of Onset  . Family history unknown: Yes    Social History Social History  Substance Use Topics  . Smoking status: Current Every Day Smoker    Packs/day: 1.00    Years: 35.00    Types: Cigarettes  . Smokeless tobacco: Never Used     Comment: Patches  . Alcohol use 0.0 oz/week     Comment: 06/14/2014 "might drink twice/yr"    Review of Systems Constitutional: No fever/chills Eyes: No visual  changes. ENT: No sore throat. Cardiovascular: Positive for chest tightness. Respiratory: Positive for congestion and cough. Gastrointestinal: No abdominal pain.  No nausea, no vomiting.  No diarrhea.   Genitourinary: Negative for dysuria. Musculoskeletal: Negative for back pain. Skin: Negative for rash. Neurological: Negative for headaches, focal weakness or numbness.  ____________________________________________   PHYSICAL EXAM:  VITAL SIGNS: ED Triage Vitals  Enc Vitals Group     BP 02/09/17 2107 131/88     Pulse Rate 02/09/17 2107 81     Resp 02/09/17 2107 17     Temp 02/09/17 2107 98.2 F (36.8 C)     Temp Source 02/09/17 2107 Oral     SpO2 02/09/17 2107 97 %     Weight 02/09/17 2106 206 lb (93.4 kg)     Height --    Constitutional: Alert and oriented. Well appearing and in no distress. Eyes: Conjunctivae are normal.  ENT   Head: Normocephalic and atraumatic.   Nose: No congestion/rhinnorhea.   Mouth/Throat: Mucous membranes are moist.   Neck: No stridor. Hematological/Lymphatic/Immunilogical: No cervical lymphadenopathy. Cardiovascular: Normal rate, regular rhythm.  No murmurs, rubs, or gallops.  Respiratory: Normal respiratory effort without tachypnea nor retractions. Breath sounds are clear and equal bilaterally. No wheezes/rales/rhonchi. Gastrointestinal: Soft and non tender. No rebound. No guarding.  Genitourinary: Deferred Musculoskeletal: Normal range of motion in all extremities. No lower extremity edema. Neurologic:  Normal speech and language. No gross focal neurologic deficits are appreciated.  Skin:  Skin is warm, dry and intact. No rash noted. Psychiatric: Mood and affect are normal. Speech and behavior are normal. Patient exhibits appropriate insight and judgment.  ____________________________________________    LABS (pertinent positives/negatives)  Trop <0.03 CBC wnl except for RDW BMP glu 497, anion gap  10  ____________________________________________   EKG  I, Nance Pear, attending physician, personally viewed and interpreted this EKG  EKG Time: 2102 Rate: 79 Rhythm: normal sinus rhythm Axis: normal Intervals: qtc 410 QRS: narrow, q waves III, V1 ST changes: no st elevation Impression: abnormal ekg   ____________________________________________    RADIOLOGY  CXR Changes consistent with bronchitis   ____________________________________________   PROCEDURES  Procedures  ____________________________________________   INITIAL IMPRESSION / ASSESSMENT AND PLAN / ED COURSE  Pertinent labs & imaging results that were available during my care of the patient were reviewed by me and considered  in my medical decision making (see chart for details).  patient presented to the emergency department today because of concerns for chest tightness in the setting of cost and congestion. Differential for chest tightness would certainly include pneumonia, bronchitis, ACS, pneumothorax amongst other etiologies. Initial troponin was negative. I do think likely this is secondary to bronchitis. Patient was given a DuoNeb treatment and did feel better afterwards. We will however check a second troponin given history of MI. If it is negative I would anticipate discharge. Discussed plan with patient.   ____________________________________________   FINAL CLINICAL IMPRESSION(S) / ED DIAGNOSES  Chest pain Cough Congestion  Note: This dictation was prepared with Dragon dictation. Any transcriptional errors that result from this process are unintentional     Nance Pear, MD 02/10/17 [phone]

## 2017-02-09 NOTE — ED Triage Notes (Signed)
Pt reports she has had intermittent central chest pain x1 week. Pt has HX of multiple MI's and wanted to come into ED for evaluation. Pt denies N/V but reports HA. Pt denies SOB.

## 2017-02-09 NOTE — ED Notes (Addendum)
Pt to the er for fever, cough, chest pain, cough is profuctive. Pain is tightening up into her jaw. Pt has pressure in her ears. Pt reports pain in her back with coughing. Pt has been using her grandsons inhaler and it has improved her symptoms. Pt currently smokes. Pt states her sister had a chest infection and her grandson has a sore throat. Pt reports coughing and wheezing as well.

## 2017-02-10 DIAGNOSIS — R079 Chest pain, unspecified: Secondary | ICD-10-CM | POA: Diagnosis not present

## 2017-02-10 LAB — TROPONIN I: Troponin I: <0.03 ng/mL (ref <0.03)

## 2017-02-10 MED ORDER — AZITHROMYCIN 500 MG PO TABS: 500.0000 mg | ORAL_TABLET | Freq: Every day | ORAL | 0 refills | Status: AC

## 2017-02-10 MED ORDER — BENZONATATE 100 MG PO CAPS: 100.0000 mg | ORAL_CAPSULE | Freq: Three times a day (TID) | ORAL | 0 refills | Status: DC | PRN

## 2017-02-10 NOTE — ED Notes (Signed)
In to draw blood. Pt is sleeping soundly.

## 2017-02-11 ENCOUNTER — Telehealth: Payer: Self-pay | Admitting: Cardiovascular Disease

## 2017-02-11 NOTE — Telephone Encounter (Signed)
[facility] for recent [facility] ed fu and sx of Angina   Per Dr. Julio AlmSlatkoff patient ekg was normal enzymes neg but bc of her sx she may need a cath or a stress test    Offered next available 11/20 but this was declined please call to advise or discuss   Alternate contact  [facility] resident pager [phone]

## 2017-02-12 NOTE — Telephone Encounter (Signed)
Spoke with resident physician and she wants patient to be seen sooner than appointment offered. She reviewed that patient is having symptoms consistent with angina and may need work up for possible cath. Scheduled her to come in on 02/18/17 at 2:30 pm with Eula Listenyan Dunn PA. She was agreeable with this appointment and will inform patient. She was appreciative for the call and had no further questions at this time.

## 2017-02-17 ENCOUNTER — Encounter: Payer: Self-pay | Admitting: Physician Assistant

## 2017-02-18 ENCOUNTER — Encounter: Payer: Self-pay | Admitting: Physician Assistant

## 2017-02-18 ENCOUNTER — Ambulatory Visit (INDEPENDENT_AMBULATORY_CARE_PROVIDER_SITE_OTHER): Payer: Medicare Other | Admitting: Physician Assistant

## 2017-02-18 ENCOUNTER — Other Ambulatory Visit
Admission: RE | Admit: 2017-02-18 | Discharge: 2017-02-18 | Disposition: A | Discharge status: Home / Self Care | Payer: Medicare Other | Source: Ambulatory Visit | Attending: Physician Assistant | Admitting: Physician Assistant

## 2017-02-18 VITALS — BP 122/88 | HR 93 | Ht 62.0 in | Wt 205.0 lb

## 2017-02-18 DIAGNOSIS — E782 Mixed hyperlipidemia: Secondary | ICD-10-CM | POA: Diagnosis not present

## 2017-02-18 DIAGNOSIS — I25110 Atherosclerotic heart disease of native coronary artery with unstable angina pectoris: Secondary | ICD-10-CM | POA: Diagnosis not present

## 2017-02-18 DIAGNOSIS — I5032 Chronic diastolic (congestive) heart failure: Secondary | ICD-10-CM | POA: Diagnosis not present

## 2017-02-18 DIAGNOSIS — I10 Essential (primary) hypertension: Secondary | ICD-10-CM | POA: Diagnosis not present

## 2017-02-18 DIAGNOSIS — I200 Unstable angina: Secondary | ICD-10-CM | POA: Diagnosis present

## 2017-02-18 DIAGNOSIS — E118 Type 2 diabetes mellitus with unspecified complications: Secondary | ICD-10-CM

## 2017-02-18 DIAGNOSIS — Z01810 Encounter for preprocedural cardiovascular examination: Secondary | ICD-10-CM

## 2017-02-18 DIAGNOSIS — E1165 Type 2 diabetes mellitus with hyperglycemia: Secondary | ICD-10-CM | POA: Diagnosis not present

## 2017-02-18 DIAGNOSIS — Z720 Tobacco use: Secondary | ICD-10-CM

## 2017-02-18 DIAGNOSIS — I739 Peripheral vascular disease, unspecified: Secondary | ICD-10-CM

## 2017-02-18 DIAGNOSIS — R0609 Other forms of dyspnea: Secondary | ICD-10-CM | POA: Diagnosis not present

## 2017-02-18 LAB — BASIC METABOLIC PANEL
ANION GAP: 10 (ref 5–15)
BUN: 21 mg/dL — AB (ref 6–20)
CHLORIDE: 103 mmol/L (ref 101–111)
CO2: 24 mmol/L (ref 22–32)
Calcium: 9.4 mg/dL (ref 8.9–10.3)
Creatinine, Ser: 0.7 mg/dL (ref 0.44–1.00)
GFR calc Af Amer: >60 mL/min (ref >60)
Glucose, Bld: 328 mg/dL — ABNORMAL HIGH (ref 65–99)
POTASSIUM: 4.1 mmol/L (ref 3.5–5.1)
SODIUM: 137 mmol/L (ref 135–145)

## 2017-02-18 LAB — CBC WITH DIFFERENTIAL/PLATELET
BASOS ABS: 0.1 10*3/uL (ref 0–0.1)
BASOS PCT: 1 %
EOS ABS: 0.3 10*3/uL (ref 0–0.7)
Eosinophils Relative: 3 %
HEMATOCRIT: 43.5 % (ref 35.0–47.0)
HEMOGLOBIN: 14.3 g/dL (ref 12.0–16.0)
Lymphocytes Relative: 29 %
Lymphs Abs: 3 10*3/uL (ref 1.0–3.6)
MCH: 30.4 pg (ref 26.0–34.0)
MCHC: 32.8 g/dL (ref 32.0–36.0)
MCV: 92.6 fL (ref 80.0–100.0)
Monocytes Absolute: 0.4 10*3/uL (ref 0.2–0.9)
Monocytes Relative: 4 %
NEUTROS ABS: 6.4 10*3/uL (ref 1.4–6.5)
NEUTROS PCT: 63 %
Platelets: 267 10*3/uL (ref 150–440)
RBC: 4.69 MIL/uL (ref 3.80–5.20)
RDW: 14.7 % — ABNORMAL HIGH (ref 11.5–14.5)
WBC: 10.2 10*3/uL (ref 3.6–11.0)

## 2017-02-18 LAB — PROTIME-INR
INR: 0.88
PROTHROMBIN TIME: 11.9 s (ref 11.4–15.2)

## 2017-02-18 MED ORDER — RANOLAZINE ER 500 MG PO TB12: 500.0000 mg | ORAL_TABLET | Freq: Two times a day (BID) | ORAL | 3 refills | Status: DC

## 2017-02-18 NOTE — Progress Notes (Signed)
Cardiology Office Note Date:  02/18/2017  Patient ID:  Jennifer [name], Jennifer [name] November 07, 1960, MRN [id number] PCP:  Jeronimo Greaves, MD  Cardiologist:  Dr. Rockey Situ, MD    Chief Complaint: Chest pain  History of Present Illness: Jennifer [name] is a 56 y.o. female with history of CAD s/p multiple prior stents described below most recently in 09/2834, diastolic dysfunction, DM2, HTN, HLD, sleep apnea not on CPAP, ongoing tobacco abuse - smoking 1/4 pack of cigarettes daily, anxiety, and depression who presents for evaluation of chest pain.   Patient previously underwent stenting to the RCA in 2008 followed by recurrence of chest pain in 2010, leading to an abnormal stress test that showed prior inferior infarct with peri-infarct ischemia. Cardiac cath in 2010, showed 95% stenosis in the mid RCA, diffuse 60% stenosis in the proximal RDPA. She underwent successful PCI/DES of the mid RCA without complications. She redeevloped chest pain in 05/2014 and was transferred from [facility] to [facility]. Troponin was negative. Cardiac cath 06/15/2014, showed widely patent but small left main, mild disease in the LAD and its branches, mild to moderate disease in the LCx and its branches, severe 90% stenosis in the distal RCA s/p PCI/DES 2.5 x 20 mm Synergy drug eluting stent, normal LVSF, LVEDP 28 mm Hg. She was admitted again to the hospital on 02/23/15 with recurrence of chest pain with associated palpitations. Troponin was negative. ECG showed no acute changes. She underwent nuclear stress test on 02/23/15 that showed medium defect of moderate severity present in the mid inferoseptal, mid inferior and apical inferior location. This was consistent with ischemia. EF 54%. This was in intermediate risk study. She underwent cardiac cath on 02/26/15 that showed severe one-vessel CAD with severe ISR in the proximal segment of the previously placed stents in the distal RCA. She underwent successful PCI/DES to the distal RCA for ISR. Mildly elevated  LVEDP. Normal EF by nuclear stress testing. She was seen in November 2016 for post-cath follow up and noted nonexertional chest pain lasting 5-10 seconds in duration. She had no exertional symptoms and could go all day running around without any symptoms. She underwent treadmill Myoview on 03/19/15 that was low risk with an EF of 55-65%. She was admitted to [facility] 07/2016 with unstable angina/NSTEMI with peak troponin of 0.12. Echo 07/2016 showed EF 60-65%, no RWMA, Gr1DD, mild AI, left atrium normal in size, RV systolic function normal, PASP normal. LHC 07/2016 showed severe one-vessel CAD affecting the RCA with multiple previous stenting. There was felt to be a plaque rupture within the proximal to mid stent which was the culprit for her unstable angina. She underwent successful PCI/DES placement to the proximal/mid RCA within the previously placed stent. There was 60% disease in the mid to distal segment which was left to be treated medically. Remains cath details below. Indefinate DAPT was advised.   She was seen in the ED 10/22 with intermittent chest pain x 2 weeks with associated congestion. Cardiac enzymes negative x 2. Vitals stable. EKG showed sinus rhythm, 79 bpm, nonspecific anterior st/t changes. CXR showed changes c/w bronchitis. Symptoms improved with nebulizer therapy. She was discharged with azithromycin and Tessalon. Patient followed up with PCP on 10/24 with continued symptoms of chest pain, cough, and wheeze. She was given Flovent and albuterol for possible AECOPD (no formally diagnosed COPD) with planned follow up PFTs at a later date. It was also recommended the patient see cardiology given her cardiac history.  Patient comes in today noting  a 2-3 week history of stuttering chest pain that has been getting worse over the past several days. Pain is described as a "deep pressure" that feels like her pain prior to her above PCIs. Pain is associated with both exertion and non-exertion. It is located  along the left side of the chest and radiates to the left jaw and down the left arm. It is associated with nausea, diaphoresis, and SOB. Over the past 1.5 weeks she has taken a total of 10 SL NTG with improvement in her pain (once having had to take 2 within 10 minutes). These are the first SL NTG she has ever had to take. She now keeps SL NTG with her at all times. She feels like her bronchitis has resolved, though her chest pain symptoms have persisted. She has not missed any doses of DAPT. She is having these episodes of chest pain multiple times per day now, having most recently had an episode early this morning that did not require SL NTG. She is currently chest pain free. Blood pressure at home has remained well controlled. She continues to struggle with her blood sugars with readings frequently in the 300-400 range. She is under a lot of stress at home lately. Cough has resolved. No orthopnea, lower extremity swelling, abdominal distension, PND, or early satiety. She does not follow a low sodium diet. She also mentions a several month history of bilateral lower extremity pain with ambulation. She indicates her leg ache and feel very weak with just walking around in her house now. Symptoms improve with rest. No prior PAD work up.    Past Medical History:  Diagnosis Date  . Anxiety   . Arthritis    "qwhere" (06/14/2014)  . Bipolar disorder ([place])   . CAD (coronary artery disease)    a. cardiac cath 2008 PCI/DES to RCA; b. [name] 2012: 95% mRCA s/p PCI/DES, 60% RPDA; b. LHC 2/16, widely patent LM, mild to mod dz in LAD and LCx, 90% dRCA s/p PCI/DES; c. cath 11/16: severe ISR prox seg dRCA stent s/p PCI/DES, nl EF by nuc 11/16, mildly ele LVEDP;  d. LHC 4/18: LM nl, LAD nl, D1 40%,  LcX small mild irregs, OM1 60%, p-mRCA 95% s/p PCI/DES, m-dRCA 60%, RPDA 60% filled by L-R collats   . Chronic bronchitis ([place])    "get it q yr"  . Daily headache    "when my blood pressure is up" (06/14/2014)  . Depression     . GERD (gastroesophageal reflux disease)   . Heart murmur   . High cholesterol   . History of stomach ulcers   . Hypertension   . Insomnia   . Kidney stones   . Kidney stones   . Migraine    "haven't had them in a good while; did get them 1-2 times/yr" (06/14/2014)  . Myocardial infarction ([place]) 2008; ~ 2012  . Pneumonia    "get it 1-2 times/year" (06/14/2014)  . RLS (restless legs syndrome)   . Seizures ([place])   . Sleep apnea    "they want me to do the lab but I haven't" (06/14/2014)  . Stroke [facility])    "they say I've had several" (06/14/2014)  . Type II diabetes mellitus ([place])   . Urinary, incontinence, stress female     Past Surgical History:  Procedure Laterality Date  . ABDOMINAL HYSTERECTOMY  1984   "I've got 1 ovary left"  . CARDIAC CATHETERIZATION N/A 02/26/2015   Procedure: Left Heart Cath and Coronary  Angiography;  Surgeon: Wellington Hampshire, MD;  Location: [place] CV LAB;  Service: Cardiovascular;  Laterality: N/A;  . CARDIAC CATHETERIZATION N/A 02/26/2015   Procedure: Coronary Stent Intervention;  Surgeon: Wellington Hampshire, MD;  Location: [place] CV LAB;  Service: Cardiovascular;  Laterality: N/A;  . CESAREAN SECTION  1984  . CORONARY ANGIOPLASTY WITH STENT PLACEMENT  2008; ~ 2012   "1; 1"  . CORONARY STENT INTERVENTION N/A 08/04/2016   Procedure: Coronary Stent Intervention;  Surgeon: Wellington Hampshire, MD;  Location: [place] CV LAB;  Service: Cardiovascular;  Laterality: N/A;  . DILATION AND CURETTAGE OF UTERUS    . EXTRACORPOREAL SHOCK WAVE LITHOTRIPSY  X 2  . KNEE ARTHROSCOPY Left   . LEFT HEART CATH Bilateral 08/04/2016   Procedure: Left Heart Cath poss PCI;  Surgeon: Wellington Hampshire, MD;  Location: [place] CV LAB;  Service: Cardiovascular;  Laterality: Bilateral;  . LEFT HEART CATHETERIZATION WITH CORONARY ANGIOGRAM N/A 06/15/2014   Procedure: LEFT HEART CATHETERIZATION WITH CORONARY ANGIOGRAM;  Surgeon: Jettie Booze, MD;  Location: [facility]  CATH LAB;  Service: Cardiovascular;  Laterality: N/A;  . PERCUTANEOUS CORONARY STENT INTERVENTION (PCI-S)  06/15/2014   Procedure: PERCUTANEOUS CORONARY STENT INTERVENTION (PCI-S);  Surgeon: Jettie Booze, MD;  Location: [facility] CATH LAB;  Service: Cardiovascular;;  . TONSILLECTOMY    . TUBAL LIGATION      Current Meds  Medication Sig  . albuterol (PROVENTIL HFA;VENTOLIN HFA) 108 (90 Base) MCG/ACT inhaler Inhale 1-2 puffs into the lungs every 4 (four) hours as needed.   [name] amLODipine (NORVASC) 2.5 MG tablet take 1 tablet by mouth once daily  . aspirin EC 81 MG EC tablet Take 1 tablet (81 mg total) by mouth daily.  [name] atorvastatin (LIPITOR) 40 MG tablet take 1 tablet by mouth once daily as directed  . benzonatate (TESSALON PERLES) 100 MG capsule Take 1 capsule (100 mg total) by mouth 3 (three) times daily as needed for cough.  . Blood Glucose Monitoring Suppl W/DEVICE KIT Test 1-2 times daily; E11.65 diagnosis  . clopidogrel (PLAVIX) 75 MG tablet take 1 tablet by mouth once daily WITH BREAKFAST  . cyclobenzaprine (FLEXERIL) 10 MG tablet Take 1 tablet by mouth 3 (three) times daily.  . fluticasone (FLOVENT HFA) 110 MCG/ACT inhaler Inhale 2 puffs into the lungs 2 (two) times daily.   . furosemide (LASIX) 20 MG tablet take 1 tablet by mouth once daily  . insulin glargine (LANTUS) 100 UNIT/ML injection Inject 52 Units into the skin at bedtime.  [name] lisinopril (PRINIVIL,ZESTRIL) 40 MG tablet take 1 tablet by mouth once daily  . metoprolol (TOPROL-XL) 200 MG 24 hr tablet take 1 tablet by mouth once daily with OR IMMEDIETLY FOLLOWING A MEAL  . Multiple Vitamins-Minerals (MULTIVITAMIN WITH MINERALS) tablet Take 1 tablet by mouth daily.  . nitroGLYCERIN (NITROSTAT) 0.4 MG SL tablet Place 1 tablet (0.4 mg total) under the tongue every 5 (five) minutes as needed for chest pain (Do not give more than 3 SL tablets in 15 minutes.).    Allergies:   Lithium; Tegretol [carbamazepine]; Levaquin [levofloxacin in  d5w]; and Lodine [etodolac]   Social History:  The patient  reports that she has been smoking Cigarettes.  She has a 8.75 pack-year smoking history. She has never used smokeless tobacco. She reports that she drinks alcohol. She reports that she uses drugs, including Marijuana.   Family History:  The patient's family history includes Cirrhosis in her father; Lung cancer in her  mother.  ROS:   Review of Systems  Constitutional: Positive for diaphoresis and malaise/fatigue. Negative for chills, fever and weight loss.  HENT: Negative for congestion.   Eyes: Negative for discharge and redness.  Respiratory: Positive for shortness of breath. Negative for cough, hemoptysis, sputum production and wheezing.   Cardiovascular: Positive for chest pain and claudication. Negative for palpitations, orthopnea, leg swelling and PND.  Gastrointestinal: Positive for nausea. Negative for abdominal pain, blood in stool, heartburn, melena and vomiting.  Genitourinary: Negative for hematuria.  Musculoskeletal: Negative for falls and myalgias.  Skin: Negative for rash.  Neurological: Positive for weakness. Negative for dizziness, tingling, tremors, sensory change, speech change, focal weakness and loss of consciousness.  Endo/Heme/Allergies: Does not bruise/bleed easily.  Psychiatric/Behavioral: Negative for substance abuse. The patient is not nervous/anxious.   All other systems reviewed and are negative.    PHYSICAL EXAM:  VS:  BP 122/88 (BP Location: Left Arm, Patient Position: Sitting, Cuff Size: Normal)   Pulse 93   Ht '5\' 2"'$  (1.575 m)   Wt 205 lb (93 kg)   BMI 37.49 kg/m  BMI: Body mass index is 37.49 kg/m.  Physical Exam  Constitutional: She is oriented to person, place, and time. She appears well-developed and well-nourished.  Tobacco odor noted.   HENT:  Head: Normocephalic and atraumatic.  Eyes: Right eye exhibits no discharge. Left eye exhibits no discharge.  Neck: Normal range of motion. No  JVD present.  Cardiovascular: Normal rate, regular rhythm, S1 normal, S2 normal and normal heart sounds.  Exam reveals no distant heart sounds, no friction rub, no midsystolic click and no opening snap.   No murmur heard. Pulses:      Posterior tibial pulses are 1+ on the right side, and 1+ on the left side.  Pulmonary/Chest: Effort normal. No respiratory distress. She has no decreased breath sounds. She has wheezes in the right middle field, the right lower field, the left middle field and the left lower field. She has no rhonchi. She has no rales. She exhibits no tenderness.  Abdominal: Soft. She exhibits no distension. There is no tenderness.  Musculoskeletal: She exhibits no edema.  Neurological: She is alert and oriented to person, place, and time.  Skin: Skin is warm and dry. No cyanosis. Nails show no clubbing.  Psychiatric: She has a normal mood and affect. Her speech is normal and behavior is normal. Judgment and thought content normal.     EKG:  Was ordered and interpreted by me today. Shows NSR, 94 bpm, nonspecific st/t changes leads III and V2  Recent Labs: 11/20/2016: ALT 13 02/09/2017: BUN 28; Creatinine, Ser 0.84; Hemoglobin 14.5; Platelets 267; Potassium 4.8; Sodium 135  08/02/2016: Cholesterol 140; HDL 35; LDL Cholesterol 71; Total CHOL/HDL Ratio 4.0; Triglycerides 172; VLDL 34   Estimated Creatinine Clearance: 82.3 mL/min (by C-G formula based on SCr of 0.84 mg/dL).   Wt Readings from Last 3 Encounters:  02/18/17 205 lb (93 kg)  02/09/17 206 lb (93.4 kg)  11/20/16 206 lb (93.4 kg)     Other studies reviewed:  LHC 08/04/2016: Coronary Findings   Dominance: Right  Left Main  Vessel is angiographically normal.  Left Anterior Descending  Vessel is angiographically normal.  First Diagonal Branch  1st Diag lesion, 40% stenosed.  Second Diagonal Branch  Vessel is small in size. Vessel is angiographically normal.  Third Diagonal Branch  Vessel is angiographically  normal.  Left Circumflex  Vessel is small. There is mild the vessel.  First  Obtuse Marginal Branch  Vessel is moderate in size.  1st Mrg lesion, 60% stenosed.  Third Obtuse Marginal Branch  Vessel is angiographically normal.  Right Coronary Artery  Prox RCA to Mid RCA lesion, 95% stenosed. The lesion was previously treated.  Angioplasty: Lesion crossed with guidewire. Pre-stent angioplasty was performed using a BALLOON TREK RX [id number]. A STENT RESOLUTE ONYX 3.5X30 drug eluting stent was successfully placed. Post-stent angioplasty was performed using a BALLOON [place] TREK RX [id number]. Maximum pressure: 16 atm. Inflation time: 15 sec. The pre-interventional distal flow is normal (TIMI 3). The post-interventional distal flow is normal (TIMI 3). The intervention was successful . No complications occurred at this lesion.  There is no residual stenosis post intervention.  Mid RCA to Dist RCA lesion, 60% stenosed.  Dist RCA lesion, 0% stenosed. Dist RCA lesion with no stenosis was previously treated.  Right Posterior Descending Artery  RPDA filled by collaterals from 3rd Sept.  Ost RPDA to RPDA lesion, 60% stenosed.  Right Posterior Atrioventricular Branch  The vessel exhibits minimal luminal irregularities.  First Right Posterolateral  Vessel is small in size.  Second Right Posterolateral  Vessel is angiographically normal.  Third Right Posterolateral  Vessel is angiographically normal.  Wall Motion              Left Heart   Left Ventricle The left ventricular size is normal. The left ventricular systolic function is normal. LV end diastolic pressure is mildly elevated. The left ventricular ejection fraction is 55-65% by visual estimate. No regional wall motion abnormalities.    Coronary Diagrams   Diagnostic Diagram       Post-Intervention Diagram         Additional studies/records reviewed today include: summarized above  ASSESSMENT AND PLAN:  1. CAD in native coronary  artery with unstable angina: She is currently chest pain free. Over the past 2-3 weeks she has noticed stuttering left-sided chest pain that radiates to the left jaw and down the left arm. Pain is a deep pressure sensation that is occurring with minimal exertion and at rest. Pain is responsive to SL NTG, patient has taken 10 tabs over the past 1.5 weeks (these are the first SL NTG tabs she has ever taken). There is associated SOB, diaphoresis, and nausea. Pain feels exactly like her pain leading up to her prior PCIs. She also notes increased belching and reflux, which she typically does not have at baseline. She has not missed any doses of her DAPT. She has previously not tolerated isosorbide due to hypotension. Discussed further evaluation and treatment options in detail. Cardiovascular risk factors include CAD with prior MI/PCI/DES, uncontrolled insulin-dependent diabetes, ongoing tobacco abuse, morbid obesity, HLD, HTN, female s/p menopause. Given her cormorbidities and symptoms we feel like it would be best to proceed directly to [facility] as I am not certain we would be able to fully trust a negative nuclear stress test in her given her body habitus, PMH/risk factors, and symptoms. She has been scheduled for LHC with Dr. Rockey Situ on 02/19/2017. We attempted to supply her with samples of Ranexa; unfortunately we do not have any in the office currently. I do not think she will be able to afford this medication. Continue DAPT with ASA 81 mg daily and Plavix 75 mg daily. Continue Toprol XL 200 mg and amlodipine for antianginal effect. Risks and benefits of cardiac catheterization have been discussed with the patient including risks of bleeding, bruising, infection, kidney damage, stroke, heart attack, and death. The patient  understands these risks and is willing to proceed with the procedure. All questions have been answered and concerns listened to.   2. Chronic diastolic CHF: She does not appear volume overloaded at  this time. Weight stable at 205 pounds when compared to last office visit in 07/2016 at 206 pounds. Check echo as below. Not on standing diuretic, no clinical indication to start one at this time. CHF dietary education provided. Call for weight gain > 2 pounds overnight or > 5 pounds in a week. Consider adding spironolactone at follow up visit once her acute issues are resolved.    3. Claudication: Schedule bilateral lower extremity arterial ultrasound/ABI. If abnormal recommend PV evaluation. Aggressive risk factor modification and Lipitor. ASA as above.   4. HTN: Blood pressure well controlled today. Continue lisinopril 40 mg daily and Toprol XL 200 mg daily. BP has previously dropped significantly with isosorbide limiting our ability to add this as above.   5. HLD: LDL of 71 from 07/2016. Goal LDL < 70. Continue Lipitor 40 mg daily. Recommend fasting lipid and liver function, will be scheduled. If not at goal, add Zetia 10 mg daily.   6. Poorly controlled DM2: HGB A1c > 14 on 02/11/2017. Per PCP.   7. Dyspnea/ongoing tobacco abuse/possible COPD: Faint bilateral expiratory wheezing noted on exam today. Less likely CHF exacerbation given stable weight, no lower extremity edema, abdominal distension, or elevated JVD. Check echo to evaluate for preserved LV systolic function, wall motion, valvular function, and right side pressure. Agree with PCP obtaining PFTs. Cessation of tobacco advised.   8. Morbid obesity: Would benefit from cardiac rehab.   Disposition: F/u with me in 2 weeks.   Current medicines are reviewed at length with the patient today.  The patient did not have any concerns regarding medicines.  Melvern Banker PA-C 02/18/2017 2:49 PM     [address] [place], [place] [address] [phone]

## 2017-02-18 NOTE — Patient Instructions (Addendum)
Medication Instructions:  Your physician has recommended you make the following change in your medication:  1- START Ranexa 500 mg(1 tablet) by mouth two times a day.   Labwork: Your physician recommends that you return for lab work in: TODAY [facility] (CBC, BMP, PT/INR).   Testing/Procedures: Your physician has requested that you have an echocardiogram. Echocardiography is a painless test that uses sound waves to create images of your heart. It provides your doctor with information about the size and shape of your heart and how well your heart's chambers and valves are working. This procedure takes approximately one hour. There are no restrictions for this procedure.  Your physician has requested that you have a lower extremity arterial doppler- During this test, ultrasound is used to evaluate arterial blood flow in the legs. Allow approximately one hour for this exam.   Your physician has requested that you have an ankle brachial index (ABI). During this test an ultrasound and blood pressure cuff are used to evaluate the arteries that supply the arms and legs with blood. Allow thirty minutes for this exam. There are no restrictions or special instructions.     Your physician has requested that you have a LEFT cardiac catheterization. Cardiac catheterization is used to diagnose and/or treat various heart conditions. Doctors may recommend this procedure for a number of different reasons. The most common reason is to evaluate chest pain. Chest pain can be a symptom of coronary artery disease (CAD), and cardiac catheterization can show whether plaque is narrowing or blocking your heart's arteries. This procedure is also used to evaluate the valves, as well as measure the blood flow and oxygen levels in different parts of your heart. For further information please visit [email or web address]. Please follow instruction sheet, as given.   [facility] Cardiac Cath Instructions   You are scheduled for  a Cardiac Cath on:___11/1/18_______  Please arrive at _07:30_am on the day of your procedure  Please expect a call from our [facility] to pre-register you  Do not eat/drink anything after midnight  Someone will need to drive you home  It is recommended someone be with you for the first 24 hours after your procedure  Wear clothes that are easy to get on/off and wear slip on shoes if possible   Medications bring a current list of all medications with you  _X_ You may take all of your medications (EXCEPT LISTED BELOW) the morning of your procedure with enough water to swallow safely  ___ Do not take these medications before your procedure:____TAKE 1/2 DOSE OF LANTUS THE NIGHT BEFORE.    Day of your procedure: [facility]  Free valet service is available.  After entering the [facility] please check-in at the registration desk (1st desk on your right) to receive your armband. After receiving your armband someone will escort you to the cardiac cath/special procedures waiting area.  The usual length of stay after your procedure is about 2 to 3 hours.  This can vary.  If you have any questions, please call our office at [phone], or you may call the cardiac cath lab at [facility] directly at [phone]   Follow-Up: Your physician recommends that you schedule a follow-up appointment in: 2 WEEKS WITH RYAN OR DR Mariah MillingGOLLAN.   If you need a refill on your cardiac medications before your next appointment, please call your pharmacy.    Coronary Angiogram With Stent Coronary angiogram with stent placement is a procedure to widen  or open a narrow blood vessel of the heart (coronary artery). Arteries may become blocked by cholesterol buildup (plaques) in the lining or wall. When a coronary artery becomes partially blocked, blood flow to that area decreases. This may lead to chest pain or a heart attack (myocardial infarction). A stent is a small  piece of metal that looks like mesh or a spring. Stent placement may be done as treatment for a heart attack or right after a coronary angiogram in which a blocked artery is found. Let your health care provider know about:  Any allergies you have.  All medicines you are taking, including vitamins, herbs, eye drops, creams, and over-the-counter medicines.  Any problems you or family members have had with anesthetic medicines.  Any blood disorders you have.  Any surgeries you have had.  Any medical conditions you have.  Whether you are pregnant or may be pregnant. What are the risks? Generally, this is a safe procedure. However, problems may occur, including:  Damage to the heart or its blood vessels.  A return of blockage.  Bleeding, infection, or bruising at the insertion site.  A collection of blood under the skin (hematoma) at the insertion site.  A blood clot in another part of the body.  Kidney injury.  Allergic reaction to the dye or contrast that is used.  Bleeding into the abdomen (retroperitoneal bleeding).  What happens before the procedure? Staying hydrated Follow instructions from your health care provider about hydration, which may include:  Up to 2 hours before the procedure - you may continue to drink clear liquids, such as water, clear fruit juice, black coffee, and plain tea.  Eating and drinking restrictions Follow instructions from your health care provider about eating and drinking, which may include:  8 hours before the procedure - stop eating heavy meals or foods such as meat, fried foods, or fatty foods.  6 hours before the procedure - stop eating light meals or foods, such as toast or cereal.  2 hours before the procedure - stop drinking clear liquids.  Ask your health care provider about:  Changing or stopping your regular medicines. This is especially important if you are taking diabetes medicines or blood thinners.  Taking medicines such  as ibuprofen. These medicines can thin your blood. Do not take these medicines before your procedure if your health care provider instructs you not to. Generally, aspirin is recommended before a procedure of passing a small, thin tube (catheter) through a blood vessel and into the heart (cardiac catheterization).  What happens during the procedure?  An IV tube will be inserted into one of your veins.  You will be given one or more of the following: ? A medicine to help you relax (sedative). ? A medicine to numb the area where the catheter will be inserted into an artery (local anesthetic).  To reduce your risk of infection: ? Your health care team will wash or sanitize their hands. ? Your skin will be washed with soap. ? Hair may be removed from the area where the catheter will be inserted.  Using a guide wire, the catheter will be inserted into an artery. The location may be in your groin, in your wrist, or in the fold of your arm (near your elbow).  A type of X-ray (fluoroscopy) will be used to help guide the catheter to the opening of the arteries in the heart.  A dye will be injected into the catheter, and X-rays will be taken. The  dye will help to show where any narrowing or blockages are located in the arteries.  A tiny wire will be guided to the blocked spot, and a balloon will be inflated to make the artery wider.  The stent will be expanded and will crush the plaques into the wall of the vessel. The stent will hold the area open and improve the blood flow. Most stents have a drug coating to reduce the risk of the stent narrowing over time.  The artery may be made wider using a drill, laser, or other tools to remove plaques.  When the blood flow is better, the catheter will be removed. The lining of the artery will grow over the stent, which stays where it was placed. This procedure may vary among health care providers and hospitals. What happens after the procedure?  If the  procedure is done through the leg, you will be kept in bed lying flat for about 6 hours. You will be instructed to not bend and not cross your legs.  The insertion site will be checked frequently.  The pulse in your foot or wrist will be checked frequently.  You may have additional blood tests, X-rays, and a test that records the electrical activity of your heart (electrocardiogram, or ECG). This information is not intended to replace advice given to you by your health care provider. Make sure you discuss any questions you have with your health care provider. Document Released: 10/12/2002 Document Revised: 12/06/2015 Document Reviewed: 11/11/2015 Elsevier Interactive Patient Education  2017 [employer].

## 2017-02-19 ENCOUNTER — Encounter: Payer: Self-pay | Admitting: Physician Assistant

## 2017-02-19 ENCOUNTER — Encounter
Admission: RE | Disposition: A | Discharge status: Home / Self Care | Payer: Self-pay | Source: Ambulatory Visit | Attending: Cardiovascular Disease

## 2017-02-19 ENCOUNTER — Encounter: Payer: Self-pay | Admitting: *Deleted

## 2017-02-19 ENCOUNTER — Ambulatory Visit
Admission: RE | Admit: 2017-02-19 | Discharge: 2017-02-19 | Disposition: A | Discharge status: Home / Self Care | Payer: Medicare Other | Source: Ambulatory Visit | Attending: Cardiovascular Disease | Admitting: Cardiovascular Disease

## 2017-02-19 DIAGNOSIS — I2510 Atherosclerotic heart disease of native coronary artery without angina pectoris: Secondary | ICD-10-CM | POA: Diagnosis present

## 2017-02-19 DIAGNOSIS — E119 Type 2 diabetes mellitus without complications: Secondary | ICD-10-CM | POA: Insufficient documentation

## 2017-02-19 DIAGNOSIS — E118 Type 2 diabetes mellitus with unspecified complications: Secondary | ICD-10-CM

## 2017-02-19 DIAGNOSIS — I200 Unstable angina: Secondary | ICD-10-CM | POA: Diagnosis present

## 2017-02-19 DIAGNOSIS — F419 Anxiety disorder, unspecified: Secondary | ICD-10-CM | POA: Insufficient documentation

## 2017-02-19 DIAGNOSIS — R079 Chest pain, unspecified: Secondary | ICD-10-CM | POA: Diagnosis present

## 2017-02-19 DIAGNOSIS — I10 Essential (primary) hypertension: Secondary | ICD-10-CM | POA: Diagnosis not present

## 2017-02-19 DIAGNOSIS — I25110 Atherosclerotic heart disease of native coronary artery with unstable angina pectoris: Secondary | ICD-10-CM | POA: Diagnosis not present

## 2017-02-19 DIAGNOSIS — G4730 Sleep apnea, unspecified: Secondary | ICD-10-CM | POA: Diagnosis not present

## 2017-02-19 DIAGNOSIS — F17210 Nicotine dependence, cigarettes, uncomplicated: Secondary | ICD-10-CM | POA: Insufficient documentation

## 2017-02-19 DIAGNOSIS — Z955 Presence of coronary angioplasty implant and graft: Secondary | ICD-10-CM | POA: Insufficient documentation

## 2017-02-19 DIAGNOSIS — E785 Hyperlipidemia, unspecified: Secondary | ICD-10-CM | POA: Insufficient documentation

## 2017-02-19 DIAGNOSIS — F329 Major depressive disorder, single episode, unspecified: Secondary | ICD-10-CM | POA: Diagnosis not present

## 2017-02-19 DIAGNOSIS — E1165 Type 2 diabetes mellitus with hyperglycemia: Secondary | ICD-10-CM | POA: Diagnosis present

## 2017-02-19 DIAGNOSIS — Z720 Tobacco use: Secondary | ICD-10-CM | POA: Diagnosis present

## 2017-02-19 HISTORY — PX: LEFT HEART CATH AND CORONARY ANGIOGRAPHY: CATH118249

## 2017-02-19 SURGERY — LEFT HEART CATH AND CORONARY ANGIOGRAPHY
Anesthesia: Moderate Sedation | Laterality: Left

## 2017-02-19 SURGERY — LEFT HEART CATH AND CORONARY ANGIOGRAPHY
Anesthesia: Moderate Sedation

## 2017-02-19 MED ORDER — ONDANSETRON HCL 4 MG/2ML IJ SOLN: 4.0000 mg | Freq: Four times a day (QID) | INTRAMUSCULAR | Status: DC | PRN

## 2017-02-19 MED ORDER — SODIUM CHLORIDE 0.9 % WEIGHT BASED INFUSION: 1.0000 mL/kg/h | INTRAVENOUS | Status: DC

## 2017-02-19 MED ORDER — MIDAZOLAM HCL 2 MG/2ML IJ SOLN
INTRAMUSCULAR | Status: AC
  Filled 2017-02-19: qty 2

## 2017-02-19 MED ORDER — SODIUM CHLORIDE 0.9 % IV SOLN: 250.0000 mL | INTRAVENOUS | Status: DC | PRN

## 2017-02-19 MED ORDER — SODIUM CHLORIDE 0.9% FLUSH: 3.0000 mL | Freq: Two times a day (BID) | INTRAVENOUS | Status: DC

## 2017-02-19 MED ORDER — LIDOCAINE HCL (PF) 1 % IJ SOLN
INTRAMUSCULAR | Status: DC | PRN
  Administered 2017-02-19: 25 mL via INTRADERMAL

## 2017-02-19 MED ORDER — SODIUM CHLORIDE 0.9% FLUSH: 3.0000 mL | INTRAVENOUS | Status: DC | PRN

## 2017-02-19 MED ORDER — MIDAZOLAM HCL 2 MG/2ML IJ SOLN
INTRAMUSCULAR | Status: DC | PRN
  Administered 2017-02-19 (×2): 1 mg via INTRAVENOUS

## 2017-02-19 MED ORDER — LIDOCAINE HCL (PF) 1 % IJ SOLN
INTRAMUSCULAR | Status: AC
  Filled 2017-02-19: qty 30

## 2017-02-19 MED ORDER — FENTANYL CITRATE (PF) 100 MCG/2ML IJ SOLN
INTRAMUSCULAR | Status: DC | PRN
Start: 2017-02-19 — End: 2017-02-19
  Administered 2017-02-19 (×3): 25 ug via INTRAVENOUS

## 2017-02-19 MED ORDER — ASPIRIN 81 MG PO CHEW: 81.0000 mg | CHEWABLE_TABLET | ORAL | Status: DC

## 2017-02-19 MED ORDER — IOPAMIDOL (ISOVUE-300) INJECTION 61%
INTRAVENOUS | Status: DC | PRN
  Administered 2017-02-19: 100 mL via INTRA_ARTERIAL

## 2017-02-19 MED ORDER — HEPARIN (PORCINE) IN NACL 2-0.9 UNIT/ML-% IJ SOLN
INTRAMUSCULAR | Status: AC
  Filled 2017-02-19: qty 500

## 2017-02-19 MED ORDER — FENTANYL CITRATE (PF) 100 MCG/2ML IJ SOLN
INTRAMUSCULAR | Status: AC
  Filled 2017-02-19: qty 2

## 2017-02-19 MED ORDER — ACETAMINOPHEN 325 MG PO TABS: 650.0000 mg | ORAL_TABLET | ORAL | Status: DC | PRN

## 2017-02-19 MED ORDER — SODIUM CHLORIDE 0.9 % WEIGHT BASED INFUSION
3.0000 mL/kg/h | INTRAVENOUS | Status: AC
  Administered 2017-02-19: 3 mL/kg/h via INTRAVENOUS

## 2017-02-19 SURGICAL SUPPLY — 10 items
CATH INFINITI 5FR ANG PIGTAIL (CATHETERS) ×3 IMPLANT
CATH INFINITI 5FR JL4 (CATHETERS) ×3 IMPLANT
CATH INFINITI JR4 5F (CATHETERS) ×3 IMPLANT
DEVICE CLOSURE MYNXGRIP 5F (Vascular Products) ×3 IMPLANT
KIT MANI 3VAL PERCEP (MISCELLANEOUS) ×3 IMPLANT
NEEDLE PERC 18GX7CM (NEEDLE) ×3 IMPLANT
NEEDLE SMART 18G ACCESS (NEEDLE) ×3 IMPLANT
PACK CARDIAC CATH (CUSTOM PROCEDURE TRAY) ×3 IMPLANT
SHEATH AVANTI 5FR X 11CM (SHEATH) ×3 IMPLANT
WIRE EMERALD 3MM-J .035X150CM (WIRE) ×3 IMPLANT

## 2017-02-19 NOTE — Discharge Instructions (Signed)

## 2017-02-19 NOTE — OR Nursing (Signed)
Patient unable to get in contact with x husband/friend , to come pick her up...getting anxious, pressured speech, pulling off bloodpressure cuff and stating "I will walk to where he works and get my car" explained to patient multiple times the danger to self in leaving and walking down road s/p cardiac cathaterication, and sedation...[name].offered to escort out to smoke and wait with her till ride gets here, eventually ride showed up and patient was helped in car .

## 2017-02-19 NOTE — Progress Notes (Signed)
   LHC performed 11/1 showed moderate ISR of proximal to mid RCA estimated at 50-60%. Medical management was advised after MD discussed case with interventional cardiology. See details below. Following the procedure, the patient was requesting narcotic pain medication and Xanax. She was very upset when we declined to prescribe these controlled substances and threatened to leave AMA. She has a history of requesting higher sedating medications during procedures. She also asked me for prescription "for my nerves" at our office visit on 10/31, which I advised her she would need to see her PCP or psychiatrist. She knows she has a follow up appointment with her PCP on 11/2 as this was discussed with her on 10/31 cardiology visit and was told to her on 11/1 following her procedure. It was recommended she pursue aggressive lifestyle modification in addition to medical management of CAD. She will need to stop smoking, needs improved control in her diabetes, and would benefit from cardiac rehab.

## 2017-02-20 ENCOUNTER — Other Ambulatory Visit: Payer: Self-pay | Admitting: Cardiovascular Disease

## 2017-02-20 ENCOUNTER — Encounter: Payer: Self-pay | Admitting: Cardiovascular Disease

## 2017-02-20 NOTE — H&P (Signed)
H&P Addendum, precardiac catheterization  Patient was seen and evaluated prior to Cardiac catheterization procedure Symptoms, prior testing details again confirmed with the patient Patient examined, no significant change from prior exam Lab work reviewed in detail personally by myself Patient understands risk and benefit of the procedure, willing to proceed  Signed, Tim Gollan, MD, Ph.D [facility]    

## 2017-02-24 ENCOUNTER — Ambulatory Visit (INDEPENDENT_AMBULATORY_CARE_PROVIDER_SITE_OTHER): Payer: Medicare Other

## 2017-02-24 ENCOUNTER — Other Ambulatory Visit: Payer: Self-pay

## 2017-02-24 DIAGNOSIS — I739 Peripheral vascular disease, unspecified: Secondary | ICD-10-CM

## 2017-02-24 DIAGNOSIS — Z01810 Encounter for preprocedural cardiovascular examination: Secondary | ICD-10-CM | POA: Diagnosis not present

## 2017-02-24 DIAGNOSIS — I200 Unstable angina: Secondary | ICD-10-CM | POA: Diagnosis not present

## 2017-03-02 ENCOUNTER — Telehealth: Payer: Self-pay | Admitting: Cardiovascular Disease

## 2017-03-02 NOTE — Telephone Encounter (Signed)
Spoke with patient and she states that her blood pressures have been running high. Patient states that her blood pressure is now 126/85. She reports that she has been under some stress and that she is trying to do better with that. Instructed her to start monitoring her blood pressures and keep a log of those readings to bring into her upcoming appointment. Advised her to call back if it persists greater than 140/90. She verbalized understanding of our conversation, agreement with plan, and had no further questions at this time.

## 2017-03-02 NOTE — Telephone Encounter (Signed)
No answer/Voicemail box is full.  

## 2017-03-02 NOTE — Telephone Encounter (Signed)
Pt calling stating she was advised to call if her BP was high   Pt c/o BP issue: STAT if pt c/o blurred vision, one-sided weakness or slurred speech  1. What are your last 5 BP readings?   1 hour ago 149/92 Last night 148/97  Yesterday afternoon 151/83 Yesterday morning 154/95 02/28/17  Pm 144/76 pm 146/104  2. Are you having any other symptoms (ex. Dizziness, headache, blurred vision, passed out)?  Still having CP, couples times a day   3. What is your BP issue?  BP is high

## 2017-03-17 ENCOUNTER — Ambulatory Visit (INDEPENDENT_AMBULATORY_CARE_PROVIDER_SITE_OTHER): Payer: Medicare Other | Admitting: Nurse Practitioner

## 2017-03-17 ENCOUNTER — Encounter: Payer: Self-pay | Admitting: Nurse Practitioner

## 2017-03-17 VITALS — BP 120/84 | HR 91 | Ht 62.0 in | Wt 208.0 lb

## 2017-03-17 DIAGNOSIS — I209 Angina pectoris, unspecified: Secondary | ICD-10-CM

## 2017-03-17 DIAGNOSIS — Z794 Long term (current) use of insulin: Secondary | ICD-10-CM | POA: Diagnosis not present

## 2017-03-17 DIAGNOSIS — I25119 Atherosclerotic heart disease of native coronary artery with unspecified angina pectoris: Secondary | ICD-10-CM

## 2017-03-17 DIAGNOSIS — I10 Essential (primary) hypertension: Secondary | ICD-10-CM

## 2017-03-17 DIAGNOSIS — E119 Type 2 diabetes mellitus without complications: Secondary | ICD-10-CM

## 2017-03-17 DIAGNOSIS — M79662 Pain in left lower leg: Secondary | ICD-10-CM

## 2017-03-17 DIAGNOSIS — E785 Hyperlipidemia, unspecified: Secondary | ICD-10-CM

## 2017-03-17 DIAGNOSIS — I200 Unstable angina: Secondary | ICD-10-CM

## 2017-03-17 DIAGNOSIS — Z720 Tobacco use: Secondary | ICD-10-CM | POA: Diagnosis not present

## 2017-03-17 DIAGNOSIS — M7989 Other specified soft tissue disorders: Secondary | ICD-10-CM

## 2017-03-17 MED ORDER — AMLODIPINE BESYLATE 5 MG PO TABS: 5.0000 mg | ORAL_TABLET | Freq: Every day | ORAL | 3 refills | Status: DC

## 2017-03-17 NOTE — Progress Notes (Signed)
Office Visit    Patient Name: Jennifer [name] Date of Encounter: 03/17/2017  Primary Care Provider:  Jeronimo Greaves, MD Primary Cardiologist:  Johnny Bridge, MD   Chief Complaint    56 y/o ? with a history of coronary artery disease status post multiple right coronary artery interventions, hypertension, hyperlipidemia, depression, anxiety, bipolar disorder, diabetes, and ongoing tobacco abuse, who presents for follow-up after recent hospitalization.  Past Medical History    Past Medical History:  Diagnosis Date  . Anxiety   . Arthritis    "qwhere" (06/14/2014)  . Bipolar disorder ([place])   . CAD (coronary artery disease)    a. 2008 PCI/DES to RCA; b. 2012 PCI: 95% mRCA s/p PCI/DES; b. 2/16 PCI DES-> 90% dRCA; c. 11/16 PCI: dRCA ISR s/p PCI/DES;  d. 4/18 PCI: p-mRCA 95% s/p PCI/DES; e. 02/2017 Cath: LML nl, LAD nl, D1 40, LCX min irregs, OM1 60, RCA 50-60p/m ISR, 74md, patent dRCA stent, RPDA 90ost (fills via L->R collats), EF 55-65%.  . Chronic bronchitis ([place]    "get it q yr"  . Daily headache    "when my blood pressure is up" (06/14/2014)  . Depression   . Diastolic dysfunction    a. 02/2017 Echo: EF 55-60%, no rwma, Gr1 DD, nl RV fxn.  .Marland KitchenGERD (gastroesophageal reflux disease)   . Heart murmur   . High cholesterol   . History of stomach ulcers   . Hypertension   . Insomnia   . Kidney stones   . Migraine    "haven't had them in a good while; did get them 1-2 times/yr" (06/14/2014)  . Myocardial infarction ([place] 2008; ~ 2012  . Pneumonia    "get it 1-2 times/year" (06/14/2014)  . RLS (restless legs syndrome)   . Seizures ([place]   . Sleep apnea    "they want me to do the lab but I haven't" (06/14/2014)  . Stroke ([facility]    "they say I've had several" (06/14/2014)  . Tobacco abuse   . Type II diabetes mellitus ([place]   . Urinary, incontinence, stress female    Past Surgical History:  Procedure Laterality Date  . ABDOMINAL HYSTERECTOMY  1984   "I've got 1 ovary left"  .  CARDIAC CATHETERIZATION N/A 02/26/2015   Procedure: Left Heart Cath and Coronary Angiography;  Surgeon: MWellington Hampshire MD;  [facility];  Service: Cardiovascular;  Laterality: N/A;  . CARDIAC CATHETERIZATION N/A 02/26/2015   Procedure: Coronary Stent Intervention;  Surgeon: MWellington Hampshire MD;  [facility];  Service: Cardiovascular;  Laterality: N/A;  . CESAREAN SECTION  1984  . CORONARY ANGIOPLASTY WITH STENT PLACEMENT  2008; ~ 2012   "1; 1"  . CORONARY STENT INTERVENTION N/A 08/04/2016   Procedure: Coronary Stent Intervention;  Surgeon: MWellington Hampshire MD;  [facility];  Service: Cardiovascular;  Laterality: N/A;  . DILATION AND CURETTAGE OF UTERUS    . EXTRACORPOREAL SHOCK WAVE LITHOTRIPSY  X 2  . KNEE ARTHROSCOPY Left   . LEFT HEART CATH Bilateral 08/04/2016   Procedure: Left Heart Cath poss PCI;  Surgeon: MWellington Hampshire MD;  [facility];  Service: Cardiovascular;  Laterality: Bilateral;  . LEFT HEART CATH AND CORONARY ANGIOGRAPHY Left 02/19/2017   Procedure: LEFT HEART CATH AND CORONARY ANGIOGRAPHY;  Surgeon: GMinna Merritts MD;  [facility];  Service: Cardiovascular;  Laterality: Left;  . LEFT HEART CATHETERIZATION WITH  CORONARY ANGIOGRAM N/A 06/15/2014   Procedure: LEFT HEART CATHETERIZATION WITH CORONARY ANGIOGRAM;  Surgeon: Jettie Booze, MD;  Location: [facility] LAB;  Service: Cardiovascular;  Laterality: N/A;  . PERCUTANEOUS CORONARY STENT INTERVENTION (PCI-S)  06/15/2014   Procedure: PERCUTANEOUS CORONARY STENT INTERVENTION (PCI-S);  Surgeon: Jettie Booze, MD;  Location: [facility] CATH LAB;  Service: Cardiovascular;;  . TONSILLECTOMY    . TUBAL LIGATION      Allergies  Allergies  Allergen Reactions  . Carbamazepine Other (See Comments) and Anaphylaxis    Blood dyscrasia when on with Lithium Blood dyscrasia when on with Lithium  . Lithium Other (See Comments)    Blood  dyscrasia Blood dyscrasia  . Acetaminophen   . Codeine   . Levofloxacin In D5w Other (See Comments)    "Salty" sensation in mouth. "Hard time to explain it" "Salty" sensation in mouth. "Hard time to explain it" "Salty" sensation in mouth. "Hard time to explain it"  . Lodine [Etodolac] Other (See Comments)    Rash, N/V  . Methocarbamol   . Oxycodone-Acetaminophen   . Tape     History of Present Illness    56 y/o ? with the above complex past medical history including coronary artery disease status post multiple PCI and drug-eluting stents to the right coronary artery in the setting of recurrent in-stent restenosis dating all the way back to 2008.  Other history includes hypertension, hyperlipidemia, diabetes, obesity, ongoing tobacco abuse, depression, anxiety, and bipolar disorder.  She was recently admitted to [facility] in the setting of chest discomfort and underwent diagnostic catheterization revealing patent stents in the right coronary artery with moderate nonobstructive in-stent restenosis.  Medical therapy was recommended.  She also had an outpatient echocardiogram which showed normal LV function with grade 1 diastolic dysfunction.  Since her hospitalization, she reports doing well for the most part.  Her biggest complaint is right shoulder pain for which she would love to have surgery but she has not been a good candidate in the past secondary to reluctance to discontinue Plavix in the setting of multiple in-stent restenosis episodes.  She has intermittent chest discomfort though she is not able to describe it and cannot really quantify how often it occurs.  May be 2-3 times per week she thinks.  She checks her blood pressure at home and it typically runs in the 150s.  She denies PND, orthopnea, palpitations, dizziness, syncope, or early satiety.  Over the past 2 weeks, she has had some swelling behind her left knee.  She discussed this with her primary care provider and was advised  to tell cardiology.  Home Medications    Prior to Admission medications   Medication Sig Start Date End Date Taking? Authorizing Provider  albuterol (PROVENTIL HFA;VENTOLIN HFA) 108 (90 Base) MCG/ACT inhaler Inhale 1-2 puffs into the lungs every 4 (four) hours as needed.  02/11/17 02/11/18 Yes [provider]  amLODipine (NORVASC) 5 MG tablet Take 1 tablet (5 mg total) by mouth daily. 03/17/17  Yes Rogelia Mire, NP  ARIPiprazole (ABILIFY) 10 MG tablet Take 10 mg by mouth daily.  03/04/17 04/03/17 Yes [provider]  aspirin EC 81 MG EC tablet Take 1 tablet (81 mg total) by mouth daily. 02/27/15  Yes Gladstone Lighter, MD  atorvastatin (LIPITOR) 40 MG tablet take 1 tablet by mouth once daily as directed 01/14/17  Yes Gollan, Kathlene November, MD  Blood Glucose Monitoring Suppl W/DEVICE KIT Test 1-2 times daily; E11.65 diagnosis 03/02/15  Yes Doss,  Velora Heckler, RN  clopidogrel (PLAVIX) 75 MG tablet take 1 tablet by mouth once daily WITH BREAKFAST 08/27/16  Yes Gollan, Kathlene November, MD  cyclobenzaprine (FLEXERIL) 10 MG tablet Take 1 tablet by mouth 3 (three) times daily. 07/17/16  Yes [provider]  furosemide (LASIX) 20 MG tablet take 1 tablet by mouth once daily 02/20/17  Yes Dunn, Areta Haber, PA-C  gabapentin (NEURONTIN) 600 MG tablet take 1.5 tablets by mouth four times a day 02/27/17  Yes [provider]  Insulin Glargine (BASAGLAR KWIKPEN) 100 UNIT/ML SOPN Inject 50 units at night and 20 units in the morning. 03/04/17  Yes [provider]  liraglutide (VICTOZA) 18 MG/3ML SOPN Victoza 3-Pak 0.6 mg/0.1 mL (18 mg/3 mL) subcutaneous pen injector 02/20/17  Yes [provider]  lisinopril (PRINIVIL,ZESTRIL) 40 MG tablet take 1 tablet by mouth once daily 07/18/16  Yes Gollan, Kathlene November, MD  metoprolol (TOPROL-XL) 200 MG 24 hr tablet take 1 tablet by mouth once daily with OR IMMEDIETLY FOLLOWING A MEAL 08/11/16  Yes Gollan, Kathlene November, MD  Multiple  Vitamins-Minerals (MULTIVITAMIN WITH MINERALS) tablet Take 1 tablet by mouth daily.   Yes [provider]  nitroGLYCERIN (NITROSTAT) 0.4 MG SL tablet Place 1 tablet (0.4 mg total) under the tongue every 5 (five) minutes as needed for chest pain (Do not give more than 3 SL tablets in 15 minutes.). 08/05/16  Yes Fritzi Mandes, MD  pantoprazole (PROTONIX) 20 MG tablet Take 20 mg by mouth daily.  03/09/17 03/09/18 Yes [provider]  ranolazine (RANEXA) 500 MG 12 hr tablet Take 1 tablet (500 mg total) by mouth 2 (two) times daily. 02/18/17  Yes Rise Mu, PA-C    Review of Systems    Chronic bilateral right greater than left shoulder pain with reduced range of motion, left posterior leg swelling at the level of the knee, chronic dyspnea on exertion, intermittent chest discomfort.  She denies palpitations, PND, orthopnea, dizziness, syncope, edema, or early satiety.  All other systems reviewed and are otherwise negative except as noted above.  Physical Exam    VS:  BP 120/84 (BP Location: Left Arm, Patient Position: Sitting, Cuff Size: Normal)   Pulse 91   Ht _0  (1.575 m)   Wt 208 lb (94.3 kg)   BMI 38.04 kg/m  , BMI Body mass index is 38.04 kg/m. GEN: Well nourished, well developed, in no acute distress.  HEENT: normal.  Neck: Supple, no JVD, carotid bruits, or masses. Cardiac: RRR, no murmurs, rubs, or gallops. No clubbing, cyanosis, edema.  Radials/DP/PT 2+ and equal bilaterally.  Respiratory:  Respirations regular and unlabored, clear to auscultation bilaterally. GI: Soft, nontender, nondistended, BS + x 4. MS: no deformity or atrophy. Skin: warm and dry, no rash. Neuro:  Strength and sensation are intact. Psych: Normal affect. Extremities: Mild swelling and tenderness noted-left lower leg at the level of the knee.  Accessory Clinical Findings    ECG -regular sinus rhythm, 91, no acute ST or T changes.  Assessment & Plan    1.  Coronary artery disease: Status  post multiple stents to the right coronary artery with recent catheterization in early November revealing patency of said stents without requirement for intervention at that time.  From a cardiac standpoint she has done reasonably well though continues to have intermittent rest and exertional chest discomfort for which she is on Ranexa as well as as needed nitrates.  She otherwise remains on aspirin, Plavix, statin, beta-blocker, ACE inhibitor,  and calcium channel blocker.  2.  Essential hypertension: Blood pressure stable today but runs in the 150s at home.  I am going to titrate her amlodipine to 5 mg daily.  Next  3.  Hyperlipidemia: Remains on statin therapy.  LDL 70 20 July 2016.  4.  Tobacco abuse/COPD: Continues to smoke up to 5 cigarettes/day.  Complete cessation advised.  She is scheduled for PFTs in a couple weeks through primary care.  5.  Morbid obesity: Patient is sedentary.  She "eats whatever the kids eat".  This appears to be quite a bit of prepackaged and processed foods.  Encouraged her to shift towards more fresh foods, especially vegetables.  6.  Type 2 diabetes mellitus: Followed by primary care.  7.  Left posterior leg swelling: This is been present for approximately 2 weeks.  This is right behind the knee.  It is mildly tender.  No erythema.  Question DVT versus Baker's cyst.  I will follow-up an ultrasound.  8.  Disposition: Follow-up in clinic in 3 months or sooner if necessary.  [name] Hodgkins, NP 03/17/2017, 1:00 PM

## 2017-03-17 NOTE — Patient Instructions (Addendum)
Medication Instructions:  Please INCREASE your amlodipine to 5 mg daily  Labwork: None  Testing/Procedures: Your physician has requested that you have a lower extremity venous duplex. This test is an ultrasound of the veins in the legs or arms. It looks at venous blood flow that carries blood from the heart to the legs or arms. Allow one hour for a Lower Venous exam. Allow thirty minutes for an Upper Venous exam. There are no restrictions or special instructions.  Follow-Up: 3 months with Dr. Mariah Milling  If you need a refill on your cardiac medications before your next appointment, please call your pharmacy.   Steps to Quit Smoking Smoking tobacco can be bad for your health. It can also affect almost every organ in your body. Smoking puts you and people around you at risk for many serious long-lasting (chronic) diseases. Quitting smoking is hard, but it is one of the best things that you can do for your health. It is never too late to quit. What are the benefits of quitting smoking? When you quit smoking, you lower your risk for getting serious diseases and conditions. They can include:  Lung cancer or lung disease.  Heart disease.  Stroke.  Heart attack.  Not being able to have children (infertility).  Weak bones (osteoporosis) and broken bones (fractures).  If you have coughing, wheezing, and shortness of breath, those symptoms may get better when you quit. You may also get sick less often. If you are pregnant, quitting smoking can help to lower your chances of having a baby of low birth weight. What can I do to help me quit smoking? Talk with your doctor about what can help you quit smoking. Some things you can do (strategies) include:  Quitting smoking totally, instead of slowly cutting back how much you smoke over a period of time.  Going to in-person counseling. You are more likely to quit if you go to many counseling sessions.  Using resources and support systems, such  as: ? Agricultural engineer with a Veterinary surgeon. ? Phone quitlines. ? Automotive engineer. ? Support groups or group counseling. ? Text messaging programs. ? Mobile phone apps or applications.  Taking medicines. Some of these medicines may have nicotine in them. If you are pregnant or breastfeeding, do not take any medicines to quit smoking unless your doctor says it is okay. Talk with your doctor about counseling or other things that can help you.  Talk with your doctor about using more than one strategy at the same time, such as taking medicines while you are also going to in-person counseling. This can help make quitting easier. What things can I do to make it easier to quit? Quitting smoking might feel very hard at first, but there is a lot that you can do to make it easier. Take these steps:  Talk to your family and friends. Ask them to support and encourage you.  Call phone quitlines, reach out to support groups, or work with a Veterinary surgeon.  Ask people who smoke to not smoke around you.  Avoid places that make you want (trigger) to smoke, such as: ? Bars. ? Parties. ? Smoke-break areas at work.  Spend time with people who do not smoke.  Lower the stress in your life. Stress can make you want to smoke. Try these things to help your stress: ? Getting regular exercise. ? Deep-breathing exercises. ? Yoga. ? Meditating. ? Doing a body scan. To do this, close your eyes, focus on one area  of your body at a time from head to toe, and notice which parts of your body are tense. Try to relax the muscles in those areas.  Download or buy apps on your mobile phone or tablet that can help you stick to your quit plan. There are many free apps, such as QuitGuide from the [employer] developer(Centers for Disease Control and Prevention). You can find more support from smokefree.gov and other websites.  This information is not intended to replace advice given to you by your health care provider. Make sure you  discuss any questions you have with your health care provider. Document Released: 02/01/2009 Document Revised: 12/04/2015 Document Reviewed: 08/22/2014 Elsevier Interactive Patient Education  2018 [employer]  Health Risks of Smoking Smoking cigarettes is very bad for your health. Tobacco smoke has over 200 known poisons in it. It contains the poisonous gases nitrogen oxide and carbon monoxide. There are over 60 chemicals in tobacco smoke that cause cancer. Smoking is difficult to quit because a chemical in tobacco, called nicotine, causes addiction or dependence. When you smoke and inhale, nicotine is absorbed rapidly into the bloodstream through your lungs. Both inhaled and non-inhaled nicotine may be addictive. What are the risks of cigarette smoke? Cigarette smokers have an increased risk of many serious medical problems, including:  Lung cancer.  Lung disease, such as pneumonia, bronchitis, and emphysema.  Chest pain (angina) and heart attack because the heart is not getting enough oxygen.  Heart disease and peripheral blood vessel disease.  High blood pressure (hypertension).  Stroke.  Oral cancer, including cancer of the lip, mouth, or voice box.  Bladder cancer.  Pancreatic cancer.  Cervical cancer.  Pregnancy complications, including premature birth.  Stillbirths and smaller newborn babies, birth defects, and genetic damage to sperm.  Early menopause.  Lower estrogen level for women.  Infertility.  Facial wrinkles.  Blindness.  Increased risk of broken bones (fractures).  Senile dementia.  Stomach ulcers and internal bleeding.  Delayed wound healing and increased risk of complications during surgery.  Even smoking lightly shortens your life expectancy by several years.  Because of secondhand smoke exposure, children of smokers have an increased risk of the following:  Sudden infant death syndrome (SIDS).  Respiratory infections.  Lung  cancer.  Heart disease.  Ear infections.  What are the benefits of quitting? There are many health benefits of quitting smoking. Here are some of them:  Within days of quitting smoking, your risk of having a heart attack decreases, your blood flow improves, and your lung capacity improves. Blood pressure, pulse rate, and breathing patterns start returning to normal soon after quitting.  Within months, your lungs may clear up completely.  Quitting for 10 years reduces your risk of developing lung cancer and heart disease to almost that of a nonsmoker.  People who quit may see an improvement in their overall quality of life.  How do I quit smoking? Smoking is an addiction with both physical and psychological effects, and longtime habits can be hard to change. Your health care provider can recommend:  Programs and community resources, which may include group support, education, or talk therapy.  Prescription medicines to help reduce cravings.  Nicotine replacement products, such as patches, gum, and nasal sprays. Use these products only as directed. Do not replace cigarette smoking with electronic cigarettes, which are commonly called e-cigarettes. The safety of e-cigarettes is not known, and some may contain harmful chemicals.  A combination of two or more of these methods.  Where to find more information:  American Lung Association: www.lung.org  American Cancer Society: www.cancer.org Summary  Smoking cigarettes is very bad for your health. Cigarette smokers have an increased risk of many serious medical problems, including several cancers, heart disease, and stroke.  Smoking is an addiction with both physical and psychological effects, and longtime habits can be hard to change.  By stopping right away, you can greatly reduce the risk of medical problems for you and your family.  To help you quit smoking, your health care provider can recommend programs, community resources,  prescription medicines, and nicotine replacement products such as patches, gum, and nasal sprays. This information is not intended to replace advice given to you by your health care provider. Make sure you discuss any questions you have with your health care provider. Document Released: 05/15/2004 Document Revised: 04/11/2016 Document Reviewed: 04/11/2016 Elsevier Interactive Patient Education  2017 Elsevier Inc.  Secondhand Smoke What is secondhand smoke? Secondhand smoke is smoke that comes from burning tobacco. It could be the smoke from a cigarette, a pipe, or a cigar. Even if you are not the one smoking, secondhand smoke exposes you to the dangers of smoking. This is called involuntary, or passive, smoking. There are two types of secondhand smoke:  Sidestream smoke is the smoke that comes off the lighted end of a cigarette, pipe, or cigar. ? This type of smoke has the highest amount of cancer-causing agents (carcinogens). ? The particles in sidestream smoke are smaller. They get into your lungs more easily.  Mainstream smoke is the smoke that is exhaled by a person who is smoking. ? This type of smoke is also dangerous to your health.  How can secondhand smoke affect my health? Studies show that there is no safe level of secondhand smoke. This smoke contains thousands of chemicals. At least 69 of them are known to cause cancer. Secondhand smoke can also cause many other health problems. It has been linked to:  Lung cancer.  Cancer of the voice box (larynx) or throat.  Cancer of the sinuses.  Brain cancer.  Bladder cancer.  Stomach cancer.  Breast cancer.  White blood cell cancers (lymphoma and leukemia).  Brain and liver tumors in children.  Heart disease and stroke in adults.  Pregnancy loss (miscarriage).  Diseases in children, such as: ? Asthma. ? Lung infections. ? Ear infections. ? Sudden infant death syndrome (SIDS). ? Slow growth.  Where can I be at risk  for exposure to secondhand smoke?  For adults, the workplace is the main source of exposure to secondhand smoke. ? Your workplace should have a policy separating smoking areas from nonsmoking areas. ? Smoking areas should have a system for ventilating and cleaning the air.  For children, the home may be the most dangerous place for exposure to secondhand smoke. ? Children who live in apartment buildings may be at risk from smoke drifting from hallways or other people's homes.  For everyone, many public places are possible sources of exposure to secondhand smoke. ? These places include restaurants, shopping centers, and parks. How can I reduce my risk for exposure to secondhand smoke? The most important thing you can do is not smoke. Discourage family members from smoking. Other ways to reduce exposure for you and your family include the following:  Keep your home smoke free.  Make sure your child care providers do not smoke.  Warn your child about the dangers of smoking and secondhand smoke.  Do not allow smoking in  your car. When someone smokes in a car, all the damaging chemicals from the smoke are confined in a small area.  Avoid public places where smoking is allowed.  This information is not intended to replace advice given to you by your health care provider. Make sure you discuss any questions you have with your health care provider. Document Released: 05/15/2004 Document Revised: 03/04/2016 Document Reviewed: 07/22/2013 Elsevier Interactive Patient Education  2017 [employer].

## 2017-03-18 ENCOUNTER — Ambulatory Visit (INDEPENDENT_AMBULATORY_CARE_PROVIDER_SITE_OTHER): Payer: Medicare Other

## 2017-03-18 DIAGNOSIS — M79662 Pain in left lower leg: Secondary | ICD-10-CM | POA: Diagnosis not present

## 2017-03-18 DIAGNOSIS — M7989 Other specified soft tissue disorders: Secondary | ICD-10-CM | POA: Diagnosis not present

## 2017-03-24 ENCOUNTER — Other Ambulatory Visit: Payer: Self-pay | Admitting: Cardiovascular Disease

## 2017-04-02 ENCOUNTER — Other Ambulatory Visit: Payer: Self-pay | Admitting: *Deleted

## 2017-04-07 ENCOUNTER — Other Ambulatory Visit: Payer: Self-pay | Admitting: *Deleted

## 2017-04-07 ENCOUNTER — Telehealth: Payer: Self-pay | Admitting: Cardiovascular Disease

## 2017-04-07 NOTE — Telephone Encounter (Signed)
Antonieta IbaGollan, Timothy J, MD  P [facility] Triage        Would she like cardiac rehab?  thx  TG   Previous Messages    ----- Message -----  From: Rudy JewBice, Susanne P, RN  Sent: 03/10/2017  1:12 PM  To: Antonieta Ibaimothy J Gollan, MD  Subject: referral update                  I saw Misty StanleyLisa had a cath this month with medical management result.  If you want to send a referral, we can get her into Cardiac Rehab for the stent placed in April this year. She never called us back when we reached out in April.  Lynnae SandhoffSusanne        I spoke with the patient and she is agreeable with starting cardiac rehab. Referral placed.

## 2017-05-09 ENCOUNTER — Other Ambulatory Visit: Payer: Self-pay | Admitting: Physician Assistant

## 2017-05-27 ENCOUNTER — Telehealth: Payer: Self-pay | Admitting: Cardiovascular Disease

## 2017-05-27 NOTE — H&P (View-Only) (Signed)
Cardiology Office Note  Date:  05/28/2017   ID:  Roise, Emert April 05, 1961, MRN [id number]  PCP:  Jeronimo Greaves, MD   Chief Complaint  Patient presents with  . Other    Patient c/o chest pain, left arm and jaw pain, and nausea mostly at night. Meds reviewed verbally with patient.     HPI:  Ms. Zimmerle is a 57 year old woman with long history of  Poorly controlled diabetes type 2, hemoglobin A1c 13 Active smoker coronary artery disease, stent to the mid RCA 2008, repeat stenting to the mid RCA 2012 ,   catheterization February 2016 showing severe distal RCA disease estimated at 90%, patent mid RCA stents, also with 70% mid to distal circumflex disease of a small vessel,  Catheterization April 2018 stent to the right Catheterization November 2018 medical management recommended presenting for routine follow-up of her coronary artery disease  Cardiac catheterization November 2018 Medical management recommended for moderate proximal RCA disease, left circumflex disease  Echocardiogram done November 2018 Normal LV function  cardiac catheterization April 2018 for unstable angina, minimal troponin elevation,  Procedure April 2018 with ISR of RCA stent proximal to mid Vessel PCI with resolute onyx 3.5 x 30 mm  In follow-up today she reports having worsening chest pain radiating up to her neck over the past week or so Typically happens at nighttime at rest On last office visit also reported having occasional chest pain Now she is taking nitroglycerin for symptoms Some symptoms on exertion over the past several days like in the morning Symptoms often resolved with a big belch Trouble receiving her Protonix prescription  Still smoking Has not seen primary care in follow-up for her diabetes  EKG personally reviewed by myself on todays visit Shows normal sinus rhythm with rate 82 bpm no significant ST or T wave changes   Other past medical history reviewed lost her 59-monthold  grandson, died in the crib She attempted rescue breaths  diabetes,  hemoglobin A1c greater than 13 started on insulin,  struggles to keep her sugars down  Despite her best efforts with diet, did not notice much improvement   Thyroid nodule, [facility]  Last stress test 03/19/2015  She reports having significant leg pain, unable to walk very far secondary to discomfort.   reports having chronic pain issues, sees the pain clinic   Cardiac catheterization February 2016   The left main coronary artery is a small vessel but widely patent. The left anterior descending artery,  In the ostium of the lower pole, there is a moderate stenosis. The left circumflex artery AV groove circumflex has a focal 70% stenosis. This vessel is quite small, likely less than 2 mm in diameter. The right coronary artery stents in the mid right coronary artery appear widely patent. There is mild disease proximally. In the distal RCA, moderate disease followed by a focal severe, 90% stenosis.  A 2.5 x 27 drug-eluting stent was deployed. postdilated with a 3.0 x 15 balloon.    PMH:   has a past medical history of Anxiety, Arthritis, Bipolar disorder ([place], CAD (coronary artery disease), Chronic bronchitis ([place], Daily headache, Depression, Diastolic dysfunction, GERD (gastroesophageal reflux disease), Heart murmur, High cholesterol, History of stomach ulcers, Hypertension, Insomnia, Kidney stones, Migraine, Myocardial infarction ([place] (2008; ~ 2012), Pneumonia, RLS (restless legs syndrome), Seizures ([place], Sleep apnea, Stroke ([place], Tobacco abuse, Type II diabetes mellitus ([place], and Urinary, incontinence, stress female.  PSH:    Past Surgical  History:  Procedure Laterality Date  . ABDOMINAL HYSTERECTOMY  1984   "I've got 1 ovary left"  . CARDIAC CATHETERIZATION N/A 02/26/2015   Procedure: Left Heart Cath and Coronary Angiography;  Surgeon: Wellington Hampshire, MD;  Location: [facility];  Service: Cardiovascular;  Laterality: N/A;  . CARDIAC CATHETERIZATION N/A 02/26/2015   Procedure: Coronary Stent Intervention;  Surgeon: Wellington Hampshire, MD;  Location: [place] [facility];  Service: Cardiovascular;  Laterality: N/A;  . CESAREAN SECTION  1984  . CORONARY ANGIOPLASTY WITH STENT PLACEMENT  2008; ~ 2012   "1; 1"  . CORONARY STENT INTERVENTION N/A 08/04/2016   Procedure: Coronary Stent Intervention;  Surgeon: Wellington Hampshire, MD;  Location: [place] [facility];  Service: Cardiovascular;  Laterality: N/A;  . DILATION AND CURETTAGE OF UTERUS    . EXTRACORPOREAL SHOCK WAVE LITHOTRIPSY  X 2  . KNEE ARTHROSCOPY Left   . LEFT HEART CATH Bilateral 08/04/2016   Procedure: Left Heart Cath poss PCI;  Surgeon: Wellington Hampshire, MD;  Location: [facility];  Service: Cardiovascular;  Laterality: Bilateral;  . LEFT HEART CATH AND CORONARY ANGIOGRAPHY Left 02/19/2017   Procedure: LEFT HEART CATH AND CORONARY ANGIOGRAPHY;  Surgeon: Minna Merritts, MD;  Location: [place] [facility];  Service: Cardiovascular;  Laterality: Left;  . LEFT HEART CATHETERIZATION WITH CORONARY ANGIOGRAM N/A 06/15/2014   Procedure: LEFT HEART CATHETERIZATION WITH CORONARY ANGIOGRAM;  Surgeon: Jettie Booze, MD;  Location: [facility] C[facility] LAB;  Service: Cardiovascular;  Laterality: N/A;  . PERCUTANEOUS CORONARY STENT INTERVENTION (PCI-S)  06/15/2014   Procedure: PERCUTANEOUS CORONARY STENT INTERVENTION (PCI-S);  Surgeon: Jettie Booze, MD;  Location: [facility];  Service: Cardiovascular;;  . TONSILLECTOMY    . TUBAL LIGATION      Current Outpatient Medications  Medication Sig Dispense Refill  . albuterol (PROVENTIL HFA;VENTOLIN HFA) 108 (90 Base) MCG/ACT inhaler Inhale 1-2 puffs into the lungs every 4 (four) hours as needed.     [name] amLODipine (NORVASC) 5 MG tablet Take 1 tablet (5 mg total) by mouth daily. 90 tablet 3  . aspirin EC 81 MG EC tablet Take 1 tablet (81 mg total) by mouth  daily. 30 tablet 2  . atorvastatin (LIPITOR) 40 MG tablet take 1 tablet by mouth once daily as directed 90 tablet 3  . Blood Glucose Monitoring Suppl W/DEVICE KIT Test 1-2 times daily; E11.65 diagnosis 1 each 0  . clopidogrel (PLAVIX) 75 MG tablet take 1 tablet by mouth once daily WITH BREAKFAST 90 tablet 3  . cyclobenzaprine (FLEXERIL) 10 MG tablet Take 1 tablet by mouth 3 (three) times daily.    . furosemide (LASIX) 20 MG tablet take 1 tablet by mouth once daily 30 tablet 6  . gabapentin (NEURONTIN) 600 MG tablet take 1.5 tablets by mouth four times a day  0  . Insulin Glargine (BASAGLAR KWIKPEN) 100 UNIT/ML SOPN Inject 50 units at night and 20 units in the morning.    . liraglutide (VICTOZA) 18 MG/3ML SOPN Victoza 3-Pak 0.6 mg/0.1 mL (18 mg/3 mL) subcutaneous pen injector    . lisinopril (PRINIVIL,ZESTRIL) 40 MG tablet take 1 tablet by mouth once daily 90 tablet 3  . metoprolol (TOPROL-XL) 200 MG 24 hr tablet take 1 tablet by mouth once daily with OR IMMEDIETLY FOLLOWING A MEAL 90 tablet 3  . Multiple Vitamins-Minerals (MULTIVITAMIN WITH MINERALS) tablet Take 1 tablet by mouth daily.    . nitroGLYCERIN (NITROSTAT) 0.4 MG SL tablet Place  1 tablet (0.4 mg total) under the tongue every 5 (five) minutes as needed for chest pain (Do not give more than 3 SL tablets in 15 minutes.). 20 tablet 2  . pantoprazole (PROTONIX) 20 MG tablet Take 20 mg by mouth daily.     . ranolazine (RANEXA) 500 MG 12 hr tablet Take 1 tablet (500 mg total) by mouth 2 (two) times daily. 180 tablet 3  . ARIPiprazole (ABILIFY) 10 MG tablet Take 10 mg by mouth daily.      No current facility-administered medications for this visit.      Allergies:   Carbamazepine; Lithium; Acetaminophen; Codeine; Levofloxacin in d5w; Lodine [etodolac]; Methocarbamol; Oxycodone-acetaminophen; and Tape   Social History:  The patient  reports that she has been smoking cigarettes.  She has a 8.75 pack-year smoking history. she has never used  smokeless tobacco. She reports that she drinks alcohol. She reports that she uses drugs. Drug: Marijuana.   Family History:   family history includes Cirrhosis in her father; Lung cancer in her mother.    Review of Systems: Review of Systems  Constitutional: Negative.   Respiratory: Negative.   Cardiovascular: Positive for chest pain.  Gastrointestinal: Negative.        Belching  Neurological: Negative.   Psychiatric/Behavioral: Positive for depression. The patient is nervous/anxious.   All other systems reviewed and are negative.    PHYSICAL EXAM: VS:  BP 130/78 (BP Location: Left Arm, Patient Position: Sitting, Cuff Size: Normal)   Pulse 82   Ht '5\' 2"'$  (1.575 m)   Wt 208 lb 8 oz (94.6 kg)   BMI 38.14 kg/m  , BMI Body mass index is 38.14 kg/m. Constitutional:  oriented to person, place, and time. No distress.  HENT:  Head: Normocephalic and atraumatic.  Eyes:  no discharge. No scleral icterus.  Neck: Normal range of motion. Neck supple. No JVD present.  Cardiovascular: Normal rate, regular rhythm, normal heart sounds and intact distal pulses. Exam reveals no gallop and no friction rub. No edema No murmur heard. Pulmonary/Chest: Effort normal and breath sounds normal. No stridor. No respiratory distress.  no wheezes.  no rales.  no tenderness.  Abdominal: Soft.  no distension.  no tenderness.  Musculoskeletal: Normal range of motion.  no  tenderness or deformity.  Neurological:  normal muscle tone. Coordination normal. No atrophy Skin: Skin is warm and dry. No rash noted. not diaphoretic.  Psychiatric:  normal mood and affect. behavior is normal. Thought content normal.    Recent Labs: 11/20/2016: ALT 13 02/18/2017: BUN 21; Creatinine, Ser 0.70; Hemoglobin 14.3; Platelets 267; Potassium 4.1; Sodium 137    Lipid Panel Lab Results  Component Value Date   CHOL 140 08/02/2016   HDL 35 (L) 08/02/2016   LDLCALC 71 08/02/2016   TRIG 172 (H) 08/02/2016      Wt Readings  from Last 3 Encounters:  05/28/17 208 lb 8 oz (94.6 kg)  03/17/17 208 lb (94.3 kg)  02/19/17 205 lb (93 kg)      ASSESSMENT AND PLAN:  CAD in native artery - Plan: EKG 12-Lead We will stop the amlodipine, start Imdur 30 Scheduled for stress test next week Restart her PPI Recommended carbonated soda, Gas-X for gas problem with belching that seems to relieve her symptoms in the evening Nitro as needed for discomfort Recent cardiac catheterization 2 months ago with moderate disease, medical management recommended Smoking cessation recommended Previously noncompliant with aspirin, recommend she stay on her aspirin  Essential hypertension - Plan:  EKG 12-Lead Blood pressure is well controlled on today's visit.  Hold amlodipine, start Imdur  Chronic diastolic CHF (congestive heart failure) ([place]) - Plan: EKG 12-Lead Encouraged her to stay on her Lasix daily. Appears euvolemic Stable  Pain in both lower extremities Has chronic pain, takes medication  Mixed hyperlipidemia Encouraged her to stay on her Lipitor 40 mg daily Goal LDL less than 70  Poorly controlled type 2 diabetes mellitus with complication [facility]) She has missed several appointments with local doctors secondary to transportation issues .  Previously discussed with her at length concerning the importance of better diabetes control  Morbid obesity ([place]) We have encouraged continued exercise, careful diet management in an effort to lose weight.  Poor diet which she attributes to stress  Smoker /tobacco use   long discussion with her concerning her need to quit smoking  Reports she continues to smoke secondary to stress Does not want Chantix    Total encounter time more than 45 minutes  Greater than 50% was spent in counseling and coordination of care with the patient   Disposition:    Orders Placed This Encounter  Procedures  . EKG 12-Lead     Signed, Esmond Plants, M.D., Ph.D. 05/28/2017  [place], [place] ]

## 2017-05-27 NOTE — Telephone Encounter (Signed)
Pt c/o of Chest Pain: STAT if CP now or developed within 24 hours  1. Are you having CP right now? No   2. Are you experiencing any other symptoms (ex. SOB, nausea, vomiting, sweating)?  Nothing, chest pain then goes up her arm then to her jaw 3. How long have you been experiencing CP? For about 4-5 nights   4. Is your CP continuous or coming and going? Coming and going   5. Have you taken Nitroglycerin? Yes every night   Pt would like to know if she needs to go ED   Please advise  ?

## 2017-05-27 NOTE — Progress Notes (Signed)
Cardiology Office Note  Date:  05/28/2017   ID:  Jennifer [name], Jennifer [name] 05-Aug-1960, MRN [id number]  PCP:  Jeronimo Greaves, MD   Chief Complaint  Patient presents with  . Other    Patient c/o chest pain, left arm and jaw pain, and nausea mostly at night. Meds reviewed verbally with patient.     HPI:  Jennifer [name] is a 57 year old woman with long history of  Poorly controlled diabetes type 2, hemoglobin A1c 13 Active smoker coronary artery disease, stent to the mid RCA 2008, repeat stenting to the mid RCA 2012 ,   catheterization February 2016 showing severe distal RCA disease estimated at 90%, patent mid RCA stents, also with 70% mid to distal circumflex disease of a small vessel,  Catheterization April 2018 stent to the right Catheterization November 2018 medical management recommended presenting for routine follow-up of her coronary artery disease  Cardiac catheterization November 2018 Medical management recommended for moderate proximal RCA disease, left circumflex disease  Echocardiogram done November 2018 Normal LV function  cardiac catheterization April 2018 for unstable angina, minimal troponin elevation,  Procedure April 2018 with ISR of RCA stent proximal to mid Vessel PCI with resolute onyx 3.5 x 30 mm  In follow-up today she reports having worsening chest pain radiating up to her neck over the past week or so Typically happens at nighttime at rest On last office visit also reported having occasional chest pain Now she is taking nitroglycerin for symptoms Some symptoms on exertion over the past several days like in the morning Symptoms often resolved with a big belch Trouble receiving her Protonix prescription  Still smoking Has not seen primary care in follow-up for her diabetes  EKG personally reviewed by myself on todays visit Shows normal sinus rhythm with rate 82 bpm no significant ST or T wave changes   Other past medical history reviewed lost her 75-monthold  grandson, died in the crib She attempted rescue breaths  diabetes,  hemoglobin A1c greater than 13 started on insulin,  struggles to keep her sugars down  Despite her best efforts with diet, did not notice much improvement   Thyroid nodule, [facility]  Last stress test 03/19/2015  She reports having significant leg pain, unable to walk very far secondary to discomfort.   reports having chronic pain issues, sees the pain clinic   Cardiac catheterization February 2016   The left main coronary artery is a small vessel but widely patent. The left anterior descending artery,  In the ostium of the lower pole, there is a moderate stenosis. The left circumflex artery AV groove circumflex has a focal 70% stenosis. This vessel is quite small, likely less than 2 mm in diameter. The right coronary artery stents in the mid right coronary artery appear widely patent. There is mild disease proximally. In the distal RCA, moderate disease followed by a focal severe, 90% stenosis.  A 2.5 x 27 drug-eluting stent was deployed. postdilated with a 3.0 x 15 balloon.    PMH:   has a past medical history of Anxiety, Arthritis, Bipolar disorder ([place], CAD (coronary artery disease), Chronic bronchitis ([place], Daily headache, Depression, Diastolic dysfunction, GERD (gastroesophageal reflux disease), Heart murmur, High cholesterol, History of stomach ulcers, Hypertension, Insomnia, Kidney stones, Migraine, Myocardial infarction ([place] (2008; ~ 2012), Pneumonia, RLS (restless legs syndrome), Seizures ([place], Sleep apnea, Stroke ([place], Tobacco abuse, Type II diabetes mellitus ([place], and Urinary, incontinence, stress female.  PSH:    Past Surgical  History:  Procedure Laterality Date  . ABDOMINAL HYSTERECTOMY  1984   "I've got 1 ovary left"  . CARDIAC CATHETERIZATION N/A 02/26/2015   Procedure: Left Heart Cath and Coronary Angiography;  Surgeon: Wellington Hampshire, MD;  Location: [place] [facility];  Service: Cardiovascular;  Laterality: N/A;  . CARDIAC CATHETERIZATION N/A 02/26/2015   Procedure: Coronary Stent Intervention;  Surgeon: Wellington Hampshire, MD;  Location: [place] [facility];  Service: Cardiovascular;  Laterality: N/A;  . CESAREAN SECTION  1984  . CORONARY ANGIOPLASTY WITH STENT PLACEMENT  2008; ~ 2012   "1; 1"  . CORONARY STENT INTERVENTION N/A 08/04/2016   Procedure: Coronary Stent Intervention;  Surgeon: Wellington Hampshire, MD;  Location: [place] [facility];  Service: Cardiovascular;  Laterality: N/A;  . DILATION AND CURETTAGE OF UTERUS    . EXTRACORPOREAL SHOCK WAVE LITHOTRIPSY  X 2  . KNEE ARTHROSCOPY Left   . LEFT HEART CATH Bilateral 08/04/2016   Procedure: Left Heart Cath poss PCI;  Surgeon: Wellington Hampshire, MD;  Location: [place] [facility];  Service: Cardiovascular;  Laterality: Bilateral;  . LEFT HEART CATH AND CORONARY ANGIOGRAPHY Left 02/19/2017   Procedure: LEFT HEART CATH AND CORONARY ANGIOGRAPHY;  Surgeon: Minna Merritts, MD;  Location: [place] [facility];  Service: Cardiovascular;  Laterality: Left;  . LEFT HEART CATHETERIZATION WITH CORONARY ANGIOGRAM N/A 06/15/2014   Procedure: LEFT HEART CATHETERIZATION WITH CORONARY ANGIOGRAM;  Surgeon: Jettie Booze, MD;  Location[facility] LAB;  Service: Cardiovascular;  Laterality: N/A;  . PERCUTANEOUS CORONARY STENT INTERVENTION (PCI-S)  06/15/2014   Procedure: PERCUTANEOUS CORONARY STENT INTERVENTION (PCI-S);  Surgeon: Jettie Booze, MD;  Location: [facility] CATH LAB;  Service: Cardiovascular;;  . TONSILLECTOMY    . TUBAL LIGATION      Current Outpatient Medications  Medication Sig Dispense Refill  . albuterol (PROVENTIL HFA;VENTOLIN HFA) 108 (90 Base) MCG/ACT inhaler Inhale 1-2 puffs into the lungs every 4 (four) hours as needed.     [name] amLODipine (NORVASC) 5 MG tablet Take 1 tablet (5 mg total) by mouth daily. 90 tablet 3  . aspirin EC 81 MG EC tablet Take 1 tablet (81 mg total) by mouth  daily. 30 tablet 2  . atorvastatin (LIPITOR) 40 MG tablet take 1 tablet by mouth once daily as directed 90 tablet 3  . Blood Glucose Monitoring Suppl W/DEVICE KIT Test 1-2 times daily; E11.65 diagnosis 1 each 0  . clopidogrel (PLAVIX) 75 MG tablet take 1 tablet by mouth once daily WITH BREAKFAST 90 tablet 3  . cyclobenzaprine (FLEXERIL) 10 MG tablet Take 1 tablet by mouth 3 (three) times daily.    . furosemide (LASIX) 20 MG tablet take 1 tablet by mouth once daily 30 tablet 6  . gabapentin (NEURONTIN) 600 MG tablet take 1.5 tablets by mouth four times a day  0  . Insulin Glargine (BASAGLAR KWIKPEN) 100 UNIT/ML SOPN Inject 50 units at night and 20 units in the morning.    . liraglutide (VICTOZA) 18 MG/3ML SOPN Victoza 3-Pak 0.6 mg/0.1 mL (18 mg/3 mL) subcutaneous pen injector    . lisinopril (PRINIVIL,ZESTRIL) 40 MG tablet take 1 tablet by mouth once daily 90 tablet 3  . metoprolol (TOPROL-XL) 200 MG 24 hr tablet take 1 tablet by mouth once daily with OR IMMEDIETLY FOLLOWING A MEAL 90 tablet 3  . Multiple Vitamins-Minerals (MULTIVITAMIN WITH MINERALS) tablet Take 1 tablet by mouth daily.    . nitroGLYCERIN (NITROSTAT) 0.4 MG SL tablet Place  1 tablet (0.4 mg total) under the tongue every 5 (five) minutes as needed for chest pain (Do not give more than 3 SL tablets in 15 minutes.). 20 tablet 2  . pantoprazole (PROTONIX) 20 MG tablet Take 20 mg by mouth daily.     . ranolazine (RANEXA) 500 MG 12 hr tablet Take 1 tablet (500 mg total) by mouth 2 (two) times daily. 180 tablet 3  . ARIPiprazole (ABILIFY) 10 MG tablet Take 10 mg by mouth daily.      No current facility-administered medications for this visit.      Allergies:   Carbamazepine; Lithium; Acetaminophen; Codeine; Levofloxacin in d5w; Lodine [etodolac]; Methocarbamol; Oxycodone-acetaminophen; and Tape   Social History:  The patient  reports that she has been smoking cigarettes.  She has a 8.75 pack-year smoking history. she has never used  smokeless tobacco. She reports that she drinks alcohol. She reports that she uses drugs. Drug: Marijuana.   Family History:   family history includes Cirrhosis in her father; Lung cancer in her mother.    Review of Systems: Review of Systems  Constitutional: Negative.   Respiratory: Negative.   Cardiovascular: Positive for chest pain.  Gastrointestinal: Negative.        Belching  Neurological: Negative.   Psychiatric/Behavioral: Positive for depression. The patient is nervous/anxious.   All other systems reviewed and are negative.    PHYSICAL EXAM: VS:  BP 130/78 (BP Location: Left Arm, Patient Position: Sitting, Cuff Size: Normal)   Pulse 82   Ht '5\' 2"'$  (1.575 m)   Wt 208 lb 8 oz (94.6 kg)   BMI 38.14 kg/m  , BMI Body mass index is 38.14 kg/m. Constitutional:  oriented to person, place, and time. No distress.  HENT:  Head: Normocephalic and atraumatic.  Eyes:  no discharge. No scleral icterus.  Neck: Normal range of motion. Neck supple. No JVD present.  Cardiovascular: Normal rate, regular rhythm, normal heart sounds and intact distal pulses. Exam reveals no gallop and no friction rub. No edema No murmur heard. Pulmonary/Chest: Effort normal and breath sounds normal. No stridor. No respiratory distress.  no wheezes.  no rales.  no tenderness.  Abdominal: Soft.  no distension.  no tenderness.  Musculoskeletal: Normal range of motion.  no  tenderness or deformity.  Neurological:  normal muscle tone. Coordination normal. No atrophy Skin: Skin is warm and dry. No rash noted. not diaphoretic.  Psychiatric:  normal mood and affect. behavior is normal. Thought content normal.    Recent Labs: 11/20/2016: ALT 13 02/18/2017: BUN 21; Creatinine, Ser 0.70; Hemoglobin 14.3; Platelets 267; Potassium 4.1; Sodium 137    Lipid Panel Lab Results  Component Value Date   CHOL 140 08/02/2016   HDL 35 (L) 08/02/2016   LDLCALC 71 08/02/2016   TRIG 172 (H) 08/02/2016      Wt Readings  from Last 3 Encounters:  05/28/17 208 lb 8 oz (94.6 kg)  03/17/17 208 lb (94.3 kg)  02/19/17 205 lb (93 kg)      ASSESSMENT AND PLAN:  CAD in native artery - Plan: EKG 12-Lead We will stop the amlodipine, start Imdur 30 Scheduled for stress test next week Restart her PPI Recommended carbonated soda, Gas-X for gas problem with belching that seems to relieve her symptoms in the evening Nitro as needed for discomfort Recent cardiac catheterization 2 months ago with moderate disease, medical management recommended Smoking cessation recommended Previously noncompliant with aspirin, recommend she stay on her aspirin  Essential hypertension - Plan:  EKG 12-Lead Blood pressure is well controlled on today's visit.  Hold amlodipine, start Imdur  Chronic diastolic CHF (congestive heart failure) ([place]) - Plan: EKG 12-Lead Encouraged her to stay on her Lasix daily. Appears euvolemic Stable  Pain in both lower extremities Has chronic pain, takes medication  Mixed hyperlipidemia Encouraged her to stay on her Lipitor 40 mg daily Goal LDL less than 70  Poorly controlled type 2 diabetes mellitus with complication [facility]) She has missed several appointments with local doctors secondary to transportation issues .  Previously discussed with her at length concerning the importance of better diabetes control  Morbid obesity ([place]) We have encouraged continued exercise, careful diet management in an effort to lose weight.  Poor diet which she attributes to stress  Smoker /tobacco use   long discussion with her concerning her need to quit smoking  Reports she continues to smoke secondary to stress Does not want Chantix    Total encounter time more than 45 minutes  Greater than 50% was spent in counseling and coordination of care with the patient   Disposition:    Orders Placed This Encounter  Procedures  . EKG 12-Lead     Signed, Esmond Plants, M.D., Ph.D. 05/28/2017  [place], [place] ]

## 2017-05-27 NOTE — Telephone Encounter (Signed)
Pt w/history of CAD with multiple stents c/o "symptoms of a heart attack" every night for the past 4-5 nights. Stays up most of the night with 8 out of 10 pain in chest, left arm and jaw, nausea and is diaphoretic. Sx last 1-3 minutes and is relieved with nitro x 2. Pain typically returns within an hour. Unsure of BP. She is currently chest pain free; last episode 3am-4am today. Currently smokes 4-5 cigarettes a day. Cath in November 2018 showed patency of stents; no intervention needed at that time.  She takes Ranexa 500mg  BID. Would like advice on what she should do. Reviewed with Dr. Mariah MillingGollan who is agreeable to appt tomorrow.   S/w pt who is agreeable. She understands if sx return and/or worsen, she may go to the ED for evaluation. Added to schedule.

## 2017-05-28 ENCOUNTER — Ambulatory Visit (INDEPENDENT_AMBULATORY_CARE_PROVIDER_SITE_OTHER): Payer: Medicare Other | Admitting: Cardiovascular Disease

## 2017-05-28 ENCOUNTER — Encounter: Payer: Self-pay | Admitting: Cardiovascular Disease

## 2017-05-28 VITALS — BP 130/78 | HR 82 | Ht 62.0 in | Wt 208.5 lb

## 2017-05-28 DIAGNOSIS — E1165 Type 2 diabetes mellitus with hyperglycemia: Secondary | ICD-10-CM | POA: Diagnosis not present

## 2017-05-28 DIAGNOSIS — R079 Chest pain, unspecified: Secondary | ICD-10-CM | POA: Diagnosis not present

## 2017-05-28 DIAGNOSIS — I25110 Atherosclerotic heart disease of native coronary artery with unstable angina pectoris: Secondary | ICD-10-CM

## 2017-05-28 DIAGNOSIS — I200 Unstable angina: Secondary | ICD-10-CM | POA: Diagnosis not present

## 2017-05-28 DIAGNOSIS — Z720 Tobacco use: Secondary | ICD-10-CM

## 2017-05-28 DIAGNOSIS — I739 Peripheral vascular disease, unspecified: Secondary | ICD-10-CM | POA: Diagnosis not present

## 2017-05-28 DIAGNOSIS — E118 Type 2 diabetes mellitus with unspecified complications: Secondary | ICD-10-CM | POA: Diagnosis not present

## 2017-05-28 DIAGNOSIS — I5032 Chronic diastolic (congestive) heart failure: Secondary | ICD-10-CM

## 2017-05-28 MED ORDER — PANTOPRAZOLE SODIUM 40 MG PO TBEC: 40.0000 mg | DELAYED_RELEASE_TABLET | Freq: Every day | ORAL | 6 refills | Status: DC

## 2017-05-28 MED ORDER — ISOSORBIDE MONONITRATE ER 30 MG PO TB24: 30.0000 mg | ORAL_TABLET | Freq: Every day | ORAL | 6 refills | Status: DC

## 2017-05-28 NOTE — Patient Instructions (Addendum)
Medication Instructions:   Please stop the amlodipine Start isosorbide/imdur one a day  Coke or gas-X Start protonix one a day  Labwork:  No new labs needed  Testing/Procedures:  Stress test, lexiscan myoview for angina pain Hold imdur and metoprolol the night before the test and morning of the test [facility] MYOVIEW  Your caregiver has ordered a Stress Test with nuclear imaging. The purpose of this test is to evaluate the blood supply to your heart muscle. This procedure is referred to as a "Non-Invasive Stress Test." This is because other than having an IV started in your vein, nothing is inserted or "invades" your body. Cardiac stress tests are done to find areas of poor blood flow to the heart by determining the extent of coronary artery disease (CAD). Some patients exercise on a treadmill, which naturally increases the blood flow to your heart, while others who are  unable to walk on a treadmill due to physical limitations have a pharmacologic/chemical stress agent called Lexiscan . This medicine will mimic walking on a treadmill by temporarily increasing your coronary blood flow.   Please note: these test may take anywhere between 2-4 hours to complete  PLEASE REPORT TO [facility]  THE VOLUNTEERS AT THE FIRST DESK WILL DIRECT YOU WHERE TO GO  Date of Procedure:__Thursday February 14th______  Arrival Time for Procedure:___08:45AM__________  Instructions regarding medication:   _X___ : Hold diabetes medication morning of procedure  _X___:  Hold isosorbide mononitrate and metoprolol the night before procedure and morning of procedure   PLEASE NOTIFY THE OFFICE AT LEAST 24 HOURS IN ADVANCE IF YOU ARE UNABLE TO KEEP YOUR APPOINTMENT.  [phone] AND  PLEASE NOTIFY NUCLEAR MEDICINE AT [facility] AT LEAST 24 HOURS IN ADVANCE IF YOU ARE UNABLE TO KEEP YOUR APPOINTMENT. [phone]  How to prepare for your Myoview test:  1. Do not eat or drink after midnight 2. No  caffeine for 24 hours prior to test 3. No smoking 24 hours prior to test. 4. Your medication may be taken with water.  If your doctor stopped a medication because of this test, do not take that medication. 5. Ladies, please do not wear dresses.  Skirts or pants are appropriate. Please wear a short sleeve shirt. 6. No perfume, cologne or lotion. 7. Wear comfortable walking shoes. No heels!    Follow-Up: It was a pleasure seeing you in the office today. Please call us if you have new issues that need to be addressed before your next appt.  [phone]   We will call you with the results of your stress test  If you need a refill on your cardiac medications before your next appointment, please call your pharmacy.

## 2017-06-04 ENCOUNTER — Encounter
Admission: RE | Admit: 2017-06-04 | Discharge: 2017-06-04 | Disposition: A | Discharge status: Home / Self Care | Payer: Medicare Other | Source: Ambulatory Visit | Attending: Cardiovascular Disease | Admitting: Cardiovascular Disease

## 2017-06-04 ENCOUNTER — Other Ambulatory Visit: Payer: Self-pay | Admitting: Physician Assistant

## 2017-06-04 DIAGNOSIS — R079 Chest pain, unspecified: Secondary | ICD-10-CM

## 2017-06-04 LAB — NM MYOCAR MULTI W/SPECT W/WALL MOTION / EF
CSEPHR: 59 %
LV sys vol: 37 mL
LVDIAVOL: 85 mL (ref 46–106)
NUC STRESS TID: 0.95
Peak HR: 99 {beats}/min
Rest HR: 80 {beats}/min
SDS: 6
SRS: 6
SSS: 12

## 2017-06-04 MED ORDER — TECHNETIUM TC 99M TETROFOSMIN IV KIT
32.9700 | PACK | Freq: Once | INTRAVENOUS | Status: AC | PRN
  Administered 2017-06-04: 32.97 via INTRAVENOUS

## 2017-06-04 MED ORDER — REGADENOSON 0.4 MG/5ML IV SOLN
0.4000 mg | Freq: Once | INTRAVENOUS | Status: AC
  Administered 2017-06-04: 0.4 mg via INTRAVENOUS
  Filled 2017-06-04: qty 5

## 2017-06-04 MED ORDER — TECHNETIUM TC 99M TETROFOSMIN IV KIT
13.6200 | PACK | Freq: Once | INTRAVENOUS | Status: AC | PRN
  Administered 2017-06-04: 13.62 via INTRAVENOUS

## 2017-06-04 MED ORDER — AMINOPHYLLINE 25 MG/ML IV (NUC MED)
75.0000 mg | Freq: Once | INTRAVENOUS | Status: AC
  Administered 2017-06-04: 75 mg via INTRAVENOUS

## 2017-06-05 ENCOUNTER — Other Ambulatory Visit: Payer: Self-pay | Admitting: Cardiovascular Disease

## 2017-06-05 ENCOUNTER — Telehealth: Payer: Self-pay | Admitting: *Deleted

## 2017-06-05 DIAGNOSIS — I200 Unstable angina: Secondary | ICD-10-CM

## 2017-06-05 DIAGNOSIS — Z01812 Encounter for preprocedural laboratory examination: Secondary | ICD-10-CM

## 2017-06-05 NOTE — Telephone Encounter (Signed)
-----   Message from Antonieta Ibaimothy J Gollan, MD sent at 06/04/2017  8:11 PM EST ----- Can we see if Dr. Kirke CorinArida can perform cardiac catheterization on Monday, February 18 Symptoms were relatively severe, do not want to wait very long Large region of ischemia RCA territory No disease, continues to smoke Previous PCIs

## 2017-06-05 NOTE — Telephone Encounter (Signed)
Spoke with patient and reviewed results and recommendations. Scheduled her to have left heart cath on Tuesday February 19th at 08:30AM with arrival time of 07:30AM at [facility]. Reviewed all instructions for procedure along with requirement for labs prior to procedure day. She verbalized understanding of our conversation, agreement with plan, and had no further questions at this time.

## 2017-06-07 NOTE — Telephone Encounter (Signed)
Cath scheduled if you are available on Tuesday Stress test with positive inferior wall ischemia, have unstable angina sx. Sx were bad, tried for Monday but Kirke Corinarida was busy. Thanks if you are available TG

## 2017-06-08 ENCOUNTER — Other Ambulatory Visit
Admission: RE | Admit: 2017-06-08 | Discharge: 2017-06-08 | Disposition: A | Discharge status: Home / Self Care | Payer: Medicare Other | Source: Ambulatory Visit | Attending: Cardiovascular Disease | Admitting: Cardiovascular Disease

## 2017-06-08 DIAGNOSIS — Z01812 Encounter for preprocedural laboratory examination: Secondary | ICD-10-CM | POA: Insufficient documentation

## 2017-06-08 LAB — CBC WITH DIFFERENTIAL/PLATELET
Basophils Absolute: 0.1 10*3/uL (ref 0–0.1)
Basophils Relative: 1 %
EOS PCT: 3 %
Eosinophils Absolute: 0.2 10*3/uL (ref 0–0.7)
HCT: 36.8 % (ref 35.0–47.0)
Hemoglobin: 12.3 g/dL (ref 12.0–16.0)
LYMPHS PCT: 27 %
Lymphs Abs: 2.3 10*3/uL (ref 1.0–3.6)
MCH: 31.5 pg (ref 26.0–34.0)
MCHC: 33.3 g/dL (ref 32.0–36.0)
MCV: 94.5 fL (ref 80.0–100.0)
Monocytes Absolute: 0.4 10*3/uL (ref 0.2–0.9)
Monocytes Relative: 4 %
NEUTROS ABS: 5.7 10*3/uL (ref 1.4–6.5)
NEUTROS PCT: 65 %
PLATELETS: 290 10*3/uL (ref 150–440)
RBC: 3.89 MIL/uL (ref 3.80–5.20)
RDW: 15.1 % — ABNORMAL HIGH (ref 11.5–14.5)
WBC: 8.6 10*3/uL (ref 3.6–11.0)

## 2017-06-08 LAB — BASIC METABOLIC PANEL
ANION GAP: 8 (ref 5–15)
BUN: 18 mg/dL (ref 6–20)
CO2: 26 mmol/L (ref 22–32)
Calcium: 8.8 mg/dL — ABNORMAL LOW (ref 8.9–10.3)
Chloride: 110 mmol/L (ref 101–111)
Creatinine, Ser: 0.89 mg/dL (ref 0.44–1.00)
Glucose, Bld: 133 mg/dL — ABNORMAL HIGH (ref 65–99)
POTASSIUM: 4 mmol/L (ref 3.5–5.1)
SODIUM: 144 mmol/L (ref 135–145)

## 2017-06-08 LAB — PROTIME-INR
INR: 0.88
Prothrombin Time: 11.9 seconds (ref 11.4–15.2)

## 2017-06-09 ENCOUNTER — Other Ambulatory Visit: Payer: Self-pay

## 2017-06-09 ENCOUNTER — Encounter
Admission: RE | Disposition: A | Discharge status: Home / Self Care | Payer: Self-pay | Source: Ambulatory Visit | Attending: Internal Medicine

## 2017-06-09 ENCOUNTER — Ambulatory Visit
Admission: RE | Admit: 2017-06-09 | Discharge: 2017-06-10 | Disposition: A | Discharge status: Home / Self Care | Payer: Medicare Other | Source: Ambulatory Visit | Attending: Internal Medicine | Admitting: Internal Medicine

## 2017-06-09 ENCOUNTER — Encounter: Payer: Self-pay | Admitting: *Deleted

## 2017-06-09 DIAGNOSIS — Y831 Surgical operation with implant of artificial internal device as the cause of abnormal reaction of the patient, or of later complication, without mention of misadventure at the time of the procedure: Secondary | ICD-10-CM | POA: Insufficient documentation

## 2017-06-09 DIAGNOSIS — Z6838 Body mass index (BMI) 38.0-38.9, adult: Secondary | ICD-10-CM | POA: Diagnosis not present

## 2017-06-09 DIAGNOSIS — Z23 Encounter for immunization: Secondary | ICD-10-CM | POA: Insufficient documentation

## 2017-06-09 DIAGNOSIS — I252 Old myocardial infarction: Secondary | ICD-10-CM | POA: Insufficient documentation

## 2017-06-09 DIAGNOSIS — E118 Type 2 diabetes mellitus with unspecified complications: Secondary | ICD-10-CM

## 2017-06-09 DIAGNOSIS — Z7902 Long term (current) use of antithrombotics/antiplatelets: Secondary | ICD-10-CM | POA: Insufficient documentation

## 2017-06-09 DIAGNOSIS — I5032 Chronic diastolic (congestive) heart failure: Secondary | ICD-10-CM | POA: Diagnosis not present

## 2017-06-09 DIAGNOSIS — Z8673 Personal history of transient ischemic attack (TIA), and cerebral infarction without residual deficits: Secondary | ICD-10-CM | POA: Insufficient documentation

## 2017-06-09 DIAGNOSIS — G43909 Migraine, unspecified, not intractable, without status migrainosus: Secondary | ICD-10-CM | POA: Diagnosis not present

## 2017-06-09 DIAGNOSIS — M79605 Pain in left leg: Secondary | ICD-10-CM | POA: Insufficient documentation

## 2017-06-09 DIAGNOSIS — F419 Anxiety disorder, unspecified: Secondary | ICD-10-CM | POA: Insufficient documentation

## 2017-06-09 DIAGNOSIS — Z885 Allergy status to narcotic agent status: Secondary | ICD-10-CM | POA: Insufficient documentation

## 2017-06-09 DIAGNOSIS — E1165 Type 2 diabetes mellitus with hyperglycemia: Secondary | ICD-10-CM | POA: Insufficient documentation

## 2017-06-09 DIAGNOSIS — K219 Gastro-esophageal reflux disease without esophagitis: Secondary | ICD-10-CM | POA: Diagnosis not present

## 2017-06-09 DIAGNOSIS — F17210 Nicotine dependence, cigarettes, uncomplicated: Secondary | ICD-10-CM | POA: Insufficient documentation

## 2017-06-09 DIAGNOSIS — J42 Unspecified chronic bronchitis: Secondary | ICD-10-CM | POA: Insufficient documentation

## 2017-06-09 DIAGNOSIS — E782 Mixed hyperlipidemia: Secondary | ICD-10-CM | POA: Diagnosis not present

## 2017-06-09 DIAGNOSIS — I110 Hypertensive heart disease with heart failure: Secondary | ICD-10-CM | POA: Insufficient documentation

## 2017-06-09 DIAGNOSIS — I10 Essential (primary) hypertension: Secondary | ICD-10-CM | POA: Diagnosis present

## 2017-06-09 DIAGNOSIS — I200 Unstable angina: Secondary | ICD-10-CM | POA: Diagnosis present

## 2017-06-09 DIAGNOSIS — Z7982 Long term (current) use of aspirin: Secondary | ICD-10-CM | POA: Insufficient documentation

## 2017-06-09 DIAGNOSIS — G8929 Other chronic pain: Secondary | ICD-10-CM | POA: Diagnosis not present

## 2017-06-09 DIAGNOSIS — M79604 Pain in right leg: Secondary | ICD-10-CM | POA: Diagnosis not present

## 2017-06-09 DIAGNOSIS — I25110 Atherosclerotic heart disease of native coronary artery with unstable angina pectoris: Secondary | ICD-10-CM | POA: Diagnosis not present

## 2017-06-09 DIAGNOSIS — G4730 Sleep apnea, unspecified: Secondary | ICD-10-CM | POA: Insufficient documentation

## 2017-06-09 DIAGNOSIS — R569 Unspecified convulsions: Secondary | ICD-10-CM | POA: Diagnosis not present

## 2017-06-09 DIAGNOSIS — T82855A Stenosis of coronary artery stent, initial encounter: Secondary | ICD-10-CM | POA: Insufficient documentation

## 2017-06-09 DIAGNOSIS — F319 Bipolar disorder, unspecified: Secondary | ICD-10-CM | POA: Diagnosis not present

## 2017-06-09 DIAGNOSIS — Z955 Presence of coronary angioplasty implant and graft: Secondary | ICD-10-CM | POA: Insufficient documentation

## 2017-06-09 DIAGNOSIS — Z720 Tobacco use: Secondary | ICD-10-CM | POA: Diagnosis present

## 2017-06-09 DIAGNOSIS — G2581 Restless legs syndrome: Secondary | ICD-10-CM | POA: Diagnosis not present

## 2017-06-09 DIAGNOSIS — I2510 Atherosclerotic heart disease of native coronary artery without angina pectoris: Secondary | ICD-10-CM | POA: Diagnosis present

## 2017-06-09 DIAGNOSIS — R9439 Abnormal result of other cardiovascular function study: Secondary | ICD-10-CM | POA: Diagnosis present

## 2017-06-09 HISTORY — PX: LEFT HEART CATH AND CORONARY ANGIOGRAPHY: CATH118249

## 2017-06-09 HISTORY — PX: CORONARY BALLOON ANGIOPLASTY: CATH118233

## 2017-06-09 LAB — GLUCOSE, CAPILLARY
GLUCOSE-CAPILLARY: 84 mg/dL (ref 65–99)
Glucose-Capillary: 185 mg/dL — ABNORMAL HIGH (ref 65–99)
Glucose-Capillary: 99 mg/dL (ref 65–99)
Glucose-Capillary: 120 mg/dL — ABNORMAL HIGH (ref 65–99)

## 2017-06-09 LAB — POCT ACTIVATED CLOTTING TIME: ACTIVATED CLOTTING TIME: 296 s

## 2017-06-09 SURGERY — LEFT HEART CATH AND CORONARY ANGIOGRAPHY
Anesthesia: Moderate Sedation

## 2017-06-09 SURGERY — LEFT HEART CATH
Anesthesia: Moderate Sedation

## 2017-06-09 MED ORDER — LISINOPRIL 20 MG PO TABS
40.0000 mg | ORAL_TABLET | Freq: Every day | ORAL | Status: DC
  Administered 2017-06-09 – 2017-06-10 (×2): 40 mg via ORAL
  Filled 2017-06-09 (×3): qty 2

## 2017-06-09 MED ORDER — ASPIRIN 81 MG PO CHEW
CHEWABLE_TABLET | ORAL | Status: AC
  Filled 2017-06-09: qty 1

## 2017-06-09 MED ORDER — VERAPAMIL HCL 2.5 MG/ML IV SOLN
INTRAVENOUS | Status: AC
  Filled 2017-06-09: qty 2

## 2017-06-09 MED ORDER — HEPARIN SODIUM (PORCINE) 1000 UNIT/ML IJ SOLN
INTRAMUSCULAR | Status: AC
  Filled 2017-06-09: qty 1

## 2017-06-09 MED ORDER — VERAPAMIL HCL 2.5 MG/ML IV SOLN
INTRAVENOUS | Status: DC | PRN
  Administered 2017-06-09: 2.5 mg via INTRA_ARTERIAL

## 2017-06-09 MED ORDER — HYDRALAZINE HCL 20 MG/ML IJ SOLN: 5.0000 mg | INTRAMUSCULAR | Status: AC | PRN

## 2017-06-09 MED ORDER — ASPIRIN 81 MG PO CHEW
81.0000 mg | CHEWABLE_TABLET | ORAL | Status: AC
  Administered 2017-06-09: 81 mg via ORAL

## 2017-06-09 MED ORDER — GABAPENTIN 600 MG PO TABS
900.0000 mg | ORAL_TABLET | Freq: Four times a day (QID) | ORAL | Status: DC
  Administered 2017-06-09 – 2017-06-10 (×4): 900 mg via ORAL
  Filled 2017-06-09 (×4): qty 2

## 2017-06-09 MED ORDER — SODIUM CHLORIDE 0.9% FLUSH
3.0000 mL | Freq: Two times a day (BID) | INTRAVENOUS | Status: DC
  Administered 2017-06-09 – 2017-06-10 (×3): 3 mL via INTRAVENOUS

## 2017-06-09 MED ORDER — FENTANYL CITRATE (PF) 100 MCG/2ML IJ SOLN
INTRAMUSCULAR | Status: DC | PRN
  Administered 2017-06-09: 50 ug via INTRAVENOUS

## 2017-06-09 MED ORDER — METOPROLOL SUCCINATE ER 100 MG PO TB24
200.0000 mg | ORAL_TABLET | Freq: Every day | ORAL | Status: DC
  Administered 2017-06-09 – 2017-06-10 (×2): 200 mg via ORAL
  Filled 2017-06-09: qty 2

## 2017-06-09 MED ORDER — ONDANSETRON HCL 4 MG/2ML IJ SOLN: 4.0000 mg | Freq: Four times a day (QID) | INTRAMUSCULAR | Status: DC | PRN

## 2017-06-09 MED ORDER — SODIUM CHLORIDE 0.9 % IV SOLN: INTRAVENOUS | Status: AC

## 2017-06-09 MED ORDER — SODIUM CHLORIDE 0.9% FLUSH: 3.0000 mL | Freq: Two times a day (BID) | INTRAVENOUS | Status: DC

## 2017-06-09 MED ORDER — INSULIN ASPART 100 UNIT/ML ~~LOC~~ SOLN
SUBCUTANEOUS | Status: AC
  Filled 2017-06-09: qty 1

## 2017-06-09 MED ORDER — INSULIN ASPART 100 UNIT/ML ~~LOC~~ SOLN
0.0000 [IU] | Freq: Three times a day (TID) | SUBCUTANEOUS | Status: DC
  Administered 2017-06-09: 3 [IU] via SUBCUTANEOUS
  Administered 2017-06-10: 2 [IU] via SUBCUTANEOUS
  Filled 2017-06-09: qty 1

## 2017-06-09 MED ORDER — METOPROLOL SUCCINATE ER 50 MG PO TB24
ORAL_TABLET | ORAL | Status: AC
  Filled 2017-06-09: qty 4

## 2017-06-09 MED ORDER — INFLUENZA VAC SPLIT HIGH-DOSE 0.5 ML IM SUSY: 0.5000 mL | PREFILLED_SYRINGE | INTRAMUSCULAR | Status: DC

## 2017-06-09 MED ORDER — RANOLAZINE ER 500 MG PO TB12
500.0000 mg | ORAL_TABLET | Freq: Two times a day (BID) | ORAL | Status: DC
  Administered 2017-06-09 – 2017-06-10 (×3): 500 mg via ORAL
  Filled 2017-06-09 (×4): qty 1

## 2017-06-09 MED ORDER — CLOPIDOGREL BISULFATE 75 MG PO TABS
ORAL_TABLET | ORAL | Status: DC | PRN
  Administered 2017-06-09: 300 mg via ORAL

## 2017-06-09 MED ORDER — ALUM & MAG HYDROXIDE-SIMETH 200-200-20 MG/5ML PO SUSP: 30.0000 mL | ORAL | Status: DC | PRN

## 2017-06-09 MED ORDER — ACETAMINOPHEN 325 MG PO TABS: 650.0000 mg | ORAL_TABLET | ORAL | Status: DC | PRN

## 2017-06-09 MED ORDER — FENTANYL CITRATE (PF) 100 MCG/2ML IJ SOLN
INTRAMUSCULAR | Status: AC
  Filled 2017-06-09: qty 2

## 2017-06-09 MED ORDER — NITROGLYCERIN 5 MG/ML IV SOLN
INTRAVENOUS | Status: AC
  Filled 2017-06-09: qty 10

## 2017-06-09 MED ORDER — HEPARIN SODIUM (PORCINE) 1000 UNIT/ML IJ SOLN
INTRAMUSCULAR | Status: DC | PRN
  Administered 2017-06-09 (×2): 5000 [IU] via INTRAVENOUS

## 2017-06-09 MED ORDER — ATORVASTATIN CALCIUM 20 MG PO TABS
40.0000 mg | ORAL_TABLET | Freq: Every day | ORAL | Status: DC
  Administered 2017-06-09: 40 mg via ORAL
  Filled 2017-06-09: qty 2

## 2017-06-09 MED ORDER — IOPAMIDOL (ISOVUE-300) INJECTION 61%
INTRAVENOUS | Status: DC | PRN
  Administered 2017-06-09: 110 mL via INTRA_ARTERIAL

## 2017-06-09 MED ORDER — CYCLOBENZAPRINE HCL 10 MG PO TABS
10.0000 mg | ORAL_TABLET | Freq: Three times a day (TID) | ORAL | Status: DC | PRN
  Administered 2017-06-09: 10 mg via ORAL
  Filled 2017-06-09: qty 1

## 2017-06-09 MED ORDER — HEPARIN SODIUM (PORCINE) 5000 UNIT/ML IJ SOLN
5000.0000 [IU] | Freq: Three times a day (TID) | INTRAMUSCULAR | Status: DC
  Administered 2017-06-09 – 2017-06-10 (×2): 5000 [IU] via SUBCUTANEOUS
  Filled 2017-06-09 (×2): qty 1

## 2017-06-09 MED ORDER — CLOPIDOGREL BISULFATE 75 MG PO TABS
ORAL_TABLET | ORAL | Status: AC
  Filled 2017-06-09: qty 4

## 2017-06-09 MED ORDER — INSULIN GLARGINE 100 UNIT/ML ~~LOC~~ SOLN
20.0000 [IU] | Freq: Every day | SUBCUTANEOUS | Status: DC
  Administered 2017-06-10: 20 [IU] via SUBCUTANEOUS
  Filled 2017-06-09: qty 0.2

## 2017-06-09 MED ORDER — IBUPROFEN 400 MG PO TABS
400.0000 mg | ORAL_TABLET | Freq: Four times a day (QID) | ORAL | Status: DC | PRN
  Administered 2017-06-09 – 2017-06-10 (×3): 400 mg via ORAL
  Filled 2017-06-09 (×4): qty 1

## 2017-06-09 MED ORDER — INSULIN GLARGINE 100 UNIT/ML ~~LOC~~ SOLN
50.0000 [IU] | Freq: Every day | SUBCUTANEOUS | Status: DC
  Administered 2017-06-09: 50 [IU] via SUBCUTANEOUS
  Filled 2017-06-09 (×2): qty 0.5

## 2017-06-09 MED ORDER — NITROGLYCERIN 0.4 MG SL SUBL: 0.4000 mg | SUBLINGUAL_TABLET | SUBLINGUAL | Status: DC | PRN

## 2017-06-09 MED ORDER — ALBUTEROL SULFATE (2.5 MG/3ML) 0.083% IN NEBU: 3.0000 mL | INHALATION_SOLUTION | RESPIRATORY_TRACT | Status: DC | PRN

## 2017-06-09 MED ORDER — SODIUM CHLORIDE 0.9% FLUSH: 3.0000 mL | INTRAVENOUS | Status: DC | PRN

## 2017-06-09 MED ORDER — INFLUENZA VAC SPLIT QUAD 0.5 ML IM SUSY
0.5000 mL | PREFILLED_SYRINGE | INTRAMUSCULAR | Status: AC
  Administered 2017-06-10: 0.5 mL via INTRAMUSCULAR
  Filled 2017-06-09: qty 0.5

## 2017-06-09 MED ORDER — CLOPIDOGREL BISULFATE 75 MG PO TABS
75.0000 mg | ORAL_TABLET | Freq: Every day | ORAL | Status: DC
  Administered 2017-06-10: 75 mg via ORAL
  Filled 2017-06-09: qty 1

## 2017-06-09 MED ORDER — LABETALOL HCL 5 MG/ML IV SOLN: 10.0000 mg | INTRAVENOUS | Status: AC | PRN

## 2017-06-09 MED ORDER — SODIUM CHLORIDE 0.9 % IV SOLN: 250.0000 mL | INTRAVENOUS | Status: DC | PRN

## 2017-06-09 MED ORDER — SODIUM CHLORIDE 0.9 % IV SOLN
250.0000 mL | INTRAVENOUS | Status: DC | PRN
Start: 2017-06-09 — End: 2017-06-09

## 2017-06-09 MED ORDER — ISOSORBIDE MONONITRATE ER 30 MG PO TB24
30.0000 mg | ORAL_TABLET | Freq: Every day | ORAL | Status: DC
  Administered 2017-06-09 – 2017-06-10 (×2): 30 mg via ORAL
  Filled 2017-06-09 (×3): qty 1

## 2017-06-09 MED ORDER — MIDAZOLAM HCL 2 MG/2ML IJ SOLN
INTRAMUSCULAR | Status: DC | PRN
  Administered 2017-06-09: 2 mg via INTRAVENOUS

## 2017-06-09 MED ORDER — HEPARIN (PORCINE) IN NACL 2-0.9 UNIT/ML-% IJ SOLN
INTRAMUSCULAR | Status: AC
  Filled 2017-06-09: qty 500

## 2017-06-09 MED ORDER — MIDAZOLAM HCL 2 MG/2ML IJ SOLN
INTRAMUSCULAR | Status: AC
  Filled 2017-06-09: qty 2

## 2017-06-09 MED ORDER — SODIUM CHLORIDE 0.9 % WEIGHT BASED INFUSION
3.0000 mL/kg/h | INTRAVENOUS | Status: DC
  Administered 2017-06-09: 3 mL/kg/h via INTRAVENOUS

## 2017-06-09 MED ORDER — ASPIRIN EC 81 MG PO TBEC
81.0000 mg | DELAYED_RELEASE_TABLET | Freq: Every day | ORAL | Status: DC
  Administered 2017-06-10: 81 mg via ORAL
  Filled 2017-06-09: qty 1

## 2017-06-09 MED ORDER — PANTOPRAZOLE SODIUM 40 MG PO TBEC
40.0000 mg | DELAYED_RELEASE_TABLET | Freq: Every day | ORAL | Status: DC
  Administered 2017-06-09 – 2017-06-10 (×2): 40 mg via ORAL
  Filled 2017-06-09 (×2): qty 1

## 2017-06-09 MED ORDER — SODIUM CHLORIDE 0.9 % WEIGHT BASED INFUSION: 1.0000 mL/kg/h | INTRAVENOUS | Status: DC

## 2017-06-09 SURGICAL SUPPLY — 12 items
BALLN ~~LOC~~ TREK RX 3.75X12 (BALLOONS) ×4
BALLOON ~~LOC~~ TREK RX 3.75X12 (BALLOONS) ×2 IMPLANT
CATH 5F 110X4 TIG (CATHETERS) ×4 IMPLANT
CATH 5FR PIGTAIL DIAGNOSTIC (CATHETERS) ×4 IMPLANT
CATH GUIDE ADROIT 6FR AL.75 (CATHETERS) ×4 IMPLANT
DEVICE INFLAT 30 PLUS (MISCELLANEOUS) ×4 IMPLANT
GLIDESHEATH SLEND SS 6F .021 (SHEATH) ×4 IMPLANT
KIT MANI 3VAL PERCEP (MISCELLANEOUS) ×4 IMPLANT
PACK CARDIAC CATH (CUSTOM PROCEDURE TRAY) ×4 IMPLANT
WIRE ASAHI PROWATER 180CM (WIRE) ×4 IMPLANT
WIRE HITORQ VERSACORE ST 145CM (WIRE) ×4 IMPLANT
WIRE ROSEN-J .035X260CM (WIRE) ×4 IMPLANT

## 2017-06-09 NOTE — Interval H&P Note (Signed)
History and Physical Interval Note:  06/09/2017 8:12 AM  Jennifer [name]  has presented today for cardiac catheterization, with the diagnosis of unstable angina and abnormal stress test. The various methods of treatment have been discussed with the patient and family. After consideration of risks, benefits and other options for treatment, the patient has consented to  Procedure(s): LEFT HEART CATH AND CORONARY ANGIOGRAPHY (Left) as a surgical intervention .  The patient's history has been reviewed, patient examined, no change in status, stable for surgery.  I have reviewed the patient's chart and labs.  Questions were answered to the patient's satisfaction.    Cath Lab Visit (complete for each Cath Lab visit)  Clinical Evaluation Leading to the Procedure:   ACS: No.  Non-ACS:    Anginal Classification: CCS IV  Anti-ischemic medical therapy: Maximal Therapy (2 or more classes of medications)  Non-Invasive Test Results: High-risk stress test findings: cardiac mortality >3%/year  Prior CABG: No previous CABG  Jennifer [name]

## 2017-06-09 NOTE — Progress Notes (Signed)
Dr End notified of patient's elevated BP post cath. Patient states that she did not take any of her home medications today. MD order to give patient's home BP meds at this time. Will continue to monitor.

## 2017-06-09 NOTE — Progress Notes (Signed)
57 year old female with long history of poorly controlled diabetes type 2, Hemoglobin A1C 13, HLD, HTN, MO, Anxiety, Arthritis, Bipolar Disorder, Depression, Diastolic Dysfunction, GERD, MI, RLS, Sleep Apnea, Stroke, Tobacco Abuse, Stress, and CAD with previous stents.   Patient presented to [facility] today for scheduled Cardiac Cath.    Echo performed in November 2018 patient had normal LV function.    Procedures   CORONARY BALLOON ANGIOPLASTY  LEFT HEART CATH AND CORONARY ANGIOGRAPHY  Conclusion   Conclusions: 1. Severe single-vessel coronary artery disease with focal 95% in-stent restenosis of the mid RCA, where two layers of stent are already present. There is also moderate stenosis of the mid/distal RCA (unchanged from prior caths) and high grade disease involving the ostial/proximal segment of the PDA (small vessel). 2. Mild to moderate, nonobstructive CAD involving the left coronary artery. 3. Mildly elevated left ventricular filling pressure. 4. Successful balloon angioplasty to in-stent restenosis of the mid RCA with reduction of stenosis from 95% to 0%.  Recommendations: 1. Admit for overnight observation/extended recovery. 2. Dual antiplatelet therapy with aspirin and clopidogrel through at least 07/2016, ideally lifelong. 3. Aggressive secondary prevention, including smoking cessation. 4. If in-stent restenosis recurs, referral for single vessel CABG, brrachytherapy, or drug-coated balloon angioplasty (not available in the [place]) would need to be considered.   Patient referred to Cardiac Rehab with dx of PTCA.   Reviewed definition of CAD and what that means.  Reviewed the procedure, balloon angioplasty, with patient.  "Angioplasty and Stents" booklet given and reviewed with patient.    Risk factors of heart disease reviewed with patient.   Explained the importance of controlling BP, cholesterol, and blood sugar; maintaining a healthy weight; smoking cessation, and  exercise.  Discussed the importance of following a low sodium, low fat, low cholesterol heart healthy / carb modified diet. Patient requested to see dietitian, as patient stated this is one area she could really improve. Dietitian Consultation entered.    Smoking Cessation:  Patient reported to this RN that she has tapered down to approximately 5 cigarettes per day.  Patient stated she used to smoke 1.5 to 2 packs per day.  Quit Geophysical data processor and "Thinking about Quitting Tobacco - Yes You Can!' informational sheet provided and reviewed with patient.    Exercise discussed.  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 I explained to patient she has been referred to Cardiac Rehab as a result of the angioplasty today.  An overview of the program was provided.  Patient reported with her situation of raising her two grandsons and everything else she is taking care of, there is absolutely no way she would be able to participate in Cardiac Rehab two to three times a week.  Patient is declining to participate in Cardiac Rehab.  Discussed walking plan with gradual increase to 30 minutes per day for a total of 150 minutes per week. Patient stated it is hard to think about doing anything that would benefit herself when she has so much on her plate.  Patient then reported to this RN that she had no idea she did not have Medicaid any longer until today.  Patient will now have to call Social Services about this.    Patient thanked me for coming in to see her and providing the above information.     Army Melia, RN, BSN,  [facility] Cardiovascular and Pulmonary Nurse Navigator

## 2017-06-10 ENCOUNTER — Telehealth: Payer: Self-pay | Admitting: *Deleted

## 2017-06-10 ENCOUNTER — Encounter: Payer: Self-pay | Admitting: Internal Medicine

## 2017-06-10 DIAGNOSIS — Z955 Presence of coronary angioplasty implant and graft: Secondary | ICD-10-CM | POA: Diagnosis not present

## 2017-06-10 DIAGNOSIS — Z720 Tobacco use: Secondary | ICD-10-CM | POA: Diagnosis not present

## 2017-06-10 DIAGNOSIS — R9439 Abnormal result of other cardiovascular function study: Secondary | ICD-10-CM

## 2017-06-10 DIAGNOSIS — I200 Unstable angina: Secondary | ICD-10-CM | POA: Diagnosis not present

## 2017-06-10 DIAGNOSIS — I25110 Atherosclerotic heart disease of native coronary artery with unstable angina pectoris: Secondary | ICD-10-CM | POA: Diagnosis not present

## 2017-06-10 LAB — BASIC METABOLIC PANEL
Anion gap: 5 (ref 5–15)
BUN: 16 mg/dL (ref 6–20)
CHLORIDE: 110 mmol/L (ref 101–111)
CO2: 26 mmol/L (ref 22–32)
Calcium: 8.6 mg/dL — ABNORMAL LOW (ref 8.9–10.3)
Creatinine, Ser: 0.67 mg/dL (ref 0.44–1.00)
GFR calc non Af Amer: >60 mL/min (ref >60)
GLUCOSE: 107 mg/dL — AB (ref 65–99)
Potassium: 3.5 mmol/L (ref 3.5–5.1)
Sodium: 141 mmol/L (ref 135–145)

## 2017-06-10 LAB — GLUCOSE, CAPILLARY
GLUCOSE-CAPILLARY: 120 mg/dL — AB (ref 65–99)
GLUCOSE-CAPILLARY: 140 mg/dL — AB (ref 65–99)

## 2017-06-10 LAB — CBC
HCT: 35.3 % (ref 35.0–47.0)
Hemoglobin: 11.5 g/dL — ABNORMAL LOW (ref 12.0–16.0)
MCH: 31.1 pg (ref 26.0–34.0)
MCHC: 32.5 g/dL (ref 32.0–36.0)
MCV: 95.5 fL (ref 80.0–100.0)
Platelets: 259 10*3/uL (ref 150–440)
RBC: 3.7 MIL/uL — AB (ref 3.80–5.20)
RDW: 15.1 % — ABNORMAL HIGH (ref 11.5–14.5)
WBC: 7.5 10*3/uL (ref 3.6–11.0)

## 2017-06-10 NOTE — Progress Notes (Incomplete)
Initial Nutrition Assessment  DOCUMENTATION CODES:      INTERVENTION:  ***   NUTRITION DIAGNOSIS:     related to   as evidenced by  .  ***  GOAL:      ***  MONITOR:      REASON FOR ASSESSMENT:   Consult Diet education  ASSESSMENT:      ***   57 yo female admitted 2/19 for diagnostic cardiac cath d/t UA & abnormal stress test PMH CAD/UA, T2DM, HTN, GERD, hyperlipidemia,   Patient reports having a good appetite, reports no issues with PO intake, and has stable body weight status.   Labs-- A1c 13 Active smoker   Patient Active Problem List   Diagnosis Date Noted  . Abnormal stress test 06/09/2017  . Unstable angina (HCC) 08/03/2016  . Morbid obesity (HCC) 05/02/2016  . Chest pain with moderate risk for cardiac etiology 03/14/2015  . CAD (coronary artery disease)   . Hospital discharge follow-up 03/07/2015  . Poorly controlled type 2 diabetes mellitus with complication (HCC) 01/16/2015  . Hyperlipidemia 06/22/2014  . Stented coronary artery   . Chronic pain syndrome   . Chest pain at rest 06/14/2014  . Acute bronchitis 06/14/2014  . Tobacco abuse 06/14/2014  . Essential hypertension 06/14/2014  . GERD (gastroesophageal reflux disease) 06/14/2014  . Bipolar disorder (HCC) 06/14/2014  . Chest pain 06/14/2014  -   NUTRITION - FOCUSED PHYSICAL EXAM:  {RD Focused Exam List:21252}  Diet Order:  Diet heart healthy/carb modified Room service appropriate? Yes; Fluid consistency: Thin  EDUCATION NEEDS:      Skin:     Last BM:     Height:   Ht Readings from Last 1 Encounters:  06/09/17 5\' 2"  (1.575 m)    Weight:   Wt Readings from Last 1 Encounters:  06/09/17 208 lb (94.3 kg)    Ideal Body Weight:     BMI:  Body mass index is 38.04 kg/m.  Estimated Nutritional Needs:   Kcal:     Protein:     Fluid:       ***

## 2017-06-10 NOTE — Plan of Care (Signed)
  Completed/Met Education: Knowledge of General Education information will improve 06/10/2017 1137 - Completed/Met by Feliberto Gottron, RN Health Behavior/Discharge Planning: Ability to manage health-related needs will improve 06/10/2017 1137 - Completed/Met by Chriss Czar, Illene Bolus, RN Clinical Measurements: Ability to maintain clinical measurements within normal limits will improve 06/10/2017 1137 - Completed/Met by Chriss Czar, Illene Bolus, RN Will remain free from infection 06/10/2017 1137 - Completed/Met by Feliberto Gottron, RN Diagnostic test results will improve 06/10/2017 1137 - Completed/Met by Feliberto Gottron, RN Respiratory complications will improve 06/10/2017 1137 - Completed/Met by Feliberto Gottron, RN Cardiovascular complication will be avoided 06/10/2017 1137 - Completed/Met by Chriss Czar, Illene Bolus, RN Coping: Level of anxiety will decrease 06/10/2017 1137 - Completed/Met by Chriss Czar, Illene Bolus, RN Safety: Ability to remain free from injury will improve 06/10/2017 1137 - Completed/Met by Feliberto Gottron, RN

## 2017-06-10 NOTE — Telephone Encounter (Signed)
Currently admitted at this time. 

## 2017-06-10 NOTE — Discharge Summary (Signed)
Discharge Summary    Patient ID: Jennifer [name]  MRN: [id number], DOB/AGE: [birth year] 55 y.o.  Admit Date: 06/09/2017 Discharge Date: 06/10/2017  Primary Care Provider: Jeronimo Greaves, MD Primary Cardiologist: Dr. Rockey Situ, MD  Discharge Diagnoses    Principal Problem:   Unstable angina [facility]) Active Problems:   Abnormal stress test   Tobacco abuse   Essential hypertension   Poorly controlled type 2 diabetes mellitus with complication ([place])   CAD (coronary artery disease)   Morbid obesity ([place])   Allergies Allergies  Allergen Reactions  . Carbamazepine Anaphylaxis and Other (See Comments)    Blood dyscrasia when on with Lithium  . Lithium Other (See Comments)    Blood dyscrasia  . Codeine Other (See Comments)    Upset stomach  . Levofloxacin In D5w Swelling and Other (See Comments)    "Salty" sensation in mouth. "Hard time to explain it"  . Methocarbamol Other (See Comments)    Unknown  . Tape Other (See Comments)    Welps, blister on skin  . Lodine [Etodolac] Nausea And Vomiting and Rash  . Relafen [Nabumetone] Diarrhea, Nausea And Vomiting and Rash     History of Present Illness     57 year old female with history of CAD s/p multiple prior stents described below most recently in 12/5282, diastolic dysfunction, DM2, HTN, HLD, sleep apnea not on CPAP, ongoing tobacco abuse - smoking 1/4 pack of cigarettes daily, anxiety, and depression who presented to [facility] for outpatient diagnostic LHC in the setting of unstable angina and abnormal stress test.   Patient previously underwent stenting to the RCA in 2008 followed by recurrence of chest pain in 2010, leading to an abnormal stress test that showed prior inferior infarct with peri-infarct ischemia. Cardiac cath in 2010, showed 95% stenosis in the mid RCA, diffuse 60% stenosis in the proximal RDPA. She underwent successful PCI/DES of the mid RCA without complications. She redeevloped chest pain in 05/2014 and was transferred from  [facility] to [facility]. Troponin was negative. Cardiac cath 06/15/2014, showed widely patent but small left main, mild disease in the LAD and its branches, mild to moderate disease in the LCx and its branches, severe 90% stenosis in the distal RCA s/p PCI/DES 2.5 x 20 mm Synergy drug eluting stent, normal LVSF, LVEDP 28 mm Hg. She was admitted again to the hospital on 02/23/15 with recurrence of chest pain with associated palpitations. Troponin was negative. ECG showed no acute changes. She underwent nuclear stress test on 02/23/15 that showed medium defect of moderate severity present in the mid inferoseptal, mid inferior and apical inferior location. This was consistent with ischemia. EF 54%. This was in intermediate risk study. She underwent cardiac cath on 02/26/15 that showed severe one-vessel CAD with severe ISR in the proximal segment of the previously placed stents in the distal RCA. She underwent successful PCI/DES to the distal RCA for ISR. Mildly elevated LVEDP. Normal EF by nuclear stress testing. She was seen in November 2016 for post-cath follow up and noted nonexertional chest pain lasting 5-10 seconds in duration. She had no exertional symptoms and could go all day running around without any symptoms. She underwent treadmill Myoview on 03/19/15 that was low risk with an EF of 55-65%. She was admitted to [facility] 07/2016 with unstable angina/NSTEMI with peak troponin of 0.12. Echo 07/2016 showed EF 60-65%, no RWMA, Gr1DD, mild AI, left atrium normal in size, RV systolic function normal, PASP normal. LHC 07/2016 showed severe one-vessel CAD affecting the  RCA with multiple previous stenting. There was felt to be a plaque rupture within the proximal to mid stent which was the culprit for her unstable angina. She underwent successful PCI/DES placement to the proximal/mid RCA within the previously placed stent. There was 60% disease in the mid to distal segment which was left to be treated medically. Remains cath details below.  Indefinate DAPT was advised.   She underwent Lexiscan Myoview on 06/04/17 in the setting of supine chest pain which showed a large region of inferior wall ischemia as well as inferior lateral wall ischemia.  EF 57%.  Lateral wall hypokinesis.  High risk scan.  Cardiac catheterization was recommended.  Hospital Course     Consultants: none   She presented to [facility] on 06/09/17 for planned outpatient diagnostic LHC following her abnormal stress test as above. LHC showed severe, single-vessel CAD with focal 95% in-stent restenosis of the mid RCA, where two layers of stent are already present. There is also moderate stenosis of the mid/distal RCA (unchanged from prior caths), and high grade disease involving the ostial/proximal segment of the PDA (small vessel). Mild to moderate, nonobstructive CAD involving the left coronary artery. Mildly elevated LV filling pressure. She underwent successful PTCA to in-stent restenosis of the mid RCA with reduction of stenosis from 95% to 0%. Lifelong DAPT was recommended. Aggressive secondary prevention was also advised, including smoking cessation. Should in-stent restenosis recur, referral for single-vessel CABG, brrachytherapy, or drug-coated balloon angioplasty (not available in the [place]) would need to be considered. Her post-cath course was uncomplicated. She has ambulated without issues. Post-cath vitals and labs are stable. She is tolerating all medications without issues. Post-cath instructions have been discussed in detail.    The patient's right radial cath site has been examined is healing well and without issues at this time. The patient has been seen by Dr. Saunders Revel and felt to be stable for discharge today. All follow up appointments have been made. Discharge medications are listed below. Prescriptions have been reviewed with the patient and sent in to their pharmacy.  _____________  Discharge Vitals Blood pressure (!) 166/93, pulse 67, temperature (!) 97.5 F (36.4  C), temperature source Oral, resp. rate 18, height '5\' 2"'$  (1.575 m), weight 208 lb (94.3 kg), SpO2 96 %.  Filed Weights   06/09/17 0802  Weight: 208 lb (94.3 kg)    Labs & Radiologic Studies    CBC Recent Labs    06/08/17 1411 06/10/17 0406  WBC 8.6 7.5  NEUTROABS 5.7  --   HGB 12.3 11.5*  HCT 36.8 35.3  MCV 94.5 95.5  PLT 290 299   Basic Metabolic Panel Recent Labs    06/08/17 1411 06/10/17 0406  NA 144 141  K 4.0 3.5  CL 110 110  CO2 26 26  GLUCOSE 133* 107*  BUN 18 16  CREATININE 0.89 0.67  CALCIUM 8.8* 8.6*   Liver Function Tests No results for input(s): AST, ALT, ALKPHOS, BILITOT, PROT, ALBUMIN in the last 72 hours. No results for input(s): LIPASE, AMYLASE in the last 72 hours. Cardiac Enzymes No results for input(s): CKTOTAL, CKMB, CKMBINDEX, TROPONINI in the last 72 hours. BNP Invalid input(s): POCBNP D-Dimer No results for input(s): DDIMER in the last 72 hours. Hemoglobin A1C No results for input(s): HGBA1C in the last 72 hours. Fasting Lipid Panel No results for input(s): CHOL, HDL, LDLCALC, TRIG, CHOLHDL, LDLDIRECT in the last 72 hours. Thyroid Function Tests No results for input(s): TSH, T4TOTAL, T3FREE, THYROIDAB in the last  72 hours.  Invalid input(s): FREET3 _____________  Nm Myocar Multi W/spect W/wall Motion / Ef  Result Date: 06/04/2017 Pharmacological myocardial perfusion imaging study with large region of inferior wall ischemia noted on non-attenuation corrected images Inferolateral wall  Ischemia  also noted Attenuation corrected images with ischemia mid to distal RCA, inferolateral,  fixed region proximal to mid RCA Lateral wall hypokinesis, EF estimated at 57% No EKG changes concerning for ischemia at peak stress or in recovery. High risk scan Previous cardiac catheterization with 50% proximal RCA, 50% mid RCA .  Progression of these lesions could explain results above Would recommend cardiac catheterization.  High risk of progression of her  disease that she continues to smoke Signed, Esmond Plants, MD, Ph.D [facility]    Diagnostic Studies/Procedures   LHC 06/09/2017: Coronary Findings   Diagnostic  Dominance: Right  Left Main  Vessel is large. Vessel is angiographically normal.  Left Anterior Descending  Vessel is large.  First Diagonal Branch  Vessel is moderate in size.  1st Diag lesion 40% stenosed  1st Diag lesion is 40% stenosed.  Second Diagonal Branch  Vessel is small in size.  Left Circumflex  Vessel is moderate in size. There is mild diffuse disease throughout the vessel.  First Obtuse Marginal Branch  Vessel is moderate in size.  1st Mrg lesion 25% stenosed  1st Mrg lesion is 25% stenosed.  Second Obtuse Marginal Branch  Vessel is small in size.  Right Coronary Artery  Vessel is large.  Prox RCA to Mid RCA lesion 95% stenosed  Prox RCA to Mid RCA lesion is 95% stenosed. The lesion was previously treated. Previously placed stent displays restenosis. Area of severe focal in-stent restenosis in the mid RCA where 2 previously placed stents overlap.  Mid RCA lesion 60% stenosed  Mid RCA lesion is 60% stenosed.  Dist RCA lesion 0% stenosed  Previously placed Dist RCA stent (unknown type) is widely patent.  Right Posterior Descending Artery  Collaterals  RPDA filled by collaterals from 2nd Sept.    Ost RPDA to RPDA lesion 90% stenosed  Ost RPDA to RPDA lesion is 90% stenosed.  Inferior Septal  Vessel is small in size.  Right Posterior Atrioventricular Branch  Vessel is moderate in size.  Intervention   Prox RCA to Mid RCA lesion  Angioplasty  Balloon angioplasty was performed using a BALLOON [place] TREK RX [id number]. Maximum pressure: 18 atm.  Post-Intervention Lesion Assessment  The intervention was successful. Pre-interventional TIMI flow is 3. Post-intervention TIMI flow is 3. No complications occurred at this lesion.  There is no residual stenosis post intervention.  Left Heart   Left Ventricle  LV end diastolic pressure is mildly elevated. LVEDP 20-25 mmHg.  Aortic Valve There is no aortic valve stenosis.  Coronary Diagrams   Diagnostic Diagram       Post-Intervention Diagram        Conclusion   Conclusions: 1. Severe single-vessel coronary artery disease with focal 95% in-stent restenosis of the mid RCA, where two layers of stent are already present. There is also moderate stenosis of the mid/distal RCA (unchanged from prior caths) and high grade disease involving the ostial/proximal segment of the PDA (small vessel). 2. Mild to moderate, nonobstructive CAD involving the left coronary artery. 3. Mildly elevated left ventricular filling pressure. 4. Successful balloon angioplasty to in-stent restenosis of the mid RCA with reduction of stenosis from 95% to 0%.  Recommendations: 1. Admit for overnight observation/extended recovery. 2. Dual antiplatelet therapy with aspirin  and clopidogrel through at least 07/2016, ideally lifelong. 3. Aggressive secondary prevention, including smoking cessation. 4. If in-stent restenosis recurs, referral for single vessel CABG, brrachytherapy, or drug-coated balloon angioplasty (not available in the [place]) would need to be considered.    _____________  Disposition   Pt is being discharged home today in good condition.  Follow-up Plans & Appointments    Follow-up Information    Rise Mu, PA-C Follow up on 06/17/2017.   Specialties:  Physician Assistant, Cardiology, Radiology Why:  Appointment time 2:30 PM Contact information: [address] [place] [address] [phone]          Discharge Instructions    AMB Referral to Cardiac Rehabilitation - Phase II   Complete by:  As directed    Diagnosis:  PTCA   Diet - low sodium heart healthy   Complete by:  As directed    Increase activity slowly   Complete by:  As directed       Discharge Medications   Allergies as of 06/10/2017       Reactions   Carbamazepine Anaphylaxis, Other (See Comments)   Blood dyscrasia when on with Lithium   Lithium Other (See Comments)   Blood dyscrasia   Codeine Other (See Comments)   Upset stomach   Levofloxacin In D5w Swelling, Other (See Comments)   "Salty" sensation in mouth. "Hard time to explain it"   Methocarbamol Other (See Comments)   Unknown   Tape Other (See Comments)   Welps, blister on skin   Lodine [etodolac] Nausea And Vomiting, Rash   Relafen [nabumetone] Diarrhea, Nausea And Vomiting, Rash      Medication List    TAKE these medications   albuterol 108 (90 Base) MCG/ACT inhaler Commonly known as:  PROVENTIL HFA;VENTOLIN HFA Inhale 1-2 puffs into the lungs every 4 (four) hours as needed for wheezing or shortness of breath.   aspirin 81 MG EC tablet Take 1 tablet (81 mg total) by mouth daily.   atorvastatin 40 MG tablet Commonly known as:  LIPITOR take 1 tablet by mouth once daily as directed What changed:    how much to take  how to take this  when to take this  additional instructions   BASAGLAR KWIKPEN 100 UNIT/ML Sopn Inject 20 units SQ in the morning and inject 50 units SQ at bedtime   Blood Glucose Monitoring Suppl w/Device Kit Test 1-2 times daily; E11.65 diagnosis   clopidogrel 75 MG tablet Commonly known as:  PLAVIX take 1 tablet by mouth once daily WITH BREAKFAST What changed:  See the new instructions.   cyclobenzaprine 10 MG tablet Commonly known as:  FLEXERIL Take 10 mg by mouth 3 (three) times daily as needed for muscle spasms.   furosemide 20 MG tablet Commonly known as:  LASIX take 1 tablet by mouth once daily What changed:    how much to take  how to take this  when to take this   gabapentin 600 MG tablet Commonly known as:  NEURONTIN Take 900 mg by mouth 4 times daily   isosorbide mononitrate 30 MG 24 hr tablet Commonly known as:  IMDUR Take 1 tablet (30 mg total) by mouth daily.   lisinopril 40 MG tablet Commonly  known as:  PRINIVIL,ZESTRIL take 1 tablet by mouth once daily What changed:    how much to take  how to take this  when to take this   metoprolol 200 MG 24 hr tablet Commonly known  as:  TOPROL-XL take 1 tablet by mouth once daily with OR IMMEDIETLY FOLLOWING A MEAL What changed:  See the new instructions.   nitroGLYCERIN 0.4 MG SL tablet Commonly known as:  NITROSTAT Place 1 tablet (0.4 mg total) under the tongue every 5 (five) minutes as needed for chest pain (Do not give more than 3 SL tablets in 15 minutes.).   pantoprazole 40 MG tablet Commonly known as:  PROTONIX Take 1 tablet (40 mg total) by mouth daily.   polyethylene glycol packet Commonly known as:  MIRALAX / GLYCOLAX Take 17 g by mouth daily as needed for mild constipation.   ranolazine 500 MG 12 hr tablet Commonly known as:  RANEXA Take 1 tablet (500 mg total) by mouth 2 (two) times daily.   VICTOZA 18 MG/3ML Sopn Generic drug:  liraglutide Inject 1.2 mg SQ in the morning         Aspirin prescribed at discharge?  Yes High Intensity Statin Prescribed? (Lipitor 40-'80mg'$  or Crestor 20-'40mg'$ ): Yes Beta Blocker Prescribed? Yes For EF <40%, was ACEI/ARB Prescribed? No: EF > 40% ADP Receptor Inhibitor Prescribed? (i.e. Plavix etc.-Includes Medically Managed Patients): Yes For EF <40%, Aldosterone Inhibitor Prescribed? No: EF > 40% Was EF assessed during THIS hospitalization? Yes Was Cardiac Rehab II ordered? (Included Medically managed Patients): Yes   Outstanding Labs/Studies   none  Duration of Discharge Encounter   Greater than 30 minutes including physician time.  Signed, Rise Mu, PA-C [place] Pager: [phone] 06/10/2017, 10:13 AM

## 2017-06-10 NOTE — Progress Notes (Signed)
Cardiology Office Note Date:  06/17/2017  Patient ID:  Jennifer [name], Jennifer [name] 1961/03/06, MRN [id number] PCP:  Jeronimo Greaves, MD  Cardiologist:  Dr. Rockey Situ, MD    Chief Complaint: Hospital follow up  History of Present Illness: Jennifer [name] is a 57 y.o. female with history of CAD s/p multiple prior stents described below, diastolic dysfunction, DM2, HTN, HLD, sleep apnea not on CPAP, ongoing tobacco abuse - smoking 1/4 pack of cigarettes daily, anxiety, and depression who presents for hospital follow up after recent admission to [facility] following planned outpatient diagnostic LHC in the setting of unstable angina and abnormal stress test.   Patient previously underwent stenting to the RCA in 2008 followed by recurrence of chest pain in 2010, leading to an abnormal stress test that showed prior inferior infarct with peri-infarct ischemia. Cardiac cath in 2010, showed 95% stenosis in the mid RCA, diffuse 60% stenosis in the proximal RDPA. She underwent successful PCI/DES of the mid RCA. She redeevloped chest pain in 05/2014 and was transferred from [facility] to [facility]. Troponin was negative. Cardiac cath 06/15/2014, showed widely patent but small left main, mild disease in the LAD and its branches, mild to moderate disease in the LCx and its branches, severe 90% stenosis in the distal RCA s/p PCI/DES, normal LVSF, LVEDP 28 mm Hg. She was admitted on 02/23/15 with recurrence of chest pain with associated palpitations. Troponin was negative. ECG showed no acute changes. She underwent nuclear stress test on 02/23/15 that showed medium defect of moderate severity present in the mid inferoseptal, mid inferior and apical inferior location. This was consistent with ischemia, EF 54%, intermediate risk study. She underwent cardiac cath on 02/26/15 that showed severe one-vessel CAD with severe ISR in the proximal segment of the previously placed stent in the distal RCA. She underwent successful PCI/DES to the distal RCA for ISR.  Mildly elevated LVEDP. Normal EF by nuclear stress testing. She was seen in 02/2015 for post-cath follow up and noted nonexertional chest pain lasting 5-10 seconds in duration. She had no exertional symptoms and could go all day running around without any symptoms. She underwent treadmill Myoview on 03/19/15 that was low risk with an EF of 55-65%. She was admitted to [facility] 07/2016 with unstable angina/NSTEMI with peak troponin of 0.12. Echo 07/2016 showed EF 60-65%, no RWMA, Gr1DD, mild AI, left atrium normal in size, RV systolic function normal, PASP normal. LHC 07/2016 showed severe one-vessel CAD affecting the RCA with multiple previous stenting. There was felt to be a plaque rupture within the proximal to mid stent which was the culprit for her unstable angina. She underwent successful PCI/DES placement to the proximal/mid RCA within the previously placed stent. There was 60% disease in the mid to distal segment which was left to be treated medically. She was seen in the office on 02/18/2017 for unstable angina. She underwent LHC 02/19/2017 that showed the left main normal, LAD normal, D1 40%, LCx minor irregs, OM1 60%, proximal to mid RCA 50-60% stenosed with ISR in the proximal region, mid to distal RCA 50%, RPDA 90% filled by collaterals from 3rd septal,LVEF 55-65%. Medical management was advised. Echo 02/24/2017 showed an EF of 55-60%, no RWMA, Gr1DD, RVSF normal, PASP normal. Seen by Dr. Rockey Situ 05/28/2017 with worsening angina and was started on Imdur. She underwent Lexiscan Myoview on 06/04/17 which showed a large region of inferior wall ischemia as well as inferior lateral wall ischemia, EF 57%, lateral wall hypokinesis, high risk scan. She underwent  LHC 06/09/17 that showed severe, single-vessel CAD with focal 95% in-stent restenosis of the mid RCA, where two layers of stent are already present. There is also moderate stenosis of the mid/distal RCA (unchanged from prior caths), and high grade disease involving the  ostial/proximal segment of the PDA (small vessel). Mild to moderate, nonobstructive CAD involving the left coronary artery. Mildly elevated LV filling pressure. She underwent successful PTCA to in-stent restenosis of the mid RCA with reduction of stenosis from 95% to 0%. Lifelong DAPT was recommended.  She comes in doing well today.  She has had 4-5 fleeting episodes of mild chest discomfort that were not similar to her prior episodes.  Episodes were short-lived, lasting several seconds and spontaneously resolving.  She did not take any sublingual nitroglycerin for these episodes.  She continues to smoke.  She is hoping that vaping will help her to taper off her tobacco abuse.  She continues to be under increased amounts of stress at home.  She is compliant with all of her medications including dual antiplatelet therapy.  She reports a most recent hemoglobin A1c of 7.5 which was improved from greater than 13.  She continues to eat fast/convenience foods.  No issues from right radial cardiac catheterization site.   Past Medical History:  Diagnosis Date  . Anxiety   . Arthritis    "qwhere" (06/14/2014)  . Bipolar disorder ([place])   . CAD (coronary artery disease)    a. 2008 PCI/DES to RCA; b. 2012 PCI: 95% mRCA s/p PCI/DES; b. 2/16 PCI DES-> 90% dRCA; c. 11/16 PCI: dRCA ISR s/p PCI/DES;  d. 4/18 PCI: p-mRCA 95% s/p PCI/DES; e. 02/2017 Cath: LML nl, LAD nl, D1 40, LCX min irregs, OM1 60, RCA 50-60p/m ISR, 82md, patent dRCA stent, RPDA 90ost (fills via L->R collats), EF 55-65%.  . Chronic bronchitis ([place]    "get it q yr"  . Daily headache    "when my blood pressure is up" (06/14/2014)  . Depression   . Diastolic dysfunction    a. 02/2017 Echo: EF 55-60%, no rwma, Gr1 DD, nl RV fxn.  .Marland KitchenGERD (gastroesophageal reflux disease)   . Heart murmur   . High cholesterol   . History of stomach ulcers   . Hypertension   . Insomnia   . Kidney stones   . Migraine    "haven't had them in a good while; did get  them 1-2 times/yr" (06/14/2014)  . Myocardial infarction ([place] 2008; ~ 2012  . Pneumonia    "get it 1-2 times/year" (06/14/2014)  . RLS (restless legs syndrome)   . Seizures ([place]   . Sleep apnea    "they want me to do the lab but I haven't" (06/14/2014)  . Stroke ([facility]    "they say I've had several" (06/14/2014)  . Tobacco abuse   . Type II diabetes mellitus ([place]   . Urinary, incontinence, stress female     Past Surgical History:  Procedure Laterality Date  . ABDOMINAL HYSTERECTOMY  1984   "I've got 1 ovary left"  . CARDIAC CATHETERIZATION N/A 02/26/2015   Procedure: Left Heart Cath and Coronary Angiography;  Surgeon: MWellington Hampshire MD;  [place];  Service: Cardiovascular;  Laterality: N/A;  . CARDIAC CATHETERIZATION N/A 02/26/2015   Procedure: Coronary Stent Intervention;  Surgeon: MWellington Hampshire MD;  [facility];  Service: Cardiovascular;  Laterality: N/A;  . CESAREAN SECTION  1984  . CORONARY ANGIOPLASTY WITH STENT PLACEMENT  2008; ~  2012   "1; 1"  . CORONARY BALLOON ANGIOPLASTY N/A 06/09/2017   Procedure: CORONARY BALLOON ANGIOPLASTY;  Surgeon: Nelva Bush, MD;  Location: [place] [facility] LAB;  Service: Cardiovascular;  Laterality: N/A;  . CORONARY STENT INTERVENTION N/A 08/04/2016   Procedure: Coronary Stent Intervention;  Surgeon: Wellington Hampshire, MD;  Location: [place] [facility];  Service: Cardiovascular;  Laterality: N/A;  . DILATION AND CURETTAGE OF UTERUS    . EXTRACORPOREAL SHOCK WAVE LITHOTRIPSY  X 2  . KNEE ARTHROSCOPY Left   . LEFT HEART CATH Bilateral 08/04/2016   Procedure: Left Heart Cath poss PCI;  Surgeon: Wellington Hampshire, MD;  Location: [place] [facility];  Service: Cardiovascular;  Laterality: Bilateral;  . LEFT HEART CATH AND CORONARY ANGIOGRAPHY Left 02/19/2017   Procedure: LEFT HEART CATH AND CORONARY ANGIOGRAPHY;  Surgeon: Minna Merritts, MD;  Location: [place] [facility] LAB;  Service: Cardiovascular;   Laterality: Left;  . LEFT HEART CATH AND CORONARY ANGIOGRAPHY Left 06/09/2017   Procedure: LEFT HEART CATH AND CORONARY ANGIOGRAPHY;  Surgeon: Nelva Bush, MD;  Location: [place] [facility] LAB;  Service: Cardiovascular;  Laterality: Left;  . LEFT HEART CATHETERIZATION WITH CORONARY ANGIOGRAM N/A 06/15/2014   Procedure: LEFT HEART CATHETERIZATION WITH CORONARY ANGIOGRAM;  Surgeon: Jettie Booze, MD;  Location: [facility] C[facility] LAB;  Service: Cardiovascular;  Laterality: N/A;  . PERCUTANEOUS CORONARY STENT INTERVENTION (PCI-S)  06/15/2014   Procedure: PERCUTANEOUS CORONARY STENT INTERVENTION (PCI-S);  Surgeon: Jettie Booze, MD;  Location: [facility] CATH LAB;  Service: Cardiovascular;;  . TONSILLECTOMY    . TUBAL LIGATION      Current Meds  Medication Sig  . albuterol (PROVENTIL HFA;VENTOLIN HFA) 108 (90 Base) MCG/ACT inhaler Inhale 1-2 puffs into the lungs every 4 (four) hours as needed for wheezing or shortness of breath.   [name] aspirin EC 81 MG EC tablet Take 1 tablet (81 mg total) by mouth daily.  [name] atorvastatin (LIPITOR) 40 MG tablet take 1 tablet by mouth once daily as directed (Patient taking differently: Take 40 mg by mouth at bedtime)  . Blood Glucose Monitoring Suppl W/DEVICE KIT Test 1-2 times daily; E11.65 diagnosis  . clopidogrel (PLAVIX) 75 MG tablet take 1 tablet by mouth once daily WITH BREAKFAST (Patient taking differently: Take 75 mg by mouth once daily with breakfast)  . cyclobenzaprine (FLEXERIL) 10 MG tablet Take 10 mg by mouth 3 (three) times daily as needed for muscle spasms.   . furosemide (LASIX) 20 MG tablet TAKE 1 TABLET BY MOUTH ONCE DAILY  . gabapentin (NEURONTIN) 600 MG tablet Take 900 mg by mouth 4 times daily  . Insulin Glargine (BASAGLAR KWIKPEN) 100 UNIT/ML SOPN Inject 20 units SQ in the morning and inject 50 units SQ at bedtime  . isosorbide mononitrate (IMDUR) 30 MG 24 hr tablet Take 1 tablet (30 mg total) by mouth daily.  [name] liraglutide (VICTOZA) 18 MG/3ML SOPN  Inject 1.2 mg SQ in the morning  . lisinopril (PRINIVIL,ZESTRIL) 40 MG tablet take 1 tablet by mouth once daily (Patient taking differently: Take 40 mg by mouth once daily)  . metoprolol (TOPROL-XL) 200 MG 24 hr tablet take 1 tablet by mouth once daily with OR IMMEDIETLY FOLLOWING A MEAL (Patient taking differently: Take 200 mg by mouth once daily)  . nitroGLYCERIN (NITROSTAT) 0.4 MG SL tablet Place 1 tablet (0.4 mg total) under the tongue every 5 (five) minutes as needed for chest pain (Do not give more than 3 SL tablets in 15 minutes.).  [name]  pantoprazole (PROTONIX) 40 MG tablet Take 1 tablet (40 mg total) by mouth daily.  . polyethylene glycol (MIRALAX / GLYCOLAX) packet Take 17 g by mouth daily as needed for mild constipation.  . [DISCONTINUED] ranolazine (RANEXA) 500 MG 12 hr tablet Take 1 tablet (500 mg total) by mouth 2 (two) times daily.    Allergies:   Carbamazepine; Lithium; Codeine; Levofloxacin in d5w; Methocarbamol; Tape; Lodine [etodolac]; and Relafen [nabumetone]   Social History:  The patient  reports that she has been smoking cigarettes.  She has a 8.75 pack-year smoking history. she has never used smokeless tobacco. She reports that she drinks alcohol. She reports that she uses drugs. Drug: Marijuana.   Family History:  The patient's family history includes Cirrhosis in her father; Lung cancer in her mother.  ROS:   Review of Systems  Constitutional: Positive for malaise/fatigue. Negative for chills, diaphoresis, fever and weight loss.  HENT: Negative for congestion.   Eyes: Negative for discharge and redness.  Respiratory: Negative for cough, hemoptysis, sputum production, shortness of breath and wheezing.   Cardiovascular: Positive for chest pain. Negative for palpitations, orthopnea, claudication, leg swelling and PND.  Gastrointestinal: Negative for abdominal pain, blood in stool, heartburn, melena, nausea and vomiting.  Genitourinary: Negative for hematuria.    Musculoskeletal: Negative for falls and myalgias.  Skin: Negative for rash.  Neurological: Negative for dizziness, tingling, tremors, sensory change, speech change, focal weakness, loss of consciousness and weakness.  Endo/Heme/Allergies: Does not bruise/bleed easily.  Psychiatric/Behavioral: Positive for substance abuse. The patient is not nervous/anxious.   All other systems reviewed and are negative.    PHYSICAL EXAM:  VS:  BP 120/80 (BP Location: Left Arm, Patient Position: Sitting, Cuff Size: Normal)   Pulse 84   Ht _0  (1.575 m)   Wt 208 lb 8 oz (94.6 kg)   BMI 38.14 kg/m  BMI: Body mass index is 38.14 kg/m.  Physical Exam  Constitutional: She is oriented to person, place, and time. She appears well-developed and well-nourished.  HENT:  Head: Normocephalic and atraumatic.  Eyes: Right eye exhibits no discharge. Left eye exhibits no discharge.  Neck: Normal range of motion. No JVD present.  Cardiovascular: Normal rate, regular rhythm, S1 normal, S2 normal and normal heart sounds. Exam reveals no distant heart sounds, no friction rub, no midsystolic click and no opening snap.  No murmur heard. Right radial cardiac catheterization site well-healing.  No bruising, active bleeding, erythema, swelling, warmth, or tenderness to palpation.  Right radial pulse 2+.  Pulmonary/Chest: Effort normal and breath sounds normal. No respiratory distress. She has no decreased breath sounds. She has no wheezes. She has no rales. She exhibits no tenderness.  Abdominal: Soft. She exhibits no distension. There is no tenderness.  Musculoskeletal: She exhibits no edema.  Neurological: She is alert and oriented to person, place, and time.  Skin: Skin is warm and dry. No cyanosis. Nails show no clubbing.  Psychiatric: She has a normal mood and affect. Her speech is normal and behavior is normal. Judgment and thought content normal.     EKG:  Was ordered and interpreted by me today. Shows NSR, 84  bpm, prior inferior infarct, no acute st/t changes   Recent Labs: 11/20/2016: ALT 13 06/10/2017: BUN 16; Creatinine, Ser 0.67; Hemoglobin 11.5; Platelets 259; Potassium 3.5; Sodium 141  08/02/2016: Cholesterol 140; HDL 35; LDL Cholesterol 71; Total CHOL/HDL Ratio 4.0; Triglycerides 172; VLDL 34   Estimated Creatinine Clearance: 86.2 mL/min (by C-G formula based on SCr  of 0.67 mg/dL).   Wt Readings from Last 3 Encounters:  06/17/17 208 lb 8 oz (94.6 kg)  06/09/17 208 lb (94.3 kg)  05/28/17 208 lb 8 oz (94.6 kg)     Other studies reviewed: Additional studies/records reviewed today include: summarized above  ASSESSMENT AND PLAN:  1. CAD of native coronary artery with stable angina: Currently chest pain-free.  She is status post multiple stents to the RCA as detailed above, now status post PTCA of the in-stent restenosis as detailed above with 0% residual stenosis.  She has had 4-5 fleeting episodes of mild chest discomfort that have not been similar to her prior episodes of chest pain.  We discussed escalation of medical therapy in detail with her preference being to titrate Ranexa to 1000 mg twice daily rather than titration of isosorbide mononitrate given associated headache with this medication.  She is already on max dose metoprolol.  She has sublingual nitroglycerin to use as needed.  Continue dual antiplatelet therapy with aspirin and Plavix indefinitely.  Plan to refer to cardiac rehabilitation at follow-up.  If she has restenosis along the RCA she would require referral to cardiothoracic surgery for possible one-vessel bypass.  Post cath instructions.  No plans for further ischemic evaluation at this time.  2. Essential hypertension: Blood pressure is well controlled today.  Continue current medications including Imdur, lisinopril, Toprol XL.  3. Hyperlipidemia: LDL of 71 in 07/2016.  Continue Lipitor 40 mg daily.  Plan to check fasting lipid and liver function on 2/28.  4. Tobacco  abuse/COPD: No active exacerbation at this time.  Tobacco cessation is advised.  Discussed techniques in detail today.  She will call if she feels like she needs assistance.  5. Diabetes: Followed by PCP.  6. Morbid obesity: Weight loss advised.  Consider cardiac rehab if patient schedule would allow.  Disposition: F/u with Dr. Rockey Situ in 3 months.   Current medicines are reviewed at length with the patient today.  The patient did not have any concerns regarding medicines.  Signed, Christell Faith, PA-C 06/17/2017 3:25 PM     [facility] [address] [place], [place] [address] [phone]

## 2017-06-10 NOTE — Progress Notes (Signed)
Patient given discharge instructions. Both IV's taken out and tele monitor off. Patient verbalized understanding with no further questions. Patient transported out via wheelchair.

## 2017-06-10 NOTE — Progress Notes (Signed)
Progress Note  Patient Name: Jennifer [name] Date of Encounter: 06/10/2017  Primary Cardiologist: Rockey Situ  Subjective   Feels great this morning. No further chest pain. No SOB. Post-cath labs and vitals stable. Ready to go home.   Inpatient Medications    Scheduled Meds: . aspirin EC  81 mg Oral Daily  . atorvastatin  40 mg Oral QHS  . clopidogrel  75 mg Oral Q breakfast  . gabapentin  900 mg Oral QID  . heparin  5,000 Units Subcutaneous Q8H  . Influenza vac split quadrivalent PF  0.5 mL Intramuscular Tomorrow-1000  . insulin aspart  0-15 Units Subcutaneous TID WC  . insulin glargine  20 Units Subcutaneous QAC breakfast  . insulin glargine  50 Units Subcutaneous Q2200  . isosorbide mononitrate  30 mg Oral Daily  . lisinopril  40 mg Oral Daily  . metoprolol  200 mg Oral Daily  . pantoprazole  40 mg Oral Daily  . ranolazine  500 mg Oral BID  . sodium chloride flush  3 mL Intravenous Q12H   Continuous Infusions: . sodium chloride     PRN Meds: sodium chloride, acetaminophen, albuterol, alum & mag hydroxide-simeth, cyclobenzaprine, ibuprofen, nitroGLYCERIN, ondansetron (ZOFRAN) IV, sodium chloride flush   Vital Signs    Vitals:   06/09/17 1536 06/09/17 2019 06/10/17 0522 06/10/17 0730  BP: 133/64 128/70 128/87 (!) 166/93  Pulse: 68 71 60 67  Resp: 18  18   Temp: 97.6 F (36.4 C) 98.8 F (37.1 C) 98 F (36.7 C) (!) 97.5 F (36.4 C)  TempSrc: Oral Oral  Oral  SpO2:  97% 96%   Weight:      Height:        Intake/Output Summary (Last 24 hours) at 06/10/2017 0853 Last data filed at 06/09/2017 2251 Gross per 24 hour  Intake 240 ml  Output 1400 ml  Net -1160 ml   Filed Weights   06/09/17 0802  Weight: 208 lb (94.3 kg)    Telemetry    NSR - Personally Reviewed  ECG    n/a - Personally Reviewed  Physical Exam   GEN: No acute distress.   Neck: No JVD. Cardiac: RRR, no murmurs, rubs, or gallops. Right radial cardiac cath site without bleeding, bruising,  swelling, erythema, warmth, or TTP. Radial pulse 2+. Brace noted on the dorsal aspect of the right wrist (to remind the patient not to use the right wrist). Respiratory: Clear to auscultation bilaterally.  GI: Soft, nontender, non-distended.   MS: No edema; No deformity. Neuro:  Alert and oriented x 3; Nonfocal.  Psych: Normal affect.  Labs    Chemistry Recent Labs  Lab 06/08/17 1411 06/10/17 0406  NA 144 141  K 4.0 3.5  CL 110 110  CO2 26 26  GLUCOSE 133* 107*  BUN 18 16  CREATININE 0.89 0.67  CALCIUM 8.8* 8.6*  GFRNONAA >60 >60  GFRAA >60 >60  ANIONGAP 8 5     Hematology Recent Labs  Lab 06/08/17 1411 06/10/17 0406  WBC 8.6 7.5  RBC 3.89 3.70*  HGB 12.3 11.5*  HCT 36.8 35.3  MCV 94.5 95.5  MCH 31.5 31.1  MCHC 33.3 32.5  RDW 15.1* 15.1*  PLT 290 259    Cardiac EnzymesNo results for input(s): TROPONINI in the last 168 hours. No results for input(s): TROPIPOC in the last 168 hours.   BNPNo results for input(s): BNP, PROBNP in the last 168 hours.   DDimer No results for input(s): DDIMER in  the last 168 hours.   Radiology    No results found.  Cardiac Studies   LHC 06/09/17: Coronary Findings   Diagnostic  Dominance: Right  Left Main  Vessel is large. Vessel is angiographically normal.  Left Anterior Descending  Vessel is large.  First Diagonal Branch  Vessel is moderate in size.  1st Diag lesion 40% stenosed  1st Diag lesion is 40% stenosed.  Second Diagonal Branch  Vessel is small in size.  Left Circumflex  Vessel is moderate in size. There is mild diffuse disease throughout the vessel.  First Obtuse Marginal Branch  Vessel is moderate in size.  1st Mrg lesion 25% stenosed  1st Mrg lesion is 25% stenosed.  Second Obtuse Marginal Branch  Vessel is small in size.  Right Coronary Artery  Vessel is large.  Prox RCA to Mid RCA lesion 95% stenosed  Prox RCA to Mid RCA lesion is 95% stenosed. The lesion was previously treated. Previously placed  stent displays restenosis. Area of severe focal in-stent restenosis in the mid RCA where 2 previously placed stents overlap.  Mid RCA lesion 60% stenosed  Mid RCA lesion is 60% stenosed.  Dist RCA lesion 0% stenosed  Previously placed Dist RCA stent (unknown type) is widely patent.  Right Posterior Descending Artery  Collaterals  RPDA filled by collaterals from 2nd Sept.    Ost RPDA to RPDA lesion 90% stenosed  Ost RPDA to RPDA lesion is 90% stenosed.  Inferior Septal  Vessel is small in size.  Right Posterior Atrioventricular Branch  Vessel is moderate in size.  Intervention   Prox RCA to Mid RCA lesion  Angioplasty  Balloon angioplasty was performed using a BALLOON  TREK RX 3.75X12. Maximum pressure: 18 atm.  Post-Intervention Lesion Assessment  The intervention was successful. Pre-interventional TIMI flow is 3. Post-intervention TIMI flow is 3. No complications occurred at this lesion.  There is no residual stenosis post intervention.  Left Heart   Left Ventricle LV end diastolic pressure is mildly elevated. LVEDP 20-25 mmHg.  Aortic Valve There is no aortic valve stenosis.  Coronary Diagrams   Diagnostic Diagram       Post-Intervention Diagram        Conclusion   Conclusions: 1. Severe single-vessel coronary artery disease with focal 95% in-stent restenosis of the mid RCA, where two layers of stent are already present. There is also moderate stenosis of the mid/distal RCA (unchanged from prior caths) and high grade disease involving the ostial/proximal segment of the PDA (small vessel). 2. Mild to moderate, nonobstructive CAD involving the left coronary artery. 3. Mildly elevated left ventricular filling pressure. 4. Successful balloon angioplasty to in-stent restenosis of the mid RCA with reduction of stenosis from 95% to 0%.  Recommendations: 1. Admit for overnight observation/extended recovery. 2. Dual antiplatelet therapy with aspirin and clopidogrel through  at least 07/2016, ideally lifelong. 3. Aggressive secondary prevention, including smoking cessation. 4. If in-stent restenosis recurs, referral for single vessel CABG, brrachytherapy, or drug-coated balloon angioplasty (not available in the [place]) would need to be considered.     Patient Profile     57 y.o. female with history of coronary artery disease status post multiple right coronary artery interventions, hypertension, hyperlipidemia, depression, anxiety, bipolar disorder, diabetes, and ongoing tobacco abuse who presented for outpatient diagnostic LHC as above.   Assessment & Plan    1. CAD in native coronary artery without angina: -Status post multiple stents to the RCA, now s/p PTCA of the in-stent restenosis  along the mid RCA with 0% residual stenosis -Aggressive secondary prevention, including smoking cessation -If she has recurrence of in-stent restenosis, consider referral for single vessel CABG, brrachytherapy, or drug-coated balloon angioplasty (not available in the [place]) -DAPT with ASA and Plavix -Imdur, Ranexa, Toprol -Cardiac rehab -Post-cath instructions   2. HTN: -Well controlled -Continue current medications  3. HLD: -Lipitor -LDL 71 in 07/2016  4. Tobacco abuse/COPD: -Cessation advised -Counseled patient   5. Morbid obesity: -Weight loss advised  6. DM2: -Followed by PCP   For questions or updates, please contact [facility] Please consult [email or web address] for contact info under Cardiology/STEMI.    Signed, Christell Faith, PA-C [place] Pager: [phone] 06/10/2017, 8:53 AM

## 2017-06-10 NOTE — Care Management (Signed)
Elective cardiac cath with intervention without complications.  Patient is not discharging on any new meds or Is not being discharged home on cost prohibitive antiplatelet medication.  No discharge needs

## 2017-06-10 NOTE — Telephone Encounter (Signed)
Patient contacted regarding discharge from [facility] on 06/10/17.  Patient understands to follow up with provider Eula Listenyan Dunn PA on 06/17/17 at 2:30pm [facility]. Patient understands discharge instructions? Yes Patient understands medications and regiment? Yes Patient understands to bring all medications to this visit? Yes  Patient confirmed appointment with no further questions at this time.

## 2017-06-16 ENCOUNTER — Ambulatory Visit: Payer: Medicare Other | Admitting: Cardiovascular Disease

## 2017-06-16 ENCOUNTER — Other Ambulatory Visit: Payer: Self-pay | Admitting: Physician Assistant

## 2017-06-17 ENCOUNTER — Encounter: Payer: Self-pay | Admitting: Physician Assistant

## 2017-06-17 ENCOUNTER — Ambulatory Visit (INDEPENDENT_AMBULATORY_CARE_PROVIDER_SITE_OTHER): Payer: Medicare Other | Admitting: Physician Assistant

## 2017-06-17 VITALS — BP 120/80 | HR 84 | Ht 62.0 in | Wt 208.5 lb

## 2017-06-17 DIAGNOSIS — E118 Type 2 diabetes mellitus with unspecified complications: Secondary | ICD-10-CM | POA: Diagnosis not present

## 2017-06-17 DIAGNOSIS — I25118 Atherosclerotic heart disease of native coronary artery with other forms of angina pectoris: Secondary | ICD-10-CM

## 2017-06-17 DIAGNOSIS — E1165 Type 2 diabetes mellitus with hyperglycemia: Secondary | ICD-10-CM | POA: Diagnosis not present

## 2017-06-17 DIAGNOSIS — J449 Chronic obstructive pulmonary disease, unspecified: Secondary | ICD-10-CM

## 2017-06-17 DIAGNOSIS — I200 Unstable angina: Secondary | ICD-10-CM

## 2017-06-17 DIAGNOSIS — I10 Essential (primary) hypertension: Secondary | ICD-10-CM | POA: Diagnosis not present

## 2017-06-17 DIAGNOSIS — E782 Mixed hyperlipidemia: Secondary | ICD-10-CM

## 2017-06-17 MED ORDER — RANOLAZINE ER 1000 MG PO TB12: ORAL_TABLET | ORAL | 3 refills | Status: DC

## 2017-06-17 NOTE — Patient Instructions (Addendum)
Medication Instructions: - Your physician has recommended you make the following change in your medication:  1) INCREASE Ranexa to 1000 mg- take 1 tablet by mouth TWICE daily  Labwork: - Your physician recommends that you return for FASTING lab work tomorrow: Lipid/ CMET  Procedures/Testing: - none ordered  Follow-Up: - Your physician recommends that you schedule a follow-up appointment in: 3 months with Dr. Mariah MillingGollan.   Any Additional Special Instructions Will Be Listed Below (If Applicable).     If you need a refill on your cardiac medications before your next appointment, please call your pharmacy.

## 2017-06-18 ENCOUNTER — Other Ambulatory Visit
Admission: RE | Admit: 2017-06-18 | Discharge: 2017-06-18 | Disposition: A | Discharge status: Home / Self Care | Payer: Medicare Other | Source: Ambulatory Visit | Attending: Physician Assistant | Admitting: Physician Assistant

## 2017-06-18 DIAGNOSIS — E782 Mixed hyperlipidemia: Secondary | ICD-10-CM | POA: Diagnosis present

## 2017-06-18 DIAGNOSIS — I25118 Atherosclerotic heart disease of native coronary artery with other forms of angina pectoris: Secondary | ICD-10-CM | POA: Diagnosis present

## 2017-06-18 LAB — LIPID PANEL
CHOLESTEROL: 156 mg/dL (ref 0–200)
HDL: 41 mg/dL (ref >40)
LDL Cholesterol: 94 mg/dL (ref 0–99)
Total CHOL/HDL Ratio: 3.8 RATIO
Triglycerides: 107 mg/dL (ref <150)
VLDL: 21 mg/dL (ref 0–40)

## 2017-06-18 LAB — COMPREHENSIVE METABOLIC PANEL
ALT: 15 U/L (ref 14–54)
AST: 15 U/L (ref 15–41)
Albumin: 4 g/dL (ref 3.5–5.0)
Alkaline Phosphatase: 75 U/L (ref 38–126)
Anion gap: 11 (ref 5–15)
BUN: 18 mg/dL (ref 6–20)
CHLORIDE: 108 mmol/L (ref 101–111)
CO2: 24 mmol/L (ref 22–32)
CREATININE: 0.96 mg/dL (ref 0.44–1.00)
Calcium: 9.2 mg/dL (ref 8.9–10.3)
GFR calc Af Amer: >60 mL/min (ref >60)
GFR calc non Af Amer: >60 mL/min (ref >60)
Glucose, Bld: 104 mg/dL — ABNORMAL HIGH (ref 65–99)
Potassium: 4 mmol/L (ref 3.5–5.1)
Sodium: 143 mmol/L (ref 135–145)
Total Bilirubin: 0.6 mg/dL (ref 0.3–1.2)
Total Protein: 7.5 g/dL (ref 6.5–8.1)

## 2017-06-22 ENCOUNTER — Telehealth: Payer: Self-pay | Admitting: Cardiovascular Disease

## 2017-06-22 DIAGNOSIS — I10 Essential (primary) hypertension: Secondary | ICD-10-CM

## 2017-06-22 DIAGNOSIS — E782 Mixed hyperlipidemia: Secondary | ICD-10-CM

## 2017-06-22 DIAGNOSIS — Z79899 Other long term (current) drug therapy: Secondary | ICD-10-CM

## 2017-06-22 MED ORDER — ATORVASTATIN CALCIUM 80 MG PO TABS: 80.0000 mg | ORAL_TABLET | Freq: Every day | ORAL | 3 refills | Status: DC

## 2017-06-22 NOTE — Telephone Encounter (Signed)
Results of lipid/liver profile called to patient. She verbalized understanding. Rx for Lipitor 80 sent to pharmacy. [facility]th, 2019 for repeat Lipid/Liver and to be fasting. Orders entered.

## 2017-06-22 NOTE — Telephone Encounter (Signed)
Patient returning call re: lab results  °

## 2017-08-17 ENCOUNTER — Other Ambulatory Visit: Payer: Self-pay

## 2017-08-17 ENCOUNTER — Telehealth: Payer: Self-pay | Admitting: Cardiovascular Disease

## 2017-08-17 MED ORDER — LISINOPRIL 40 MG PO TABS: 40.0000 mg | ORAL_TABLET | Freq: Every day | ORAL | 3 refills | Status: DC

## 2017-08-17 MED ORDER — METOPROLOL SUCCINATE ER 200 MG PO TB24: ORAL_TABLET | ORAL | 3 refills | Status: DC

## 2017-08-17 NOTE — Telephone Encounter (Signed)
°*  STAT* If patient is at the pharmacy, call can be transferred to refill team.   1. Which medications need to be refilled? (please list name of each medication and dose if known)  Metoprolol and Lisinopril   2. Which pharmacy/location (including street and city if local pharmacy) is medication to be sent to? [facility] on [address]     3. Do they need a 30 day or 90 day supply?  90 day

## 2017-08-17 NOTE — Telephone Encounter (Signed)
Refills sent for Metoprolol and Lisinopril to pharmacy listed.

## 2017-09-05 NOTE — Progress Notes (Deleted)
Cardiology Office Note  Date:  09/05/2017   ID:  Jennifer [name], DOB [birth year], MRN [id number]  PCP:  Jeronimo Greaves, MD   No chief complaint on file.   HPI:  Jennifer [name] is a 57 year old woman with long history of  Poorly controlled diabetes type 2, hemoglobin A1c 13 Active smoker coronary artery disease, stent to the mid RCA 2008, repeat stenting to the mid RCA 2012 ,   catheterization February 2016 showing severe distal RCA disease estimated at 90%, patent mid RCA stents, also with 70% mid to distal circumflex disease of a small vessel,  Catheterization April 2018 stent to the right Catheterization November 2018 medical management recommended presenting for routine follow-up of her coronary artery disease  Cardiac catheterization November 2018 Medical management recommended for moderate proximal RCA disease, left circumflex disease  Echocardiogram done November 2018 Normal LV function  cardiac catheterization April 2018 for unstable angina, minimal troponin elevation,  Procedure April 2018 with ISR of RCA stent proximal to mid Vessel PCI with resolute onyx 3.5 x 30 mm  In follow-up today she reports having worsening chest pain radiating up to her neck over the past week or so Typically happens at nighttime at rest On last office visit also reported having occasional chest pain Now she is taking nitroglycerin for symptoms Some symptoms on exertion over the past several days like in the morning Symptoms often resolved with a big belch Trouble receiving her Protonix prescription  Still smoking Has not seen primary care in follow-up for her diabetes  EKG personally reviewed by myself on todays visit Shows normal sinus rhythm with rate 82 bpm no significant ST or T wave changes   Other past medical history reviewed lost her 65-monthold grandson, died in the crib She attempted rescue breaths  diabetes,  hemoglobin A1c greater than 13 started on insulin,  struggles to  keep her sugars down  Despite her best efforts with diet, did not notice much improvement   Thyroid nodule, [facility]  Last stress test 03/19/2015  She reports having significant leg pain, unable to walk very far secondary to discomfort.   reports having chronic pain issues, sees the pain clinic   Cardiac catheterization February 2016   The left main coronary artery is a small vessel but widely patent. The left anterior descending artery,  In the ostium of the lower pole, there is a moderate stenosis. The left circumflex artery AV groove circumflex has a focal 70% stenosis. This vessel is quite small, likely less than 2 mm in diameter. The right coronary artery stents in the mid right coronary artery appear widely patent. There is mild disease proximally. In the distal RCA, moderate disease followed by a focal severe, 90% stenosis.  A 2.5 x 27 drug-eluting stent was deployed. postdilated with a 3.0 x 15 balloon.    PMH:   has a past medical history of Anxiety, Arthritis, Bipolar disorder ([place], CAD (coronary artery disease), Chronic bronchitis ([place], Daily headache, Depression, Diastolic dysfunction, GERD (gastroesophageal reflux disease), Heart murmur, High cholesterol, History of stomach ulcers, Hypertension, Insomnia, Kidney stones, Migraine, [place]ocardial infarction ([place] (2008; ~ 2012), Pneumonia, RLS (restless legs syndrome), Seizures ([place], Sleep apnea, Stroke ([place], Tobacco abuse, Type II diabetes mellitus ([place], and Urinary, incontinence, stress female.  PSH:    Past Surgical History:  Procedure Laterality Date  . ABDOMINAL HYSTERECTOMY  1984   "I've got 1 ovary left"  . CARDIAC CATHETERIZATION N/A 02/26/2015   Procedure: Left  Heart Cath and Coronary Angiography;  Surgeon: Wellington Hampshire, MD;  Location: [place] [facility];  Service: Cardiovascular;  Laterality: N/A;  . CARDIAC CATHETERIZATION N/A 02/26/2015   Procedure: Coronary Stent  Intervention;  Surgeon: Wellington Hampshire, MD;  Location: [place] [facility];  Service: Cardiovascular;  Laterality: N/A;  . CESAREAN SECTION  1984  . CORONARY ANGIOPLASTY WITH STENT PLACEMENT  2008; ~ 2012   "1; 1"  . CORONARY BALLOON ANGIOPLASTY N/A 06/09/2017   Procedure: CORONARY BALLOON ANGIOPLASTY;  Surgeon: Nelva Bush, MD;  Location: [place] [facility];  Service: Cardiovascular;  Laterality: N/A;  . CORONARY STENT INTERVENTION N/A 08/04/2016   Procedure: Coronary Stent Intervention;  Surgeon: Wellington Hampshire, MD;  Location: [place] [facility];  Service: Cardiovascular;  Laterality: N/A;  . DILATION AND CURETTAGE OF UTERUS    . EXTRACORPOREAL SHOCK WAVE LITHOTRIPSY  X 2  . KNEE ARTHROSCOPY Left   . LEFT HEART CATH Bilateral 08/04/2016   Procedure: Left Heart Cath poss PCI;  Surgeon: Wellington Hampshire, MD;  Location: [place] [facility];  Service: Cardiovascular;  Laterality: Bilateral;  . LEFT HEART CATH AND CORONARY ANGIOGRAPHY Left 02/19/2017   Procedure: LEFT HEART CATH AND CORONARY ANGIOGRAPHY;  Surgeon: Minna Merritts, MD;  Location: [place] [facility];  Service: Cardiovascular;  Laterality: Left;  . LEFT HEART CATH AND CORONARY ANGIOGRAPHY Left 06/09/2017   Procedure: LEFT HEART CATH AND CORONARY ANGIOGRAPHY;  Surgeon: Nelva Bush, MD;  Location: [place] [facility];  Service: Cardiovascular;  Laterality: Left;  . LEFT HEART CATHETERIZATION WITH CORONARY ANGIOGRAM N/A 06/15/2014   Procedure: LEFT HEART CATHETERIZATION WITH CORONARY ANGIOGRAM;  Surgeon: Jettie Booze, MD;  Location: [facility] CATH LAB;  Service: Cardiovascular;  Laterality: N/A;  . PERCUTANEOUS CORONARY STENT INTERVENTION (PCI-S)  06/15/2014   Procedure: PERCUTANEOUS CORONARY STENT INTERVENTION (PCI-S);  Surgeon: Jettie Booze, MD;  Location: [facility] C[facility] LAB;  Service: Cardiovascular;;  . TONSILLECTOMY    . TUBAL LIGATION      Current Outpatient Medications  Medication Sig Dispense Refill  .  albuterol (PROVENTIL HFA;VENTOLIN HFA) 108 (90 Base) MCG/ACT inhaler Inhale 1-2 puffs into the lungs every 4 (four) hours as needed for wheezing or shortness of breath.     [name] aspirin EC 81 MG EC tablet Take 1 tablet (81 mg total) by mouth daily. 30 tablet 2  . atorvastatin (LIPITOR) 80 MG tablet Take 1 tablet (80 mg total) by mouth daily. 90 tablet 3  . Blood Glucose Monitoring Suppl W/DEVICE KIT Test 1-2 times daily; E11.65 diagnosis 1 each 0  . clopidogrel (PLAVIX) 75 MG tablet take 1 tablet by mouth once daily WITH BREAKFAST (Patient taking differently: Take 75 mg by mouth once daily with breakfast) 90 tablet 3  . cyclobenzaprine (FLEXERIL) 10 MG tablet Take 10 mg by mouth 3 (three) times daily as needed for muscle spasms.     . furosemide (LASIX) 20 MG tablet TAKE 1 TABLET BY MOUTH ONCE DAILY 30 tablet 3  . gabapentin (NEURONTIN) 600 MG tablet Take 900 mg by mouth 4 times daily  0  . Insulin Glargine (BASAGLAR KWIKPEN) 100 UNIT/ML SOPN Inject 20 units SQ in the morning and inject 50 units SQ at bedtime    . isosorbide mononitrate (IMDUR) 30 MG 24 hr tablet Take 1 tablet (30 mg total) by mouth daily. 30 tablet 6  . liraglutide (VICTOZA) 18 MG/3ML SOPN Inject 1.2 mg SQ in the morning    . lisinopril (PRINIVIL,ZESTRIL)  40 MG tablet Take 1 tablet (40 mg total) by mouth daily. 90 tablet 3  . metoprolol (TOPROL-XL) 200 MG 24 hr tablet Take 200 mg by mouth once daily 90 tablet 3  . nitroGLYCERIN (NITROSTAT) 0.4 MG SL tablet Place 1 tablet (0.4 mg total) under the tongue every 5 (five) minutes as needed for chest pain (Do not give more than 3 SL tablets in 15 minutes.). 20 tablet 2  . pantoprazole (PROTONIX) 40 MG tablet Take 1 tablet (40 mg total) by mouth daily. 30 tablet 6  . polyethylene glycol (MIRALAX / GLYCOLAX) packet Take 17 g by mouth daily as needed for mild constipation.    . ranolazine (RANEXA) 1000 MG SR tablet Take 1 tablet (1,000 mg) by mouth TWICE daily 180 tablet 3   No current  facility-administered medications for this visit.      Allergies:   Carbamazepine; Lithium; Codeine; Levofloxacin in d5w; Methocarbamol; Tape; Lodine [etodolac]; and Relafen [nabumetone]   Social History:  The patient  reports that she has been smoking cigarettes.  She has a 8.75 pack-year smoking history. She has never used smokeless tobacco. She reports that she drinks alcohol. She reports that she has current or past drug history. Drug: Marijuana.   Family History:   family history includes Cirrhosis in her father; Lung cancer in her mother.    Review of Systems: Review of Systems  Constitutional: Negative.   Respiratory: Negative.   Cardiovascular: Positive for chest pain.  Gastrointestinal: Negative.        Belching  Neurological: Negative.   Psychiatric/Behavioral: Positive for depression. The patient is nervous/anxious.   All other systems reviewed and are negative.    PHYSICAL EXAM: VS:  There were no vitals taken for this visit. , BMI There is no height or weight on file to calculate BMI. Constitutional:  oriented to person, place, and time. No distress.  HENT:  Head: Normocephalic and atraumatic.  Eyes:  no discharge. No scleral icterus.  Neck: Normal range of motion. Neck supple. No JVD present.  Cardiovascular: Normal rate, regular rhythm, normal heart sounds and intact distal pulses. Exam reveals no gallop and no friction rub. No edema No murmur heard. Pulmonary/Chest: Effort normal and breath sounds normal. No stridor. No respiratory distress.  no wheezes.  no rales.  no tenderness.  Abdominal: Soft.  no distension.  no tenderness.  Musculoskeletal: Normal range of motion.  no  tenderness or deformity.  Neurological:  normal muscle tone. Coordination normal. No atrophy Skin: Skin is warm and dry. No rash noted. not diaphoretic.  Psychiatric:  normal mood and affect. behavior is normal. Thought content normal.    Recent Labs: 06/10/2017: Hemoglobin 11.5;  Platelets 259 06/18/2017: ALT 15; BUN 18; Creatinine, Ser 0.96; Potassium 4.0; Sodium 143    Lipid Panel Lab Results  Component Value Date   CHOL 156 06/18/2017   HDL 41 06/18/2017   LDLCALC 94 06/18/2017   TRIG 107 06/18/2017      Wt Readings from Last 3 Encounters:  06/17/17 208 lb 8 oz (94.6 kg)  06/09/17 208 lb (94.3 kg)  05/28/17 208 lb 8 oz (94.6 kg)      ASSESSMENT AND PLAN:  CAD in native artery - Plan: EKG 12-Lead We will stop the amlodipine, start Imdur 30 Scheduled for stress test next week Restart her PPI Recommended carbonated soda, Gas-X for gas problem with belching that seems to relieve her symptoms in the evening Nitro as needed for discomfort Recent cardiac catheterization 2  months ago with moderate disease, medical management recommended Smoking cessation recommended Previously noncompliant with aspirin, recommend she stay on her aspirin  Essential hypertension - Plan: EKG 12-Lead Blood pressure is well controlled on today's visit.  Hold amlodipine, start Imdur  Chronic diastolic CHF (congestive heart failure) ([place]) - Plan: EKG 12-Lead Encouraged her to stay on her Lasix daily. Appears euvolemic Stable  Pain in both lower extremities Has chronic pain, takes medication  Mixed hyperlipidemia Encouraged her to stay on her Lipitor 40 mg daily Goal LDL less than 70  Poorly controlled type 2 diabetes mellitus with complication [facility]) She has missed several appointments with local doctors secondary to transportation issues .  Previously discussed with her at length concerning the importance of better diabetes control  Morbid obesity ([place]) We have encouraged continued exercise, careful diet management in an effort to lose weight.  Poor diet which she attributes to stress  Smoker /tobacco use   long discussion with her concerning her need to quit smoking  Reports she continues to smoke secondary to stress Does not want Chantix    Total encounter  time more than 45 minutes  Greater than 50% was spent in counseling and coordination of care with the patient   Disposition:    No orders of the defined types were placed in this encounter.    Signed, Esmond Plants, M.D., Ph.D. 09/05/2017  [place], [place] ]

## 2017-09-06 ENCOUNTER — Other Ambulatory Visit: Payer: Self-pay | Admitting: Cardiovascular Disease

## 2017-09-07 ENCOUNTER — Ambulatory Visit: Payer: Medicare Other | Admitting: Cardiovascular Disease

## 2017-10-15 IMAGING — CT CT RENAL STONE PROTOCOL
2 of 4 series · 16 of 46 positions shown, 18 images · non-contrast
Comparison: 02/19/2015 CT of the abdomen and pelvis.

CLINICAL DATA: 55 y/o F; right flank pain and history of kidney
stones.

EXAM:
CT ABDOMEN AND PELVIS WITHOUT CONTRAST
TECHNIQUE: Multidetector CT imaging of the abdomen and pelvis was performed
following the standard protocol without IV contrast.

[Series 2: stone full standard · axial · 0.88mm/px · z∈[-1024,-614]mm · 13 of 90 slices shown, 15 images]
[im 4/90  soft-tissue]
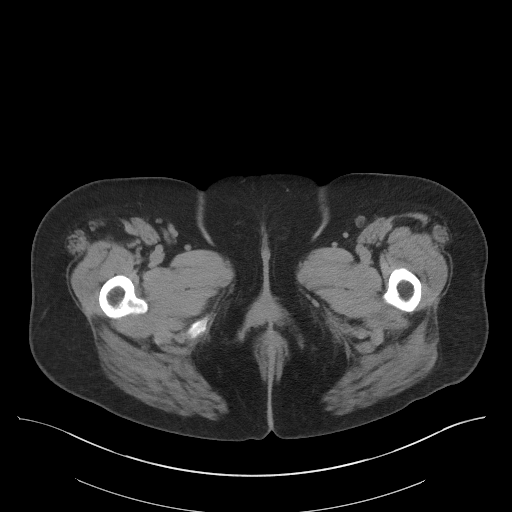
[im 4/90  bone]
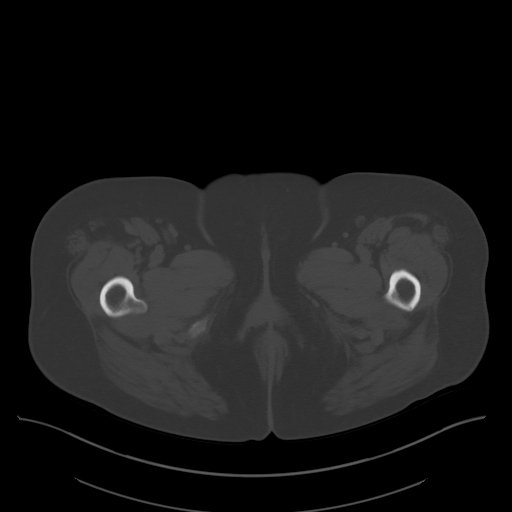
[im 12/90  soft-tissue]
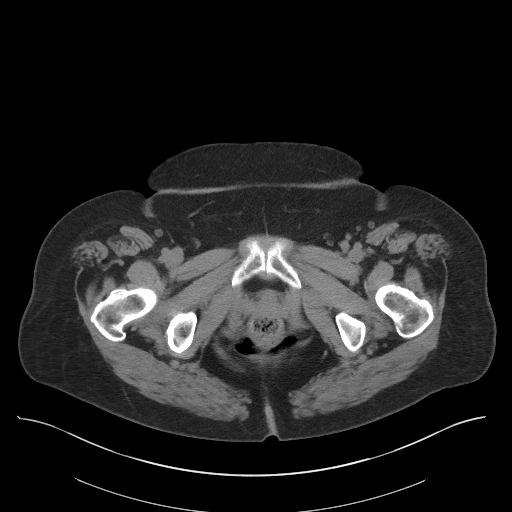
[im 19/90  soft-tissue]
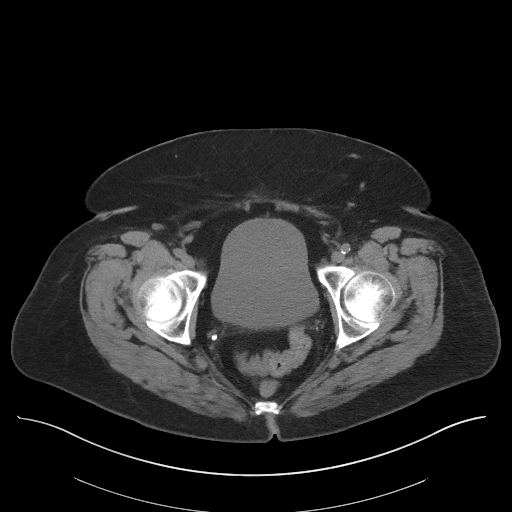
[im 26/90  soft-tissue]
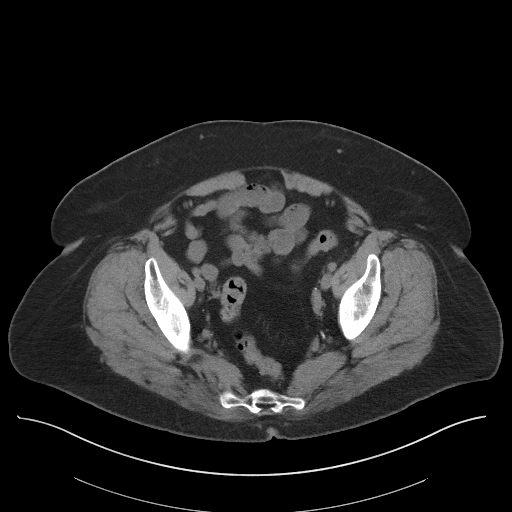
[im 30/90  soft-tissue]
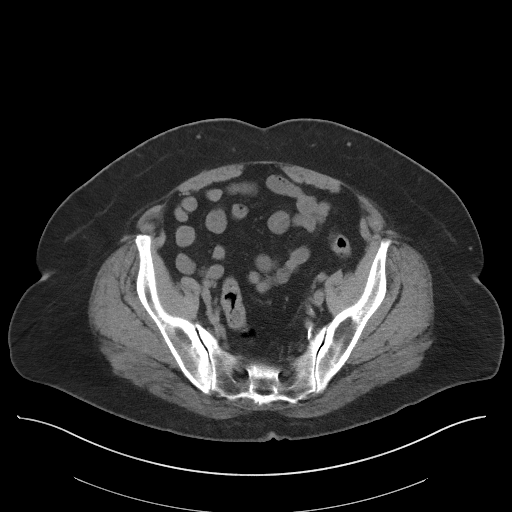
[im 38/90  soft-tissue]
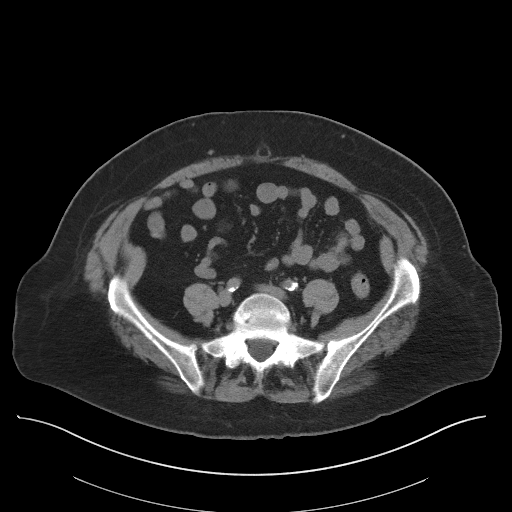
[im 45/90  soft-tissue]
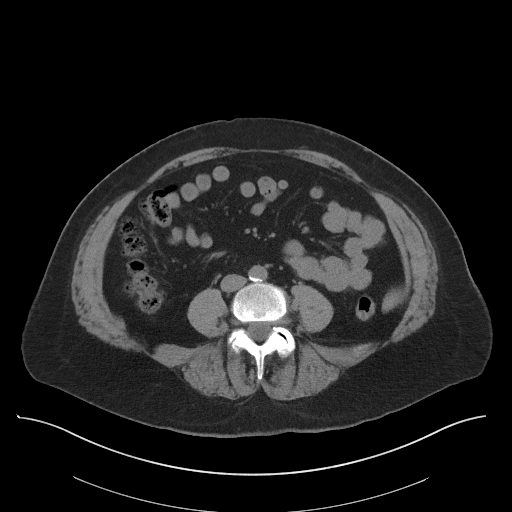
[im 52/90  soft-tissue]
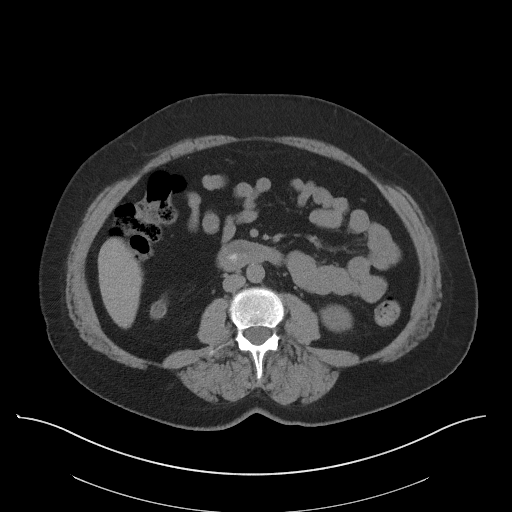
[im 60/90  soft-tissue]
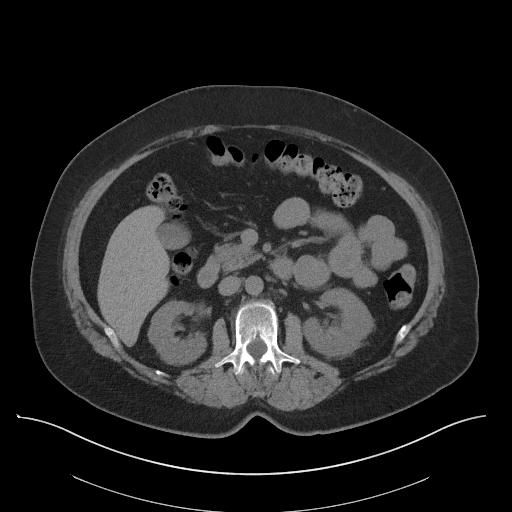
[im 60/90  bone]
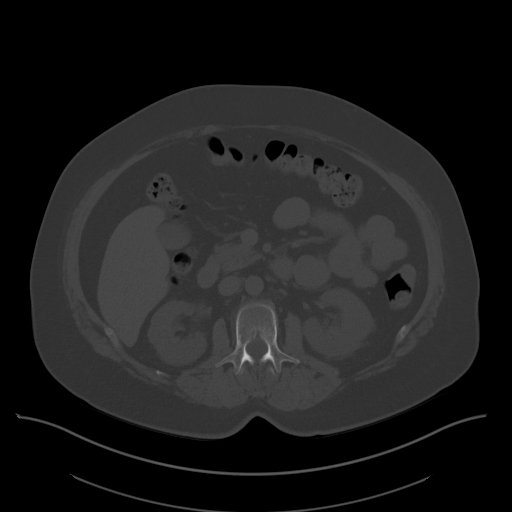
[im 64/90  soft-tissue]
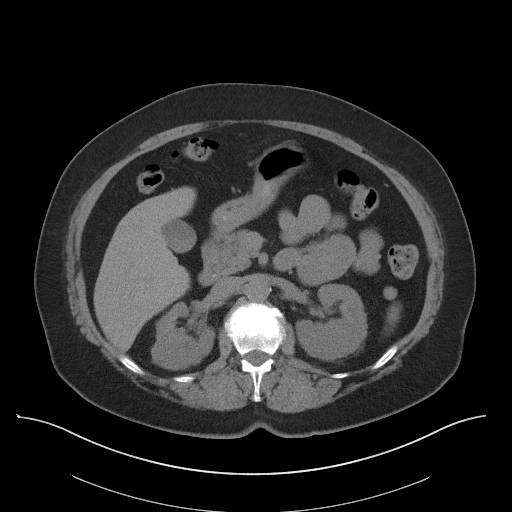
[im 71/90  soft-tissue]
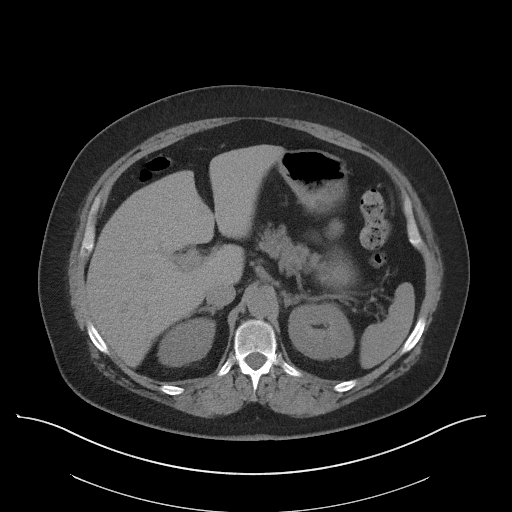
[im 78/90  soft-tissue]
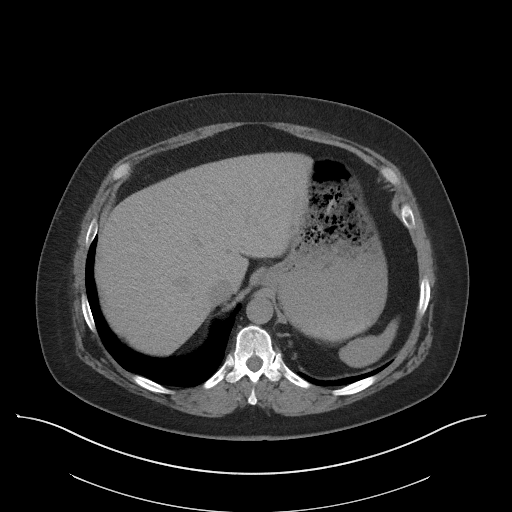
[im 86/90  soft-tissue]
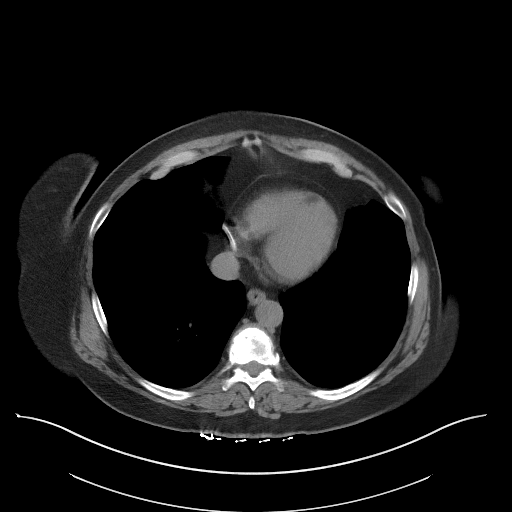

[Series 5: coronal · coronal · 0.77mm/px · 3 of 145 slices shown]
[im 49/145  soft-tissue]
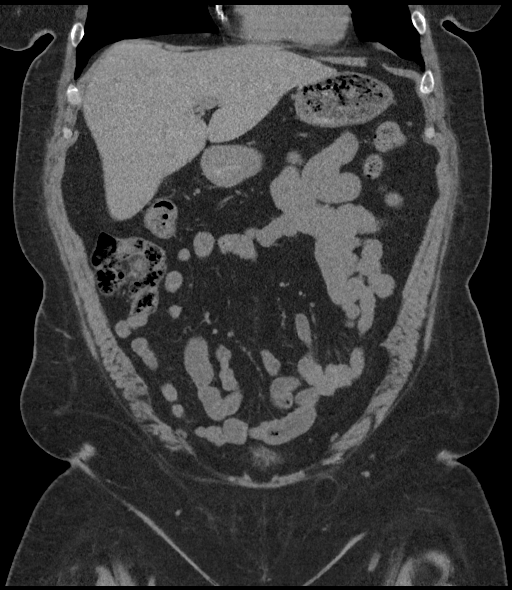
[im 65/145  soft-tissue]
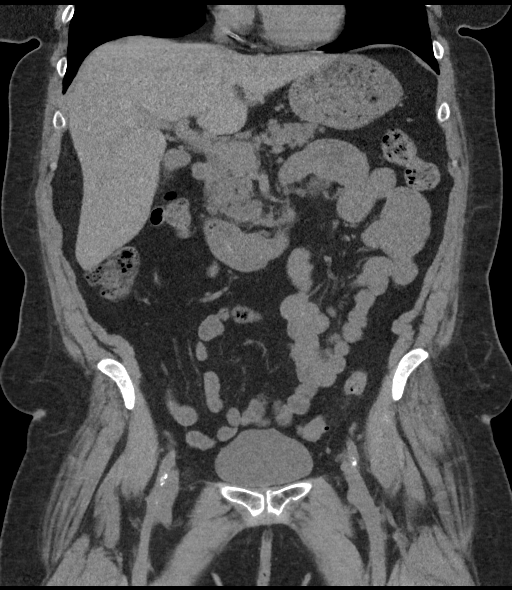
[im 81/145  soft-tissue]
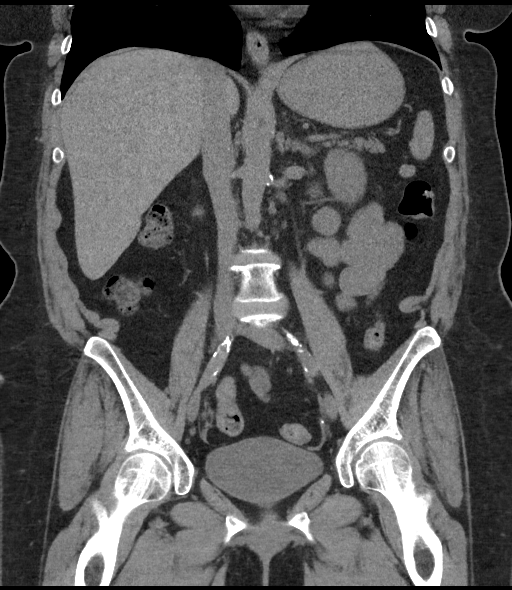

[16 of 46 positions shown; findings below may reference images not displayed]

FINDINGS: Lower chest: Coronary artery calcifications. Stable 2 mm nodule in
right middle lobe consistent with benign etiology.

Hepatobiliary: 2 subcentimeter lucencies in superior liver are
probably cysts and stable. No other focal liver abnormality is seen.
No gallstones, gallbladder wall thickening, or biliary dilatation.

Pancreas: Unremarkable. No pancreatic ductal dilatation or
surrounding inflammatory changes.

Spleen: Normal in size without focal abnormality. 12 mm splenule
inferior to spleen.

Adrenals/Urinary Tract: 12 mm left adrenal nodule measuring -6 HU
compatible with adenoma. Right adrenal nodule measuring -12 HU and
11 mm consistent with adenoma.

Left kidney interpolar punctate caliceal stone. No obstructive
uropathy or hydronephrosis. Normal bladder.

Stomach/Bowel: Stomach is within normal limits. Appendix appears
normal. No evidence of bowel wall thickening, distention, or
inflammatory changes. Scattered sigmoid diverticulosis.

Vascular/Lymphatic: Aortic atherosclerosis. No enlarged abdominal or
pelvic lymph nodes.

Reproductive: Status post hysterectomy. No adnexal masses.

Other: Small left inguinal hernia containing fat.

Musculoskeletal: Spinal spondylosis greatest at L1-2 with there are
severe discogenic changes. Lower lumbar facet arthrosis. No acute
osseous abnormality.
IMPRESSION: 1. No obstructive uropathy or hydronephrosis.
2. Punctate left kidney interpolar nonobstructing caliceal stone.
3. Bilateral adrenal adenomas are stable.
4. Aortic atherosclerosis.
5. Scattered sigmoid diverticulosis without evidence for
diverticulitis.
6. Stable small left inguinal hernia containing fat.

By: Matilda Weitzman M.D.

## 2017-10-20 ENCOUNTER — Ambulatory Visit: Payer: Medicare Other | Admitting: Cardiovascular Disease

## 2017-12-03 ENCOUNTER — Other Ambulatory Visit: Payer: Self-pay | Admitting: Cardiovascular Disease

## 2017-12-08 ENCOUNTER — Other Ambulatory Visit: Payer: Self-pay | Admitting: Cardiovascular Disease

## 2018-01-08 ENCOUNTER — Other Ambulatory Visit: Payer: Self-pay | Admitting: Physician Assistant

## 2018-01-10 ENCOUNTER — Encounter: Payer: Self-pay | Admitting: Emergency Medicine

## 2018-01-10 ENCOUNTER — Other Ambulatory Visit: Payer: Self-pay

## 2018-01-10 ENCOUNTER — Emergency Department
Admission: EM | Admit: 2018-01-10 | Discharge: 2018-01-10 | Disposition: A | Discharge status: Home / Self Care | Payer: Medicare Other | Attending: Emergency Medicine | Admitting: Emergency Medicine

## 2018-01-10 DIAGNOSIS — I110 Hypertensive heart disease with heart failure: Secondary | ICD-10-CM | POA: Diagnosis not present

## 2018-01-10 DIAGNOSIS — E860 Dehydration: Secondary | ICD-10-CM | POA: Diagnosis not present

## 2018-01-10 DIAGNOSIS — I2510 Atherosclerotic heart disease of native coronary artery without angina pectoris: Secondary | ICD-10-CM | POA: Insufficient documentation

## 2018-01-10 DIAGNOSIS — Z7982 Long term (current) use of aspirin: Secondary | ICD-10-CM | POA: Diagnosis not present

## 2018-01-10 DIAGNOSIS — N3090 Cystitis, unspecified without hematuria: Secondary | ICD-10-CM | POA: Diagnosis not present

## 2018-01-10 DIAGNOSIS — Z955 Presence of coronary angioplasty implant and graft: Secondary | ICD-10-CM | POA: Insufficient documentation

## 2018-01-10 DIAGNOSIS — F17210 Nicotine dependence, cigarettes, uncomplicated: Secondary | ICD-10-CM | POA: Diagnosis not present

## 2018-01-10 DIAGNOSIS — E119 Type 2 diabetes mellitus without complications: Secondary | ICD-10-CM | POA: Insufficient documentation

## 2018-01-10 DIAGNOSIS — R197 Diarrhea, unspecified: Secondary | ICD-10-CM | POA: Diagnosis present

## 2018-01-10 DIAGNOSIS — A09 Infectious gastroenteritis and colitis, unspecified: Secondary | ICD-10-CM | POA: Insufficient documentation

## 2018-01-10 DIAGNOSIS — I5032 Chronic diastolic (congestive) heart failure: Secondary | ICD-10-CM | POA: Diagnosis not present

## 2018-01-10 DIAGNOSIS — Z79899 Other long term (current) drug therapy: Secondary | ICD-10-CM | POA: Insufficient documentation

## 2018-01-10 LAB — CBC
HCT: 40.9 % (ref 35.0–47.0)
Hemoglobin: 13.7 g/dL (ref 12.0–16.0)
MCH: 32.9 pg (ref 26.0–34.0)
MCHC: 33.6 g/dL (ref 32.0–36.0)
MCV: 98.1 fL (ref 80.0–100.0)
PLATELETS: 311 10*3/uL (ref 150–440)
RBC: 4.17 MIL/uL (ref 3.80–5.20)
RDW: 15.9 % — ABNORMAL HIGH (ref 11.5–14.5)
WBC: 13 10*3/uL — ABNORMAL HIGH (ref 3.6–11.0)

## 2018-01-10 LAB — URINALYSIS, COMPLETE (UACMP) WITH MICROSCOPIC
Bilirubin Urine: NEGATIVE
Glucose, UA: NEGATIVE mg/dL
Ketones, ur: NEGATIVE mg/dL
Nitrite: POSITIVE — AB
Protein, ur: 100 mg/dL — AB
RBC / HPF: >50 RBC/hpf — ABNORMAL HIGH (ref 0–5)
Specific Gravity, Urine: 1.02 (ref 1.005–1.030)
WBC, UA: >50 WBC/hpf — ABNORMAL HIGH (ref 0–5)
pH: 5 (ref 5.0–8.0)

## 2018-01-10 LAB — COMPREHENSIVE METABOLIC PANEL
ALBUMIN: 4.2 g/dL (ref 3.5–5.0)
ALK PHOS: 82 U/L (ref 38–126)
ALT: 15 U/L (ref 0–44)
ANION GAP: 9 (ref 5–15)
AST: 12 U/L — ABNORMAL LOW (ref 15–41)
BILIRUBIN TOTAL: 0.3 mg/dL (ref 0.3–1.2)
BUN: 32 mg/dL — ABNORMAL HIGH (ref 6–20)
CALCIUM: 9.4 mg/dL (ref 8.9–10.3)
CO2: 23 mmol/L (ref 22–32)
Chloride: 111 mmol/L (ref 98–111)
Creatinine, Ser: 1.67 mg/dL — ABNORMAL HIGH (ref 0.44–1.00)
GFR calc non Af Amer: 34 mL/min — ABNORMAL LOW (ref >60)
GFR, EST AFRICAN AMERICAN: 39 mL/min — AB (ref >60)
GLUCOSE: 159 mg/dL — AB (ref 70–99)
Potassium: 3.5 mmol/L (ref 3.5–5.1)
Sodium: 143 mmol/L (ref 135–145)
Total Protein: 7.6 g/dL (ref 6.5–8.1)

## 2018-01-10 LAB — LACTIC ACID, PLASMA
Lactic Acid, Venous: 0.7 mmol/L (ref 0.5–1.9)
Lactic Acid, Venous: 0.6 mmol/L (ref 0.5–1.9)

## 2018-01-10 LAB — LIPASE, BLOOD: Lipase: 29 U/L (ref 11–51)

## 2018-01-10 MED ORDER — ALUM & MAG HYDROXIDE-SIMETH 200-200-20 MG/5ML PO SUSP
15.0000 mL | Freq: Once | ORAL | Status: AC
  Administered 2018-01-10: 15 mL via ORAL
  Filled 2018-01-10: qty 30

## 2018-01-10 MED ORDER — FAMOTIDINE 20 MG PO TABS
40.0000 mg | ORAL_TABLET | Freq: Once | ORAL | Status: AC
  Administered 2018-01-10: 40 mg via ORAL
  Filled 2018-01-10: qty 2

## 2018-01-10 MED ORDER — LOPERAMIDE HCL 2 MG PO TABS: 4.0000 mg | ORAL_TABLET | Freq: Four times a day (QID) | ORAL | 0 refills | Status: DC | PRN

## 2018-01-10 MED ORDER — LOPERAMIDE HCL 2 MG PO CAPS
4.0000 mg | ORAL_CAPSULE | Freq: Once | ORAL | Status: AC
  Administered 2018-01-10: 4 mg via ORAL
  Filled 2018-01-10: qty 2

## 2018-01-10 MED ORDER — SODIUM CHLORIDE 0.9 % IV SOLN
1.0000 g | Freq: Once | INTRAVENOUS | Status: AC
  Administered 2018-01-10: 1 g via INTRAVENOUS
  Filled 2018-01-10: qty 10

## 2018-01-10 MED ORDER — CEPHALEXIN 500 MG PO CAPS: 500.0000 mg | ORAL_CAPSULE | Freq: Three times a day (TID) | ORAL | 0 refills | Status: DC

## 2018-01-10 MED ORDER — SODIUM CHLORIDE 0.9 % IV BOLUS
1000.0000 mL | Freq: Once | INTRAVENOUS | Status: AC
  Administered 2018-01-10: 1000 mL via INTRAVENOUS

## 2018-01-10 MED ORDER — LIDOCAINE VISCOUS HCL 2 % MT SOLN
15.0000 mL | Freq: Once | OROMUCOSAL | Status: AC
  Administered 2018-01-10: 15 mL via OROMUCOSAL
  Filled 2018-01-10: qty 15

## 2018-01-10 MED ORDER — ONDANSETRON HCL 4 MG/2ML IJ SOLN
4.0000 mg | Freq: Once | INTRAMUSCULAR | Status: AC
  Administered 2018-01-10: 4 mg via INTRAVENOUS
  Filled 2018-01-10: qty 2

## 2018-01-10 NOTE — ED Provider Notes (Signed)
[facility] Emergency Department Provider Note  ____________________________________________  Time seen: Approximately 9:26 PM  I have reviewed the triage vital signs and the nursing notes.   HISTORY  Chief Complaint Diarrhea    HPI Jennifer [name] is a 57 y.o. female with a history of anxiety arthritis bipolar disorder CAD who complains of diarrhea for the past 3 days.  She is a month for people in her household will become sick with viral symptoms in the past 2 weeks.  No nausea or vomiting.  She has diffuse abdominal cramping pain that is nonradiating without aggravating or alleviating factors, waxing and waning.  Mild to moderate intensity.  She has urinary frequency but no dysuria.  No fevers or chills.  Tolerating oral intake just fine.      Past Medical History:  Diagnosis Date  . Anxiety   . Arthritis    "qwhere" (06/14/2014)  . Bipolar disorder ([place])   . CAD (coronary artery disease)    a. 2008 PCI/DES to RCA; b. 2012 PCI: 95% mRCA s/p PCI/DES; b. 2/16 PCI DES-> 90% dRCA; c. 11/16 PCI: dRCA ISR s/p PCI/DES;  d. 4/18 PCI: p-mRCA 95% s/p PCI/DES; e. 02/2017 Cath: LML nl, LAD nl, D1 40, LCX min irregs, OM1 60, RCA 50-60p/m ISR, 54md, patent dRCA stent, RPDA 90ost (fills via L->R collats), EF 55-65%.  . Chronic bronchitis ([place]    "get it q yr"  . Daily headache    "when my blood pressure is up" (06/14/2014)  . Depression   . Diastolic dysfunction    a. 02/2017 Echo: EF 55-60%, no rwma, Gr1 DD, nl RV fxn.  .Marland KitchenGERD (gastroesophageal reflux disease)   . Heart murmur   . High cholesterol   . History of stomach ulcers   . Hypertension   . Insomnia   . Kidney stones   . Migraine    "haven't had them in a good while; did get them 1-2 times/yr" (06/14/2014)  . Myocardial infarction ([place] 2008; ~ 2012  . Pneumonia    "get it 1-2 times/year" (06/14/2014)  . RLS (restless legs syndrome)   . Seizures ([facility]   . Sleep apnea    "they want me to do the lab  but I haven't" (06/14/2014)  . Stroke ([facility]    "they say I've had several" (06/14/2014)  . Tobacco abuse   . Type II diabetes mellitus ([place]   . Urinary, incontinence, stress female      Patient Active Problem List   Diagnosis Date Noted  . Abnormal stress test 06/09/2017  . Unstable angina ([place] 08/03/2016  . Morbid obesity ([place] 05/02/2016  . Chest pain with moderate risk for cardiac etiology 03/14/2015  . CAD (coronary artery disease)   . Hospital discharge follow-up 03/07/2015  . Poorly controlled type 2 diabetes mellitus with complication ([place] 057/[id number] . Hyperlipidemia 06/22/2014  . Stented coronary artery   . Chronic pain syndrome   . Chest pain at rest 06/14/2014  . Acute bronchitis 06/14/2014  . Tobacco abuse 06/14/2014  . Essential hypertension 06/14/2014  . GERD (gastroesophageal reflux disease) 06/14/2014  . Bipolar disorder ([place] 06/14/2014  . Chest pain 06/14/2014     Past Surgical History:  Procedure Laterality Date  . ABDOMINAL HYSTERECTOMY  1984   "I've got 1 ovary left"  . CARDIAC CATHETERIZATION N/A 02/26/2015   Procedure: Left Heart Cath and Coronary Angiography;  Surgeon: MWellington Hampshire MD;  [facility];  Service: Cardiovascular;  Laterality:  N/A;  . CARDIAC CATHETERIZATION N/A 02/26/2015   Procedure: Coronary Stent Intervention;  Surgeon: Wellington Hampshire, MD;  Location: [place] [facility] LAB;  Service: Cardiovascular;  Laterality: N/A;  . CESAREAN SECTION  1984  . CORONARY ANGIOPLASTY WITH STENT PLACEMENT  2008; ~ 2012   "1; 1"  . CORONARY BALLOON ANGIOPLASTY N/A 06/09/2017   Procedure: CORONARY BALLOON ANGIOPLASTY;  Surgeon: Nelva Bush, MD;  Location: [place] [facility] LAB;  Service: Cardiovascular;  Laterality: N/A;  . CORONARY STENT INTERVENTION N/A 08/04/2016   Procedure: Coronary Stent Intervention;  Surgeon: Wellington Hampshire, MD;  Location: [place] [facility];  Service: Cardiovascular;  Laterality: N/A;  . DILATION  AND CURETTAGE OF UTERUS    . EXTRACORPOREAL SHOCK WAVE LITHOTRIPSY  X 2  . KNEE ARTHROSCOPY Left   . LEFT HEART CATH Bilateral 08/04/2016   Procedure: Left Heart Cath poss PCI;  Surgeon: Wellington Hampshire, MD;  Location: [place] [facility] LAB;  Service: Cardiovascular;  Laterality: Bilateral;  . LEFT HEART CATH AND CORONARY ANGIOGRAPHY Left 02/19/2017   Procedure: LEFT HEART CATH AND CORONARY ANGIOGRAPHY;  Surgeon: Minna Merritts, MD;  Location: [place] [facility] LAB;  Service: Cardiovascular;  Laterality: Left;  . LEFT HEART CATH AND CORONARY ANGIOGRAPHY Left 06/09/2017   Procedure: LEFT HEART CATH AND CORONARY ANGIOGRAPHY;  Surgeon: Nelva Bush, MD;  Location: [place] [facility] LAB;  Service: Cardiovascular;  Laterality: Left;  . LEFT HEART CATHETERIZATION WITH CORONARY ANGIOGRAM N/A 06/15/2014   Procedure: LEFT HEART CATHETERIZATION WITH CORONARY ANGIOGRAM;  Surgeon: Jettie Booze, MD;  Location: [facility] C[facility] LAB;  Service: Cardiovascular;  Laterality: N/A;  . PERCUTANEOUS CORONARY STENT INTERVENTION (PCI-S)  06/15/2014   Procedure: PERCUTANEOUS CORONARY STENT INTERVENTION (PCI-S);  Surgeon: Jettie Booze, MD;  Location: [facility] CATH LAB;  Service: Cardiovascular;;  . TONSILLECTOMY    . TUBAL LIGATION       Prior to Admission medications   Medication Sig Start Date End Date Taking? Authorizing Provider  albuterol (PROVENTIL HFA;VENTOLIN HFA) 108 (90 Base) MCG/ACT inhaler Inhale 1-2 puffs into the lungs every 4 (four) hours as needed for wheezing or shortness of breath.  02/11/17 02/11/18  [provider]  aspirin EC 81 MG EC tablet Take 1 tablet (81 mg total) by mouth daily. 02/27/15   Gladstone Lighter, MD  atorvastatin (LIPITOR) 80 MG tablet Take 1 tablet (80 mg total) by mouth daily. 06/22/17 09/20/17  Rise Mu, PA-C  Blood Glucose Monitoring Suppl W/DEVICE KIT Test 1-2 times daily; E11.65 diagnosis 03/02/15   Rubbie Battiest, RN  cephALEXin (KEFLEX) 500 MG capsule Take 1  capsule (500 mg total) by mouth 3 (three) times daily. 01/10/18   Carrie Mew, MD  clopidogrel (PLAVIX) 75 MG tablet Take 75 mg by mouth once daily with breakfast 09/07/17   Minna Merritts, MD  cyclobenzaprine (FLEXERIL) 10 MG tablet Take 10 mg by mouth 3 (three) times daily as needed for muscle spasms.  07/17/16   [provider]  furosemide (LASIX) 20 MG tablet TAKE 1 TABLET BY MOUTH EVERY DAY 12/08/17   Minna Merritts, MD  gabapentin (NEURONTIN) 600 MG tablet Take 900 mg by mouth 4 times daily 02/27/17   [provider]  Insulin Glargine (BASAGLAR KWIKPEN) 100 UNIT/ML SOPN Inject 20 units SQ in the morning and inject 50 units SQ at bedtime 03/04/17   [provider]  isosorbide mononitrate (IMDUR) 30 MG 24 hr tablet Take 1 tablet (30 mg total) by mouth daily.  05/28/17   Minna Merritts, MD  liraglutide (VICTOZA) 18 MG/3ML SOPN Inject 1.2 mg SQ in the morning 02/20/17   [provider]  lisinopril (PRINIVIL,ZESTRIL) 40 MG tablet Take 1 tablet (40 mg total) by mouth daily. 08/17/17   Minna Merritts, MD  loperamide (IMODIUM A-D) 2 MG tablet Take 2 tablets (4 mg total) by mouth 4 (four) times daily as needed for diarrhea or loose stools. 01/10/18   Carrie Mew, MD  metoprolol (TOPROL-XL) 200 MG 24 hr tablet Take 200 mg by mouth once daily 08/17/17   Minna Merritts, MD  nitroGLYCERIN (NITROSTAT) 0.4 MG SL tablet Place 1 tablet (0.4 mg total) under the tongue every 5 (five) minutes as needed for chest pain (Do not give more than 3 SL tablets in 15 minutes.). 08/05/16   Fritzi Mandes, MD  pantoprazole (PROTONIX) 40 MG tablet TAKE 1 TABLET(40 MG) BY MOUTH DAILY 12/03/17   Dunn, Areta Haber, PA-C  polyethylene glycol [facility] / GLYCOLAX) packet Take 17 g by mouth daily as needed for mild constipation.    [provider]  ranolazine (RANEXA) 1000 MG SR tablet Take 1 tablet (1,000 mg) by mouth TWICE daily 06/17/17   Rise Mu, PA-C      Allergies Carbamazepine; Lithium; Codeine; Levofloxacin in d5w; Methocarbamol; Tape; Lodine [etodolac]; and Relafen [nabumetone]   Family History  Problem Relation Age of Onset  . Lung cancer Mother   . Cirrhosis Father     Social History Social History   Tobacco Use  . Smoking status: Current Every Day Smoker    Packs/day: 0.25    Years: 35.00    Pack years: 8.75    Types: Cigarettes  . Smokeless tobacco: Never Used  . Tobacco comment: Patches  Substance Use Topics  . Alcohol use: Yes    Alcohol/week: 0.0 standard drinks    Comment: 06/14/2014 "might drink twice/yr"  . Drug use: Yes    Types: Marijuana    Review of Systems  Constitutional:   No fever or chills.  ENT:   No sore throat. No rhinorrhea. Cardiovascular:   No chest pain or syncope. Respiratory:   No dyspnea or cough. Gastrointestinal:   Positive as above for abdominal pain and diarrhea.  No black or bloody stool.  Musculoskeletal:   Negative for focal pain or swelling All other systems reviewed and are negative except as documented above in ROS and HPI.  ____________________________________________   PHYSICAL EXAM:  VITAL SIGNS: ED Triage Vitals  Enc Vitals Group     BP 01/10/18 1703 108/76     Pulse Rate 01/10/18 1703 (!) 126     Resp 01/10/18 1703 16     Temp 01/10/18 1703 98.6 F (37 C)     Temp Source 01/10/18 1703 Oral     SpO2 01/10/18 1703 97 %     Weight 01/10/18 1704 200 lb (90.7 kg)     Height 01/10/18 1704 _0  (1.575 m)     Head Circumference --      Peak Flow --      Pain Score 01/10/18 1703 6     Pain Loc --      Pain Edu? --      Excl. in [place]? --     Vital signs reviewed, nursing assessments reviewed.   Constitutional:   Alert and oriented. Non-toxic appearance. Eyes:   Conjunctivae are normal. EOMI. PERRL. ENT      Head:   Normocephalic and atraumatic.  Nose:   No congestion/rhinnorhea.       Mouth/Throat:   MMM, no pharyngeal erythema. No peritonsillar  mass.       Neck:   No meningismus. Full ROM. Hematological/Lymphatic/Immunilogical:   No cervical lymphadenopathy. Cardiovascular:   Tachycardia heart rate 120. Symmetric bilateral radial and DP pulses.  No murmurs. Cap refill less than 2 seconds. Respiratory:   Normal respiratory effort without tachypnea/retractions. Breath sounds are clear and equal bilaterally. No wheezes/rales/rhonchi. Gastrointestinal:   Soft without focal tenderness.  Non distended. There is no CVA tenderness.  No rebound, rigidity, or guarding. Musculoskeletal:   Normal range of motion in all extremities. No joint effusions.  No lower extremity tenderness.  No edema. Neurologic:   Normal speech and language.  Motor grossly intact. No acute focal neurologic deficits are appreciated.  Skin:    Skin is warm, dry and intact. No rash noted.  No petechiae, purpura, or bullae.  ____________________________________________    LABS (pertinent positives/negatives) (all labs ordered are listed, but only abnormal results are displayed) Labs Reviewed  COMPREHENSIVE METABOLIC PANEL - Abnormal; Notable for the following components:      Result Value   Glucose, Bld 159 (*)    BUN 32 (*)    Creatinine, Ser 1.67 (*)    AST 12 (*)    GFR calc non Af Amer 34 (*)    GFR calc Af Amer 39 (*)    All other components within normal limits  CBC - Abnormal; Notable for the following components:   WBC 13.0 (*)    RDW 15.9 (*)    All other components within normal limits  URINALYSIS, COMPLETE (UACMP) WITH MICROSCOPIC - Abnormal; Notable for the following components:   Color, Urine AMBER (*)    APPearance TURBID (*)    Hgb urine dipstick LARGE (*)    Protein, ur 100 (*)    Nitrite POSITIVE (*)    Leukocytes, UA MODERATE (*)    RBC / HPF >50 (*)    WBC, UA >50 (*)    Bacteria, UA RARE (*)    All other components within normal limits  URINE CULTURE  LIPASE, BLOOD  LACTIC ACID, PLASMA  LACTIC ACID, PLASMA    ____________________________________________   EKG    ____________________________________________    RADIOLOGY  No results found.  ____________________________________________   PROCEDURES Procedures  ____________________________________________    CLINICAL IMPRESSION / ASSESSMENT AND PLAN / ED COURSE  Pertinent labs & imaging results that were available during my care of the patient were reviewed by me and considered in my medical decision making (see chart for details).    Patient presents with symptoms consistent with viral enteritis.  Doubt mesenteric ischemia appendicitis cholecystitis biliary disease pancreatitis bowel perforation or obstruction.  She is nontoxic.  Labs do show evidence of acute renal insufficiency with tachycardia and GI losses this is consistent with dehydration.  IV fluids given for symptom relief and rehydration.  Imodium for symptom relief as well.  Urinalysis does show a clear urinary tract infection.  I have added on a urine culture, given IV ceftriaxone, and prescribed Keflex.  Follow-up with primary care this week.  She is tolerating oral intake and suitable for outpatient management      ____________________________________________   FINAL CLINICAL IMPRESSION(S) / ED DIAGNOSES    Final diagnoses:  Diarrhea of infectious origin  Cystitis  Dehydration     ED Discharge Orders         Ordered    cephALEXin (  KEFLEX) 500 MG capsule  3 times daily     01/10/18 2124    loperamide (IMODIUM A-D) 2 MG tablet  4 times daily PRN     01/10/18 2125          Portions of this note were generated with dragon dictation software. Dictation errors may occur despite best attempts at proofreading.    Carrie Mew, MD 01/10/18 2137

## 2018-01-10 NOTE — Discharge Instructions (Signed)
Your urine test shows a urinary tract infection. Take keflex as prescribed to treat this infection. Please follow up with your doctor for continued monitoring of your symptoms. Over the counter antacids such as Maalox or Pepcid can be helpful for your symptoms as well.

## 2018-01-10 NOTE — ED Notes (Signed)
Pt has tolerated oral ginger ale. No diarrhea since this RN took over care at 7 pm.

## 2018-01-10 NOTE — ED Triage Notes (Signed)
3 day history of diarrhea.  Symptoms accompanied by abdominal cramping.

## 2018-01-11 ENCOUNTER — Telehealth: Payer: Self-pay

## 2018-01-11 NOTE — Telephone Encounter (Signed)
L MOM to call and schedule past due f/u

## 2018-01-11 NOTE — Telephone Encounter (Signed)
-----   Message from Festus AloeSharon G Crespo, CMA sent at 01/11/2018  9:20 AM EDT ----- Please contact patient for a follow up.  The patient was to see Dr. Mariah MillingGollan in Aug. 2019. Armen Pickuphank, Sharon

## 2018-01-13 LAB — URINE CULTURE: Culture: >=100000 — AB

## 2018-01-19 NOTE — Telephone Encounter (Signed)
Patient has switched over to [facility] for her care

## 2018-02-06 ENCOUNTER — Encounter: Payer: Self-pay | Admitting: Emergency Medicine

## 2018-02-06 ENCOUNTER — Inpatient Hospital Stay
Admission: EM | Admit: 2018-02-06 | Discharge: 2018-02-10 | DRG: 872 | Disposition: A | Discharge status: Home / Self Care | Payer: Medicare Other | Attending: Internal Medicine | Admitting: Internal Medicine

## 2018-02-06 DIAGNOSIS — I5032 Chronic diastolic (congestive) heart failure: Secondary | ICD-10-CM | POA: Diagnosis present

## 2018-02-06 DIAGNOSIS — I2510 Atherosclerotic heart disease of native coronary artery without angina pectoris: Secondary | ICD-10-CM | POA: Diagnosis present

## 2018-02-06 DIAGNOSIS — L539 Erythematous condition, unspecified: Secondary | ICD-10-CM | POA: Diagnosis not present

## 2018-02-06 DIAGNOSIS — L03116 Cellulitis of left lower limb: Secondary | ICD-10-CM | POA: Diagnosis present

## 2018-02-06 DIAGNOSIS — I252 Old myocardial infarction: Secondary | ICD-10-CM

## 2018-02-06 DIAGNOSIS — Z8673 Personal history of transient ischemic attack (TIA), and cerebral infarction without residual deficits: Secondary | ICD-10-CM

## 2018-02-06 DIAGNOSIS — Z87442 Personal history of urinary calculi: Secondary | ICD-10-CM

## 2018-02-06 DIAGNOSIS — A419 Sepsis, unspecified organism: Secondary | ICD-10-CM | POA: Diagnosis present

## 2018-02-06 DIAGNOSIS — I10 Essential (primary) hypertension: Secondary | ICD-10-CM | POA: Diagnosis present

## 2018-02-06 DIAGNOSIS — Z6838 Body mass index (BMI) 38.0-38.9, adult: Secondary | ICD-10-CM

## 2018-02-06 DIAGNOSIS — E7800 Pure hypercholesterolemia, unspecified: Secondary | ICD-10-CM | POA: Diagnosis present

## 2018-02-06 DIAGNOSIS — G4730 Sleep apnea, unspecified: Secondary | ICD-10-CM | POA: Diagnosis present

## 2018-02-06 DIAGNOSIS — Z955 Presence of coronary angioplasty implant and graft: Secondary | ICD-10-CM

## 2018-02-06 DIAGNOSIS — Z886 Allergy status to analgesic agent status: Secondary | ICD-10-CM

## 2018-02-06 DIAGNOSIS — J449 Chronic obstructive pulmonary disease, unspecified: Secondary | ICD-10-CM | POA: Diagnosis present

## 2018-02-06 DIAGNOSIS — E1165 Type 2 diabetes mellitus with hyperglycemia: Secondary | ICD-10-CM | POA: Diagnosis present

## 2018-02-06 DIAGNOSIS — Z8711 Personal history of peptic ulcer disease: Secondary | ICD-10-CM

## 2018-02-06 DIAGNOSIS — L02416 Cutaneous abscess of left lower limb: Secondary | ICD-10-CM | POA: Diagnosis present

## 2018-02-06 DIAGNOSIS — Z888 Allergy status to other drugs, medicaments and biological substances status: Secondary | ICD-10-CM

## 2018-02-06 DIAGNOSIS — Z885 Allergy status to narcotic agent status: Secondary | ICD-10-CM

## 2018-02-06 DIAGNOSIS — Z90710 Acquired absence of both cervix and uterus: Secondary | ICD-10-CM

## 2018-02-06 DIAGNOSIS — I110 Hypertensive heart disease with heart failure: Secondary | ICD-10-CM | POA: Diagnosis present

## 2018-02-06 DIAGNOSIS — Z7982 Long term (current) use of aspirin: Secondary | ICD-10-CM

## 2018-02-06 DIAGNOSIS — E118 Type 2 diabetes mellitus with unspecified complications: Secondary | ICD-10-CM

## 2018-02-06 DIAGNOSIS — K219 Gastro-esophageal reflux disease without esophagitis: Secondary | ICD-10-CM | POA: Diagnosis present

## 2018-02-06 DIAGNOSIS — F17210 Nicotine dependence, cigarettes, uncomplicated: Secondary | ICD-10-CM | POA: Diagnosis present

## 2018-02-06 DIAGNOSIS — L0390 Cellulitis, unspecified: Secondary | ICD-10-CM | POA: Diagnosis present

## 2018-02-06 DIAGNOSIS — Z881 Allergy status to other antibiotic agents status: Secondary | ICD-10-CM

## 2018-02-06 DIAGNOSIS — Z7902 Long term (current) use of antithrombotics/antiplatelets: Secondary | ICD-10-CM

## 2018-02-06 DIAGNOSIS — E785 Hyperlipidemia, unspecified: Secondary | ICD-10-CM | POA: Diagnosis present

## 2018-02-06 DIAGNOSIS — G2581 Restless legs syndrome: Secondary | ICD-10-CM | POA: Diagnosis present

## 2018-02-06 DIAGNOSIS — M1990 Unspecified osteoarthritis, unspecified site: Secondary | ICD-10-CM | POA: Diagnosis present

## 2018-02-06 DIAGNOSIS — N393 Stress incontinence (female) (male): Secondary | ICD-10-CM | POA: Diagnosis present

## 2018-02-06 DIAGNOSIS — Z9109 Other allergy status, other than to drugs and biological substances: Secondary | ICD-10-CM

## 2018-02-06 DIAGNOSIS — Z79899 Other long term (current) drug therapy: Secondary | ICD-10-CM

## 2018-02-06 DIAGNOSIS — T63301A Toxic effect of unspecified spider venom, accidental (unintentional), initial encounter: Secondary | ICD-10-CM | POA: Diagnosis present

## 2018-02-06 LAB — URINALYSIS, COMPLETE (UACMP) WITH MICROSCOPIC
Bilirubin Urine: NEGATIVE
Glucose, UA: NEGATIVE mg/dL
Hgb urine dipstick: NEGATIVE
Ketones, ur: NEGATIVE mg/dL
Leukocytes, UA: NEGATIVE
Nitrite: NEGATIVE
Protein, ur: NEGATIVE mg/dL
Specific Gravity, Urine: 1.024 (ref 1.005–1.030)
pH: 5 (ref 5.0–8.0)

## 2018-02-06 LAB — CBC WITH DIFFERENTIAL/PLATELET
Abs Immature Granulocytes: 0.15 10*3/uL — ABNORMAL HIGH (ref 0.00–0.07)
Basophils Absolute: 0.1 10*3/uL (ref 0.0–0.1)
Basophils Relative: 0 %
Eosinophils Absolute: 0.2 10*3/uL (ref 0.0–0.5)
Eosinophils Relative: 1 %
HCT: 39.2 % (ref 36.0–46.0)
Hemoglobin: 12.7 g/dL (ref 12.0–15.0)
Immature Granulocytes: 1 %
Lymphocytes Relative: 12 %
Lymphs Abs: 2.7 10*3/uL (ref 0.7–4.0)
MCH: 31.4 pg (ref 26.0–34.0)
MCHC: 32.4 g/dL (ref 30.0–36.0)
MCV: 96.8 fL (ref 80.0–100.0)
Monocytes Absolute: 0.7 10*3/uL (ref 0.1–1.0)
Monocytes Relative: 3 %
Neutro Abs: 17.7 10*3/uL — ABNORMAL HIGH (ref 1.7–7.7)
Neutrophils Relative %: 83 %
Platelets: 295 10*3/uL (ref 150–400)
RBC: 4.05 MIL/uL (ref 3.87–5.11)
RDW: 15.1 % (ref 11.5–15.5)
WBC: 21.6 10*3/uL — ABNORMAL HIGH (ref 4.0–10.5)
nRBC: 0 % (ref 0.0–0.2)

## 2018-02-06 LAB — COMPREHENSIVE METABOLIC PANEL
ALT: 15 U/L (ref 0–44)
AST: 14 U/L — ABNORMAL LOW (ref 15–41)
Albumin: 3.7 g/dL (ref 3.5–5.0)
Alkaline Phosphatase: 69 U/L (ref 38–126)
Anion gap: 9 (ref 5–15)
BUN: 22 mg/dL — ABNORMAL HIGH (ref 6–20)
CO2: 25 mmol/L (ref 22–32)
Calcium: 9 mg/dL (ref 8.9–10.3)
Chloride: 105 mmol/L (ref 98–111)
Creatinine, Ser: 0.98 mg/dL (ref 0.44–1.00)
GFR calc Af Amer: >60 mL/min (ref >60)
GFR calc non Af Amer: >60 mL/min (ref >60)
Glucose, Bld: 228 mg/dL — ABNORMAL HIGH (ref 70–99)
Potassium: 3.9 mmol/L (ref 3.5–5.1)
Sodium: 139 mmol/L (ref 135–145)
Total Bilirubin: 0.6 mg/dL (ref 0.3–1.2)
Total Protein: 7.2 g/dL (ref 6.5–8.1)

## 2018-02-06 MED ORDER — CLINDAMYCIN PHOSPHATE 600 MG/50ML IV SOLN
600.0000 mg | Freq: Once | INTRAVENOUS | Status: AC
  Administered 2018-02-06: 600 mg via INTRAVENOUS
  Filled 2018-02-06: qty 50

## 2018-02-06 MED ORDER — HYDROCODONE-ACETAMINOPHEN 5-325 MG PO TABS
1.0000 | ORAL_TABLET | Freq: Once | ORAL | Status: AC
  Administered 2018-02-06: 1 via ORAL
  Filled 2018-02-06: qty 1

## 2018-02-06 MED ORDER — ONDANSETRON 4 MG PO TBDP
4.0000 mg | ORAL_TABLET | Freq: Once | ORAL | Status: AC
  Administered 2018-02-06: 4 mg via ORAL
  Filled 2018-02-06: qty 1

## 2018-02-06 NOTE — ED Provider Notes (Signed)
Seen in conjunction with PA, significant cellulitis to the left inner thigh, no fluctuance or drainable collection on my exam.  Significant elevated white blood cell count with tachycardia, elevated temperature with a history of diabetes.  Has received clindamycin in the emergency department, recommend admission for further treatment, patient agrees   Jene Every, MD 02/06/18 2310

## 2018-02-06 NOTE — ED Provider Notes (Signed)
[facility] Emergency Department Provider Note  ____________________________________________  Time seen: Approximately 11:24 PM  I have reviewed the triage vital signs and the nursing notes.   HISTORY  Chief Complaint Wound Check    HPI Jennifer [name] is a 57 y.o. female with a history of diabetes, CAD and hypertension, presents to the emergency department with fever, chills and left inner thigh cellulitis that has been present and progressively worsening  for the past 3 days.  Patient was cleaning some materials out of her garage when she felt a stinging sensation to left inner thigh.  Patient assumed that she had been stung by an insect.  Patient did not see any type of insect.  Patient reports that she has noticed progressively worsening cellulitis over the course of 3 days.  She has attempted self-expression multiple times which has not yielded purulent exudate.  Patient has a history of skin complications with diabetes. No alleviating measures have been attempted.    Past Medical History:  Diagnosis Date  . Anxiety   . Arthritis    "qwhere" (06/14/2014)  . Bipolar disorder ([place])   . CAD (coronary artery disease)    a. 2008 PCI/DES to RCA; b. 2012 PCI: 95% mRCA s/p PCI/DES; b. 2/16 PCI DES-> 90% dRCA; c. 11/16 PCI: dRCA ISR s/p PCI/DES;  d. 4/18 PCI: p-mRCA 95% s/p PCI/DES; e. 02/2017 Cath: LML nl, LAD nl, D1 40, LCX min irregs, OM1 60, RCA 50-60p/m ISR, 4md, patent dRCA stent, RPDA 90ost (fills via L->R collats), EF 55-65%.  . Chronic bronchitis ([employer]    "get it q yr"  . Daily headache    "when my blood pressure is up" (06/14/2014)  . Depression   . Diastolic dysfunction    a. 02/2017 Echo: EF 55-60%, no rwma, Gr1 DD, nl RV fxn.  .Marland KitchenGERD (gastroesophageal reflux disease)   . Heart murmur   . High cholesterol   . History of stomach ulcers   . Hypertension   . Insomnia   . Kidney stones   . Migraine    "haven't had them in a good while; did get them  1-2 times/yr" (06/14/2014)  . Myocardial infarction ([place] 2008; ~ 2012  . Pneumonia    "get it 1-2 times/year" (06/14/2014)  . RLS (restless legs syndrome)   . Seizures ([place]   . Sleep apnea    "they want me to do the lab but I haven't" (06/14/2014)  . Stroke ([facility]    "they say I've had several" (06/14/2014)  . Tobacco abuse   . Type II diabetes mellitus ([place]   . Urinary, incontinence, stress female     Patient Active Problem List   Diagnosis Date Noted  . Cellulitis 02/06/2018  . Sepsis ([place] 02/06/2018  . Abnormal stress test 06/09/2017  . Unstable angina ([place] 08/03/2016  . Morbid obesity ([place] 05/02/2016  . Chest pain with moderate risk for cardiac etiology 03/14/2015  . CAD (coronary artery disease)   . Hospital discharge follow-up 03/07/2015  . Poorly controlled type 2 diabetes mellitus with complication ([place] [id number] . Hyperlipidemia 06/22/2014  . Stented coronary artery   . Chronic pain syndrome   . Chest pain at rest 06/14/2014  . Acute bronchitis 06/14/2014  . Tobacco abuse 06/14/2014  . Essential hypertension 06/14/2014  . GERD (gastroesophageal reflux disease) 06/14/2014  . Bipolar disorder ([place] 06/14/2014  . Chest pain 06/14/2014    Past Surgical History:  Procedure Laterality Date  . ABDOMINAL HYSTERECTOMY  1984   "  I've got 1 ovary left"  . CARDIAC CATHETERIZATION N/A 02/26/2015   Procedure: Left Heart Cath and Coronary Angiography;  Surgeon: Wellington Hampshire, MD;  Location: [place] CV LAB;  Service: Cardiovascular;  Laterality: N/A;  . CARDIAC CATHETERIZATION N/A 02/26/2015   Procedure: Coronary Stent Intervention;  Surgeon: Wellington Hampshire, MD;  Location: [place] [facility];  Service: Cardiovascular;  Laterality: N/A;  . CESAREAN SECTION  1984  . CORONARY ANGIOPLASTY WITH STENT PLACEMENT  2008; ~ 2012   "1; 1"  . CORONARY BALLOON ANGIOPLASTY N/A 06/09/2017   Procedure: CORONARY BALLOON ANGIOPLASTY;  Surgeon: Nelva Bush, MD;  Location:  [place] [facility] LAB;  Service: Cardiovascular;  Laterality: N/A;  . CORONARY STENT INTERVENTION N/A 08/04/2016   Procedure: Coronary Stent Intervention;  Surgeon: Wellington Hampshire, MD;  Location: [place] [facility];  Service: Cardiovascular;  Laterality: N/A;  . DILATION AND CURETTAGE OF UTERUS    . EXTRACORPOREAL SHOCK WAVE LITHOTRIPSY  X 2  . KNEE ARTHROSCOPY Left   . LEFT HEART CATH Bilateral 08/04/2016   Procedure: Left Heart Cath poss PCI;  Surgeon: Wellington Hampshire, MD;  Location: [place] [facility];  Service: Cardiovascular;  Laterality: Bilateral;  . LEFT HEART CATH AND CORONARY ANGIOGRAPHY Left 02/19/2017   Procedure: LEFT HEART CATH AND CORONARY ANGIOGRAPHY;  Surgeon: Minna Merritts, MD;  Location: [place] CV LAB;  Service: Cardiovascular;  Laterality: Left;  . LEFT HEART CATH AND CORONARY ANGIOGRAPHY Left 06/09/2017   Procedure: LEFT HEART CATH AND CORONARY ANGIOGRAPHY;  Surgeon: Nelva Bush, MD;  Location: [place] [facility] LAB;  Service: Cardiovascular;  Laterality: Left;  . LEFT HEART CATHETERIZATION WITH CORONARY ANGIOGRAM N/A 06/15/2014   Procedure: LEFT HEART CATHETERIZATION WITH CORONARY ANGIOGRAM;  Surgeon: Jettie Booze, MD;  Location: [facility] CATH LAB;  Service: Cardiovascular;  Laterality: N/A;  . PERCUTANEOUS CORONARY STENT INTERVENTION (PCI-S)  06/15/2014   Procedure: PERCUTANEOUS CORONARY STENT INTERVENTION (PCI-S);  Surgeon: Jettie Booze, MD;  Location: [facility] CATH LAB;  Service: Cardiovascular;;  . TONSILLECTOMY    . TUBAL LIGATION      Prior to Admission medications   Medication Sig Start Date End Date Taking? Authorizing Provider  albuterol (PROVENTIL HFA;VENTOLIN HFA) 108 (90 Base) MCG/ACT inhaler Inhale 1-2 puffs into the lungs every 4 (four) hours as needed for wheezing or shortness of breath.  02/11/17 02/11/18  [provider]  aspirin EC 81 MG EC tablet Take 1 tablet (81 mg total) by mouth daily. 02/27/15   Gladstone Lighter, MD   atorvastatin (LIPITOR) 80 MG tablet Take 1 tablet (80 mg total) by mouth daily. 06/22/17 09/20/17  Rise Mu, PA-C  atorvastatin (LIPITOR) 80 MG tablet Take 80 mg by mouth daily.    [provider]  Blood Glucose Monitoring Suppl W/DEVICE KIT Test 1-2 times daily; E11.65 diagnosis 03/02/15   Rubbie Battiest, RN  cephALEXin (KEFLEX) 500 MG capsule Take 1 capsule (500 mg total) by mouth 3 (three) times daily. Patient not taking: Reported on 02/06/2018 01/10/18   Carrie Mew, MD  clopidogrel (PLAVIX) 75 MG tablet Take 75 mg by mouth once daily with breakfast 09/07/17   Minna Merritts, MD  cyclobenzaprine (FLEXERIL) 10 MG tablet Take 10 mg by mouth 3 (three) times daily as needed for muscle spasms.  07/17/16   [provider]  furosemide (LASIX) 20 MG tablet TAKE 1 TABLET BY MOUTH EVERY DAY 12/08/17   Minna Merritts, MD  furosemide (LASIX) 40 MG tablet  Take 40 mg by mouth daily. 12/20/17   [provider]  gabapentin (NEURONTIN) 600 MG tablet Take 900 mg by mouth 4 (four) times daily.  02/27/17   [provider]  Insulin Glargine (BASAGLAR KWIKPEN) 100 UNIT/ML SOPN Inject 20 Units into the skin daily.  03/04/17   [provider]  Insulin Glargine (BASAGLAR KWIKPEN) 100 UNIT/ML SOPN Inject 50 Units into the skin at bedtime.    [provider]  isosorbide mononitrate (IMDUR) 30 MG 24 hr tablet Take 1 tablet (30 mg total) by mouth daily. 05/28/17   Minna Merritts, MD  liraglutide (VICTOZA) 18 MG/3ML SOPN Inject 1.8 mg into the skin daily.  02/20/17   [provider]  lisinopril (PRINIVIL,ZESTRIL) 40 MG tablet Take 1 tablet (40 mg total) by mouth daily. 08/17/17   Minna Merritts, MD  loperamide (IMODIUM A-D) 2 MG tablet Take 2 tablets (4 mg total) by mouth 4 (four) times daily as needed for diarrhea or loose stools. 01/10/18   Carrie Mew, MD  metoprolol (TOPROL-XL) 200 MG 24 hr tablet Take 200 mg by mouth once daily 08/17/17   Minna Merritts, MD  nitroGLYCERIN (NITROSTAT) 0.4 MG SL tablet Place 1 tablet (0.4 mg total) under the tongue every 5 (five) minutes as needed for chest pain (Do not give more than 3 SL tablets in 15 minutes.). 08/05/16   Fritzi Mandes, MD  pantoprazole (PROTONIX) 40 MG tablet TAKE 1 TABLET(40 MG) BY MOUTH DAILY Patient taking differently: Take 40 mg by mouth daily.  12/03/17   Dunn, Areta Haber, PA-C  polyethylene glycol (MIRALAX / Floria Raveling) packet Take 17 g by mouth daily as needed for mild constipation.    [provider]  RANEXA 1000 MG SR tablet TAKE 1 TABLET BY MOUTH TWICE DAILY( EVERY TWELVE HOURS) 01/11/18   Minna Merritts, MD  ranolazine (RANEXA) 500 MG 12 hr tablet Take 500 mg by mouth 2 (two) times daily. 01/08/18   [provider]    Allergies Carbamazepine; Lithium; Codeine; Levofloxacin in d5w; Methocarbamol; Tape; Lodine [etodolac]; and Relafen [nabumetone]  Family History  Problem Relation Age of Onset  . Lung cancer Mother   . Cirrhosis Father     Social History Social History   Tobacco Use  . Smoking status: Current Every Day Smoker    Packs/day: 0.25    Years: 35.00    Pack years: 8.75    Types: Cigarettes  . Smokeless tobacco: Never Used  . Tobacco comment: Patches  Substance Use Topics  . Alcohol use: Yes    Alcohol/week: 0.0 standard drinks    Comment: occ  . Drug use: Yes    Types: Marijuana     Review of Systems  Constitutional: Patient has fever and chills.  Eyes: No visual changes. No discharge ENT: No upper respiratory complaints. Cardiovascular: no chest pain. Respiratory: no cough. No SOB. Gastrointestinal: No abdominal pain.  No nausea, no vomiting.  No diarrhea.  No constipation. Genitourinary: Negative for dysuria. No hematuria Musculoskeletal: Negative for musculoskeletal pain. Skin: Patient has left inner thigh cellulitis.  Neurological: Negative for headaches, focal weakness or  numbness.   ____________________________________________   PHYSICAL EXAM:  VITAL SIGNS: ED Triage Vitals  Enc Vitals Group     BP 02/06/18 1937 129/78     Pulse Rate 02/06/18 1937 (!) 102     Resp 02/06/18 1937 18     Temp 02/06/18 1937 99.7 F (37.6 C)     Temp Source 02/06/18  1937 Oral     SpO2 02/06/18 1937 97 %     Weight 02/06/18 1938 212 lb (96.2 kg)     Height 02/06/18 1938 '5\' 2"'$  (1.575 m)     Head Circumference --      Peak Flow --      Pain Score 02/06/18 1938 10     Pain Loc --      Pain Edu? --      Excl. in [place]? --      Constitutional: Alert and oriented. Well appearing and in no acute distress. Eyes: Conjunctivae are normal. PERRL. EOMI. Head: Atraumatic. Cardiovascular: Normal rate, regular rhythm. Normal S1 and S2.  Good peripheral circulation. Respiratory: Normal respiratory effort without tachypnea or retractions. Lungs CTAB. Good air entry to the bases with no decreased or absent breath sounds. Gastrointestinal: Bowel sounds 4 quadrants. Soft and nontender to palpation. No guarding or rigidity. No palpable masses. No distention. No CVA tenderness. Musculoskeletal: Full range of motion to all extremities. No gross deformities appreciated. Neurologic:  Normal speech and language. No gross focal neurologic deficits are appreciated.  Skin: Patient has left inner thigh cellulitis, 6 cm in width by 10 cm in length with proximal induration and no fluctuance. Psychiatric: Mood and affect are normal. Speech and behavior are normal. Patient exhibits appropriate insight and judgement.   ____________________________________________   LABS (all labs ordered are listed, but only abnormal results are displayed)  Labs Reviewed  CBC WITH DIFFERENTIAL/PLATELET - Abnormal; Notable for the following components:      Result Value   WBC 21.6 (*)    Neutro Abs 17.7 (*)    Abs Immature Granulocytes 0.15 (*)    All other components within normal limits  COMPREHENSIVE  METABOLIC PANEL - Abnormal; Notable for the following components:   Glucose, Bld 228 (*)    BUN 22 (*)    AST 14 (*)    All other components within normal limits  URINALYSIS, COMPLETE (UACMP) WITH MICROSCOPIC - Abnormal; Notable for the following components:   Color, Urine YELLOW (*)    APPearance CLEAR (*)    Bacteria, UA RARE (*)    All other components within normal limits  CULTURE, BLOOD (ROUTINE X 2)  CULTURE, BLOOD (ROUTINE X 2)  LACTIC ACID, PLASMA  LACTIC ACID, PLASMA   ____________________________________________  EKG   ____________________________________________  RADIOLOGY  No results found.  ____________________________________________    PROCEDURES  Procedure(s) performed:    Procedures    Medications  clindamycin (CLEOCIN) IVPB 600 mg (600 mg Intravenous New Bag/Given 02/06/18 2254)  HYDROcodone-acetaminophen (NORCO/VICODIN) 5-325 MG per tablet 1 tablet (1 tablet Oral Given 02/06/18 2251)  ondansetron (ZOFRAN-ODT) disintegrating tablet 4 mg (4 mg Oral Given 02/06/18 2251)     ____________________________________________   INITIAL IMPRESSION / ASSESSMENT AND PLAN / ED COURSE  Pertinent labs & imaging results that were available during my care of the patient were reviewed by me and considered in my medical decision making (see chart for details).  Review of the [place] CSRS was performed in accordance of the [place] prior to dispensing any controlled drugs.      Assessment and plan Cellulitis Patient presents to the emergency department with left inner thigh cellulitis for the past 3 days.  Patient had significant leukocytosis on CBC.  Given fever and tachycardia noted at triage with leukocytosis and history of diabetes, patient would benefit from admission for IV antibiotics and observation. Attending Dr. Corky Downs agrees with plan of care. Dr. Jannifer Franklin was  consulted and accepted patient for admission. All patient questions were  answered.    ____________________________________________  FINAL CLINICAL IMPRESSION(S) / ED DIAGNOSES  Final diagnoses:  Cellulitis of left lower extremity      NEW MEDICATIONS STARTED DURING THIS VISIT:  ED Discharge Orders    None          This chart was dictated using voice recognition software/Dragon. Despite best efforts to proofread, errors can occur which can change the meaning. Any change was purely unintentional.    Lannie Fields, PA-C 02/06/18 2339    Earleen Newport, MD 02/07/18 1324

## 2018-02-06 NOTE — ED Triage Notes (Signed)
Patient states that she thinks that she was bit by a spider two days ago. Patient states that she has three areas to her right inner thigh that is becoming worse. Patient states that she felt like she had a fever today but did not have a thermometer.

## 2018-02-06 NOTE — ED Notes (Signed)
Pt has  Pain  Swelling and redness l upper thigh  Slight drainage  After she squeezed it

## 2018-02-07 ENCOUNTER — Other Ambulatory Visit: Payer: Self-pay

## 2018-02-07 LAB — CBC
HCT: 37.1 % (ref 36.0–46.0)
HEMOGLOBIN: 11.9 g/dL — AB (ref 12.0–15.0)
MCH: 31.3 pg (ref 26.0–34.0)
MCHC: 32.1 g/dL (ref 30.0–36.0)
MCV: 97.6 fL (ref 80.0–100.0)
NRBC: 0 % (ref 0.0–0.2)
Platelets: 272 10*3/uL (ref 150–400)
RBC: 3.8 MIL/uL — AB (ref 3.87–5.11)
RDW: 15.3 % (ref 11.5–15.5)
WBC: 19 10*3/uL — ABNORMAL HIGH (ref 4.0–10.5)

## 2018-02-07 LAB — BASIC METABOLIC PANEL
Anion gap: 8 (ref 5–15)
BUN: 24 mg/dL — ABNORMAL HIGH (ref 6–20)
CALCIUM: 8.8 mg/dL — AB (ref 8.9–10.3)
CO2: 27 mmol/L (ref 22–32)
CREATININE: 1.15 mg/dL — AB (ref 0.44–1.00)
Chloride: 105 mmol/L (ref 98–111)
GFR calc Af Amer: >60 mL/min (ref >60)
GFR, EST NON AFRICAN AMERICAN: 53 mL/min — AB (ref >60)
GLUCOSE: 166 mg/dL — AB (ref 70–99)
POTASSIUM: 3.7 mmol/L (ref 3.5–5.1)
Sodium: 140 mmol/L (ref 135–145)

## 2018-02-07 LAB — GLUCOSE, CAPILLARY
GLUCOSE-CAPILLARY: 168 mg/dL — AB (ref 70–99)
Glucose-Capillary: 245 mg/dL — ABNORMAL HIGH (ref 70–99)
Glucose-Capillary: 182 mg/dL — ABNORMAL HIGH (ref 70–99)
Glucose-Capillary: 139 mg/dL — ABNORMAL HIGH (ref 70–99)
Glucose-Capillary: 206 mg/dL — ABNORMAL HIGH (ref 70–99)

## 2018-02-07 LAB — LACTIC ACID, PLASMA
Lactic Acid, Venous: 0.9 mmol/L (ref 0.5–1.9)
Lactic Acid, Venous: 1 mmol/L (ref 0.5–1.9)

## 2018-02-07 MED ORDER — ALBUTEROL SULFATE (2.5 MG/3ML) 0.083% IN NEBU
3.0000 mL | INHALATION_SOLUTION | RESPIRATORY_TRACT | Status: DC | PRN
  Administered 2018-02-07: 3 mL via RESPIRATORY_TRACT
  Filled 2018-02-07: qty 3

## 2018-02-07 MED ORDER — INSULIN GLARGINE 100 UNIT/ML ~~LOC~~ SOLN
10.0000 [IU] | Freq: Every day | SUBCUTANEOUS | Status: DC
  Administered 2018-02-07: 10 [IU] via SUBCUTANEOUS
  Filled 2018-02-07 (×2): qty 0.1

## 2018-02-07 MED ORDER — ACETAMINOPHEN 325 MG PO TABS: 650.0000 mg | ORAL_TABLET | Freq: Four times a day (QID) | ORAL | Status: DC | PRN

## 2018-02-07 MED ORDER — LISINOPRIL 20 MG PO TABS
40.0000 mg | ORAL_TABLET | Freq: Every day | ORAL | Status: DC
  Administered 2018-02-07: 40 mg via ORAL
  Filled 2018-02-07: qty 2

## 2018-02-07 MED ORDER — OXYCODONE HCL 5 MG PO TABS
5.0000 mg | ORAL_TABLET | ORAL | Status: DC | PRN
  Administered 2018-02-07 – 2018-02-10 (×13): 5 mg via ORAL
  Filled 2018-02-07 (×13): qty 1

## 2018-02-07 MED ORDER — FUROSEMIDE 40 MG PO TABS
40.0000 mg | ORAL_TABLET | Freq: Every day | ORAL | Status: DC
  Administered 2018-02-07: 40 mg via ORAL
  Filled 2018-02-07: qty 1

## 2018-02-07 MED ORDER — PNEUMOCOCCAL VAC POLYVALENT 25 MCG/0.5ML IJ INJ: 0.5000 mL | INJECTION | INTRAMUSCULAR | Status: DC

## 2018-02-07 MED ORDER — MORPHINE SULFATE (PF) 2 MG/ML IV SOLN
2.0000 mg | INTRAVENOUS | Status: DC | PRN
  Administered 2018-02-09 – 2018-02-10 (×4): 2 mg via INTRAVENOUS
  Filled 2018-02-07 (×4): qty 1

## 2018-02-07 MED ORDER — METOPROLOL SUCCINATE ER 50 MG PO TB24
200.0000 mg | ORAL_TABLET | Freq: Every day | ORAL | Status: DC
  Administered 2018-02-07: 200 mg via ORAL
  Filled 2018-02-07: qty 4

## 2018-02-07 MED ORDER — GABAPENTIN 300 MG PO CAPS
900.0000 mg | ORAL_CAPSULE | Freq: Four times a day (QID) | ORAL | Status: DC
  Administered 2018-02-07 – 2018-02-10 (×13): 900 mg via ORAL
  Filled 2018-02-07 (×13): qty 3

## 2018-02-07 MED ORDER — CLINDAMYCIN PHOSPHATE 600 MG/50ML IV SOLN
600.0000 mg | Freq: Once | INTRAVENOUS | Status: AC
  Administered 2018-02-07: 600 mg via INTRAVENOUS
  Filled 2018-02-07 (×2): qty 50

## 2018-02-07 MED ORDER — METOPROLOL SUCCINATE ER 50 MG PO TB24
50.0000 mg | ORAL_TABLET | Freq: Every day | ORAL | Status: DC
  Administered 2018-02-08 – 2018-02-10 (×3): 50 mg via ORAL
  Filled 2018-02-07 (×3): qty 1

## 2018-02-07 MED ORDER — ENOXAPARIN SODIUM 40 MG/0.4ML ~~LOC~~ SOLN
40.0000 mg | SUBCUTANEOUS | Status: DC
  Administered 2018-02-07 – 2018-02-09 (×3): 40 mg via SUBCUTANEOUS
  Filled 2018-02-07 (×4): qty 0.4

## 2018-02-07 MED ORDER — ONDANSETRON HCL 4 MG/2ML IJ SOLN
4.0000 mg | Freq: Four times a day (QID) | INTRAMUSCULAR | Status: DC | PRN
  Administered 2018-02-09 – 2018-02-10 (×3): 4 mg via INTRAVENOUS
  Filled 2018-02-07 (×3): qty 2

## 2018-02-07 MED ORDER — INSULIN ASPART 100 UNIT/ML ~~LOC~~ SOLN
0.0000 [IU] | Freq: Three times a day (TID) | SUBCUTANEOUS | Status: DC
  Administered 2018-02-07: 1 [IU] via SUBCUTANEOUS
  Administered 2018-02-07: 3 [IU] via SUBCUTANEOUS
  Administered 2018-02-07 (×2): 2 [IU] via SUBCUTANEOUS
  Administered 2018-02-08: 3 [IU] via SUBCUTANEOUS
  Administered 2018-02-08 (×3): 2 [IU] via SUBCUTANEOUS
  Administered 2018-02-09: 3 [IU] via SUBCUTANEOUS
  Administered 2018-02-09: 2 [IU] via SUBCUTANEOUS
  Administered 2018-02-10 (×2): 5 [IU] via SUBCUTANEOUS
  Administered 2018-02-10: 1 [IU] via SUBCUTANEOUS
  Filled 2018-02-07 (×12): qty 1

## 2018-02-07 MED ORDER — SODIUM CHLORIDE 0.9 % IV BOLUS
1000.0000 mL | Freq: Once | INTRAVENOUS | Status: AC
  Administered 2018-02-07: 1000 mL via INTRAVENOUS

## 2018-02-07 MED ORDER — VANCOMYCIN HCL IN DEXTROSE 750-5 MG/150ML-% IV SOLN
750.0000 mg | Freq: Two times a day (BID) | INTRAVENOUS | Status: DC
  Administered 2018-02-07 – 2018-02-08 (×3): 750 mg via INTRAVENOUS
  Filled 2018-02-07 (×5): qty 150

## 2018-02-07 MED ORDER — NICOTINE 14 MG/24HR TD PT24
14.0000 mg | MEDICATED_PATCH | Freq: Every day | TRANSDERMAL | Status: DC
  Administered 2018-02-07 – 2018-02-08 (×2): 14 mg via TRANSDERMAL
  Filled 2018-02-07 (×4): qty 1

## 2018-02-07 MED ORDER — ACETAMINOPHEN 650 MG RE SUPP: 650.0000 mg | Freq: Four times a day (QID) | RECTAL | Status: DC | PRN

## 2018-02-07 MED ORDER — CLOPIDOGREL BISULFATE 75 MG PO TABS
75.0000 mg | ORAL_TABLET | Freq: Every day | ORAL | Status: DC
  Administered 2018-02-07 – 2018-02-10 (×4): 75 mg via ORAL
  Filled 2018-02-07 (×4): qty 1

## 2018-02-07 MED ORDER — PANTOPRAZOLE SODIUM 40 MG PO TBEC
40.0000 mg | DELAYED_RELEASE_TABLET | Freq: Every day | ORAL | Status: DC
  Administered 2018-02-07 – 2018-02-10 (×4): 40 mg via ORAL
  Filled 2018-02-07 (×4): qty 1

## 2018-02-07 MED ORDER — ONDANSETRON HCL 4 MG PO TABS
4.0000 mg | ORAL_TABLET | Freq: Four times a day (QID) | ORAL | Status: DC | PRN
  Administered 2018-02-07: 4 mg via ORAL
  Filled 2018-02-07: qty 1

## 2018-02-07 MED ORDER — IBUPROFEN 400 MG PO TABS
600.0000 mg | ORAL_TABLET | Freq: Four times a day (QID) | ORAL | Status: DC | PRN
  Administered 2018-02-07 – 2018-02-08 (×4): 600 mg via ORAL
  Filled 2018-02-07 (×4): qty 2

## 2018-02-07 MED ORDER — SODIUM CHLORIDE 0.9 % IV SOLN
3.0000 g | Freq: Four times a day (QID) | INTRAVENOUS | Status: DC
  Administered 2018-02-07 – 2018-02-08 (×6): 3 g via INTRAVENOUS
  Administered 2018-02-09: 3000 mg via INTRAVENOUS
  Administered 2018-02-09 – 2018-02-10 (×6): 3 g via INTRAVENOUS
  Filled 2018-02-07 (×17): qty 3

## 2018-02-07 MED ORDER — VANCOMYCIN HCL IN DEXTROSE 1-5 GM/200ML-% IV SOLN
1000.0000 mg | Freq: Once | INTRAVENOUS | Status: AC
  Administered 2018-02-07: 1000 mg via INTRAVENOUS
  Filled 2018-02-07: qty 200

## 2018-02-07 MED ORDER — RANOLAZINE ER 500 MG PO TB12
1000.0000 mg | ORAL_TABLET | Freq: Two times a day (BID) | ORAL | Status: DC
  Administered 2018-02-07 – 2018-02-10 (×8): 1000 mg via ORAL
  Filled 2018-02-07 (×9): qty 2

## 2018-02-07 MED ORDER — CYCLOBENZAPRINE HCL 10 MG PO TABS
10.0000 mg | ORAL_TABLET | Freq: Three times a day (TID) | ORAL | Status: DC | PRN
  Administered 2018-02-07 – 2018-02-10 (×8): 10 mg via ORAL
  Filled 2018-02-07 (×8): qty 1

## 2018-02-07 MED ORDER — ATORVASTATIN CALCIUM 20 MG PO TABS
80.0000 mg | ORAL_TABLET | Freq: Every day | ORAL | Status: DC
  Administered 2018-02-07 – 2018-02-09 (×4): 80 mg via ORAL
  Filled 2018-02-07 (×4): qty 4

## 2018-02-07 MED ORDER — ASPIRIN EC 81 MG PO TBEC
81.0000 mg | DELAYED_RELEASE_TABLET | Freq: Every day | ORAL | Status: DC
  Administered 2018-02-07 – 2018-02-10 (×4): 81 mg via ORAL
  Filled 2018-02-07 (×4): qty 1

## 2018-02-07 NOTE — Progress Notes (Signed)
Pharmacy Antibiotic Note  Jennifer [name] is a 57 y.o. female admitted on 02/06/2018. Patient with cellulitis of left inner thigh and concern for sepsis. Pharmacy has been consulted for Unasyn and vancomycin dosing.  Plan: Unasyn 3 g IV q6h  Vancomycin 1 g IV given. Will order 750 mg IV q12h to start tonight at 2000. Goal trough (sepsis) 15-20 mcg/mL, however will aim for lower end of trough as likely source is cellulitis. Trough ordered for 10/22 at 0730.  Height: 5\' 2"  (157.5 cm) Weight: 212 lb (96.2 kg) IBW/kg (Calculated) : 50.1  Temp (24hrs), Avg:98.3 F (36.8 C), Min:97.5 F (36.4 C), Max:99.7 F (37.6 C)  Recent Labs  Lab 02/06/18 2237 02/07/18 0023 02/07/18 0220  WBC 21.6*  --  19.0*  CREATININE 0.98  --  1.15*  LATICACIDVEN  --  0.9 1.0    Estimated Creatinine Clearance: 60.5 mL/min (A) (by C-G formula based on SCr of 1.15 mg/dL (H)).    Allergies  Allergen Reactions  . Carbamazepine Anaphylaxis and Other (See Comments)    Blood dyscrasia when on with Lithium  . Lithium Other (See Comments)    Blood dyscrasia  . Lodine [Etodolac] Shortness Of Breath, Diarrhea, Nausea And Vomiting and Rash  . Codeine Other (See Comments)    Upset stomach  . Levofloxacin In D5w Swelling and Other (See Comments)    "Salty" sensation in mouth. "Hard time to explain it"  . Methocarbamol Other (See Comments)    Unknown  . Tape Other (See Comments)    [place], blister on skin  . Relafen [Nabumetone] Diarrhea, Nausea And Vomiting and Rash    Antimicrobials this admission: Unasyn 10/20 >> Vancomycin 10/20 >>  Dose adjustments this admission: N/A  Microbiology results: 10/20 BCx: NG 12 hours   Thank you for allowing pharmacy to be a part of this patient's care.  Pricilla Riffle, PharmD Pharmacy Resident  02/07/2018 2:14 PM

## 2018-02-07 NOTE — Progress Notes (Signed)
Report given to [facility] RN she will resume care until 7am

## 2018-02-07 NOTE — Plan of Care (Signed)

## 2018-02-07 NOTE — Consult Note (Signed)
SURGICAL CONSULTATION NOTE   HISTORY OF PRESENT ILLNESS (HPI):  57 y.o. female presented to [facility] ED for evaluation of left leg infection. Patient reports has left leg pain since couple of days ago. At the beginning she thought it was a bug bite but in couple of days it started to get more red, tender and warm. She report unquantified fever. Pain aggravated with palpation and pressure. No alleviator factor identified. Denies chills.  Surgery is consulted by Dr. Dede Query in this context for evaluation and management of left leg cellulitis.  PAST MEDICAL HISTORY (PMH):  Past Medical History:  Diagnosis Date  . Anxiety   . Arthritis    "qwhere" (06/14/2014)  . Bipolar disorder ([place])   . CAD (coronary artery disease)    a. 2008 PCI/DES to RCA; b. 2012 PCI: 95% mRCA s/p PCI/DES; b. 2/16 PCI DES-> 90% dRCA; c. 11/16 PCI: dRCA ISR s/p PCI/DES;  d. 4/18 PCI: p-mRCA 95% s/p PCI/DES; e. 02/2017 Cath: LML nl, LAD nl, D1 40, LCX min irregs, OM1 60, RCA 50-60p/m ISR, 63md, patent dRCA stent, RPDA 90ost (fills via L->R collats), EF 55-65%.  . Chronic bronchitis ([place]    "get it q yr"  . Daily headache    "when my blood pressure is up" (06/14/2014)  . Depression   . Diastolic dysfunction    a. 02/2017 Echo: EF 55-60%, no rwma, Gr1 DD, nl RV fxn.  .Marland KitchenGERD (gastroesophageal reflux disease)   . Heart murmur   . High cholesterol   . History of stomach ulcers   . Hypertension   . Insomnia   . Kidney stones   . Migraine    "haven't had them in a good while; did get them 1-2 times/yr" (06/14/2014)  . Myocardial infarction ([place] 2008; ~ 2012  . Pneumonia    "get it 1-2 times/year" (06/14/2014)  . RLS (restless legs syndrome)   . Seizures ([place]   . Sleep apnea    "they want me to do the lab but I haven't" (06/14/2014)  . Stroke ([facility]    "they say I've had several" (06/14/2014)  . Tobacco abuse   . Type II diabetes mellitus ([place]   . Urinary, incontinence, stress female      PAST SURGICAL HISTORY  ([place]:  Past Surgical History:  Procedure Laterality Date  . ABDOMINAL HYSTERECTOMY  1984   "I've got 1 ovary left"  . CARDIAC CATHETERIZATION N/A 02/26/2015   Procedure: Left Heart Cath and Coronary Angiography;  Surgeon: MWellington Hampshire MD;  [place];  Service: Cardiovascular;  Laterality: N/A;  . CARDIAC CATHETERIZATION N/A 02/26/2015   Procedure: Coronary Stent Intervention;  Surgeon: MWellington Hampshire MD;  [place];  Service: Cardiovascular;  Laterality: N/A;  . CESAREAN SECTION  1984  . CORONARY ANGIOPLASTY WITH STENT PLACEMENT  2008; ~ 2012   "1; 1"  . CORONARY BALLOON ANGIOPLASTY N/A 06/09/2017   Procedure: CORONARY BALLOON ANGIOPLASTY;  Surgeon: ENelva Bush MD;  [place];  Service: Cardiovascular;  Laterality: N/A;  . CORONARY STENT INTERVENTION N/A 08/04/2016   Procedure: Coronary Stent Intervention;  Surgeon: MWellington Hampshire MD;  [place] [facility];  Service: Cardiovascular;  Laterality: N/A;  . DILATION AND CURETTAGE OF UTERUS    . EXTRACORPOREAL SHOCK WAVE LITHOTRIPSY  X 2  . KNEE ARTHROSCOPY Left   . LEFT HEART CATH Bilateral 08/04/2016   Procedure: Left Heart Cath poss PCI;  Surgeon: MWellington Hampshire  MD;  Location: [place] [facility] LAB;  Service: Cardiovascular;  Laterality: Bilateral;  . LEFT HEART CATH AND CORONARY ANGIOGRAPHY Left 02/19/2017   Procedure: LEFT HEART CATH AND CORONARY ANGIOGRAPHY;  Surgeon: Minna Merritts, MD;  Location: [place] [facility] LAB;  Service: Cardiovascular;  Laterality: Left;  . LEFT HEART CATH AND CORONARY ANGIOGRAPHY Left 06/09/2017   Procedure: LEFT HEART CATH AND CORONARY ANGIOGRAPHY;  Surgeon: Nelva Bush, MD;  Location: [place] [facility] LAB;  Service: Cardiovascular;  Laterality: Left;  . LEFT HEART CATHETERIZATION WITH CORONARY ANGIOGRAM N/A 06/15/2014   Procedure: LEFT HEART CATHETERIZATION WITH CORONARY ANGIOGRAM;  Surgeon: Jettie Booze, MD;   Location: [facility] CATH LAB;  Service: Cardiovascular;  Laterality: N/A;  . PERCUTANEOUS CORONARY STENT INTERVENTION (PCI-S)  06/15/2014   Procedure: PERCUTANEOUS CORONARY STENT INTERVENTION (PCI-S);  Surgeon: Jettie Booze, MD;  Location: [facility] CATH LAB;  Service: Cardiovascular;;  . TONSILLECTOMY    . TUBAL LIGATION       MEDICATIONS:  Prior to Admission medications   Medication Sig Start Date End Date Taking? Authorizing Provider  albuterol (PROVENTIL HFA;VENTOLIN HFA) 108 (90 Base) MCG/ACT inhaler Inhale 1-2 puffs into the lungs every 4 (four) hours as needed for wheezing or shortness of breath.  02/11/17 02/11/18 Yes [provider]  aspirin EC 81 MG EC tablet Take 1 tablet (81 mg total) by mouth daily. 02/27/15  Yes Gladstone Lighter, MD  atorvastatin (LIPITOR) 80 MG tablet Take 80 mg by mouth at bedtime.    Yes [provider]  clopidogrel (PLAVIX) 75 MG tablet Take 75 mg by mouth once daily with breakfast 09/07/17  Yes Gollan, Kathlene November, MD  cyclobenzaprine (FLEXERIL) 10 MG tablet Take 10 mg by mouth 3 (three) times daily as needed for muscle spasms.  07/17/16  Yes [provider]  furosemide (LASIX) 40 MG tablet Take 40 mg by mouth daily. 12/20/17  Yes [provider]  gabapentin (NEURONTIN) 600 MG tablet Take 900 mg by mouth 4 (four) times daily.  02/27/17  Yes [provider]  liraglutide (VICTOZA) 18 MG/3ML SOPN Inject 1.8 mg into the skin daily.  02/20/17  Yes [provider]  lisinopril (PRINIVIL,ZESTRIL) 40 MG tablet Take 1 tablet (40 mg total) by mouth daily. 08/17/17  Yes Minna Merritts, MD  metoprolol (TOPROL-XL) 200 MG 24 hr tablet Take 200 mg by mouth once daily 08/17/17  Yes Gollan, Kathlene November, MD  pantoprazole (PROTONIX) 40 MG tablet TAKE 1 TABLET(40 MG) BY MOUTH DAILY Patient taking differently: Take 40 mg by mouth daily.  12/03/17  Yes Dunn, Areta Haber, PA-C  polyethylene glycol (MIRALAX / GLYCOLAX) packet Take 17 g by mouth daily  as needed for mild constipation.   Yes [provider]  RANEXA 1000 MG SR tablet TAKE 1 TABLET BY MOUTH TWICE DAILY( EVERY TWELVE HOURS) 01/11/18  Yes Gollan, Kathlene November, MD  TRESIBA FLEXTOUCH 200 UNIT/ML SOPN Inject 66 Units into the skin daily. 02/01/18  Yes [provider]  atorvastatin (LIPITOR) 80 MG tablet Take 1 tablet (80 mg total) by mouth daily. 06/22/17 09/20/17  Rise Mu, PA-C  Blood Glucose Monitoring Suppl W/DEVICE KIT Test 1-2 times daily; E11.65 diagnosis 03/02/15   Rubbie Battiest, RN     ALLERGIES:  Allergies  Allergen Reactions  . Carbamazepine Anaphylaxis and Other (See Comments)    Blood dyscrasia when on with Lithium  . Lithium Other (See Comments)    Blood dyscrasia  . Lodine [Etodolac] Shortness Of Breath,  Diarrhea, Nausea And Vomiting and Rash  . Codeine Other (See Comments)    Upset stomach  . Levofloxacin In D5w Swelling and Other (See Comments)    "Salty" sensation in mouth. "Hard time to explain it"  . Methocarbamol Other (See Comments)    Unknown  . Tape Other (See Comments)    Welps, blister on skin  . Relafen [Nabumetone] Diarrhea, Nausea And Vomiting and Rash     SOCIAL HISTORY:  Social History   Socioeconomic History  . Marital status: Legally Separated    Spouse name: Not on file  . Number of children: Not on file  . Years of education: Not on file  . Highest education level: Not on file  Occupational History  . Not on file  Social Needs  . Financial resource strain: Not on file  . Food insecurity:    Worry: Not on file    Inability: Not on file  . Transportation needs:    Medical: Not on file    Non-medical: Not on file  Tobacco Use  . Smoking status: Current Every Day Smoker    Packs/day: 0.25    Years: 35.00    Pack years: 8.75    Types: Cigarettes  . Smokeless tobacco: Never Used  . Tobacco comment: Patches  Substance and Sexual Activity  . Alcohol use: Yes    Alcohol/week: 0.0 standard drinks    Comment:  occ  . Drug use: Yes    Types: Marijuana  . Sexual activity: Not on file  Lifestyle  . Physical activity:    Days per week: Not on file    Minutes per session: Not on file  . Stress: Not on file  Relationships  . Social connections:    Talks on phone: Not on file    Gets together: Not on file    Attends religious service: Not on file    Active member of club or organization: Not on file    Attends meetings of clubs or organizations: Not on file    Relationship status: Not on file  . Intimate partner violence:    Fear of current or ex partner: Not on file    Emotionally abused: Not on file    Physically abused: Not on file    Forced sexual activity: Not on file  Other Topics Concern  . Not on file  Social History Narrative  . Not on file    The patient currently resides (home / rehab facility / nursing home): Home The patient normally is (ambulatory / bedbound): Ambulatory   FAMILY HISTORY:  Family History  Problem Relation Age of Onset  . Lung cancer Mother   . Cirrhosis Father      REVIEW OF SYSTEMS:  Constitutional: denies weight loss, chills, or sweats. Positive for fever Eyes: denies any other vision changes, history of eye injury  ENT: denies sore throat, hearing problems  Respiratory: denies shortness of breath, wheezing  Cardiovascular: denies chest pain, palpitations  Gastrointestinal: denies abdominal pain, N/V, or diarrhea Genitourinary: denies burning with urination or urinary frequency Musculoskeletal: denies any other joint pains or cramps  Skin: positive for skin redness and pain Neurological: denies any other headache, dizziness, weakness  Psychiatric: denies any other depression, anxiety   All other review of systems were negative   VITAL SIGNS:  Temp:  [97.5 F (36.4 C)-99.7 F (37.6 C)] 98 F (36.7 C) (10/20 1520) Pulse Rate:  [74-102] 74 (10/20 1520) Resp:  [14-18] 18 (10/20 1520) BP: (93-129)/(60-78)  93/68 (10/20 1520) SpO2:  [95 %-98 %]  98 % (10/20 1520) Weight:  [96.2 kg] 96.2 kg (10/19 1938)     Height: _0  (157.5 cm) Weight: 96.2 kg BMI (Calculated): 38.77   INTAKE/OUTPUT:  This shift: Total I/O In: 240 [P.O.:240] Out: -   Last 2 shifts: _1 @   PHYSICAL EXAM:  Constitutional:  -- Normal body habitus  -- Awake, alert, and oriented x3  Eyes:  -- Pupils equally round and reactive to light  -- No scleral icterus  Ear, nose, and throat:  -- No jugular venous distension  Pulmonary:  -- No crackles  -- Equal breath sounds bilaterally -- Breathing non-labored at rest Cardiovascular:  -- S1, S2 present  -- No pericardial rubs Gastrointestinal:  -- Abdomen soft, nontender, non-distended, no guarding or rebound tenderness -- No abdominal masses appreciated, pulsatile or otherwise  Musculoskeletal and Integumentary:  -- Wounds or skin discoloration: left leg erythema, warm, tender, minimal drainage Neurologic:  -- Motor function: intact and symmetric -- Sensation: intact and symmetric   Labs:  CBC Latest Ref Rng & Units 02/07/2018 02/06/2018 01/10/2018  WBC 4.0 - 10.5 K/uL 19.0(H) 21.6(H) 13.0(H)  Hemoglobin 12.0 - 15.0 g/dL 11.9(L) 12.7 13.7  Hematocrit 36.0 - 46.0 % 37.1 39.2 40.9  Platelets 150 - 400 K/uL 272 295 311   CMP Latest Ref Rng & Units 02/07/2018 02/06/2018 01/10/2018  Glucose 70 - 99 mg/dL 166(H) 228(H) 159(H)  BUN 6 - 20 mg/dL 24(H) 22(H) 32(H)  Creatinine 0.44 - 1.00 mg/dL 1.15(H) 0.98 1.67(H)  Sodium 135 - 145 mmol/L 140 139 143  Potassium 3.5 - 5.1 mmol/L 3.7 3.9 3.5  Chloride 98 - 111 mmol/L 105 105 111  CO2 22 - 32 mmol/L _2 Calcium 8.9 - 10.3 mg/dL 8.8(L) 9.0 9.4  Total Protein 6.5 - 8.1 g/dL - 7.2 7.6  Total Bilirubin 0.3 - 1.2 mg/dL - 0.6 0.3  Alkaline Phos 38 - 126 U/L - 69 82  AST 15 - 41 U/L - 14(L) 12(L)  ALT 0 - 44 U/L - 15 15    Imaging studies:  No imaging studies this admission  Assessment/Plan:  57 y.o. female with left leg cellulitis, complicated  by pertinent comorbidities including coronary artery disease status post multiple stent (on Plavix and aspirin), COPD, morbid obesity, smoker, DM, history of stroke.  Patient with left thigh cellulitis. At this moment there is no fluctuant area to drain. I recommend to continue antibiotic therapy. It may happen one of two things: that the antibiotic control and heal the infection or that the antibiotic control the infection and forms an abscess amenable for drainage. Will follow patient clinically and if abscess is form will drain. Agree with current antibiotic therapy. Patient high risk for procedure due to Coronary artery disease with recent need of angioplasty, increased risk of bleeding due to plavix and aspirin, and also other medical condition that predispose her to poor healing (DM, smoking, obesity). Will follow and perform drainage if needed.   All of the above findings and recommendations were discussed with the patient, and all of patient's questions were answered to her expressed satisfaction.  Arnold Long, MD

## 2018-02-07 NOTE — Progress Notes (Signed)
Pt stating that she takes an albuterol inhaler and would like to have it ordered. Spoke with Dr. Amado Coe may order albuterol.

## 2018-02-07 NOTE — Care Management Obs Status (Signed)
MEDICARE OBSERVATION STATUS NOTIFICATION   Patient Details  Name: Jennifer [name] MRN: [id number] Date of Birth: [birth year]   Medicare Observation Status Notification Given:  Yes    Engelbert Sevin A Kingdom Vanzanten, RN 02/07/2018, 8:51 AM

## 2018-02-07 NOTE — Progress Notes (Addendum)
[facility] [place] [facility]   PATIENT NAME: Jennifer [name]    MR#:  [id number]  DATE OF BIRTH:  12-Sep-1960  SUBJECTIVE:  CHIEF COMPLAINT:   Chief Complaint  Patient presents with  . Wound Check   -Significant swelling, tenderness and induration of left inner thigh.  Admitted for cellulitis and IV antibiotics.  No improvement noted.  REVIEW OF SYSTEMS:  Review of Systems  Constitutional: Negative for chills, fever and malaise/fatigue.  HENT: Negative for congestion, ear discharge, hearing loss and nosebleeds.   Eyes: Negative for blurred vision and double vision.  Respiratory: Negative for cough, shortness of breath and wheezing.   Cardiovascular: Negative for chest pain and palpitations.  Gastrointestinal: Negative for abdominal pain, constipation, diarrhea, nausea and vomiting.  Genitourinary: Negative for dysuria and urgency.  Musculoskeletal: Positive for myalgias.  Neurological: Negative for dizziness, focal weakness, seizures, weakness and headaches.  Psychiatric/Behavioral: Negative for depression.    DRUG ALLERGIES:   Allergies  Allergen Reactions  . Carbamazepine Anaphylaxis and Other (See Comments)    Blood dyscrasia when on with Lithium  . Lithium Other (See Comments)    Blood dyscrasia  . Lodine [Etodolac] Shortness Of Breath, Diarrhea, Nausea And Vomiting and Rash  . Codeine Other (See Comments)    Upset stomach  . Levofloxacin In D5w Swelling and Other (See Comments)    "Salty" sensation in mouth. "Hard time to explain it"  . Methocarbamol Other (See Comments)    Unknown  . Tape Other (See Comments)    Welps, blister on skin  . Relafen [Nabumetone] Diarrhea, Nausea And Vomiting and Rash    VITALS:  Blood pressure 114/61, pulse 79, temperature (!) 97.5 F (36.4 C), temperature source Oral, resp. rate 18, height 5\' 2"  (1.575 m), weight 96.2 kg, SpO2 98 %.  PHYSICAL EXAMINATION:  Physical Exam  GENERAL:  57 y.o.-year-old  obese patient lying in the bed with no acute distress.  EYES: Pupils equal, round, reactive to light and accommodation. No scleral icterus. Extraocular muscles intact.  HEENT: Head atraumatic, normocephalic. Oropharynx and nasopharynx clear.  NECK:  Supple, no jugular venous distention. No thyroid enlargement, no tenderness.  LUNGS: Normal breath sounds bilaterally, no wheezing, rales,rhonchi or crepitation. No use of accessory muscles of respiration.  Decreased bibasilar breath sounds. CARDIOVASCULAR: S1, S2 normal. No murmurs, rubs, or gallops.  ABDOMEN: Soft, nontender, nondistended. Bowel sounds present. No organomegaly or mass.  EXTREMITIES: left inner thigh with erythema and tenderness.  There is an indurated area more proximally.  No pedal edema, cyanosis, or clubbing.  NEUROLOGIC: Cranial nerves II through XII are intact. Muscle strength 5/5 in all extremities. Sensation intact. Gait not checked.  PSYCHIATRIC: The patient is alert and oriented x 3.  SKIN: No obvious rash, lesion, or ulcer.    LABORATORY PANEL:   CBC Recent Labs  Lab 02/07/18 0220  WBC 19.0*  HGB 11.9*  HCT 37.1  PLT 272   ------------------------------------------------------------------------------------------------------------------  Chemistries  Recent Labs  Lab 02/06/18 2237 02/07/18 0220  NA 139 140  K 3.9 3.7  CL 105 105  CO2 25 27  GLUCOSE 228* 166*  BUN 22* 24*  CREATININE 0.98 1.15*  CALCIUM 9.0 8.8*  AST 14*  --   ALT 15  --   ALKPHOS 69  --   BILITOT 0.6  --    ------------------------------------------------------------------------------------------------------------------  Cardiac Enzymes No results for input(s): TROPONINI in the last 168 hours. ------------------------------------------------------------------------------------------------------------------  RADIOLOGY:  No results found.  EKG:  Orders placed or performed in visit on 06/17/17  . EKG 12-Lead     ASSESSMENT AND PLAN:   57 year old female with obesity, diastolic CHF, COPD with ongoing smoking, CAD, bipolar disorder, diabetes and hypertension presents to hospital secondary to worsening left inner thigh erythema warmth and tenderness.  1.  Left inner thigh cellulitis-started after a bug bite, has a significant area of induration and tenderness. -WBC elevated on presentation with tachycardia -Continue IV antibiotics-vancomycin and Unasyn -Surgery consult requested to see if patient will need an I&D  2. Sepsis- secondary to above, improving- IV ABX started  3.  Hypertension-his blood pressure is low normal, will discontinue lisinopril and decrease dose of metoprolol for now.  4.  Diabetes mellitus-we will start Lantus as noticed above available.  Victoza is on hold while inpatient. -Continue sliding scale insulin  5.  History of CAD-continue aspirin, Plavix and statin.  Will check with surgery if Plavix needs to be held for procedure.  6.  DVT prophylaxis-on Lovenox   All the records are reviewed and case discussed with Care Management/Social Workerr. Management plans discussed with the patient, family and they are in agreement.  CODE STATUS: Full code  TOTAL TIME TAKING CARE OF THIS PATIENT: 37 minutes.   POSSIBLE D/C IN 1-2 DAYS, DEPENDING ON CLINICAL CONDITION.   Farha Dano M.D on 02/07/2018 at 11:42 AM  Between 7am to 6pm - Pager - [phone]  After 6pm go to [email or web address] - Social research officer, government  [facility]  Office  [phone]  CC: Primary care physician; Charleen Kirks, MD

## 2018-02-07 NOTE — Progress Notes (Signed)
BP low, MD paged 

## 2018-02-07 NOTE — Progress Notes (Signed)
Assumed pt care, pt resting quietly. Call bell at reach.

## 2018-02-07 NOTE — Progress Notes (Signed)
Spoke with Dr. Anne Hahn pt with a blood pressure of 93/60 and still having a lot of pain. Pt does have oxycodone ordered but blood pressure on the low side. Per MD may give the oxycodone and 1L bolus.

## 2018-02-07 NOTE — Progress Notes (Signed)
MD called back regarding BP, no new orders.

## 2018-02-07 NOTE — H&P (Signed)
Jennifer [name] at [facility] NAME: Jennifer [name]    MR#:  [id number]  DATE OF BIRTH:  Aug 25, 1960  DATE OF ADMISSION:  02/06/2018  PRIMARY CARE PHYSICIAN: Jeronimo Greaves, MD   REQUESTING/REFERRING PHYSICIAN: Corky Downs, MD  CHIEF COMPLAINT:   Chief Complaint  Patient presents with  . Wound Check    HISTORY OF PRESENT ILLNESS:  Jennifer [name]  is a 57 y.o. female who presents with chief complaint as above.  She presents with 2 days of worsening left inner thigh erythema, warmth, tenderness, and induration.  She states that this started initially with a central area that she thought was a bug bite.  She tried to "squeeze the pus out of it", but did not get much.  Since then the erythema has spread in the tenderness and induration have gotten worse.  Here in the ED tonight she was found to have an elevated white blood cell count, she was tachycardic on presentation.  Hospitalist were called for admission  PAST MEDICAL HISTORY:   Past Medical History:  Diagnosis Date  . Anxiety   . Arthritis    "qwhere" (06/14/2014)  . Bipolar disorder ([place])   . CAD (coronary artery disease)    a. 2008 PCI/DES to RCA; b. 2012 PCI: 95% mRCA s/p PCI/DES; b. 2/16 PCI DES-> 90% dRCA; c. 11/16 PCI: dRCA ISR s/p PCI/DES;  d. 4/18 PCI: p-mRCA 95% s/p PCI/DES; e. 02/2017 Cath: LML nl, LAD nl, D1 40, LCX min irregs, OM1 60, RCA 50-60p/m ISR, 59md, patent dRCA stent, RPDA 90ost (fills via L->R collats), EF 55-65%.  . Chronic bronchitis ([place]    "get it q yr"  . Daily headache    "when my blood pressure is up" (06/14/2014)  . Depression   . Diastolic dysfunction    a. 02/2017 Echo: EF 55-60%, no rwma, Gr1 DD, nl RV fxn.  .Marland KitchenGERD (gastroesophageal reflux disease)   . Heart murmur   . High cholesterol   . History of stomach ulcers   . Hypertension   . Insomnia   . Kidney stones   . Migraine    "haven't had them in a good while; did get them 1-2 times/yr" (06/14/2014)  .  [place]ocardial infarction ([place] 2008; ~ 2012  . Pneumonia    "get it 1-2 times/year" (06/14/2014)  . RLS (restless legs syndrome)   . Seizures ([place]   . Sleep apnea    "they want me to do the lab but I haven't" (06/14/2014)  . Stroke ([facility]    "they say I've had several" (06/14/2014)  . Tobacco abuse   . Type II diabetes mellitus ([place]   . Urinary, incontinence, stress female      PAST SURGICAL HISTORY:   Past Surgical History:  Procedure Laterality Date  . ABDOMINAL HYSTERECTOMY  1984   "I've got 1 ovary left"  . CARDIAC CATHETERIZATION N/A 02/26/2015   Procedure: Left Heart Cath and Coronary Angiography;  Surgeon: MWellington Hampshire MD;  [facility];  Service: Cardiovascular;  Laterality: N/A;  . CARDIAC CATHETERIZATION N/A 02/26/2015   Procedure: Coronary Stent Intervention;  Surgeon: MWellington Hampshire MD;  [facility];  Service: Cardiovascular;  Laterality: N/A;  . CESAREAN SECTION  1984  . CORONARY ANGIOPLASTY WITH STENT PLACEMENT  2008; ~ 2012   "1; 1"  . CORONARY BALLOON ANGIOPLASTY N/A 06/09/2017   Procedure: CORONARY BALLOON ANGIOPLASTY;  Surgeon: ENelva Bush MD;  [facility];  Service: Cardiovascular;  Laterality: N/A;  . CORONARY STENT INTERVENTION N/A 08/04/2016   Procedure: Coronary Stent Intervention;  Surgeon: Wellington Hampshire, MD;  Location: [place] CV LAB;  Service: Cardiovascular;  Laterality: N/A;  . DILATION AND CURETTAGE OF UTERUS    . EXTRACORPOREAL SHOCK WAVE LITHOTRIPSY  X 2  . KNEE ARTHROSCOPY Left   . LEFT HEART CATH Bilateral 08/04/2016   Procedure: Left Heart Cath poss PCI;  Surgeon: Wellington Hampshire, MD;  Location: [place] CV LAB;  Service: Cardiovascular;  Laterality: Bilateral;  . LEFT HEART CATH AND CORONARY ANGIOGRAPHY Left 02/19/2017   Procedure: LEFT HEART CATH AND CORONARY ANGIOGRAPHY;  Surgeon: Minna Merritts, MD;  Location: [place] CV LAB;  Service: Cardiovascular;   Laterality: Left;  . LEFT HEART CATH AND CORONARY ANGIOGRAPHY Left 06/09/2017   Procedure: LEFT HEART CATH AND CORONARY ANGIOGRAPHY;  Surgeon: Nelva Bush, MD;  Location: [facility] CV LAB;  Service: Cardiovascular;  Laterality: Left;  . LEFT HEART CATHETERIZATION WITH CORONARY ANGIOGRAM N/A 06/15/2014   Procedure: LEFT HEART CATHETERIZATION WITH CORONARY ANGIOGRAM;  Surgeon: Jettie Booze, MD;  Location[facility] CATH LAB;  Service: Cardiovascular;  Laterality: N/A;  . PERCUTANEOUS CORONARY STENT INTERVENTION (PCI-S)  06/15/2014   Procedure: PERCUTANEOUS CORONARY STENT INTERVENTION (PCI-S);  Surgeon: Jettie Booze, MD;  Location: [facility] CATH LAB;  Service: Cardiovascular;;  . TONSILLECTOMY    . TUBAL LIGATION       SOCIAL HISTORY:   Social History   Tobacco Use  . Smoking status: Current Every Day Smoker    Packs/day: 0.25    Years: 35.00    Pack years: 8.75    Types: Cigarettes  . Smokeless tobacco: Never Used  . Tobacco comment: Patches  Substance Use Topics  . Alcohol use: Yes    Alcohol/week: 0.0 standard drinks    Comment: occ     FAMILY HISTORY:   Family History  Problem Relation Age of Onset  . Lung cancer Mother   . Cirrhosis Father      DRUG ALLERGIES:   Allergies  Allergen Reactions  . Carbamazepine Anaphylaxis and Other (See Comments)    Blood dyscrasia when on with Lithium  . Lithium Other (See Comments)    Blood dyscrasia  . Lodine [Etodolac] Shortness Of Breath, Diarrhea, Nausea And Vomiting and Rash  . Codeine Other (See Comments)    Upset stomach  . Levofloxacin In D5w Swelling and Other (See Comments)    "Salty" sensation in mouth. "Hard time to explain it"  . Methocarbamol Other (See Comments)    Unknown  . Tape Other (See Comments)    Welps, blister on skin  . Relafen [Nabumetone] Diarrhea, Nausea And Vomiting and Rash    MEDICATIONS AT HOME:   Prior to Admission medications   Medication Sig Start Date End Date Taking?  Authorizing Provider  albuterol (PROVENTIL HFA;VENTOLIN HFA) 108 (90 Base) MCG/ACT inhaler Inhale 1-2 puffs into the lungs every 4 (four) hours as needed for wheezing or shortness of breath.  02/11/17 02/11/18 Yes [provider]  aspirin EC 81 MG EC tablet Take 1 tablet (81 mg total) by mouth daily. 02/27/15  Yes Gladstone Lighter, MD  atorvastatin (LIPITOR) 80 MG tablet Take 80 mg by mouth at bedtime.    Yes [provider]  clopidogrel (PLAVIX) 75 MG tablet Take 75 mg by mouth once daily with breakfast 09/07/17  Yes Gollan, Kathlene November, MD  cyclobenzaprine (FLEXERIL) 10 MG tablet Take  10 mg by mouth 3 (three) times daily as needed for muscle spasms.  07/17/16  Yes [provider]  furosemide (LASIX) 40 MG tablet Take 40 mg by mouth daily. 12/20/17  Yes [provider]  gabapentin (NEURONTIN) 600 MG tablet Take 900 mg by mouth 4 (four) times daily.  02/27/17  Yes [provider]  liraglutide (VICTOZA) 18 MG/3ML SOPN Inject 1.8 mg into the skin daily.  02/20/17  Yes [provider]  lisinopril (PRINIVIL,ZESTRIL) 40 MG tablet Take 1 tablet (40 mg total) by mouth daily. 08/17/17  Yes Minna Merritts, MD  metoprolol (TOPROL-XL) 200 MG 24 hr tablet Take 200 mg by mouth once daily 08/17/17  Yes Gollan, Kathlene November, MD  pantoprazole (PROTONIX) 40 MG tablet TAKE 1 TABLET(40 MG) BY MOUTH DAILY Patient taking differently: Take 40 mg by mouth daily.  12/03/17  Yes Dunn, Areta Haber, PA-C  polyethylene glycol (MIRALAX / GLYCOLAX) packet Take 17 g by mouth daily as needed for mild constipation.   Yes [provider]  RANEXA 1000 MG SR tablet TAKE 1 TABLET BY MOUTH TWICE DAILY( EVERY TWELVE HOURS) 01/11/18  Yes Gollan, Kathlene November, MD  TRESIBA FLEXTOUCH 200 UNIT/ML SOPN Inject 66 Units into the skin daily. 02/01/18  Yes [provider]  atorvastatin (LIPITOR) 80 MG tablet Take 1 tablet (80 mg total) by mouth daily. 06/22/17 09/20/17  Rise Mu, PA-C  Blood  Glucose Monitoring Suppl W/DEVICE KIT Test 1-2 times daily; E11.65 diagnosis 03/02/15   Rubbie Battiest, RN    REVIEW OF SYSTEMS:  Review of Systems  Constitutional: Negative for chills, fever, malaise/fatigue and weight loss.  HENT: Negative for ear pain, hearing loss and tinnitus.   Eyes: Negative for blurred vision, double vision, pain and redness.  Respiratory: Negative for cough, hemoptysis and shortness of breath.   Cardiovascular: Negative for chest pain, palpitations, orthopnea and leg swelling.  Gastrointestinal: Negative for abdominal pain, constipation, diarrhea, nausea and vomiting.  Genitourinary: Negative for dysuria, frequency and hematuria.  Musculoskeletal: Negative for back pain, joint pain and neck pain.  Skin:       Left inner thigh erythema, tenderness, warmth, induration  Neurological: Negative for dizziness, tremors, focal weakness and weakness.  Endo/Heme/Allergies: Negative for polydipsia. Does not bruise/bleed easily.  Psychiatric/Behavioral: Negative for depression. The patient is not nervous/anxious and does not have insomnia.      VITAL SIGNS:   Vitals:   02/06/18 1937 02/06/18 1938  BP: 129/78   Pulse: (!) 102   Resp: 18   Temp: 99.7 F (37.6 C)   TempSrc: Oral   SpO2: 97%   Weight:  96.2 kg  Height:  '5\' 2"'$  (1.575 m)   Wt Readings from Last 3 Encounters:  02/06/18 96.2 kg  01/10/18 90.7 kg  06/17/17 94.6 kg    PHYSICAL EXAMINATION:  Physical Exam  Vitals reviewed. Constitutional: She is oriented to person, place, and time. She appears well-developed and well-nourished. No distress.  HENT:  Head: Normocephalic and atraumatic.  Mouth/Throat: Oropharynx is clear and moist.  Eyes: Pupils are equal, round, and reactive to light. Conjunctivae and EOM are normal. No scleral icterus.  Neck: Normal range of motion. Neck supple. No JVD present. No thyromegaly present.  Cardiovascular: Regular rhythm and intact distal pulses. Exam reveals no gallop  and no friction rub.  No murmur heard. Tachycardic  Respiratory: Effort normal and breath sounds normal. No respiratory distress. She has no wheezes. She has no rales.  GI: Soft. Bowel  sounds are normal. She exhibits no distension. There is no tenderness.  Musculoskeletal: Normal range of motion. She exhibits no edema.  No arthritis, no gout  Lymphadenopathy:    She has no cervical adenopathy.  Neurological: She is alert and oriented to person, place, and time. No cranial nerve deficit.  No dysarthria, no aphasia  Skin: Skin is warm and dry. No rash noted. There is erythema (With tenderness, warmth, induration without fluctuance of her left inner thigh).  Psychiatric: She has a normal mood and affect. Her behavior is normal. Judgment and thought content normal.    LABORATORY PANEL:   CBC Recent Labs  Lab 02/06/18 2237  WBC 21.6*  HGB 12.7  HCT 39.2  PLT 295   ------------------------------------------------------------------------------------------------------------------  Chemistries  Recent Labs  Lab 02/06/18 2237  NA 139  K 3.9  CL 105  CO2 25  GLUCOSE 228*  BUN 22*  CREATININE 0.98  CALCIUM 9.0  AST 14*  ALT 15  ALKPHOS 69  BILITOT 0.6   ------------------------------------------------------------------------------------------------------------------  Cardiac Enzymes No results for input(s): TROPONINI in the last 168 hours. ------------------------------------------------------------------------------------------------------------------  RADIOLOGY:  No results found.  EKG:   Orders placed or performed in visit on 06/17/17  . EKG 12-Lead    IMPRESSION AND PLAN:  Principal Problem:   Sepsis ([place]) -due to cellulitis, IV antibiotics initiated, lactic acid within normal limits, blood pressure stable, cultures sent Active Problems:   Cellulitis -IV antibiotics, supportive treatment PRN   Essential hypertension -stable, continue home meds   Poorly  controlled type 2 diabetes mellitus with complication (HCC) -sliding scale insulin with corresponding glucose checks   CAD (coronary artery disease) -continue home medications   GERD (gastroesophageal reflux disease) -Home dose PPI   Hyperlipidemia -Home dose antilipid  Chart review performed and case discussed with ED provider. Labs, imaging and/or ECG reviewed by provider and discussed with patient/family. Management plans discussed with the patient and/or family.  DVT PROPHYLAXIS: SubQ lovenox   GI PROPHYLAXIS:  PPI   ADMISSION STATUS: Observation  CODE STATUS: Full Code Status History    Date Active Date Inactive Code Status Order ID Comments User Context   06/09/2017 1529 06/10/2017 1608 Full Code [id number]  Nelva Bush, MD Inpatient   02/19/2017 1204 02/19/2017 1651 Full Code [id number]  Minna Merritts, MD Inpatient   08/02/2016 0753 08/05/2016 1628 Full Code [id number]  Saundra Shelling, MD Inpatient   02/26/2015 1350 02/27/2015 1651 Full Code [id number]  Wellington Hampshire, MD Inpatient   02/23/2015 0819 02/26/2015 1350 Full Code [id number]  Harrie Foreman, MD Inpatient   06/15/2014 1229 06/16/2014 1754 Full Code [id number]  Jettie Booze, MD Inpatient   06/14/2014 0847 06/15/2014 1229 Full Code [id number]  Karen Kitchens Inpatient   06/14/2014 0725 06/14/2014 0847 Full Code [id number]  Louellen Molder, MD Inpatient      TOTAL TIME TAKING CARE OF THIS PATIENT: 40 minutes.   Jannifer Franklin, Mellina Benison [place] 02/07/2018, 12:22 AM  [facility]  Office  [phone]  CC: Primary care physician; Jeronimo Greaves, MD  Note:  This document was prepared using Dragon voice recognition software and may include unintentional dictation errors.

## 2018-02-08 DIAGNOSIS — L539 Erythematous condition, unspecified: Secondary | ICD-10-CM | POA: Diagnosis present

## 2018-02-08 DIAGNOSIS — E7800 Pure hypercholesterolemia, unspecified: Secondary | ICD-10-CM | POA: Diagnosis present

## 2018-02-08 DIAGNOSIS — Z90710 Acquired absence of both cervix and uterus: Secondary | ICD-10-CM | POA: Diagnosis not present

## 2018-02-08 DIAGNOSIS — L03116 Cellulitis of left lower limb: Secondary | ICD-10-CM | POA: Diagnosis present

## 2018-02-08 DIAGNOSIS — Z955 Presence of coronary angioplasty implant and graft: Secondary | ICD-10-CM | POA: Diagnosis not present

## 2018-02-08 DIAGNOSIS — J449 Chronic obstructive pulmonary disease, unspecified: Secondary | ICD-10-CM | POA: Diagnosis present

## 2018-02-08 DIAGNOSIS — G2581 Restless legs syndrome: Secondary | ICD-10-CM | POA: Diagnosis present

## 2018-02-08 DIAGNOSIS — I110 Hypertensive heart disease with heart failure: Secondary | ICD-10-CM | POA: Diagnosis present

## 2018-02-08 DIAGNOSIS — T63301A Toxic effect of unspecified spider venom, accidental (unintentional), initial encounter: Secondary | ICD-10-CM | POA: Diagnosis present

## 2018-02-08 DIAGNOSIS — I252 Old myocardial infarction: Secondary | ICD-10-CM | POA: Diagnosis not present

## 2018-02-08 DIAGNOSIS — I2510 Atherosclerotic heart disease of native coronary artery without angina pectoris: Secondary | ICD-10-CM | POA: Diagnosis present

## 2018-02-08 DIAGNOSIS — K219 Gastro-esophageal reflux disease without esophagitis: Secondary | ICD-10-CM | POA: Diagnosis present

## 2018-02-08 DIAGNOSIS — M1990 Unspecified osteoarthritis, unspecified site: Secondary | ICD-10-CM | POA: Diagnosis present

## 2018-02-08 DIAGNOSIS — I5032 Chronic diastolic (congestive) heart failure: Secondary | ICD-10-CM | POA: Diagnosis present

## 2018-02-08 DIAGNOSIS — E785 Hyperlipidemia, unspecified: Secondary | ICD-10-CM | POA: Diagnosis present

## 2018-02-08 DIAGNOSIS — Z87442 Personal history of urinary calculi: Secondary | ICD-10-CM | POA: Diagnosis not present

## 2018-02-08 DIAGNOSIS — A419 Sepsis, unspecified organism: Secondary | ICD-10-CM | POA: Diagnosis present

## 2018-02-08 DIAGNOSIS — Z8711 Personal history of peptic ulcer disease: Secondary | ICD-10-CM | POA: Diagnosis not present

## 2018-02-08 DIAGNOSIS — N393 Stress incontinence (female) (male): Secondary | ICD-10-CM | POA: Diagnosis present

## 2018-02-08 DIAGNOSIS — G4730 Sleep apnea, unspecified: Secondary | ICD-10-CM | POA: Diagnosis present

## 2018-02-08 DIAGNOSIS — Z6838 Body mass index (BMI) 38.0-38.9, adult: Secondary | ICD-10-CM | POA: Diagnosis not present

## 2018-02-08 DIAGNOSIS — F17210 Nicotine dependence, cigarettes, uncomplicated: Secondary | ICD-10-CM | POA: Diagnosis present

## 2018-02-08 DIAGNOSIS — E1165 Type 2 diabetes mellitus with hyperglycemia: Secondary | ICD-10-CM | POA: Diagnosis present

## 2018-02-08 DIAGNOSIS — L02416 Cutaneous abscess of left lower limb: Secondary | ICD-10-CM | POA: Diagnosis present

## 2018-02-08 LAB — BASIC METABOLIC PANEL
Anion gap: 8 (ref 5–15)
BUN: 28 mg/dL — ABNORMAL HIGH (ref 6–20)
CALCIUM: 8.6 mg/dL — AB (ref 8.9–10.3)
CO2: 25 mmol/L (ref 22–32)
CREATININE: 1.15 mg/dL — AB (ref 0.44–1.00)
Chloride: 101 mmol/L (ref 98–111)
GFR, EST NON AFRICAN AMERICAN: 53 mL/min — AB (ref >60)
Glucose, Bld: 190 mg/dL — ABNORMAL HIGH (ref 70–99)
Potassium: 4.8 mmol/L (ref 3.5–5.1)
Sodium: 134 mmol/L — ABNORMAL LOW (ref 135–145)

## 2018-02-08 LAB — GLUCOSE, CAPILLARY
GLUCOSE-CAPILLARY: 151 mg/dL — AB (ref 70–99)
GLUCOSE-CAPILLARY: 218 mg/dL — AB (ref 70–99)
Glucose-Capillary: 155 mg/dL — ABNORMAL HIGH (ref 70–99)
Glucose-Capillary: 186 mg/dL — ABNORMAL HIGH (ref 70–99)

## 2018-02-08 LAB — CBC
HEMATOCRIT: 35.2 % — AB (ref 36.0–46.0)
Hemoglobin: 11.2 g/dL — ABNORMAL LOW (ref 12.0–15.0)
MCH: 31.6 pg (ref 26.0–34.0)
MCHC: 31.8 g/dL (ref 30.0–36.0)
MCV: 99.4 fL (ref 80.0–100.0)
NRBC: 0 % (ref 0.0–0.2)
PLATELETS: 260 10*3/uL (ref 150–400)
RBC: 3.54 MIL/uL — ABNORMAL LOW (ref 3.87–5.11)
RDW: 15 % (ref 11.5–15.5)
WBC: 14.5 10*3/uL — AB (ref 4.0–10.5)

## 2018-02-08 MED ORDER — IBUPROFEN 400 MG PO TABS
600.0000 mg | ORAL_TABLET | Freq: Four times a day (QID) | ORAL | Status: DC | PRN
  Administered 2018-02-08 – 2018-02-10 (×7): 600 mg via ORAL
  Filled 2018-02-08 (×7): qty 2

## 2018-02-08 MED ORDER — INSULIN GLARGINE 100 UNIT/ML ~~LOC~~ SOLN
14.0000 [IU] | Freq: Every day | SUBCUTANEOUS | Status: DC
  Administered 2018-02-08: 14 [IU] via SUBCUTANEOUS
  Filled 2018-02-08 (×2): qty 0.14

## 2018-02-08 MED ORDER — FUROSEMIDE 40 MG PO TABS
40.0000 mg | ORAL_TABLET | Freq: Every day | ORAL | Status: DC
  Administered 2018-02-09 – 2018-02-10 (×2): 40 mg via ORAL
  Filled 2018-02-08 (×2): qty 1

## 2018-02-08 MED ORDER — SODIUM CHLORIDE 0.9 % IV SOLN
INTRAVENOUS | Status: AC
  Administered 2018-02-08 – 2018-02-09 (×2): via INTRAVENOUS

## 2018-02-08 NOTE — Progress Notes (Signed)
Patient c/o swelling in her lower extremeties, and expressed concern because she has not had her lasix since she was admitted. Dr. Anne Hahn notified and gave verbal order to start her lasix back tomorrow and decrease her IVF rate to 50 mL/hr. Orders placed. Trudee Kuster

## 2018-02-08 NOTE — Progress Notes (Signed)
SURGICAL PROGRESS NOTE   Hospital Day(s): 0.   Post op day(s):  [name]   Interval History: Patient seen and examined, no acute events or new complaints overnight. Patient reports feeling frustrated because most of his home medications were changed. Refers there is no improvement on left thigh area with pain.   Vital signs in last 24 hours: [min-max] current  Temp:  [97.7 F (36.5 C)-100.1 F (37.8 C)] 100.1 F (37.8 C) (10/21 1712) Pulse Rate:  [71-83] 77 (10/21 1712) Resp:  [16-18] 18 (10/21 1712) BP: (95-122)/(66-79) 122/79 (10/21 1712) SpO2:  [95 %-97 %] 97 % (10/21 1712)     Height: 5\' 2"  (157.5 cm) Weight: 96.2 kg BMI (Calculated): 38.77   Physical Exam:  Constitutional: alert, cooperative and no distress  Respiratory: breathing non-labored at rest  Cardiovascular: regular rate and sinus rhythm  Gastrointestinal: soft, non-tender, and non-distended.  Labs:  CBC Latest Ref Rng & Units 02/08/2018 02/07/2018 02/06/2018  WBC 4.0 - 10.5 K/uL 14.5(H) 19.0(H) 21.6(H)  Hemoglobin 12.0 - 15.0 g/dL 11.2(L) 11.9(L) 12.7  Hematocrit 36.0 - 46.0 % 35.2(L) 37.1 39.2  Platelets 150 - 400 K/uL 260 272 295   CMP Latest Ref Rng & Units 02/08/2018 02/07/2018 02/06/2018  Glucose 70 - 99 mg/dL 811(B) 147(W) 295(A)  BUN 6 - 20 mg/dL 21(H) 08(M) 57(Q)  Creatinine 0.44 - 1.00 mg/dL 4.69(G) 2.95(M) 8.41  Sodium 135 - 145 mmol/L 134(L) 140 139  Potassium 3.5 - 5.1 mmol/L 4.8 3.7 3.9  Chloride 98 - 111 mmol/L 101 105 105  CO2 22 - 32 mmol/L 25 27 25   Calcium 8.9 - 10.3 mg/dL 3.2(G) 4.0(N) 9.0  Total Protein 6.5 - 8.1 g/dL - - 7.2  Total Bilirubin 0.3 - 1.2 mg/dL - - 0.6  Alkaline Phos 38 - 126 U/L - - 69  AST 15 - 41 U/L - - 14(L)  ALT 0 - 44 U/L - - 15   Imaging studies: No new pertinent imaging studies  Assessment/Plan:  57 y.o. female with left leg cellulitis, complicated by pertinent comorbidities including coronary artery disease status post multiple stent (on Plavix and aspirin),  COPD, morbid obesity, smoker, DM, history of stroke. Patient today with improved leukocytosis but no change clinically on left thigh cellulitis. Continue with minimal drain. Patient oriented to give her 24 more hours if no improvement will take to OR or incision and drainage of the area of fluctuance.   Gae Gallop, MD

## 2018-02-08 NOTE — Progress Notes (Signed)
[facility] -  at [place]   PATIENT NAME: Jennifer [name]    MR#:  [id number]  DATE OF BIRTH:  15-Dec-1960  SUBJECTIVE:  CHIEF COMPLAINT:   Chief Complaint  Patient presents with  . Wound Check   -improving swelling, tenderness and induration of left inner thigh.  Admitted for cellulitis and on IV antibiotics.  REVIEW OF SYSTEMS:  Review of Systems  Constitutional: Negative for chills, fever and malaise/fatigue.  HENT: Negative for congestion, ear discharge, hearing loss and nosebleeds.   Eyes: Negative for blurred vision and double vision.  Respiratory: Negative for cough, shortness of breath and wheezing.   Cardiovascular: Negative for chest pain and palpitations.  Gastrointestinal: Negative for abdominal pain, constipation, diarrhea, nausea and vomiting.  Genitourinary: Negative for dysuria and urgency.  Musculoskeletal: Positive for myalgias.  Neurological: Negative for dizziness, focal weakness, seizures, weakness and headaches.  Psychiatric/Behavioral: Negative for depression.    DRUG ALLERGIES:   Allergies  Allergen Reactions  . Carbamazepine Anaphylaxis and Other (See Comments)    Blood dyscrasia when on with Lithium  . Lithium Other (See Comments)    Blood dyscrasia  . Lodine [Etodolac] Shortness Of Breath, Diarrhea, Nausea And Vomiting and Rash  . Codeine Other (See Comments)    Upset stomach  . Levofloxacin In D5w Swelling and Other (See Comments)    "Salty" sensation in mouth. "Hard time to explain it"  . Methocarbamol Other (See Comments)    Unknown  . Tape Other (See Comments)    Welps, blister on skin  . Relafen [Nabumetone] Diarrhea, Nausea And Vomiting and Rash    VITALS:  Blood pressure 116/78, pulse 83, temperature 97.7 F (36.5 C), temperature source Oral, resp. rate 18, height 5\' 2"  (1.575 m), weight 96.2 kg, SpO2 97 %.  PHYSICAL EXAMINATION:  Physical Exam  GENERAL:  57 y.o.-year-old obese patient lying in the  bed with no acute distress.  EYES: Pupils equal, round, reactive to light and accommodation. No scleral icterus. Extraocular muscles intact.  HEENT: Head atraumatic, normocephalic. Oropharynx and nasopharynx clear.  NECK:  Supple, no jugular venous distention. No thyroid enlargement, no tenderness.  LUNGS: Normal breath sounds bilaterally, no wheezing, rales,rhonchi or crepitation. No use of accessory muscles of respiration.  Decreased bibasilar breath sounds. CARDIOVASCULAR: S1, S2 normal. No murmurs, rubs, or gallops.  ABDOMEN: Soft, nontender, nondistended. Bowel sounds present. No organomegaly or mass.  EXTREMITIES: left inner thigh with erythema and tenderness.  There is an indurated area more proximally.  No pedal edema, cyanosis, or clubbing.  NEUROLOGIC: Cranial nerves II through XII are intact. Muscle strength 5/5 in all extremities. Sensation intact. Gait not checked.  PSYCHIATRIC: The patient is alert and oriented x 3.  SKIN: No obvious rash, lesion, or ulcer.    LABORATORY PANEL:   CBC Recent Labs  Lab 02/08/18 0638  WBC 14.5*  HGB 11.2*  HCT 35.2*  PLT 260   ------------------------------------------------------------------------------------------------------------------  Chemistries  Recent Labs  Lab 02/06/18 2237  02/08/18 0638  NA 139   < > 134*  K 3.9   < > 4.8  CL 105   < > 101  CO2 25   < > 25  GLUCOSE 228*   < > 190*  BUN 22*   < > 28*  CREATININE 0.98   < > 1.15*  CALCIUM 9.0   < > 8.6*  AST 14*  --   --   ALT 15  --   --   ALKPHOS 69  --   --  BILITOT 0.6  --   --    < > = values in this interval not displayed.   ------------------------------------------------------------------------------------------------------------------  Cardiac Enzymes No results for input(s): TROPONINI in the last 168 hours. ------------------------------------------------------------------------------------------------------------------  RADIOLOGY:  No results  found.  EKG:   Orders placed or performed in visit on 06/17/17  . EKG 12-Lead    ASSESSMENT AND PLAN:   57 year old female with obesity, diastolic CHF, COPD with ongoing smoking, CAD, bipolar disorder, diabetes and hypertension presents to hospital secondary to worsening left inner thigh erythema warmth and tenderness.  1.  Left inner thigh cellulitis-started after a bug bite, has a significant area of induration and tenderness. -WBC elevated on presentation with tachycardia- now improving -Continue IV antibiotics-vancomycin and Unasyn -Surgery consult requested - no plans for I&D as no abscess noted- continue ABX  2. Sepsis- secondary to above, improving Wound cultures if any drainage noted. Blood cultures are negative  3.  Hypertension-  blood pressure is low normal, will discontinue lisinopril and decrease dose of metoprolol for now.  4.  Diabetes mellitus- Lantus - adjust home dose.  Victoza is on hold while inpatient. -Continue sliding scale insulin  5.  History of CAD-continue aspirin, Plavix and statin.  PCI in feb 2019  6.  DVT prophylaxis-on Lovenox   All the records are reviewed and case discussed with Care Management/Social Workerr. Management plans discussed with the patient, family and they are in agreement.   CODE STATUS: Full code  TOTAL TIME TAKING CARE OF THIS PATIENT: 37 minutes.   POSSIBLE D/C IN 1-2 DAYS, DEPENDING ON CLINICAL CONDITION.   Enid Baas M.D on 02/08/2018 at 1:37 PM  Between 7am to 6pm - Pager - [phone]  After 6pm go to [email or web address] - Social research officer, government  [facility]  Office  [phone]  CC: Primary care physician; Charleen Kirks, MD

## 2018-02-09 ENCOUNTER — Inpatient Hospital Stay: Payer: Medicare Other | Admitting: Anesthesiology

## 2018-02-09 ENCOUNTER — Encounter: Payer: Self-pay | Admitting: *Deleted

## 2018-02-09 ENCOUNTER — Encounter
Admission: EM | Disposition: A | Discharge status: Home / Self Care | Payer: Self-pay | Source: Home / Self Care | Attending: Internal Medicine

## 2018-02-09 HISTORY — PX: I & D EXTREMITY: SHX5045

## 2018-02-09 LAB — URINE DRUG SCREEN, QUALITATIVE (ARMC ONLY)
Amphetamines, Ur Screen: NOT DETECTED
Barbiturates, Ur Screen: NOT DETECTED
Benzodiazepine, Ur Scrn: NOT DETECTED
CANNABINOID 50 NG, UR ~~LOC~~: POSITIVE — AB
Cocaine Metabolite,Ur ~~LOC~~: NOT DETECTED
MDMA (ECSTASY) UR SCREEN: NOT DETECTED
METHADONE SCREEN, URINE: NOT DETECTED
Opiate, Ur Screen: POSITIVE — AB
Phencyclidine (PCP) Ur S: NOT DETECTED
TRICYCLIC, UR SCREEN: POSITIVE — AB

## 2018-02-09 LAB — CBC
HCT: 33.8 % — ABNORMAL LOW (ref 36.0–46.0)
Hemoglobin: 10.9 g/dL — ABNORMAL LOW (ref 12.0–15.0)
MCH: 31.8 pg (ref 26.0–34.0)
MCHC: 32.2 g/dL (ref 30.0–36.0)
MCV: 98.5 fL (ref 80.0–100.0)
PLATELETS: 258 10*3/uL (ref 150–400)
RBC: 3.43 MIL/uL — AB (ref 3.87–5.11)
RDW: 14.7 % (ref 11.5–15.5)
WBC: 9.4 10*3/uL (ref 4.0–10.5)
nRBC: 0 % (ref 0.0–0.2)

## 2018-02-09 LAB — HIV ANTIBODY (ROUTINE TESTING W REFLEX): HIV SCREEN 4TH GENERATION: NONREACTIVE

## 2018-02-09 LAB — BASIC METABOLIC PANEL
Anion gap: 5 (ref 5–15)
BUN: 22 mg/dL — AB (ref 6–20)
CHLORIDE: 109 mmol/L (ref 98–111)
CO2: 25 mmol/L (ref 22–32)
CREATININE: 0.91 mg/dL (ref 0.44–1.00)
Calcium: 8.5 mg/dL — ABNORMAL LOW (ref 8.9–10.3)
GFR calc Af Amer: >60 mL/min (ref >60)
GLUCOSE: 230 mg/dL — AB (ref 70–99)
POTASSIUM: 4.5 mmol/L (ref 3.5–5.1)
Sodium: 139 mmol/L (ref 135–145)

## 2018-02-09 LAB — VANCOMYCIN, TROUGH: Vancomycin Tr: 9 ug/mL — ABNORMAL LOW (ref 15–20)

## 2018-02-09 LAB — GLUCOSE, CAPILLARY
GLUCOSE-CAPILLARY: 218 mg/dL — AB (ref 70–99)
GLUCOSE-CAPILLARY: 174 mg/dL — AB (ref 70–99)
GLUCOSE-CAPILLARY: 198 mg/dL — AB (ref 70–99)
GLUCOSE-CAPILLARY: 442 mg/dL — AB (ref 70–99)
Glucose-Capillary: 218 mg/dL — ABNORMAL HIGH (ref 70–99)
Glucose-Capillary: 213 mg/dL — ABNORMAL HIGH (ref 70–99)

## 2018-02-09 SURGERY — IRRIGATION AND DEBRIDEMENT EXTREMITY
Anesthesia: General | Site: Thigh | Laterality: Left

## 2018-02-09 MED ORDER — FENTANYL CITRATE (PF) 100 MCG/2ML IJ SOLN
INTRAMUSCULAR | Status: AC
  Filled 2018-02-09: qty 2

## 2018-02-09 MED ORDER — MIDAZOLAM HCL 2 MG/2ML IJ SOLN
INTRAMUSCULAR | Status: AC
  Filled 2018-02-09: qty 2

## 2018-02-09 MED ORDER — HYDROMORPHONE HCL 1 MG/ML IJ SOLN: 0.2500 mg | INTRAMUSCULAR | Status: DC | PRN

## 2018-02-09 MED ORDER — HYDROGEN PEROXIDE 3 % EX SOLN
CUTANEOUS | Status: DC | PRN
  Administered 2018-02-09: 1

## 2018-02-09 MED ORDER — SCOPOLAMINE 1 MG/3DAYS TD PT72
1.0000 | MEDICATED_PATCH | TRANSDERMAL | Status: DC
  Administered 2018-02-09: 1.5 mg via TRANSDERMAL

## 2018-02-09 MED ORDER — MIDAZOLAM HCL 2 MG/2ML IJ SOLN
INTRAMUSCULAR | Status: DC | PRN
  Administered 2018-02-09: 2 mg via INTRAVENOUS

## 2018-02-09 MED ORDER — ONDANSETRON HCL 4 MG/2ML IJ SOLN
INTRAMUSCULAR | Status: AC
  Filled 2018-02-09: qty 2

## 2018-02-09 MED ORDER — DEXAMETHASONE SODIUM PHOSPHATE 10 MG/ML IJ SOLN
INTRAMUSCULAR | Status: AC
  Filled 2018-02-09: qty 1

## 2018-02-09 MED ORDER — PROPOFOL 10 MG/ML IV BOLUS
INTRAVENOUS | Status: DC | PRN
  Administered 2018-02-09: 150 mg via INTRAVENOUS

## 2018-02-09 MED ORDER — DEXMEDETOMIDINE HCL 200 MCG/2ML IV SOLN
INTRAVENOUS | Status: DC | PRN
  Administered 2018-02-09: 12 ug via INTRAVENOUS

## 2018-02-09 MED ORDER — SODIUM CHLORIDE FLUSH 0.9 % IV SOLN
INTRAVENOUS | Status: AC
  Filled 2018-02-09: qty 10

## 2018-02-09 MED ORDER — PROPOFOL 10 MG/ML IV BOLUS
INTRAVENOUS | Status: AC
  Filled 2018-02-09: qty 20

## 2018-02-09 MED ORDER — DEXAMETHASONE SODIUM PHOSPHATE 10 MG/ML IJ SOLN
INTRAMUSCULAR | Status: DC | PRN
  Administered 2018-02-09: 5 mg via INTRAVENOUS

## 2018-02-09 MED ORDER — PROMETHAZINE HCL 25 MG/ML IJ SOLN
6.2500 mg | INTRAMUSCULAR | Status: DC | PRN
  Administered 2018-02-09: 6.25 mg via INTRAVENOUS

## 2018-02-09 MED ORDER — INSULIN ASPART 100 UNIT/ML ~~LOC~~ SOLN
10.0000 [IU] | Freq: Once | SUBCUTANEOUS | Status: AC
  Administered 2018-02-09: 10 [IU] via SUBCUTANEOUS
  Filled 2018-02-09: qty 1

## 2018-02-09 MED ORDER — VANCOMYCIN HCL IN DEXTROSE 1-5 GM/200ML-% IV SOLN
1000.0000 mg | Freq: Two times a day (BID) | INTRAVENOUS | Status: DC
  Administered 2018-02-09 – 2018-02-10 (×3): 1000 mg via INTRAVENOUS
  Filled 2018-02-09 (×4): qty 200

## 2018-02-09 MED ORDER — ACETAMINOPHEN 10 MG/ML IV SOLN
INTRAVENOUS | Status: DC | PRN
  Administered 2018-02-09: 1000 mg via INTRAVENOUS

## 2018-02-09 MED ORDER — PROMETHAZINE HCL 25 MG/ML IJ SOLN
INTRAMUSCULAR | Status: AC
  Filled 2018-02-09: qty 1

## 2018-02-09 MED ORDER — ACETAMINOPHEN 10 MG/ML IV SOLN
INTRAVENOUS | Status: AC
  Filled 2018-02-09: qty 100

## 2018-02-09 MED ORDER — DEXMEDETOMIDINE HCL IN NACL 200 MCG/50ML IV SOLN
INTRAVENOUS | Status: AC
  Filled 2018-02-09: qty 50

## 2018-02-09 MED ORDER — FENTANYL CITRATE (PF) 100 MCG/2ML IJ SOLN
INTRAMUSCULAR | Status: DC | PRN
  Administered 2018-02-09: 25 ug via INTRAVENOUS

## 2018-02-09 MED ORDER — SCOPOLAMINE 1 MG/3DAYS TD PT72
MEDICATED_PATCH | TRANSDERMAL | Status: AC
  Filled 2018-02-09: qty 1

## 2018-02-09 MED ORDER — INSULIN GLARGINE 100 UNIT/ML ~~LOC~~ SOLN
18.0000 [IU] | Freq: Every day | SUBCUTANEOUS | Status: DC
  Administered 2018-02-09: 18 [IU] via SUBCUTANEOUS
  Filled 2018-02-09 (×2): qty 0.18

## 2018-02-09 MED ORDER — ONDANSETRON HCL 4 MG/2ML IJ SOLN
INTRAMUSCULAR | Status: DC | PRN
  Administered 2018-02-09: 4 mg via INTRAVENOUS

## 2018-02-09 SURGICAL SUPPLY — 31 items
BLADE CLIPPER SURG (BLADE) ×3 IMPLANT
BLADE SURG 15 STRL LF DISP TIS (BLADE) ×1 IMPLANT
BLADE SURG 15 STRL SS (BLADE) ×2
BRUSH SCRUB EZ  4% CHG (MISCELLANEOUS) ×2
BRUSH SCRUB EZ 4% CHG (MISCELLANEOUS) ×1 IMPLANT
CANISTER SUCT 3000ML PPV (MISCELLANEOUS) ×3 IMPLANT
COVER WAND RF STERILE (DRAPES) ×3 IMPLANT
DRAPE CHEST BREAST 77X106 FENE (MISCELLANEOUS) IMPLANT
DRAPE LAPAROTOMY 77X122 PED (DRAPES) ×3 IMPLANT
DRSG OPSITE POSTOP 3X4 (GAUZE/BANDAGES/DRESSINGS) ×3 IMPLANT
ELECT REM PT RETURN 9FT ADLT (ELECTROSURGICAL) ×3
ELECTRODE REM PT RTRN 9FT ADLT (ELECTROSURGICAL) ×1 IMPLANT
GAUZE PACKING IODOFORM 1X5 (MISCELLANEOUS) ×3 IMPLANT
GLOVE BIO SURGEON STRL SZ 6.5 (GLOVE) ×2 IMPLANT
GLOVE BIO SURGEONS STRL SZ 6.5 (GLOVE) ×1
GLOVE BIOGEL PI IND STRL 6.5 (GLOVE) ×1 IMPLANT
GLOVE BIOGEL PI INDICATOR 6.5 (GLOVE) ×2
GOWN STRL REUS W/ TWL LRG LVL3 (GOWN DISPOSABLE) ×2 IMPLANT
GOWN STRL REUS W/TWL LRG LVL3 (GOWN DISPOSABLE) ×4
Hydrogen Peroxide ×3 IMPLANT
NEEDLE HYPO 22GX1.5 SAFETY (NEEDLE) ×3 IMPLANT
NS IRRIG 1000ML POUR BTL (IV SOLUTION) ×3 IMPLANT
PACK BASIN MINOR ARMC (MISCELLANEOUS) ×3 IMPLANT
SCRUB POVIDONE IODINE 4 OZ (MISCELLANEOUS) ×3 IMPLANT
SOL PREP PVP 2OZ (MISCELLANEOUS) ×3
SOLUTION PREP PVP 2OZ (MISCELLANEOUS) ×1 IMPLANT
SPONGE GAUZE 2X2 8PLY STER LF (GAUZE/BANDAGES/DRESSINGS) ×2
SPONGE GAUZE 2X2 8PLY STRL LF (GAUZE/BANDAGES/DRESSINGS) ×4 IMPLANT
SPONGE LAP 18X18 RF (DISPOSABLE) ×3 IMPLANT
SWAB CULTURE AMIES ANAERIB BLU (MISCELLANEOUS) ×3 IMPLANT
SYR BULB IRRIG 60ML STRL (SYRINGE) ×3 IMPLANT

## 2018-02-09 NOTE — Anesthesia Preprocedure Evaluation (Addendum)
Anesthesia Evaluation  Patient identified by MRN, date of birth, ID band Patient awake    Reviewed: Allergy & Precautions, H&P , NPO status , Patient's Chart, lab work & pertinent test results  Airway Mallampati: III   Neck ROM: full    Dental  (+) Poor Dentition   Pulmonary neg pulmonary ROS, sleep apnea , Current Smoker,    breath sounds clear to auscultation       Cardiovascular hypertension, + angina + CAD, + Past MI and + Cardiac Stents  + Valvular Problems/Murmurs (murmur)  Rhythm:regular  LHC 06/09/17: 1. Severe single-vessel coronary artery disease with focal 95% in-stent restenosis of the mid RCA, where two layers of stent are already present. There is also moderate stenosis of the mid/distal RCA (unchanged from prior caths) and high grade disease involving the ostial/proximal segment of the PDA (small vessel). 2. Mild to moderate, nonobstructive CAD involving the left coronary artery. 3. Mildly elevated left ventricular filling pressure. 4. Successful balloon angioplasty to in-stent restenosis of the mid RCA with reduction of stenosis from 95% to 0%.  Echo 02/24/17: Left ventricle: The cavity size was normal. Systolic function was   normal. The estimated ejection fraction was in the range of 55%   to 60%. Wall motion was normal; there were no regional wall   motion abnormalities. Doppler parameters are consistent with   abnormal left ventricular relaxation (grade 1 diastolic   dysfunction). - Right ventricle: Systolic function was normal. - Pulmonary arteries: Systolic pressure was within the normal   range.   Neuro/Psych  Headaches, Seizures - (none in years),  PSYCHIATRIC DISORDERS Anxiety Depression Bipolar Disorder CVA, No Residual Symptoms negative psych ROS   GI/Hepatic negative GI ROS, Neg liver ROS, GERD  Controlled,  Endo/Other  negative endocrine ROSdiabetes  Renal/GU Renal disease      Musculoskeletal  (+) Arthritis ,   Abdominal   Peds  Hematology negative hematology ROS (+)   Anesthesia Other Findings Past Medical History: No date: Anxiety No date: Arthritis     Comment:  "qwhere" (06/14/2014) No date: Bipolar disorder (HCC) No date: CAD (coronary artery disease)     Comment:  a. 2008 PCI/DES to RCA; b. 2012 PCI: 95% mRCA s/p               PCI/DES; b. 2/16 PCI DES-> 90% dRCA; c. 11/16 PCI: dRCA               ISR s/p PCI/DES;  d. 4/18 PCI: p-mRCA 95% s/p PCI/DES; e.              02/2017 Cath: LML nl, LAD nl, D1 40, LCX min irregs, OM1               60, RCA 50-60p/m ISR, 59m/d, patent dRCA stent, RPDA               90ost (fills via L->R collats), EF 55-65%. No date: Chronic bronchitis (HCC)     Comment:  "get it q yr" No date: Daily headache     Comment:  "when my blood pressure is up" (06/14/2014) No date: Depression No date: Diastolic dysfunction     Comment:  a. 02/2017 Echo: EF 55-60%, no rwma, Gr1 DD, nl RV fxn. No date: GERD (gastroesophageal reflux disease) No date: Heart murmur No date: High cholesterol No date: History of stomach ulcers No date: Hypertension No date: Insomnia No date: Kidney stones No date: Migraine     Comment:  "haven't had  them in a good while; did get them 1-2               times/yr" (06/14/2014) 2008; ~ 2012: Myocardial infarction [facility]) No date: Pneumonia     Comment:  "get it 1-2 times/year" (06/14/2014) No date: RLS (restless legs syndrome) No date: Seizures (HCC) No date: Sleep apnea     Comment:  "they want me to do the lab but I haven't" (06/14/2014) No date: Stroke [facility])     Comment:  "they say I've had several" (06/14/2014) No date: Tobacco abuse No date: Type II diabetes mellitus (HCC) No date: Urinary, incontinence, stress female  Past Surgical History: 1984: ABDOMINAL HYSTERECTOMY     Comment:  "I've got 1 ovary left" 02/26/2015: CARDIAC CATHETERIZATION; N/A     Comment:  Procedure: Left Heart Cath and  Coronary Angiography;                Surgeon: Iran Ouch, MD;  Location: [facility] INVASIVE               CV LAB;  Service: Cardiovascular;  Laterality: N/A; 02/26/2015: CARDIAC CATHETERIZATION; N/A     Comment:  Procedure: Coronary Stent Intervention;  Surgeon:               Iran Ouch, MD;  Location: [facility] INVASIVE CV LAB;                Service: Cardiovascular;  Laterality: N/A; 1984: CESAREAN SECTION 2008; ~ 2012: CORONARY ANGIOPLASTY WITH STENT PLACEMENT     Comment:  "1; 1" 06/09/2017: CORONARY BALLOON ANGIOPLASTY; N/A     Comment:  Procedure: CORONARY BALLOON ANGIOPLASTY;  Surgeon: Yvonne Kendall, MD;  Location: [facility] INVASIVE CV LAB;                Service: Cardiovascular;  Laterality: N/A; 08/04/2016: CORONARY STENT INTERVENTION; N/A     Comment:  Procedure: Coronary Stent Intervention;  Surgeon:               Iran Ouch, MD;  Location: [facility] INVASIVE CV LAB;                Service: Cardiovascular;  Laterality: N/A; No date: DILATION AND CURETTAGE OF UTERUS X 2: EXTRACORPOREAL SHOCK WAVE LITHOTRIPSY No date: KNEE ARTHROSCOPY; Left 08/04/2016: LEFT HEART CATH; Bilateral     Comment:  Procedure: Left Heart Cath poss PCI;  Surgeon: Iran Ouch, MD;  Location: [facility] INVASIVE CV LAB;  Service:               Cardiovascular;  Laterality: Bilateral; 02/19/2017: LEFT HEART CATH AND CORONARY ANGIOGRAPHY; Left     Comment:  Procedure: LEFT HEART CATH AND CORONARY ANGIOGRAPHY;                Surgeon: Antonieta Iba, MD;  Location: [facility] INVASIVE               CV LAB;  Service: Cardiovascular;  Laterality: Left; 06/09/2017: LEFT HEART CATH AND CORONARY ANGIOGRAPHY; Left     Comment:  Procedure: LEFT HEART CATH AND CORONARY ANGIOGRAPHY;                Surgeon: Yvonne Kendall, MD;  Location: [facility] INVASIVE               CV LAB;  Service: Cardiovascular;  Laterality: Left; 06/15/2014: LEFT HEART CATHETERIZATION WITH CORONARY ANGIOGRAM; N/A      Comment:  Procedure: LEFT HEART CATHETERIZATION WITH CORONARY               ANGIOGRAM;  Surgeon: Corky Crafts, MD;  Location:               [facility] CATH LAB;  Service: Cardiovascular;  Laterality: N/A; 06/15/2014: PERCUTANEOUS CORONARY STENT INTERVENTION (PCI-S)     Comment:  Procedure: PERCUTANEOUS CORONARY STENT INTERVENTION               (PCI-S);  Surgeon: Corky Crafts, MD;  Location: [facility]              CATH LAB;  Service: Cardiovascular;; No date: TONSILLECTOMY No date: TUBAL LIGATION  BMI    Body Mass Index:  38.78 kg/m      Reproductive/Obstetrics negative OB ROS                           Anesthesia Physical Anesthesia Plan  ASA: IV  Anesthesia Plan: General LMA   Post-op Pain Management:    Induction:   PONV Risk Score and Plan: Ondansetron, Dexamethasone and Scopolamine patch - Pre-op  Airway Management Planned:   Additional Equipment:   Intra-op Plan:   Post-operative Plan:   Informed Consent: I have reviewed the patients History and Physical, chart, labs and discussed the procedure including the risks, benefits and alternatives for the proposed anesthesia with the patient or authorized representative who has indicated his/her understanding and acceptance.   Dental Advisory Given  Plan Discussed with: Anesthesiologist, CRNA and Surgeon  Anesthesia Plan Comments:        Anesthesia Quick Evaluation

## 2018-02-09 NOTE — Op Note (Signed)
Preoperative diagnosis: Left thigh abscess   Postoperative diagnosis: Left thigh abscess  Procedure: Incision and drainage of left thigh abscess.  Anesthesia: General  Surgeon: Dr. Hazle Quant, MD  Wound Classification: Contaminated  Indications: Patient is a 57 y.o. female  presented with an abscess on the left thigh that has not been responsive to IV antibiotic  Findings:  1. Abscess on the left thigh 2. abundant purulent secretions drained and cultured 3. Adequate hemostasis.   Description of procedure: The patient was placed in the supine position and general anesthesia was induced. The left thigh was prepped and draped in the usual sterile fashion. A timeout was completed verifying correct patient, procedure, site, positioning, and implant(s) and/or special equipment prior to beginning this procedure.  A skin incision was made the left thigh fluctuant area. The wound was opened and purulent secretions was drained. With a hemostat blunt dissection of septas was done to be able to drain the abscess completely. The cavity was irrigated with peroxide. The cavity flushed with saline until clear saline was aspirated. The wound was packed with an iodine packing and wound left opened. Sterile dressing left in place.  The patient tolerated the procedure well and was taken to the postanesthesia care unit in satisfactory condition.   Specimen: left thigh abscess culture   Complications: None  Estimated Blood Loss: Minimal

## 2018-02-09 NOTE — Transfer of Care (Signed)
Immediate Anesthesia Transfer of Care Note  Patient: Jennifer [name]  Procedure(s) Performed: IRRIGATION AND DEBRIDEMENT EXTREMITY (Left Thigh)  Patient Location: PACU  Anesthesia Type:General  Level of Consciousness: sedated and responds to stimulation  Airway & Oxygen Therapy: Patient Spontanous Breathing and Patient connected to face mask oxygen  Post-op Assessment: Report given to RN and Post -op Vital signs reviewed and stable  Post vital signs: Reviewed and stable  Last Vitals:  Vitals Value Taken Time  BP 115/75 02/09/2018  1:15 PM  Temp 35.9 C 02/09/2018  1:15 PM  Pulse 68 02/09/2018  1:15 PM  Resp 11 02/09/2018  1:15 PM  SpO2 100 % 02/09/2018  1:15 PM    Last Pain:  Vitals:   02/09/18 1155  TempSrc: Temporal  PainSc: 6       Patients Stated Pain Goal: 4 (02/08/18 0444)  Complications: No apparent anesthesia complications

## 2018-02-09 NOTE — Progress Notes (Signed)
SURGICAL PROGRESS NOTE    Interval History: Patient seen and examined, no acute events or new complaints overnight. Patient reports feeling the same with swollen leg. Refers fever.  Vital signs in last 24 hours: [min-max] current  Temp:  [97.4 F (36.3 C)-100.1 F (37.8 C)] 97.4 F (36.3 C) (10/22 0753) Pulse Rate:  [77-98] 88 (10/22 0819) Resp:  [18] 18 (10/22 0753) BP: (112-131)/(62-91) 128/87 (10/22 0819) SpO2:  [95 %-97 %] 95 % (10/22 0753)     Height: 5\' 2"  (157.5 cm) Weight: 96.2 kg BMI (Calculated): 38.77    Physical Exam:  Constitutional: alert, cooperative and no distress  Extremity: left thigh redness with purulent secretions and area of fluctuance.   Labs:  CBC Latest Ref Rng & Units 02/09/2018 02/08/2018 02/07/2018  WBC 4.0 - 10.5 K/uL 9.4 14.5(H) 19.0(H)  Hemoglobin 12.0 - 15.0 g/dL 10.9(L) 11.2(L) 11.9(L)  Hematocrit 36.0 - 46.0 % 33.8(L) 35.2(L) 37.1  Platelets 150 - 400 K/uL 258 260 272   CMP Latest Ref Rng & Units 02/09/2018 02/08/2018 02/07/2018  Glucose 70 - 99 mg/dL 409(W) 119(J) 478(G)  BUN 6 - 20 mg/dL 95(A) 21(H) 08(M)  Creatinine 0.44 - 1.00 mg/dL 5.78 4.69(G) 2.95(M)  Sodium 135 - 145 mmol/L 139 134(L) 140  Potassium 3.5 - 5.1 mmol/L 4.5 4.8 3.7  Chloride 98 - 111 mmol/L 109 101 105  CO2 22 - 32 mmol/L 25 25 27   Calcium 8.9 - 10.3 mg/dL 8.4(X) 3.2(G) 4.0(N)  Total Protein 6.5 - 8.1 g/dL - - -  Total Bilirubin 0.3 - 1.2 mg/dL - - -  Alkaline Phos 38 - 126 U/L - - -  AST 15 - 41 U/L - - -  ALT 0 - 44 U/L - - -    Imaging studies: No new pertinent imaging studies   Assessment/Plan:  57 y.o.femalewith left leg cellulitis, complicated by pertinent comorbidities includingcoronary artery disease status post multiple stent (on Plavix and aspirin), COPD, morbid obesity, smoker, DM, history of stroke. Left thigh area with persistent cellulitis and purulent secretion. Will take to OR for incision and drainage.   Gae Gallop, MD

## 2018-02-09 NOTE — Anesthesia Procedure Notes (Signed)
Procedure Name: LMA Insertion Date/Time: 02/09/2018 12:37 PM Performed by: Omer Jack, CRNA Pre-anesthesia Checklist: Patient identified, Patient being monitored, Timeout performed, Emergency Drugs available and Suction available Patient Re-evaluated:Patient Re-evaluated prior to induction Oxygen Delivery Method: Circle system utilized Preoxygenation: Pre-oxygenation with 100% oxygen Induction Type: IV induction Ventilation: Mask ventilation without difficulty LMA: LMA inserted LMA Size: 3.5 Tube type: Oral Number of attempts: 1 Placement Confirmation: positive ETCO2 and breath sounds checked- equal and bilateral Tube secured with: Tape Dental Injury: Teeth and Oropharynx as per pre-operative assessment

## 2018-02-09 NOTE — Progress Notes (Signed)
Pharmacy Antibiotic Note  Jennifer [name] is a 57 y.o. female admitted on 02/06/2018. Patient with cellulitis of left inner thigh and concern for sepsis. Pharmacy has been consulted for Unasyn and vancomycin dosing.  No abscess noted 10/21.  Plan: Unasyn 3 g IV q6h  Vancomycin 1 g IV given. Will order 750 mg IV q12h to start tonight at 2000. Goal trough (sepsis) 15-20 mcg/mL, however will aim for lower end of trough as likely source is cellulitis. Trough ordered for 10/22 at 0730.  10/22: Vancomycin trough @0703 = 9 mcg/ml.  Will transition to Vancomycin 1 gram IV q12h for goal trough 10-15 for cellulitis, no abscess.  Watch for accumulation in obese patient. Will f/u next trough prior to 5th dose of new regimen on 10/24.    Height: 5\' 2"  (157.5 cm) Weight: 212 lb (96.2 kg) IBW/kg (Calculated) : 50.1  Temp (24hrs), Avg:98.8 F (37.1 C), Min:97.4 F (36.3 C), Max:100.1 F (37.8 C)  Recent Labs  Lab 02/06/18 2237 02/07/18 0023 02/07/18 0220 02/08/18 0638 02/09/18 0703  WBC 21.6*  --  19.0* 14.5* 9.4  CREATININE 0.98  --  1.15* 1.15* 0.91  LATICACIDVEN  --  0.9 1.0  --   --   VANCOTROUGH  --   --   --   --  9*    Estimated Creatinine Clearance: 76.4 mL/min (by C-G formula based on SCr of 0.91 mg/dL).    Allergies  Allergen Reactions  . Carbamazepine Anaphylaxis and Other (See Comments)    Blood dyscrasia when on with Lithium  . Lithium Other (See Comments)    Blood dyscrasia  . Lodine [Etodolac] Shortness Of Breath, Diarrhea, Nausea And Vomiting and Rash  . Codeine Other (See Comments)    Upset stomach  . Levofloxacin In D5w Swelling and Other (See Comments)    "Salty" sensation in mouth. "Hard time to explain it"  . Methocarbamol Other (See Comments)    Unknown  . Tape Other (See Comments)    Welps, blister on skin  . Relafen [Nabumetone] Diarrhea, Nausea And Vomiting and Rash    Antimicrobials this admission: Unasyn 10/20 >> Vancomycin 10/20 >>  Dose  adjustments this admission: 10/22 Vanc T 0703= 9 mcg/ml. Dose chg from 750mg  q12h to 1 gm q12h  Microbiology results: 10/20 BCx: NG 2 D 10/21 Wound cx: NGTD  Thank you for allowing pharmacy to be a part of this patient's care.  Bari Mantis PharmD Clinical Pharmacist 02/09/2018

## 2018-02-09 NOTE — Anesthesia Post-op Follow-up Note (Signed)
Anesthesia QCDR form completed.        

## 2018-02-09 NOTE — Progress Notes (Addendum)
[facility]   PATIENT NAME: Jennifer [name]    MR#:  [id number]  DATE OF BIRTH:  1960/06/16  SUBJECTIVE:  CHIEF COMPLAINT:   Chief Complaint  Patient presents with  . Wound Check   -Not much improvement in left thigh cellulitis and abscess.  Going to or for I&D today  REVIEW OF SYSTEMS:  Review of Systems  Constitutional: Negative for chills, fever and malaise/fatigue.  HENT: Negative for congestion, ear discharge, hearing loss and nosebleeds.   Eyes: Negative for blurred vision and double vision.  Respiratory: Negative for cough, shortness of breath and wheezing.   Cardiovascular: Negative for chest pain and palpitations.  Gastrointestinal: Negative for abdominal pain, constipation, diarrhea, nausea and vomiting.  Genitourinary: Negative for dysuria and urgency.  Musculoskeletal: Positive for myalgias.  Neurological: Negative for dizziness, focal weakness, seizures, weakness and headaches.  Psychiatric/Behavioral: Negative for depression.    DRUG ALLERGIES:   Allergies  Allergen Reactions  . Carbamazepine Anaphylaxis and Other (See Comments)    Blood dyscrasia when on with Lithium  . Lithium Other (See Comments)    Blood dyscrasia  . Lodine [Etodolac] Shortness Of Breath, Diarrhea, Nausea And Vomiting and Rash  . Codeine Other (See Comments)    Upset stomach  . Levofloxacin In D5w Swelling and Other (See Comments)    "Salty" sensation in mouth. "Hard time to explain it"  . Methocarbamol Other (See Comments)    Unknown  . Tape Other (See Comments)    Welps, blister on skin  . Relafen [Nabumetone] Diarrhea, Nausea And Vomiting and Rash    VITALS:  Blood pressure 126/71, pulse 65, temperature (!) 97.3 F (36.3 C), resp. rate 11, height 5\' 2"  (1.575 m), weight 96.2 kg, SpO2 97 %.  PHYSICAL EXAMINATION:  Physical Exam  GENERAL:  57 y.o.-year-old obese patient lying in the bed with no acute distress.  EYES: Pupils equal,  round, reactive to light and accommodation. No scleral icterus. Extraocular muscles intact.  HEENT: Head atraumatic, normocephalic. Oropharynx and nasopharynx clear.  NECK:  Supple, no jugular venous distention. No thyroid enlargement, no tenderness.  LUNGS: Normal breath sounds bilaterally, no wheezing, rales,rhonchi or crepitation. No use of accessory muscles of respiration.  Decreased bibasilar breath sounds. CARDIOVASCULAR: S1, S2 normal. No murmurs, rubs, or gallops.  ABDOMEN: Soft, nontender, nondistended. Bowel sounds present. No organomegaly or mass.  EXTREMITIES: left inner thigh with erythema and tenderness.  There is an indurated area more proximally.  No pedal edema, cyanosis, or clubbing.  NEUROLOGIC: Cranial nerves II through XII are intact. Muscle strength 5/5 in all extremities. Sensation intact. Gait not checked.  PSYCHIATRIC: The patient is alert and oriented x 3.  SKIN: No obvious rash, lesion, or ulcer.    LABORATORY PANEL:   CBC Recent Labs  Lab 02/09/18 0703  WBC 9.4  HGB 10.9*  HCT 33.8*  PLT 258   ------------------------------------------------------------------------------------------------------------------  Chemistries  Recent Labs  Lab 02/06/18 2237  02/09/18 0703  NA 139   < > 139  K 3.9   < > 4.5  CL 105   < > 109  CO2 25   < > 25  GLUCOSE 228*   < > 230*  BUN 22*   < > 22*  CREATININE 0.98   < > 0.91  CALCIUM 9.0   < > 8.5*  AST 14*  --   --   ALT 15  --   --   ALKPHOS 69  --   --  BILITOT 0.6  --   --    < > = values in this interval not displayed.   ------------------------------------------------------------------------------------------------------------------  Cardiac Enzymes No results for input(s): TROPONINI in the last 168 hours. ------------------------------------------------------------------------------------------------------------------  RADIOLOGY:  No results found.  EKG:   Orders placed or performed in visit on  06/17/17  . EKG 12-Lead    ASSESSMENT AND PLAN:   57 year old female with obesity, diastolic CHF, COPD with ongoing smoking, CAD, bipolar disorder, diabetes and hypertension presents to hospital secondary to worsening left inner thigh erythema warmth and tenderness.  1.  Sepsis-secondary to left inner thigh cellulitis and abscess -started after a bug/spider bite, has a significant area of induration and tenderness. -Continue IV antibiotics-vancomycin and Unasyn -Surgery consult requested -for I&D today.  Follow-up cultures. Blood cultures are negative -WBC has improved after IV antibiotics  2.  Hypertension-  blood pressure is low normal, will discontinue lisinopril and decrease dose of metoprolol for now.  3.  Chronic diastolic CHF-well compensated.  DC IV fluids and restart her home dose of Lasix today  4.  Diabetes mellitus- Lantus - adjust home dose.  Victoza is on hold while inpatient. -Continue sliding scale insulin  5.  History of CAD-continue aspirin, Plavix and statin.  PCI in feb 2019  6.  DVT prophylaxis-on Lovenox  Patient is up and ambulatory Follow-up wound cultures and follow-up with surgery about possible discharge in the next day or 2   All the records are reviewed and case discussed with Care Management/Social Workerr. Management plans discussed with the patient, family and they are in agreement.   CODE STATUS: Full code  TOTAL TIME TAKING CARE OF THIS PATIENT: 37 minutes.   POSSIBLE D/C IN 1-2 DAYS, DEPENDING ON CLINICAL CONDITION.   Enid Baas M.D on 02/09/2018 at 2:47 PM  Between 7am to 6pm - Pager - [phone]  After 6pm go to [email or web address] - Social research officer, government  [facility]  Office  [phone]  CC: Primary care physician; Charleen Kirks, MD

## 2018-02-10 ENCOUNTER — Other Ambulatory Visit: Payer: Self-pay | Admitting: Physician Assistant

## 2018-02-10 LAB — BASIC METABOLIC PANEL
Anion gap: 7 (ref 5–15)
BUN: 20 mg/dL (ref 6–20)
CO2: 28 mmol/L (ref 22–32)
Calcium: 8.6 mg/dL — ABNORMAL LOW (ref 8.9–10.3)
Chloride: 106 mmol/L (ref 98–111)
Creatinine, Ser: 0.68 mg/dL (ref 0.44–1.00)
Glucose, Bld: 286 mg/dL — ABNORMAL HIGH (ref 70–99)
POTASSIUM: 4.5 mmol/L (ref 3.5–5.1)
SODIUM: 141 mmol/L (ref 135–145)

## 2018-02-10 LAB — GLUCOSE, CAPILLARY
GLUCOSE-CAPILLARY: 296 mg/dL — AB (ref 70–99)
GLUCOSE-CAPILLARY: 325 mg/dL — AB (ref 70–99)
Glucose-Capillary: 271 mg/dL — ABNORMAL HIGH (ref 70–99)

## 2018-02-10 LAB — CBC
HCT: 34.6 % — ABNORMAL LOW (ref 36.0–46.0)
Hemoglobin: 11.1 g/dL — ABNORMAL LOW (ref 12.0–15.0)
MCH: 31.6 pg (ref 26.0–34.0)
MCHC: 32.1 g/dL (ref 30.0–36.0)
MCV: 98.6 fL (ref 80.0–100.0)
NRBC: 0 % (ref 0.0–0.2)
PLATELETS: 295 10*3/uL (ref 150–400)
RBC: 3.51 MIL/uL — AB (ref 3.87–5.11)
RDW: 14.6 % (ref 11.5–15.5)
WBC: 12.3 10*3/uL — AB (ref 4.0–10.5)

## 2018-02-10 MED ORDER — ACETAMINOPHEN 325 MG PO TABS: 650.0000 mg | ORAL_TABLET | Freq: Four times a day (QID) | ORAL | Status: DC | PRN

## 2018-02-10 MED ORDER — METOPROLOL SUCCINATE ER 50 MG PO TB24: 50.0000 mg | ORAL_TABLET | Freq: Every day | ORAL | 0 refills | Status: DC

## 2018-02-10 MED ORDER — DOCUSATE SODIUM 100 MG PO CAPS: 100.0000 mg | ORAL_CAPSULE | Freq: Two times a day (BID) | ORAL | Status: DC

## 2018-02-10 MED ORDER — NICOTINE 14 MG/24HR TD PT24: 14.0000 mg | MEDICATED_PATCH | Freq: Every day | TRANSDERMAL | 0 refills | Status: DC

## 2018-02-10 MED ORDER — INSULIN GLARGINE 100 UNIT/ML ~~LOC~~ SOLN
30.0000 [IU] | Freq: Every day | SUBCUTANEOUS | Status: DC
  Filled 2018-02-10: qty 0.3

## 2018-02-10 MED ORDER — OXYCODONE HCL 5 MG PO TABS: 5.0000 mg | ORAL_TABLET | Freq: Four times a day (QID) | ORAL | 0 refills | Status: DC | PRN

## 2018-02-10 MED ORDER — POLYETHYLENE GLYCOL 3350 17 G PO PACK
17.0000 g | PACK | Freq: Every day | ORAL | Status: DC
  Administered 2018-02-10: 17 g via ORAL
  Filled 2018-02-10: qty 1

## 2018-02-10 MED ORDER — CEPHALEXIN 500 MG PO CAPS
500.0000 mg | ORAL_CAPSULE | Freq: Four times a day (QID) | ORAL | Status: DC
  Administered 2018-02-10 (×2): 500 mg via ORAL
  Filled 2018-02-10: qty 1

## 2018-02-10 MED ORDER — CEPHALEXIN 500 MG PO CAPS: 500.0000 mg | ORAL_CAPSULE | Freq: Four times a day (QID) | ORAL | 0 refills | Status: AC

## 2018-02-10 MED ORDER — INSULIN ASPART 100 UNIT/ML ~~LOC~~ SOLN
4.0000 [IU] | Freq: Three times a day (TID) | SUBCUTANEOUS | Status: DC
  Administered 2018-02-10: 4 [IU] via SUBCUTANEOUS
  Filled 2018-02-10: qty 1

## 2018-02-10 NOTE — Discharge Instructions (Signed)
Follow-up with primary care physician in 2 to 3 days Follow-up with general surgery Dr. Maia Plan in a week Follow-up with diabetic clinic outpatient in 3 to 4 days

## 2018-02-10 NOTE — Discharge Summary (Addendum)
[facility] NAME: Jennifer [name]    MR#:  [id number]  DATE OF BIRTH:  September 27, 1960  DATE OF ADMISSION:  02/06/2018 ADMITTING PHYSICIAN: Lance Coon, MD  DATE OF DISCHARGE:  02/10/18   PRIMARY CARE PHYSICIAN: Jeronimo Greaves, MD    ADMISSION DIAGNOSIS:  Cellulitis of left lower extremity [T51.761]  DISCHARGE DIAGNOSIS:  SEPSIS Thigh-cellulitis with abscess that is post I&D  SECONDARY DIAGNOSIS:   Past Medical History:  Diagnosis Date  . Anxiety   . Arthritis    "qwhere" (06/14/2014)  . Bipolar disorder ([place])   . CAD (coronary artery disease)    a. 2008 PCI/DES to RCA; b. 2012 PCI: 95% mRCA s/p PCI/DES; b. 2/16 PCI DES-> 90% dRCA; c. 11/16 PCI: dRCA ISR s/p PCI/DES;  d. 4/18 PCI: p-mRCA 95% s/p PCI/DES; e. 02/2017 Cath: LML nl, LAD nl, D1 40, LCX min irregs, OM1 60, RCA 50-60p/m ISR, 21md, patent dRCA stent, RPDA 90ost (fills via Jennifer->R collats), EF 55-65%.  . Chronic bronchitis ([place]    "get it q yr"  . Daily headache    "when my blood pressure is up" (06/14/2014)  . Depression   . Diastolic dysfunction    a. 02/2017 Echo: EF 55-60%, no rwma, Gr1 DD, nl RV fxn.  .Marland KitchenGERD (gastroesophageal reflux disease)   . Heart murmur   . High cholesterol   . History of stomach ulcers   . Hypertension   . Insomnia   . Kidney stones   . Migraine    "haven't had them in a good while; did get them 1-2 times/yr" (06/14/2014)  . Myocardial infarction ([place] 2008; ~ 2012  . Pneumonia    "get it 1-2 times/year" (06/14/2014)  . RLS (restless legs syndrome)   . Seizures ([place]   . Sleep apnea    "they want me to do the lab but I haven't" (06/14/2014)  . Stroke ([facility]    "they say I've had several" (06/14/2014)  . Tobacco abuse   . Type II diabetes mellitus ([place]   . Urinary, incontinence, stress female     HOSPITAL COURSE:   HPI  LNazaret [name] is a 57y.o. female who presents with chief complaint as above.  She presents with 2 days of  worsening left inner thigh erythema, warmth, tenderness, and induration.  She states that this started initially with a central area that she thought was a bug bite.  She tried to "squeeze the pus out of it", but did not get much.  Since then the erythema has spread in the tenderness and induration have gotten worse.  Here in the ED tonight she was found to have an elevated white blood cell count, she was tachycardic on presentation.  Hospitalist were called for admission  1.  Sepsis-secondary to left inner thigh cellulitis and abscess -started after a bug/spider bite, has a significant area of induration and tenderness. -Patient has received IV antibiotics vancomycin and Unasyn during the hospital course for 4 days -Status post incision and drainage on 02/09/2018.  Doing much better today -Wound culture from 10/21 Staphylococcus aureus, pansensitive except ciprofloxacin; wound culture from 10/22 is growing Staphylococcus aureus -Antibiotics changed to p.o. Keflex, oxycodone as needed for pain -.  Follow-up cultures. Blood cultures are negative -WBC has improved after IV antibiotics  2.  Hypertension-  blood pressure is low normal, will discontinue lisinopril and decrease dose of metoprolol to 50 mg for now.  3.  Chronic  diastolic CHF-well compensated.  DC IV fluids and restart her home dose of Lasix today  4.  Diabetes mellitus- Lantus 30 units nightly with a sliding scale coverage while patient is in the hospital .  Victoza at home medication is on hold while inpatient, home meds will be resumed at the time of discharge -sliding scale insulin  5.  History of CAD-continue aspirin, Plavix and statin.  PCI in feb 2019  6.  DVT prophylaxis-on Lovenox  DISCHARGE CONDITIONS:   fair  CONSULTS OBTAINED:  Treatment Team:  Herbert Pun, MD   PROCEDURES I&D of the abscess  DRUG ALLERGIES:   Allergies  Allergen Reactions  . Carbamazepine Anaphylaxis and Other (See  Comments)    Blood dyscrasia when on with Lithium  . Lithium Other (See Comments)    Blood dyscrasia  . Lodine [Etodolac] Shortness Of Breath, Diarrhea, Nausea And Vomiting and Rash  . Codeine Other (See Comments)    Upset stomach  . Levofloxacin In D5w Swelling and Other (See Comments)    "Salty" sensation in mouth. "Hard time to explain it"  . Methocarbamol Other (See Comments)    Unknown  . Tape Other (See Comments)    Welps, blister on skin  . Relafen [Nabumetone] Diarrhea, Nausea And Vomiting and Rash    DISCHARGE MEDICATIONS:   Allergies as of 02/10/2018      Reactions   Carbamazepine Anaphylaxis, Other (See Comments)   Blood dyscrasia when on with Lithium   Lithium Other (See Comments)   Blood dyscrasia   Lodine [etodolac] Shortness Of Breath, Diarrhea, Nausea And Vomiting, Rash   Codeine Other (See Comments)   Upset stomach   Levofloxacin In D5w Swelling, Other (See Comments)   "Salty" sensation in mouth. "Hard time to explain it"   Methocarbamol Other (See Comments)   Unknown   Tape Other (See Comments)   Welps, blister on skin   Relafen [nabumetone] Diarrhea, Nausea And Vomiting, Rash      Medication List    STOP taking these medications   lisinopril 40 MG tablet Commonly known as:  PRINIVIL,ZESTRIL     TAKE these medications   acetaminophen 325 MG tablet Commonly known as:  TYLENOL Take 2 tablets (650 mg total) by mouth every 6 (six) hours as needed for mild pain (or Fever >/= 101).   albuterol 108 (90 Base) MCG/ACT inhaler Commonly known as:  PROVENTIL HFA;VENTOLIN HFA Inhale 1-2 puffs into the lungs every 4 (four) hours as needed for wheezing or shortness of breath.   aspirin 81 MG EC tablet Take 1 tablet (81 mg total) by mouth daily.   atorvastatin 80 MG tablet Commonly known as:  LIPITOR Take 80 mg by mouth at bedtime. What changed:  Another medication with the same name was removed. Continue taking this medication, and follow the directions  you see here.   Blood Glucose Monitoring Suppl w/Device Kit Test 1-2 times daily; E11.65 diagnosis   cephALEXin 500 MG capsule Commonly known as:  KEFLEX Take 1 capsule (500 mg total) by mouth every 6 (six) hours for 10 days.   clopidogrel 75 MG tablet Commonly known as:  PLAVIX Take 75 mg by mouth once daily with breakfast   cyclobenzaprine 10 MG tablet Commonly known as:  FLEXERIL Take 10 mg by mouth 3 (three) times daily as needed for muscle spasms.   furosemide 40 MG tablet Commonly known as:  LASIX Take 40 mg by mouth daily.   gabapentin 600 MG tablet Commonly known as:  NEURONTIN Take 900 mg by mouth 4 (four) times daily.   metoprolol succinate 50 MG 24 hr tablet Commonly known as:  TOPROL-XL Take 1 tablet (50 mg total) by mouth daily. Take with or immediately following a meal. Start taking on:  02/11/2018 What changed:    medication strength  how much to take  how to take this  when to take this  additional instructions   nicotine 14 mg/24hr patch Commonly known as:  NICODERM CQ - dosed in mg/24 hours Place 1 patch (14 mg total) onto the skin daily. Start taking on:  02/11/2018   oxyCODONE 5 MG immediate release tablet Commonly known as:  Oxy IR/ROXICODONE Take 1 tablet (5 mg total) by mouth every 6 (six) hours as needed for moderate pain or severe pain.   pantoprazole 40 MG tablet Commonly known as:  PROTONIX TAKE 1 TABLET(40 MG) BY MOUTH DAILY What changed:  See the new instructions.   polyethylene glycol packet Commonly known as:  MIRALAX / GLYCOLAX Take 17 g by mouth daily as needed for mild constipation.   RANEXA 1000 MG SR tablet Generic drug:  ranolazine TAKE 1 TABLET BY MOUTH TWICE DAILY( EVERY TWELVE HOURS)   TRESIBA FLEXTOUCH 200 UNIT/ML Sopn Generic drug:  Insulin Degludec Inject 66 Units into the skin daily.   VICTOZA 18 MG/3ML Sopn Generic drug:  liraglutide Inject 1.8 mg into the skin daily.        DISCHARGE INSTRUCTIONS:    Follow-up with primary care physician in 2 to 3 days Follow-up with general surgery Dr. Peyton Najjar in a week Follow-up with diabetic clinic outpatient in 3 to 4 days   DIET:  Cardiac diet and Diabetic diet  DISCHARGE CONDITION:  Fair  ACTIVITY:  Activity as tolerated  OXYGEN:  Home Oxygen: No.   Oxygen Delivery: room air  DISCHARGE LOCATION:  home   If you experience worsening of your admission symptoms, develop shortness of breath, life threatening emergency, suicidal or homicidal thoughts you must seek medical attention immediately by calling 911 or calling your MD immediately  if symptoms less severe.  You Must read complete instructions/literature along with all the possible adverse reactions/side effects for all the Medicines you take and that have been prescribed to you. Take any new Medicines after you have completely understood and accpet all the possible adverse reactions/side effects.   Please note  You were cared for by a hospitalist during your hospital stay. If you have any questions about your discharge medications or the care you received while you were in the hospital after you are discharged, you can call the unit and asked to speak with the hospitalist on call if the hospitalist that took care of you is not available. Once you are discharged, your primary care physician will handle any further medical issues. Please note that NO REFILLS for any discharge medications will be authorized once you are discharged, as it is imperative that you return to your primary care physician (or establish a relationship with a primary care physician if you do not have one) for your aftercare needs so that they can reassess your need for medications and monitor your lab values.     Today  Chief Complaint  Patient presents with  . Wound Check   Patient is doing fine.  Wants to go home.  Swelling of the thigh significantly improved after incision and drainage  ROS:   CONSTITUTIONAL: Denies fevers, chills. Denies any fatigue, weakness.  EYES: Denies blurry vision, double vision,  eye pain. EARS, NOSE, THROAT: Denies tinnitus, ear pain, hearing loss. RESPIRATORY: Denies cough, wheeze, shortness of breath.  CARDIOVASCULAR: Denies chest pain, palpitations, edema.  GASTROINTESTINAL: Denies nausea, vomiting, diarrhea, abdominal pain. Denies bright red blood per rectum. GENITOURINARY: Denies dysuria, hematuria. ENDOCRINE: Denies nocturia or thyroid problems. HEMATOLOGIC AND LYMPHATIC: Denies easy bruising or bleeding. SKIN: Left thigh abscess with decreased swelling and pain MUSCULOSKELETAL: Denies pain in neck, back, shoulder, knees, hips or arthritic symptoms.  NEUROLOGIC: Denies paralysis, paresthesias.  PSYCHIATRIC: Denies anxiety or depressive symptoms.   VITAL SIGNS:  Blood pressure 128/86, pulse 96, temperature 97.9 F (36.6 C), temperature source Oral, resp. rate 18, height _0  (1.575 m), weight 96.2 kg, SpO2 95 %.  I/O:    Intake/Output Summary (Last 24 hours) at 02/10/2018 1431 Last data filed at 02/10/2018 1000 Gross per 24 hour  Intake 1263.41 ml  Output -  Net 1263.41 ml    PHYSICAL EXAMINATION:  GENERAL:  57 y.o.-year-old patient lying in the bed with no acute distress.  EYES: Pupils equal, round, reactive to light and accommodation. No scleral icterus. Extraocular muscles intact.  HEENT: Head atraumatic, normocephalic. Oropharynx and nasopharynx clear.  NECK:  Supple, no jugular venous distention. No thyroid enlargement, no tenderness.  LUNGS: Normal breath sounds bilaterally, no wheezing, rales,rhonchi or crepitation. No use of accessory muscles of respiration.  CARDIOVASCULAR: S1, S2 normal. No murmurs, rubs, or gallops.  ABDOMEN: Soft, non-tender, non-distended. Bowel sounds present. [name]  EXTREMITIES: No pedal edema, cyanosis, or clubbing.  NEUROLOGIC: Cranial nerves II through XII are intact. Muscle strength 5/5 in all  extremities. Sensation intact. Gait not checked.  PSYCHIATRIC: The patient is alert and oriented x 3.  SKIN: Left thigh abscess status post incision and drainage with decreased edema and tenderness   DATA REVIEW:   CBC Recent Labs  Lab 02/10/18 0618  WBC 12.3*  HGB 11.1*  HCT 34.6*  PLT 295    Chemistries  Recent Labs  Lab 02/06/18 2237  02/10/18 0618  NA 139   < > 141  K 3.9   < > 4.5  CL 105   < > 106  CO2 25   < > 28  GLUCOSE 228*   < > 286*  BUN 22*   < > 20  CREATININE 0.98   < > 0.68  CALCIUM 9.0   < > 8.6*  AST 14*  --   --   ALT 15  --   --   ALKPHOS 69  --   --   BILITOT 0.6  --   --    < > = values in this interval not displayed.    Cardiac Enzymes No results for input(s): TROPONINI in the last 168 hours.  Microbiology Results  Results for orders placed or performed during the hospital encounter of 02/06/18  Culture, blood (x 2)     Status: None (Preliminary result)   Collection Time: 02/06/18 11:31 PM  Result Value Ref Range Status   Specimen Description BLOOD RIGHT ANTECUBITAL  Final   Special Requests   Final    BOTTLES DRAWN AEROBIC AND ANAEROBIC Blood Culture adequate volume   Culture   Final    NO GROWTH 3 DAYS Performed at [facility], [address], [place], [place] [address]    Report Status PENDING  Incomplete  Culture, blood (x 2)     Status: None (Preliminary result)   Collection Time: 02/07/18 12:23 AM  Result Value Ref Range Status  Specimen Description BLOOD BLOOD RIGHT HAND  Final   Special Requests   Final    BOTTLES DRAWN AEROBIC AND ANAEROBIC Blood Culture adequate volume   Culture   Final    NO GROWTH 3 DAYS Performed at [facility], [address], [place], [place] [address]    Report Status PENDING  Incomplete  Aerobic/Anaerobic Culture (surgical/deep wound)     Status: None (Preliminary result)   Collection Time: 02/08/18 11:55 AM  Result Value Ref Range Status   Specimen Description   Final     WOUND Performed at [facility], [address] [place], [place] [address]    Special Requests   Final    Normal Performed at [facility], [address] [place], [place] [address]    Gram Stain   Final    NO WBC SEEN NO ORGANISMS SEEN Performed at [facility], [place] [address] [place], [place] [address]    Culture   Final    MODERATE STAPHYLOCOCCUS AUREUS NO ANAEROBES ISOLATED; CULTURE IN PROGRESS FOR 5 DAYS    Report Status PENDING  Incomplete   Organism ID, Bacteria STAPHYLOCOCCUS AUREUS  Final      Susceptibility   Staphylococcus aureus - MIC*    CIPROFLOXACIN >=8 RESISTANT Resistant     ERYTHROMYCIN <=0.25 SENSITIVE Sensitive     GENTAMICIN <=0.5 SENSITIVE Sensitive     OXACILLIN 0.5 SENSITIVE Sensitive     TETRACYCLINE <=1 SENSITIVE Sensitive     VANCOMYCIN <=0.5 SENSITIVE Sensitive     TRIMETH/SULFA <=10 SENSITIVE Sensitive     CLINDAMYCIN <=0.25 SENSITIVE Sensitive     RIFAMPIN <=0.5 SENSITIVE Sensitive     Inducible Clindamycin NEGATIVE Sensitive     * MODERATE STAPHYLOCOCCUS AUREUS  Aerobic/Anaerobic Culture (surgical/deep wound)     Status: None (Preliminary result)   Collection Time: 02/09/18  1:01 PM  Result Value Ref Range Status   Specimen Description   Final    LEG Performed at [facility], [address] [place], [place] [address]    Special Requests   Final    LEFT THIGH Performed at [facility], [address] [place], [place] [address]    Gram Stain   Final    FEW WBC PRESENT,BOTH PMN AND MONONUCLEAR RARE GRAM POSITIVE COCCI    Culture   Final    FEW STAPHYLOCOCCUS AUREUS SUSCEPTIBILITIES TO FOLLOW Performed at [facility], [place] [address] [place], [place] [address]    Report Status PENDING  Incomplete    RADIOLOGY:  No results found.  EKG:   Orders placed or performed in visit on 06/17/17  . EKG 12-Lead      Management plans discussed with the patient, family and they are  in agreement.  CODE STATUS:     Code Status Orders  (From admission, onward)         Start     Ordered   02/07/18 0048  Full code  Continuous     02/07/18 0047        Code Status History    Date Active Date Inactive Code Status Order ID Comments User Context   06/09/2017 1529 06/10/2017 1608 Full Code [id number]  Nelva Bush, MD Inpatient   02/19/2017 1204 02/19/2017 1651 Full Code [id number]  Minna Merritts, MD Inpatient   08/02/2016 0753 08/05/2016 1628 Full Code [id number]  Saundra Shelling, MD Inpatient   02/26/2015 1350 02/27/2015 1651 Full Code [id number]  Wellington Hampshire,  MD Inpatient   02/23/2015 0819 02/26/2015 1350 Full Code [id number]  Harrie Foreman, MD Inpatient   06/15/2014 1229 06/16/2014 1754 Full Code [id number]  Jettie Booze, MD Inpatient   06/14/2014 0847 06/15/2014 1229 Full Code [id number]  Karen Kitchens Inpatient   06/14/2014 0725 06/14/2014 0847 Full Code [id number]  Louellen Molder, MD Inpatient      TOTAL TIME TAKING CARE OF THIS PATIENT: 43  minutes.   Note: This dictation was prepared with Dragon dictation along with smaller phrase technology. Any transcriptional errors that result from this process are unintentional.   _0 @  on 02/10/2018 at 2:31 PM  Between 7am to 6pm - Pager - [phone]  After 6pm go to [email or web address] - password [email or web address] [facility]  Office  [phone]  CC: Primary care physician; Jeronimo Greaves, MD

## 2018-02-10 NOTE — Anesthesia Postprocedure Evaluation (Signed)
Anesthesia Post Note  Patient: Jennifer [name]  Procedure(s) Performed: IRRIGATION AND DEBRIDEMENT EXTREMITY (Left Thigh)  Patient location during evaluation: PACU Anesthesia Type: General Level of consciousness: awake and alert Pain management: pain level controlled Vital Signs Assessment: post-procedure vital signs reviewed and stable Respiratory status: spontaneous breathing, nonlabored ventilation, respiratory function stable and patient connected to nasal cannula oxygen Cardiovascular status: blood pressure returned to baseline and stable Postop Assessment: no apparent nausea or vomiting Anesthetic complications: no     Last Vitals:  Vitals:   02/09/18 2322 02/10/18 0744  BP: (!) 157/86 128/86  Pulse: 87 96  Resp: 16 18  Temp: 36.4 C 36.6 C  SpO2: 98% 95%    Last Pain:  Vitals:   02/10/18 0820  TempSrc:   PainSc: 8                  [name]

## 2018-02-10 NOTE — Progress Notes (Addendum)
Inpatient Diabetes Program Recommendations  AACE/ADA: New Consensus Statement on Inpatient Glycemic Control (2015)  Target Ranges:  Prepandial:   less than 140 mg/dL      Peak postprandial:   less than 180 mg/dL (1-2 hours)      Critically ill patients:  140 - 180 mg/dL   Results for Jennifer [name], Jennifer [name] (MRN [id number]) as of 02/10/2018 12:10  Ref. Range 02/09/2018 07:52 02/09/2018 11:30 02/09/2018 12:04 02/09/2018 13:20 02/09/2018 16:54 02/09/2018 21:00  Glucose-Capillary Latest Ref Range: 70 - 99 mg/dL 782 (H)  3 units NOVOLOG  213 (H) 218 (H)    5 mg of Decadron 174 (H) 198 (H)  2 units NOVOLOG  442 (H)  10 units NOVOLOG +  18 units LANTUS   Results for Jennifer [name], Jennifer [name] (MRN [id number]) as of 02/10/2018 12:10  Ref. Range 02/10/2018 07:40 02/10/2018 12:03  Glucose-Capillary Latest Ref Range: 70 - 99 mg/dL 578 (H)  5 units NOVOLOG  325 (H)     Home DM Meds: Tresiba (insulin degludec) 66 units Daily        Victoza 1.8 mg Daily  Current: Orders: Lantus 18 units QHS       Novolog Sensitive Correction Scale/ SSI (0-9 units) TID AC + HS      Underwent I&D yesterday.    Received 5 mg Decadron X 1 dose yesterday at 12:41pm.  CBGs extremely elevated last PM and again so far today.  Takes much larger dose of basal insulin at home.  Needs better glycemic control to help with healing.     MD- Please consider the following in-hospital insulin adjustments:  1. Increase Lantus to 30 units QHS (50% home dose)  2. Start Novolog Meal Coverage: Novolog 4 units TID with meals   (Please add the following Hold Parameters: Hold if pt eats <50% of meal, Hold if pt NPO)     --Will follow patient during hospitalization--  Ambrose Finland RN, MSN, CDE Diabetes Coordinator Inpatient Glycemic Control Team Team Pager: [phone] (8a-5p)

## 2018-02-12 LAB — CULTURE, BLOOD (ROUTINE X 2)
CULTURE: NO GROWTH
Culture: NO GROWTH
Special Requests: ADEQUATE
Special Requests: ADEQUATE

## 2018-02-13 LAB — AEROBIC/ANAEROBIC CULTURE (SURGICAL/DEEP WOUND)

## 2018-02-13 LAB — AEROBIC/ANAEROBIC CULTURE W GRAM STAIN (SURGICAL/DEEP WOUND)
Gram Stain: NONE SEEN
Special Requests: NORMAL

## 2018-02-14 LAB — AEROBIC/ANAEROBIC CULTURE (SURGICAL/DEEP WOUND)

## 2018-02-14 LAB — AEROBIC/ANAEROBIC CULTURE W GRAM STAIN (SURGICAL/DEEP WOUND)

## 2018-02-16 ENCOUNTER — Telehealth: Payer: Self-pay

## 2018-02-16 NOTE — Telephone Encounter (Signed)
EMMI Follow-up: Jennifer [name] called as she had received a automated call but was sleeping so she was calling back to see why she received the call.  I explained our process of two automated calls post discharge and we were checking to see how she was doing.  Said she had some swelling in her feet and legs but didn't think it was from the wound.  She hadn't made her follow-up appointments yet as she was working on getting transportation. Said she was doing okay and no needs noted for today.

## 2018-02-18 ENCOUNTER — Telehealth: Payer: Self-pay

## 2018-02-18 NOTE — Telephone Encounter (Signed)
EMMI Follow-up: Noted on the report that the patient responded no to taking medications.  I talked with Jennifer [name] and she is taking her medications and that the automated system didn't take the correct response.  She had some issues with feet swelling but looks better today. Reminded her to report to PCP should she have any issues with swelling.  Thanked me for calling. No needs noted.

## 2018-02-22 ENCOUNTER — Other Ambulatory Visit: Payer: Self-pay | Admitting: Physician Assistant

## 2018-07-16 ENCOUNTER — Telehealth: Payer: Self-pay

## 2018-07-16 NOTE — Telephone Encounter (Signed)
Virtual Visit Pre-Appointment Phone Call  Steps For Call:  1. Confirm consent - "In the setting of the current Covid19 crisis, you are scheduled for a TELEPHONE visit with your provider on 07/20/2018 at 1:20pm.  Just as we do with many in-office visits, in order for you to participate in this visit, we must obtain consent.  If you'd like, I can send this to your mychart (if signed up) or email for you to review.  Otherwise, I can obtain your verbal consent now.  All virtual visits are billed to your insurance company just like a normal visit would be.  By agreeing to a virtual visit, we'd like you to understand that the technology does not allow for your provider to perform an examination, and thus may limit your provider's ability to fully assess your condition.  Finally, though the technology is pretty good, we cannot assure that it will always work on either your or our end, and in the setting of a video visit, we may have to convert it to a phone-only visit.  In either situation, we cannot ensure that we have a secure connection.  Are you willing to proceed?"  2. Give patient instructions for WebEx download to smartphone as below if video visit  3. Advise patient to be prepared with any vital sign or heart rhythm information, their current medicines, and a piece of paper and pen handy for any instructions they may receive the day of their visit  4. Inform patient they will receive a phone call 15 minutes prior to their appointment time (may be from unknown caller ID) so they should be prepared to answer  5. Confirm that appointment type is correct in Epic appointment notes (video vs telephone)    TELEPHONE CALL NOTE  Jennifer [name] has been deemed a candidate for a follow-up tele-health visit to limit community exposure during the Covid-19 pandemic. I spoke with the patient via phone to ensure availability of phone/video source, confirm preferred email & phone number, and discuss  instructions and expectations.  I reminded Jennifer [name] to be prepared with any vital sign and/or heart rhythm information that could potentially be obtained via home monitoring, at the time of her visit. I reminded Jennifer [name] to expect a phone call at the time of her visit if her visit.  Did the patient verbally acknowledge consent to treatment? YES  Margrett Rud, [place] 07/16/2018 2:44 PM   CONSENT FOR TELE-HEALTH VISIT - PLEASE REVIEW  I hereby voluntarily request, consent and authorize [facility] and its employed or contracted physicians, physician assistants, nurse practitioners or other licensed health care professionals (the Practitioner), to provide me with telemedicine health care services (the Services") as deemed necessary by the treating Practitioner. I acknowledge and consent to receive the Services by the Practitioner via telemedicine. I understand that the telemedicine visit will involve communicating with the Practitioner through live audiovisual communication technology and the disclosure of certain medical information by electronic transmission. I acknowledge that I have been given the opportunity to request an in-person assessment or other available alternative prior to the telemedicine visit and am voluntarily participating in the telemedicine visit.  I understand that I have the right to withhold or withdraw my consent to the use of telemedicine in the course of my care at any time, without affecting my right to future care or treatment, and that the Practitioner or I may terminate the telemedicine visit at any time. I understand that I  have the right to inspect all information obtained and/or recorded in the course of the telemedicine visit and may receive copies of available information for a reasonable fee.  I understand that some of the potential risks of receiving the Services via telemedicine include:   Delay or interruption in medical evaluation due to  technological equipment failure or disruption;  Information transmitted may not be sufficient (e.g. poor resolution of images) to allow for appropriate medical decision making by the Practitioner; and/or   In rare instances, security protocols could fail, causing a breach of personal health information.  Furthermore, I acknowledge that it is my responsibility to provide information about my medical history, conditions and care that is complete and accurate to the best of my ability. I acknowledge that Practitioner's advice, recommendations, and/or decision may be based on factors not within their control, such as incomplete or inaccurate data provided by me or distortions of diagnostic images or specimens that may result from electronic transmissions. I understand that the practice of medicine is not an exact science and that Practitioner makes no warranties or guarantees regarding treatment outcomes. I acknowledge that I will receive a copy of this consent concurrently upon execution via email to the email address I last provided but may also request a printed copy by calling the office of [place].    I understand that my insurance will be billed for this visit.   I have read or had this consent read to me.  I understand the contents of this consent, which adequately explains the benefits and risks of the Services being provided via telemedicine.   I have been provided ample opportunity to ask questions regarding this consent and the Services and have had my questions answered to my satisfaction.  I give my informed consent for the services to be provided through the use of telemedicine in my medical care  By participating in this telemedicine visit I agree to the above.

## 2018-07-19 NOTE — Progress Notes (Addendum)
Virtual Visit via Telephone Note   This visit type was conducted due to national recommendations for restrictions regarding the COVID-19 Pandemic (e.g. social distancing) in an effort to limit this patient's exposure and mitigate transmission in our community.  Due to her co-morbid illnesses, this patient is at least at moderate risk for complications without adequate follow up.  This format is felt to be most appropriate for this patient at this time.  The patient did not have access to video technology/had technical difficulties with video requiring transitioning to audio format only (telephone).  All issues noted in this document were discussed and addressed.  No physical exam could be performed with this format.  Please refer to the patient's chart for her  consent to telehealth for [facility].    Date:  07/20/2018   ID:  Jennifer [name], DOB [birth year], MRN [id number]  Patient Location:  [place] [address]   Provider location:   [facility], [place] office  PCP:  Jeronimo Greaves, MD  Cardiologist:  No primary care provider on file.   Chief Complaint:      History of Present Illness:    Jennifer [name] is a 58 y.o. female who presents via audio/video conferencing for a telehealth visit today.   The patient does not symptoms concerning for COVID-19 infection (fever, chills, cough, or new SHORTNESS OF BREATH).  Please refer to prior office visit for complete details: Patient has a past medical history of  Poorly controlled diabetes type 2, hemoglobin A1c 13 Active smoker coronary artery disease, stent to the mid RCA 2008, repeat stenting to the mid RCA 2012 ,   catheterization February 2016 showing severe distal RCA disease estimated at 90%, patent mid RCA stents, also with 70% mid to distal circumflex disease of a small vessel,  Catheterization April 2018 stent to the right Catheterization November 2018 medical management recommended presenting for routine  follow-up of her coronary artery disease  Cardiac catheterization November 2018 Medical management recommended for moderate proximal RCA disease, left circumflex disease  Echocardiogram done November 2018 Normal LV function  cardiac catheterization April 2018 for unstable angina, minimal troponin elevation,  Procedure April 2018 with ISR of RCA stent proximal to mid Vessel PCI with resolute onyx 3.5 x 30 mm  New events since her prior clinic visit URI, sinus, H/A, SOB, cough,  Coughing spells, Exposure to grandson, PMD did a phone visit, [facility], on 07/15/18 ABX, prednisone, tessalon pearls thick sputum Not getting much sleep  No chest pain Active at baseline, takes care of her grandchild   Prior CV studies:   The following studies were reviewed today:  Echo: 02/2017 Left ventricle: The cavity size was normal. Systolic function was   normal. The estimated ejection fraction was in the range of 55%   to 60%. Wall motion was normal; there were no regional wall   motion abnormalities. Doppler parameters are consistent with   abnormal left ventricular relaxation (grade 1 diastolic   dysfunction). - Right ventricle: Systolic function was normal. - Pulmonary arteries: Systolic pressure was within the normal  range.   Past Medical History:  Diagnosis Date  . Anxiety   . Arthritis    "qwhere" (06/14/2014)  . Bipolar disorder ([place])   . CAD (coronary artery disease)    a. 2008 PCI/DES to RCA; b. 2012 PCI: 95% mRCA s/p PCI/DES; b. 2/16 PCI DES-> 90% dRCA; c. 11/16 PCI: dRCA ISR s/p PCI/DES;  d. 4/18 PCI: p-mRCA  95% s/p PCI/DES; e. 02/2017 Cath: LML nl, LAD nl, D1 40, LCX min irregs, OM1 60, RCA 50-60p/m ISR, 7md, patent dRCA stent, RPDA 90ost (fills via L->R collats), EF 55-65%.  . [place]itis [place]    "get it q yr"  . Daily headache    "when my blood pressure is up" (06/14/2014)  . Depression   . Diastolic dysfunction    a. 02/2017 Echo: EF 55-60%, no rwma, Gr1 DD, nl  RV fxn.  .Marland KitchenGERD (gastroesophageal reflux disease)   . Heart murmur   . High cholesterol   . History of stomach ulcers   . Hypertension   . Insomnia   . Kidney stones   . Migraine    "haven't had them in a good while; did get them 1-2 times/yr" (06/14/2014)  . Myocardial infarction ([place] 2008; ~ 2012  . Pneumonia    "get it 1-2 times/year" (06/14/2014)  . RLS (restless legs syndrome)   . Seizures ([place]   . Sleep apnea    "they want me to do the lab but I haven't" (06/14/2014)  . Stroke ([facility]    "they say I've had several" (06/14/2014)  . Tobacco abuse   . Type II diabetes mellitus ([place]   . Urinary, incontinence, stress female    Past Surgical History:  Procedure Laterality Date  . ABDOMINAL HYSTERECTOMY  1984   "I've got 1 ovary left"  . CARDIAC CATHETERIZATION N/A 02/26/2015   Procedure: Left Heart Cath and Coronary Angiography;  Surgeon: MWellington Hampshire MD;  [facility] [place];  Service: Cardiovascular;  Laterality: N/A;  . CARDIAC CATHETERIZATION N/A 02/26/2015   Procedure: Coronary Stent Intervention;  Surgeon: MWellington Hampshire MD;  [place] [facility];  Service: Cardiovascular;  Laterality: N/A;  . CESAREAN SECTION  1984  . CORONARY ANGIOPLASTY WITH STENT PLACEMENT  2008; ~ 2012   "1; 1"  . CORONARY BALLOON ANGIOPLASTY N/A 06/09/2017   Procedure: CORONARY BALLOON ANGIOPLASTY;  Surgeon: ENelva Bush MD;  [facility];  Service: Cardiovascular;  Laterality: N/A;  . CORONARY STENT INTERVENTION N/A 08/04/2016   Procedure: Coronary Stent Intervention;  Surgeon: MWellington Hampshire MD;  [facility];  Service: Cardiovascular;  Laterality: N/A;  . DILATION AND CURETTAGE OF UTERUS    . EXTRACORPOREAL SHOCK WAVE LITHOTRIPSY  X 2  . I&D EXTREMITY Left 02/09/2018   Procedure: IRRIGATION AND DEBRIDEMENT EXTREMITY;  Surgeon: CHerbert Pun MD;  Location: [facility] ORS;  Service: General;  Laterality: Left;  . KNEE  ARTHROSCOPY Left   . LEFT HEART CATH Bilateral 08/04/2016   Procedure: Left Heart Cath poss PCI;  Surgeon: MWellington Hampshire MD;  [place];  Service: Cardiovascular;  Laterality: Bilateral;  . LEFT HEART CATH AND CORONARY ANGIOGRAPHY Left 02/19/2017   Procedure: LEFT HEART CATH AND CORONARY ANGIOGRAPHY;  Surgeon: GMinna Merritts MD;  [place];  Service: Cardiovascular;  Laterality: Left;  . LEFT HEART CATH AND CORONARY ANGIOGRAPHY Left 06/09/2017   Procedure: LEFT HEART CATH AND CORONARY ANGIOGRAPHY;  Surgeon: ENelva Bush MD;  [facility];  Service: Cardiovascular;  Laterality: Left;  . LEFT HEART CATHETERIZATION WITH CORONARY ANGIOGRAM N/A 06/15/2014   Procedure: LEFT HEART CATHETERIZATION WITH CORONARY ANGIOGRAM;  Surgeon: JJettie Booze MD;  [facility];  Service: Cardiovascular;  Laterality: N/A;  . PERCUTANEOUS CORONARY STENT INTERVENTION (PCI-S)  06/15/2014   Procedure: PERCUTANEOUS CORONARY STENT INTERVENTION (PCI-S);  Surgeon: Jettie Booze, MD;  Location: [facility] CATH LAB;  Service: Cardiovascular;;  . TONSILLECTOMY    . TUBAL LIGATION       Current Meds  Medication Sig  . acetaminophen (TYLENOL) 325 MG tablet Take 2 tablets (650 mg total) by mouth every 6 (six) hours as needed for mild pain (or Fever >/= 101).  [name] aspirin EC 81 MG EC tablet Take 1 tablet (81 mg total) by mouth daily.  [name] atorvastatin (LIPITOR) 80 MG tablet Take 80 mg by mouth at bedtime.   . Blood Glucose Monitoring Suppl W/DEVICE KIT Test 1-2 times daily; E11.65 diagnosis  . clopidogrel (PLAVIX) 75 MG tablet Take 75 mg by mouth once daily with breakfast  . cyclobenzaprine (FLEXERIL) 10 MG tablet Take 10 mg by mouth 3 (three) times daily as needed for muscle spasms.   . furosemide (LASIX) 40 MG tablet Take 40 mg by mouth daily.  [name] gabapentin (NEURONTIN) 600 MG tablet Take 900 mg by mouth 4 (four) times daily.   [name] liraglutide (VICTOZA)  18 MG/3ML SOPN Inject 1.8 mg into the skin daily.   . metoprolol succinate (TOPROL-XL) 50 MG 24 hr tablet Take 1 tablet (50 mg total) by mouth daily. Take with or immediately following a meal.  . nicotine (NICODERM CQ - DOSED IN MG/24 HOURS) 14 mg/24hr patch Place 1 patch (14 mg total) onto the skin daily.  [name] oxyCODONE (OXY IR/ROXICODONE) 5 MG immediate release tablet Take 1 tablet (5 mg total) by mouth every 6 (six) hours as needed for moderate pain or severe pain.  . pantoprazole (PROTONIX) 40 MG tablet TAKE 1 TABLET(40 MG) BY MOUTH DAILY (Patient taking differently: Take 40 mg by mouth daily. )  . polyethylene glycol (MIRALAX / GLYCOLAX) packet Take 17 g by mouth daily as needed for mild constipation.  [name] RANEXA 1000 MG SR tablet TAKE 1 TABLET BY MOUTH TWICE DAILY( EVERY TWELVE HOURS)  . TRESIBA FLEXTOUCH 200 UNIT/ML SOPN Inject 66 Units into the skin daily.     Allergies:   Carbamazepine; Lithium; Lodine [etodolac]; Codeine; Levofloxacin in d5w; Methocarbamol; Tape; and Relafen [nabumetone]   Social History   Tobacco Use  . Smoking status: Current Every Day Smoker    Packs/day: 0.25    Years: 35.00    Pack years: 8.75    Types: Cigarettes  . Smokeless tobacco: Never Used  . Tobacco comment: Patches  Substance Use Topics  . Alcohol use: Yes    Alcohol/week: 0.0 standard drinks    Comment: occ  . Drug use: Yes    Types: Marijuana     Current Outpatient Medications on File Prior to Visit  Medication Sig Dispense Refill  . acetaminophen (TYLENOL) 325 MG tablet Take 2 tablets (650 mg total) by mouth every 6 (six) hours as needed for mild pain (or Fever >/= 101).    [name] aspirin EC 81 MG EC tablet Take 1 tablet (81 mg total) by mouth daily. 30 tablet 2  . atorvastatin (LIPITOR) 80 MG tablet Take 80 mg by mouth at bedtime.     . Blood Glucose Monitoring Suppl W/DEVICE KIT Test 1-2 times daily; E11.65 diagnosis 1 each 0  . clopidogrel (PLAVIX) 75 MG tablet Take 75 mg by mouth once daily  with breakfast 90 tablet 3  . cyclobenzaprine (FLEXERIL) 10 MG tablet Take 10 mg by mouth 3 (three) times daily as needed for muscle spasms.     . furosemide (LASIX) 40 MG tablet Take 40 mg by mouth  daily.  3  . gabapentin (NEURONTIN) 600 MG tablet Take 900 mg by mouth 4 (four) times daily.   0  . liraglutide (VICTOZA) 18 MG/3ML SOPN Inject 1.8 mg into the skin daily.     . metoprolol succinate (TOPROL-XL) 50 MG 24 hr tablet Take 1 tablet (50 mg total) by mouth daily. Take with or immediately following a meal. 30 tablet 0  . nicotine (NICODERM CQ - DOSED IN MG/24 HOURS) 14 mg/24hr patch Place 1 patch (14 mg total) onto the skin daily. 28 patch 0  . oxyCODONE (OXY IR/ROXICODONE) 5 MG immediate release tablet Take 1 tablet (5 mg total) by mouth every 6 (six) hours as needed for moderate pain or severe pain. 20 tablet 0  . pantoprazole (PROTONIX) 40 MG tablet TAKE 1 TABLET(40 MG) BY MOUTH DAILY (Patient taking differently: Take 40 mg by mouth daily. ) 30 tablet 0  . polyethylene glycol (MIRALAX / GLYCOLAX) packet Take 17 g by mouth daily as needed for mild constipation.    [name] RANEXA 1000 MG SR tablet TAKE 1 TABLET BY MOUTH TWICE DAILY( EVERY TWELVE HOURS) 180 tablet 0  . TRESIBA FLEXTOUCH 200 UNIT/ML SOPN Inject 66 Units into the skin daily.     No current facility-administered medications on file prior to visit.      Family Hx: The patient's family history includes Cirrhosis in her father; Lung cancer in her mother.  ROS:   Please see the history of present illness.    Review of Systems  Constitutional: Negative.   Respiratory: Positive for cough, shortness of breath and wheezing.   Cardiovascular: Negative.   Gastrointestinal: Negative.   Musculoskeletal: Negative.   Neurological: Negative.   Psychiatric/Behavioral: Negative.   All other systems reviewed and are negative.     Labs/Other Tests and Data Reviewed:    Recent Labs: 02/06/2018: ALT 15 02/10/2018: BUN 20; Creatinine, Ser  0.68; Hemoglobin 11.1; Platelets 295; Potassium 4.5; Sodium 141   Recent Lipid Panel Lab Results  Component Value Date/Time   CHOL 156 06/18/2017 10:17 AM   TRIG 107 06/18/2017 10:17 AM   HDL 41 06/18/2017 10:17 AM   CHOLHDL 3.8 06/18/2017 10:17 AM   LDLCALC 94 06/18/2017 10:17 AM    Wt Readings from Last 3 Encounters:  02/09/18 212 lb (96.2 kg)  01/10/18 200 lb (90.7 kg)  06/17/17 208 lb 8 oz (94.6 kg)     Exam:    Vital Signs:  Ht '5\' 2"'$  (1.575 m)   BMI 38.78 kg/m    Well nourished, well developed female in no acute distress.   ASSESSMENT & PLAN:    Coronary artery disease of native artery of native heart with stable angina pectoris (HCC) Currently with no symptoms of angina. No further workup at this time. Continue current medication regimen.  Chronic obstructive pulmonary disease, unspecified COPD type ([place]) Continues to smoke, smoking cessation recommended Recent diagnosis of acute bronchitis, struggling  Poorly controlled type 2 diabetes mellitus with complication ([place]) We have encouraged continued exercise, careful diet management in an effort to lose weight.  Essential hypertension No changes made to the medications.  Mixed hyperlipidemia Recommend she stay on her  cholesterol medication LDL February 2019 above goal Recommended weight loss, better diabetes control  Tobacco abuse Smoking cessation recommended  Acute bronchitis, unspecified organism Recently started on antibiotics Tessalon Perles and prednisone Has thick green sputum  Morbid obesity ([place]) Recommended low carbohydrate diet    COVID-19 Education: The signs and symptoms of COVID-19 were  discussed with the patient and how to seek care for testing (follow up with PCP or arrange E-visit).  The importance of social distancing was discussed today.  Patient Risk:   After full review of this patients clinical status, I feel that they are at least moderate risk at this time.  Time:   Today,  I have spent 25 minutes with the patient with telehealth technology discussing smoking cessation, treatment of acute bronchitis, stable angina symptoms.     Medication Adjustments/Labs and Tests Ordered: Current medicines are reviewed at length with the patient today.  Concerns regarding medicines are outlined above.   Tests Ordered: No tests ordered   Medication Changes: No changes made   Disposition: Follow-up in 6 months   Signed, [name], MD  07/20/2018 1:37 PM    [place] Office [address] [place] [id number], [place], [place] [address]

## 2018-07-20 ENCOUNTER — Telehealth (INDEPENDENT_AMBULATORY_CARE_PROVIDER_SITE_OTHER): Payer: Medicare Other | Admitting: Cardiovascular Disease

## 2018-07-20 ENCOUNTER — Encounter: Payer: Self-pay | Admitting: Cardiovascular Disease

## 2018-07-20 ENCOUNTER — Other Ambulatory Visit: Payer: Self-pay

## 2018-07-20 VITALS — Ht 62.0 in

## 2018-07-20 DIAGNOSIS — J209 Acute bronchitis, unspecified: Secondary | ICD-10-CM

## 2018-07-20 DIAGNOSIS — I10 Essential (primary) hypertension: Secondary | ICD-10-CM

## 2018-07-20 DIAGNOSIS — I25118 Atherosclerotic heart disease of native coronary artery with other forms of angina pectoris: Secondary | ICD-10-CM | POA: Diagnosis not present

## 2018-07-20 DIAGNOSIS — E1165 Type 2 diabetes mellitus with hyperglycemia: Secondary | ICD-10-CM

## 2018-07-20 DIAGNOSIS — E118 Type 2 diabetes mellitus with unspecified complications: Secondary | ICD-10-CM

## 2018-07-20 DIAGNOSIS — I200 Unstable angina: Secondary | ICD-10-CM | POA: Diagnosis not present

## 2018-07-20 DIAGNOSIS — E782 Mixed hyperlipidemia: Secondary | ICD-10-CM

## 2018-07-20 DIAGNOSIS — Z720 Tobacco use: Secondary | ICD-10-CM

## 2018-07-20 DIAGNOSIS — J449 Chronic obstructive pulmonary disease, unspecified: Secondary | ICD-10-CM | POA: Diagnosis not present

## 2018-07-20 MED ORDER — RANEXA 1000 MG PO TB12: ORAL_TABLET | ORAL | 3 refills | Status: DC

## 2018-07-20 MED ORDER — METOPROLOL SUCCINATE ER 50 MG PO TB24: 50.0000 mg | ORAL_TABLET | Freq: Every day | ORAL | 3 refills | Status: DC

## 2018-07-20 NOTE — Addendum Note (Signed)
Addended by: Bryna Colander on: 07/20/2018 02:11 PM   Modules accepted: Orders

## 2018-07-20 NOTE — Progress Notes (Signed)
12 m recall entered patient to call   Printed to sabrina to mail

## 2018-07-20 NOTE — Patient Instructions (Addendum)
Medication Instructions:  Refills sent in on metoprolol and ranexa   If you need a refill on your cardiac medications before your next appointment, please call your pharmacy.    Lab work: No new labs needed   If you have labs (blood work) drawn today and your tests are completely normal, you will receive your results only by: [name] MyChart Message (if you have MyChart) OR . A paper copy in the mail If you have any lab test that is abnormal or we need to change your treatment, we will call you to review the results.   Testing/Procedures: No new testing needed   Follow-Up: At [facility], you and your health needs are our priority.  As part of our continuing mission to provide you with exceptional heart care, we have created designated Provider Care Teams.  These Care Teams include your primary Cardiologist (physician) and Advanced Practice Providers (APPs -  Physician Assistants and Nurse Practitioners) who all work together to provide you with the care you need, when you need it.  . You will need a follow up appointment in 12 months .   Please call our office 2 months in advance to schedule this appointment.    . Providers on your designated Care Team:   . Nicolasa Ducking, NP . Eula Listen, PA-C . Marisue Ivan, PA-C  Any Other Special Instructions Will Be Listed Below (If Applicable).  For educational health videos Log in to : [email or web address] Or : [email or web address], password : [email or web address]

## 2018-07-24 ENCOUNTER — Emergency Department: Payer: Medicare Other

## 2018-07-24 ENCOUNTER — Inpatient Hospital Stay
Admission: EM | Admit: 2018-07-24 | Discharge: 2018-07-25 | DRG: 191 | Disposition: A | Discharge status: Home / Self Care | Payer: Medicare Other | Attending: Internal Medicine | Admitting: Internal Medicine

## 2018-07-24 ENCOUNTER — Other Ambulatory Visit: Payer: Self-pay

## 2018-07-24 ENCOUNTER — Encounter: Payer: Self-pay | Admitting: Emergency Medicine

## 2018-07-24 DIAGNOSIS — I2510 Atherosclerotic heart disease of native coronary artery without angina pectoris: Secondary | ICD-10-CM | POA: Diagnosis not present

## 2018-07-24 DIAGNOSIS — G4700 Insomnia, unspecified: Secondary | ICD-10-CM | POA: Diagnosis not present

## 2018-07-24 DIAGNOSIS — E119 Type 2 diabetes mellitus without complications: Secondary | ICD-10-CM | POA: Diagnosis not present

## 2018-07-24 DIAGNOSIS — Z955 Presence of coronary angioplasty implant and graft: Secondary | ICD-10-CM

## 2018-07-24 DIAGNOSIS — M159 Polyosteoarthritis, unspecified: Secondary | ICD-10-CM | POA: Diagnosis not present

## 2018-07-24 DIAGNOSIS — F319 Bipolar disorder, unspecified: Secondary | ICD-10-CM | POA: Diagnosis not present

## 2018-07-24 DIAGNOSIS — G43909 Migraine, unspecified, not intractable, without status migrainosus: Secondary | ICD-10-CM | POA: Diagnosis present

## 2018-07-24 DIAGNOSIS — G2581 Restless legs syndrome: Secondary | ICD-10-CM | POA: Diagnosis present

## 2018-07-24 DIAGNOSIS — E7800 Pure hypercholesterolemia, unspecified: Secondary | ICD-10-CM | POA: Diagnosis not present

## 2018-07-24 DIAGNOSIS — I252 Old myocardial infarction: Secondary | ICD-10-CM | POA: Diagnosis not present

## 2018-07-24 DIAGNOSIS — J440 Chronic obstructive pulmonary disease with acute lower respiratory infection: Secondary | ICD-10-CM | POA: Diagnosis present

## 2018-07-24 DIAGNOSIS — J441 Chronic obstructive pulmonary disease with (acute) exacerbation: Secondary | ICD-10-CM | POA: Diagnosis present

## 2018-07-24 DIAGNOSIS — J209 Acute bronchitis, unspecified: Secondary | ICD-10-CM | POA: Diagnosis present

## 2018-07-24 DIAGNOSIS — Z881 Allergy status to other antibiotic agents status: Secondary | ICD-10-CM | POA: Diagnosis not present

## 2018-07-24 DIAGNOSIS — I10 Essential (primary) hypertension: Secondary | ICD-10-CM | POA: Diagnosis present

## 2018-07-24 DIAGNOSIS — Z7982 Long term (current) use of aspirin: Secondary | ICD-10-CM | POA: Diagnosis not present

## 2018-07-24 DIAGNOSIS — Z91048 Other nonmedicinal substance allergy status: Secondary | ICD-10-CM

## 2018-07-24 DIAGNOSIS — Z885 Allergy status to narcotic agent status: Secondary | ICD-10-CM | POA: Diagnosis not present

## 2018-07-24 DIAGNOSIS — N179 Acute kidney failure, unspecified: Secondary | ICD-10-CM | POA: Diagnosis present

## 2018-07-24 DIAGNOSIS — G4730 Sleep apnea, unspecified: Secondary | ICD-10-CM | POA: Diagnosis not present

## 2018-07-24 DIAGNOSIS — Z20828 Contact with and (suspected) exposure to other viral communicable diseases: Secondary | ICD-10-CM | POA: Diagnosis present

## 2018-07-24 DIAGNOSIS — I959 Hypotension, unspecified: Secondary | ICD-10-CM | POA: Diagnosis present

## 2018-07-24 DIAGNOSIS — R059 Cough, unspecified: Secondary | ICD-10-CM

## 2018-07-24 DIAGNOSIS — K219 Gastro-esophageal reflux disease without esophagitis: Secondary | ICD-10-CM | POA: Diagnosis present

## 2018-07-24 DIAGNOSIS — Z8673 Personal history of transient ischemic attack (TIA), and cerebral infarction without residual deficits: Secondary | ICD-10-CM | POA: Diagnosis not present

## 2018-07-24 DIAGNOSIS — Z801 Family history of malignant neoplasm of trachea, bronchus and lung: Secondary | ICD-10-CM

## 2018-07-24 DIAGNOSIS — R05 Cough: Secondary | ICD-10-CM | POA: Diagnosis not present

## 2018-07-24 DIAGNOSIS — E860 Dehydration: Secondary | ICD-10-CM | POA: Diagnosis present

## 2018-07-24 DIAGNOSIS — Z7902 Long term (current) use of antithrombotics/antiplatelets: Secondary | ICD-10-CM

## 2018-07-24 DIAGNOSIS — F17210 Nicotine dependence, cigarettes, uncomplicated: Secondary | ICD-10-CM | POA: Diagnosis present

## 2018-07-24 DIAGNOSIS — Z888 Allergy status to other drugs, medicaments and biological substances status: Secondary | ICD-10-CM | POA: Diagnosis not present

## 2018-07-24 DIAGNOSIS — R011 Cardiac murmur, unspecified: Secondary | ICD-10-CM | POA: Diagnosis present

## 2018-07-24 LAB — CBC WITH DIFFERENTIAL/PLATELET
Abs Immature Granulocytes: 0.05 10*3/uL (ref 0.00–0.07)
Basophils Absolute: 0 10*3/uL (ref 0.0–0.1)
Basophils Relative: 0 %
Eosinophils Absolute: 0.3 10*3/uL (ref 0.0–0.5)
Eosinophils Relative: 2 %
HCT: 41.3 % (ref 36.0–46.0)
Hemoglobin: 13.8 g/dL (ref 12.0–15.0)
Immature Granulocytes: 0 %
Lymphocytes Relative: 23 %
Lymphs Abs: 3.1 10*3/uL (ref 0.7–4.0)
MCH: 31.1 pg (ref 26.0–34.0)
MCHC: 33.4 g/dL (ref 30.0–36.0)
MCV: 93 fL (ref 80.0–100.0)
Monocytes Absolute: 0.4 10*3/uL (ref 0.1–1.0)
Monocytes Relative: 3 %
Neutro Abs: 9.4 10*3/uL — ABNORMAL HIGH (ref 1.7–7.7)
Neutrophils Relative %: 72 %
Platelets: 307 10*3/uL (ref 150–400)
RBC: 4.44 MIL/uL (ref 3.87–5.11)
RDW: 14.6 % (ref 11.5–15.5)
WBC: 13.4 10*3/uL — ABNORMAL HIGH (ref 4.0–10.5)
nRBC: 0 % (ref 0.0–0.2)

## 2018-07-24 LAB — COMPREHENSIVE METABOLIC PANEL
ALT: 13 U/L (ref 0–44)
AST: 13 U/L — ABNORMAL LOW (ref 15–41)
Albumin: 3.6 g/dL (ref 3.5–5.0)
Alkaline Phosphatase: 87 U/L (ref 38–126)
Anion gap: 10 (ref 5–15)
BUN: 51 mg/dL — ABNORMAL HIGH (ref 6–20)
CO2: 20 mmol/L — ABNORMAL LOW (ref 22–32)
Calcium: 8.8 mg/dL — ABNORMAL LOW (ref 8.9–10.3)
Chloride: 104 mmol/L (ref 98–111)
Creatinine, Ser: 1.56 mg/dL — ABNORMAL HIGH (ref 0.44–1.00)
GFR calc Af Amer: 43 mL/min — ABNORMAL LOW (ref >60)
GFR calc non Af Amer: 37 mL/min — ABNORMAL LOW (ref >60)
Glucose, Bld: 326 mg/dL — ABNORMAL HIGH (ref 70–99)
Potassium: 4.3 mmol/L (ref 3.5–5.1)
Sodium: 134 mmol/L — ABNORMAL LOW (ref 135–145)
Total Bilirubin: 0.7 mg/dL (ref 0.3–1.2)
Total Protein: 6.8 g/dL (ref 6.5–8.1)

## 2018-07-24 LAB — TROPONIN I: Troponin I: <0.03 ng/mL (ref <0.03)

## 2018-07-24 LAB — INFLUENZA PANEL BY PCR (TYPE A & B)
Influenza A By PCR: NEGATIVE
Influenza B By PCR: NEGATIVE

## 2018-07-24 LAB — GLUCOSE, CAPILLARY: Glucose-Capillary: 346 mg/dL — ABNORMAL HIGH (ref 70–99)

## 2018-07-24 MED ORDER — ACETAMINOPHEN 650 MG RE SUPP: 650.0000 mg | Freq: Four times a day (QID) | RECTAL | Status: DC | PRN

## 2018-07-24 MED ORDER — CLOPIDOGREL BISULFATE 75 MG PO TABS
75.0000 mg | ORAL_TABLET | Freq: Every day | ORAL | Status: DC
  Administered 2018-07-25: 75 mg via ORAL
  Filled 2018-07-24: qty 1

## 2018-07-24 MED ORDER — SODIUM CHLORIDE 0.9% FLUSH: 3.0000 mL | INTRAVENOUS | Status: DC | PRN

## 2018-07-24 MED ORDER — SODIUM CHLORIDE 0.9 % IV BOLUS
500.0000 mL | Freq: Once | INTRAVENOUS | Status: AC
  Administered 2018-07-24: 17:00:00 500 mL via INTRAVENOUS

## 2018-07-24 MED ORDER — ATORVASTATIN CALCIUM 80 MG PO TABS
80.0000 mg | ORAL_TABLET | Freq: Every day | ORAL | Status: DC
  Administered 2018-07-24: 80 mg via ORAL
  Filled 2018-07-24 (×2): qty 1

## 2018-07-24 MED ORDER — ONDANSETRON HCL 4 MG PO TABS: 4.0000 mg | ORAL_TABLET | Freq: Four times a day (QID) | ORAL | Status: DC | PRN

## 2018-07-24 MED ORDER — IPRATROPIUM-ALBUTEROL 0.5-2.5 (3) MG/3ML IN SOLN: 3.0000 mL | Freq: Four times a day (QID) | RESPIRATORY_TRACT | Status: DC

## 2018-07-24 MED ORDER — ONDANSETRON HCL 4 MG/2ML IJ SOLN: 4.0000 mg | Freq: Four times a day (QID) | INTRAMUSCULAR | Status: DC | PRN

## 2018-07-24 MED ORDER — RANOLAZINE ER 500 MG PO TB12
500.0000 mg | ORAL_TABLET | Freq: Two times a day (BID) | ORAL | Status: DC
  Administered 2018-07-24 – 2018-07-25 (×2): 500 mg via ORAL
  Filled 2018-07-24 (×3): qty 1

## 2018-07-24 MED ORDER — METOPROLOL SUCCINATE ER 25 MG PO TB24
50.0000 mg | ORAL_TABLET | Freq: Every day | ORAL | Status: DC
  Administered 2018-07-25: 11:00:00 50 mg via ORAL
  Filled 2018-07-24: qty 2

## 2018-07-24 MED ORDER — OXYCODONE HCL 5 MG PO TABS
5.0000 mg | ORAL_TABLET | Freq: Four times a day (QID) | ORAL | Status: DC | PRN
  Administered 2018-07-24 – 2018-07-25 (×2): 5 mg via ORAL
  Filled 2018-07-24 (×2): qty 1

## 2018-07-24 MED ORDER — SENNOSIDES-DOCUSATE SODIUM 8.6-50 MG PO TABS: 1.0000 | ORAL_TABLET | Freq: Every evening | ORAL | Status: DC | PRN

## 2018-07-24 MED ORDER — CYCLOBENZAPRINE HCL 10 MG PO TABS
10.0000 mg | ORAL_TABLET | Freq: Three times a day (TID) | ORAL | Status: DC | PRN
  Administered 2018-07-24 – 2018-07-25 (×2): 10 mg via ORAL
  Filled 2018-07-24 (×2): qty 1

## 2018-07-24 MED ORDER — ACETAMINOPHEN 325 MG PO TABS: 650.0000 mg | ORAL_TABLET | Freq: Four times a day (QID) | ORAL | Status: DC | PRN

## 2018-07-24 MED ORDER — INSULIN ASPART 100 UNIT/ML ~~LOC~~ SOLN
0.0000 [IU] | Freq: Three times a day (TID) | SUBCUTANEOUS | Status: DC
  Administered 2018-07-24: 7 [IU] via SUBCUTANEOUS
  Administered 2018-07-25: 9 [IU] via SUBCUTANEOUS
  Filled 2018-07-24 (×3): qty 1

## 2018-07-24 MED ORDER — PANTOPRAZOLE SODIUM 40 MG PO TBEC
40.0000 mg | DELAYED_RELEASE_TABLET | Freq: Every day | ORAL | Status: DC
  Administered 2018-07-25: 40 mg via ORAL
  Filled 2018-07-24: qty 1

## 2018-07-24 MED ORDER — SODIUM CHLORIDE 0.9 % IV SOLN: 250.0000 mL | INTRAVENOUS | Status: DC | PRN

## 2018-07-24 MED ORDER — ASPIRIN EC 81 MG PO TBEC
81.0000 mg | DELAYED_RELEASE_TABLET | Freq: Every day | ORAL | Status: DC
  Administered 2018-07-25: 81 mg via ORAL
  Filled 2018-07-24: qty 1

## 2018-07-24 MED ORDER — SODIUM CHLORIDE 0.9 % IV SOLN
500.0000 mg | Freq: Every day | INTRAVENOUS | Status: DC
  Administered 2018-07-24: 19:00:00 500 mg via INTRAVENOUS
  Filled 2018-07-24 (×2): qty 500

## 2018-07-24 MED ORDER — GABAPENTIN 600 MG PO TABS
900.0000 mg | ORAL_TABLET | Freq: Four times a day (QID) | ORAL | Status: DC
  Administered 2018-07-24 – 2018-07-25 (×2): 900 mg via ORAL
  Filled 2018-07-24 (×2): qty 2

## 2018-07-24 MED ORDER — SODIUM CHLORIDE 0.9 % IV SOLN
1.0000 g | Freq: Every day | INTRAVENOUS | Status: DC
  Administered 2018-07-24: 18:00:00 1 g via INTRAVENOUS
  Filled 2018-07-24 (×2): qty 10

## 2018-07-24 MED ORDER — POLYETHYLENE GLYCOL 3350 17 G PO PACK: 17.0000 g | PACK | Freq: Every day | ORAL | Status: DC | PRN

## 2018-07-24 MED ORDER — HEPARIN SODIUM (PORCINE) 5000 UNIT/ML IJ SOLN
5000.0000 [IU] | Freq: Three times a day (TID) | INTRAMUSCULAR | Status: DC
  Administered 2018-07-24 – 2018-07-25 (×2): 5000 [IU] via SUBCUTANEOUS
  Filled 2018-07-24 (×2): qty 1

## 2018-07-24 MED ORDER — NICOTINE 14 MG/24HR TD PT24
14.0000 mg | MEDICATED_PATCH | Freq: Every day | TRANSDERMAL | Status: DC
  Administered 2018-07-24: 22:00:00 14 mg via TRANSDERMAL
  Filled 2018-07-24: qty 1

## 2018-07-24 MED ORDER — METHYLPREDNISOLONE SODIUM SUCC 125 MG IJ SOLR
125.0000 mg | Freq: Once | INTRAMUSCULAR | Status: AC
  Administered 2018-07-24: 17:00:00 125 mg via INTRAVENOUS
  Filled 2018-07-24: qty 2

## 2018-07-24 MED ORDER — METHYLPREDNISOLONE SODIUM SUCC 125 MG IJ SOLR
60.0000 mg | Freq: Four times a day (QID) | INTRAMUSCULAR | Status: DC
  Administered 2018-07-25: 09:00:00 60 mg via INTRAVENOUS
  Filled 2018-07-24: qty 2

## 2018-07-24 MED ORDER — SODIUM CHLORIDE 0.9% FLUSH
3.0000 mL | Freq: Two times a day (BID) | INTRAVENOUS | Status: DC
  Administered 2018-07-24: 23:00:00 3 mL via INTRAVENOUS

## 2018-07-24 MED ORDER — IPRATROPIUM-ALBUTEROL 20-100 MCG/ACT IN AERS
1.0000 | INHALATION_SPRAY | Freq: Four times a day (QID) | RESPIRATORY_TRACT | Status: DC
  Administered 2018-07-24 – 2018-07-25 (×3): 1 via RESPIRATORY_TRACT
  Filled 2018-07-24: qty 4

## 2018-07-24 NOTE — H&P (Signed)
[facility] NAME: Jennifer [name]    MR#:  [id number]  DATE OF BIRTH:  Oct 13, 1960  DATE OF ADMISSION:  07/24/2018  PRIMARY CARE PHYSICIAN: Jeronimo Greaves, MD   REQUESTING/REFERRING PHYSICIAN:   CHIEF COMPLAINT:   Chief Complaint  Patient presents with  . Cough  . Chest Pain    HISTORY OF PRESENT ILLNESS: Jennifer [name]  is a 58 y.o. female with a known history of arthritis, coronary disease, bipolar disorder, chronic bronchitis, tobacco abuse, hypertension, pneumonia in the past, sleep apnea presented to the emergency room for cough productive of phlegm as well as shortness of breath.  Patient also has some fever.  She was never diagnosed with COPD but has longstanding history of tobacco abuse and due for testing.  Has bronchitis and was treated with oral antibiotics Augmentin as outpatient.  She still has cough productive of phlegm.  No history of recent travel, sick contacts at home.  No international visitors.  Patient was evaluated in the emergency room given steroids and nebulization therapy.  She was also being tested for flu and coronavirus COVID-19 test.  Patient put on droplet and contact precautions.  Had an episode of low blood pressure in the emergency room.  Patient is on diuretics at home.  PAST MEDICAL HISTORY:   Past Medical History:  Diagnosis Date  . Anxiety   . Arthritis    "qwhere" (06/14/2014)  . Bipolar disorder ([place])   . CAD (coronary artery disease)    a. 2008 PCI/DES to RCA; b. 2012 PCI: 95% mRCA s/p PCI/DES; b. 2/16 PCI DES-> 90% dRCA; c. 11/16 PCI: dRCA ISR s/p PCI/DES;  d. 4/18 PCI: p-mRCA 95% s/p PCI/DES; e. 02/2017 Cath: LML nl, LAD nl, D1 40, LCX min irregs, OM1 60, RCA 50-60p/m ISR, 6md, patent dRCA stent, RPDA 90ost (fills via L->R collats), EF 55-65%.  . Chronic bronchitis ([facility]    "get it q yr"  . Daily headache    "when my blood pressure is up" (06/14/2014)  . Depression   . Diastolic  dysfunction    a. 02/2017 Echo: EF 55-60%, no rwma, Gr1 DD, nl RV fxn.  .Marland KitchenGERD (gastroesophageal reflux disease)   . Heart murmur   . High cholesterol   . History of stomach ulcers   . Hypertension   . Insomnia   . Kidney stones   . Migraine    "haven't had them in a good while; did get them 1-2 times/yr" (06/14/2014)  . [place]ocardial infarction ([facility] 2008; ~ 2012  . Pneumonia    "get it 1-2 times/year" (06/14/2014)  . RLS (restless legs syndrome)   . Seizures ([facility]   . Sleep apnea    "they want me to do the lab but I haven't" (06/14/2014)  . Stroke ([facility]    "they say I've had several" (06/14/2014)  . Tobacco abuse   . Type II diabetes mellitus ([place]   . Urinary, incontinence, stress female     PAST SURGICAL HISTORY:  Past Surgical History:  Procedure Laterality Date  . ABDOMINAL HYSTERECTOMY  1984   "I've got 1 ovary left"  . CARDIAC CATHETERIZATION N/A 02/26/2015   Procedure: Left Heart Cath and Coronary Angiography;  Surgeon: MWellington Hampshire MD;  [place];  Service: Cardiovascular;  Laterality: N/A;  . CARDIAC CATHETERIZATION N/A 02/26/2015   Procedure: Coronary Stent Intervention;  Surgeon: MWellington Hampshire MD;  [place] [facility];  Service: Cardiovascular;  Laterality: N/A;  . CESAREAN SECTION  1984  . CORONARY ANGIOPLASTY WITH STENT PLACEMENT  2008; ~ 2012   "1; 1"  . CORONARY BALLOON ANGIOPLASTY N/A 06/09/2017   Procedure: CORONARY BALLOON ANGIOPLASTY;  Surgeon: Nelva Bush, MD;  Location: [place] [facility] LAB;  Service: Cardiovascular;  Laterality: N/A;  . CORONARY STENT INTERVENTION N/A 08/04/2016   Procedure: Coronary Stent Intervention;  Surgeon: Wellington Hampshire, MD;  Location: [place] [facility] LAB;  Service: Cardiovascular;  Laterality: N/A;  . DILATION AND CURETTAGE OF UTERUS    . EXTRACORPOREAL SHOCK WAVE LITHOTRIPSY  X 2  . I&D EXTREMITY Left 02/09/2018   Procedure: IRRIGATION AND DEBRIDEMENT EXTREMITY;  Surgeon: Herbert Pun, MD;  Location: [facility] ORS;  Service: General;  Laterality: Left;  . KNEE ARTHROSCOPY Left   . LEFT HEART CATH Bilateral 08/04/2016   Procedure: Left Heart Cath poss PCI;  Surgeon: Wellington Hampshire, MD;  Location: [place] [facility] LAB;  Service: Cardiovascular;  Laterality: Bilateral;  . LEFT HEART CATH AND CORONARY ANGIOGRAPHY Left 02/19/2017   Procedure: LEFT HEART CATH AND CORONARY ANGIOGRAPHY;  Surgeon: Minna Merritts, MD;  Location: [place] [facility] LAB;  Service: Cardiovascular;  Laterality: Left;  . LEFT HEART CATH AND CORONARY ANGIOGRAPHY Left 06/09/2017   Procedure: LEFT HEART CATH AND CORONARY ANGIOGRAPHY;  Surgeon: Nelva Bush, MD;  Location: [place] [facility];  Service: Cardiovascular;  Laterality: Left;  . LEFT HEART CATHETERIZATION WITH CORONARY ANGIOGRAM N/A 06/15/2014   Procedure: LEFT HEART CATHETERIZATION WITH CORONARY ANGIOGRAM;  Surgeon: Jettie Booze, MD;  Location[facility] CATH LAB;  Service: Cardiovascular;  Laterality: N/A;  . PERCUTANEOUS CORONARY STENT INTERVENTION (PCI-S)  06/15/2014   Procedure: PERCUTANEOUS CORONARY STENT INTERVENTION (PCI-S);  Surgeon: Jettie Booze, MD;  Location[facility] LAB;  Service: Cardiovascular;;  . TONSILLECTOMY    . TUBAL LIGATION      SOCIAL HISTORY:  Social History   Tobacco Use  . Smoking status: Current Every Day Smoker    Packs/day: 0.25    Years: 35.00    Pack years: 8.75    Types: Cigarettes  . Smokeless tobacco: Never Used  . Tobacco comment: Patches  Substance Use Topics  . Alcohol use: Yes    Alcohol/week: 0.0 standard drinks    Comment: occ    FAMILY HISTORY:  Family History  Problem Relation Age of Onset  . Lung cancer Mother   . Cirrhosis Father     DRUG ALLERGIES:  Allergies  Allergen Reactions  . Carbamazepine Anaphylaxis and Other (See Comments)    Blood dyscrasia when on with Lithium  . Lithium Other (See Comments)    Blood dyscrasia  . Lodine [Etodolac] Shortness Of Breath,  Diarrhea, Nausea And Vomiting and Rash  . Codeine Other (See Comments)    Upset stomach  . Levofloxacin In D5w Swelling and Other (See Comments)    "Salty" sensation in mouth. "Hard time to explain it"  . Methocarbamol Other (See Comments)    Unknown  . Tape Other (See Comments)    Welps, blister on skin  . Relafen [Nabumetone] Diarrhea, Nausea And Vomiting and Rash    REVIEW OF SYSTEMS:   CONSTITUTIONAL: Had fever, fatigue and weakness.  EYES: No blurred or double vision.  EARS, NOSE, AND THROAT: No tinnitus or ear pain.  RESPIRATORY: Has cough, shortness of breath, wheezing  no hemoptysis.  CARDIOVASCULAR: No chest pain, orthopnea, edema.  GASTROINTESTINAL: No nausea, vomiting, diarrhea or abdominal pain.  GENITOURINARY:  No dysuria, hematuria.  ENDOCRINE: No polyuria, nocturia,  HEMATOLOGY: No anemia, easy bruising or bleeding SKIN: No rash or lesion. MUSCULOSKELETAL: No joint pain or arthritis.   NEUROLOGIC: No tingling, numbness, weakness.  PSYCHIATRY: No anxiety or depression.   MEDICATIONS AT HOME:  Prior to Admission medications   Medication Sig Start Date End Date Taking? Authorizing Provider  acetaminophen (TYLENOL) 325 MG tablet Take 2 tablets (650 mg total) by mouth every 6 (six) hours as needed for mild pain (or Fever >/= 101). 02/10/18   Nicholes Mango, MD  aspirin EC 81 MG EC tablet Take 1 tablet (81 mg total) by mouth daily. 02/27/15   Gladstone Lighter, MD  atorvastatin (LIPITOR) 80 MG tablet Take 80 mg by mouth at bedtime.     [provider]  Blood Glucose Monitoring Suppl W/DEVICE KIT Test 1-2 times daily; E11.65 diagnosis 03/02/15   Rubbie Battiest, RN  clopidogrel (PLAVIX) 75 MG tablet Take 75 mg by mouth once daily with breakfast 09/07/17   Minna Merritts, MD  cyclobenzaprine (FLEXERIL) 10 MG tablet Take 10 mg by mouth 3 (three) times daily as needed for muscle spasms.  07/17/16   [provider]  furosemide (LASIX) 40 MG tablet Take 40 mg  by mouth daily. 12/20/17   [provider]  gabapentin (NEURONTIN) 600 MG tablet Take 900 mg by mouth 4 (four) times daily.  02/27/17   [provider]  liraglutide (VICTOZA) 18 MG/3ML SOPN Inject 1.8 mg into the skin daily.  02/20/17   [provider]  metoprolol succinate (TOPROL-XL) 50 MG 24 hr tablet Take 1 tablet (50 mg total) by mouth daily. Take with or immediately following a meal. 07/20/18   Gollan, Kathlene November, MD  nicotine (NICODERM CQ - DOSED IN MG/24 HOURS) 14 mg/24hr patch Place 1 patch (14 mg total) onto the skin daily. 02/11/18   Gouru, Illene Silver, MD  oxyCODONE (OXY IR/ROXICODONE) 5 MG immediate release tablet Take 1 tablet (5 mg total) by mouth every 6 (six) hours as needed for moderate pain or severe pain. 02/10/18   Gouru, Illene Silver, MD  pantoprazole (PROTONIX) 40 MG tablet TAKE 1 TABLET(40 MG) BY MOUTH DAILY Patient taking differently: Take 40 mg by mouth daily.  12/03/17   Dunn, Areta Haber, PA-C  polyethylene glycol (MIRALAX / Floria Raveling) packet Take 17 g by mouth daily as needed for mild constipation.    [provider]  RANEXA 1000 MG SR tablet TAKE 1 TABLET BY MOUTH TWICE DAILY( EVERY TWELVE HOURS) 07/20/18   Gollan, Kathlene November, MD  TRESIBA FLEXTOUCH 200 UNIT/ML SOPN Inject 66 Units into the skin daily. 02/01/18   [provider]      PHYSICAL EXAMINATION:   VITAL SIGNS: Blood pressure 101/67, pulse 75, temperature 98.9 F (37.2 C), temperature source Oral, resp. rate 16, height _0  (1.575 m), weight 93 kg, SpO2 95 %.  GENERAL:  58 y.o.-year-old patient lying in the bed with no acute distress.  EYES: Pupils equal, round, reactive to light and accommodation. No scleral icterus. Extraocular muscles intact.  HEENT: Head atraumatic, normocephalic. Oropharynx and nasopharynx clear.  NECK:  Supple, no jugular venous distention. No thyroid enlargement, no tenderness.  LUNGS: Decreased breath sounds bilaterally, bilateral wheezing, rales,. No use of  accessory muscles of respiration.  CARDIOVASCULAR: S1, S2 normal. No murmurs, rubs, or gallops.  ABDOMEN: Soft, nontender, nondistended. Bowel sounds present. No organomegaly or mass.  EXTREMITIES: No pedal edema, cyanosis, or clubbing.  NEUROLOGIC: Cranial nerves II through  XII are intact. Muscle strength 5/5 in all extremities. Sensation intact. Gait not checked.  PSYCHIATRIC: The patient is alert and oriented x 3.  SKIN: No obvious rash, lesion, or ulcer.   LABORATORY PANEL:   CBC Recent Labs  Lab 07/24/18 1701  WBC 13.4*  HGB 13.8  HCT 41.3  PLT 307  MCV 93.0  MCH 31.1  MCHC 33.4  RDW 14.6  LYMPHSABS 3.1  MONOABS 0.4  EOSABS 0.3  BASOSABS 0.0   ------------------------------------------------------------------------------------------------------------------  Chemistries  Recent Labs  Lab 07/24/18 1701  NA 134*  K 4.3  CL 104  CO2 20*  GLUCOSE 326*  BUN 51*  CREATININE 1.56*  CALCIUM 8.8*  AST 13*  ALT 13  ALKPHOS 87  BILITOT 0.7   ------------------------------------------------------------------------------------------------------------------ estimated creatinine clearance is 43.3 mL/min (A) (by C-G formula based on SCr of 1.56 mg/dL (H)). ------------------------------------------------------------------------------------------------------------------ No results for input(s): TSH, T4TOTAL, T3FREE, THYROIDAB in the last 72 hours.  Invalid input(s): FREET3   Coagulation profile No results for input(s): INR, PROTIME in the last 168 hours. ------------------------------------------------------------------------------------------------------------------- No results for input(s): DDIMER in the last 72 hours. -------------------------------------------------------------------------------------------------------------------  Cardiac Enzymes Recent Labs  Lab 07/24/18 1701  TROPONINI <0.03    ------------------------------------------------------------------------------------------------------------------ Invalid input(s): POCBNP  ---------------------------------------------------------------------------------------------------------------  Urinalysis    Component Value Date/Time   COLORURINE YELLOW (A) 02/06/2018 2255   APPEARANCEUR CLEAR (A) 02/06/2018 2255   APPEARANCEUR Hazy 01/30/2014 2236   LABSPEC 1.024 02/06/2018 2255   LABSPEC 1.019 01/30/2014 2236   PHURINE 5.0 02/06/2018 2255   GLUCOSEU NEGATIVE 02/06/2018 2255   GLUCOSEU Negative 01/30/2014 2236   HGBUR NEGATIVE 02/06/2018 2255   BILIRUBINUR NEGATIVE 02/06/2018 2255   BILIRUBINUR Negative 01/30/2014 2236   KETONESUR NEGATIVE 02/06/2018 2255   PROTEINUR NEGATIVE 02/06/2018 2255   NITRITE NEGATIVE 02/06/2018 2255   LEUKOCYTESUR NEGATIVE 02/06/2018 2255   LEUKOCYTESUR 2+ 01/30/2014 2236     RADIOLOGY: Dg Chest Portable 1 View  Result Date: 07/24/2018 CLINICAL DATA:  58 year old female with cough and shortness of breath. EXAM: PORTABLE CHEST 1 VIEW COMPARISON:  Chest radiograph dated 02/09/2017 FINDINGS: There is mild emphysema. Bibasilar interstitial prominence, likely atelectatic changes. Developing infiltrate is less likely. No large pleural effusion. No pneumothorax. The cardiac silhouette is within normal limits. No acute osseous pathology. Degenerative changes of the spine and right shoulder. IMPRESSION: Bibasilar interstitial prominence, likely atelectatic changes. Developing infiltrate is less likely. Electronically Signed   By: Anner Crete M.D.   On: 07/24/2018 17:04    EKG: Orders placed or performed during the hospital encounter of 07/24/18  . ED EKG  . ED EKG  . ED EKG  . ED EKG    IMPRESSION AND PLAN: 58 year old female patient with a known history of arthritis, coronary disease, bipolar disorder, chronic bronchitis, tobacco abuse, hypertension, pneumonia in the past, sleep apnea  presented to the emergency room for cough productive of phlegm as well as shortness of breath.    -New onset COPD with exacerbation Start patient on IV steroids and aggressive nebulization therapy Oxygen via nasal cannula as needed Check for flu test and COVID-19 test Droplet and contact precautions  -Acute on chronic bronchitis Start patient on IV Rocephin and Zithromax antibiotic  -Tobacco abuse Tobacco cessation counseled to the patient for 6 minutes Nicotine patch offered  -Hypotension Hold diuretics IV fluids  -Coronary artery disease Continue aspirin, statin, Ranexa Continue beta-blocker if blood pressure permits  All the records are reviewed and case discussed with ED provider. Management plans discussed with the  patient, family and they are in agreement.  CODE STATUS:Full code Code Status History    Date Active Date Inactive Code Status Order ID Comments User Context   02/07/2018 0047 02/10/2018 2123 Full Code [id number]  Lance Coon, MD Inpatient   06/09/2017 1529 06/10/2017 1608 Full Code [id number]  Nelva Bush, MD Inpatient   02/19/2017 1204 02/19/2017 1651 Full Code [id number]  Minna Merritts, MD Inpatient   08/02/2016 0753 08/05/2016 1628 Full Code [id number]  Saundra Shelling, MD Inpatient   02/26/2015 1350 02/27/2015 1651 Full Code [id number]  Wellington Hampshire, MD Inpatient   02/23/2015 0819 02/26/2015 1350 Full Code [id number]  Harrie Foreman, MD Inpatient   06/15/2014 1229 06/16/2014 1754 Full Code [id number]  Jettie Booze, MD Inpatient   06/14/2014 0847 06/15/2014 1229 Full Code [id number]  Karen Kitchens Inpatient   06/14/2014 0725 06/14/2014 0847 Full Code [id number]  Louellen Molder, MD Inpatient       TOTAL TIME TAKING CARE OF THIS PATIENT: 54 minutes.    Saundra Shelling M.D on 07/24/2018 at 6:58 PM  Between 7am to 6pm - Pager - [phone]  After 6pm go to [email or web address] - password [email or web address] [facility]  Office   [phone]  CC: Primary care physician; Jeronimo Greaves, MD

## 2018-07-24 NOTE — ED Notes (Signed)
Meal tray given with drink. Pt reports she took her insulin this morning but has not taken her night medications. Patient agrees to check glucose after dinner for dosing insulin.

## 2018-07-24 NOTE — ED Notes (Signed)
ED TO INPATIENT HANDOFF REPORT  ED Nurse Name and Phone #: Victorino Dike [phone]  S Name/Age/Gender Jennifer [name] 58 y.o. female Room/Bed: ED37A/ED37A  Code Status   Code Status: Prior  Home/SNF/Other Home Patient oriented to: self, place, time and situation Is this baseline? Yes   Triage Complete: Triage complete  Chief Complaint SOB  Triage Note Cough and SOB x 1 month. States has had fevers and has loss of taste.    Allergies Allergies  Allergen Reactions  . Carbamazepine Anaphylaxis and Other (See Comments)    Blood dyscrasia when on with Lithium  . Lithium Other (See Comments)    Blood dyscrasia  . Lodine [Etodolac] Shortness Of Breath, Diarrhea, Nausea And Vomiting and Rash  . Codeine Other (See Comments)    Upset stomach  . Levofloxacin In D5w Swelling and Other (See Comments)    "Salty" sensation in mouth. "Hard time to explain it"  . Methocarbamol Other (See Comments)    Unknown  . Tape Other (See Comments)    Welps, blister on skin  . Relafen [Nabumetone] Diarrhea, Nausea And Vomiting and Rash    Level of Care/Admitting Diagnosis ED Disposition    ED Disposition Condition Comment   Admit  Hospital Area: [facility] [[id number]]  Level of Care: Med-Surg [16]  Diagnosis: COPD exacerbation [facility]) [[id number]]  Admitting Physician: Ihor Austin [[id number]]  Attending Physician: Ihor Austin [[id number]]  Estimated length of stay: past midnight tomorrow  Certification:: I certify this patient will need inpatient services for at least 2 midnights  Bed request comments: rule out covid 19, Low risk  PT Class (Do Not Modify): Inpatient [101]  PT Acc Code (Do Not Modify): Private [1]       B Medical/Surgery History Past Medical History:  Diagnosis Date  . Anxiety   . Arthritis    "qwhere" (06/14/2014)  . Bipolar disorder (HCC)   . CAD (coronary artery disease)    a. 2008 PCI/DES to RCA; b. 2012 PCI: 95% mRCA s/p PCI/DES; b. 2/16 PCI DES-> 90%  dRCA; c. 11/16 PCI: dRCA ISR s/p PCI/DES;  d. 4/18 PCI: p-mRCA 95% s/p PCI/DES; e. 02/2017 Cath: LML nl, LAD nl, D1 40, LCX min irregs, OM1 60, RCA 50-60p/m ISR, 30m/d, patent dRCA stent, RPDA 90ost (fills via L->R collats), EF 55-65%.  . Chronic bronchitis (HCC)    "get it q yr"  . Daily headache    "when my blood pressure is up" (06/14/2014)  . Depression   . Diastolic dysfunction    a. 02/2017 Echo: EF 55-60%, no rwma, Gr1 DD, nl RV fxn.  [name] GERD (gastroesophageal reflux disease)   . Heart murmur   . High cholesterol   . History of stomach ulcers   . Hypertension   . Insomnia   . Kidney stones   . Migraine    "haven't had them in a good while; did get them 1-2 times/yr" (06/14/2014)  . Myocardial infarction (HCC) 2008; ~ 2012  . Pneumonia    "get it 1-2 times/year" (06/14/2014)  . RLS (restless legs syndrome)   . Seizures (HCC)   . Sleep apnea    "they want me to do the lab but I haven't" (06/14/2014)  . Stroke [facility])    "they say I've had several" (06/14/2014)  . Tobacco abuse   . Type II diabetes mellitus (HCC)   . Urinary, incontinence, stress female    Past Surgical History:  Procedure Laterality Date  . ABDOMINAL HYSTERECTOMY  1984   "  I've got 1 ovary left"  . CARDIAC CATHETERIZATION N/A 02/26/2015   Procedure: Left Heart Cath and Coronary Angiography;  Surgeon: Iran Ouch, MD;  Location: [facility] INVASIVE CV LAB;  Service: Cardiovascular;  Laterality: N/A;  . CARDIAC CATHETERIZATION N/A 02/26/2015   Procedure: Coronary Stent Intervention;  Surgeon: Iran Ouch, MD;  Location: [facility] INVASIVE CV LAB;  Service: Cardiovascular;  Laterality: N/A;  . CESAREAN SECTION  1984  . CORONARY ANGIOPLASTY WITH STENT PLACEMENT  2008; ~ 2012   "1; 1"  . CORONARY BALLOON ANGIOPLASTY N/A 06/09/2017   Procedure: CORONARY BALLOON ANGIOPLASTY;  Surgeon: Yvonne Kendall, MD;  Location: [facility] INVASIVE CV LAB;  Service: Cardiovascular;  Laterality: N/A;  . CORONARY STENT INTERVENTION N/A  08/04/2016   Procedure: Coronary Stent Intervention;  Surgeon: Iran Ouch, MD;  Location: [facility] INVASIVE CV LAB;  Service: Cardiovascular;  Laterality: N/A;  . DILATION AND CURETTAGE OF UTERUS    . EXTRACORPOREAL SHOCK WAVE LITHOTRIPSY  X 2  . I&D EXTREMITY Left 02/09/2018   Procedure: IRRIGATION AND DEBRIDEMENT EXTREMITY;  Surgeon: Carolan Shiver, MD;  Location: [facility] ORS;  Service: General;  Laterality: Left;  . KNEE ARTHROSCOPY Left   . LEFT HEART CATH Bilateral 08/04/2016   Procedure: Left Heart Cath poss PCI;  Surgeon: Iran Ouch, MD;  Location: [facility] INVASIVE CV LAB;  Service: Cardiovascular;  Laterality: Bilateral;  . LEFT HEART CATH AND CORONARY ANGIOGRAPHY Left 02/19/2017   Procedure: LEFT HEART CATH AND CORONARY ANGIOGRAPHY;  Surgeon: Antonieta Iba, MD;  Location: [facility] INVASIVE CV LAB;  Service: Cardiovascular;  Laterality: Left;  . LEFT HEART CATH AND CORONARY ANGIOGRAPHY Left 06/09/2017   Procedure: LEFT HEART CATH AND CORONARY ANGIOGRAPHY;  Surgeon: Yvonne Kendall, MD;  Location: [facility] INVASIVE CV LAB;  Service: Cardiovascular;  Laterality: Left;  . LEFT HEART CATHETERIZATION WITH CORONARY ANGIOGRAM N/A 06/15/2014   Procedure: LEFT HEART CATHETERIZATION WITH CORONARY ANGIOGRAM;  Surgeon: Corky Crafts, MD;  Location: [facility] CATH LAB;  Service: Cardiovascular;  Laterality: N/A;  . PERCUTANEOUS CORONARY STENT INTERVENTION (PCI-S)  06/15/2014   Procedure: PERCUTANEOUS CORONARY STENT INTERVENTION (PCI-S);  Surgeon: Corky Crafts, MD;  Location: [facility] CATH LAB;  Service: Cardiovascular;;  . TONSILLECTOMY    . TUBAL LIGATION       A IV Location/Drains/Wounds Patient Lines/Drains/Airways Status   Active Line/Drains/Airways    Name:   Placement date:   Placement time:   Site:   Days:   Peripheral IV 07/24/18 Right Hand   07/24/18    1659    Hand   less than 1   Incision (Closed) 08/04/16 Wrist Right   08/04/16    1257     719   Incision (Closed) 02/09/18 Leg Left    02/09/18    1302     165          Intake/Output Last 24 hours No intake or output data in the 24 hours ending 07/24/18 1821  Labs/Imaging Results for orders placed or performed during the hospital encounter of 07/24/18 (from the past 48 hour(s))  CBC with Differential/Platelet     Status: Abnormal   Collection Time: 07/24/18  5:01 PM  Result Value Ref Range   WBC 13.4 (H) 4.0 - 10.5 K/uL   RBC 4.44 3.87 - 5.11 MIL/uL   Hemoglobin 13.8 12.0 - 15.0 g/dL   HCT 06.2 37.6 - 28.3 %   MCV 93.0 80.0 - 100.0 fL   MCH 31.1 26.0 - 34.0 pg   MCHC  33.4 30.0 - 36.0 g/dL   RDW 65.7 84.6 - 96.2 %   Platelets 307 150 - 400 K/uL   nRBC 0.0 0.0 - 0.2 %   Neutrophils Relative % 72 %   Neutro Abs 9.4 (H) 1.7 - 7.7 K/uL   Lymphocytes Relative 23 %   Lymphs Abs 3.1 0.7 - 4.0 K/uL   Monocytes Relative 3 %   Monocytes Absolute 0.4 0.1 - 1.0 K/uL   Eosinophils Relative 2 %   Eosinophils Absolute 0.3 0.0 - 0.5 K/uL   Basophils Relative 0 %   Basophils Absolute 0.0 0.0 - 0.1 K/uL   Immature Granulocytes 0 %   Abs Immature Granulocytes 0.05 0.00 - 0.07 K/uL    Comment: Performed at [facility], [address] [place], [place] [address]  Comprehensive metabolic panel     Status: Abnormal   Collection Time: 07/24/18  5:01 PM  Result Value Ref Range   Sodium 134 (L) 135 - 145 mmol/L   Potassium 4.3 3.5 - 5.1 mmol/L   Chloride 104 98 - 111 mmol/L   CO2 20 (L) 22 - 32 mmol/L   Glucose, Bld 326 (H) 70 - 99 mg/dL   BUN 51 (H) 6 - 20 mg/dL   Creatinine, Ser 1.32 (H) 0.44 - 1.00 mg/dL   Calcium 8.8 (L) 8.9 - 10.3 mg/dL   Total Protein 6.8 6.5 - 8.1 g/dL   Albumin 3.6 3.5 - 5.0 g/dL   AST 13 (L) 15 - 41 U/L   ALT 13 0 - 44 U/L   Alkaline Phosphatase 87 38 - 126 U/L   Total Bilirubin 0.7 0.3 - 1.2 mg/dL   GFR calc non Af Amer 37 (L) >60 mL/min   GFR calc Af Amer 43 (L) >60 mL/min   Anion gap 10 5 - 15    Comment: Performed at [facility], [address] [place], [place]  [address]  Troponin I - ONCE - STAT     Status: None   Collection Time: 07/24/18  5:01 PM  Result Value Ref Range   Troponin I <0.03 <0.03 ng/mL    Comment: Performed at [facility], [address] [place], [place] [address]   Dg Chest Portable 1 View  Result Date: 07/24/2018 CLINICAL DATA:  58 year old female with cough and shortness of breath. EXAM: PORTABLE CHEST 1 VIEW COMPARISON:  Chest radiograph dated 02/09/2017 FINDINGS: There is mild emphysema. Bibasilar interstitial prominence, likely atelectatic changes. Developing infiltrate is less likely. No large pleural effusion. No pneumothorax. The cardiac silhouette is within normal limits. No acute osseous pathology. Degenerative changes of the spine and right shoulder. IMPRESSION: Bibasilar interstitial prominence, likely atelectatic changes. Developing infiltrate is less likely. Electronically Signed   By: Elgie Collard M.D.   On: 07/24/2018 17:04    Pending Labs Unresulted Labs (From admission, onward)    Start     Ordered   07/24/18 1743  Novel Coronavirus, NAA (hospital order; send-out to ref lab)  (Novel Coronavirus, NAA [facility] Order; send-out to ref lab) with precautions panel)  Once,   STAT    Question Answer Comment  Current symptoms Fever and Shortness of breath   Excluded other viral illnesses Yes   Exposure Risk Traveled to high risk areas in the last 14 days   Patient immune status Immunocompromised      07/24/18 1742   07/24/18 1743  Influenza panel by PCR (type A & B)  (Influenza PCR Panel)  Once,  STAT     07/24/18 1742   Signed and Held  CBC  (heparin)  Once,   R    Comments:  Baseline for heparin therapy IF NOT ALREADY DRAWN.  Notify MD if PLT < 100 K.    Signed and Held   Signed and Held  Creatinine, serum  (heparin)  Once,   R    Comments:  Baseline for heparin therapy IF NOT ALREADY DRAWN.    Signed and Held   Signed and Held  Basic metabolic panel  Tomorrow morning,   R     Signed and Held    Signed and Held  CBC  Tomorrow morning,   R     Signed and Held          Vitals/Pain Today's Vitals   07/24/18 1619 07/24/18 1620 07/24/18 1732 07/24/18 1745  BP: (!) 90/50  (!) 87/59 101/67  Pulse: 83  75   Resp: 20  16   Temp: 97.7 F (36.5 C)  98.9 F (37.2 C)   TempSrc: Oral  Oral   SpO2: 97%  95%   Weight:  93 kg    Height:  5\' 2"  (1.575 m)    PainSc:  5       Isolation Precautions Contact precautions  Medications Medications  methylPREDNISolone sodium succinate (SOLU-MEDROL) 125 mg/2 mL injection 60 mg (has no administration in time range)  cefTRIAXone (ROCEPHIN) 1 g in sodium chloride 0.9 % 100 mL IVPB (1 g Intravenous New Bag/Given 07/24/18 1820)  azithromycin (ZITHROMAX) 500 mg in sodium chloride 0.9 % 250 mL IVPB (has no administration in time range)  Ipratropium-Albuterol (COMBIVENT) respimat 1 puff (has no administration in time range)  sodium chloride 0.9 % bolus 500 mL (500 mLs Intravenous New Bag/Given 07/24/18 1706)  methylPREDNISolone sodium succinate (SOLU-MEDROL) 125 mg/2 mL injection 125 mg (125 mg Intravenous Given 07/24/18 1706)    Mobility walks Low fall risk   Focused Assessments Pulmonary Assessment Handoff:  Lung sounds:   O2 Device: Room Air        R Recommendations: See Admitting Provider Note  Report given to:   Additional Notes:

## 2018-07-24 NOTE — Progress Notes (Signed)
Advanced care plan. Purpose of the Encounter: CODE STATUS Parties in Attendance: Patient Patient's Decision Capacity: Good Subjective/Patient's story: Jennifer [name]  is a 58 y.o. female with a known history of arthritis, coronary disease, bipolar disorder, chronic bronchitis, tobacco abuse, hypertension, pneumonia in the past, sleep apnea presented to the emergency room for cough productive of phlegm as well as shortness of breath.  Patient also has some fever. Objective/Medical story Patient was evaluated in the emergency room.  Appears to be in COPD exacerbation.  Needs also evaluation for flu and COVID-19 test.  Needs nebulization therapy and steroids and antibiotics for bronchitis. Goals of care determination:  Advance care directives goals of care and treatment plan discussed Patient wants everything done which includes CPR, intubation and ventilator if the need arises. CODE STATUS: Full code Time spent discussing advanced care planning: 16 minutes

## 2018-07-24 NOTE — ED Triage Notes (Signed)
Cough and SOB x 1 month. States has had fevers and has loss of taste.

## 2018-07-24 NOTE — Progress Notes (Signed)
Talked to Dr. Anne Hahn about patient's blood sugar at 346, she received solumedrol and she ate before her sugar was checked. Patient not on insulin and CBG check, MD will place order. Also talked about her BP being soft at 106/75 has scheduled metoprolol order to hold. RN will continue to monitor.

## 2018-07-24 NOTE — ED Provider Notes (Signed)
[facility] Emergency Department Provider Note    First MD Initiated Contact with Patient 07/24/18 1627     (approximate)  I have reviewed the triage vital signs and the nursing notes.   HISTORY  Chief Complaint Cough and Chest Pain    HPI Jennifer [name] is a 58 y.o. female below listed past medical history presents the ER for over 1 month of persistent cough congestion and shortness of breath.  Patient just now completing a course of Augmentin and completed prednisone 5 days ago with recurrent shortness of breath.  Patient also feels like she has some difficulty tasting and feels that she has blisters on her tongue.  Symptoms have not been progressive but she noted them yesterday.  Denies any difficulty swallowing.  Does describe some generalized malaise and weakness.  No known sick contacts.    Past Medical History:  Diagnosis Date  . Anxiety   . Arthritis    "qwhere" (06/14/2014)  . Bipolar disorder ([place])   . CAD (coronary artery disease)    a. 2008 PCI/DES to RCA; b. 2012 PCI: 95% mRCA s/p PCI/DES; b. 2/16 PCI DES-> 90% dRCA; c. 11/16 PCI: dRCA ISR s/p PCI/DES;  d. 4/18 PCI: p-mRCA 95% s/p PCI/DES; e. 02/2017 Cath: LML nl, LAD nl, D1 40, LCX min irregs, OM1 60, RCA 50-60p/m ISR, 65md, patent dRCA stent, RPDA 90ost (fills via L->R collats), EF 55-65%.  . Chronic bronchitis ([place]    "get it q yr"  . Daily headache    "when my blood pressure is up" (06/14/2014)  . Depression   . Diastolic dysfunction    a. 02/2017 Echo: EF 55-60%, no rwma, Gr1 DD, nl RV fxn.  .Marland KitchenGERD (gastroesophageal reflux disease)   . Heart murmur   . High cholesterol   . History of stomach ulcers   . Hypertension   . Insomnia   . Kidney stones   . Migraine    "haven't had them in a good while; did get them 1-2 times/yr" (06/14/2014)  . Myocardial infarction ([place] 2008; ~ 2012  . Pneumonia    "get it 1-2 times/year" (06/14/2014)  . RLS (restless legs syndrome)   . Seizures  ([place]   . Sleep apnea    "they want me to do the lab but I haven't" (06/14/2014)  . Stroke ([facility]    "they say I've had several" (06/14/2014)  . Tobacco abuse   . Type II diabetes mellitus ([place]   . Urinary, incontinence, stress female    Family History  Problem Relation Age of Onset  . Lung cancer Mother   . Cirrhosis Father    Past Surgical History:  Procedure Laterality Date  . ABDOMINAL HYSTERECTOMY  1984   "I've got 1 ovary left"  . CARDIAC CATHETERIZATION N/A 02/26/2015   Procedure: Left Heart Cath and Coronary Angiography;  Surgeon: MWellington Hampshire MD;  [place];  Service: Cardiovascular;  Laterality: N/A;  . CARDIAC CATHETERIZATION N/A 02/26/2015   Procedure: Coronary Stent Intervention;  Surgeon: MWellington Hampshire MD;  [facility];  Service: Cardiovascular;  Laterality: N/A;  . CESAREAN SECTION  1984  . CORONARY ANGIOPLASTY WITH STENT PLACEMENT  2008; ~ 2012   "1; 1"  . CORONARY BALLOON ANGIOPLASTY N/A 06/09/2017   Procedure: CORONARY BALLOON ANGIOPLASTY;  Surgeon: ENelva Bush MD;  [facility];  Service: Cardiovascular;  Laterality: N/A;  . CORONARY STENT INTERVENTION N/A 08/04/2016   Procedure:  Coronary Stent Intervention;  Surgeon: Wellington Hampshire, MD;  Location: [place] [facility];  Service: Cardiovascular;  Laterality: N/A;  . DILATION AND CURETTAGE OF UTERUS    . EXTRACORPOREAL SHOCK WAVE LITHOTRIPSY  X 2  . I&D EXTREMITY Left 02/09/2018   Procedure: IRRIGATION AND DEBRIDEMENT EXTREMITY;  Surgeon: Herbert Pun, MD;  Location: [facility] ORS;  Service: General;  Laterality: Left;  . KNEE ARTHROSCOPY Left   . LEFT HEART CATH Bilateral 08/04/2016   Procedure: Left Heart Cath poss PCI;  Surgeon: Wellington Hampshire, MD;  Location: [place] CV LAB;  Service: Cardiovascular;  Laterality: Bilateral;  . LEFT HEART CATH AND CORONARY ANGIOGRAPHY Left 02/19/2017   Procedure: LEFT HEART CATH AND CORONARY  ANGIOGRAPHY;  Surgeon: Minna Merritts, MD;  Location: [place] [facility] LAB;  Service: Cardiovascular;  Laterality: Left;  . LEFT HEART CATH AND CORONARY ANGIOGRAPHY Left 06/09/2017   Procedure: LEFT HEART CATH AND CORONARY ANGIOGRAPHY;  Surgeon: Nelva Bush, MD;  Location: [place] [facility] LAB;  Service: Cardiovascular;  Laterality: Left;  . LEFT HEART CATHETERIZATION WITH CORONARY ANGIOGRAM N/A 06/15/2014   Procedure: LEFT HEART CATHETERIZATION WITH CORONARY ANGIOGRAM;  Surgeon: Jettie Booze, MD;  Location: [facility] CATH LAB;  Service: Cardiovascular;  Laterality: N/A;  . PERCUTANEOUS CORONARY STENT INTERVENTION (PCI-S)  06/15/2014   Procedure: PERCUTANEOUS CORONARY STENT INTERVENTION (PCI-S);  Surgeon: Jettie Booze, MD;  Location: [facility] CATH LAB;  Service: Cardiovascular;;  . TONSILLECTOMY    . TUBAL LIGATION     Patient Active Problem List   Diagnosis Date Noted  . COPD exacerbation ([place]) 07/24/2018  . Chronic obstructive pulmonary disease ([place]) 07/20/2018  . Cellulitis 02/06/2018  . Sepsis ([place]) 02/06/2018  . Abnormal stress test 06/09/2017  . Unstable angina ([place]) 08/03/2016  . Morbid obesity ([place]) 05/02/2016  . Chest pain with moderate risk for cardiac etiology 03/14/2015  . CAD (coronary artery disease)   . Hospital discharge follow-up 03/07/2015  . Poorly controlled type 2 diabetes mellitus with complication ([place]) 40/81/4481  . Hyperlipidemia 06/22/2014  . Stented coronary artery   . Chronic pain syndrome   . Chest pain at rest 06/14/2014  . Acute bronchitis 06/14/2014  . Tobacco abuse 06/14/2014  . Essential hypertension 06/14/2014  . GERD (gastroesophageal reflux disease) 06/14/2014  . Bipolar disorder () 06/14/2014  . Chest pain 06/14/2014      Prior to Admission medications   Medication Sig Start Date End Date Taking? Authorizing Provider  acetaminophen (TYLENOL) 325 MG tablet Take 2 tablets (650 mg total) by mouth every 6 (six) hours as needed for mild  pain (or Fever >/= 101). 02/10/18   Nicholes Mango, MD  aspirin EC 81 MG EC tablet Take 1 tablet (81 mg total) by mouth daily. 02/27/15   Gladstone Lighter, MD  atorvastatin (LIPITOR) 80 MG tablet Take 80 mg by mouth at bedtime.     [provider]  Blood Glucose Monitoring Suppl W/DEVICE KIT Test 1-2 times daily; E11.65 diagnosis 03/02/15   Rubbie Battiest, RN  clopidogrel (PLAVIX) 75 MG tablet Take 75 mg by mouth once daily with breakfast 09/07/17   Minna Merritts, MD  cyclobenzaprine (FLEXERIL) 10 MG tablet Take 10 mg by mouth 3 (three) times daily as needed for muscle spasms.  07/17/16   [provider]  furosemide (LASIX) 40 MG tablet Take 40 mg by mouth daily. 12/20/17   [provider]  gabapentin (NEURONTIN) 600 MG tablet Take 900 mg by mouth 4 (four) times daily.  02/27/17   [provider]  liraglutide (VICTOZA) 18 MG/3ML SOPN Inject 1.8 mg into the skin daily.  02/20/17   [provider]  metoprolol succinate (TOPROL-XL) 50 MG 24 hr tablet Take 1 tablet (50 mg total) by mouth daily. Take with or immediately following a meal. 07/20/18   Gollan, Kathlene November, MD  nicotine (NICODERM CQ - DOSED IN MG/24 HOURS) 14 mg/24hr patch Place 1 patch (14 mg total) onto the skin daily. 02/11/18   Gouru, Illene Silver, MD  oxyCODONE (OXY IR/ROXICODONE) 5 MG immediate release tablet Take 1 tablet (5 mg total) by mouth every 6 (six) hours as needed for moderate pain or severe pain. 02/10/18   Gouru, Illene Silver, MD  pantoprazole (PROTONIX) 40 MG tablet TAKE 1 TABLET(40 MG) BY MOUTH DAILY Patient taking differently: Take 40 mg by mouth daily.  12/03/17   Dunn, Areta Haber, PA-C  polyethylene glycol (MIRALAX / Floria Raveling) packet Take 17 g by mouth daily as needed for mild constipation.    [provider]  RANEXA 1000 MG SR tablet TAKE 1 TABLET BY MOUTH TWICE DAILY( EVERY TWELVE HOURS) 07/20/18   Gollan, Kathlene November, MD  TRESIBA FLEXTOUCH 200 UNIT/ML SOPN Inject 66 Units into the skin daily.  02/01/18   [provider]    Allergies Carbamazepine; Lithium; Lodine [etodolac]; Codeine; Levofloxacin in d5w; Methocarbamol; Tape; and Relafen [nabumetone]    Social History Social History   Tobacco Use  . Smoking status: Current Every Day Smoker    Packs/day: 0.25    Years: 35.00    Pack years: 8.75    Types: Cigarettes  . Smokeless tobacco: Never Used  . Tobacco comment: Patches  Substance Use Topics  . Alcohol use: Yes    Alcohol/week: 0.0 standard drinks    Comment: occ  . Drug use: Yes    Types: Marijuana    Review of Systems Patient denies headaches, rhinorrhea, blurry vision, numbness, shortness of breath, chest pain, edema, cough, abdominal pain, nausea, vomiting, diarrhea, dysuria, fevers, rashes or hallucinations unless otherwise stated above in HPI. ____________________________________________   PHYSICAL EXAM:  VITAL SIGNS: Vitals:   07/24/18 1732 07/24/18 1745  BP: (!) 87/59 101/67  Pulse: 75   Resp: 16   Temp: 98.9 F (37.2 C)   SpO2: 95%     Constitutional: Alert and oriented.  Eyes: Conjunctivae are normal.  Head: Atraumatic. Nose: No congestion/rhinnorhea. Mouth/Throat: Mucous membranes are dry.  No blistering or mucosal lesions.  Uvula midline Neck: No stridor. Painless ROM.  Cardiovascular: Normal rate, regular rhythm. Grossly normal heart sounds.  Good peripheral circulation. Respiratory: Normal respiratory effort.  No retractions.  Diffuse expiratory wheezing. Gastrointestinal: Soft and nontender. No distention. No abdominal bruits. No CVA tenderness. Genitourinary:  Musculoskeletal: No lower extremity tenderness nor edema.  No joint effusions. Neurologic:  Normal speech and language. No gross focal neurologic deficits are appreciated. No facial droop Skin:  Skin is warm, dry and intact. No rash noted. Psychiatric: Mood and affect are normal. Speech and behavior are normal.  ____________________________________________    LABS (all labs ordered are listed, but only abnormal results are displayed)  Results for orders placed or performed during the hospital encounter of 07/24/18 (from the past 24 hour(s))  CBC with Differential/Platelet     Status: Abnormal   Collection Time: 07/24/18  5:01 PM  Result Value Ref Range   WBC 13.4 (H) 4.0 - 10.5 K/uL   RBC 4.44 3.87 - 5.11 MIL/uL   Hemoglobin 13.8 12.0 - 15.0 g/dL  HCT 41.3 36.0 - 46.0 %   MCV 93.0 80.0 - 100.0 fL   MCH 31.1 26.0 - 34.0 pg   MCHC 33.4 30.0 - 36.0 g/dL   RDW 14.6 11.5 - 15.5 %   Platelets 307 150 - 400 K/uL   nRBC 0.0 0.0 - 0.2 %   Neutrophils Relative % 72 %   Neutro Abs 9.4 (H) 1.7 - 7.7 K/uL   Lymphocytes Relative 23 %   Lymphs Abs 3.1 0.7 - 4.0 K/uL   Monocytes Relative 3 %   Monocytes Absolute 0.4 0.1 - 1.0 K/uL   Eosinophils Relative 2 %   Eosinophils Absolute 0.3 0.0 - 0.5 K/uL   Basophils Relative 0 %   Basophils Absolute 0.0 0.0 - 0.1 K/uL   Immature Granulocytes 0 %   Abs Immature Granulocytes 0.05 0.00 - 0.07 K/uL  Comprehensive metabolic panel     Status: Abnormal   Collection Time: 07/24/18  5:01 PM  Result Value Ref Range   Sodium 134 (L) 135 - 145 mmol/L   Potassium 4.3 3.5 - 5.1 mmol/L   Chloride 104 98 - 111 mmol/L   CO2 20 (L) 22 - 32 mmol/L   Glucose, Bld 326 (H) 70 - 99 mg/dL   BUN 51 (H) 6 - 20 mg/dL   Creatinine, Ser 1.56 (H) 0.44 - 1.00 mg/dL   Calcium 8.8 (L) 8.9 - 10.3 mg/dL   Total Protein 6.8 6.5 - 8.1 g/dL   Albumin 3.6 3.5 - 5.0 g/dL   AST 13 (L) 15 - 41 U/L   ALT 13 0 - 44 U/L   Alkaline Phosphatase 87 38 - 126 U/L   Total Bilirubin 0.7 0.3 - 1.2 mg/dL   GFR calc non Af Amer 37 (L) >60 mL/min   GFR calc Af Amer 43 (L) >60 mL/min   Anion gap 10 5 - 15  Troponin I - ONCE - STAT     Status: None   Collection Time: 07/24/18  5:01 PM  Result Value Ref Range   Troponin I <0.03 <0.03 ng/mL   ____________________________________________  EKG My review and personal interpretation at Time: 16:25    Indication: cough  Rate: 75  Rhythm: sinus Axis: normal Other: normal intervals, no stemi ____________________________________________  RADIOLOGY  I personally reviewed all radiographic images ordered to evaluate for the above acute complaints and reviewed radiology reports and findings.  These findings were personally discussed with the patient.  Please see medical record for radiology report.  ____________________________________________   PROCEDURES  Procedure(s) performed:  .Critical Care Performed by: Merlyn Lot, MD Authorized by: Merlyn Lot, MD   Critical care provider statement:    Critical care time (minutes):  30   Critical care time was exclusive of:  Separately billable procedures and treating other patients   Critical care was necessary to treat or prevent imminent or life-threatening deterioration of the following conditions:  Renal failure   Critical care was time spent personally by me on the following activities:  Development of treatment plan with patient or surrogate, discussions with consultants, evaluation of patient's response to treatment, examination of patient, obtaining history from patient or surrogate, ordering and performing treatments and interventions, ordering and review of laboratory studies, ordering and review of radiographic studies, pulse oximetry, re-evaluation of patient's condition and review of old charts      Critical Care performed: no ____________________________________________   INITIAL IMPRESSION / ASSESSMENT AND PLAN / ED COURSE  Pertinent labs & imaging results that were  available during my care of the patient were reviewed by me and considered in my medical decision making (see chart for details).   DDX: uri, copd, bronchitis, covid 19, pna, chf, medication reaction  TRAMAINE SNELL is a 58 y.o. who presents to the ED with symptoms as described above.  She is afebrile hemodynamically stable and in no respiratory  distress.  No hypoxia but does have diffuse wheezing will give albuterol as well as additional bolus of steroids.  Blood will be sent for the above differential.  Oral exam does not show any signs or symptoms of SJS or TENS or mucosal lesions.  May simply be secondary to dehydration we will give IV fluids.  The patient will be placed on continuous pulse oximetry and telemetry for monitoring.  Laboratory evaluation will be sent to evaluate for the above complaints.     Clinical Course as of Jul 23 1805  Sat Jul 24, 2018  1751 Patient with persistent hypotension requiring IV fluids with evidence of AKI.  Does have a leukocytosis with left shift but is also on steroids.  Given her persistent cough dehydration AKI and hypotension with COPD exacerbation failing outpatient management do feel patient will require hospitalization for further medical management.   [PR]    Clinical Course User Index [PR] Merlyn Lot, MD    The patient was evaluated in Emergency Department today for the symptoms described in the history of present illness. He/she was evaluated in the context of the global COVID-19 pandemic, which necessitated consideration that the patient might be at risk for infection with the SARS-CoV-2 virus that causes COVID-19. Institutional protocols and algorithms that pertain to the evaluation of patients at risk for COVID-19 are in a state of rapid change based on information released by regulatory bodies including the CDC and federal and state organizations. These policies and algorithms were followed during the patient's care in the ED.   As part of my medical decision making, I reviewed the following data within the [place] notes reviewed and incorporated, Labs reviewed, notes from prior ED visits and [place] Controlled Substance Database   ____________________________________________   FINAL CLINICAL IMPRESSION(S) / ED DIAGNOSES  Final diagnoses:  Cough  AKI  (acute kidney injury) ([place])  COPD with acute exacerbation ([place])      NEW MEDICATIONS STARTED DURING THIS VISIT:  New Prescriptions   No medications on file     Note:  This document was prepared using Dragon voice recognition software and may include unintentional dictation errors.    Merlyn Lot, MD 07/24/18 [phone]

## 2018-07-25 DIAGNOSIS — J441 Chronic obstructive pulmonary disease with (acute) exacerbation: Secondary | ICD-10-CM | POA: Diagnosis not present

## 2018-07-25 DIAGNOSIS — R05 Cough: Secondary | ICD-10-CM | POA: Diagnosis not present

## 2018-07-25 LAB — BASIC METABOLIC PANEL
Anion gap: 8 (ref 5–15)
BUN: 41 mg/dL — ABNORMAL HIGH (ref 6–20)
CO2: 22 mmol/L (ref 22–32)
Calcium: 8.7 mg/dL — ABNORMAL LOW (ref 8.9–10.3)
Chloride: 103 mmol/L (ref 98–111)
Creatinine, Ser: 0.99 mg/dL (ref 0.44–1.00)
GFR calc Af Amer: >60 mL/min (ref >60)
GFR calc non Af Amer: >60 mL/min (ref >60)
Glucose, Bld: 471 mg/dL — ABNORMAL HIGH (ref 70–99)
Potassium: 5.6 mmol/L — ABNORMAL HIGH (ref 3.5–5.1)
Sodium: 133 mmol/L — ABNORMAL LOW (ref 135–145)

## 2018-07-25 LAB — CBC
HCT: 41.1 % (ref 36.0–46.0)
Hemoglobin: 13.3 g/dL (ref 12.0–15.0)
MCH: 30.9 pg (ref 26.0–34.0)
MCHC: 32.4 g/dL (ref 30.0–36.0)
MCV: 95.4 fL (ref 80.0–100.0)
Platelets: 298 10*3/uL (ref 150–400)
RBC: 4.31 MIL/uL (ref 3.87–5.11)
RDW: 14.3 % (ref 11.5–15.5)
WBC: 11.4 10*3/uL — ABNORMAL HIGH (ref 4.0–10.5)
nRBC: 0 % (ref 0.0–0.2)

## 2018-07-25 LAB — GLUCOSE, CAPILLARY: Glucose-Capillary: 406 mg/dL — ABNORMAL HIGH (ref 70–99)

## 2018-07-25 LAB — POTASSIUM: Potassium: 5.1 mmol/L (ref 3.5–5.1)

## 2018-07-25 MED ORDER — AZITHROMYCIN 250 MG PO TABS: ORAL_TABLET | ORAL | 0 refills | Status: DC

## 2018-07-25 MED ORDER — PREDNISONE 10 MG PO TABS: ORAL_TABLET | ORAL | 0 refills | Status: DC

## 2018-07-25 MED ORDER — CEFDINIR 300 MG PO CAPS: 600.0000 mg | ORAL_CAPSULE | Freq: Every day | ORAL | 0 refills | Status: DC

## 2018-07-25 MED ORDER — PREDNISONE 50 MG PO TABS: 50.0000 mg | ORAL_TABLET | Freq: Every day | ORAL | Status: DC

## 2018-07-25 MED ORDER — AZITHROMYCIN 250 MG PO TABS
250.0000 mg | ORAL_TABLET | Freq: Every day | ORAL | Status: DC
  Administered 2018-07-25: 11:00:00 250 mg via ORAL
  Filled 2018-07-25: qty 1

## 2018-07-25 MED ORDER — CEFDINIR 300 MG PO CAPS
600.0000 mg | ORAL_CAPSULE | Freq: Every day | ORAL | Status: DC
  Administered 2018-07-25: 600 mg via ORAL
  Filled 2018-07-25: qty 2

## 2018-07-25 MED ORDER — INSULIN ASPART 100 UNIT/ML ~~LOC~~ SOLN: 0.0000 [IU] | Freq: Three times a day (TID) | SUBCUTANEOUS | Status: DC

## 2018-07-25 NOTE — Discharge Summary (Signed)
[facility] NAME: Jennifer [name]    MR#:  [id number]  DATE OF BIRTH:  10-14-1960  DATE OF ADMISSION:  07/24/2018 ADMITTING PHYSICIAN: Saundra Shelling, MD  DATE OF DISCHARGE: 07/25/2018  PRIMARY CARE PHYSICIAN: Jeronimo Greaves, MD    ADMISSION DIAGNOSIS:  Cough [R05] AKI (acute kidney injury) ([place]) [N17.9] COPD with acute exacerbation ([place]) [J44.1]  DISCHARGE DIAGNOSIS:  acute  COPD exacerbation with acute bronchitis COVID-19 pending SECONDARY DIAGNOSIS:   Past Medical History:  Diagnosis Date  . Anxiety   . Arthritis    "qwhere" (06/14/2014)  . Bipolar disorder ([place])   . CAD (coronary artery disease)    a. 2008 PCI/DES to RCA; b. 2012 PCI: 95% mRCA s/p PCI/DES; b. 2/16 PCI DES-> 90% dRCA; c. 11/16 PCI: dRCA ISR s/p PCI/DES;  d. 4/18 PCI: p-mRCA 95% s/p PCI/DES; e. 02/2017 Cath: LML nl, LAD nl, D1 40, LCX min irregs, OM1 60, RCA 50-60p/m ISR, 26md, patent dRCA stent, RPDA 90ost (fills via L->R collats), EF 55-65%.  . Chronic bronchitis ([place]    "get it q yr"  . Daily headache    "when my blood pressure is up" (06/14/2014)  . Depression   . Diastolic dysfunction    a. 02/2017 Echo: EF 55-60%, no rwma, Gr1 DD, nl RV fxn.  .Marland KitchenGERD (gastroesophageal reflux disease)   . Heart murmur   . High cholesterol   . History of stomach ulcers   . Hypertension   . Insomnia   . Kidney stones   . Migraine    "haven't had them in a good while; did get them 1-2 times/yr" (06/14/2014)  . Myocardial infarction ([place] 2008; ~ 2012  . Pneumonia    "get it 1-2 times/year" (06/14/2014)  . RLS (restless legs syndrome)   . Seizures ([place]   . Sleep apnea    "they want me to do the lab but I haven't" (06/14/2014)  . Stroke ([facility]    "they say I've had several" (06/14/2014)  . Tobacco abuse   . Type II diabetes mellitus ([place]   . Urinary, incontinence, stress female     HOSPITAL COURSE:   58year old female patient with a known history of  arthritis, coronary disease, bipolar disorder, chronic bronchitis, tobacco abuse, hypertension, pneumonia in the past, sleep apnea presented to the emergency room for cough productive of phlegm as well as shortness of breath.    -New onset COPD with exacerbation Start patient on IV steroids and aggressive nebulization therapy Oxygen via nasal cannula as needed--now on RA  flu test neg and COVID-19 test--pending Droplet and contact precautions  -Acute on chronic bronchitis Start patient on IV Rocephin and Zithromax antibiotic--change to oral abxs  -Tobacco abuse Tobacco cessation counseled to the patient for 6 minutes Nicotine patch offered  -Hypotension--improved Hold diuretics Received IV fluids  -Coronary artery disease Continue aspirin, statin, Ranexa Continue beta-blocker   - DM-2 sugars a bit up due to steorids Resumed home meds  Overall appears at baseline. Pt will go home with SELF Quarantine CONSULTS OBTAINED:    DRUG ALLERGIES:   Allergies  Allergen Reactions  . Carbamazepine Anaphylaxis and Other (See Comments)    Blood dyscrasia when on with Lithium  . Lithium Other (See Comments)    Blood dyscrasia  . Lodine [Etodolac] Shortness Of Breath, Diarrhea, Nausea And Vomiting and Rash  . Codeine Other (See Comments)    Upset stomach  . Levofloxacin In D5w Swelling and  Other (See Comments)    "Salty" sensation in mouth. "Hard time to explain it"  . Methocarbamol Other (See Comments)    Unknown  . Tape Other (See Comments)    Welps, blister on skin  . Relafen [Nabumetone] Diarrhea, Nausea And Vomiting and Rash    DISCHARGE MEDICATIONS:   Allergies as of 07/25/2018      Reactions   Carbamazepine Anaphylaxis, Other (See Comments)   Blood dyscrasia when on with Lithium   Lithium Other (See Comments)   Blood dyscrasia   Lodine [etodolac] Shortness Of Breath, Diarrhea, Nausea And Vomiting, Rash   Codeine Other (See Comments)   Upset stomach   Levofloxacin  In D5w Swelling, Other (See Comments)   "Salty" sensation in mouth. "Hard time to explain it"   Methocarbamol Other (See Comments)   Unknown   Tape Other (See Comments)   Welps, blister on skin   Relafen [nabumetone] Diarrhea, Nausea And Vomiting, Rash      Medication List    STOP taking these medications   amoxicillin-clavulanate 875-125 MG tablet Commonly known as:  AUGMENTIN   nicotine 14 mg/24hr patch Commonly known as:  NICODERM CQ - dosed in mg/24 hours   oxyCODONE 5 MG immediate release tablet Commonly known as:  Oxy IR/ROXICODONE     TAKE these medications   acetaminophen 325 MG tablet Commonly known as:  TYLENOL Take 2 tablets (650 mg total) by mouth every 6 (six) hours as needed for mild pain (or Fever >/= 101).   albuterol 108 (90 Base) MCG/ACT inhaler Commonly known as:  PROVENTIL HFA;VENTOLIN HFA Inhale 2 puffs into the lungs every 4 (four) hours as needed for wheezing or shortness of breath.   amLODipine 5 MG tablet Commonly known as:  NORVASC Take 5 mg by mouth daily.   aspirin 81 MG EC tablet Take 1 tablet (81 mg total) by mouth daily.   atorvastatin 80 MG tablet Commonly known as:  LIPITOR Take 80 mg by mouth at bedtime.   azithromycin 250 MG tablet Commonly known as:  ZITHROMAX Take as directed Start taking on:  July 26, 2018   benzonatate 100 MG capsule Commonly known as:  TESSALON Take 100 mg by mouth 3 (three) times daily as needed for cough.   Blood Glucose Monitoring Suppl w/Device Kit Test 1-2 times daily; E11.65 diagnosis   cefdinir 300 MG capsule Commonly known as:  OMNICEF Take 2 capsules (600 mg total) by mouth daily. Start taking on:  July 26, 2018   clopidogrel 75 MG tablet Commonly known as:  PLAVIX Take 75 mg by mouth once daily with breakfast   cyclobenzaprine 5 MG tablet Commonly known as:  FLEXERIL Take 5 mg by mouth 3 (three) times daily as needed for muscle spasms.   fluticasone 50 MCG/ACT nasal spray Commonly  known as:  FLONASE Place 2 sprays into both nostrils daily.   furosemide 40 MG tablet Commonly known as:  LASIX Take 40 mg by mouth daily.   gabapentin 600 MG tablet Commonly known as:  NEURONTIN Take 900 mg by mouth 4 (four) times daily.   lisinopril 40 MG tablet Commonly known as:  PRINIVIL,ZESTRIL Take 40 mg by mouth daily.   metoprolol succinate 50 MG 24 hr tablet Commonly known as:  TOPROL-XL Take 1 tablet (50 mg total) by mouth daily. Take with or immediately following a meal. What changed:  Another medication with the same name was removed. Continue taking this medication, and follow the directions you see here.  pantoprazole 40 MG tablet Commonly known as:  PROTONIX TAKE 1 TABLET(40 MG) BY MOUTH DAILY What changed:  See the new instructions.   polyethylene glycol packet Commonly known as:  MIRALAX / GLYCOLAX Take 17 g by mouth daily as needed for mild constipation.   predniSONE 10 MG tablet Commonly known as:  DELTASONE Take 40 mg daily taper by 10 mg daily then stop   Ranexa 1000 MG SR tablet Generic drug:  ranolazine TAKE 1 TABLET BY MOUTH TWICE DAILY( EVERY TWELVE HOURS) What changed:    how much to take  how to take this  when to take this  additional instructions   Tresiba FlexTouch 200 UNIT/ML Sopn Generic drug:  Insulin Degludec Inject 66 Units into the skin at bedtime.   Victoza 18 MG/3ML Sopn Generic drug:  liraglutide Inject 1.8 mg into the skin daily.       If you experience worsening of your admission symptoms, develop shortness of breath, life threatening emergency, suicidal or homicidal thoughts you must seek medical attention immediately by calling 911 or calling your MD immediately  if symptoms less severe.  You Must read complete instructions/literature along with all the possible adverse reactions/side effects for all the Medicines you take and that have been prescribed to you. Take any new Medicines after you have completely  understood and accept all the possible adverse reactions/side effects.   Please note  You were cared for by a hospitalist during your hospital stay. If you have any questions about your discharge medications or the care you received while you were in the hospital after you are discharged, you can call the unit and asked to speak with the hospitalist on call if the hospitalist that took care of you is not available. Once you are discharged, your primary care physician will handle any further medical issues. Please note that NO REFILLS for any discharge medications will be authorized once you are discharged, as it is imperative that you return to your primary care physician (or establish a relationship with a primary care physician if you do not have one) for your aftercare needs so that they can reassess your need for medications and monitor your lab values. Today   SUBJECTIVE  No new complaints. Feels better  VITAL SIGNS:  Blood pressure (!) 133/95, pulse 83, temperature (!) 97.5 F (36.4 C), temperature source Axillary, resp. rate 16, height '5\' 2"'$  (1.575 m), weight 93 kg, SpO2 94 %.  I/O:    Intake/Output Summary (Last 24 hours) at 07/25/2018 1132 Last data filed at 07/24/2018 1950 Gross per 24 hour  Intake 600 ml  Output 500 ml  Net 100 ml    PHYSICAL EXAMINATION:  GENERAL:  59 y.o.-year-old patient lying in the bed with no acute distress. Obese EYES: Pupils equal, round, reactive to light and accommodation. No scleral icterus. Extraocular muscles intact.  HEENT: Head atraumatic, normocephalic. Oropharynx and nasopharynx clear.  NECK:  Supple, no jugular venous distention. No thyroid enlargement, no tenderness.  LUNGS: Normal breath sounds bilaterally, no wheezing,no rales, mildrhonchi or crepitation. No use of accessory muscles of respiration.  CARDIOVASCULAR: S1, S2 normal. No murmurs, rubs, or gallops.  ABDOMEN: Soft, non-tender, non-distended. Bowel sounds present. No organomegaly  or mass.  EXTREMITIES: No pedal edema, cyanosis, or clubbing.  NEUROLOGIC: Cranial nerves II through XII are intact. Muscle strength 5/5 in all extremities. Sensation intact. Gait not checked.  PSYCHIATRIC: The patient is alert and oriented x 3.  SKIN: No obvious rash, lesion, or ulcer.  DATA REVIEW:   CBC  Recent Labs  Lab 07/25/18 0443  WBC 11.4*  HGB 13.3  HCT 41.1  PLT 298    Chemistries  Recent Labs  Lab 07/24/18 1701 07/25/18 0443 07/25/18 0944  NA 134* 133*  --   K 4.3 5.6* 5.1  CL 104 103  --   CO2 20* 22  --   GLUCOSE 326* 471*  --   BUN 51* 41*  --   CREATININE 1.56* 0.99  --   CALCIUM 8.8* 8.7*  --   AST 13*  --   --   ALT 13  --   --   ALKPHOS 87  --   --   BILITOT 0.7  --   --     Microbiology Results   No results found for this or any previous visit (from the past 240 hour(s)).  RADIOLOGY:  Dg Chest Portable 1 View  Result Date: 07/24/2018 CLINICAL DATA:  58 year old female with cough and shortness of breath. EXAM: PORTABLE CHEST 1 VIEW COMPARISON:  Chest radiograph dated 02/09/2017 FINDINGS: There is mild emphysema. Bibasilar interstitial prominence, likely atelectatic changes. Developing infiltrate is less likely. No large pleural effusion. No pneumothorax. The cardiac silhouette is within normal limits. No acute osseous pathology. Degenerative changes of the spine and right shoulder. IMPRESSION: Bibasilar interstitial prominence, likely atelectatic changes. Developing infiltrate is less likely. Electronically Signed   By: Anner Crete M.D.   On: 07/24/2018 17:04     CODE STATUS:     Code Status Orders  (From admission, onward)         Start     Ordered   07/24/18 1934  Full code  Continuous     07/24/18 1933        Code Status History    Date Active Date Inactive Code Status Order ID Comments User Context   02/07/2018 0047 02/10/2018 2123 Full Code [id number]  Lance Coon, MD Inpatient   06/09/2017 1529 06/10/2017 1608 Full Code  [id number]  Nelva Bush, MD Inpatient   02/19/2017 1204 02/19/2017 1651 Full Code [id number]  Minna Merritts, MD Inpatient   08/02/2016 0753 08/05/2016 1628 Full Code [id number]  Saundra Shelling, MD Inpatient   02/26/2015 1350 02/27/2015 1651 Full Code [id number]  Wellington Hampshire, MD Inpatient   02/23/2015 0819 02/26/2015 1350 Full Code [id number]  Harrie Foreman, MD Inpatient   06/15/2014 1229 06/16/2014 1754 Full Code [id number]  Jettie Booze, MD Inpatient   06/14/2014 0847 06/15/2014 1229 Full Code [id number]  Karen Kitchens Inpatient   06/14/2014 0725 06/14/2014 0847 Full Code [id number]  Louellen Molder, MD Inpatient      TOTAL TIME TAKING CARE OF THIS PATIENT: *40* minutes.    Fritzi Mandes M.D on 07/25/2018 at 11:32 AM  Between 7am to 6pm - Pager - [phone] After 6pm go to [email or web address] - password [email or web address] [facility]  Office  [phone]  CC: Primary care physician; Jeronimo Greaves, MD

## 2018-07-25 NOTE — Progress Notes (Signed)
fsbs 268 

## 2018-07-25 NOTE — Plan of Care (Signed)
  Problem: Education: Goal: Knowledge of General Education information will improve Description: Including pain rating scale, medication(s)/side effects and non-pharmacologic comfort measures Outcome: Progressing   Problem: Pain Managment: Goal: General experience of comfort will improve Outcome: Progressing   

## 2018-07-25 NOTE — Progress Notes (Signed)
Pt to be discharged today. Potassium level reported to dr. Allena Katz. Iv removed disch instkructions given along with prescriptions. disch via w.c.

## 2018-07-26 ENCOUNTER — Telehealth: Payer: Self-pay | Admitting: Emergency Medicine

## 2018-07-26 LAB — NOVEL CORONAVIRUS, NAA (HOSP ORDER, SEND-OUT TO REF LAB; TAT 18-24 HRS): SARS-CoV-2, NAA: NOT DETECTED

## 2018-07-26 NOTE — Telephone Encounter (Signed)
Called patient and informed her of negative Covid 19 test.

## 2018-07-27 ENCOUNTER — Ambulatory Visit: Payer: Medicare Other | Admitting: Cardiovascular Disease

## 2018-08-10 ENCOUNTER — Other Ambulatory Visit: Payer: Self-pay | Admitting: Cardiovascular Disease

## 2018-08-10 NOTE — Telephone Encounter (Signed)
She states she is taking Metoprolol 50 mg. Yesterday when she checked her BP it was 118/70, and has been running about this, she has not taken it today.

## 2018-08-10 NOTE — Telephone Encounter (Signed)
OK- thank you for checking. Would NOT refill the 200 mg tablets of metoprolol.   She should be good on the 50 mg tablets of metoprolol.  Thanks!

## 2018-08-10 NOTE — Telephone Encounter (Signed)
Please review for refill.  Lisinopril 40 was not listed on most recent tele- visit, it was listed on a previous office visit with Dr. Kirke Corin 6/19

## 2018-08-10 NOTE — Telephone Encounter (Signed)
LMOM patient to call with clarification of current medications. Pharmacy is requesting refill of Lisinopril 40 mg. See previous note.

## 2018-08-10 NOTE — Telephone Encounter (Signed)
Spoke with patient reference the refill request from [facility] for Lisinopril 40 mg.  She has not taken it since her hospital visit.  With her BP staying in the  118/72 to 116/74 she has not started it back. She does not need a refill at this time.  She had a Tele-visit with Dr. Mariah Milling 07/20/2018 and was not told to restart so she has not been taking it.She has also reduced her Metoprolol dose to 50 mg daily per Dr. Windell Hummingbird instructions.  Advised patient to continue to monitor her BP and to call the office of Dr. Mariah Milling if she has any concens.

## 2018-08-10 NOTE — Telephone Encounter (Signed)
Per discharge summary 02/10/18, lisinopril was stopped due to hypotension. 07/20/18- E-visit with Dr. Mariah Milling and lisinopril was not on her medication list- no changes made that day She was discharged 07/25/18 after and ER visit for cough, with lisinopril 40 mg once daily on her list.  I do not see that they restarted this in the hospital and no refills were sent in for any meds at discharge.  Will need to check with the patient to see if she is taking this.  This is the 2nd medication in question for her today. If she is home, please have her pull all of her medication bottles to re-verify what she is taking.

## 2018-08-10 NOTE — Telephone Encounter (Signed)
Do you mind calling her to verify the dose of metoprolol succinate that she is taking. If she is on 200 mg once daily will need to review with MD prior to refilling.  Does she check her BP at home? If so, what is she running?  Thank you!

## 2018-08-10 NOTE — Telephone Encounter (Signed)
Please review for refill. Medication dosage appears to have been changed at most recent tele-visit.

## 2018-08-30 ENCOUNTER — Other Ambulatory Visit: Payer: Self-pay | Admitting: Physician Assistant

## 2018-11-01 ENCOUNTER — Other Ambulatory Visit: Payer: Self-pay | Admitting: Internal Medicine

## 2018-11-01 NOTE — Progress Notes (Signed)
x

## 2018-12-06 ENCOUNTER — Other Ambulatory Visit: Payer: Self-pay | Admitting: Physician Assistant

## 2018-12-21 ENCOUNTER — Other Ambulatory Visit: Payer: Self-pay | Admitting: Cardiovascular Disease

## 2018-12-21 ENCOUNTER — Other Ambulatory Visit: Payer: Self-pay | Admitting: Physician Assistant

## 2019-01-17 ENCOUNTER — Other Ambulatory Visit: Payer: Self-pay | Admitting: Physician Assistant

## 2019-03-07 ENCOUNTER — Telehealth: Payer: Self-pay

## 2019-03-07 MED ORDER — ATORVASTATIN CALCIUM 80 MG PO TABS: ORAL_TABLET | ORAL | 0 refills | Status: DC

## 2019-03-07 NOTE — Telephone Encounter (Signed)
Requested Prescriptions   Signed Prescriptions Disp Refills  . atorvastatin (LIPITOR) 80 MG tablet 30 tablet 0    Sig: TAKE 1 TABLET(80 MG) BY MOUTH DAILY *NEEDS OFFICE VISIT FOR FURTHER REFILLS*    Authorizing Provider: Minna Merritts    Ordering User: Raelene Bott, Jaizon Deroos L

## 2019-04-04 ENCOUNTER — Other Ambulatory Visit: Payer: Self-pay | Admitting: Cardiovascular Disease

## 2019-05-30 ENCOUNTER — Other Ambulatory Visit: Payer: Self-pay | Admitting: Cardiovascular Disease

## 2019-05-30 ENCOUNTER — Other Ambulatory Visit: Payer: Self-pay

## 2019-05-30 MED ORDER — ATORVASTATIN CALCIUM 80 MG PO TABS: ORAL_TABLET | ORAL | 3 refills | Status: DC

## 2019-05-30 MED ORDER — ATORVASTATIN CALCIUM 80 MG PO TABS: ORAL_TABLET | ORAL | 0 refills | Status: DC

## 2019-07-12 ENCOUNTER — Emergency Department
Admission: EM | Admit: 2019-07-12 | Discharge: 2019-07-12 | Disposition: A | Discharge status: Home / Self Care | Payer: Medicare Other | Attending: Student | Admitting: Student

## 2019-07-12 ENCOUNTER — Other Ambulatory Visit: Payer: Self-pay

## 2019-07-12 DIAGNOSIS — N3000 Acute cystitis without hematuria: Secondary | ICD-10-CM

## 2019-07-12 DIAGNOSIS — I10 Essential (primary) hypertension: Secondary | ICD-10-CM | POA: Insufficient documentation

## 2019-07-12 DIAGNOSIS — Z79899 Other long term (current) drug therapy: Secondary | ICD-10-CM | POA: Insufficient documentation

## 2019-07-12 DIAGNOSIS — F17210 Nicotine dependence, cigarettes, uncomplicated: Secondary | ICD-10-CM | POA: Diagnosis not present

## 2019-07-12 DIAGNOSIS — Z955 Presence of coronary angioplasty implant and graft: Secondary | ICD-10-CM | POA: Insufficient documentation

## 2019-07-12 DIAGNOSIS — Z7901 Long term (current) use of anticoagulants: Secondary | ICD-10-CM | POA: Insufficient documentation

## 2019-07-12 DIAGNOSIS — E1165 Type 2 diabetes mellitus with hyperglycemia: Secondary | ICD-10-CM | POA: Diagnosis not present

## 2019-07-12 DIAGNOSIS — Z794 Long term (current) use of insulin: Secondary | ICD-10-CM | POA: Diagnosis not present

## 2019-07-12 DIAGNOSIS — Z7982 Long term (current) use of aspirin: Secondary | ICD-10-CM | POA: Insufficient documentation

## 2019-07-12 DIAGNOSIS — I2510 Atherosclerotic heart disease of native coronary artery without angina pectoris: Secondary | ICD-10-CM | POA: Diagnosis not present

## 2019-07-12 DIAGNOSIS — R300 Dysuria: Secondary | ICD-10-CM | POA: Diagnosis present

## 2019-07-12 LAB — CBC WITH DIFFERENTIAL/PLATELET
Abs Immature Granulocytes: 0.03 10*3/uL (ref 0.00–0.07)
Basophils Absolute: 0 10*3/uL (ref 0.0–0.1)
Basophils Relative: 0 %
Eosinophils Absolute: 0.3 10*3/uL (ref 0.0–0.5)
Eosinophils Relative: 3 %
HCT: 39.4 % (ref 36.0–46.0)
Hemoglobin: 12.9 g/dL (ref 12.0–15.0)
Immature Granulocytes: 0 %
Lymphocytes Relative: 30 %
Lymphs Abs: 3.1 10*3/uL (ref 0.7–4.0)
MCH: 30.6 pg (ref 26.0–34.0)
MCHC: 32.7 g/dL (ref 30.0–36.0)
MCV: 93.4 fL (ref 80.0–100.0)
Monocytes Absolute: 0.4 10*3/uL (ref 0.1–1.0)
Monocytes Relative: 4 %
Neutro Abs: 6.5 10*3/uL (ref 1.7–7.7)
Neutrophils Relative %: 63 %
Platelets: 286 10*3/uL (ref 150–400)
RBC: 4.22 MIL/uL (ref 3.87–5.11)
RDW: 13.6 % (ref 11.5–15.5)
WBC: 10.4 10*3/uL (ref 4.0–10.5)
nRBC: 0 % (ref 0.0–0.2)

## 2019-07-12 LAB — COMPREHENSIVE METABOLIC PANEL
ALT: 18 U/L (ref 0–44)
AST: 11 U/L — ABNORMAL LOW (ref 15–41)
Albumin: 3.5 g/dL (ref 3.5–5.0)
Alkaline Phosphatase: 91 U/L (ref 38–126)
Anion gap: 8 (ref 5–15)
BUN: 33 mg/dL — ABNORMAL HIGH (ref 6–20)
CO2: 23 mmol/L (ref 22–32)
Calcium: 9.1 mg/dL (ref 8.9–10.3)
Chloride: 105 mmol/L (ref 98–111)
Creatinine, Ser: 1.07 mg/dL — ABNORMAL HIGH (ref 0.44–1.00)
GFR calc Af Amer: >60 mL/min (ref >60)
GFR calc non Af Amer: 58 mL/min — ABNORMAL LOW (ref >60)
Glucose, Bld: 339 mg/dL — ABNORMAL HIGH (ref 70–99)
Potassium: 4.4 mmol/L (ref 3.5–5.1)
Sodium: 136 mmol/L (ref 135–145)
Total Bilirubin: 0.4 mg/dL (ref 0.3–1.2)
Total Protein: 6.4 g/dL — ABNORMAL LOW (ref 6.5–8.1)

## 2019-07-12 LAB — URINALYSIS, COMPLETE (UACMP) WITH MICROSCOPIC
Bacteria, UA: NONE SEEN
Bilirubin Urine: NEGATIVE
Glucose, UA: >=500 mg/dL — AB
Ketones, ur: NEGATIVE mg/dL
Nitrite: POSITIVE — AB
Protein, ur: 30 mg/dL — AB
Specific Gravity, Urine: 1.023 (ref 1.005–1.030)
WBC, UA: >50 WBC/hpf — ABNORMAL HIGH (ref 0–5)
pH: 5 (ref 5.0–8.0)

## 2019-07-12 MED ORDER — SODIUM CHLORIDE 0.9 % IV BOLUS
1000.0000 mL | Freq: Once | INTRAVENOUS | Status: AC
  Administered 2019-07-12: 1000 mL via INTRAVENOUS

## 2019-07-12 MED ORDER — IBUPROFEN 800 MG PO TABS
800.0000 mg | ORAL_TABLET | Freq: Once | ORAL | Status: AC
  Administered 2019-07-12: 800 mg via ORAL
  Filled 2019-07-12: qty 1

## 2019-07-12 MED ORDER — CEPHALEXIN 500 MG PO CAPS: 500.0000 mg | ORAL_CAPSULE | Freq: Four times a day (QID) | ORAL | 0 refills | Status: DC

## 2019-07-12 MED ORDER — IBUPROFEN 100 MG/5ML PO SUSP: 800.0000 mg | Freq: Once | ORAL | Status: DC

## 2019-07-12 MED ORDER — CEPHALEXIN 500 MG PO CAPS: 1000.0000 mg | ORAL_CAPSULE | Freq: Once | ORAL | Status: DC

## 2019-07-12 MED ORDER — SODIUM CHLORIDE 0.9 % IV SOLN
2.0000 g | Freq: Once | INTRAVENOUS | Status: AC
  Administered 2019-07-12: 2 g via INTRAVENOUS
  Filled 2019-07-12: qty 20

## 2019-07-12 NOTE — ED Triage Notes (Signed)
Pt comes via POV from home with c/o possible UTI. Pt states this started a few days ago. Pt states burning and pain when urinating.

## 2019-07-12 NOTE — ED Provider Notes (Signed)
[facility] Emergency Department Provider Note  ____________________________________________  Time seen: Approximately 7:27 PM  I have reviewed the triage vital signs and the nursing notes.   HISTORY  Chief Complaint UTI    HPI Jennifer [name] is a 59 y.o. female who presents the emergency department complaining of dysuria, polyuria.  Patient states that she has developed symptoms over the past several days.  She does have some suprapubic cramping/tightness sensation.  She states that she is having dysuria but no hematuria.  No flank pain.  No nausea or vomiting.  Patient is a diabetic, states that with her personal stress over the past several days given some family events, she has not been eating appropriately, may have missed a couple of her doses of medication.  At this time, patient denies any headache, visual changes, fevers or chills, chest pain, shortness of breath, abdominal pain, nausea vomiting, diarrhea or constipation.  Patient has urinary symptoms of dysuria, polyuria.  No polydipsia.         Past Medical History:  Diagnosis Date  . Anxiety   . Arthritis    "qwhere" (06/14/2014)  . Bipolar disorder ([place])   . CAD (coronary artery disease)    a. 2008 PCI/DES to RCA; b. 2012 PCI: 95% mRCA s/p PCI/DES; b. 2/16 PCI DES-> 90% dRCA; c. 11/16 PCI: dRCA ISR s/p PCI/DES;  d. 4/18 PCI: p-mRCA 95% s/p PCI/DES; e. 02/2017 Cath: LML nl, LAD nl, D1 40, LCX min irregs, OM1 60, RCA 50-60p/m ISR, 70md, patent dRCA stent, RPDA 90ost (fills via L->R collats), EF 55-65%.  . Chronic bronchitis ([place]    "get it q yr"  . Daily headache    "when my blood pressure is up" (06/14/2014)  . Depression   . Diastolic dysfunction    a. 02/2017 Echo: EF 55-60%, no rwma, Gr1 DD, nl RV fxn.  .Marland KitchenGERD (gastroesophageal reflux disease)   . Heart murmur   . High cholesterol   . History of stomach ulcers   . Hypertension   . Insomnia   . Kidney stones   . Migraine    "haven't  had them in a good while; did get them 1-2 times/yr" (06/14/2014)  . Myocardial infarction ([place] 2008; ~ 2012  . Pneumonia    "get it 1-2 times/year" (06/14/2014)  . RLS (restless legs syndrome)   . Seizures ([place]   . Sleep apnea    "they want me to do the lab but I haven't" (06/14/2014)  . Stroke ([facility]    "they say I've had several" (06/14/2014)  . Tobacco abuse   . Type II diabetes mellitus ([place]   . Urinary, incontinence, stress female     Patient Active Problem List   Diagnosis Date Noted  . COPD exacerbation ([place] 07/24/2018  . Chronic obstructive pulmonary disease ([place] 07/20/2018  . Cellulitis 02/06/2018  . Sepsis ([place] 02/06/2018  . Abnormal stress test 06/09/2017  . Unstable angina ([place] 08/03/2016  . Morbid obesity ([place] 05/02/2016  . Chest pain with moderate risk for cardiac etiology 03/14/2015  . CAD (coronary artery disease)   . Hospital discharge follow-up 03/07/2015  . Poorly controlled type 2 diabetes mellitus with complication ([place] [id number] . Hyperlipidemia 06/22/2014  . Stented coronary artery   . Chronic pain syndrome   . Chest pain at rest 06/14/2014  . Acute bronchitis 06/14/2014  . Tobacco abuse 06/14/2014  . Essential hypertension 06/14/2014  . GERD (gastroesophageal reflux disease) 06/14/2014  . Bipolar disorder ([place] 06/14/2014  .  Chest pain 06/14/2014    Past Surgical History:  Procedure Laterality Date  . ABDOMINAL HYSTERECTOMY  1984   "I've got 1 ovary left"  . CARDIAC CATHETERIZATION N/A 02/26/2015   Procedure: Left Heart Cath and Coronary Angiography;  Surgeon: Wellington Hampshire, MD;  Location: [place] CV LAB;  Service: Cardiovascular;  Laterality: N/A;  . CARDIAC CATHETERIZATION N/A 02/26/2015   Procedure: Coronary Stent Intervention;  Surgeon: Wellington Hampshire, MD;  Location: [place] CV LAB;  Service: Cardiovascular;  Laterality: N/A;  . CESAREAN SECTION  1984  . CORONARY ANGIOPLASTY WITH STENT PLACEMENT  2008; ~ 2012   "1; 1"   . CORONARY BALLOON ANGIOPLASTY N/A 06/09/2017   Procedure: CORONARY BALLOON ANGIOPLASTY;  Surgeon: Nelva Bush, MD;  Location: [place] CV LAB;  Service: Cardiovascular;  Laterality: N/A;  . CORONARY STENT INTERVENTION N/A 08/04/2016   Procedure: Coronary Stent Intervention;  Surgeon: Wellington Hampshire, MD;  Location: [place] CV LAB;  Service: Cardiovascular;  Laterality: N/A;  . DILATION AND CURETTAGE OF UTERUS    . EXTRACORPOREAL SHOCK WAVE LITHOTRIPSY  X 2  . I & D EXTREMITY Left 02/09/2018   Procedure: IRRIGATION AND DEBRIDEMENT EXTREMITY;  Surgeon: Herbert Pun, MD;  Location: [facility] ORS;  Service: General;  Laterality: Left;  . KNEE ARTHROSCOPY Left   . LEFT HEART CATH Bilateral 08/04/2016   Procedure: Left Heart Cath poss PCI;  Surgeon: Wellington Hampshire, MD;  Location: [place] CV LAB;  Service: Cardiovascular;  Laterality: Bilateral;  . LEFT HEART CATH AND CORONARY ANGIOGRAPHY Left 02/19/2017   Procedure: LEFT HEART CATH AND CORONARY ANGIOGRAPHY;  Surgeon: Minna Merritts, MD;  Location: [place] CV LAB;  Service: Cardiovascular;  Laterality: Left;  . LEFT HEART CATH AND CORONARY ANGIOGRAPHY Left 06/09/2017   Procedure: LEFT HEART CATH AND CORONARY ANGIOGRAPHY;  Surgeon: Nelva Bush, MD;  Location: [place] CV LAB;  Service: Cardiovascular;  Laterality: Left;  . LEFT HEART CATHETERIZATION WITH CORONARY ANGIOGRAM N/A 06/15/2014   Procedure: LEFT HEART CATHETERIZATION WITH CORONARY ANGIOGRAM;  Surgeon: Jettie Booze, MD;  Location: [facility] CATH LAB;  Service: Cardiovascular;  Laterality: N/A;  . PERCUTANEOUS CORONARY STENT INTERVENTION (PCI-S)  06/15/2014   Procedure: PERCUTANEOUS CORONARY STENT INTERVENTION (PCI-S);  Surgeon: Jettie Booze, MD;  Location[facility] C[facility] LAB;  Service: Cardiovascular;;  . TONSILLECTOMY    . TUBAL LIGATION      Prior to Admission medications   Medication Sig Start Date End Date Taking? Authorizing Provider   acetaminophen (TYLENOL) 325 MG tablet Take 2 tablets (650 mg total) by mouth every 6 (six) hours as needed for mild pain (or Fever >/= 101). 02/10/18   Gouru, Illene Silver, MD  albuterol (PROVENTIL HFA;VENTOLIN HFA) 108 (90 Base) MCG/ACT inhaler Inhale 2 puffs into the lungs every 4 (four) hours as needed for wheezing or shortness of breath.    [provider]  amLODipine (NORVASC) 5 MG tablet Take 5 mg by mouth daily.    [provider]  aspirin EC 81 MG EC tablet Take 1 tablet (81 mg total) by mouth daily. 02/27/15   Gladstone Lighter, MD  atorvastatin (LIPITOR) 80 MG tablet TAKE 1 TABLET(80 MG) BY MOUTH DAILY 05/30/19   Minna Merritts, MD  azithromycin (ZITHROMAX) 250 MG tablet Take as directed 07/26/18   Fritzi Mandes, MD  benzonatate (TESSALON) 100 MG capsule Take 100 mg by mouth 3 (three) times daily as needed for cough.    [provider]  Blood Glucose Monitoring  Suppl W/DEVICE KIT Test 1-2 times daily; E11.65 diagnosis 03/02/15   Rubbie Battiest, RN  cefdinir (OMNICEF) 300 MG capsule Take 2 capsules (600 mg total) by mouth daily. 07/26/18   Fritzi Mandes, MD  cephALEXin (KEFLEX) 500 MG capsule Take 1 capsule (500 mg total) by mouth 4 (four) times daily. 07/12/19   Darl Kuss, Charline Bills, PA-C  clopidogrel (PLAVIX) 75 MG tablet TAKE 1 TABLET BY MOUTH EVERY DAY WITH BREAKFAST 12/21/18   Minna Merritts, MD  cyclobenzaprine (FLEXERIL) 5 MG tablet Take 5 mg by mouth 3 (three) times daily as needed for muscle spasms.  07/17/16   [provider]  fluticasone (FLONASE) 50 MCG/ACT nasal spray Place 2 sprays into both nostrils daily.    [provider]  furosemide (LASIX) 20 MG tablet TAKE 1 TABLET BY MOUTH EVERY DAY 12/21/18   Minna Merritts, MD  furosemide (LASIX) 40 MG tablet Take 40 mg by mouth daily. 12/20/17   [provider]  gabapentin (NEURONTIN) 600 MG tablet Take 900 mg by mouth 4 (four) times daily.     [provider]  liraglutide (VICTOZA)  18 MG/3ML SOPN Inject 1.8 mg into the skin daily.  02/20/17   [provider]  lisinopril (PRINIVIL,ZESTRIL) 40 MG tablet Take 40 mg by mouth daily.    [provider]  metoprolol succinate (TOPROL-XL) 50 MG 24 hr tablet Take 1 tablet (50 mg total) by mouth daily. Take with or immediately following a meal. 07/20/18   Gollan, Kathlene November, MD  pantoprazole (PROTONIX) 40 MG tablet TAKE 1 TABLET(40 MG) BY MOUTH DAILY 12/21/18   Gollan, Kathlene November, MD  polyethylene glycol (MIRALAX / GLYCOLAX) packet Take 17 g by mouth daily as needed for mild constipation.    [provider]  predniSONE (DELTASONE) 10 MG tablet Take 40 mg daily taper by 10 mg daily then stop 07/25/18   Fritzi Mandes, MD  ranolazine (RANEXA) 1000 MG SR tablet TAKE 1 TABLET BY MOUTH TWICE DAILY( EVERY TWELVE HOURS) 01/18/19   Minna Merritts, MD  ranolazine (RANEXA) 500 MG 12 hr tablet TAKE 1 TABLET BY MOUTH TWICE A DAY 12/21/18   Gollan, Kathlene November, MD  TRESIBA FLEXTOUCH 200 UNIT/ML SOPN Inject 66 Units into the skin at bedtime.     [provider]    Allergies Carbamazepine, Lithium, Lodine [etodolac], Codeine, Levofloxacin in d5w, Methocarbamol, Tape, and Relafen [nabumetone]  Family History  Problem Relation Age of Onset  . Lung cancer Mother   . Cirrhosis Father     Social History Social History   Tobacco Use  . Smoking status: Current Every Day Smoker    Packs/day: 0.25    Years: 35.00    Pack years: 8.75    Types: Cigarettes  . Smokeless tobacco: Never Used  . Tobacco comment: Patches  Substance Use Topics  . Alcohol use: Yes    Alcohol/week: 0.0 standard drinks    Comment: occ  . Drug use: Yes    Types: Marijuana     Review of Systems  Constitutional: No fever/chills.  Weakness and fatigue. Eyes: No visual changes. No discharge ENT: No upper respiratory complaints. Cardiovascular: no chest pain. Respiratory: no cough. No SOB. Gastrointestinal: No abdominal pain.  No nausea, no  vomiting.  No diarrhea.  No constipation. Genitourinary: Burning with urination, suprapubic cramping. No hematuria Musculoskeletal: Negative for musculoskeletal pain. Skin: Negative for rash, abrasions, lacerations, ecchymosis. Neurological: Negative for headaches, focal weakness or numbness. 10-point ROS otherwise negative.  ____________________________________________   PHYSICAL EXAM:  VITAL SIGNS: ED Triage Vitals  Enc Vitals Group     BP 07/12/19 1817 (!) 134/92     Pulse Rate 07/12/19 1817 96     Resp 07/12/19 1817 20     Temp 07/12/19 1817 98.5 F (36.9 C)     Temp Source 07/12/19 1817 Oral     SpO2 07/12/19 1817 98 %     Weight 07/12/19 1818 213 lb (96.6 kg)     Height 07/12/19 1818 '5\' 2"'$  (1.575 m)     Head Circumference --      Peak Flow --      Pain Score 07/12/19 1826 8     Pain Loc --      Pain Edu? --      Excl. in [place]? --      Constitutional: Alert and oriented. Well appearing and in no acute distress. Eyes: Conjunctivae are normal. PERRL. EOMI. Head: Atraumatic. ENT:      Ears:       Nose: No congestion/rhinnorhea.      Mouth/Throat: Mucous membranes are moist.  Neck: No stridor.    Cardiovascular: Normal rate, regular rhythm. Normal S1 and S2.  Good peripheral circulation. Respiratory: Normal respiratory effort without tachypnea or retractions. Lungs CTAB. Good air entry to the bases with no decreased or absent breath sounds. Gastrointestinal: No visible abdominal wall findings.  Bowel sounds 4 quadrants. Soft and nontender to palpation. No guarding or rigidity. No palpable masses. No distention.  Mild left-sided CVA tenderness. Musculoskeletal: Full range of motion to all extremities. No gross deformities appreciated. Neurologic:  Normal speech and language. No gross focal neurologic deficits are appreciated.  Skin:  Skin is warm, dry and intact. No rash noted. Psychiatric: Mood and affect are normal. Speech and behavior are normal. Patient exhibits  appropriate insight and judgement.   ____________________________________________   LABS (all labs ordered are listed, but only abnormal results are displayed)  Labs Reviewed  URINALYSIS, COMPLETE (UACMP) WITH MICROSCOPIC - Abnormal; Notable for the following components:      Result Value   Color, Urine AMBER (*)    APPearance HAZY (*)    Glucose, UA >=500 (*)    Hgb urine dipstick SMALL (*)    Protein, ur 30 (*)    Nitrite POSITIVE (*)    Leukocytes,Ua MODERATE (*)    WBC, UA >50 (*)    All other components within normal limits  COMPREHENSIVE METABOLIC PANEL - Abnormal; Notable for the following components:   Glucose, Bld 339 (*)    BUN 33 (*)    Creatinine, Ser 1.07 (*)    Total Protein 6.4 (*)    AST 11 (*)    GFR calc non Af Amer 58 (*)    All other components within normal limits  CBC WITH DIFFERENTIAL/PLATELET  CBG MONITORING, ED   ____________________________________________  EKG   ____________________________________________  RADIOLOGY   No results found.  ____________________________________________    PROCEDURES  Procedure(s) performed:    Procedures    Medications  sodium chloride 0.9 % bolus 1,000 mL (1,000 mLs Intravenous New Bag/Given 07/12/19 2119)  cefTRIAXone (ROCEPHIN) 2 g in sodium chloride 0.9 % 100 mL IVPB (2 g Intravenous New Bag/Given 07/12/19 2113)  ibuprofen (ADVIL) tablet 800 mg (800 mg Oral Given 07/12/19 2045)     ____________________________________________   INITIAL IMPRESSION / ASSESSMENT AND PLAN / ED COURSE  Pertinent labs & imaging results that were available during my care of the  patient were reviewed by me and considered in my medical decision making (see chart for details).  Review of the [place] CSRS was performed in accordance of the [place] prior to dispensing any controlled drugs.  Clinical Course as of Jul 12 2234  Tue Jul 12, 2019  1946 Patient presents emergency department complaining of weakness, tiredness,  dysuria, suprapubic cramping.  Patient states that she believes that she has a urinary tract infection as she has been peeing more than normal, having burning urination.  Patient with nitrites, leukocytes on her urine.  Patient was also spilling more than 500 glucose.  At this time, patient states that she has been undergoing significant personal stress, has not been eating as she should, may have skipped some of her dosing for her diabetes.  At this point, I am concerned the patient symptoms that are attributable to the UTI as well as possible diabetic issues.  Given the patient's symptoms, I will give the patient IV fluids, IV Rocephin, Keflex and draw labs.  Patient's labs are reassuring patient is stable for discharge.  This time mostly concerned for diabetic complication.   [JC]    Clinical Course User Index [JC] Strummer Canipe, Charline Bills, PA-C          Patient's diagnosis is consistent with skin UTI, hyperglycemia.  Patient presented to emergency department primarily complaining of urinary tract symptoms.  Patient was spilling significant amount of glucose, states that she had been feeling off for the past week.  As such labs, fluid, antibiotics delivered IV were administered.  No evidence of DKA.  Patient had hyperglycemia but I do feel that this is likely patient's baseline based off of her report.  At this time patient is stable for discharge.  Patient has been given Rocephin, Keflex here in the emergency department.  She is allergic to fluoroquinolones and I will treat with Keflex at home as well.  While patient had some tenderness extending into the left flank, no frank CVA tenderness, no leukocytosis, no fevers or sepsis criteria..  Return precautions are discussed with the patient.  Follow-up primary care as needed.  Patient is given ED precautions to return to the ED for any worsening or new symptoms.     ____________________________________________  FINAL CLINICAL IMPRESSION(S) / ED  DIAGNOSES  Final diagnoses:  Acute cystitis without hematuria  Hyperglycemia due to diabetes mellitus ([place])      NEW MEDICATIONS STARTED DURING THIS VISIT:  ED Discharge Orders         Ordered    cephALEXin (KEFLEX) 500 MG capsule  4 times daily     07/12/19 2214              This chart was dictated using voice recognition software/Dragon. Despite best efforts to proofread, errors can occur which can change the meaning. Any change was purely unintentional.    Darletta Moll, PA-C 07/12/19 2236    Lilia Pro., MD 07/13/19 [phone]

## 2019-08-01 NOTE — Progress Notes (Deleted)
Cardiology Office Note    Date:  08/01/2019   ID:  ZO LOUDON, DOB 10-03-1960, MRN [id number]  PCP:  Charleen Kirks, MD  Cardiologist:  Julien Nordmann, MD  Electrophysiologist:  None   Chief Complaint: ***  History of Present Illness:   Jennifer [name] is a 59 y.o. 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   She previously underwent stenting to the RCA in 2008 followed by recurrence of chest pain in 2010, with an abnormal stress test that showed prior inferior infarct with peri-infarct ischemia. Cardiac cath in 2010, showed 95% stenosis in the mid RCA, diffuse 60% stenosis in the proximal RDPA. She underwent successful PCI/DES of the mid RCA. She redeevloped chest pain in 05/2014. Troponin was negative. Cardiac cath 06/15/2014, showed widely patent but small left main, mild disease in the LAD and its branches, mild to moderate disease in the LCx and its branches, severe 90% stenosis in the distal RCA s/p PCI/DES, normal LVSF, LVEDP 28 mm Hg. She was admitted on 02/23/15 with recurrence of chest pain with associated palpitations. Troponin was negative. ECG showed no acute changes. She underwent nuclear stress test on 02/23/15 that showed medium defect of moderate severity present in the mid inferoseptal, mid inferior and apical inferior location. This was consistent with ischemia, EF 54%, intermediate risk study. She underwent cardiac cath on 02/26/15 that showed severe one-vessel CAD with severe ISR in the proximal segment of the previously placed stent in the distal RCA. She underwent successful PCI/DES to the distal RCA for ISR. Normal EF by nuclear stress testing. She was seen in 02/2015 for post-cath follow up and noted nonexertional chest pain lasting 5-10  seconds in duration. She had no exertional symptoms and could go all day running around without any symptoms. She underwent treadmill Myoview on 03/19/15 that was low risk with an EF of 55-65%. She was admitted to [facility] 07/2016 with NSTEMI with peak troponin of 0.12. Echo 07/2016 showed EF 60-65%, no RWMA, Gr1DD, mild AI, left atrium normal in size, RV systolic function normal, PASP normal. LHC 07/2016 showed severe one-vessel CAD affecting the RCA with multiple previous stenting. There was felt to be a plaque rupture within the proximal to mid stent which was the culprit for her unstable angina. She underwent successful PCI/DES placement to the proximal/mid RCA within the previously placed stent. There was 60% disease in the mid to distal segment which was left to be treated medically. She was seen in the office on 02/18/2017 for unstable angina. She underwent LHC 02/19/2017 that showed the left main normal, LAD normal, D1 40%, LCx minor irregs, OM1 60%, proximal to mid RCA 50-60% stenosed with ISR in the proximal region, mid to distal RCA 50%, RPDA 90% filled by collaterals from 3rd septal,LVEF 55-65%. Medical management was advised. Echo 02/24/2017 showed an EF of 55-60%, no RWMA, Gr1DD, RVSF normal, PASP normal. Seen by Dr. Mariah Milling 05/28/2017 with worsening angina. She underwent Lexiscan Myoview on 06/04/17 which showed a large region of inferior wall ischemia as well as inferior lateral wall ischemia, EF 57%, lateral wall hypokinesis, high risk scan. She underwent LHC 06/09/17 that showed severe, single-vessel CAD with focal 95% in-stent restenosis of the mid RCA, where two layers of stent  are already present. There is also moderate stenosis of the mid/distal RCA (unchanged from prior caths), and high grade disease involving the ostial/proximal segment of the PDA (small vessel). Mild to moderate, nonobstructive CAD involving the left coronary artery. Mildly elevated LV filling pressure. She underwent successful PTCA to  in-stent restenosis of the mid RCA with reduction of stenosis from 95% to 0%. Lifelong DAPT was recommended.  Following this, she was evaluated by [facility] in 09/2017 with recommendation to increase Lasix from 20 mg to 40 mg daily. She was also referred to vascular surgery for claudication-like symptoms.   She was last seen by [facility] virtually in 06/2018 and was doing well from a cardiac perspective.   Echo in 07/2018, at [facility], showed an EF > 55%, mild LVH, normal RVSF, dilated ascending aorta, and no significant valvular abnormalities.   ***   Labs independently reviewed: 02/2019 - A1c 7.8, potassium 4.5, BUN 12, SCr 0.76, albumin 3.9, AST/ALT normal, TSH normal 01/2019 - HGB 12.2, PLT 276 05/2017 - TC 156, TG 107, HDL 41, LDL 94  Past Medical History:  Diagnosis Date  . Anxiety   . Arthritis    "qwhere" (06/14/2014)  . Bipolar disorder (HCC)   . CAD (coronary artery disease)    a. 2008 PCI/DES to RCA; b. 2012 PCI: 95% mRCA s/p PCI/DES; b. 2/16 PCI DES-> 90% dRCA; c. 11/16 PCI: dRCA ISR s/p PCI/DES;  d. 4/18 PCI: p-mRCA 95% s/p PCI/DES; e. 02/2017 Cath: LML nl, LAD nl, D1 40, LCX min irregs, OM1 60, RCA 50-60p/m ISR, 53m/d, patent dRCA stent, RPDA 90ost (fills via L->R collats), EF 55-65%.  . Chronic bronchitis (HCC)    "get it q yr"  . Daily headache    "when my blood pressure is up" (06/14/2014)  . Depression   . Diastolic dysfunction    a. 02/2017 Echo: EF 55-60%, no rwma, Gr1 DD, nl RV fxn.  [name] GERD (gastroesophageal reflux disease)   . Heart murmur   . High cholesterol   . History of stomach ulcers   . Hypertension   . Insomnia   . Kidney stones   . Migraine    "haven't had them in a good while; did get them 1-2 times/yr" (06/14/2014)  . Myocardial infarction (HCC) 2008; ~ 2012  . Pneumonia    "get it 1-2 times/year" (06/14/2014)  . RLS (restless legs syndrome)   . Seizures (HCC)   . Sleep apnea    "they want me to do the lab but I haven't" (06/14/2014)  .  Stroke [facility])    "they say I've had several" (06/14/2014)  . Tobacco abuse   . Type II diabetes mellitus (HCC)   . Urinary, incontinence, stress female     Past Surgical History:  Procedure Laterality Date  . ABDOMINAL HYSTERECTOMY  1984   "I've got 1 ovary left"  . CARDIAC CATHETERIZATION N/A 02/26/2015   Procedure: Left Heart Cath and Coronary Angiography;  Surgeon: Iran Ouch, MD;  Location: [facility] INVASIVE CV LAB;  Service: Cardiovascular;  Laterality: N/A;  . CARDIAC CATHETERIZATION N/A 02/26/2015   Procedure: Coronary Stent Intervention;  Surgeon: Iran Ouch, MD;  Location: [facility] INVASIVE CV LAB;  Service: Cardiovascular;  Laterality: N/A;  . CESAREAN SECTION  1984  . CORONARY ANGIOPLASTY WITH STENT PLACEMENT  2008; ~ 2012   "1; 1"  . CORONARY BALLOON ANGIOPLASTY N/A 06/09/2017   Procedure: CORONARY BALLOON ANGIOPLASTY;  Surgeon: Yvonne Kendall, MD;  Location: [facility] INVASIVE CV LAB;  Service: Cardiovascular;  Laterality: N/A;  . CORONARY STENT INTERVENTION N/A 08/04/2016   Procedure: Coronary Stent Intervention;  Surgeon: Wellington Hampshire, MD;  Location: [place] CV LAB;  Service: Cardiovascular;  Laterality: N/A;  . DILATION AND CURETTAGE OF UTERUS    . EXTRACORPOREAL SHOCK WAVE LITHOTRIPSY  X 2  . I & D EXTREMITY Left 02/09/2018   Procedure: IRRIGATION AND DEBRIDEMENT EXTREMITY;  Surgeon: Herbert Pun, MD;  Location: [facility] ORS;  Service: General;  Laterality: Left;  . KNEE ARTHROSCOPY Left   . LEFT HEART CATH Bilateral 08/04/2016   Procedure: Left Heart Cath poss PCI;  Surgeon: Wellington Hampshire, MD;  Location: [place] CV LAB;  Service: Cardiovascular;  Laterality: Bilateral;  . LEFT HEART CATH AND CORONARY ANGIOGRAPHY Left 02/19/2017   Procedure: LEFT HEART CATH AND CORONARY ANGIOGRAPHY;  Surgeon: Minna Merritts, MD;  Location: [place] CV LAB;  Service: Cardiovascular;  Laterality: Left;  . LEFT HEART CATH AND CORONARY ANGIOGRAPHY Left 06/09/2017    Procedure: LEFT HEART CATH AND CORONARY ANGIOGRAPHY;  Surgeon: Nelva Bush, MD;  Location: [place] CV LAB;  Service: Cardiovascular;  Laterality: Left;  . LEFT HEART CATHETERIZATION WITH CORONARY ANGIOGRAM N/A 06/15/2014   Procedure: LEFT HEART CATHETERIZATION WITH CORONARY ANGIOGRAM;  Surgeon: Jettie Booze, MD;  Location: [facility] CATH LAB;  Service: Cardiovascular;  Laterality: N/A;  . PERCUTANEOUS CORONARY STENT INTERVENTION (PCI-S)  06/15/2014   Procedure: PERCUTANEOUS CORONARY STENT INTERVENTION (PCI-S);  Surgeon: Jettie Booze, MD;  Location: [facility] CATH LAB;  Service: Cardiovascular;;  . TONSILLECTOMY    . TUBAL LIGATION      Current Medications: No outpatient medications have been marked as taking for the 08/04/19 encounter (Appointment) with Rise Mu, PA-C.    Allergies:   Carbamazepine, Lithium, Lodine [etodolac], Codeine, Levofloxacin in d5w, Methocarbamol, Tape, and Relafen [nabumetone]   Social History   Socioeconomic History  . Marital status: Legally Separated    Spouse name: Not on file  . Number of children: Not on file  . Years of education: Not on file  . Highest education level: Not on file  Occupational History  . Not on file  Tobacco Use  . Smoking status: Current Every Day Smoker    Packs/day: 0.25    Years: 35.00    Pack years: 8.75    Types: Cigarettes  . Smokeless tobacco: Never Used  . Tobacco comment: Patches  Substance and Sexual Activity  . Alcohol use: Yes    Alcohol/week: 0.0 standard drinks    Comment: occ  . Drug use: Yes    Types: Marijuana  . Sexual activity: Not on file  Other Topics Concern  . Not on file  Social History Narrative  . Not on file   Social Determinants of Health   Financial Resource Strain:   . Difficulty of Paying Living Expenses:   Food Insecurity:   . Worried About Charity fundraiser in the Last Year:   . Arboriculturist in the Last Year:   Transportation Needs:   . Film/video editor  (Medical):   [name] Lack of Transportation (Non-Medical):   Physical Activity:   . Days of Exercise per Week:   . Minutes of Exercise per Session:   Stress:   . Feeling of Stress :   Social Connections:   . Frequency of Communication with Friends and Family:   . Frequency of Social Gatherings with Friends and Family:   . Attends Religious Services:   .  Active Member of Clubs or Organizations:   . Attends Banker Meetings:   [name] Marital Status:      Family History:  The patient's family history includes Cirrhosis in her father; Lung cancer in her mother.  ROS:   ROS   EKGs/Labs/Other Studies Reviewed:    Studies reviewed were summarized above. The additional studies were reviewed today: As above.   EKG:  EKG is ordered today.  The EKG ordered today demonstrates ***  Recent Labs: 07/12/2019: ALT 18; BUN 33; Creatinine, Ser 1.07; Hemoglobin 12.9; Platelets 286; Potassium 4.4; Sodium 136  Recent Lipid Panel    Component Value Date/Time   CHOL 156 06/18/2017 1017   TRIG 107 06/18/2017 1017   HDL 41 06/18/2017 1017   CHOLHDL 3.8 06/18/2017 1017   VLDL 21 06/18/2017 1017   LDLCALC 94 06/18/2017 1017    PHYSICAL EXAM:    VS:  There were no vitals taken for this visit.  BMI: There is no height or weight on file to calculate BMI.  Physical Exam  Wt Readings from Last 3 Encounters:  07/12/19 213 lb (96.6 kg)  07/24/18 205 lb (93 kg)  02/09/18 212 lb (96.2 kg)     ASSESSMENT & PLAN:   1. ***  Disposition: F/u with Dr. Mariah Milling or an APP in ***.   Medication Adjustments/Labs and Tests Ordered: Current medicines are reviewed at length with the patient today.  Concerns regarding medicines are outlined above. Medication changes, Labs and Tests ordered today are summarized above and listed in the Patient Instructions accessible in Encounters.   Signed, Eula Listen, PA-C 08/01/2019 2:48 PM     [facility] [place] [address] [place],  [place] [address] [phone]

## 2019-08-04 ENCOUNTER — Other Ambulatory Visit: Payer: Self-pay | Admitting: Cardiovascular Disease

## 2019-08-04 ENCOUNTER — Ambulatory Visit: Payer: Medicare Other | Admitting: Physician Assistant

## 2019-08-19 NOTE — Progress Notes (Deleted)
Cardiology Office Note    Date:  08/19/2019   ID:  Jennifer [name], DOB May 22, 1960, MRN [id number]  PCP:  Charleen Kirks, MD  Cardiologist:  Julien Nordmann, MD  Electrophysiologist:  None   Chief Complaint: Follow-up  History of Present Illness:   Jennifer [name] is a 59 y.o. 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   She previously underwent stenting to the RCA in 2008 followed by recurrence of chest pain in 2010, with an abnormal stress test that showed prior inferior infarct with peri-infarct ischemia. Cardiac cath in 2010, showed 95% stenosis in the mid RCA, diffuse 60% stenosis in the proximal RDPA. She underwent successful PCI/DES of the mid RCA. She redeevloped chest pain in 05/2014. Troponin was negative. Cardiac cath 06/15/2014, showed widely patent but small left main, mild disease in the LAD and its branches, mild to moderate disease in the LCx and its branches, severe 90% stenosis in the distal RCA s/p PCI/DES, normal LVSF, LVEDP 28 mm Hg. She was admitted on 02/23/15 with recurrence of chest pain with associated palpitations. Troponin was negative. ECG showed no acute changes. She underwent nuclear stress test on 02/23/15 that showed medium defect of moderate severity present in the mid inferoseptal, mid inferior and apical inferior location. This was consistent with ischemia, EF 54%, intermediate risk study. She underwent cardiac cath on 02/26/15 that showed severe one-vessel CAD with severe ISR in the proximal segment of the previously placed stent in the distal RCA. She underwent successful PCI/DES to the distal RCA for ISR. Normal EF by nuclear stress testing. She was seen in 02/2015 for post-cath follow up and noted nonexertional chest pain  lasting 5-10 seconds in duration. She had no exertional symptoms and could go all day running around without any symptoms. She underwent treadmill Myoview on 03/19/15 that was low risk with an EF of 55-65%. She was admitted to [facility] 07/2016 with NSTEMI with peak troponin of 0.12. Echo 07/2016 showed EF 60-65%, no RWMA, Gr1DD, mild AI, left atrium normal in size, RV systolic function normal, PASP normal. LHC 07/2016 showed severe one-vessel CAD affecting the RCA with multiple previous stenting. There was felt to be a plaque rupture within the proximal to mid stent which was the culprit for her unstable angina. She underwent successful PCI/DES placement to the proximal/mid RCA within the previously placed stent. There was 60% disease in the mid to distal segment which was left to be treated medically. She was seen in the office on 02/18/2017 for unstable angina. She underwent LHC 02/19/2017 that showed the left main normal, LAD normal, D1 40%, LCx minor irregs, OM1 60%, proximal to mid RCA 50-60% stenosed with ISR in the proximal region, mid to distal RCA 50%, RPDA 90% filled by collaterals from 3rd septal,LVEF 55-65%. Medical management was advised. Echo 02/24/2017 showed an EF of 55-60%, no RWMA, Gr1DD, RVSF normal, PASP normal. Seen by Dr. Mariah Milling 05/28/2017 with worsening angina. She underwent Lexiscan Myoview on 06/04/17 which showed a large region of inferior wall ischemia as well as inferior lateral wall ischemia, EF 57%, lateral wall hypokinesis, high risk scan. She underwent LHC 06/09/17 that showed severe, single-vessel CAD with focal 95% in-stent restenosis of the mid RCA, where two layers of stent  are already present. There is also moderate stenosis of the mid/distal RCA (unchanged from prior caths), and high grade disease involving the ostial/proximal segment of the PDA (small vessel). Mild to moderate, nonobstructive CAD involving the left coronary artery. Mildly elevated LV filling pressure. She underwent  successful PTCA to in-stent restenosis of the mid RCA with reduction of stenosis from 95% to 0%. Lifelong DAPT was recommended.  Following this, she was evaluated by [facility] in 09/2017 with recommendation to increase Lasix from 20 mg to 40 mg daily. She was also referred to vascular surgery for claudication-like symptoms.   She was last seen by [facility] virtually in 06/2018 and was doing well from a cardiac perspective.   Echo in 07/2018, at [facility], showed an EF > 55%, mild LVH, normal RVSF, dilated ascending aorta, and no significant valvular abnormalities.  ***   Labs independently reviewed: 06/2019 - Hgb 12.9, PLT 286, potassium 4.4, BUN 33, serum creatinine 1.07, albumin 3.5, AST/ALT normal 02/2019 - A1c 7.8, TSH normal 05/2017 - TC 156, TG 107, HDL 41, LDL 94  Past Medical History:  Diagnosis Date  . Anxiety   . Arthritis    "qwhere" (06/14/2014)  . Bipolar disorder (HCC)   . CAD (coronary artery disease)    a. 2008 PCI/DES to RCA; b. 2012 PCI: 95% mRCA s/p PCI/DES; b. 2/16 PCI DES-> 90% dRCA; c. 11/16 PCI: dRCA ISR s/p PCI/DES;  d. 4/18 PCI: p-mRCA 95% s/p PCI/DES; e. 02/2017 Cath: LML nl, LAD nl, D1 40, LCX min irregs, OM1 60, RCA 50-60p/m ISR, 58m/d, patent dRCA stent, RPDA 90ost (fills via L->R collats), EF 55-65%.  . Chronic bronchitis (HCC)    "get it q yr"  . Daily headache    "when my blood pressure is up" (06/14/2014)  . Depression   . Diastolic dysfunction    a. 02/2017 Echo: EF 55-60%, no rwma, Gr1 DD, nl RV fxn.  [name] GERD (gastroesophageal reflux disease)   . Heart murmur   . High cholesterol   . History of stomach ulcers   . Hypertension   . Insomnia   . Kidney stones   . Migraine    "haven't had them in a good while; did get them 1-2 times/yr" (06/14/2014)  . Myocardial infarction (HCC) 2008; ~ 2012  . Pneumonia    "get it 1-2 times/year" (06/14/2014)  . RLS (restless legs syndrome)   . Seizures (HCC)   . Sleep apnea    "they want me to do the lab but  I haven't" (06/14/2014)  . Stroke [facility])    "they say I've had several" (06/14/2014)  . Tobacco abuse   . Type II diabetes mellitus (HCC)   . Urinary, incontinence, stress female     Past Surgical History:  Procedure Laterality Date  . ABDOMINAL HYSTERECTOMY  1984   "I've got 1 ovary left"  . CARDIAC CATHETERIZATION N/A 02/26/2015   Procedure: Left Heart Cath and Coronary Angiography;  Surgeon: Iran Ouch, MD;  Location: [facility] INVASIVE CV LAB;  Service: Cardiovascular;  Laterality: N/A;  . CARDIAC CATHETERIZATION N/A 02/26/2015   Procedure: Coronary Stent Intervention;  Surgeon: Iran Ouch, MD;  Location: [facility] INVASIVE CV LAB;  Service: Cardiovascular;  Laterality: N/A;  . CESAREAN SECTION  1984  . CORONARY ANGIOPLASTY WITH STENT PLACEMENT  2008; ~ 2012   "1; 1"  . CORONARY BALLOON ANGIOPLASTY N/A 06/09/2017   Procedure: CORONARY BALLOON ANGIOPLASTY;  Surgeon: Yvonne Kendall, MD;  Location: [facility] INVASIVE CV LAB;  Service: Cardiovascular;  Laterality: N/A;  . CORONARY STENT INTERVENTION N/A 08/04/2016   Procedure: Coronary Stent Intervention;  Surgeon: Wellington Hampshire, MD;  Location: [place] CV LAB;  Service: Cardiovascular;  Laterality: N/A;  . DILATION AND CURETTAGE OF UTERUS    . EXTRACORPOREAL SHOCK WAVE LITHOTRIPSY  X 2  . I & D EXTREMITY Left 02/09/2018   Procedure: IRRIGATION AND DEBRIDEMENT EXTREMITY;  Surgeon: Herbert Pun, MD;  Location: [facility] ORS;  Service: General;  Laterality: Left;  . KNEE ARTHROSCOPY Left   . LEFT HEART CATH Bilateral 08/04/2016   Procedure: Left Heart Cath poss PCI;  Surgeon: Wellington Hampshire, MD;  Location: [place] CV LAB;  Service: Cardiovascular;  Laterality: Bilateral;  . LEFT HEART CATH AND CORONARY ANGIOGRAPHY Left 02/19/2017   Procedure: LEFT HEART CATH AND CORONARY ANGIOGRAPHY;  Surgeon: Minna Merritts, MD;  Location: [place] CV LAB;  Service: Cardiovascular;  Laterality: Left;  . LEFT HEART CATH AND CORONARY  ANGIOGRAPHY Left 06/09/2017   Procedure: LEFT HEART CATH AND CORONARY ANGIOGRAPHY;  Surgeon: Nelva Bush, MD;  Location: [place] CV LAB;  Service: Cardiovascular;  Laterality: Left;  . LEFT HEART CATHETERIZATION WITH CORONARY ANGIOGRAM N/A 06/15/2014   Procedure: LEFT HEART CATHETERIZATION WITH CORONARY ANGIOGRAM;  Surgeon: Jettie Booze, MD;  Location: [facility] CATH LAB;  Service: Cardiovascular;  Laterality: N/A;  . PERCUTANEOUS CORONARY STENT INTERVENTION (PCI-S)  06/15/2014   Procedure: PERCUTANEOUS CORONARY STENT INTERVENTION (PCI-S);  Surgeon: Jettie Booze, MD;  Location: [facility] CATH LAB;  Service: Cardiovascular;;  . TONSILLECTOMY    . TUBAL LIGATION      Current Medications: No outpatient medications have been marked as taking for the 08/25/19 encounter (Appointment) with Rise Mu, PA-C.    Allergies:   Carbamazepine, Lithium, Lodine [etodolac], Codeine, Levofloxacin in d5w, Methocarbamol, Tape, and Relafen [nabumetone]   Social History   Socioeconomic History  . Marital status: Legally Separated    Spouse name: Not on file  . Number of children: Not on file  . Years of education: Not on file  . Highest education level: Not on file  Occupational History  . Not on file  Tobacco Use  . Smoking status: Current Every Day Smoker    Packs/day: 0.25    Years: 35.00    Pack years: 8.75    Types: Cigarettes  . Smokeless tobacco: Never Used  . Tobacco comment: Patches  Substance and Sexual Activity  . Alcohol use: Yes    Alcohol/week: 0.0 standard drinks    Comment: occ  . Drug use: Yes    Types: Marijuana  . Sexual activity: Not on file  Other Topics Concern  . Not on file  Social History Narrative  . Not on file   Social Determinants of Health   Financial Resource Strain:   . Difficulty of Paying Living Expenses:   Food Insecurity:   . Worried About Charity fundraiser in the Last Year:   . Arboriculturist in the Last Year:   Transportation Needs:     . Film/video editor (Medical):   [name] Lack of Transportation (Non-Medical):   Physical Activity:   . Days of Exercise per Week:   . Minutes of Exercise per Session:   Stress:   . Feeling of Stress :   Social Connections:   . Frequency of Communication with Friends and Family:   . Frequency of Social Gatherings with Friends and Family:   . Attends Religious Services:   .  Active Member of Clubs or Organizations:   . Attends Banker Meetings:   [name] Marital Status:      Family History:  The patient's family history includes Cirrhosis in her father; Lung cancer in her mother.  ROS:   ROS   EKGs/Labs/Other Studies Reviewed:    Studies reviewed were summarized above. The additional studies were reviewed today: As above.   EKG:  EKG is ordered today.  The EKG ordered today demonstrates ***  Recent Labs: 07/12/2019: ALT 18; BUN 33; Creatinine, Ser 1.07; Hemoglobin 12.9; Platelets 286; Potassium 4.4; Sodium 136  Recent Lipid Panel    Component Value Date/Time   CHOL 156 06/18/2017 1017   TRIG 107 06/18/2017 1017   HDL 41 06/18/2017 1017   CHOLHDL 3.8 06/18/2017 1017   VLDL 21 06/18/2017 1017   LDLCALC 94 06/18/2017 1017    PHYSICAL EXAM:    VS:  There were no vitals taken for this visit.  BMI: There is no height or weight on file to calculate BMI.  Physical Exam  Wt Readings from Last 3 Encounters:  07/12/19 213 lb (96.6 kg)  07/24/18 205 lb (93 kg)  02/09/18 212 lb (96.2 kg)     ASSESSMENT & PLAN:   1. ***  Disposition: F/u with Dr. Mariah Milling or an APP in ***.   Medication Adjustments/Labs and Tests Ordered: Current medicines are reviewed at length with the patient today.  Concerns regarding medicines are outlined above. Medication changes, Labs and Tests ordered today are summarized above and listed in the Patient Instructions accessible in Encounters.   Signed, Eula Listen, PA-C 08/19/2019 11:00 AM     [facility] [place] [address] [place], [place] [address] [phone]

## 2019-08-25 ENCOUNTER — Ambulatory Visit: Payer: Medicare Other | Admitting: Physician Assistant

## 2019-08-26 ENCOUNTER — Encounter: Payer: Self-pay | Admitting: Physician Assistant

## 2019-09-01 ENCOUNTER — Other Ambulatory Visit: Payer: Self-pay | Admitting: Cardiovascular Disease

## 2019-09-01 ENCOUNTER — Other Ambulatory Visit: Payer: Self-pay

## 2019-09-01 MED ORDER — ATORVASTATIN CALCIUM 80 MG PO TABS: ORAL_TABLET | ORAL | 0 refills | Status: DC

## 2019-09-01 MED ORDER — ATORVASTATIN CALCIUM 80 MG PO TABS: 80.0000 mg | ORAL_TABLET | Freq: Every day | ORAL | 0 refills | Status: DC

## 2019-09-01 NOTE — Telephone Encounter (Signed)
Pt overdue for 12 month f/u hasn't been seen since 06/2018. Pt needs future appointment Hx of no show requesting 90 day refill.

## 2019-09-02 NOTE — Telephone Encounter (Signed)
Called but vm is full

## 2019-09-06 ENCOUNTER — Ambulatory Visit: Payer: Medicare Other | Admitting: Family

## 2019-09-06 NOTE — Progress Notes (Deleted)
Office Visit    Patient Name: Jennifer [name] Date of Encounter: 09/06/2019  Primary Care Provider:  Jeronimo Greaves, MD Primary Cardiologist:  [name], MD Electrophysiologist:  None   Chief Complaint    Jennifer [name] is a 59 y.o. female with a hx of CAD s/p stent to mid RCA 2008 and repeat stent to mid RCA 2012, DM2, HTN, GERD, tobacco use, COPD, HLD, obesity, grade 1 diastolic dysfunction presents today for follow-up of CAD  Past Medical History    Past Medical History:  Diagnosis Date  . Anxiety   . Arthritis    "qwhere" (06/14/2014)  . Bipolar disorder ([place])   . CAD (coronary artery disease)    a. 2008 PCI/DES to RCA; b. 2012 PCI: 95% mRCA s/p PCI/DES; b. 2/16 PCI DES-> 90% dRCA; c. 11/16 PCI: dRCA ISR s/p PCI/DES;  d. 4/18 PCI: p-mRCA 95% s/p PCI/DES; e. 02/2017 Cath: LML nl, LAD nl, D1 40, LCX min irregs, OM1 60, RCA 50-60p/m ISR, 15m/d, patent dRCA stent, RPDA 90ost (fills via L->R collats), EF 55-65%.  . Chronic bronchitis ([place])    "get it q yr"  . Daily headache    "when my blood pressure is up" (06/14/2014)  . Depression   . Diastolic dysfunction    a. 02/2017 Echo: EF 55-60%, no rwma, Gr1 DD, nl RV fxn.  [name] GERD (gastroesophageal reflux disease)   . Heart murmur   . High cholesterol   . History of stomach ulcers   . Hypertension   . Insomnia   . Kidney stones   . Migraine    "haven't had them in a good while; did get them 1-2 times/yr" (06/14/2014)  . Myocardial infarction ([place]) 2008; ~ 2012  . Pneumonia    "get it 1-2 times/year" (06/14/2014)  . RLS (restless legs syndrome)   . Seizures ([place])   . Sleep apnea    "they want me to do the lab but I haven't" (06/14/2014)  . Stroke [facility])    "they say I've had several" (06/14/2014)  . Tobacco abuse   . Type II diabetes mellitus ([place])   . Urinary, incontinence, stress female    Past Surgical History:  Procedure Laterality Date  . ABDOMINAL HYSTERECTOMY  1984   "I've got 1 ovary left"  . CARDIAC  CATHETERIZATION N/A 02/26/2015   Procedure: Left Heart Cath and Coronary Angiography;  Surgeon: Wellington Hampshire, MD;  Location: [place] [facility];  Service: Cardiovascular;  Laterality: N/A;  . CARDIAC CATHETERIZATION N/A 02/26/2015   Procedure: Coronary Stent Intervention;  Surgeon: Wellington Hampshire, MD;  Location: [place] [facility] LAB;  Service: Cardiovascular;  Laterality: N/A;  . CESAREAN SECTION  1984  . CORONARY ANGIOPLASTY WITH STENT PLACEMENT  2008; ~ 2012   "1; 1"  . CORONARY BALLOON ANGIOPLASTY N/A 06/09/2017   Procedure: CORONARY BALLOON ANGIOPLASTY;  Surgeon: Nelva Bush, MD;  Location: [place] [facility];  Service: Cardiovascular;  Laterality: N/A;  . CORONARY STENT INTERVENTION N/A 08/04/2016   Procedure: Coronary Stent Intervention;  Surgeon: Wellington Hampshire, MD;  Location: [place] CV LAB;  Service: Cardiovascular;  Laterality: N/A;  . DILATION AND CURETTAGE OF UTERUS    . EXTRACORPOREAL SHOCK WAVE LITHOTRIPSY  X 2  . I & D EXTREMITY Left 02/09/2018   Procedure: IRRIGATION AND DEBRIDEMENT EXTREMITY;  Surgeon: Herbert Pun, MD;  Location: [facility] ORS;  Service: General;  Laterality: Left;  . KNEE ARTHROSCOPY Left   . LEFT HEART  CATH Bilateral 08/04/2016   Procedure: Left Heart Cath poss PCI;  Surgeon: Iran Ouch, MD;  Location: [facility] INVASIVE CV LAB;  Service: Cardiovascular;  Laterality: Bilateral;  . LEFT HEART CATH AND CORONARY ANGIOGRAPHY Left 02/19/2017   Procedure: LEFT HEART CATH AND CORONARY ANGIOGRAPHY;  Surgeon: Antonieta Iba, MD;  Location: [facility];  Service: Cardiovascular;  Laterality: Left;  . LEFT HEART CATH AND CORONARY ANGIOGRAPHY Left 06/09/2017   Procedure: LEFT HEART CATH AND CORONARY ANGIOGRAPHY;  Surgeon: Yvonne Kendall, MD;  Location: [facility];  Service: Cardiovascular;  Laterality: Left;  . LEFT HEART CATHETERIZATION WITH CORONARY ANGIOGRAM N/A 06/15/2014   Procedure: LEFT HEART CATHETERIZATION WITH  CORONARY ANGIOGRAM;  Surgeon: Corky Crafts, MD;  Location: [facility] CATH LAB;  Service: Cardiovascular;  Laterality: N/A;  . PERCUTANEOUS CORONARY STENT INTERVENTION (PCI-S)  06/15/2014   Procedure: PERCUTANEOUS CORONARY STENT INTERVENTION (PCI-S);  Surgeon: Corky Crafts, MD;  Location[facility] LAB;  Service: Cardiovascular;;  . TONSILLECTOMY    . TUBAL LIGATION      Allergies  Allergies  Allergen Reactions  . Carbamazepine Anaphylaxis and Other (See Comments)    Blood dyscrasia when on with Lithium  . Lithium Other (See Comments)    Blood dyscrasia  . Lodine [Etodolac] Shortness Of Breath, Diarrhea, Nausea And Vomiting and Rash  . Codeine Other (See Comments)    Upset stomach  . Levofloxacin In D5w Swelling and Other (See Comments)    "Salty" sensation in mouth. "Hard time to explain it"  . Methocarbamol Other (See Comments)    Unknown  . Tape Other (See Comments)    Welps, blister on skin  . Relafen [Nabumetone] Diarrhea, Nausea And Vomiting and Rash    History of Present Illness    Jennifer [name] is a 59 y.o. female with a hx of CAD s/p stent to mid RCA 2008 and repeat stent to mid RCA 2012 and 2018, DM2, HTN, GERD, tobacco use, COPD, HLD, obesity, grade 1 diastolic dysfunction.  She was last seen 07/20/2018 via telemedicine by Dr. Mariah Milling.  Symptomatically in 2008 and repeat sending to the mid RCA 2012.  Catheterization February 2016 with severe distal RCA disease estimated 90%, patent mid RCA stent, 70% mid to distal circumflex disease of small vessel.  Cardiac catheterization April 2018 in-stent restenosis of RCA required DES.  Cardiac catheterization November 2018 with recommendation for medical management.  Echocardiogram November 2018 with LVEF 55 to 60%, normal wall motion, grade 1 diastolic dysfunction, no significant valvular abnormalities.  ***  EKGs/Labs/Other Studies Reviewed:   The following studies were reviewed today: ***  EKG:  EKG is ordered today.   The ekg ordered today demonstrates ***  Recent Labs: 07/12/2019: ALT 18; BUN 33; Creatinine, Ser 1.07; Hemoglobin 12.9; Platelets 286; Potassium 4.4; Sodium 136  Recent Lipid Panel    Component Value Date/Time   CHOL 156 06/18/2017 1017   TRIG 107 06/18/2017 1017   HDL 41 06/18/2017 1017   CHOLHDL 3.8 06/18/2017 1017   VLDL 21 06/18/2017 1017   LDLCALC 94 06/18/2017 1017    Home Medications   No outpatient medications have been marked as taking for the 09/06/19 encounter (Appointment) with Alver Sorrow, NP.      Review of Systems    ***   ROS All other systems reviewed and are otherwise negative except as noted above.  Physical Exam    VS:  There were no vitals taken for this visit. , BMI  There is no height or weight on file to calculate BMI. GEN: Well nourished, well developed, in no acute distress. HEENT: normal. Neck: Supple, no JVD, carotid bruits, or masses. Cardiac: ***RRR, no murmurs, rubs, or gallops. No clubbing, cyanosis, edema.  ***Radials/DP/PT 2+ and equal bilaterally.  Respiratory: ***respirations regular and unlabored, clear to auscultation bilaterally. GI: Soft, nontender, nondistended, BS + x 4. MS: No deformity or atrophy. Skin: Warm and dry, no rash. Neuro:  Strength and sensation are intact. Psych: Normal affect.  Accessory Clinical Findings    ECG personally reviewed by me today - *** - no acute changes.  Assessment & Plan    1. CAD -  2. HLD, LDL goal less than 70 - 3. HTN -  4. COPD -  5. Tobacco abuse - Smoking cessation encouraged. Recommend utilization of 1800QUITNOW. 6. DM2 -   Disposition: Follow up {follow up:15908} with ***   Alver Sorrow, NP 09/06/2019, 8:00 AM

## 2019-09-07 ENCOUNTER — Encounter: Payer: Self-pay | Admitting: Family

## 2019-09-29 ENCOUNTER — Ambulatory Visit: Payer: Medicare Other | Admitting: Family

## 2019-09-29 NOTE — Progress Notes (Deleted)
Office Visit    Patient Name: Jennifer [name] Date of Encounter: 09/29/2019  Primary Care Provider:  Charleen Kirks, MD Primary Cardiologist:  Julien Nordmann, MD Electrophysiologist:  None   Chief Complaint    Jennifer [name] is a 59 y.o. female with a hx of CAD s/p stent to mid RCA 2008 and repeat stent to mid RCA 2012, DM2, HTN, GERD, tobacco use, COPD, HLD, obesity, grade 1 diastolic dysfunction presents today for follow-up of CAD  Past Medical History    Past Medical History:  Diagnosis Date  . Anxiety   . Arthritis    "qwhere" (06/14/2014)  . Bipolar disorder ([facility])   . CAD (coronary artery disease)    a. 2008 PCI/DES to RCA; b. 2012 PCI: 95% mRCA s/p PCI/DES; b. 2/16 PCI DES-> 90% dRCA; c. 11/16 PCI: dRCA ISR s/p PCI/DES;  d. 4/18 PCI: p-mRCA 95% s/p PCI/DES; e. 02/2017 Cath: LML nl, LAD nl, D1 40, LCX min irregs, OM1 60, RCA 50-60p/m ISR, 49m/d, patent dRCA stent, RPDA 90ost (fills via L->R collats), EF 55-65%.  . Chronic bronchitis ([facility])    "get it q yr"  . Daily headache    "when my blood pressure is up" (06/14/2014)  . Depression   . Diastolic dysfunction    a. 02/2017 Echo: EF 55-60%, no rwma, Gr1 DD, nl RV fxn.  [name] GERD (gastroesophageal reflux disease)   . Heart murmur   . High cholesterol   . History of stomach ulcers   . Hypertension   . Insomnia   . Kidney stones   . Migraine    "haven't had them in a good while; did get them 1-2 times/yr" (06/14/2014)  . Myocardial infarction ([facility]) 2008; ~ 2012  . Pneumonia    "get it 1-2 times/year" (06/14/2014)  . RLS (restless legs syndrome)   . Seizures ([facility])   . Sleep apnea    "they want me to do the lab but I haven't" (06/14/2014)  . Stroke [facility])    "they say I've had several" (06/14/2014)  . Tobacco abuse   . Type II diabetes mellitus ([facility])   . Urinary, incontinence, stress female    Past Surgical History:  Procedure Laterality Date  . ABDOMINAL HYSTERECTOMY  1984   "I've got 1 ovary left"  . CARDIAC  CATHETERIZATION N/A 02/26/2015   Procedure: Left Heart Cath and Coronary Angiography;  Surgeon: Iran Ouch, MD;  Location: [facility];  Service: Cardiovascular;  Laterality: N/A;  . CARDIAC CATHETERIZATION N/A 02/26/2015   Procedure: Coronary Stent Intervention;  Surgeon: Iran Ouch, MD;  Location: [facility];  Service: Cardiovascular;  Laterality: N/A;  . CESAREAN SECTION  1984  . CORONARY ANGIOPLASTY WITH STENT PLACEMENT  2008; ~ 2012   "1; 1"  . CORONARY BALLOON ANGIOPLASTY N/A 06/09/2017   Procedure: CORONARY BALLOON ANGIOPLASTY;  Surgeon: Yvonne Kendall, MD;  Location: [facility];  Service: Cardiovascular;  Laterality: N/A;  . CORONARY STENT INTERVENTION N/A 08/04/2016   Procedure: Coronary Stent Intervention;  Surgeon: Iran Ouch, MD;  Location: [facility];  Service: Cardiovascular;  Laterality: N/A;  . DILATION AND CURETTAGE OF UTERUS    . EXTRACORPOREAL SHOCK WAVE LITHOTRIPSY  X 2  . I & D EXTREMITY Left 02/09/2018   Procedure: IRRIGATION AND DEBRIDEMENT EXTREMITY;  Surgeon: Carolan Shiver, MD;  Location: [facility] OR[facility];  Service: General;  Laterality: Left;  . KNEE ARTHROSCOPY Left   . LEFT HEART  CATH Bilateral 08/04/2016   Procedure: Left Heart Cath poss PCI;  Surgeon: Wellington Hampshire, MD;  Location: [place] [facility];  Service: Cardiovascular;  Laterality: Bilateral;  . LEFT HEART CATH AND CORONARY ANGIOGRAPHY Left 02/19/2017   Procedure: LEFT HEART CATH AND CORONARY ANGIOGRAPHY;  Surgeon: Minna Merritts, MD;  Location: [place] [facility];  Service: Cardiovascular;  Laterality: Left;  . LEFT HEART CATH AND CORONARY ANGIOGRAPHY Left 06/09/2017   Procedure: LEFT HEART CATH AND CORONARY ANGIOGRAPHY;  Surgeon: Nelva Bush, MD;  Location: [place] [facility];  Service: Cardiovascular;  Laterality: Left;  . LEFT HEART CATHETERIZATION WITH CORONARY ANGIOGRAM N/A 06/15/2014   Procedure: LEFT HEART CATHETERIZATION WITH  CORONARY ANGIOGRAM;  Surgeon: Jettie Booze, MD;  Location: [facility] CATH LAB;  Service: Cardiovascular;  Laterality: N/A;  . PERCUTANEOUS CORONARY STENT INTERVENTION (PCI-S)  06/15/2014   Procedure: PERCUTANEOUS CORONARY STENT INTERVENTION (PCI-S);  Surgeon: Jettie Booze, MD;  Location: [facility] CATH LAB;  Service: Cardiovascular;;  . TONSILLECTOMY    . TUBAL LIGATION      Allergies  Allergies  Allergen Reactions  . Carbamazepine Anaphylaxis and Other (See Comments)    Blood dyscrasia when on with Lithium  . Lithium Other (See Comments)    Blood dyscrasia  . Lodine [Etodolac] Shortness Of Breath, Diarrhea, Nausea And Vomiting and Rash  . Codeine Other (See Comments)    Upset stomach  . Levofloxacin In D5w Swelling and Other (See Comments)    "Salty" sensation in mouth. "Hard time to explain it"  . Methocarbamol Other (See Comments)    Unknown  . Tape Other (See Comments)    Welps, blister on skin  . Relafen [Nabumetone] Diarrhea, Nausea And Vomiting and Rash    History of Present Illness    Jennifer [name] is a 59 y.o. female with a hx of CAD s/p stent to mid RCA 2008 and repeat stent to mid RCA 2012 and 2018, DM2, HTN, GERD, tobacco use, COPD, HLD, obesity, grade 1 diastolic dysfunction.  She was last seen 07/20/2018 via telemedicine by Dr. Rockey Situ.  DES in 2008 and repeat stenting to the mid RCA 2012.  Catheterization February 2016 with severe distal RCA disease estimated 90%, patent mid RCA stent, 70% mid to distal circumflex disease of small vessel.  Cardiac catheterization April 2018 in-stent restenosis of RCA required DES.  Cardiac catheterization November 2018 with recommendation for medical management.  Echocardiogram November 2018 with LVEF 55 to 60%, normal wall motion, grade 1 diastolic dysfunction, no significant valvular abnormalities.  ***  EKGs/Labs/Other Studies Reviewed:   The following studies were reviewed today: ***  EKG:  EKG is ordered today.  The ekg  ordered today demonstrates ***  Recent Labs: 07/12/2019: ALT 18; BUN 33; Creatinine, Ser 1.07; Hemoglobin 12.9; Platelets 286; Potassium 4.4; Sodium 136  Recent Lipid Panel    Component Value Date/Time   CHOL 156 06/18/2017 1017   TRIG 107 06/18/2017 1017   HDL 41 06/18/2017 1017   CHOLHDL 3.8 06/18/2017 1017   VLDL 21 06/18/2017 1017   LDLCALC 94 06/18/2017 1017    Home Medications   No outpatient medications have been marked as taking for the 09/29/19 encounter (Appointment) with Loel Dubonnet, NP.      Review of Systems    ***   ROS All other systems reviewed and are otherwise negative except as noted above.  Physical Exam    VS:  There were no vitals taken for this visit. , BMI  There is no height or weight on file to calculate BMI. GEN: Well nourished, well developed, in no acute distress. HEENT: normal. Neck: Supple, no JVD, carotid bruits, or masses. Cardiac: ***RRR, no murmurs, rubs, or gallops. No clubbing, cyanosis, edema.  ***Radials/DP/PT 2+ and equal bilaterally.  Respiratory: ***respirations regular and unlabored, clear to auscultation bilaterally. GI: Soft, nontender, nondistended, BS + x 4. MS: No deformity or atrophy. Skin: Warm and dry, no rash. Neuro:  Strength and sensation are intact. Psych: Normal affect.  Assessment & Plan    1. CAD -  2. HLD, LDL goal less than 70 - 3. HTN -  4. COPD -  5. Tobacco abuse - Smoking cessation encouraged. Recommend utilization of 1800QUITNOW. 6. DM2 -   Disposition: Follow up {follow up:15908} with ***   Alver Sorrow, NP 09/29/2019, 1:24 PM

## 2019-09-30 ENCOUNTER — Encounter: Payer: Self-pay | Admitting: Family

## 2019-10-07 NOTE — Telephone Encounter (Signed)
No show for 09/29/19 appt .  Mailed letter .  Close this encounter.

## 2019-11-20 ENCOUNTER — Other Ambulatory Visit: Payer: Self-pay | Admitting: Cardiovascular Disease

## 2019-11-25 ENCOUNTER — Other Ambulatory Visit: Payer: Self-pay | Admitting: Cardiovascular Disease

## 2019-11-30 ENCOUNTER — Other Ambulatory Visit: Payer: Self-pay | Admitting: Cardiovascular Disease

## 2019-11-30 NOTE — Telephone Encounter (Signed)
Please contact pt for future appointment pt overdue for 6 month f/u. °

## 2019-11-30 NOTE — Telephone Encounter (Signed)
Please schedule overdue appointment with Dr. Gollan. Thank you! 

## 2019-12-05 NOTE — Progress Notes (Signed)
Cardiology Office Note    Date:  12/09/2019   ID:  Jennifer, [name] 1960-06-05, MRN [id number]  PCP:  Jennifer Greaves, MD  Cardiologist:  [name], MD  Electrophysiologist:  None   Chief Complaint: Follow up  History of Present Illness:   Jennifer [name] is a 59 y.o. female with history of CAD s/p multiple prior stents described below, diastolic dysfunction, DM2, HTN, HLD, sleep apnea not on CPAP, ongoing tobacco abuse - smoking 1/4 pack of cigarettes daily, PAD, chronic pain, obesity, recurrent nephrolithiasis, anxiety, and depression who presents for follow-up of her CAD.   She previously underwent stenting to the RCA in 2008 followed by recurrence of chest pain in 2010, leading to an abnormal stress test that showed prior inferior infarct with peri-infarct ischemia. Cardiac cath in 2010 showed 95% stenosis in the mid RCA, diffuse 60% stenosis in the proximal RDPA. She underwent successful PCI/DES of the mid RCA. She redeevloped chest pain in 05/2014 and was transferred [facility] to [facility]. Troponin was negative. Cardiac cath 06/15/2014, showed widely patent but small left main, mild disease in the LAD and its branches, mild to moderate disease in the LCx and its branches, severe 90% stenosis in the distal RCA s/p PCI/DES, normal LVSF, LVEDP 28 mm Hg. She was admitted on 02/23/15 with recurrence of chest pain with associated palpitations. Troponin was negative. ECG showed no acute changes. She underwent nuclear stress test on 02/23/15 that showed medium defect of moderate severity present in the mid inferoseptal, mid inferior and apical inferior location. This was consistent with ischemia, EF 54%, intermediate risk study. She underwent cardiac cath on 02/26/15 that showed severe one-vessel CAD with severe ISR in the proximal segment of the previously placed stent in the distal RCA. She underwent successful PCI/DES to the distal RCA for ISR. Mildly elevated LVEDP. Normal EF by nuclear stress  testing. She was seen in 02/2015 for post-cath follow up and noted nonexertional chest pain lasting 5-10 seconds in duration. She had no exertional symptoms and could go all day running around without any symptoms. She underwent treadmill Myoview on 03/19/15 that was low risk with an EF of 55-65%. She was admitted to [facility] 07/2016 with unstable angina/NSTEMI with peak troponin of 0.12. Echo 07/2016 showed EF 60-65%, no RWMA, Gr1DD, mild AI, left atrium normal in size, RV systolic function normal, PASP normal. LHC 07/2016 showed severe one-vessel CAD affecting the RCA with multiple previous stenting. There was felt to be a plaque rupture within the proximal to mid stent which was the culprit for her unstable angina. She underwent successful PCI/DES placement to the proximal/mid RCA within the previously placed stent. There was 60% disease in the mid to distal segment which was left to be treated medically. She was seen in the office on 02/18/2017 for unstable angina. She underwent LHC 02/19/2017 that showed the left main normal, LAD normal, D1 40%, LCx minor irregs, OM1 60%, proximal to mid RCA 50-60% stenosed with ISR in the proximal region, mid to distal RCA 50%, RPDA 90% filled by collaterals from 3rd septal,LVEF 55-65%. Medical management was advised. Echo 02/24/2017 showed an EF of 55-60%, no RWMA, Gr1DD, RVSF normal, PASP normal.  She underwent Lexiscan Myoview on 06/04/17 which showed a large region of inferior wall ischemia as well as inferior lateral wall ischemia, EF 57%, lateral wall hypokinesis, high risk scan. She underwent LHC 06/09/17 that showed severe, single-vessel CAD with focal 95% in-stent restenosis of the mid RCA, where  two layers of stent are already present. There was also moderate stenosis of the mid/distal RCA (unchanged from prior caths), and high grade disease involving the ostial/proximal segment of the PDA (small vessel). Mild to moderate, nonobstructive CAD involving the left coronary artery.  Mildly elevated LV filling pressure. She underwent successful PTCA to in-stent restenosis of the mid RCA with reduction of stenosis from 95% to 0%. Lifelong DAPT was recommended.  She was last seen by our office virtually in 06/2018 and was doing well from a cardiac perspective.  Since this visit, she underwent echo at [facility] in 07/2018, for dyspnea, which showed an EF of >55%, mild LVH, normal RVSF, no significant valvular abnormalities, and a dilated ascending aorta.   She comes in doing well from a cardiac perspective.  Since she was last seen she has been under a significant amount of stress at home.  She has been evicted from her home.  She had issues with DSS regarding her children and her home situation.  She is now currently living with her brother.  With all of her increased stress she has picked back up smoking and has gone from 4 cigarettes/day now to greater than a pack per day.  She attributes her elevated BP to her increased stress level with readings at home frequently in the 283T systolic, occasionally higher.  She does report compliance with her cardiac medications though has only been taking ranolazine once per day rather than twice per day.  Since she was last seen, she is only had 2 episodes of chest pain which improved with sublingual nitro.  No recent chest pain.  She was even able to move out of her house including heavy furniture without symptoms concerning for angina.  At this time, her main issue revolves around her increased stress level and how to manage this with her PCP.   Labs independently reviewed: 06/2019 - HGB 12.9, PLT 286, potassium 4.4, BUN 33, SCr 1.07, albumin 3.5, AST/ALT normal 02/2019 - TSH normal 05/2017 - TC 156, TG 107, HDL 41, LDL 94   Past Medical History:  Diagnosis Date  . Anxiety   . Arthritis    "qwhere" (06/14/2014)  . Bipolar disorder ([place])   . CAD (coronary artery disease)    a. 2008 PCI/DES to RCA; b. 2012 PCI: 95% mRCA s/p PCI/DES; b. 2/16 PCI DES->  90% dRCA; c. 11/16 PCI: dRCA ISR s/p PCI/DES;  d. 4/18 PCI: p-mRCA 95% s/p PCI/DES; e. 02/2017 Cath: LML nl, LAD nl, D1 40, LCX min irregs, OM1 60, RCA 50-60p/m ISR, 41md, patent dRCA stent, RPDA 90ost (fills via L->R collats), EF 55-65%.  . Chronic bronchitis ([place]    "get it q yr"  . Daily headache    "when my blood pressure is up" (06/14/2014)  . Depression   . Diastolic dysfunction    a. 02/2017 Echo: EF 55-60%, no rwma, Gr1 DD, nl RV fxn.  .Marland KitchenGERD (gastroesophageal reflux disease)   . Heart murmur   . High cholesterol   . History of stomach ulcers   . Hypertension   . Insomnia   . Kidney stones   . Migraine    "haven't had them in a good while; did get them 1-2 times/yr" (06/14/2014)  . Myocardial infarction ([place] 2008; ~ 2012  . Pneumonia    "get it 1-2 times/year" (06/14/2014)  . RLS (restless legs syndrome)   . Seizures ([place]   . Sleep apnea    "they want me to do the  lab but I haven't" (06/14/2014)  . Stroke [facility])    "they say I've had several" (06/14/2014)  . Tobacco abuse   . Type II diabetes mellitus ([place])   . Urinary, incontinence, stress female     Past Surgical History:  Procedure Laterality Date  . ABDOMINAL HYSTERECTOMY  1984   "I've got 1 ovary left"  . CARDIAC CATHETERIZATION N/A 02/26/2015   Procedure: Left Heart Cath and Coronary Angiography;  Surgeon: Wellington Hampshire, MD;  Location: [place] CV LAB;  Service: Cardiovascular;  Laterality: N/A;  . CARDIAC CATHETERIZATION N/A 02/26/2015   Procedure: Coronary Stent Intervention;  Surgeon: Wellington Hampshire, MD;  Location: [place] CV LAB;  Service: Cardiovascular;  Laterality: N/A;  . CESAREAN SECTION  1984  . CORONARY ANGIOPLASTY WITH STENT PLACEMENT  2008; ~ 2012   "1; 1"  . CORONARY BALLOON ANGIOPLASTY N/A 06/09/2017   Procedure: CORONARY BALLOON ANGIOPLASTY;  Surgeon: Nelva Bush, MD;  Location: [place] CV LAB;  Service: Cardiovascular;  Laterality: N/A;  . CORONARY STENT INTERVENTION N/A  08/04/2016   Procedure: Coronary Stent Intervention;  Surgeon: Wellington Hampshire, MD;  Location: [place] CV LAB;  Service: Cardiovascular;  Laterality: N/A;  . DILATION AND CURETTAGE OF UTERUS    . EXTRACORPOREAL SHOCK WAVE LITHOTRIPSY  X 2  . I & D EXTREMITY Left 02/09/2018   Procedure: IRRIGATION AND DEBRIDEMENT EXTREMITY;  Surgeon: Herbert Pun, MD;  Location: [facility] ORS;  Service: General;  Laterality: Left;  . KNEE ARTHROSCOPY Left   . LEFT HEART CATH Bilateral 08/04/2016   Procedure: Left Heart Cath poss PCI;  Surgeon: Wellington Hampshire, MD;  Location: [place] CV LAB;  Service: Cardiovascular;  Laterality: Bilateral;  . LEFT HEART CATH AND CORONARY ANGIOGRAPHY Left 02/19/2017   Procedure: LEFT HEART CATH AND CORONARY ANGIOGRAPHY;  Surgeon: Minna Merritts, MD;  Location: [place] CV LAB;  Service: Cardiovascular;  Laterality: Left;  . LEFT HEART CATH AND CORONARY ANGIOGRAPHY Left 06/09/2017   Procedure: LEFT HEART CATH AND CORONARY ANGIOGRAPHY;  Surgeon: Nelva Bush, MD;  Location: [place] CV LAB;  Service: Cardiovascular;  Laterality: Left;  . LEFT HEART CATHETERIZATION WITH CORONARY ANGIOGRAM N/A 06/15/2014   Procedure: LEFT HEART CATHETERIZATION WITH CORONARY ANGIOGRAM;  Surgeon: Jettie Booze, MD;  Location: [facility] CATH LAB;  Service: Cardiovascular;  Laterality: N/A;  . PERCUTANEOUS CORONARY STENT INTERVENTION (PCI-S)  06/15/2014   Procedure: PERCUTANEOUS CORONARY STENT INTERVENTION (PCI-S);  Surgeon: Jettie Booze, MD;  Location: [facility] CATH LAB;  Service: Cardiovascular;;  . TONSILLECTOMY    . TUBAL LIGATION      Current Medications: Current Meds  Medication Sig  . albuterol (PROVENTIL HFA;VENTOLIN HFA) 108 (90 Base) MCG/ACT inhaler Inhale 2 puffs into the lungs every 4 (four) hours as needed for wheezing or shortness of breath.  [name] aspirin EC 81 MG EC tablet Take 1 tablet (81 mg total) by mouth daily.  [name] atorvastatin (LIPITOR) 80 MG tablet Take  1 tablet (80 mg total) by mouth daily. PLEASE CONTACT OFFICE [phone] TO SCHEDULE APPOINTMENT FOR FUTURE REFILLS  . Blood Glucose Monitoring Suppl W/DEVICE KIT Test 1-2 times daily; E11.65 diagnosis  . busPIRone (BUSPAR) 5 MG tablet 1 po bid prn anxiety  . clopidogrel (PLAVIX) 75 MG tablet TAKE 1 TABLET BY MOUTH EVERY DAY WITH BREAKFAST. PLEASE KEEP SCHEDULED APPOINTMENT  . cyclobenzaprine (FLEXERIL) 5 MG tablet Take 5 mg by mouth 3 (three) times daily as needed for muscle spasms.   . fluticasone (FLONASE)  50 MCG/ACT nasal spray Place 2 sprays into both nostrils daily.  . furosemide (LASIX) 20 MG tablet TAKE 1 TABLET BY MOUTH EVERY DAY  . gabapentin (NEURONTIN) 600 MG tablet Take 900 mg by mouth 4 (four) times daily.   [name] liraglutide (VICTOZA) 18 MG/3ML SOPN Inject 1.8 mg into the skin daily.   [name] lisinopril (PRINIVIL,ZESTRIL) 40 MG tablet Take 40 mg by mouth daily.  . metoprolol succinate (TOPROL-XL) 50 MG 24 hr tablet TAKE 1 TABLET BY MOUTH EVERY DAY. TAKE WITH OR IMMEDIATELY FOLLOWING A MEAL  . nitroGLYCERIN (NITROSTAT) 0.4 MG SL tablet Place under the tongue.  . pantoprazole (PROTONIX) 40 MG tablet TAKE 1 TABLET(40 MG) BY MOUTH DAILY  . polyethylene glycol (MIRALAX / GLYCOLAX) packet Take 17 g by mouth daily as needed for mild constipation.  . QUEtiapine (SEROQUEL) 50 MG tablet   . ranolazine (RANEXA) 500 MG 12 hr tablet TAKE 1 TABLET BY MOUTH TWICE A DAY  . senna (SENOKOT) 8.6 MG tablet Senna Lax 8.6 mg tablet  take 2 tablets by mouth NIGHTLY AS NEEDED FOR CONSTIPATION  . tiZANidine (ZANAFLEX) 2 MG tablet Take by mouth.  . TRESIBA FLEXTOUCH 200 UNIT/ML SOPN Inject 66 Units into the skin at bedtime.     Allergies:   Carbamazepine, Lithium, Lodine [etodolac], Codeine, Levofloxacin in d5w, Methocarbamol, Tape, and Relafen [nabumetone]   Social History   Socioeconomic History  . Marital status: Legally Separated    Spouse name: Not on file  . Number of children: Not on file  . Years  of education: Not on file  . Highest education level: Not on file  Occupational History  . Not on file  Tobacco Use  . Smoking status: Current Every Day Smoker    Packs/day: 0.25    Years: 35.00    Pack years: 8.75    Types: Cigarettes  . Smokeless tobacco: Never Used  . Tobacco comment: Patches  Substance and Sexual Activity  . Alcohol use: Yes    Alcohol/week: 0.0 standard drinks    Comment: occ  . Drug use: Yes    Types: Marijuana  . Sexual activity: Not on file  Other Topics Concern  . Not on file  Social History Narrative  . Not on file   Social Determinants of Health   Financial Resource Strain:   . Difficulty of Paying Living Expenses: Not on file  Food Insecurity:   . Worried About Charity fundraiser in the Last Year: Not on file  . Ran Out of Food in the Last Year: Not on file  Transportation Needs:   . Lack of Transportation (Medical): Not on file  . Lack of Transportation (Non-Medical): Not on file  Physical Activity:   . Days of Exercise per Week: Not on file  . Minutes of Exercise per Session: Not on file  Stress:   . Feeling of Stress : Not on file  Social Connections:   . Frequency of Communication with Friends and Family: Not on file  . Frequency of Social Gatherings with Friends and Family: Not on file  . Attends Religious Services: Not on file  . Active Member of Clubs or Organizations: Not on file  . Attends Archivist Meetings: Not on file  . Marital Status: Not on file     Family History:  The patient's family history includes Cirrhosis in her father; Lung cancer in her mother.  ROS:   Review of Systems  Constitutional: Negative for chills, diaphoresis, fever, malaise/fatigue  and weight loss.  HENT: Negative for congestion.   Eyes: Negative for discharge and redness.  Respiratory: Negative for cough, sputum production, shortness of breath and wheezing.   Cardiovascular: Positive for chest pain. Negative for palpitations,  orthopnea, claudication, leg swelling and PND.       2 brief episodes of chest pain since she was last seen, none recently  Gastrointestinal: Negative for abdominal pain, heartburn, melena, nausea and vomiting.  Musculoskeletal: Negative for falls and myalgias.  Skin: Negative for rash.  Neurological: Negative for dizziness, tingling, tremors, sensory change, speech change, focal weakness, loss of consciousness and weakness.  Endo/Heme/Allergies: Does not bruise/bleed easily.  Psychiatric/Behavioral: Negative for substance abuse. The patient is nervous/anxious.   All other systems reviewed and are negative.    EKGs/Labs/Other Studies Reviewed:    Studies reviewed were summarized above. The additional studies were reviewed today: As above.   EKG:  EKG is ordered today.  The EKG ordered today demonstrates NSR, 82 bpm, no acute ST-T changes  Recent Labs: 07/12/2019: ALT 18; BUN 33; Creatinine, Ser 1.07; Hemoglobin 12.9; Platelets 286; Potassium 4.4; Sodium 136  Recent Lipid Panel    Component Value Date/Time   CHOL 156 06/18/2017 1017   TRIG 107 06/18/2017 1017   HDL 41 06/18/2017 1017   CHOLHDL 3.8 06/18/2017 1017   VLDL 21 06/18/2017 1017   LDLCALC 94 06/18/2017 1017    PHYSICAL EXAM:    VS:  BP (!) 166/98 (BP Location: Left Arm, Patient Position: Sitting, Cuff Size: Normal)   Pulse 82   Ht '5\' 2"'$  (1.575 m)   Wt 194 lb 3.2 oz (88.1 kg)   SpO2 96%   BMI 35.52 kg/m   BMI: Body mass index is 35.52 kg/m.  Physical Exam Constitutional:      Appearance: She is well-developed.  HENT:     Head: Normocephalic and atraumatic.  Eyes:     General:        Right eye: No discharge.        Left eye: No discharge.  Neck:     Vascular: No JVD.  Cardiovascular:     Rate and Rhythm: Normal rate and regular rhythm.     Pulses: No midsystolic click and no opening snap.          Dorsalis pedis pulses are 2+ on the right side and 2+ on the left side.       Posterior tibial pulses are 2+  on the right side and 2+ on the left side.     Heart sounds: Normal heart sounds, S1 normal and S2 normal. Heart sounds not distant. No murmur heard.  No friction rub.  Pulmonary:     Effort: Pulmonary effort is normal. No respiratory distress.     Breath sounds: Normal breath sounds. No decreased breath sounds, wheezing or rales.  Chest:     Chest wall: No tenderness.  Abdominal:     General: There is no distension.     Palpations: Abdomen is soft.     Tenderness: There is no abdominal tenderness.  Musculoskeletal:     Cervical back: Normal range of motion.  Skin:    General: Skin is warm and dry.     Nails: There is no clubbing.  Neurological:     Mental Status: She is alert and oriented to person, place, and time.  Psychiatric:        Speech: Speech normal.        Behavior: Behavior normal.  Thought Content: Thought content normal.        Judgment: Judgment normal.     Wt Readings from Last 3 Encounters:  12/09/19 194 lb 3.2 oz (88.1 kg)  07/12/19 213 lb (96.6 kg)  07/24/18 205 lb (93 kg)     ASSESSMENT & PLAN:   1. CAD involving the native coronary arteries with stable angina: She is doing well without any symptoms concerning for worsening angina.  Continue indefinite dual antiplatelet therapy with aspirin and Plavix along with atorvastatin, metoprolol, lisinopril, and ranolazine.  Aggressive risk factor modification and secondary prevention including complete smoking cessation is recommended.  No indication for ischemic evaluation at this time.  Check CBC.  2. HTN: Blood pressure is elevated, likely in the setting of increased stress at home.  Increase Toprol-XL to 100 mg daily.  To otherwise continue current dose lisinopril 40 mg daily and furosemide 20 mg daily.  Low-sodium diet and smoking cessation recommended.  3. HLD: LDL of 94 from 05/2017 with goal being less than 70.  Continue atorvastatin 80 mg daily.  Check CMP, lipid panel, and direct LDL.  Goal LDL less  than 70.  If LDL remains above goal recommend addition of Zetia 10 mg daily.  4. Tobacco use/COPD: No active exacerbation.  Complete cessation of tobacco was recommended.  5. Diabetes: Followed by PCP.  6. Morbid obesity: Weight loss recommended.  7. GERD: Refill Protonix.  Disposition: F/u with Dr. Rockey Situ or an APP in 3 months.   Medication Adjustments/Labs and Tests Ordered: Current medicines are reviewed at length with the patient today.  Concerns regarding medicines are outlined above. Medication changes, Labs and Tests ordered today are summarized above and listed in the Patient Instructions accessible in Encounters.   Signed, Christell Faith, PA-C 12/09/2019 2:59 PM     [place],  [phone]

## 2019-12-09 ENCOUNTER — Other Ambulatory Visit: Payer: Self-pay

## 2019-12-09 ENCOUNTER — Ambulatory Visit (INDEPENDENT_AMBULATORY_CARE_PROVIDER_SITE_OTHER): Payer: Medicare HMO | Admitting: Physician Assistant

## 2019-12-09 ENCOUNTER — Encounter: Payer: Self-pay | Admitting: Physician Assistant

## 2019-12-09 VITALS — BP 166/98 | HR 82 | Ht 62.0 in | Wt 194.2 lb

## 2019-12-09 DIAGNOSIS — I10 Essential (primary) hypertension: Secondary | ICD-10-CM

## 2019-12-09 DIAGNOSIS — E1165 Type 2 diabetes mellitus with hyperglycemia: Secondary | ICD-10-CM

## 2019-12-09 DIAGNOSIS — E785 Hyperlipidemia, unspecified: Secondary | ICD-10-CM

## 2019-12-09 DIAGNOSIS — K219 Gastro-esophageal reflux disease without esophagitis: Secondary | ICD-10-CM

## 2019-12-09 DIAGNOSIS — I25118 Atherosclerotic heart disease of native coronary artery with other forms of angina pectoris: Secondary | ICD-10-CM

## 2019-12-09 DIAGNOSIS — J449 Chronic obstructive pulmonary disease, unspecified: Secondary | ICD-10-CM

## 2019-12-09 DIAGNOSIS — Z720 Tobacco use: Secondary | ICD-10-CM

## 2019-12-09 DIAGNOSIS — E118 Type 2 diabetes mellitus with unspecified complications: Secondary | ICD-10-CM

## 2019-12-09 MED ORDER — RANOLAZINE ER 500 MG PO TB12: 500.0000 mg | ORAL_TABLET | Freq: Two times a day (BID) | ORAL | 3 refills | Status: DC

## 2019-12-09 MED ORDER — LISINOPRIL 40 MG PO TABS: 40.0000 mg | ORAL_TABLET | Freq: Every day | ORAL | 3 refills | Status: DC

## 2019-12-09 MED ORDER — ATORVASTATIN CALCIUM 80 MG PO TABS: 80.0000 mg | ORAL_TABLET | Freq: Every day | ORAL | 3 refills | Status: DC

## 2019-12-09 MED ORDER — CLOPIDOGREL BISULFATE 75 MG PO TABS: ORAL_TABLET | ORAL | 3 refills | Status: DC

## 2019-12-09 MED ORDER — PANTOPRAZOLE SODIUM 40 MG PO TBEC: DELAYED_RELEASE_TABLET | ORAL | 6 refills | Status: DC

## 2019-12-09 MED ORDER — FUROSEMIDE 20 MG PO TABS: 20.0000 mg | ORAL_TABLET | Freq: Every day | ORAL | 3 refills | Status: DC

## 2019-12-09 MED ORDER — METOPROLOL SUCCINATE ER 100 MG PO TB24: 100.0000 mg | ORAL_TABLET | Freq: Every day | ORAL | 3 refills | Status: DC

## 2019-12-09 NOTE — Patient Instructions (Signed)
Medication Instructions:  1- START Toprol Take 1 tablet (100 mg total) by mouth daily. Take with or immediately following a meal. *If you need a refill on your cardiac medications before your next appointment, please call your pharmacy*   Lab Work: Your physician recommends that you have lab work today(CMP, CBC, lipid w direct)  If you have labs (blood work) drawn today and your tests are completely normal, you will receive your results only by: [name] MyChart Message (if you have MyChart) OR . A paper copy in the mail If you have any lab test that is abnormal or we need to change your treatment, we will call you to review the results.   Testing/Procedures: None ordered   Follow-Up: At [facility], you and your health needs are our priority.  As part of our continuing mission to provide you with exceptional heart care, we have created designated Provider Care Teams.  These Care Teams include your primary Cardiologist (physician) and Advanced Practice Providers (APPs -  Physician Assistants and Nurse Practitioners) who all work together to provide you with the care you need, when you need it.  We recommend signing up for the patient portal called "MyChart".  Sign up information is provided on this After Visit Summary.  MyChart is used to connect with patients for Virtual Visits (Telemedicine).  Patients are able to view lab/test results, encounter notes, upcoming appointments, etc.  Non-urgent messages can be sent to your provider as well.   To learn more about what you can do with MyChart, go to [email or web address].    Your next appointment:   3 month(s)  The format for your next appointment:   In Person  Provider:    You may see Julien Nordmann, MD or Eula Listen, PA-C

## 2019-12-10 LAB — LDL CHOLESTEROL, DIRECT: LDL Direct: 98 mg/dL (ref 0–99)

## 2019-12-10 LAB — COMPREHENSIVE METABOLIC PANEL
ALT: 17 IU/L (ref 0–32)
AST: 10 IU/L (ref 0–40)
Albumin/Globulin Ratio: 1.9 (ref 1.2–2.2)
Albumin: 4.5 g/dL (ref 3.8–4.9)
Alkaline Phosphatase: 136 IU/L — ABNORMAL HIGH (ref 48–121)
BUN/Creatinine Ratio: 19 (ref 9–23)
BUN: 17 mg/dL (ref 6–24)
Bilirubin Total: <0.2 mg/dL (ref 0.0–1.2)
CO2: 25 mmol/L (ref 20–29)
Calcium: 9.6 mg/dL (ref 8.7–10.2)
Chloride: 105 mmol/L (ref 96–106)
Creatinine, Ser: 0.9 mg/dL (ref 0.57–1.00)
GFR calc Af Amer: 83 mL/min/{1.73_m2} (ref >59)
GFR calc non Af Amer: 72 mL/min/{1.73_m2} (ref >59)
Globulin, Total: 2.4 g/dL (ref 1.5–4.5)
Glucose: 230 mg/dL — ABNORMAL HIGH (ref 65–99)
Potassium: 4.4 mmol/L (ref 3.5–5.2)
Sodium: 142 mmol/L (ref 134–144)
Total Protein: 6.9 g/dL (ref 6.0–8.5)

## 2019-12-10 LAB — LIPID PANEL
Chol/HDL Ratio: 4.5 ratio — ABNORMAL HIGH (ref 0.0–4.4)
Cholesterol, Total: 170 mg/dL (ref 100–199)
HDL: 38 mg/dL — ABNORMAL LOW (ref >39)
LDL Chol Calc (NIH): 103 mg/dL — ABNORMAL HIGH (ref 0–99)
Triglycerides: 168 mg/dL — ABNORMAL HIGH (ref 0–149)
VLDL Cholesterol Cal: 29 mg/dL (ref 5–40)

## 2019-12-10 LAB — CBC
Hematocrit: 45 % (ref 34.0–46.6)
Hemoglobin: 14.3 g/dL (ref 11.1–15.9)
MCH: 30 pg (ref 26.6–33.0)
MCHC: 31.8 g/dL (ref 31.5–35.7)
MCV: 94 fL (ref 79–97)
Platelets: 287 10*3/uL (ref 150–450)
RBC: 4.77 x10E6/uL (ref 3.77–5.28)
RDW: 13.4 % (ref 11.7–15.4)
WBC: 11 10*3/uL — ABNORMAL HIGH (ref 3.4–10.8)

## 2019-12-12 ENCOUNTER — Telehealth: Payer: Self-pay

## 2019-12-12 DIAGNOSIS — I25118 Atherosclerotic heart disease of native coronary artery with other forms of angina pectoris: Secondary | ICD-10-CM

## 2019-12-12 MED ORDER — EZETIMIBE 10 MG PO TABS: 10.0000 mg | ORAL_TABLET | Freq: Every day | ORAL | 3 refills | Status: DC

## 2019-12-12 NOTE — Telephone Encounter (Signed)
NA, NVM 

## 2019-12-12 NOTE — Telephone Encounter (Signed)
Call to patient to review labs.    Pt verbalized understanding and has no further questions at this time.    Advised pt to call for any further questions or concerns.  Orders placed as advised.   

## 2019-12-12 NOTE — Telephone Encounter (Signed)
-----   Message from Creig Hines, NP sent at 12/12/2019  2:59 PM EDT ----- Blood counts ok.  WBC mildly elevated at 11.0, which appears to be chronic.  Kidney fxn, lytes and liver enzymes ok.  Alkaline phosphatase is very mildly elevated and is likely nonspecific.  LDL cholesterol is 103, which remains above goal of < 70.  I recommend adding zetia 10mg  daily w/ plan to f/u lipids/lft's in 6 wks.

## 2020-01-10 ENCOUNTER — Encounter: Payer: Self-pay | Admitting: Emergency Medicine

## 2020-01-10 ENCOUNTER — Other Ambulatory Visit: Payer: Self-pay

## 2020-01-10 DIAGNOSIS — Z79899 Other long term (current) drug therapy: Secondary | ICD-10-CM | POA: Diagnosis not present

## 2020-01-10 DIAGNOSIS — I10 Essential (primary) hypertension: Secondary | ICD-10-CM | POA: Insufficient documentation

## 2020-01-10 DIAGNOSIS — I2510 Atherosclerotic heart disease of native coronary artery without angina pectoris: Secondary | ICD-10-CM | POA: Diagnosis not present

## 2020-01-10 DIAGNOSIS — F17210 Nicotine dependence, cigarettes, uncomplicated: Secondary | ICD-10-CM | POA: Insufficient documentation

## 2020-01-10 DIAGNOSIS — F1210 Cannabis abuse, uncomplicated: Secondary | ICD-10-CM | POA: Diagnosis not present

## 2020-01-10 DIAGNOSIS — Z7982 Long term (current) use of aspirin: Secondary | ICD-10-CM | POA: Insufficient documentation

## 2020-01-10 DIAGNOSIS — R531 Weakness: Secondary | ICD-10-CM | POA: Diagnosis not present

## 2020-01-10 DIAGNOSIS — E119 Type 2 diabetes mellitus without complications: Secondary | ICD-10-CM | POA: Insufficient documentation

## 2020-01-10 LAB — CBC
HCT: 40.4 % (ref 36.0–46.0)
Hemoglobin: 13.3 g/dL (ref 12.0–15.0)
MCH: 30.4 pg (ref 26.0–34.0)
MCHC: 32.9 g/dL (ref 30.0–36.0)
MCV: 92.4 fL (ref 80.0–100.0)
Platelets: 320 10*3/uL (ref 150–400)
RBC: 4.37 MIL/uL (ref 3.87–5.11)
RDW: 14.1 % (ref 11.5–15.5)
WBC: 11.6 10*3/uL — ABNORMAL HIGH (ref 4.0–10.5)
nRBC: 0 % (ref 0.0–0.2)
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
## 2020-01-11 ENCOUNTER — Emergency Department: Payer: Medicare HMO

## 2020-01-11 ENCOUNTER — Emergency Department
Admission: EM | Admit: 2020-01-11 | Discharge: 2020-01-11 | Disposition: A | Discharge status: Home / Self Care | Payer: Medicare HMO | Attending: Emergency Medicine | Admitting: Emergency Medicine

## 2020-01-11 DIAGNOSIS — R739 Hyperglycemia, unspecified: Secondary | ICD-10-CM

## 2020-01-11 DIAGNOSIS — R531 Weakness: Secondary | ICD-10-CM

## 2020-01-11 LAB — BASIC METABOLIC PANEL
Anion gap: 8 (ref 5–15)
BUN: 21 mg/dL — ABNORMAL HIGH (ref 6–20)
CO2: 23 mmol/L (ref 22–32)
Calcium: 8.8 mg/dL — ABNORMAL LOW (ref 8.9–10.3)
Chloride: 103 mmol/L (ref 98–111)
Creatinine, Ser: 1.1 mg/dL — ABNORMAL HIGH (ref 0.44–1.00)
GFR calc Af Amer: >60 mL/min (ref >60)
GFR calc non Af Amer: 56 mL/min — ABNORMAL LOW (ref >60)
Glucose, Bld: 294 mg/dL — ABNORMAL HIGH (ref 70–99)
Potassium: 4.5 mmol/L (ref 3.5–5.1)
Sodium: 134 mmol/L — ABNORMAL LOW (ref 135–145)

## 2020-01-11 LAB — URINALYSIS, COMPLETE (UACMP) WITH MICROSCOPIC
Bacteria, UA: NONE SEEN
Bilirubin Urine: NEGATIVE
Glucose, UA: NEGATIVE mg/dL
Hgb urine dipstick: NEGATIVE
Ketones, ur: NEGATIVE mg/dL
Leukocytes,Ua: NEGATIVE
Nitrite: NEGATIVE
Protein, ur: NEGATIVE mg/dL
Specific Gravity, Urine: 1.008 (ref 1.005–1.030)
pH: 5 (ref 5.0–8.0)

## 2020-01-11 LAB — URINE DRUG SCREEN, QUALITATIVE (ARMC ONLY)
Amphetamines, Ur Screen: NOT DETECTED
Barbiturates, Ur Screen: NOT DETECTED
Benzodiazepine, Ur Scrn: POSITIVE — AB
Cannabinoid 50 Ng, Ur ~~LOC~~: POSITIVE — AB
Cocaine Metabolite,Ur ~~LOC~~: NOT DETECTED
MDMA (Ecstasy)Ur Screen: NOT DETECTED
Methadone Scn, Ur: NOT DETECTED
Opiate, Ur Screen: NOT DETECTED
Phencyclidine (PCP) Ur S: NOT DETECTED
Tricyclic, Ur Screen: POSITIVE — AB

## 2020-01-11 LAB — GLUCOSE, CAPILLARY
Glucose-Capillary: 336 mg/dL — ABNORMAL HIGH (ref 70–99)
Glucose-Capillary: 287 mg/dL — ABNORMAL HIGH (ref 70–99)

## 2020-01-11 LAB — TROPONIN I (HIGH SENSITIVITY): Troponin I (High Sensitivity): 6 ng/L (ref <18)

## 2020-01-11 MED ORDER — SODIUM CHLORIDE 0.9 % IV BOLUS
1000.0000 mL | Freq: Once | INTRAVENOUS | Status: AC
  Administered 2020-01-11: 1000 mL via INTRAVENOUS

## 2020-01-11 MED ORDER — SODIUM CHLORIDE 0.9 % IV BOLUS: 1000.0000 mL | Freq: Once | INTRAVENOUS | Status: DC
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
## 2020-01-11 NOTE — ED Notes (Signed)
  Pt transported to ct 

## 2020-01-11 NOTE — ED Notes (Signed)
Pt voicing concern being discharged when she states "she cannot walk still". Prior RN reports that pt was able to walk to toilet in room with no issue. Pt states she did not remember this RN checking her blood sugar even though it is documented. EDP in room to dicuss pt's medical findings which pt was still unhappy about. Pt in room with CN and was able to place herself in the wheelchair with minimal assistance. Pt wheeled to lobby by CN and security.

## 2020-03-09 NOTE — Progress Notes (Signed)
Date:  03/12/2020   ID:  Jennifer [name], DOB May 18, 1960, MRN [id number]  Patient Location:  [place] [address]   Provider location:   [facility], [place] office  PCP:  Mateo Flow, MD  Cardiologist:  [name], MD   Chief Complaint  Patient presents with  . Follow-up    3 months  Pt c/o chest pain that occurs daily--getting worse. First time she had to take Nitrox2. Has been under a lot of stress lately--is being evicted---so much family stress. Has also been having severe diarrhea.     History of Present Illness:    Jennifer [name] is a 59 y.o. female   past medical history of Poorly controlled diabetes type 2, hemoglobin A1c 13 Active smoker coronary artery disease, stent to the mid RCA 2008, repeat stenting to the mid RCA 2012 ,   catheterization February 2016 showing severe distal RCA disease estimated at 90%, patent mid RCA stents, also with 70% mid to distal circumflex disease of a small vessel,  Catheterization April 2018 stent to the right Catheterization November 2018 medical management recommended presenting for routine follow-up of her coronary artery disease  Reports having recent stressors Was recently evicted, Lots of stress trying to find a new place to live, moved in with family  getting evicted again  Having terrible diarrhea, Having panic attacks, funny feeling in mouth,   Took a xanax from a friend, the diarrhea got better, was able to sleep With seroquel, feels this is a good regimen Other medication such as hydroxyzine, buspirone were not working for her Raising a 59 yo, additional stress  IBP for pain Not sure if it gets absorbed as she has significant diarrhea  EKG personally reviewed by myself on todays visit Shows normal sinus rhythm rate 83 bpm no significant ST or T wave changes  Other past medical history reviewed Cardiac catheterization November 2018 Medical management recommended for moderate  proximal RCA disease, left circumflex disease  Echocardiogram done November 2018 Normal LV function  cardiac catheterization April 2018 for unstable angina, minimal troponin elevation,  Procedure April 2018 with ISR of RCA stent proximal to mid Vessel PCI with resolute onyx 3.5 x 30 mm  Echo: 02/2017 Left ventricle: The cavity size was normal. Systolic function was   normal. The estimated ejection fraction was in the range of 55%   to 60%. Wall motion was normal; there were no regional wall   motion abnormalities. Doppler parameters are consistent with   abnormal left ventricular relaxation (grade 1 diastolic   dysfunction). - Right ventricle: Systolic function was normal. - Pulmonary arteries: Systolic pressure was within the normal  range.   Past Medical History:  Diagnosis Date  . Anxiety   . Arthritis    "qwhere" (06/14/2014)  . Bipolar disorder ([place])   . CAD (coronary artery disease)    a. 2008 PCI/DES to RCA; b. 2012 PCI: 95% mRCA s/p PCI/DES; b. 2/16 PCI DES-> 90% dRCA; c. 11/16 PCI: dRCA ISR s/p PCI/DES;  d. 4/18 PCI: p-mRCA 95% s/p PCI/DES; e. 02/2017 Cath: LML nl, LAD nl, D1 40, LCX min irregs, OM1 60, RCA 50-60p/m ISR, 54md, patent dRCA stent, RPDA 90ost (fills via L->R collats), EF 55-65%.  . Chronic bronchitis ([place]    "get it q yr"  . Daily headache    "when my blood pressure is up" (06/14/2014)  . Depression   . Diastolic dysfunction    a. 02/2017  Echo: EF 55-60%, no rwma, Gr1 DD, nl RV fxn.  [name] GERD (gastroesophageal reflux disease)   . Heart murmur   . High cholesterol   . History of stomach ulcers   . Hypertension   . Insomnia   . Kidney stones   . Migraine    "haven't had them in a good while; did get them 1-2 times/yr" (06/14/2014)  . Myocardial infarction ([place]) 2008; ~ 2012  . Pneumonia    "get it 1-2 times/year" (06/14/2014)  . RLS (restless legs syndrome)   . Seizures ([place])   . Sleep apnea    "they want me to do the lab but I haven't" (06/14/2014)   . Stroke [facility])    "they say I've had several" (06/14/2014)  . Tobacco abuse   . Type II diabetes mellitus ([place])   . Urinary, incontinence, stress female    Past Surgical History:  Procedure Laterality Date  . ABDOMINAL HYSTERECTOMY  1984   "I've got 1 ovary left"  . CARDIAC CATHETERIZATION N/A 02/26/2015   Procedure: Left Heart Cath and Coronary Angiography;  Surgeon: Wellington Hampshire, MD;  Location: [place] [facility];  Service: Cardiovascular;  Laterality: N/A;  . CARDIAC CATHETERIZATION N/A 02/26/2015   Procedure: Coronary Stent Intervention;  Surgeon: Wellington Hampshire, MD;  Location: [place] [facility];  Service: Cardiovascular;  Laterality: N/A;  . CESAREAN SECTION  1984  . CORONARY ANGIOPLASTY WITH STENT PLACEMENT  2008; ~ 2012   "1; 1"  . CORONARY BALLOON ANGIOPLASTY N/A 06/09/2017   Procedure: CORONARY BALLOON ANGIOPLASTY;  Surgeon: Nelva Bush, MD;  Location: [place] [facility];  Service: Cardiovascular;  Laterality: N/A;  . CORONARY STENT INTERVENTION N/A 08/04/2016   Procedure: Coronary Stent Intervention;  Surgeon: Wellington Hampshire, MD;  Location: [place] [facility];  Service: Cardiovascular;  Laterality: N/A;  . DILATION AND CURETTAGE OF UTERUS    . EXTRACORPOREAL SHOCK WAVE LITHOTRIPSY  X 2  . I & D EXTREMITY Left 02/09/2018   Procedure: IRRIGATION AND DEBRIDEMENT EXTREMITY;  Surgeon: Herbert Pun, MD;  Location: [facility] OR[facility];  Service: General;  Laterality: Left;  . KNEE ARTHROSCOPY Left   . LEFT HEART CATH Bilateral 08/04/2016   Procedure: Left Heart Cath poss PCI;  Surgeon: Wellington Hampshire, MD;  Location: [place] [facility];  Service: Cardiovascular;  Laterality: Bilateral;  . LEFT HEART CATH AND CORONARY ANGIOGRAPHY Left 02/19/2017   Procedure: LEFT HEART CATH AND CORONARY ANGIOGRAPHY;  Surgeon: Minna Merritts, MD;  Location: [place] [facility];  Service: Cardiovascular;  Laterality: Left;  . LEFT HEART CATH AND CORONARY ANGIOGRAPHY Left 06/09/2017     Procedure: LEFT HEART CATH AND CORONARY ANGIOGRAPHY;  Surgeon: Nelva Bush, MD;  Location: [place] [facility];  Service: Cardiovascular;  Laterality: Left;  . LEFT HEART CATHETERIZATION WITH CORONARY ANGIOGRAM N/A 06/15/2014   Procedure: LEFT HEART CATHETERIZATION WITH CORONARY ANGIOGRAM;  Surgeon: Jettie Booze, MD;  Location: [facility] CATH LAB;  Service: Cardiovascular;  Laterality: N/A;  . PERCUTANEOUS CORONARY STENT INTERVENTION (PCI-S)  06/15/2014   Procedure: PERCUTANEOUS CORONARY STENT INTERVENTION (PCI-S);  Surgeon: Jettie Booze, MD;  Location: [facility] CATH LAB;  Service: Cardiovascular;;  . TONSILLECTOMY    . TUBAL LIGATION       Current Meds  Medication Sig  . albuterol (PROVENTIL HFA;VENTOLIN HFA) 108 (90 Base) MCG/ACT inhaler Inhale 2 puffs into the lungs every 4 (four) hours as needed for wheezing or shortness of breath.  [name] aspirin EC 81 MG  EC tablet Take 1 tablet (81 mg total) by mouth daily.  [name] atorvastatin (LIPITOR) 80 MG tablet Take 1 tablet (80 mg total) by mouth daily.  . Blood Glucose Monitoring Suppl W/DEVICE KIT Test 1-2 times daily; E11.65 diagnosis  . busPIRone (BUSPAR) 5 MG tablet 1 po bid prn anxiety  . clopidogrel (PLAVIX) 75 MG tablet TAKE 1 TABLET BY MOUTH EVERY DAY WITH BREAKFAST.  . cyclobenzaprine (FLEXERIL) 5 MG tablet Take 5 mg by mouth 3 (three) times daily as needed for muscle spasms.   . fluticasone (FLONASE) 50 MCG/ACT nasal spray Place 2 sprays into both nostrils daily.  . furosemide (LASIX) 20 MG tablet Take 1 tablet (20 mg total) by mouth daily.  [name] gabapentin (NEURONTIN) 600 MG tablet Take 900 mg by mouth 4 (four) times daily.   [name] liraglutide (VICTOZA) 18 MG/3ML SOPN Inject 1.8 mg into the skin daily.   [name] lisinopril (ZESTRIL) 40 MG tablet Take 1 tablet (40 mg total) by mouth daily.  . nitroGLYCERIN (NITROSTAT) 0.4 MG SL tablet Place under the tongue.  . pantoprazole (PROTONIX) 40 MG tablet TAKE 1 TABLET(40 MG) BY MOUTH DAILY  . polyethylene  glycol (MIRALAX / GLYCOLAX) packet Take 17 g by mouth daily as needed for mild constipation.  . QUEtiapine (SEROQUEL) 50 MG tablet   . ranolazine (RANEXA) 500 MG 12 hr tablet Take 1 tablet (500 mg total) by mouth 2 (two) times daily.  [name] senna (SENOKOT) 8.6 MG tablet Senna Lax 8.6 mg tablet  take 2 tablets by mouth NIGHTLY AS NEEDED FOR CONSTIPATION  . tiZANidine (ZANAFLEX) 2 MG tablet Take by mouth.  . TRESIBA FLEXTOUCH 200 UNIT/ML SOPN Inject 66 Units into the skin at bedtime.      Allergies:   Carbamazepine, Lithium, Lodine [etodolac], Codeine, Levofloxacin in d5w, Methocarbamol, Tape, and Relafen [nabumetone]   Social History   Tobacco Use  . Smoking status: Current Every Day Smoker    Packs/day: 0.25    Years: 35.00    Pack years: 8.75    Types: Cigarettes  . Smokeless tobacco: Never Used  . Tobacco comment: Patches  Substance Use Topics  . Alcohol use: Yes    Alcohol/week: 0.0 standard drinks    Comment: occ  . Drug use: Yes    Types: Marijuana     Family Hx: The patient's family history includes Cirrhosis in her father; Lung cancer in her mother.  ROS:   Please see the history of present illness.    Review of Systems  Constitutional: Negative.   Respiratory: Positive for cough, shortness of breath and wheezing.   Cardiovascular: Negative.   Gastrointestinal: Negative.   Musculoskeletal: Negative.   Neurological: Negative.   Psychiatric/Behavioral: Negative.   All other systems reviewed and are negative.     Labs/Other Tests and Data Reviewed:    Recent Labs: 12/09/2019: ALT 17 01/10/2020: BUN 21; Creatinine, Ser 1.10; Hemoglobin 13.3; Platelets 320; Potassium 4.5; Sodium 134   Recent Lipid Panel Lab Results  Component Value Date/Time   CHOL 170 12/09/2019 03:28 PM   TRIG 168 (H) 12/09/2019 03:28 PM   HDL 38 (L) 12/09/2019 03:28 PM   CHOLHDL 4.5 (H) 12/09/2019 03:28 PM   CHOLHDL 3.8 06/18/2017 10:17 AM   LDLCALC 103 (H) 12/09/2019 03:28 PM   LDLDIRECT  98 12/09/2019 03:28 PM    Wt Readings from Last 3 Encounters:  03/12/20 183 lb (83 kg)  12/09/19 194 lb 3.2 oz (88.1 kg)  07/12/19 213 lb (96.6 kg)  Exam:    BP (!) 170/104   Pulse 83   Ht 5' 2" (1.575 m)   Wt 183 lb (83 kg)   BMI 33.47 kg/m  Constitutional:  oriented to person, place, and time. No distress.  HENT:  Head: Grossly normal Eyes:  no discharge. No scleral icterus.  Neck: No JVD, no carotid bruits  Cardiovascular: Regular rate and rhythm, no murmurs appreciated Pulmonary/Chest: Clear to auscultation bilaterally, no wheezes or rails Abdominal: Soft.  no distension.  no tenderness.  Musculoskeletal: Normal range of motion Neurological:  normal muscle tone. Coordination normal. No atrophy Skin: Skin warm and dry Psychiatric: normal affect, pleasant  ASSESSMENT & PLAN:    Coronary artery disease of native artery of native heart with stable angina pectoris (HCC) Atypical chest pain, likely exacerbated by underlying stressors Stressors include chronic diarrhea, addiction, other living issues, taking care of 63-year-old  Medications refilled, recommend she work on her blood pressure  Chronic obstructive pulmonary disease, unspecified COPD type ([name]) Continues to smoke, smoking cessation recommended Prior history of bronchitis  Poorly controlled type 2 diabetes mellitus with complication ([place]) Lifestyle modification recommended, low carbohydrate diet, smoking cessation, close follow-up with primary care  Essential hypertension Blood pressure elevated today, medications refilled Suggested she call our office with numbers on lisinopril 40 daily and metoprolol succinate 100 mg  Mixed hyperlipidemia Continue statin  Recommend she hold the Zetia for several weeks to see if this helps with her diarrhea  Tobacco abuse Smoking cessation recommended Smoking cessation techniques discussed  Chronic diarrhea Previously seen by GI, has had prior testing, similar  symptoms last year then symptoms resolved, she wonders if it is from stress Symptoms did improve with a Xanax  Morbid obesity ([place]) Recommended low carbohydrate diet Walking program   Total encounter time more than 25 minutes  Greater than 50% was spent in counseling and coordination of care with the patient  Signed, [name], MD  03/12/2020 5:18 PM    [place] [facility] [id number], [place], [place] [id number]

## 2020-03-12 ENCOUNTER — Ambulatory Visit (INDEPENDENT_AMBULATORY_CARE_PROVIDER_SITE_OTHER): Payer: Medicare HMO | Admitting: Cardiovascular Disease

## 2020-03-12 ENCOUNTER — Encounter: Payer: Self-pay | Admitting: Cardiovascular Disease

## 2020-03-12 ENCOUNTER — Other Ambulatory Visit: Payer: Self-pay

## 2020-03-12 VITALS — BP 170/104 | HR 83 | Ht 62.0 in | Wt 183.0 lb

## 2020-03-12 DIAGNOSIS — I25118 Atherosclerotic heart disease of native coronary artery with other forms of angina pectoris: Secondary | ICD-10-CM

## 2020-03-12 DIAGNOSIS — E1165 Type 2 diabetes mellitus with hyperglycemia: Secondary | ICD-10-CM

## 2020-03-12 DIAGNOSIS — E118 Type 2 diabetes mellitus with unspecified complications: Secondary | ICD-10-CM

## 2020-03-12 DIAGNOSIS — E785 Hyperlipidemia, unspecified: Secondary | ICD-10-CM

## 2020-03-12 DIAGNOSIS — J449 Chronic obstructive pulmonary disease, unspecified: Secondary | ICD-10-CM

## 2020-03-12 DIAGNOSIS — K219 Gastro-esophageal reflux disease without esophagitis: Secondary | ICD-10-CM

## 2020-03-12 DIAGNOSIS — I10 Essential (primary) hypertension: Secondary | ICD-10-CM

## 2020-03-12 DIAGNOSIS — Z720 Tobacco use: Secondary | ICD-10-CM

## 2020-03-12 MED ORDER — EZETIMIBE 10 MG PO TABS
10.0000 mg | ORAL_TABLET | Freq: Every day | ORAL | 3 refills | Status: DC
Start: 2020-03-12 — End: 2020-07-27

## 2020-03-12 MED ORDER — METOPROLOL SUCCINATE ER 50 MG PO TB24
50.0000 mg | ORAL_TABLET | Freq: Two times a day (BID) | ORAL | 3 refills | Status: DC
Start: 2020-03-12 — End: 2020-07-27

## 2020-03-12 NOTE — Patient Instructions (Signed)
Medication Instructions:  No changes  If you need a refill on your cardiac medications before your next appointment, please call your pharmacy.    Lab work: No new labs needed   If you have labs (blood work) drawn today and your tests are completely normal, you will receive your results only by: . MyChart Message (if you have MyChart) OR . A paper copy in the mail If you have any lab test that is abnormal or we need to change your treatment, we will call you to review the results.   Testing/Procedures: No new testing needed   Follow-Up: At [facility], you and your health needs are our priority.  As part of our continuing mission to provide you with exceptional heart care, we have created designated Provider Care Teams.  These Care Teams include your primary Cardiologist (physician) and Advanced Practice Providers (APPs -  Physician Assistants and Nurse Practitioners) who all work together to provide you with the care you need, when you need it.  . You will need a follow up appointment in 12 months  . Providers on your designated Care Team:   . Christopher Berge, NP . Ryan Dunn, PA-C . Jacquelyn Visser, PA-C  Any Other Special Instructions Will Be Listed Below (If Applicable).  COVID-19 Vaccine Information can be found at: [email or web address] For questions related to vaccine distribution or appointments, please email [email or web address] or call [phone].     

## 2020-04-09 ENCOUNTER — Other Ambulatory Visit: Payer: Self-pay

## 2020-04-09 ENCOUNTER — Inpatient Hospital Stay
Admission: EM | Admit: 2020-04-09 | Discharge: 2020-04-11 | DRG: 281 | Disposition: A | Discharge status: Home / Self Care | Payer: Medicare HMO | Attending: Internal Medicine | Admitting: Internal Medicine

## 2020-04-09 ENCOUNTER — Emergency Department: Payer: Medicare HMO

## 2020-04-09 ENCOUNTER — Encounter: Payer: Self-pay | Admitting: Emergency Medicine

## 2020-04-09 DIAGNOSIS — R739 Hyperglycemia, unspecified: Secondary | ICD-10-CM

## 2020-04-09 DIAGNOSIS — E1169 Type 2 diabetes mellitus with other specified complication: Secondary | ICD-10-CM | POA: Diagnosis not present

## 2020-04-09 DIAGNOSIS — R079 Chest pain, unspecified: Secondary | ICD-10-CM | POA: Diagnosis present

## 2020-04-09 DIAGNOSIS — E871 Hypo-osmolality and hyponatremia: Secondary | ICD-10-CM | POA: Diagnosis present

## 2020-04-09 DIAGNOSIS — E7800 Pure hypercholesterolemia, unspecified: Secondary | ICD-10-CM | POA: Diagnosis present

## 2020-04-09 DIAGNOSIS — Z881 Allergy status to other antibiotic agents status: Secondary | ICD-10-CM

## 2020-04-09 DIAGNOSIS — F419 Anxiety disorder, unspecified: Secondary | ICD-10-CM | POA: Diagnosis present

## 2020-04-09 DIAGNOSIS — Z955 Presence of coronary angioplasty implant and graft: Secondary | ICD-10-CM

## 2020-04-09 DIAGNOSIS — F17210 Nicotine dependence, cigarettes, uncomplicated: Secondary | ICD-10-CM | POA: Diagnosis present

## 2020-04-09 DIAGNOSIS — G4730 Sleep apnea, unspecified: Secondary | ICD-10-CM | POA: Diagnosis present

## 2020-04-09 DIAGNOSIS — I252 Old myocardial infarction: Secondary | ICD-10-CM | POA: Diagnosis not present

## 2020-04-09 DIAGNOSIS — Z7982 Long term (current) use of aspirin: Secondary | ICD-10-CM

## 2020-04-09 DIAGNOSIS — Z720 Tobacco use: Secondary | ICD-10-CM | POA: Diagnosis not present

## 2020-04-09 DIAGNOSIS — E781 Pure hyperglyceridemia: Secondary | ICD-10-CM | POA: Diagnosis present

## 2020-04-09 DIAGNOSIS — I200 Unstable angina: Secondary | ICD-10-CM | POA: Diagnosis not present

## 2020-04-09 DIAGNOSIS — K219 Gastro-esophageal reflux disease without esophagitis: Secondary | ICD-10-CM | POA: Diagnosis present

## 2020-04-09 DIAGNOSIS — Z8673 Personal history of transient ischemic attack (TIA), and cerebral infarction without residual deficits: Secondary | ICD-10-CM

## 2020-04-09 DIAGNOSIS — F17200 Nicotine dependence, unspecified, uncomplicated: Secondary | ICD-10-CM | POA: Diagnosis not present

## 2020-04-09 DIAGNOSIS — Z6833 Body mass index (BMI) 33.0-33.9, adult: Secondary | ICD-10-CM

## 2020-04-09 DIAGNOSIS — F319 Bipolar disorder, unspecified: Secondary | ICD-10-CM | POA: Diagnosis present

## 2020-04-09 DIAGNOSIS — I2510 Atherosclerotic heart disease of native coronary artery without angina pectoris: Secondary | ICD-10-CM

## 2020-04-09 DIAGNOSIS — Z801 Family history of malignant neoplasm of trachea, bronchus and lung: Secondary | ICD-10-CM

## 2020-04-09 DIAGNOSIS — R0789 Other chest pain: Secondary | ICD-10-CM

## 2020-04-09 DIAGNOSIS — Z7902 Long term (current) use of antithrombotics/antiplatelets: Secondary | ICD-10-CM

## 2020-04-09 DIAGNOSIS — Z888 Allergy status to other drugs, medicaments and biological substances status: Secondary | ICD-10-CM

## 2020-04-09 DIAGNOSIS — I10 Essential (primary) hypertension: Secondary | ICD-10-CM | POA: Diagnosis not present

## 2020-04-09 DIAGNOSIS — E876 Hypokalemia: Secondary | ICD-10-CM | POA: Diagnosis present

## 2020-04-09 DIAGNOSIS — I214 Non-ST elevation (NSTEMI) myocardial infarction: Secondary | ICD-10-CM | POA: Diagnosis present

## 2020-04-09 DIAGNOSIS — Z885 Allergy status to narcotic agent status: Secondary | ICD-10-CM

## 2020-04-09 DIAGNOSIS — E785 Hyperlipidemia, unspecified: Secondary | ICD-10-CM | POA: Diagnosis present

## 2020-04-09 DIAGNOSIS — Z20822 Contact with and (suspected) exposure to covid-19: Secondary | ICD-10-CM | POA: Diagnosis present

## 2020-04-09 DIAGNOSIS — E669 Obesity, unspecified: Secondary | ICD-10-CM | POA: Diagnosis present

## 2020-04-09 DIAGNOSIS — J449 Chronic obstructive pulmonary disease, unspecified: Secondary | ICD-10-CM | POA: Diagnosis present

## 2020-04-09 DIAGNOSIS — K529 Noninfective gastroenteritis and colitis, unspecified: Secondary | ICD-10-CM | POA: Diagnosis present

## 2020-04-09 DIAGNOSIS — I25110 Atherosclerotic heart disease of native coronary artery with unstable angina pectoris: Secondary | ICD-10-CM | POA: Diagnosis present

## 2020-04-09 DIAGNOSIS — E1165 Type 2 diabetes mellitus with hyperglycemia: Secondary | ICD-10-CM

## 2020-04-09 DIAGNOSIS — M159 Polyosteoarthritis, unspecified: Secondary | ICD-10-CM | POA: Diagnosis present

## 2020-04-09 DIAGNOSIS — G894 Chronic pain syndrome: Secondary | ICD-10-CM | POA: Diagnosis present

## 2020-04-09 DIAGNOSIS — Z79899 Other long term (current) drug therapy: Secondary | ICD-10-CM

## 2020-04-09 DIAGNOSIS — G2581 Restless legs syndrome: Secondary | ICD-10-CM | POA: Diagnosis present

## 2020-04-09 LAB — CBC
HCT: 45 % (ref 36.0–46.0)
Hemoglobin: 15.1 g/dL — ABNORMAL HIGH (ref 12.0–15.0)
MCH: 30.8 pg (ref 26.0–34.0)
MCHC: 33.6 g/dL (ref 30.0–36.0)
MCV: 91.6 fL (ref 80.0–100.0)
Platelets: 253 10*3/uL (ref 150–400)
RBC: 4.91 MIL/uL (ref 3.87–5.11)
RDW: 13.6 % (ref 11.5–15.5)
WBC: 8.3 10*3/uL (ref 4.0–10.5)
nRBC: 0 % (ref 0.0–0.2)

## 2020-04-09 LAB — BASIC METABOLIC PANEL
Anion gap: 12 (ref 5–15)
BUN: 12 mg/dL (ref 6–20)
CO2: 21 mmol/L — ABNORMAL LOW (ref 22–32)
Calcium: 9.7 mg/dL (ref 8.9–10.3)
Chloride: 98 mmol/L (ref 98–111)
Creatinine, Ser: 0.73 mg/dL (ref 0.44–1.00)
GFR, Estimated: >60 mL/min (ref >60)
Glucose, Bld: 616 mg/dL (ref 70–99)
Potassium: 4.2 mmol/L (ref 3.5–5.1)
Sodium: 131 mmol/L — ABNORMAL LOW (ref 135–145)

## 2020-04-09 LAB — RESP PANEL BY RT-PCR (FLU A&B, COVID) ARPGX2
Influenza A by PCR: NEGATIVE
Influenza B by PCR: NEGATIVE
SARS Coronavirus 2 by RT PCR: NEGATIVE

## 2020-04-09 LAB — TROPONIN I (HIGH SENSITIVITY)
Troponin I (High Sensitivity): 13 ng/L (ref <18)
Troponin I (High Sensitivity): 87 ng/L — ABNORMAL HIGH (ref <18)
Troponin I (High Sensitivity): 244 ng/L (ref <18)

## 2020-04-09 LAB — CBG MONITORING, ED
Glucose-Capillary: 397 mg/dL — ABNORMAL HIGH (ref 70–99)
Glucose-Capillary: 469 mg/dL — ABNORMAL HIGH (ref 70–99)

## 2020-04-09 MED ORDER — EZETIMIBE 10 MG PO TABS
10.0000 mg | ORAL_TABLET | Freq: Every day | ORAL | Status: DC
  Administered 2020-04-11: 08:00:00 10 mg via ORAL
  Filled 2020-04-09 (×2): qty 1

## 2020-04-09 MED ORDER — LIRAGLUTIDE 18 MG/3ML ~~LOC~~ SOPN: 1.8000 mg | PEN_INJECTOR | Freq: Every day | SUBCUTANEOUS | Status: DC

## 2020-04-09 MED ORDER — POLYETHYLENE GLYCOL 3350 17 G PO PACK: 17.0000 g | PACK | Freq: Every day | ORAL | Status: DC | PRN

## 2020-04-09 MED ORDER — HEPARIN (PORCINE) 25000 UT/250ML-% IV SOLN
950.0000 [IU]/h | INTRAVENOUS | Status: DC
  Administered 2020-04-09: 1000 [IU]/h via INTRAVENOUS
  Filled 2020-04-09: qty 250

## 2020-04-09 MED ORDER — ALBUTEROL SULFATE HFA 108 (90 BASE) MCG/ACT IN AERS
2.0000 | INHALATION_SPRAY | RESPIRATORY_TRACT | Status: DC | PRN
  Filled 2020-04-09: qty 6.7

## 2020-04-09 MED ORDER — FUROSEMIDE 20 MG PO TABS
20.0000 mg | ORAL_TABLET | Freq: Every day | ORAL | Status: DC
Start: 2020-04-10 — End: 2020-04-11
  Administered 2020-04-11: 08:00:00 20 mg via ORAL
  Filled 2020-04-09: qty 1

## 2020-04-09 MED ORDER — ZOLPIDEM TARTRATE 5 MG PO TABS
5.0000 mg | ORAL_TABLET | Freq: Every evening | ORAL | Status: DC | PRN
  Administered 2020-04-10: 04:00:00 5 mg via ORAL
  Filled 2020-04-09: qty 1

## 2020-04-09 MED ORDER — ASPIRIN EC 81 MG PO TBEC
81.0000 mg | DELAYED_RELEASE_TABLET | Freq: Every day | ORAL | Status: DC
  Administered 2020-04-11: 08:00:00 81 mg via ORAL
  Filled 2020-04-09: qty 1

## 2020-04-09 MED ORDER — LACTATED RINGERS IV BOLUS
1000.0000 mL | Freq: Once | INTRAVENOUS | Status: AC
  Administered 2020-04-09: 19:00:00 1000 mL via INTRAVENOUS

## 2020-04-09 MED ORDER — ASPIRIN 300 MG RE SUPP: 300.0000 mg | RECTAL | Status: DC

## 2020-04-09 MED ORDER — ACETAMINOPHEN 325 MG PO TABS
650.0000 mg | ORAL_TABLET | ORAL | Status: DC | PRN
  Administered 2020-04-10 (×2): 650 mg via ORAL
  Filled 2020-04-09 (×2): qty 2

## 2020-04-09 MED ORDER — CYCLOBENZAPRINE HCL 10 MG PO TABS: 5.0000 mg | ORAL_TABLET | Freq: Three times a day (TID) | ORAL | Status: DC | PRN

## 2020-04-09 MED ORDER — FLUTICASONE PROPIONATE 50 MCG/ACT NA SUSP
2.0000 | Freq: Every day | NASAL | Status: DC
  Filled 2020-04-09: qty 16

## 2020-04-09 MED ORDER — ASPIRIN EC 81 MG PO TBEC: 81.0000 mg | DELAYED_RELEASE_TABLET | Freq: Every day | ORAL | Status: DC

## 2020-04-09 MED ORDER — ASPIRIN 81 MG PO CHEW: 324.0000 mg | CHEWABLE_TABLET | ORAL | Status: DC

## 2020-04-09 MED ORDER — PANTOPRAZOLE SODIUM 40 MG PO TBEC
40.0000 mg | DELAYED_RELEASE_TABLET | Freq: Every day | ORAL | Status: DC
  Administered 2020-04-11: 08:00:00 40 mg via ORAL
  Filled 2020-04-09: qty 1

## 2020-04-09 MED ORDER — ATORVASTATIN CALCIUM 80 MG PO TABS
80.0000 mg | ORAL_TABLET | Freq: Every day | ORAL | Status: DC
  Administered 2020-04-11: 08:00:00 80 mg via ORAL
  Filled 2020-04-09: qty 1

## 2020-04-09 MED ORDER — GABAPENTIN 600 MG PO TABS
900.0000 mg | ORAL_TABLET | Freq: Every day | ORAL | Status: DC
  Administered 2020-04-10 (×2): 900 mg via ORAL
  Filled 2020-04-09 (×2): qty 2

## 2020-04-09 MED ORDER — ONDANSETRON HCL 4 MG/2ML IJ SOLN
4.0000 mg | Freq: Four times a day (QID) | INTRAMUSCULAR | Status: DC | PRN
  Administered 2020-04-10: 05:00:00 4 mg via INTRAVENOUS
  Filled 2020-04-09: qty 2

## 2020-04-09 MED ORDER — INSULIN ASPART 100 UNIT/ML ~~LOC~~ SOLN
0.0000 [IU] | SUBCUTANEOUS | Status: DC
  Filled 2020-04-09 (×2): qty 1

## 2020-04-09 MED ORDER — SENNA 8.6 MG PO TABS: 1.0000 | ORAL_TABLET | Freq: Every evening | ORAL | Status: DC | PRN

## 2020-04-09 MED ORDER — NITROGLYCERIN 0.4 MG SL SUBL
0.4000 mg | SUBLINGUAL_TABLET | SUBLINGUAL | Status: DC | PRN
  Administered 2020-04-10: 16:00:00 0.4 mg via SUBLINGUAL

## 2020-04-09 MED ORDER — LISINOPRIL 20 MG PO TABS
40.0000 mg | ORAL_TABLET | Freq: Every day | ORAL | Status: DC
  Administered 2020-04-11: 08:00:00 40 mg via ORAL
  Filled 2020-04-09: qty 2

## 2020-04-09 MED ORDER — RANOLAZINE ER 500 MG PO TB12
500.0000 mg | ORAL_TABLET | Freq: Two times a day (BID) | ORAL | Status: DC
  Administered 2020-04-10 – 2020-04-11 (×3): 500 mg via ORAL
  Filled 2020-04-09 (×6): qty 1

## 2020-04-09 MED ORDER — CLOPIDOGREL BISULFATE 75 MG PO TABS
75.0000 mg | ORAL_TABLET | Freq: Every day | ORAL | Status: DC
Start: 2020-04-10 — End: 2020-04-11
  Administered 2020-04-11: 08:00:00 75 mg via ORAL
  Filled 2020-04-09: qty 1

## 2020-04-09 MED ORDER — ALPRAZOLAM 0.25 MG PO TABS
0.2500 mg | ORAL_TABLET | Freq: Two times a day (BID) | ORAL | Status: DC | PRN
  Administered 2020-04-10 – 2020-04-11 (×2): 0.25 mg via ORAL
  Filled 2020-04-09: qty 1

## 2020-04-09 MED ORDER — QUETIAPINE FUMARATE 25 MG PO TABS
100.0000 mg | ORAL_TABLET | Freq: Every day | ORAL | Status: DC
  Administered 2020-04-10: 01:00:00 100 mg via ORAL
  Filled 2020-04-09: qty 4

## 2020-04-09 MED ORDER — INSULIN DEGLUDEC 200 UNIT/ML ~~LOC~~ SOPN: 66.0000 [IU] | PEN_INJECTOR | Freq: Every day | SUBCUTANEOUS | Status: DC

## 2020-04-09 MED ORDER — METOPROLOL SUCCINATE ER 50 MG PO TB24
50.0000 mg | ORAL_TABLET | Freq: Two times a day (BID) | ORAL | Status: DC
Start: 2020-04-10 — End: 2020-04-11
  Administered 2020-04-11: 08:00:00 50 mg via ORAL
  Filled 2020-04-09 (×2): qty 1

## 2020-04-09 MED ORDER — GABAPENTIN 600 MG PO TABS: 900.0000 mg | ORAL_TABLET | Freq: Four times a day (QID) | ORAL | Status: DC

## 2020-04-09 MED ORDER — INSULIN GLARGINE 100 UNIT/ML ~~LOC~~ SOLN
25.0000 [IU] | Freq: Every day | SUBCUTANEOUS | Status: DC
  Administered 2020-04-10 (×2): 25 [IU] via SUBCUTANEOUS
  Filled 2020-04-09 (×3): qty 0.25

## 2020-04-09 MED ORDER — TIZANIDINE HCL 2 MG PO TABS
2.0000 mg | ORAL_TABLET | Freq: Every day | ORAL | Status: DC
  Administered 2020-04-10: 03:00:00 2 mg via ORAL
  Filled 2020-04-09 (×4): qty 1

## 2020-04-09 MED ORDER — HEPARIN BOLUS VIA INFUSION
4000.0000 [IU] | Freq: Once | INTRAVENOUS | Status: AC
  Administered 2020-04-09: 4000 [IU] via INTRAVENOUS
  Filled 2020-04-09: qty 4000

## 2020-04-09 MED ORDER — ASPIRIN 81 MG PO CHEW
243.0000 mg | CHEWABLE_TABLET | Freq: Once | ORAL | Status: AC
  Administered 2020-04-09: 22:00:00 243 mg via ORAL
  Filled 2020-04-09: qty 3

## 2020-04-09 MED ORDER — NITROGLYCERIN 0.4 MG SL SUBL
0.4000 mg | SUBLINGUAL_TABLET | SUBLINGUAL | Status: DC | PRN
Start: 2020-04-09 — End: 2020-04-09

## 2020-04-09 MED ORDER — NITROGLYCERIN 2 % TD OINT
1.0000 [in_us] | TOPICAL_OINTMENT | Freq: Four times a day (QID) | TRANSDERMAL | Status: DC
  Administered 2020-04-10: 1 [in_us] via TOPICAL
  Filled 2020-04-09 (×2): qty 1

## 2020-04-09 MED ORDER — BUSPIRONE HCL 5 MG PO TABS
5.0000 mg | ORAL_TABLET | Freq: Two times a day (BID) | ORAL | Status: DC
  Administered 2020-04-10 – 2020-04-11 (×2): 5 mg via ORAL
  Filled 2020-04-09 (×4): qty 1

## 2020-04-09 NOTE — ED Notes (Signed)
Date and time results received: 04/09/20 1802 (use smartphrase ".now" to insert current time)  Test: Glucose Critical Value: 616  Name of Provider Notified: Dr. Adaline Sill  Orders Received? Or Actions Taken?: Critical Results Acknolwedged

## 2020-04-09 NOTE — H&P (Signed)
[place]   PATIENT NAME: Jennifer [name]    MR#:  [id number]  DATE OF BIRTH:  1960/09/04  DATE OF ADMISSION:  04/09/2020  PRIMARY CARE PHYSICIAN: Mateo Flow, MD   REQUESTING/REFERRING PHYSICIAN: Vladimir Crofts, MD  CHIEF COMPLAINT:   Chief Complaint  Patient presents with  . Chest Pain    HISTORY OF PRESENT ILLNESS:  Jennifer [name]  is a 59 y.o. female with a known history of coronary artery disease status post PCI and stents, hypertension, COPD, diastolic dysfunction, dyslipidemia, and anxiety and bipolar disorder, presented to the emergency room with acute onset of intermittent chest pain which has been going on over the last several months and today has been constant with no relief after sublingual nitroglycerin.  She describes it as pressure there is moderate in severity in the midsternal left parasternal area with radiation to her right jaw as well as left shoulder and arm.  She has been having associated dyspnea as well as nausea with dry heaves.  No cough or wheezing or hemoptysis.  She denies any worsening leg pain or edema recent travels or surgeries. no dysuria, oliguria or hematuria or flank pain.  No bleeding diathesis.  Upon presentation to the emergency room, blood pressure was 165/96 with otherwise normal vital signs.  Labs revealed hyponatremia and a blood glucose of 616 later came down to 397.  CBC showed hemoconcentration.  Influenza antigens and COVID-19 PCR came back negative.  Two-view chest x-ray no acute cardiopulmonary disease. EKG showed normal sinus rhythm with rate of 88.  The patient was given 40 of aspirin and 1 L bolus of IV lactated Ringer.  She will be admitted to a progressive unit bed for further evaluation and management.  PAST MEDICAL HISTORY:   Past Medical History:  Diagnosis Date  . Anxiety   . Arthritis    "qwhere" (06/14/2014)  . Bipolar disorder ([place])   . CAD (coronary artery disease)    a. 2008 PCI/DES to RCA; b. 2012 PCI: 95%  mRCA s/p PCI/DES; b. 2/16 PCI DES-> 90% dRCA; c. 11/16 PCI: dRCA ISR s/p PCI/DES;  d. 4/18 PCI: p-mRCA 95% s/p PCI/DES; e. 02/2017 Cath: LML nl, LAD nl, D1 40, LCX min irregs, OM1 60, RCA 50-60p/m ISR, 22m/d, patent dRCA stent, RPDA 90ost (fills via L->R collats), EF 55-65%.  . Chronic bronchitis ([place])    "get it q yr"  . Daily headache    "when my blood pressure is up" (06/14/2014)  . Depression   . Diastolic dysfunction    a. 02/2017 Echo: EF 55-60%, no rwma, Gr1 DD, nl RV fxn.  [name] GERD (gastroesophageal reflux disease)   . Heart murmur   . High cholesterol   . History of stomach ulcers   . Hypertension   . Insomnia   . Kidney stones   . Migraine    "haven't had them in a good while; did get them 1-2 times/yr" (06/14/2014)  . Myocardial infarction ([place]) 2008; ~ 2012  . Pneumonia    "get it 1-2 times/year" (06/14/2014)  . RLS (restless legs syndrome)   . Seizures ([place])   . Sleep apnea    "they want me to do the lab but I haven't" (06/14/2014)  . Stroke [facility])    "they say I've had several" (06/14/2014)  . Tobacco abuse   . Type II diabetes mellitus ([place])   . Urinary, incontinence, stress female     PAST SURGICAL HISTORY:   Past Surgical History:  Procedure Laterality Date  . ABDOMINAL HYSTERECTOMY  1984   "I've got 1 ovary left"  . CARDIAC CATHETERIZATION N/A 02/26/2015   Procedure: Left Heart Cath and Coronary Angiography;  Surgeon: Wellington Hampshire, MD;  Location: [place] [facility];  Service: Cardiovascular;  Laterality: N/A;  . CARDIAC CATHETERIZATION N/A 02/26/2015   Procedure: Coronary Stent Intervention;  Surgeon: Wellington Hampshire, MD;  Location: [place] [facility];  Service: Cardiovascular;  Laterality: N/A;  . CESAREAN SECTION  1984  . CORONARY ANGIOPLASTY WITH STENT PLACEMENT  2008; ~ 2012   "1; 1"  . CORONARY BALLOON ANGIOPLASTY N/A 06/09/2017   Procedure: CORONARY BALLOON ANGIOPLASTY;  Surgeon: Nelva Bush, MD;  Location: [place] [facility];  Service:  Cardiovascular;  Laterality: N/A;  . CORONARY STENT INTERVENTION N/A 08/04/2016   Procedure: Coronary Stent Intervention;  Surgeon: Wellington Hampshire, MD;  Location: [place] [facility];  Service: Cardiovascular;  Laterality: N/A;  . DILATION AND CURETTAGE OF UTERUS    . EXTRACORPOREAL SHOCK WAVE LITHOTRIPSY  X 2  . I & D EXTREMITY Left 02/09/2018   Procedure: IRRIGATION AND DEBRIDEMENT EXTREMITY;  Surgeon: Herbert Pun, MD;  Location: [facility] ORS;  Service: General;  Laterality: Left;  . KNEE ARTHROSCOPY Left   . LEFT HEART CATH Bilateral 08/04/2016   Procedure: Left Heart Cath poss PCI;  Surgeon: Wellington Hampshire, MD;  Location: [place] [facility];  Service: Cardiovascular;  Laterality: Bilateral;  . LEFT HEART CATH AND CORONARY ANGIOGRAPHY Left 02/19/2017   Procedure: LEFT HEART CATH AND CORONARY ANGIOGRAPHY;  Surgeon: Minna Merritts, MD;  Location: [place] [facility];  Service: Cardiovascular;  Laterality: Left;  . LEFT HEART CATH AND CORONARY ANGIOGRAPHY Left 06/09/2017   Procedure: LEFT HEART CATH AND CORONARY ANGIOGRAPHY;  Surgeon: Nelva Bush, MD;  Location: [place] [facility];  Service: Cardiovascular;  Laterality: Left;  . LEFT HEART CATHETERIZATION WITH CORONARY ANGIOGRAM N/A 06/15/2014   Procedure: LEFT HEART CATHETERIZATION WITH CORONARY ANGIOGRAM;  Surgeon: Jettie Booze, MD;  Location: [facility] C[facility] LAB;  Service: Cardiovascular;  Laterality: N/A;  . PERCUTANEOUS CORONARY STENT INTERVENTION (PCI-S)  06/15/2014   Procedure: PERCUTANEOUS CORONARY STENT INTERVENTION (PCI-S);  Surgeon: Jettie Booze, MD;  Location: [facility] CATH LAB;  Service: Cardiovascular;;  . TONSILLECTOMY    . TUBAL LIGATION      SOCIAL HISTORY:   Social History   Tobacco Use  . Smoking status: Current Every Day Smoker    Packs/day: 0.25    Years: 35.00    Pack years: 8.75    Types: Cigarettes  . Smokeless tobacco: Never Used  . Tobacco comment: Patches  Substance Use Topics  . Alcohol  use: Yes    Alcohol/week: 0.0 standard drinks    Comment: occ    FAMILY HISTORY:   Family History  Problem Relation Age of Onset  . Lung cancer Mother   . Cirrhosis Father     DRUG ALLERGIES:   Allergies  Allergen Reactions  . Carbamazepine Anaphylaxis and Other (See Comments)    Blood dyscrasia when on with Lithium  . Lithium Other (See Comments)    Blood dyscrasia  . Lodine [Etodolac] Shortness Of Breath, Diarrhea, Nausea And Vomiting and Rash  . Codeine Other (See Comments)    Upset stomach  . Levofloxacin In D5w Swelling and Other (See Comments)    "Salty" sensation in mouth. "Hard time to explain it"  . Methocarbamol Other (See Comments)    Unknown  . Tape Other (  See Comments)    Welps, blister on skin  . Relafen [Nabumetone] Diarrhea, Nausea And Vomiting and Rash    REVIEW OF SYSTEMS:   ROS As per history of present illness. All pertinent systems were reviewed above. Constitutional, HEENT, cardiovascular, respiratory, GI, GU, musculoskeletal, neuro, psychiatric, endocrine, integumentary and hematologic systems were reviewed and are otherwise negative/unremarkable except for positive findings mentioned above in the HPI.   MEDICATIONS AT HOME:   Prior to Admission medications   Medication Sig Start Date End Date Taking? Authorizing Provider  albuterol (PROVENTIL HFA;VENTOLIN HFA) 108 (90 Base) MCG/ACT inhaler Inhale 2 puffs into the lungs every 4 (four) hours as needed for wheezing or shortness of breath.    [provider]  aspirin EC 81 MG EC tablet Take 1 tablet (81 mg total) by mouth daily. 02/27/15   Gladstone Lighter, MD  atorvastatin (LIPITOR) 80 MG tablet Take 1 tablet (80 mg total) by mouth daily. 12/09/19   Rise Mu, PA-C  Blood Glucose Monitoring Suppl W/DEVICE KIT Test 1-2 times daily; E11.65 diagnosis 03/02/15   Rubbie Battiest, RN  busPIRone (BUSPAR) 5 MG tablet 1 po bid prn anxiety 09/15/19   [provider]  clopidogrel (PLAVIX)  75 MG tablet TAKE 1 TABLET BY MOUTH EVERY DAY WITH BREAKFAST. 12/09/19   Dunn, Areta Haber, PA-C  cyclobenzaprine (FLEXERIL) 5 MG tablet Take 5 mg by mouth 3 (three) times daily as needed for muscle spasms.  07/17/16   [provider]  ezetimibe (ZETIA) 10 MG tablet Take 1 tablet (10 mg total) by mouth daily. 03/12/20   Minna Merritts, MD  fluticasone (FLONASE) 50 MCG/ACT nasal spray Place 2 sprays into both nostrils daily.    [provider]  furosemide (LASIX) 20 MG tablet Take 1 tablet (20 mg total) by mouth daily. 12/09/19   Dunn, Areta Haber, PA-C  gabapentin (NEURONTIN) 600 MG tablet Take 900 mg by mouth 4 (four) times daily.     [provider]  liraglutide (VICTOZA) 18 MG/3ML SOPN Inject 1.8 mg into the skin daily.  02/20/17   [provider]  lisinopril (ZESTRIL) 40 MG tablet Take 1 tablet (40 mg total) by mouth daily. 12/09/19   Rise Mu, PA-C  metoprolol succinate (TOPROL-XL) 50 MG 24 hr tablet Take 1 tablet (50 mg total) by mouth in the morning and at bedtime. Take with or immediately following a meal. 03/12/20   Gollan, Kathlene November, MD  nitroGLYCERIN (NITROSTAT) 0.4 MG SL tablet Place under the tongue. 02/28/19   [provider]  pantoprazole (PROTONIX) 40 MG tablet TAKE 1 TABLET(40 MG) BY MOUTH DAILY 12/09/19   Dunn, Areta Haber, PA-C  polyethylene glycol [facility] / GLYCOLAX) packet Take 17 g by mouth daily as needed for mild constipation.    [provider]  QUEtiapine (SEROQUEL) 50 MG tablet  08/05/19   [provider]  ranolazine (RANEXA) 500 MG 12 hr tablet Take 1 tablet (500 mg total) by mouth 2 (two) times daily. 12/09/19   Dunn, Areta Haber, PA-C  senna (SENOKOT) 8.6 MG tablet Senna Lax 8.6 mg tablet  take 2 tablets by mouth NIGHTLY AS NEEDED FOR CONSTIPATION    [provider]  tiZANidine (ZANAFLEX) 2 MG tablet Take by mouth. 08/29/19   [provider]  TRESIBA FLEXTOUCH 200 UNIT/ML SOPN Inject 66 Units into the skin at  bedtime.     [provider]      VITAL SIGNS:  Blood pressure (!) 153/99, pulse  74, temperature 98.9 F (37.2 C), temperature source Oral, resp. rate 18, height $RemoveBe'5\' 2"'UPnLhTips$  (1.575 m), weight 83 kg, SpO2 99 %.  PHYSICAL EXAMINATION:  Physical Exam  GENERAL:  59 y.o.-year-old Caucasian female patient lying in the bed with no acute distress.  EYES: Pupils equal, round, reactive to light and accommodation. No scleral icterus. Extraocular muscles intact.  HEENT: Head atraumatic, normocephalic. Oropharynx and nasopharynx clear.  NECK:  Supple, no jugular venous distention. No thyroid enlargement, no tenderness.  LUNGS: Normal breath sounds bilaterally, no wheezing, rales,rhonchi or crepitation. No use of accessory muscles of respiration.  CARDIOVASCULAR: Regular rate and rhythm, S1, S2 normal. No murmurs, rubs, or gallops.  ABDOMEN: Soft, nondistended, nontender. Bowel sounds present. No organomegaly or mass.  EXTREMITIES: No pedal edema, cyanosis, or clubbing.  NEUROLOGIC: Cranial nerves II through XII are intact. Muscle strength 5/5 in all extremities. Sensation intact. Gait not checked.  PSYCHIATRIC: The patient is alert and oriented x 3.  Normal affect and good eye contact. SKIN: No obvious rash, lesion, or ulcer.   LABORATORY PANEL:   CBC Recent Labs  Lab 04/09/20 1718  WBC 8.3  HGB 15.1*  HCT 45.0  PLT 253   ------------------------------------------------------------------------------------------------------------------  Chemistries  Recent Labs  Lab 04/09/20 1718  NA 131*  K 4.2  CL 98  CO2 21*  GLUCOSE 616*  BUN 12  CREATININE 0.73  CALCIUM 9.7   ------------------------------------------------------------------------------------------------------------------  Cardiac Enzymes No results for input(s): TROPONINI in the last 168  hours. ------------------------------------------------------------------------------------------------------------------  RADIOLOGY:  DG Chest 2 View  Result Date: 04/09/2020 CLINICAL DATA:  Chest pain with shortness of breath. EXAM: CHEST - 2 VIEW COMPARISON:  07/24/2018 FINDINGS: The heart size and mediastinal contours are within normal limits. Both lungs are clear. The visualized skeletal structures are unremarkable. IMPRESSION: No active cardiopulmonary disease. Electronically Signed   By: Constance Holster M.D.   On: 04/09/2020 19:52      IMPRESSION AND PLAN:   1.  Acute coronary syndrome/non-ST elevation MI with history of coronary artery disease status post PCI and stents. -The patient will be admitted to a progressive unit bed. -We will place on IV heparin with bolus and drip. -Continue aspirin as well as Plavix, beta-blocker therapy, Ranexa and high-dose statin therapy. -We will place her on scheduled Nitropaste. -2D echo and cardiology consult will be obtained. -I notified Dr. Kerin Ransom about the patient.  2.  Uncontrolled type 2 diabetes mellitus with nonketotic hyperglycemia. -The patient will be placed on high resistant NovoLog with frequent fingerstick blood glucose coverage. -We will continue her basal coverage. -Metformin will be held off.  3.  Essential hypertension. -We will continue her antihypertensives.  4.  Anxiety and bipolar disorder. -We will continue BuSpar and Seroquel  5.  COPD without exacerbation. -We will continue her Spiriva and place her on as needed duo nebs.  6.  DVT prophylaxis. -The patient will be on IV heparin.   All the records are reviewed and case discussed with ED provider. The plan of care was discussed in details with the patient (and family). I answered all questions. The patient agreed to proceed with the above mentioned plan. Further management will depend upon hospital course.   CODE STATUS: Full code  Status is:  Inpatient  Remains inpatient appropriate because:Ongoing active pain requiring inpatient pain management, Ongoing diagnostic testing needed not appropriate for outpatient work up, Unsafe d/c plan, IV treatments appropriate due to intensity of illness or inability to take PO and Inpatient level of care  appropriate due to severity of illness   Dispo: The patient is from: Home              Anticipated d/c is to: Home              Anticipated d/c date is: 2 days              Patient currently is not medically stable to d/c.   TOTAL TIME TAKING CARE OF THIS PATIENT: 55 minutes.    Christel Mormon M.D on 04/09/2020 at 10:15 PM  [facility]   From 7 PM-7 AM, contact night-coverage [email or web address]  CC: Primary care physician; Mateo Flow, MD

## 2020-04-09 NOTE — ED Triage Notes (Signed)
Pt to ER with c/o midsternal chest pain that radiates into neck and left arm.  Pt reports nausea and SHOB.  Pt reports similar pain before and was seen by cardiologist who told her it was not cardiac and that they felt it was anxiety.  Pt states she took 2 NTG at home without relief.

## 2020-04-09 NOTE — ED Provider Notes (Signed)
[facility] Emergency Department Provider Note ____________________________________________   Event Date/Time   First MD Initiated Contact with Patient 04/09/20 2120     (approximate)  I have reviewed the triage vital signs and the nursing notes.  HISTORY  Chief Complaint Chest Pain   HPI Jennifer [name] is a 59 y.o. femalewho presents to the ED for evaluation of chest pain.   Chart review indicates hx DM on multiple oral agents, HTN, HLD, anxiety.  CAD followed by Dr. Rockey Situ with [facility] cardiology.  Previous multiple stents to her RCA.  Last LHC within our system and 05/2017 with severe single-vessel CAD with in-stent restenosis of the mid RCA.  Patient placed to the ED with intermittent chest pain.  She reports an episode of chest pain that occurred this morning while she was dusting her house that lasted hours before resolving.  She reports that she is chest pain-free right now, as this pain resolved by the time she got to the ED.  She reports taking home anxiolytics without improvement of her symptoms.  She reports taken 2 nitroglycerin tablets, and feeling some improvement of symptoms after the second tablet.  Denies any recent fevers, illnesses, cough, syncope, emesis, abdominal pain.   Past Medical History:  Diagnosis Date  . Anxiety   . Arthritis    "qwhere" (06/14/2014)  . Bipolar disorder ([place])   . CAD (coronary artery disease)    a. 2008 PCI/DES to RCA; b. 2012 PCI: 95% mRCA s/p PCI/DES; b. 2/16 PCI DES-> 90% dRCA; c. 11/16 PCI: dRCA ISR s/p PCI/DES;  d. 4/18 PCI: p-mRCA 95% s/p PCI/DES; e. 02/2017 Cath: LML nl, LAD nl, D1 40, LCX min irregs, OM1 60, RCA 50-60p/m ISR, 20m/d, patent dRCA stent, RPDA 90ost (fills via L->R collats), EF 55-65%.  . Chronic bronchitis ([place])    "get it q yr"  . Daily headache    "when my blood pressure is up" (06/14/2014)  . Depression   . Diastolic dysfunction    a. 02/2017 Echo: EF 55-60%, no rwma, Gr1 DD, nl RV fxn.   [name] GERD (gastroesophageal reflux disease)   . Heart murmur   . High cholesterol   . History of stomach ulcers   . Hypertension   . Insomnia   . Kidney stones   . Migraine    "haven't had them in a good while; did get them 1-2 times/yr" (06/14/2014)  . Myocardial infarction ([place]) 2008; ~ 2012  . Pneumonia    "get it 1-2 times/year" (06/14/2014)  . RLS (restless legs syndrome)   . Seizures ([place])   . Sleep apnea    "they want me to do the lab but I haven't" (06/14/2014)  . Stroke [facility])    "they say I've had several" (06/14/2014)  . Tobacco abuse   . Type II diabetes mellitus ([place])   . Urinary, incontinence, stress female     Patient Active Problem List   Diagnosis Date Noted  . COPD exacerbation ([place]) 07/24/2018  . Chronic obstructive pulmonary disease ([place]) 07/20/2018  . Cellulitis 02/06/2018  . Sepsis ([place]) 02/06/2018  . Abnormal stress test 06/09/2017  . Unstable angina ([place]) 08/03/2016  . Morbid obesity ([place]) 05/02/2016  . Chest pain with moderate risk for cardiac etiology 03/14/2015  . CAD (coronary artery disease)   . Hospital discharge follow-up 03/07/2015  . Poorly controlled type 2 diabetes mellitus with complication ([place]) 09/60/4540  . Hyperlipidemia 06/22/2014  . Stented coronary artery   . Chronic pain syndrome   .  Chest pain at rest 06/14/2014  . Acute bronchitis 06/14/2014  . Tobacco abuse 06/14/2014  . Essential hypertension 06/14/2014  . GERD (gastroesophageal reflux disease) 06/14/2014  . Bipolar disorder ([place]) 06/14/2014  . Chest pain 06/14/2014    Past Surgical History:  Procedure Laterality Date  . ABDOMINAL HYSTERECTOMY  1984   "I've got 1 ovary left"  . CARDIAC CATHETERIZATION N/A 02/26/2015   Procedure: Left Heart Cath and Coronary Angiography;  Surgeon: Wellington Hampshire, MD;  Location: [place] CV LAB;  Service: Cardiovascular;  Laterality: N/A;  . CARDIAC CATHETERIZATION N/A 02/26/2015   Procedure: Coronary Stent Intervention;  Surgeon:  Wellington Hampshire, MD;  Location: [place] CV LAB;  Service: Cardiovascular;  Laterality: N/A;  . CESAREAN SECTION  1984  . CORONARY ANGIOPLASTY WITH STENT PLACEMENT  2008; ~ 2012   "1; 1"  . CORONARY BALLOON ANGIOPLASTY N/A 06/09/2017   Procedure: CORONARY BALLOON ANGIOPLASTY;  Surgeon: Nelva Bush, MD;  Location: [place] CV LAB;  Service: Cardiovascular;  Laterality: N/A;  . CORONARY STENT INTERVENTION N/A 08/04/2016   Procedure: Coronary Stent Intervention;  Surgeon: Wellington Hampshire, MD;  Location: [place] CV LAB;  Service: Cardiovascular;  Laterality: N/A;  . DILATION AND CURETTAGE OF UTERUS    . EXTRACORPOREAL SHOCK WAVE LITHOTRIPSY  X 2  . I & D EXTREMITY Left 02/09/2018   Procedure: IRRIGATION AND DEBRIDEMENT EXTREMITY;  Surgeon: Herbert Pun, MD;  Location: [facility] ORS;  Service: General;  Laterality: Left;  . KNEE ARTHROSCOPY Left   . LEFT HEART CATH Bilateral 08/04/2016   Procedure: Left Heart Cath poss PCI;  Surgeon: Wellington Hampshire, MD;  Location: [place] CV LAB;  Service: Cardiovascular;  Laterality: Bilateral;  . LEFT HEART CATH AND CORONARY ANGIOGRAPHY Left 02/19/2017   Procedure: LEFT HEART CATH AND CORONARY ANGIOGRAPHY;  Surgeon: Minna Merritts, MD;  Location: [place] CV LAB;  Service: Cardiovascular;  Laterality: Left;  . LEFT HEART CATH AND CORONARY ANGIOGRAPHY Left 06/09/2017   Procedure: LEFT HEART CATH AND CORONARY ANGIOGRAPHY;  Surgeon: Nelva Bush, MD;  Location: [place] CV LAB;  Service: Cardiovascular;  Laterality: Left;  . LEFT HEART CATHETERIZATION WITH CORONARY ANGIOGRAM N/A 06/15/2014   Procedure: LEFT HEART CATHETERIZATION WITH CORONARY ANGIOGRAM;  Surgeon: Jettie Booze, MD;  Location: [facility] CATH LAB;  Service: Cardiovascular;  Laterality: N/A;  . PERCUTANEOUS CORONARY STENT INTERVENTION (PCI-S)  06/15/2014   Procedure: PERCUTANEOUS CORONARY STENT INTERVENTION (PCI-S);  Surgeon: Jettie Booze, MD;   Location: [facility] CATH LAB;  Service: Cardiovascular;;  . TONSILLECTOMY    . TUBAL LIGATION      Prior to Admission medications   Medication Sig Start Date End Date Taking? Authorizing Provider  albuterol (PROVENTIL HFA;VENTOLIN HFA) 108 (90 Base) MCG/ACT inhaler Inhale 2 puffs into the lungs every 4 (four) hours as needed for wheezing or shortness of breath.    [provider]  aspirin EC 81 MG EC tablet Take 1 tablet (81 mg total) by mouth daily. 02/27/15   Gladstone Lighter, MD  atorvastatin (LIPITOR) 80 MG tablet Take 1 tablet (80 mg total) by mouth daily. 12/09/19   Rise Mu, PA-C  Blood Glucose Monitoring Suppl W/DEVICE KIT Test 1-2 times daily; E11.65 diagnosis 03/02/15   Rubbie Battiest, RN  busPIRone (BUSPAR) 5 MG tablet 1 po bid prn anxiety 09/15/19   [provider]  clopidogrel (PLAVIX) 75 MG tablet TAKE 1 TABLET BY MOUTH EVERY DAY WITH BREAKFAST. 12/09/19   Rise Mu,  PA-C  cyclobenzaprine (FLEXERIL) 5 MG tablet Take 5 mg by mouth 3 (three) times daily as needed for muscle spasms.  07/17/16   [provider]  ezetimibe (ZETIA) 10 MG tablet Take 1 tablet (10 mg total) by mouth daily. 03/12/20   Antonieta Iba, MD  fluticasone (FLONASE) 50 MCG/ACT nasal spray Place 2 sprays into both nostrils daily.    [provider]  furosemide (LASIX) 20 MG tablet Take 1 tablet (20 mg total) by mouth daily. 12/09/19   Dunn, Raymon Mutton, PA-C  gabapentin (NEURONTIN) 600 MG tablet Take 900 mg by mouth 4 (four) times daily.     [provider]  liraglutide (VICTOZA) 18 MG/3ML SOPN Inject 1.8 mg into the skin daily.  02/20/17   [provider]  lisinopril (ZESTRIL) 40 MG tablet Take 1 tablet (40 mg total) by mouth daily. 12/09/19   Sondra Barges, PA-C  metoprolol succinate (TOPROL-XL) 50 MG 24 hr tablet Take 1 tablet (50 mg total) by mouth in the morning and at bedtime. Take with or immediately following a meal. 03/12/20   Gollan, Tollie Pizza, MD   nitroGLYCERIN (NITROSTAT) 0.4 MG SL tablet Place under the tongue. 02/28/19   [provider]  pantoprazole (PROTONIX) 40 MG tablet TAKE 1 TABLET(40 MG) BY MOUTH DAILY 12/09/19   Dunn, Raymon Mutton, PA-C  polyethylene glycol [facility] / GLYCOLAX) packet Take 17 g by mouth daily as needed for mild constipation.    [provider]  QUEtiapine (SEROQUEL) 50 MG tablet  08/05/19   [provider]  ranolazine (RANEXA) 500 MG 12 hr tablet Take 1 tablet (500 mg total) by mouth 2 (two) times daily. 12/09/19   Dunn, Raymon Mutton, PA-C  senna (SENOKOT) 8.6 MG tablet Senna Lax 8.6 mg tablet  take 2 tablets by mouth NIGHTLY AS NEEDED FOR CONSTIPATION    [provider]  tiZANidine (ZANAFLEX) 2 MG tablet Take by mouth. 08/29/19   [provider]  TRESIBA FLEXTOUCH 200 UNIT/ML SOPN Inject 66 Units into the skin at bedtime.     [provider]    Allergies Carbamazepine, Lithium, Lodine [etodolac], Codeine, Levofloxacin in d5w, Methocarbamol, Tape, and Relafen [nabumetone]  Family History  Problem Relation Age of Onset  . Lung cancer Mother   . Cirrhosis Father     Social History Social History   Tobacco Use  . Smoking status: Current Every Day Smoker    Packs/day: 0.25    Years: 35.00    Pack years: 8.75    Types: Cigarettes  . Smokeless tobacco: Never Used  . Tobacco comment: Patches  Substance Use Topics  . Alcohol use: Yes    Alcohol/week: 0.0 standard drinks    Comment: occ  . Drug use: Yes    Types: Marijuana    Review of Systems  Constitutional: No fever/chills Eyes: No visual changes. ENT: No sore throat. Cardiovascular: Positive for chest pain. Respiratory: Denies shortness of breath. Gastrointestinal: No abdominal pain.  No nausea, no vomiting.  No diarrhea.  No constipation. Genitourinary: Negative for dysuria. Musculoskeletal: Negative for back pain. Skin: Negative for rash. Neurological: Negative for headaches, focal weakness or  numbness.  ____________________________________________   PHYSICAL EXAM:  VITAL SIGNS: Vitals:   04/09/20 1717 04/09/20 1952  BP: (!) 165/96 (!) 153/99  Pulse: 84 74  Resp: 20 18  Temp: 98.8 F (37.1 C) 98.9 F (37.2 C)  SpO2: 92% 99%     Constitutional: Alert and oriented. Well appearing and in no  acute distress.  Very talkative. Eyes: Conjunctivae are normal. PERRL. EOMI. Head: Atraumatic. Nose: No congestion/rhinnorhea. Mouth/Throat: Mucous membranes are moist.  Oropharynx non-erythematous. Neck: No stridor. No cervical spine tenderness to palpation. Cardiovascular: Normal rate, regular rhythm. Grossly normal heart sounds.  Good peripheral circulation. Respiratory: Normal respiratory effort.  No retractions. Lungs CTAB. Gastrointestinal: Soft , nondistended, nontender to palpation. No CVA tenderness. Musculoskeletal: No lower extremity tenderness nor edema.  No joint effusions. No signs of acute trauma. Neurologic:  Normal speech and language. No gross focal neurologic deficits are appreciated. No gait instability noted. Skin:  Skin is warm, dry and intact. No rash noted. Psychiatric: Mood and affect are normal. Speech and behavior are normal.  ____________________________________________   LABS (all labs ordered are listed, but only abnormal results are displayed)  Labs Reviewed  BASIC METABOLIC PANEL - Abnormal; Notable for the following components:      Result Value   Sodium 131 (*)    CO2 21 (*)    Glucose, Bld 616 (*)    All other components within normal limits  CBC - Abnormal; Notable for the following components:   Hemoglobin 15.1 (*)    All other components within normal limits  CBG MONITORING, ED - Abnormal; Notable for the following components:   Glucose-Capillary 397 (*)    All other components within normal limits  TROPONIN I (HIGH SENSITIVITY) - Abnormal; Notable for the following components:   Troponin I (High Sensitivity) 87 (*)    All other  components within normal limits  RESP PANEL BY RT-PCR (FLU A&B, COVID) [id number]  TROPONIN I (HIGH SENSITIVITY)  TROPONIN I (HIGH SENSITIVITY)   ____________________________________________  12 Lead EKG  Sinus rhythm, rate of 88 bpm.  Normal axis.  Normal intervals.  No evidence of acute ischemia. ____________________________________________  RADIOLOGY  ED MD interpretation: 2 view CXR reviewed by me without evidence of acute cardiopulmonary pathology.  Official radiology report(s): DG Chest 2 View  Result Date: 04/09/2020 CLINICAL DATA:  Chest pain with shortness of breath. EXAM: CHEST - 2 VIEW COMPARISON:  07/24/2018 FINDINGS: The heart size and mediastinal contours are within normal limits. Both lungs are clear. The visualized skeletal structures are unremarkable. IMPRESSION: No active cardiopulmonary disease. Electronically Signed   By: Constance Holster M.D.   On: 04/09/2020 19:52    ____________________________________________   PROCEDURES and INTERVENTIONS  Procedure(s) performed (including Critical Care):  .1-3 Lead EKG Interpretation Performed by: Vladimir Crofts, MD Authorized by: Vladimir Crofts, MD     Interpretation: normal     ECG rate:  72   ECG rate assessment: normal     Rhythm: sinus rhythm     Ectopy: none     Conduction: normal      Medications  lactated ringers bolus 1,000 mL (0 mLs Intravenous Stopped 04/09/20 2128)  aspirin chewable tablet 243 mg (243 mg Oral Given 04/09/20 2207)    ____________________________________________   MDM / ED COURSE   59 year old woman with history of CAD presents to the ED with symptoms of unstable angina and found to have rising troponin necessitating medical observation admission.  Normal vitals on room air.  Exam without evidence of any acute pathology.  Blood work demonstrates hyperglycemia without acidosis, and patient has no symptoms to suggest DKA.  Improving glucose with IV fluids.  Her EKG is nonischemic at her  baseline, but her troponins are rising from 13-87.  Due to her risk factors and increasing troponins, we will admit the patient for observation.  ____________________________________________   FINAL CLINICAL IMPRESSION(S) / ED DIAGNOSES  Final diagnoses:  Unstable angina ([place])  Hyperglycemia  Other chest pain     ED Discharge Orders    None       [place]   Note:  This document was prepared using Dragon voice recognition software and may include unintentional dictation errors.   Vladimir Crofts, MD 04/09/20 2212

## 2020-04-09 NOTE — ED Notes (Signed)
Date and time results received: 04/09/20 2324 (use smartphrase ".now" to insert current time)  Test: Troponin  Critical Value: 244  Name of Provider Notified: Dr. Arville Care   Orders Received? Or Actions Taken?: Provider notified.  Awaiting orders.

## 2020-04-10 ENCOUNTER — Inpatient Hospital Stay (HOSPITAL_COMMUNITY)
Admit: 2020-04-10 | Discharge: 2020-04-10 | Disposition: A | Discharge status: Home / Self Care | Payer: Medicare HMO | Attending: Family Medicine | Admitting: Family Medicine

## 2020-04-10 ENCOUNTER — Encounter
Admission: EM | Disposition: A | Discharge status: Home / Self Care | Payer: Self-pay | Source: Home / Self Care | Attending: Hospitalist

## 2020-04-10 ENCOUNTER — Other Ambulatory Visit: Payer: Self-pay

## 2020-04-10 ENCOUNTER — Encounter: Payer: Self-pay | Admitting: Family Medicine

## 2020-04-10 DIAGNOSIS — Z720 Tobacco use: Secondary | ICD-10-CM

## 2020-04-10 DIAGNOSIS — E785 Hyperlipidemia, unspecified: Secondary | ICD-10-CM

## 2020-04-10 DIAGNOSIS — E669 Obesity, unspecified: Secondary | ICD-10-CM

## 2020-04-10 DIAGNOSIS — E876 Hypokalemia: Secondary | ICD-10-CM

## 2020-04-10 DIAGNOSIS — E1169 Type 2 diabetes mellitus with other specified complication: Secondary | ICD-10-CM

## 2020-04-10 DIAGNOSIS — I25110 Atherosclerotic heart disease of native coronary artery with unstable angina pectoris: Secondary | ICD-10-CM

## 2020-04-10 DIAGNOSIS — R079 Chest pain, unspecified: Secondary | ICD-10-CM

## 2020-04-10 DIAGNOSIS — I200 Unstable angina: Secondary | ICD-10-CM

## 2020-04-10 HISTORY — PX: LEFT HEART CATH AND CORONARY ANGIOGRAPHY: CATH118249

## 2020-04-10 LAB — ECHOCARDIOGRAM COMPLETE
AR max vel: 2.32 cm2
AV Area VTI: 3.88 cm2
AV Area mean vel: 2.62 cm2
AV Mean grad: 4 mmHg
AV Peak grad: 7.6 mmHg
Ao pk vel: 1.38 m/s
Area-P 1/2: 2.77 cm2
Height: 62 in
S' Lateral: 2.34 cm
Weight: 2928 oz

## 2020-04-10 LAB — LIPID PANEL
Cholesterol: 206 mg/dL — ABNORMAL HIGH (ref 0–200)
HDL: 27 mg/dL — ABNORMAL LOW (ref >40)
LDL Cholesterol: UNDETERMINED mg/dL (ref 0–99)
Total CHOL/HDL Ratio: 7.6 RATIO
Triglycerides: 660 mg/dL — ABNORMAL HIGH (ref <150)
VLDL: UNDETERMINED mg/dL (ref 0–40)

## 2020-04-10 LAB — BASIC METABOLIC PANEL
Anion gap: 10 (ref 5–15)
BUN: 16 mg/dL (ref 6–20)
CO2: 22 mmol/L (ref 22–32)
Calcium: 8.7 mg/dL — ABNORMAL LOW (ref 8.9–10.3)
Chloride: 99 mmol/L (ref 98–111)
Creatinine, Ser: 0.78 mg/dL (ref 0.44–1.00)
GFR, Estimated: >60 mL/min (ref >60)
Glucose, Bld: 522 mg/dL (ref 70–99)
Potassium: 3.4 mmol/L — ABNORMAL LOW (ref 3.5–5.1)
Sodium: 131 mmol/L — ABNORMAL LOW (ref 135–145)

## 2020-04-10 LAB — CBC
HCT: 36.4 % (ref 36.0–46.0)
Hemoglobin: 12.3 g/dL (ref 12.0–15.0)
MCH: 31.3 pg (ref 26.0–34.0)
MCHC: 33.8 g/dL (ref 30.0–36.0)
MCV: 92.6 fL (ref 80.0–100.0)
Platelets: 200 10*3/uL (ref 150–400)
RBC: 3.93 MIL/uL (ref 3.87–5.11)
RDW: 13.5 % (ref 11.5–15.5)
WBC: 7.9 10*3/uL (ref 4.0–10.5)
nRBC: 0 % (ref 0.0–0.2)

## 2020-04-10 LAB — TROPONIN I (HIGH SENSITIVITY)
Troponin I (High Sensitivity): 381 ng/L (ref <18)
Troponin I (High Sensitivity): 504 ng/L (ref <18)
Troponin I (High Sensitivity): 776 ng/L (ref <18)
Troponin I (High Sensitivity): 799 ng/L (ref <18)

## 2020-04-10 LAB — GLUCOSE, CAPILLARY
Glucose-Capillary: 200 mg/dL — ABNORMAL HIGH (ref 70–99)
Glucose-Capillary: 273 mg/dL — ABNORMAL HIGH (ref 70–99)
Glucose-Capillary: 386 mg/dL — ABNORMAL HIGH (ref 70–99)

## 2020-04-10 LAB — CBG MONITORING, ED
Glucose-Capillary: 437 mg/dL — ABNORMAL HIGH (ref 70–99)
Glucose-Capillary: 219 mg/dL — ABNORMAL HIGH (ref 70–99)
Glucose-Capillary: 86 mg/dL (ref 70–99)

## 2020-04-10 LAB — HIV ANTIBODY (ROUTINE TESTING W REFLEX): HIV Screen 4th Generation wRfx: NONREACTIVE

## 2020-04-10 LAB — PROTIME-INR
INR: 0.9 (ref 0.8–1.2)
Prothrombin Time: 12 seconds (ref 11.4–15.2)

## 2020-04-10 LAB — HEMOGLOBIN A1C
Hgb A1c MFr Bld: 14.6 % — ABNORMAL HIGH (ref 4.8–5.6)
Mean Plasma Glucose: 372.32 mg/dL

## 2020-04-10 LAB — LDL CHOLESTEROL, DIRECT: Direct LDL: 127.2 mg/dL — ABNORMAL HIGH (ref 0–99)

## 2020-04-10 LAB — APTT: aPTT: 145 seconds — ABNORMAL HIGH (ref 24–36)

## 2020-04-10 LAB — HEPARIN LEVEL (UNFRACTIONATED)
Heparin Unfractionated: 0.71 IU/mL — ABNORMAL HIGH (ref 0.30–0.70)
Heparin Unfractionated: 0.42 IU/mL (ref 0.30–0.70)

## 2020-04-10 SURGERY — LEFT HEART CATH AND CORONARY ANGIOGRAPHY
Anesthesia: Moderate Sedation

## 2020-04-10 MED ORDER — ASPIRIN 81 MG PO CHEW
CHEWABLE_TABLET | ORAL | Status: AC
  Filled 2020-04-10: qty 1

## 2020-04-10 MED ORDER — PROCHLORPERAZINE EDISYLATE 10 MG/2ML IJ SOLN
10.0000 mg | Freq: Once | INTRAMUSCULAR | Status: AC
  Administered 2020-04-10: 20:00:00 10 mg via INTRAVENOUS
  Filled 2020-04-10: qty 2

## 2020-04-10 MED ORDER — ALPRAZOLAM 0.5 MG PO TABS
ORAL_TABLET | ORAL | Status: AC
  Filled 2020-04-10: qty 1

## 2020-04-10 MED ORDER — INSULIN ASPART 100 UNIT/ML ~~LOC~~ SOLN
10.0000 [IU] | Freq: Once | SUBCUTANEOUS | Status: AC
  Administered 2020-04-10: 10 [IU] via INTRAVENOUS

## 2020-04-10 MED ORDER — IOHEXOL 300 MG/ML  SOLN
INTRAMUSCULAR | Status: DC | PRN
  Administered 2020-04-10: 16:00:00 40 mL

## 2020-04-10 MED ORDER — FENTANYL CITRATE (PF) 100 MCG/2ML IJ SOLN
INTRAMUSCULAR | Status: AC
  Filled 2020-04-10: qty 2

## 2020-04-10 MED ORDER — SODIUM CHLORIDE 0.9 % IV SOLN: 250.0000 mL | INTRAVENOUS | Status: DC | PRN

## 2020-04-10 MED ORDER — POTASSIUM CHLORIDE 10 MEQ/100ML IV SOLN: 10.0000 meq | INTRAVENOUS | Status: AC

## 2020-04-10 MED ORDER — VERAPAMIL HCL 2.5 MG/ML IV SOLN
INTRAVENOUS | Status: AC
  Filled 2020-04-10: qty 2

## 2020-04-10 MED ORDER — SODIUM CHLORIDE 0.9% FLUSH
3.0000 mL | Freq: Two times a day (BID) | INTRAVENOUS | Status: DC
  Administered 2020-04-10 – 2020-04-11 (×2): 3 mL via INTRAVENOUS

## 2020-04-10 MED ORDER — HEPARIN SODIUM (PORCINE) 1000 UNIT/ML IJ SOLN
INTRAMUSCULAR | Status: DC | PRN
  Administered 2020-04-10: 4000 [IU] via INTRAVENOUS

## 2020-04-10 MED ORDER — MORPHINE SULFATE (PF) 2 MG/ML IV SOLN: 2.0000 mg | Freq: Once | INTRAVENOUS | Status: DC

## 2020-04-10 MED ORDER — INSULIN ASPART 100 UNIT/ML ~~LOC~~ SOLN: 15.0000 [IU] | Freq: Once | SUBCUTANEOUS | Status: DC

## 2020-04-10 MED ORDER — ENOXAPARIN SODIUM 40 MG/0.4ML ~~LOC~~ SOLN
40.0000 mg | SUBCUTANEOUS | Status: DC
  Administered 2020-04-11: 08:00:00 40 mg via SUBCUTANEOUS
  Filled 2020-04-10 (×3): qty 0.4

## 2020-04-10 MED ORDER — SODIUM CHLORIDE 0.9 % IV SOLN: INTRAVENOUS | Status: AC

## 2020-04-10 MED ORDER — INSULIN ASPART 100 UNIT/ML ~~LOC~~ SOLN: 0.0000 [IU] | SUBCUTANEOUS | Status: DC

## 2020-04-10 MED ORDER — TRAMADOL HCL 50 MG PO TABS
50.0000 mg | ORAL_TABLET | Freq: Once | ORAL | Status: AC
  Administered 2020-04-10: 04:00:00 50 mg via ORAL
  Filled 2020-04-10: qty 1

## 2020-04-10 MED ORDER — LIDOCAINE HCL (PF) 1 % IJ SOLN
INTRAMUSCULAR | Status: AC
  Filled 2020-04-10: qty 30

## 2020-04-10 MED ORDER — INSULIN ASPART 100 UNIT/ML ~~LOC~~ SOLN
11.0000 [IU] | Freq: Once | SUBCUTANEOUS | Status: AC
  Administered 2020-04-10: 04:00:00 11 [IU] via SUBCUTANEOUS
  Filled 2020-04-10: qty 1

## 2020-04-10 MED ORDER — HEPARIN SODIUM (PORCINE) 1000 UNIT/ML IJ SOLN
INTRAMUSCULAR | Status: AC
  Filled 2020-04-10: qty 1

## 2020-04-10 MED ORDER — CLOPIDOGREL BISULFATE 75 MG PO TABS
ORAL_TABLET | ORAL | Status: AC
  Administered 2020-04-10: 14:00:00 75 mg
  Filled 2020-04-10: qty 1

## 2020-04-10 MED ORDER — SODIUM CHLORIDE 0.9% FLUSH: 3.0000 mL | INTRAVENOUS | Status: DC | PRN

## 2020-04-10 MED ORDER — SODIUM CHLORIDE 0.9 % IV SOLN
250.0000 mL | INTRAVENOUS | Status: DC | PRN
Start: 2020-04-10 — End: 2020-04-11

## 2020-04-10 MED ORDER — VERAPAMIL HCL 2.5 MG/ML IV SOLN
INTRAVENOUS | Status: DC | PRN
  Administered 2020-04-10: 2.5 mg via INTRA_ARTERIAL

## 2020-04-10 MED ORDER — DIPHENHYDRAMINE HCL 50 MG/ML IJ SOLN
25.0000 mg | Freq: Once | INTRAMUSCULAR | Status: AC
  Administered 2020-04-10: 20:00:00 25 mg via INTRAVENOUS
  Filled 2020-04-10: qty 1

## 2020-04-10 MED ORDER — MIDAZOLAM HCL 2 MG/2ML IJ SOLN
INTRAMUSCULAR | Status: DC | PRN
  Administered 2020-04-10: 1 mg via INTRAVENOUS

## 2020-04-10 MED ORDER — HEPARIN (PORCINE) IN NACL 1000-0.9 UT/500ML-% IV SOLN
INTRAVENOUS | Status: DC | PRN
  Administered 2020-04-10: 500 mL

## 2020-04-10 MED ORDER — SODIUM CHLORIDE 0.9% FLUSH: 3.0000 mL | Freq: Two times a day (BID) | INTRAVENOUS | Status: DC

## 2020-04-10 MED ORDER — NITROGLYCERIN 0.4 MG SL SUBL
SUBLINGUAL_TABLET | SUBLINGUAL | Status: AC
  Filled 2020-04-10: qty 1

## 2020-04-10 MED ORDER — QUETIAPINE FUMARATE 25 MG PO TABS
100.0000 mg | ORAL_TABLET | Freq: Every day | ORAL | Status: DC
  Administered 2020-04-10: 20:00:00 100 mg via ORAL
  Filled 2020-04-10: qty 4

## 2020-04-10 MED ORDER — ASPIRIN 81 MG PO CHEW
81.0000 mg | CHEWABLE_TABLET | ORAL | Status: AC
  Administered 2020-04-10: 14:00:00 81 mg via ORAL

## 2020-04-10 MED ORDER — NICOTINE 21 MG/24HR TD PT24
21.0000 mg | MEDICATED_PATCH | Freq: Every day | TRANSDERMAL | Status: DC
  Administered 2020-04-10 – 2020-04-11 (×2): 21 mg via TRANSDERMAL
  Filled 2020-04-10 (×3): qty 1

## 2020-04-10 MED ORDER — INSULIN ASPART 100 UNIT/ML ~~LOC~~ SOLN
0.0000 [IU] | Freq: Three times a day (TID) | SUBCUTANEOUS | Status: DC
  Administered 2020-04-11: 12:00:00 11 [IU] via SUBCUTANEOUS
  Administered 2020-04-11: 08:00:00 15 [IU] via SUBCUTANEOUS
  Filled 2020-04-10 (×2): qty 1

## 2020-04-10 MED ORDER — MIDAZOLAM HCL 2 MG/2ML IJ SOLN
INTRAMUSCULAR | Status: AC
  Filled 2020-04-10: qty 2

## 2020-04-10 MED ORDER — ASPIRIN 81 MG PO CHEW: 81.0000 mg | CHEWABLE_TABLET | Freq: Once | ORAL | Status: DC

## 2020-04-10 MED ORDER — FENTANYL CITRATE (PF) 100 MCG/2ML IJ SOLN
INTRAMUSCULAR | Status: DC | PRN
  Administered 2020-04-10: 50 ug via INTRAVENOUS

## 2020-04-10 MED ORDER — LIDOCAINE HCL (PF) 1 % IJ SOLN
INTRAMUSCULAR | Status: DC | PRN
  Administered 2020-04-10: 2 mL

## 2020-04-10 MED ORDER — SODIUM CHLORIDE 0.9 % IV SOLN: INTRAVENOUS | Status: DC

## 2020-04-10 SURGICAL SUPPLY — 7 items
CATH INFINITI 5FR JK (CATHETERS) ×3 IMPLANT
DEVICE RAD TR BAND REGULAR (VASCULAR PRODUCTS) ×3 IMPLANT
GLIDESHEATH SLEND SS 6F .021 (SHEATH) ×3 IMPLANT
GUIDEWIRE INQWIRE 1.5J.035X260 (WIRE) ×1 IMPLANT
INQWIRE 1.5J .035X260CM (WIRE) ×3
KIT MANI 3VAL PERCEP (MISCELLANEOUS) ×3 IMPLANT
PACK CARDIAC CATH (CUSTOM PROCEDURE TRAY) ×3 IMPLANT

## 2020-04-10 NOTE — ED Notes (Signed)
Pt denies current CP. Does affirm headache from nitro paste. Meds given. Pt alert and oriented x4. Pt has extensive cardiac hx with 5 previous heart attacks and multiple stents placed. Pt states each time it has presented similarly. Pt states her presentation this visit was the same.   Pt CBG has been difficult to manage here. Direct orders from admitting MD and NP with minimal change to CBG. Pt has not had any carb intake in at least 6 hrs. Pt given ice chips and sips of water with meds.   Pt wanted to sleep and was having difficulty with anxiety/restlessness. Given PRN ambien, pt went to sleep without issue afterwards.   Pt did have new onset of RUQ abd pain about 30-45 min after tramadol admin. Pt given zofran with no relief. Pt does state hx of gallstones, but did not require surgery.   Admitting NP kept posted of repeat trop values.

## 2020-04-10 NOTE — ED Notes (Signed)
Admitting NP messaged regarding pt CBG, awaiting response. Also mentioned pt request for additional pain meds due to nitro headache

## 2020-04-10 NOTE — Progress Notes (Signed)
ANTICOAGULATION CONSULT NOTE - Initial Consult  Pharmacy Consult for Heparin Indication: chest pain/ACS  Allergies  Allergen Reactions  . Carbamazepine Anaphylaxis and Other (See Comments)    Blood dyscrasia when on with Lithium  . Lithium Other (See Comments)    Blood dyscrasia  . Lodine [Etodolac] Shortness Of Breath, Diarrhea, Nausea And Vomiting and Rash  . Codeine Other (See Comments)    Upset stomach  . Levofloxacin In D5w Swelling and Other (See Comments)    "Salty" sensation in mouth. "Hard time to explain it"  . Methocarbamol Other (See Comments)    Unknown  . Tape Other (See Comments)    Welps, blister on skin  . Relafen [Nabumetone] Diarrhea, Nausea And Vomiting and Rash    Patient Measurements: Height: 5\' 2"  (157.5 cm) Weight: 83 kg (183 lb) IBW/kg (Calculated) : 50.1 HEPARIN DW (KG): 68.7  Vital Signs: Temp: 98.9 F (37.2 C) (12/20 1952) Temp Source: Oral (12/20 1952) BP: 167/90 (12/20 2349) Pulse Rate: 89 (12/20 2349)  Labs: Recent Labs    04/09/20 1718 04/09/20 1957 04/09/20 2235  HGB 15.1*  --   --   HCT 45.0  --   --   PLT 253  --   --   CREATININE 0.73  --   --   TROPONINIHS 13 87* 244*    Estimated Creatinine Clearance: 77.5 mL/min (by C-G formula based on SCr of 0.73 mg/dL).   Medical History: Past Medical History:  Diagnosis Date  . Anxiety   . Arthritis    "qwhere" (06/14/2014)  . Bipolar disorder ([facility])   . CAD (coronary artery disease)    a. 2008 PCI/DES to RCA; b. 2012 PCI: 95% mRCA s/p PCI/DES; b. 2/16 PCI DES-> 90% dRCA; c. 11/16 PCI: dRCA ISR s/p PCI/DES;  d. 4/18 PCI: p-mRCA 95% s/p PCI/DES; e. 02/2017 Cath: LML nl, LAD nl, D1 40, LCX min irregs, OM1 60, RCA 50-60p/m ISR, 14m/d, patent dRCA stent, RPDA 90ost (fills via L->R collats), EF 55-65%.  . Chronic bronchitis ([facility])    "get it q yr"  . Daily headache    "when my blood pressure is up" (06/14/2014)  . Depression   . Diastolic dysfunction    a. 02/2017 Echo: EF 55-60%, no  rwma, Gr1 DD, nl RV fxn.  03/2017 GERD (gastroesophageal reflux disease)   . Heart murmur   . High cholesterol   . History of stomach ulcers   . Hypertension   . Insomnia   . Kidney stones   . Migraine    "haven't had them in a good while; did get them 1-2 times/yr" (06/14/2014)  . Myocardial infarction ([facility]) 2008; ~ 2012  . Pneumonia    "get it 1-2 times/year" (06/14/2014)  . RLS (restless legs syndrome)   . Seizures ([facility])   . Sleep apnea    "they want me to do the lab but I haven't" (06/14/2014)  . Stroke [facility])    "they say I've had several" (06/14/2014)  . Tobacco abuse   . Type II diabetes mellitus ([facility])   . Urinary, incontinence, stress female     Medications:  (Not in a hospital admission)   Assessment: No anticoagulants noted PTA.  Baseline labs ordered.   Goal of Therapy:  Heparin level 0.3-0.7 units/ml Monitor platelets by anticoagulation protocol: Yes   Plan:  Heparin 4000 units bolus x 1 then infusion at 1000 units/hr Check HL ~ 6 hours after heparin started  06/16/2014 A 04/10/2020,12:54 AM

## 2020-04-10 NOTE — Consult Note (Addendum)
Cardiology Consultation:   Patient ID: Jennifer [name] MRN: [id number]; DOB: Jun 15, 1960  Admit date: 04/09/2020 Date of Consult: 04/10/2020  Primary Care Provider: Mateo Flow, MD [facility] Cardiologist: Jennifer Rogue, MD  [place] Electrophysiologist:  None    Patient Profile:   Jennifer [name] is a 59 y.o. female with a hx of CAD s/p multiple prior stents as below, diastolic dysfunction, DM2, hypertension, hyperlipidemia, sleep apnea not on CPAP, ongoing tobacco use, PAD, chronic pain, obesity, recurrent nephrolithiasis, anxiety, depression, and who is being seen today for the evaluation of unstable angina at the request of Jennifer [name].  History of Present Illness:   Jennifer [name] is a 59 year old female with PMH as above and extensive history of ischemic workup and catheterizations over the past several years as detailed directly below. She is a current smoker, though states she is trying to cut back and smoking Ultras and less frequently. She reports a significant history of GI issues with daily diarrhea and a long history of anxiety.  2008:  --LHC s/p DES to RCA in 2008.   2010:  --MPI for recurrent CP 2010 showed prior inferior infarct, peri-infarct ischemia. --LHC showed 95%s mRCA, diffuse 60%s in the pRDPA. DES to [facility].   2016: Recurrent CP and transferred from [facility] to [facility].  --[place] 06/15/2014 with patent but small LM, mild dz LAD / branches, mild to moderate dz in LCx /branches, 90%s RCA s/p DES, LVEDP 28 mmHg.  --MPI 02/23/2015 for recurrent CP /palpitations, ruled intermediate risk study. --LHC 02/26/2015 with severe one-vessel CAD & severe ISR in the proximal segment of the previously placed stent in the dRCA.  DES to [facility] for ISR. Mildly elevated LVEDP. --Stress for nonexertional CP 03/19/2015 ruled low risk.  2018:  --Admitted to [facility] 07/2016 with UA/NSTEM, peak troponin 0 0.12. --07/2016 echo EF 60 to 65%, NRWMA. --07/2016 LHC with severe 1v CAD affecting the  RCA with multiple previous stenting. There was felt to be a plaque rupture within the p-m stent, felt the culprit for her UA. DES to p-mRCA within the previously placed stent. 60% dz in the m-d segment, left to be treated medically. --Reported UA in office 02/18/2017 --02/19/2017 LHC D1 40%s, LCx minor irreg, OM1 60%s, p-m RCA 50-60%s with ISR in the proximal region, m-d RCA 50%, RPDA 90% filled by collaterals from third septal, LVEF 55 to 65%. Medical management advised. --02/24/2017 echo EF 55-60%, NRWMA.   2019:  --MPI with large region of inferior wall ischemia as well as inferior lateral wall ischemia, EF 50%, lateral wall hypokinesis, high risk scan. --LHC 06/09/2017 with severe single-vessel CAD with focal 95% ISR of the mRCA where 2 layers of the stent were already placed. There was moderate stenosis of the m-d RCA (unchanged from prior cath) and high-grade dz involving the ostial/proximal PDA. Mild to moderate nonobstructive CAD involving the LCA. Mild elevation of LV filling pressure. She underwent successful PTCA to ISR of the mRCA with reduction of stenosis from 95% to 0%. Lifelong DAPT recommended.  She was last seen in the office 03/12/2020 by her primary cardiologist, Dr. Rockey [name]. No medication changes or further ischemic work-up was recommended with follow-up in 1 year.   She reports continued stressors in her life since then. She states that she lives with her son and daughter-in-law and is responsible for her grandchild, for whom she had to call child services for an earlier period of time. She also reports significant GI stress, reporting daily diarrhea. In addition,  she reports ongoing anxiety. She continues to smoke. Over the last 6 to 9 months, she reports substernal chest pain that is described as a pressure with also sharp pain. She usually reports taking an ibuprofen (which she is aware she should not take), ASA, and nitro with this CP with relief in the past. This severity of pain  has progressed over the last few months. In addition, the duration of the pain has progressed.   She decided to present to [facility] 04/09/2020 after her longest episode of CP, which was concerning to her. She reports dusting a room when the CP started, continuing for the next 15 minutes. CP was described as again substernal in nature and both pressure and sharp pain. CP radiated up her bilateral neck and resulted in numbness in her ears. In addition, she reported radiation down her left arm with associated numbness and heaviness in her arms. CP was noted to be pleuritic and slightly TTP with reported erythema also of her chest. CP further described as similar to prior to her caths above. Associated symptoms included diaphoresis, shortness of breath, coughing, and pounding/racing heart rate. She also noted significant nausea without emesis. She reported taking 2 sublingual nitro, which did not alleviate her pain. Her neighbor came over with Xanax and also provided her with this medication to attempt to alleviate her anxiety. When sx continued, she decided to present to [facility].   In the ED, initial vitals significant for hypertension with BP 165/96, 92% on room air. Initial labs showed sodium 131, potassium 4.2, creatinine 0.73, BUN 12 ,hyperglycemia with glucose 616 and DM2 with A1C 14.6, HS Tn 13  87  504. Hemoglobin 15.1 with hematocrit 45.0. CXR without active cardiopulmonary disease.  During consultation today, she reports she is chest pain-free.  Past Medical History:  Diagnosis Date  . Anxiety   . Arthritis    "qwhere" (06/14/2014)  . Bipolar disorder ([place])   . CAD (coronary artery disease)    a. 2008 PCI/DES to RCA; b. 2012 PCI: 95% mRCA s/p PCI/DES; b. 2/16 PCI DES-> 90% dRCA; c. 11/16 PCI: dRCA ISR s/p PCI/DES;  d. 4/18 PCI: p-mRCA 95% s/p PCI/DES; e. 02/2017 Cath: LML nl, LAD nl, D1 40, LCX min irregs, OM1 60, RCA 50-60p/m ISR, 16m/d, patent dRCA stent, RPDA 90ost (fills via L->R collats), EF  55-65%.  . Chronic bronchitis ([place])    "get it q yr"  . Daily headache    "when my blood pressure is up" (06/14/2014)  . Depression   . Diastolic dysfunction    a. 02/2017 Echo: EF 55-60%, no rwma, Gr1 DD, nl RV fxn.  [name] GERD (gastroesophageal reflux disease)   . Heart murmur   . High cholesterol   . History of stomach ulcers   . Hypertension   . Insomnia   . Kidney stones   . Migraine    "haven't had them in a good while; did get them 1-2 times/yr" (06/14/2014)  . Myocardial infarction ([place]) 2008; ~ 2012  . Pneumonia    "get it 1-2 times/year" (06/14/2014)  . RLS (restless legs syndrome)   . Seizures ([place])   . Sleep apnea    "they want me to do the lab but I haven't" (06/14/2014)  . Stroke [facility])    "they say I've had several" (06/14/2014)  . Tobacco abuse   . Type II diabetes mellitus ([place])   . Urinary, incontinence, stress female     Past Surgical History:  Procedure Laterality Date  .  ABDOMINAL HYSTERECTOMY  1984   "I've got 1 ovary left"  . CARDIAC CATHETERIZATION N/A 02/26/2015   Procedure: Left Heart Cath and Coronary Angiography;  Surgeon: Wellington Hampshire, MD;  Location: [place] CV LAB;  Service: Cardiovascular;  Laterality: N/A;  . CARDIAC CATHETERIZATION N/A 02/26/2015   Procedure: Coronary Stent Intervention;  Surgeon: Wellington Hampshire, MD;  Location: [place] CV LAB;  Service: Cardiovascular;  Laterality: N/A;  . CESAREAN SECTION  1984  . CORONARY ANGIOPLASTY WITH STENT PLACEMENT  2008; ~ 2012   "1; 1"  . CORONARY BALLOON ANGIOPLASTY N/A 06/09/2017   Procedure: CORONARY BALLOON ANGIOPLASTY;  Surgeon: Nelva Bush, MD;  Location: [place] CV LAB;  Service: Cardiovascular;  Laterality: N/A;  . CORONARY STENT INTERVENTION N/A 08/04/2016   Procedure: Coronary Stent Intervention;  Surgeon: Wellington Hampshire, MD;  Location: [place] CV LAB;  Service: Cardiovascular;  Laterality: N/A;  . DILATION AND CURETTAGE OF UTERUS    . EXTRACORPOREAL SHOCK WAVE  LITHOTRIPSY  X 2  . I & D EXTREMITY Left 02/09/2018   Procedure: IRRIGATION AND DEBRIDEMENT EXTREMITY;  Surgeon: Herbert Pun, MD;  Location: [facility] ORS;  Service: General;  Laterality: Left;  . KNEE ARTHROSCOPY Left   . LEFT HEART CATH Bilateral 08/04/2016   Procedure: Left Heart Cath poss PCI;  Surgeon: Wellington Hampshire, MD;  Location: [place] CV LAB;  Service: Cardiovascular;  Laterality: Bilateral;  . LEFT HEART CATH AND CORONARY ANGIOGRAPHY Left 02/19/2017   Procedure: LEFT HEART CATH AND CORONARY ANGIOGRAPHY;  Surgeon: Minna Merritts, MD;  Location: [place] CV LAB;  Service: Cardiovascular;  Laterality: Left;  . LEFT HEART CATH AND CORONARY ANGIOGRAPHY Left 06/09/2017   Procedure: LEFT HEART CATH AND CORONARY ANGIOGRAPHY;  Surgeon: Nelva Bush, MD;  Location: [place] CV LAB;  Service: Cardiovascular;  Laterality: Left;  . LEFT HEART CATHETERIZATION WITH CORONARY ANGIOGRAM N/A 06/15/2014   Procedure: LEFT HEART CATHETERIZATION WITH CORONARY ANGIOGRAM;  Surgeon: Jettie Booze, MD;  Location: [facility] CATH LAB;  Service: Cardiovascular;  Laterality: N/A;  . PERCUTANEOUS CORONARY STENT INTERVENTION (PCI-S)  06/15/2014   Procedure: PERCUTANEOUS CORONARY STENT INTERVENTION (PCI-S);  Surgeon: Jettie Booze, MD;  Location: [facility] CATH LAB;  Service: Cardiovascular;;  . TONSILLECTOMY    . TUBAL LIGATION       Home Medications:  Prior to Admission medications   Medication Sig Start Date End Date Taking? Authorizing Provider  albuterol (PROVENTIL HFA;VENTOLIN HFA) 108 (90 Base) MCG/ACT inhaler Inhale 2 puffs into the lungs every 4 (four) hours as needed for wheezing or shortness of breath.   Yes [provider]  aspirin EC 81 MG EC tablet Take 1 tablet (81 mg total) by mouth daily. 02/27/15  Yes Gladstone Lighter, MD  atorvastatin (LIPITOR) 80 MG tablet Take 1 tablet (80 mg total) by mouth daily. 12/09/19  Yes Rise Mu, PA-C  Blood Glucose Monitoring Suppl  W/DEVICE KIT Test 1-2 times daily; E11.65 diagnosis 03/02/15  Yes Doss, Velora Heckler, RN  busPIRone (BUSPAR) 5 MG tablet 1 po bid prn anxiety 09/15/19  Yes [provider]  clopidogrel (PLAVIX) 75 MG tablet TAKE 1 TABLET BY MOUTH EVERY DAY WITH BREAKFAST. 12/09/19  Yes Dunn, Areta Haber, PA-C  cyclobenzaprine (FLEXERIL) 5 MG tablet Take 5 mg by mouth 3 (three) times daily as needed for muscle spasms.  07/17/16  Yes [provider]  ezetimibe (ZETIA) 10 MG tablet Take 1 tablet (10 mg total) by mouth daily. 03/12/20  Yes  Minna Merritts, MD  fluticasone (FLONASE) 50 MCG/ACT nasal spray Place 2 sprays into both nostrils daily.   Yes [provider]  furosemide (LASIX) 20 MG tablet Take 1 tablet (20 mg total) by mouth daily. 12/09/19  Yes Dunn, Areta Haber, PA-C  gabapentin (NEURONTIN) 600 MG tablet Take 900 mg by mouth 4 (four) times daily.    Yes [provider]  liraglutide (VICTOZA) 18 MG/3ML SOPN Inject 1.8 mg into the skin daily.  02/20/17  Yes [provider]  lisinopril (ZESTRIL) 40 MG tablet Take 1 tablet (40 mg total) by mouth daily. 12/09/19  Yes Dunn, Areta Haber, PA-C  metoprolol succinate (TOPROL-XL) 50 MG 24 hr tablet Take 1 tablet (50 mg total) by mouth in the morning and at bedtime. Take with or immediately following a meal. 03/12/20  Yes Gollan, Kathlene November, MD  pantoprazole (PROTONIX) 40 MG tablet TAKE 1 TABLET(40 MG) BY MOUTH DAILY 12/09/19  Yes Dunn, Areta Haber, PA-C  polyethylene glycol (MIRALAX / GLYCOLAX) packet Take 17 g by mouth daily as needed for mild constipation.   Yes [provider]  QUEtiapine (SEROQUEL) 50 MG tablet Take 100-200 mg by mouth at bedtime. 08/05/19  Yes [provider]  ranolazine (RANEXA) 500 MG 12 hr tablet Take 1 tablet (500 mg total) by mouth 2 (two) times daily. 12/09/19  Yes Dunn, Areta Haber, PA-C  senna (SENOKOT) 8.6 MG tablet Senna Lax 8.6 mg tablet  take 2 tablets by mouth NIGHTLY AS NEEDED FOR CONSTIPATION   Yes [provider]  tiZANidine (ZANAFLEX) 2 MG tablet Take 2 mg by mouth at bedtime. 08/29/19  Yes [provider]  TRESIBA FLEXTOUCH 200 UNIT/ML SOPN Inject 66 Units into the skin at bedtime.    Yes [provider]  nitroGLYCERIN (NITROSTAT) 0.4 MG SL tablet Place under the tongue. 02/28/19   [provider]    Inpatient Medications: Scheduled Meds: . aspirin EC  81 mg Oral Daily  . atorvastatin  80 mg Oral Daily  . busPIRone  5 mg Oral BID  . clopidogrel  75 mg Oral Daily  . ezetimibe  10 mg Oral Daily  . fluticasone  2 spray Each Nare Daily  . furosemide  20 mg Oral Daily  . gabapentin  900 mg Oral QHS  . insulin aspart  0-9 Units Subcutaneous Q4H  . insulin aspart  15 Units Subcutaneous Once  . insulin glargine  25 Units Subcutaneous QHS  . liraglutide  1.8 mg Subcutaneous Daily  . lisinopril  40 mg Oral Daily  . metoprolol succinate  50 mg Oral BID WC  .  morphine injection  2 mg Intravenous Once  . nitroGLYCERIN  1 inch Topical Q6H  . pantoprazole  40 mg Oral Daily  . QUEtiapine  100-200 mg Oral QHS  . ranolazine  500 mg Oral BID  . tiZANidine  2 mg Oral QHS   Continuous Infusions: . heparin 950 Units/hr (04/10/20 0700)  . potassium chloride     PRN Meds: acetaminophen, albuterol, ALPRAZolam, cyclobenzaprine, nitroGLYCERIN, ondansetron (ZOFRAN) IV, polyethylene glycol, senna  Allergies:    Allergies  Allergen Reactions  . Carbamazepine Anaphylaxis and Other (See Comments)    Blood dyscrasia when on with Lithium  . Lithium Other (See Comments)    Blood dyscrasia  . Lodine [Etodolac] Shortness Of Breath, Diarrhea, Nausea And Vomiting and Rash  . Codeine Other (See Comments)    Upset stomach  . Levofloxacin In D5w Swelling and Other (See Comments)    "  Salty" sensation in mouth. "Hard time to explain it"  . Methocarbamol Other (See Comments)    Unknown  . Tape Other (See Comments)    Welps, blister on skin  . Relafen [Nabumetone] Diarrhea,  Nausea And Vomiting and Rash    Social History:   Social History   Socioeconomic History  . Marital status: Legally Separated    Spouse name: Not on file  . Number of children: Not on file  . Years of education: Not on file  . Highest education level: Not on file  Occupational History  . Not on file  Tobacco Use  . Smoking status: Current Every Day Smoker    Packs/day: 0.25    Years: 35.00    Pack years: 8.75    Types: Cigarettes  . Smokeless tobacco: Never Used  . Tobacco comment: Patches  Substance and Sexual Activity  . Alcohol use: Yes    Alcohol/week: 0.0 standard drinks    Comment: occ  . Drug use: Yes    Types: Marijuana  . Sexual activity: Not on file  Other Topics Concern  . Not on file  Social History Narrative  . Not on file   Social Determinants of Health   Financial Resource Strain: Not on file  Food Insecurity: Not on file  Transportation Needs: Not on file  Physical Activity: Not on file  Stress: Not on file  Social Connections: Not on file  Intimate Partner Violence: Not on file    Family History:    Family History  Problem Relation Age of Onset  . Lung cancer Mother   . Cirrhosis Father      ROS:  Please see the history of present illness.  Review of Systems  Constitutional: Positive for diaphoresis.  Respiratory: Positive for cough, sputum production and shortness of breath. Negative for hemoptysis.        Reports scant clear sputum  Cardiovascular: Positive for chest pain and leg swelling. Negative for orthopnea and PND.       Pounding HR LEE improved with compression stockings with left lower extremity always greater than right, due to prior injury  Gastrointestinal: Positive for abdominal pain, diarrhea, heartburn and nausea. Negative for constipation and vomiting.  Musculoskeletal: Negative for falls.  Neurological: Negative for dizziness, focal weakness and loss of consciousness.  All other systems reviewed and are negative.    All other ROS reviewed and negative.     Physical Exam/Data:   Vitals:   04/10/20 0600 04/10/20 0648 04/10/20 0700 04/10/20 1000  BP:   100/63 117/74  Pulse: 80     Resp: $Remo'19  12 14  'jnkAz$ Temp:  98.4 F (36.9 C)    TempSrc:  Oral    SpO2: 97%     Weight:      Height:        Intake/Output Summary (Last 24 hours) at 04/10/2020 1104 Last data filed at 04/09/2020 2128 Gross per 24 hour  Intake 1000 ml  Output --  Net 1000 ml   Last 3 Weights 04/09/2020 03/12/2020 12/09/2019  Weight (lbs) 183 lb 183 lb 194 lb 3.2 oz  Weight (kg) 83.008 kg 83.008 kg 88.089 kg     Body mass index is 33.47 kg/m.  General: Obese female, in no acute distress HEENT: normal Lymph: no adenopathy Neck: JVD difficult to assess due to body habitus Endocrine:  No thryomegaly Vascular: No carotid bruits; FA pulses 2+ bilaterally without bruits  Cardiac:  normal S1, S2; RRR; no murmur  Lungs:  Coarse breath sounds bilaterally Abd: Obese, nontender, no hepatomegaly  Ext: Mild bilateral lower extremity edema of right lower extremity, greater moderate lower extremity edema of left lower extremity and noted to be chronic Musculoskeletal:  No deformities, BUE and BLE strength normal and equal Skin: warm and dry  Neuro:  CNs 2-12 intact, no focal abnormalities noted, anxious Psych:  Normal affect, anxious  EKG:  The EKG was personally reviewed and demonstrates:  NSR, 88 bpm, nonspecific changes/baseline wander and no acute ST/T changes Telemetry:  Telemetry was personally reviewed and demonstrates:  NSR - ST, rates in the 90-low 100s  Relevant CV Studies: Pending updated echo, ordered today  06/09/2017 LHC Conclusions: 1. Severe single-vessel coronary artery disease with focal 95% in-stent restenosis of the mid RCA, where two layers of stent are already present. There is also moderate stenosis of the mid/distal RCA (unchanged from prior caths) and high grade disease involving the ostial/proximal segment of the  PDA (small vessel). 2. Mild to moderate, nonobstructive CAD involving the left coronary artery. 3. Mildly elevated left ventricular filling pressure. 4. Successful balloon angioplasty to in-stent restenosis of the mid RCA with reduction of stenosis from 95% to 0%. Recommendations: 1. Admit for overnight observation/extended recovery. 2. Dual antiplatelet therapy with aspirin and clopidogrel through at least 07/2016, ideally lifelong. 3. Aggressive secondary prevention, including smoking cessation. 4. If in-stent restenosis recurs, referral for single vessel CABG, brrachytherapy, or drug-coated balloon angioplasty (not available in the [place]) would need to be considered.  02/2017 Echo - Left ventricle: The cavity size was normal. Systolic function was  normal. The estimated ejection fraction was in the range of 55%  to 60%. Wall motion was normal; there were no regional wall  motion abnormalities. Doppler parameters are consistent with  abnormal left ventricular relaxation (grade 1 diastolic  dysfunction).  - Right ventricle: Systolic function was normal.  - Pulmonary arteries: Systolic pressure was within the normal  range.   Laboratory Data:  High Sensitivity Troponin:   Recent Labs  Lab 04/09/20 1718 04/09/20 1957 04/09/20 2235 04/10/20 0053 04/10/20 0415  TROPONINIHS 13 87* 244* 381* 504*     Chemistry Recent Labs  Lab 04/09/20 1718 04/10/20 0415  NA 131* 131*  K 4.2 3.4*  CL 98 99  CO2 21* 22  GLUCOSE 616* 522*  BUN 12 16  CREATININE 0.73 0.78  CALCIUM 9.7 8.7*  GFRNONAA >60 >60  ANIONGAP 12 10    No results for input(s): PROT, ALBUMIN, AST, ALT, ALKPHOS, BILITOT in the last 168 hours. Hematology Recent Labs  Lab 04/09/20 1718 04/10/20 0415  WBC 8.3 7.9  RBC 4.91 3.93  HGB 15.1* 12.3  HCT 45.0 36.4  MCV 91.6 92.6  MCH 30.8 31.3  MCHC 33.6 33.8  RDW 13.6 13.5  PLT 253 200   BNPNo results for input(s): BNP, PROBNP in the last 168  hours.  DDimer No results for input(s): DDIMER in the last 168 hours.   Radiology/Studies:  DG Chest 2 View  Result Date: 04/09/2020 CLINICAL DATA:  Chest pain with shortness of breath. EXAM: CHEST - 2 VIEW COMPARISON:  07/24/2018 FINDINGS: The heart size and mediastinal contours are within normal limits. Both lungs are clear. The visualized skeletal structures are unremarkable. IMPRESSION: No active cardiopulmonary disease. Electronically Signed   By: Constance Holster M.D.   On: 04/09/2020 19:52     Assessment and Plan:   NSTEMI CAD in native coronary artery  --No current chest pain. See HPI above for  details. Previous caths as above. Most recent episodes of CP progressive in severity and duration, occurring both at rest and with exertion, and similar to before her previous caths without relief after 2 SL nitro and Xanax. She reports compliance with DAPT/medications. Risk factors for cardiac etiology include previous history of CAD, poorly controlled DM2, current tobacco use, hypertension, and HLD. High-sensitivity troponin 504 and continues to cycle. EKG without acute ST/T changes. Most recent 2018 echo above with normal EF and pending updated echo, ordered by IM. Given CP as described above, as well as her risk factors for cardiac etiology, further ischemic work-up recommended with cardiac catheterization today. Of note, she does have a history of difficulty / poor tolerance with sedation but agreeable to proceed with risk and benefits discussed.  Pending updated echo.  Continue to cycle high-sensitivity troponin until peaked and downtrending.  LHC today with Dr. Fletcher Anon.  The risks [stroke (1 in 1000), death (1 in 54), kidney failure [usually temporary] (1 in 500), bleeding (1 in 200), allergic reaction [possibly serious] (1 in 200)], benefits (diagnostic support and management of coronary artery disease) and alternatives of a cardiac catheterization were discussed in detail with Ms.  Zehring and she is willing to proceed.  NPO prior to cath.  Further recommendations s/p LHC if indicated at that time. Suspect recommendations to include aggressive secondary prevention including smoking cessation and more optimal glycemic control. For now, continue current medications.  Hypokalemia  --Most recent potassium 3.4. Replete with goal 4.0. Check magnesium.  HTN --BP currently well controlled. Continue current medications.   Hyperlipidemia, goal LDL below 70 Hypertriglyceridemia --Poorly controlled. Total cholesterol 206 with LDL unable to be calculated due to triglycerides. Recommend LDL goal below 70. Consider addition of Lovaza. Consider PCSK9 inhibitors, given her medications currently include atorvastatin 80 mg and Zetia 10 mg.  Tobacco use/COPD --Complete cessation recommended. Discussed risks of continued smoking today. She reports that she desires to cut back on smoking.  Obesity --Heart healthy diet and increased exercise advised. Weight loss advised.  DM2, poorly controlled --Hemoglobin A1c of 14.6 and not well controlled. Per IM, PCP. Glycemic control recommended for risk factor modification.  Chronic diarrhea --Per IM, PCP.      TIMI Risk Score for Unstable Angina or Non-ST Elevation MI:   The patient's TIMI risk score is 5, which indicates a 26% risk of all cause mortality, new or recurrent myocardial infarction or need for urgent revascularization in the next 14 days.           For questions or updates, please contact [place] Please consult [email or web address] for contact info under    Signed, Arvil Chaco, PA-C  04/10/2020 11:04 AM

## 2020-04-10 NOTE — Progress Notes (Signed)
Patient arrived from ER, awake/alert x4.  Lungs CTA, heart sounds regular. Patient with h/o x5 stents, denies chest discomfort at present. Verbalizes understanding of procedure to be performed.

## 2020-04-10 NOTE — ED Notes (Signed)
Pt reports RUQ pain, stating "it feels deep, like a knot, right under my ribs." 7/10. Zofran given per recent tramadol admin with no relief.   NP messaged regarding pt trop, potassium, blood glucose, new RUQ abd pain, and BP. Orders received for insulin and analgesia.   Per NP, pt will consult with cardiology in the AM. Does not want anything additional done for trop or potassium. No orders on BP

## 2020-04-10 NOTE — Progress Notes (Signed)
*  PRELIMINARY RESULTS* Echocardiogram 2D Echocardiogram has been performed.  Jennifer [name] 04/10/2020, 9:23 AM

## 2020-04-10 NOTE — Progress Notes (Signed)
ANTICOAGULATION CONSULT NOTE - Initial Consult  Pharmacy Consult for Heparin Indication: chest pain/ACS  Allergies  Allergen Reactions  . Carbamazepine Anaphylaxis and Other (See Comments)    Blood dyscrasia when on with Lithium  . Lithium Other (See Comments)    Blood dyscrasia  . Lodine [Etodolac] Shortness Of Breath, Diarrhea, Nausea And Vomiting and Rash  . Codeine Other (See Comments)    Upset stomach  . Levofloxacin In D5w Swelling and Other (See Comments)    "Salty" sensation in mouth. "Hard time to explain it"  . Methocarbamol Other (See Comments)    Unknown  . Tape Other (See Comments)    Welps, blister on skin  . Relafen [Nabumetone] Diarrhea, Nausea And Vomiting and Rash    Patient Measurements: Height: 5\' 2"  (157.5 cm) Weight: 83 kg (183 lb) IBW/kg (Calculated) : 50.1 HEPARIN DW (KG): 68.7  Vital Signs: Temp: 98.4 F (36.9 C) (12/21 0648) Temp Source: Oral (12/21 0648) BP: 103/81 (12/21 0530) Pulse Rate: 80 (12/21 0600)  Labs: Recent Labs    04/09/20 1718 04/09/20 1957 04/09/20 2235 04/10/20 0053 04/10/20 0415  HGB 15.1*  --   --   --  12.3  HCT 45.0  --   --   --  36.4  PLT 253  --   --   --  200  APTT  --   --   --   --  145*  LABPROT  --   --   --   --  12.0  INR  --   --   --   --  0.9  HEPARINUNFRC  --   --   --   --  0.71*  CREATININE 0.73  --   --   --  0.78  TROPONINIHS 13   < > 244* 381* 504*   < > = values in this interval not displayed.    Estimated Creatinine Clearance: 77.5 mL/min (by C-G formula based on SCr of 0.78 mg/dL).   Medical History: Past Medical History:  Diagnosis Date  . Anxiety   . Arthritis    "qwhere" (06/14/2014)  . Bipolar disorder (HCC)   . CAD (coronary artery disease)    a. 2008 PCI/DES to RCA; b. 2012 PCI: 95% mRCA s/p PCI/DES; b. 2/16 PCI DES-> 90% dRCA; c. 11/16 PCI: dRCA ISR s/p PCI/DES;  d. 4/18 PCI: p-mRCA 95% s/p PCI/DES; e. 02/2017 Cath: LML nl, LAD nl, D1 40, LCX min irregs, OM1 60, RCA 50-60p/m  ISR, 79m/d, patent dRCA stent, RPDA 90ost (fills via L->R collats), EF 55-65%.  . Chronic bronchitis (HCC)    "get it q yr"  . Daily headache    "when my blood pressure is up" (06/14/2014)  . Depression   . Diastolic dysfunction    a. 02/2017 Echo: EF 55-60%, no rwma, Gr1 DD, nl RV fxn.  03/2017 GERD (gastroesophageal reflux disease)   . Heart murmur   . High cholesterol   . History of stomach ulcers   . Hypertension   . Insomnia   . Kidney stones   . Migraine    "haven't had them in a good while; did get them 1-2 times/yr" (06/14/2014)  . Myocardial infarction (HCC) 2008; ~ 2012  . Pneumonia    "get it 1-2 times/year" (06/14/2014)  . RLS (restless legs syndrome)   . Seizures (HCC)   . Sleep apnea    "they want me to do the lab but I haven't" (06/14/2014)  . Stroke [facility])    "  they say I've had several" (06/14/2014)  . Tobacco abuse   . Type II diabetes mellitus (HCC)   . Urinary, incontinence, stress female     Medications:  (Not in a hospital admission)   Assessment: No anticoagulants noted PTA.  Baseline labs ordered.   Goal of Therapy:  Heparin level 0.3-0.7 units/ml Monitor platelets by anticoagulation protocol: Yes   Plan:  Heparin 4000 units bolus x 1 then infusion at 1000 units/hr Check HL ~ 6 hours after heparin started  1221 0415 HL 0.71, barely above goal range.  Will decrease Heparin to 950 units/hr and recheck HL in 6 hours  Margo Aye, [place] A 04/10/2020,6:57 AM

## 2020-04-10 NOTE — Progress Notes (Signed)
PROGRESS NOTE    Jennifer [name]  PPI:[id number] DOB: 30-Sep-1960 DOA: 04/09/2020 PCP: Althea Grimmer, MD    Assessment & Plan:   Active Problems:   Unstable angina (HCC)   Jennifer [name]  is a 59 y.o. female with a known history of coronary artery disease status post PCI and stents, hypertension, COPD, diastolic dysfunction, dyslipidemia, and anxiety and bipolar disorder, presented to the emergency room with acute onset of intermittent chest pain which has been going on over the last several months and today has been constant with no relief after sublingual nitroglycerin.  She describes it as pressure there is moderate in severity in the midsternal left parasternal area with radiation to her right jaw as well as left shoulder and arm.  She has been having associated dyspnea as well as nausea with dry heaves.    1. non-ST elevation MI with history of coronary artery disease status post PCI and stents. --started on IV heparin --trop up to 700's --Echo unremarkable with normal LVEF Plan: --left heart cath today -Continue aspirin as well as Plavix, beta-blocker therapy, Ranexa and high-dose statin therapy.  2.  poorly controlled type 2 diabetes mellitus  --A1c 14.6 --Hold home metformin --cont Lantus as 25u nightly --SSI TID  3.  Essential hypertension. --cont home lasix, lisinopril, Toprol  4.  Anxiety and bipolar disorder. --cont home Buspar and seroquel  5.  COPD without exacerbation. -not on daily inhalers.  Headache --2/2 nitro --migraine cocktail with benadryl and compazine   Current smoker --nicotine patch  Hypokalemia --replete PRN   DVT prophylaxis: Lovenox SQ Code Status: Full code  Family Communication:  Status is: inpatient Dispo:   The patient is from: home Anticipated d/c is to: home Anticipated d/c date is: tomorrow Patient currently is not medically stable to d/c due to: s/p heart cath, monitor overnight per cards.   Subjective and  Interval History:  Pt complained of a headache from all the nitro she was getting.  No more chest pain.  No dyspnea.    Went for heart cath today.   Objective: Vitals:   04/10/20 1700 04/10/20 1715 04/10/20 1809 04/10/20 1925  BP: (!) 142/87 139/79 115/79 137/77  Pulse: 71 74 74 78  Resp: 18 18 18    Temp:   98.3 F (36.8 C) 97.6 F (36.4 C)  TempSrc:    Oral  SpO2: 100% 100% 96% 92%  Weight:   87.5 kg   Height:   5\' 2"  (1.575 m)     Intake/Output Summary (Last 24 hours) at 04/10/2020 1931 Last data filed at 04/09/2020 2128 Gross per 24 hour  Intake 1000 ml  Output --  Net 1000 ml   Filed Weights   04/09/20 1706 04/10/20 1425 04/10/20 1809  Weight: 83 kg 83 kg 87.5 kg    Examination:   Constitutional: NAD, AAOx3 HEENT: conjunctivae and lids normal, EOMI CV: RRR, No cyanosis.   RESP: clear, normal respiratory effort, on RA Extremities: No effusions, edema in BLE SKIN: warm, dry and intact Neuro: II - XII grossly intact.   Psych: Normal mood and affect.  Appropriate judgement and reason   Data Reviewed: I have personally reviewed following labs and imaging studies  CBC: Recent Labs  Lab 04/09/20 1718 04/10/20 0415  WBC 8.3 7.9  HGB 15.1* 12.3  HCT 45.0 36.4  MCV 91.6 92.6  PLT 253 200   Basic Metabolic Panel: Recent Labs  Lab 04/09/20 1718 04/10/20 0415  NA 131* 131*  K 4.2  3.4*  CL 98 99  CO2 21* 22  GLUCOSE 616* 522*  BUN 12 16  CREATININE 0.73 0.78  CALCIUM 9.7 8.7*   GFR: Estimated Creatinine Clearance: 79.7 mL/min (by C-G formula based on SCr of 0.78 mg/dL). Liver Function Tests: No results for input(s): AST, ALT, ALKPHOS, BILITOT, PROT, ALBUMIN in the last 168 hours. No results for input(s): LIPASE, AMYLASE in the last 168 hours. No results for input(s): AMMONIA in the last 168 hours. Coagulation Profile: Recent Labs  Lab 04/10/20 0415  INR 0.9   Cardiac Enzymes: No results for input(s): CKTOTAL, CKMB, CKMBINDEX, TROPONINI in the  last 168 hours. BNP (last 3 results) No results for input(s): PROBNP in the last 8760 hours. HbA1C: Recent Labs    04/09/20 0053  HGBA1C 14.6*   CBG: Recent Labs  Lab 04/10/20 0303 04/10/20 0645 04/10/20 0902 04/10/20 1420 04/10/20 1635  GLUCAP 437* 219* 86 200* 273*   Lipid Profile: Recent Labs    04/10/20 0415  CHOL 206*  HDL 27*  LDLCALC UNABLE TO CALCULATE IF TRIGLYCERIDE OVER 400 mg/dL  TRIG 259*  CHOLHDL 7.6  LDLDIRECT 127.2*   Thyroid Function Tests: No results for input(s): TSH, T4TOTAL, FREET4, T3FREE, THYROIDAB in the last 72 hours. Anemia Panel: No results for input(s): VITAMINB12, FOLATE, FERRITIN, TIBC, IRON, RETICCTPCT in the last 72 hours. Sepsis Labs: No results for input(s): PROCALCITON, LATICACIDVEN in the last 168 hours.  Recent Results (from the past 240 hour(s))  Resp Panel by RT-PCR (Flu A&B, Covid) Nasopharyngeal Swab     Status: None   Collection Time: 04/09/20 10:35 PM   Specimen: Nasopharyngeal Swab; Nasopharyngeal(NP) swabs in vial transport medium  Result Value Ref Range Status   SARS Coronavirus 2 by RT PCR NEGATIVE NEGATIVE Final    Comment: (NOTE) SARS-CoV-2 target nucleic acids are NOT DETECTED.  The SARS-CoV-2 RNA is generally detectable in upper respiratory specimens during the acute phase of infection. The lowest concentration of SARS-CoV-2 viral copies this assay can detect is 138 copies/mL. A negative result does not preclude SARS-Cov-2 infection and should not be used as the sole basis for treatment or other patient management decisions. A negative result may occur with  improper specimen collection/handling, submission of specimen other than nasopharyngeal swab, presence of viral mutation(s) within the areas targeted by this assay, and inadequate number of viral copies(<138 copies/mL). A negative result must be combined with clinical observations, patient history, and epidemiological information. The expected result is  Negative.  Fact Sheet for Patients:  BloggerCourse.com  Fact Sheet for Healthcare Providers:  SeriousBroker.it  This test is no t yet approved or cleared by the [place] FDA and  has been authorized for detection and/or diagnosis of SARS-CoV-2 by FDA under an Emergency Use Authorization (EUA). This EUA will remain  in effect (meaning this test can be used) for the duration of the COVID-19 declaration under Section 564(b)(1) of the Act, 21 U.S.C.section 360bbb-3(b)(1), unless the authorization is terminated  or revoked sooner.       Influenza A by PCR NEGATIVE NEGATIVE Final   Influenza B by PCR NEGATIVE NEGATIVE Final    Comment: (NOTE) The Xpert Xpress SARS-CoV-2/FLU/RSV plus assay is intended as an aid in the diagnosis of influenza from Nasopharyngeal swab specimens and should not be used as a sole basis for treatment. Nasal washings and aspirates are unacceptable for Xpert Xpress SARS-CoV-2/FLU/RSV testing.  Fact Sheet for Patients: BloggerCourse.com  Fact Sheet for Healthcare Providers: SeriousBroker.it  This test is not yet  approved or cleared by the [place] and has been authorized for detection and/or diagnosis of SARS-CoV-2 by FDA under an Emergency Use Authorization (EUA). This EUA will remain in effect (meaning this test can be used) for the duration of the COVID-19 declaration under Section 564(b)(1) of the Act, 21 U.S.C. section 360bbb-3(b)(1), unless the authorization is terminated or revoked.  Performed at [facility], [address], [place], [place] [address]       Radiology Studies: DG Chest 2 View  Result Date: 04/09/2020 CLINICAL DATA:  Chest pain with shortness of breath. EXAM: CHEST - 2 VIEW COMPARISON:  07/24/2018 FINDINGS: The heart size and mediastinal contours are within normal limits. Both lungs are clear. The  visualized skeletal structures are unremarkable. IMPRESSION: No active cardiopulmonary disease. Electronically Signed   By: Katherine Mantle M.D.   On: 04/09/2020 19:52   CARDIAC CATHETERIZATION  Result Date: 04/10/2020  1st Diag lesion is 40% stenosed.  1st Mrg lesion is 25% stenosed.  Non-stenotic Dist RCA lesion was previously treated.  Mid RCA lesion is 40% stenosed.  Ost RPDA to RPDA lesion is 80% stenosed.  Prox RCA to Mid RCA lesion is 20% stenosed.  1.  Significant one-vessel coronary artery disease with patent stents in the proximal/mid RCA as well as distal RCA with mild in-stent restenosis.  Moderate stenosis in the mid to distal RCA and significant stenosis in a small diameter right PDA is unchanged from most recent catheterization and if anything the 2 lesions look better.  Mild disease affecting the left system. 2.  Left ventricular angiography was not performed.  EF was normal by echo.  Mildly elevated left ventricular end-diastolic pressure. Recommendations: I do not see a clear culprit for the patient's symptoms of unstable angina.  She does have small vessel disease but it actually appears better than most recent angiography in 2018.  I recommend continuing medical therapy.  I do suspect that anxiety is significantly contributing to her symptoms.   ECHOCARDIOGRAM COMPLETE  Result Date: 04/10/2020    ECHOCARDIOGRAM REPORT   Patient Name:   Jennifer [name] Date of Exam: 04/10/2020 Medical Rec #:  [id number]      Height:       62.0 in Accession #:    [id number]     Weight:       183.0 lb Date of Birth:  Feb 04, 1961     BSA:          1.841 m Patient Age:    59 years       BP:           100/63 mmHg Patient Gender: F              HR:           78 bpm. Exam Location:  [facility] Procedure: 2D Echo, Cardiac Doppler and Color Doppler Indications:     Elevated troponin  History:         Patient has prior history of Echocardiogram examinations, most                  recent 02/24/2017. CAD,  Signs/Symptoms:Murmur; Risk                  Factors:Hypertension and Diabetes.  Sonographer:     Cristela Blue RDCS (AE) Referring Phys:  [id number] Vernetta Honey MANSY Diagnosing Phys: [name] MD IMPRESSIONS  1. Left ventricular ejection fraction, by estimation, is 60 to 65%. The left ventricle has normal  function. The left ventricle has no regional wall motion abnormalities. There is mild left ventricular hypertrophy. Left ventricular diastolic parameters were normal.  2. Right ventricular systolic function is normal. The right ventricular size is normal. Tricuspid regurgitation signal is inadequate for assessing PA pressure.  3. The mitral valve is normal in structure. No evidence of mitral valve regurgitation. No evidence of mitral stenosis.  4. The aortic valve is normal in structure. Aortic valve regurgitation is not visualized. Mild aortic valve sclerosis is present, with no evidence of aortic valve stenosis.  5. The inferior vena cava is dilated in size with >50% respiratory variability, suggesting right atrial pressure of 8 mmHg. FINDINGS  Left Ventricle: Left ventricular ejection fraction, by estimation, is 60 to 65%. The left ventricle has normal function. The left ventricle has no regional wall motion abnormalities. The left ventricular internal cavity size was normal in size. There is  mild left ventricular hypertrophy. Left ventricular diastolic parameters were normal. Right Ventricle: The right ventricular size is normal. No increase in right ventricular wall thickness. Right ventricular systolic function is normal. Tricuspid regurgitation signal is inadequate for assessing PA pressure. Left Atrium: Left atrial size was normal in size. Right Atrium: Right atrial size was normal in size. Pericardium: There is no evidence of pericardial effusion. Mitral Valve: The mitral valve is normal in structure. No evidence of mitral valve regurgitation. No evidence of mitral valve stenosis. Tricuspid Valve: The  tricuspid valve is normal in structure. Tricuspid valve regurgitation is not demonstrated. No evidence of tricuspid stenosis. Aortic Valve: The aortic valve is normal in structure. Aortic valve regurgitation is not visualized. Mild aortic valve sclerosis is present, with no evidence of aortic valve stenosis. Aortic valve mean gradient measures 4.0 mmHg. Aortic valve peak gradient measures 7.6 mmHg. Aortic valve area, by VTI measures 3.88 cm. Pulmonic Valve: The pulmonic valve was normal in structure. Pulmonic valve regurgitation is not visualized. No evidence of pulmonic stenosis. Aorta: The aortic root is normal in size and structure. Venous: The inferior vena cava is dilated in size with greater than 50% respiratory variability, suggesting right atrial pressure of 8 mmHg. IAS/Shunts: No atrial level shunt detected by color flow Doppler.  LEFT VENTRICLE PLAX 2D LVIDd:         4.00 cm  Diastology LVIDs:         2.34 cm  LV e' medial:    6.09 cm/s LV PW:         1.16 cm  LV E/e' medial:  12.7 LV IVS:        1.19 cm  LV e' lateral:   7.62 cm/s LVOT diam:     2.10 cm  LV E/e' lateral: 10.1 LV SV:         87 LV SV Index:   47 LVOT Area:     3.46 cm  RIGHT VENTRICLE RV Basal diam:  2.98 cm RV S prime:     10.90 cm/s TAPSE (M-mode): 3.6 cm LEFT ATRIUM             Index       RIGHT ATRIUM          Index LA diam:        2.80 cm 1.52 cm/m  RA Area:     9.09 cm LA Vol (A2C):   15.1 ml 8.20 ml/m  RA Volume:   15.90 ml 8.64 ml/m LA Vol (A4C):   33.5 ml 18.20 ml/m LA Biplane Vol: 24.3 ml 13.20 ml/m  AORTIC VALVE                   PULMONIC VALVE AV Area (Vmax):    2.32 cm    PV Vmax:        0.63 m/s AV Area (Vmean):   2.62 cm    PV Peak grad:   1.6 mmHg AV Area (VTI):     3.88 cm    RVOT Peak grad: 3 mmHg AV Vmax:           138.00 cm/s AV Vmean:          80.900 cm/s AV VTI:            0.223 m AV Peak Grad:      7.6 mmHg AV Mean Grad:      4.0 mmHg LVOT Vmax:         92.40 cm/s LVOT Vmean:        61.300 cm/s LVOT VTI:           0.250 m LVOT/AV VTI ratio: 1.12  AORTA Ao Root diam: 3.20 cm MITRAL VALVE               TRICUSPID VALVE MV Area (PHT): 2.77 cm    TR Peak grad:   10.6 mmHg MV Decel Time: 274 msec    TR Vmax:        163.00 cm/s MV E velocity: 77.10 cm/s MV A velocity: 70.80 cm/s  SHUNTS MV E/A ratio:  1.09        Systemic VTI:  0.25 m                            Systemic Diam: 2.10 cm Lorine BearsMuhammad Arida MD Electronically signed by Lorine BearsMuhammad Arida MD Signature Date/Time: 04/10/2020/11:21:14 AM    Final      Scheduled Meds: . ALPRAZolam      . aspirin      . aspirin  81 mg Oral Once  . aspirin EC  81 mg Oral Daily  . atorvastatin  80 mg Oral Daily  . busPIRone  5 mg Oral BID  . clopidogrel  75 mg Oral Daily  . diphenhydrAMINE  25 mg Intravenous Once  . [START ON 04/11/2020] enoxaparin (LOVENOX) injection  40 mg Subcutaneous Q24H  . ezetimibe  10 mg Oral Daily  . fluticasone  2 spray Each Nare Daily  . furosemide  20 mg Oral Daily  . gabapentin  900 mg Oral QHS  . [START ON 04/11/2020] insulin aspart  0-15 Units Subcutaneous TID WC  . insulin glargine  25 Units Subcutaneous QHS  . lisinopril  40 mg Oral Daily  . metoprolol succinate  50 mg Oral BID WC  .  morphine injection  2 mg Intravenous Once  . nicotine  21 mg Transdermal Daily  . nitroGLYCERIN  1 inch Topical Q6H  . pantoprazole  40 mg Oral Daily  . prochlorperazine  10 mg Intravenous Once  . QUEtiapine  100 mg Oral QHS  . ranolazine  500 mg Oral BID  . sodium chloride flush  3 mL Intravenous Q12H  . tiZANidine  2 mg Oral QHS   Continuous Infusions: . sodium chloride 75 mL/hr at 04/10/20 1623  . sodium chloride       LOS: 1 day     Darlin Priestlyina Chosen Garron, MD [facility] If 7PM-7AM, please contact night-coverage 04/10/2020, 7:31 PM

## 2020-04-10 NOTE — Progress Notes (Signed)
Per Dr. Kirke Corin give patient one s.l. nitro for c/o's jaw /throat discomfort 7/10, effective within , down to 2/10 0.25mg  Xanax po administered for c/o's anxiety, will continue to monitor.  Right radial TR band in place, reviewed post procedure with patient, verbalizes understanding.

## 2020-04-10 NOTE — ED Notes (Signed)
Morphine held per BP

## 2020-04-10 NOTE — Progress Notes (Signed)
Report called to Jessica RN

## 2020-04-10 NOTE — ED Notes (Signed)
Admitting Np messaged regarding continued increasing trop. Awaiting response

## 2020-04-10 NOTE — Progress Notes (Signed)
[place] for Heparin Indication: chest pain/ACS  Allergies  Allergen Reactions  . Carbamazepine Anaphylaxis and Other (See Comments)    Blood dyscrasia when on with Lithium  . Lithium Other (See Comments)    Blood dyscrasia  . Lodine [Etodolac] Shortness Of Breath, Diarrhea, Nausea And Vomiting and Rash  . Codeine Other (See Comments)    Upset stomach  . Levofloxacin In D5w Swelling and Other (See Comments)    "Salty" sensation in mouth. "Hard time to explain it"  . Methocarbamol Other (See Comments)    Unknown  . Tape Other (See Comments)    Welps, blister on skin  . Relafen [Nabumetone] Diarrhea, Nausea And Vomiting and Rash    Patient Measurements: Height: 5' 2" (157.5 cm) Weight: 83 kg (183 lb) IBW/kg (Calculated) : 50.1 HEPARIN DW (KG): 68.7  Vital Signs: Temp: 98 F (36.7 C) (12/21 1117) Temp Source: Oral (12/21 1117) BP: 126/89 (12/21 1117) Pulse Rate: 68 (12/21 1117)  Labs: Recent Labs    04/09/20 1718 04/09/20 1957 04/10/20 0053 04/10/20 0415 04/10/20 1031 04/10/20 1250  HGB 15.1*  --   --  12.3  --   --   HCT 45.0  --   --  36.4  --   --   PLT 253  --   --  200  --   --   APTT  --   --   --  145*  --   --   LABPROT  --   --   --  12.0  --   --   INR  --   --   --  0.9  --   --   HEPARINUNFRC  --   --   --  0.71*  --  0.42  CREATININE 0.73  --   --  0.78  --   --   TROPONINIHS 13   < > 381* 504* 776*  --    < > = values in this interval not displayed.    Estimated Creatinine Clearance: 77.5 mL/min (by C-G formula based on SCr of 0.78 mg/dL).   Medical History: Past Medical History:  Diagnosis Date  . Anxiety   . Arthritis    "qwhere" (06/14/2014)  . Bipolar disorder ([place])   . CAD (coronary artery disease)    a. 2008 PCI/DES to RCA; b. 2012 PCI: 95% mRCA s/p PCI/DES; b. 2/16 PCI DES-> 90% dRCA; c. 11/16 PCI: dRCA ISR s/p PCI/DES;  d. 4/18 PCI: p-mRCA 95% s/p PCI/DES; e. 02/2017 Cath: LML nl, LAD nl, D1  40, LCX min irregs, OM1 60, RCA 50-60p/m ISR, 13md, patent dRCA stent, RPDA 90ost (fills via L->R collats), EF 55-65%.  . Chronic bronchitis ([place]    "get it q yr"  . Daily headache    "when my blood pressure is up" (06/14/2014)  . Depression   . Diastolic dysfunction    a. 02/2017 Echo: EF 55-60%, no rwma, Gr1 DD, nl RV fxn.  .Marland KitchenGERD (gastroesophageal reflux disease)   . Heart murmur   . High cholesterol   . History of stomach ulcers   . Hypertension   . Insomnia   . Kidney stones   . Migraine    "haven't had them in a good while; did get them 1-2 times/yr" (06/14/2014)  . Myocardial infarction ([place] 2008; ~ 2012  . Pneumonia    "get it 1-2 times/year" (06/14/2014)  . RLS (restless legs syndrome)   . Seizures ([place]   .  Sleep apnea    "they want me to do the lab but I haven't" (06/14/2014)  . Stroke [facility])    "they say I've had several" (06/14/2014)  . Tobacco abuse   . Type II diabetes mellitus ([place])   . Urinary, incontinence, stress female     Medications:  Medications Prior to Admission  Medication Sig Dispense Refill Last Dose  . albuterol (PROVENTIL HFA;VENTOLIN HFA) 108 (90 Base) MCG/ACT inhaler Inhale 2 puffs into the lungs every 4 (four) hours as needed for wheezing or shortness of breath.   prn at prn  . aspirin EC 81 MG EC tablet Take 1 tablet (81 mg total) by mouth daily. 30 tablet 2 04/09/2020 at Unknown time  . atorvastatin (LIPITOR) 80 MG tablet Take 1 tablet (80 mg total) by mouth daily. 90 tablet 3 04/09/2020 at Unknown time  . Blood Glucose Monitoring Suppl W/DEVICE KIT Test 1-2 times daily; E11.65 diagnosis 1 each 0 04/09/2020 at Unknown time  . busPIRone (BUSPAR) 5 MG tablet 1 po bid prn anxiety   04/09/2020 at Unknown time  . clopidogrel (PLAVIX) 75 MG tablet TAKE 1 TABLET BY MOUTH EVERY DAY WITH BREAKFAST. 45 tablet 3 04/09/2020 at Unknown time  . cyclobenzaprine (FLEXERIL) 5 MG tablet Take 5 mg by mouth 3 (three) times daily as needed for muscle spasms.    prn  at prn  . ezetimibe (ZETIA) 10 MG tablet Take 1 tablet (10 mg total) by mouth daily. 90 tablet 3 04/09/2020 at Unknown time  . fluticasone (FLONASE) 50 MCG/ACT nasal spray Place 2 sprays into both nostrils daily.   04/09/2020 at Unknown time  . furosemide (LASIX) 20 MG tablet Take 1 tablet (20 mg total) by mouth daily. 90 tablet 3 04/09/2020 at Unknown time  . gabapentin (NEURONTIN) 600 MG tablet Take 900 mg by mouth 4 (four) times daily.   0 04/08/2020 at Unknown time  . liraglutide (VICTOZA) 18 MG/3ML SOPN Inject 1.8 mg into the skin daily.    04/09/2020 at Unknown time  . lisinopril (ZESTRIL) 40 MG tablet Take 1 tablet (40 mg total) by mouth daily. 90 tablet 3 04/09/2020 at Unknown time  . metoprolol succinate (TOPROL-XL) 50 MG 24 hr tablet Take 1 tablet (50 mg total) by mouth in the morning and at bedtime. Take with or immediately following a meal. 180 tablet 3 04/09/2020 at Unknown time  . pantoprazole (PROTONIX) 40 MG tablet TAKE 1 TABLET(40 MG) BY MOUTH DAILY 30 tablet 6 04/09/2020 at Unknown time  . polyethylene glycol (MIRALAX / GLYCOLAX) packet Take 17 g by mouth daily as needed for mild constipation.   prn at prn  . QUEtiapine (SEROQUEL) 50 MG tablet Take 100-200 mg by mouth at bedtime.   04/08/2020 at Unknown time  . ranolazine (RANEXA) 500 MG 12 hr tablet Take 1 tablet (500 mg total) by mouth 2 (two) times daily. 180 tablet 3 04/09/2020 at Unknown time  . senna (SENOKOT) 8.6 MG tablet Senna Lax 8.6 mg tablet  take 2 tablets by mouth NIGHTLY AS NEEDED FOR CONSTIPATION   Past Week at Unknown time  . tiZANidine (ZANAFLEX) 2 MG tablet Take 2 mg by mouth at bedtime.   Past Week at Unknown time  . TRESIBA FLEXTOUCH 200 UNIT/ML SOPN Inject 66 Units into the skin at bedtime.    Past Week at Unknown time  . nitroGLYCERIN (NITROSTAT) 0.4 MG SL tablet Place under the tongue.       Assessment: No anticoagulants noted PTA.  Baseline  labs ordered.   12/21 0415 HL 0.71  12/21 1250 HL 0.42    Goal of Therapy:  Heparin level 0.3-0.7 units/ml Monitor platelets by anticoagulation protocol: Yes   Plan:  Heparin level is therapeutic. Will continue heparin at 950 units/hr. Recheck heparin level in 6 hours. CBC daily while on heparin. Planning a [facility] 12/21.   Oswald Hillock 04/10/2020,2:21 PM

## 2020-04-11 ENCOUNTER — Encounter: Payer: Self-pay | Admitting: Cardiovascular Disease

## 2020-04-11 DIAGNOSIS — I10 Essential (primary) hypertension: Secondary | ICD-10-CM

## 2020-04-11 DIAGNOSIS — F17200 Nicotine dependence, unspecified, uncomplicated: Secondary | ICD-10-CM

## 2020-04-11 LAB — CBC
HCT: 42 % (ref 36.0–46.0)
Hemoglobin: 13.9 g/dL (ref 12.0–15.0)
MCH: 30.8 pg (ref 26.0–34.0)
MCHC: 33.1 g/dL (ref 30.0–36.0)
MCV: 92.9 fL (ref 80.0–100.0)
Platelets: 212 10*3/uL (ref 150–400)
RBC: 4.52 MIL/uL (ref 3.87–5.11)
RDW: 13.7 % (ref 11.5–15.5)
WBC: 7.3 10*3/uL (ref 4.0–10.5)
nRBC: 0 % (ref 0.0–0.2)

## 2020-04-11 LAB — BASIC METABOLIC PANEL
Anion gap: 8 (ref 5–15)
BUN: 17 mg/dL (ref 6–20)
CO2: 24 mmol/L (ref 22–32)
Calcium: 8.8 mg/dL — ABNORMAL LOW (ref 8.9–10.3)
Chloride: 104 mmol/L (ref 98–111)
Creatinine, Ser: 0.77 mg/dL (ref 0.44–1.00)
GFR, Estimated: >60 mL/min (ref >60)
Glucose, Bld: 378 mg/dL — ABNORMAL HIGH (ref 70–99)
Potassium: 4.5 mmol/L (ref 3.5–5.1)
Sodium: 136 mmol/L (ref 135–145)

## 2020-04-11 LAB — GLUCOSE, CAPILLARY
Glucose-Capillary: 411 mg/dL — ABNORMAL HIGH (ref 70–99)
Glucose-Capillary: 343 mg/dL — ABNORMAL HIGH (ref 70–99)
Glucose-Capillary: 267 mg/dL — ABNORMAL HIGH (ref 70–99)

## 2020-04-11 LAB — MAGNESIUM: Magnesium: 2 mg/dL (ref 1.7–2.4)

## 2020-04-11 MED ORDER — BUSPIRONE HCL 10 MG PO TABS
10.0000 mg | ORAL_TABLET | Freq: Two times a day (BID) | ORAL | Status: DC
  Filled 2020-04-11: qty 1

## 2020-04-11 MED ORDER — PROCHLORPERAZINE MALEATE 10 MG PO TABS: 10.0000 mg | ORAL_TABLET | Freq: Three times a day (TID) | ORAL | 0 refills | Status: DC | PRN

## 2020-04-11 MED ORDER — PROCHLORPERAZINE MALEATE 10 MG PO TABS
10.0000 mg | ORAL_TABLET | Freq: Four times a day (QID) | ORAL | Status: DC | PRN
  Administered 2020-04-11: 15:00:00 10 mg via ORAL
  Filled 2020-04-11 (×3): qty 1

## 2020-04-11 MED ORDER — BUSPIRONE HCL 10 MG PO TABS: 10.0000 mg | ORAL_TABLET | Freq: Two times a day (BID) | ORAL | 2 refills | Status: DC

## 2020-04-11 MED ORDER — BLOOD GLUCOSE MONITOR KIT: PACK | 0 refills | Status: AC

## 2020-04-11 MED ORDER — ISOSORBIDE MONONITRATE ER 30 MG PO TB24: 15.0000 mg | ORAL_TABLET | Freq: Every day | ORAL | 1 refills | Status: DC

## 2020-04-11 MED ORDER — INSULIN GLARGINE 100 UNIT/ML ~~LOC~~ SOLN
50.0000 [IU] | Freq: Every day | SUBCUTANEOUS | Status: DC
  Filled 2020-04-11: qty 0.5

## 2020-04-11 MED ORDER — ALPRAZOLAM 0.5 MG PO TABS
0.5000 mg | ORAL_TABLET | Freq: Three times a day (TID) | ORAL | Status: DC | PRN
  Administered 2020-04-11: 12:00:00 0.5 mg via ORAL
  Filled 2020-04-11: qty 1

## 2020-04-11 MED ORDER — ISOSORBIDE MONONITRATE ER 30 MG PO TB24
15.0000 mg | ORAL_TABLET | Freq: Every day | ORAL | Status: DC
  Administered 2020-04-11: 12:00:00 15 mg via ORAL
  Filled 2020-04-11: qty 1

## 2020-04-11 MED ORDER — ALPRAZOLAM 0.5 MG PO TABS: 0.5000 mg | ORAL_TABLET | Freq: Three times a day (TID) | ORAL | 0 refills | Status: DC | PRN

## 2020-04-11 NOTE — Discharge Instructions (Signed)
Fingerstick glucose (sugar) goals for home: Before meals: 80-130 mg/dl 2-Hours after meals: less than 180 mg/dl Hemoglobin M3V goal: 7% or less   Blood Pressure and Imdur (isosorbide) - if your top BP number is less than 120 or bottom BP number less than 60, please HOLD Imdur and do not take that dose for the day.

## 2020-04-11 NOTE — Progress Notes (Signed)
Progress Note  Patient Name: Jennifer [name] Date of Encounter: 04/11/2020  Primary Cardiologist: Julien Nordmannimothy Gollan, MD   Subjective   S/p cardiac catheterization 04/10/2020. Reviewed cath and echo findings.  No current chest pain.   CP earlier this AM that did not persist for long. She ambulated around the room earlier today without chest pain or shortness of breath.  Reports shortness of breath at rest in the setting of forgetting to bring her inhaler with her.  Also reports that her shortness of breath is likely due to her increasing congestion/cough from a respiratory illness.  She reports sputum that is green/yellow in color.  We discussed plan to further work-up of her cough to IM.  We also discussed plan to defer further work-up work-up/treatment of her anxiety to IM/PCP.   Inpatient Medications    Scheduled Meds: . aspirin  81 mg Oral Once  . aspirin EC  81 mg Oral Daily  . atorvastatin  80 mg Oral Daily  . busPIRone  5 mg Oral BID  . clopidogrel  75 mg Oral Daily  . enoxaparin (LOVENOX) injection  40 mg Subcutaneous Q24H  . ezetimibe  10 mg Oral Daily  . fluticasone  2 spray Each Nare Daily  . furosemide  20 mg Oral Daily  . gabapentin  900 mg Oral QHS  . insulin aspart  0-15 Units Subcutaneous TID WC  . insulin glargine  50 Units Subcutaneous QHS  . lisinopril  40 mg Oral Daily  . metoprolol succinate  50 mg Oral BID WC  .  morphine injection  2 mg Intravenous Once  . nicotine  21 mg Transdermal Daily  . nitroGLYCERIN  1 inch Topical Q6H  . pantoprazole  40 mg Oral Daily  . QUEtiapine  100 mg Oral QHS  . ranolazine  500 mg Oral BID  . sodium chloride flush  3 mL Intravenous Q12H  . tiZANidine  2 mg Oral QHS   Continuous Infusions: . sodium chloride     PRN Meds: sodium chloride, acetaminophen, albuterol, ALPRAZolam, cyclobenzaprine, nitroGLYCERIN, ondansetron (ZOFRAN) IV, polyethylene glycol, senna, sodium chloride flush   Vital Signs    Vitals:    04/10/20 1925 04/11/20 0424 04/11/20 0513 04/11/20 0745  BP: 137/77 120/84  119/83  Pulse: 78 68  73  Resp:      Temp: 97.6 F (36.4 C) 98.1 F (36.7 C)  98.4 F (36.9 C)  TempSrc: Oral Oral  Oral  SpO2: 92% 98%  96%  Weight:   83.5 kg   Height:        Intake/Output Summary (Last 24 hours) at 04/11/2020 0945 Last data filed at 04/11/2020 0500 Gross per 24 hour  Intake 0 ml  Output 900 ml  Net -900 ml   Last 3 Weights 04/11/2020 04/10/2020 04/10/2020  Weight (lbs) 184 lb 1.6 oz 193 lb 182 lb 15.7 oz  Weight (kg) 83.507 kg 87.544 kg 83 kg      Telemetry    NSR, 60-70s- Personally Reviewed  ECG    No new tracings- Personally Reviewed  Physical Exam   GEN: No acute distress.   Neck:  JVD difficult to assess due to body habitus Cardiac: RRR, no murmurs, rubs, or gallops.  Right radial arteriotomy site without any signs of hematoma or infection. Respiratory:  Coarse breath sounds bilaterally. GI:  Obese, nontender, non-distended  MS:  No bilateral lower extremity edema; No deformity. Neuro:  Nonfocal  Psych: Anxious  Labs    High Sensitivity  Troponin:   Recent Labs  Lab 04/09/20 2235 04/10/20 0053 04/10/20 0415 04/10/20 1031 04/10/20 1803  TROPONINIHS 244* 381* 504* 776* 799*      Cardiac EnzymesNo results for input(s): TROPONINI in the last 168 hours. No results for input(s): TROPIPOC in the last 168 hours.   Chemistry Recent Labs  Lab 04/09/20 1718 04/10/20 0415 04/11/20 0443  NA 131* 131* 136  K 4.2 3.4* 4.5  CL 98 99 104  CO2 21* 22 24  GLUCOSE 616* 522* 378*  BUN CREATININE 0.73 0.78 0.77  CALCIUM 9.7 8.7* 8.8*  GFRNONAA >60 >60 >60  ANIONGAP Hematology Recent Labs  Lab 04/09/20 1718 04/10/20 0415 04/11/20 0443  WBC 8.3 7.9 7.3  RBC 4.91 3.93 4.52  HGB 15.1* 12.3 13.9  HCT 45.0 36.4 42.0  MCV 91.6 92.6 92.9  MCH 30.8 31.3 30.8  MCHC 33.6 33.8 33.1  RDW 13.6 13.5 13.7  PLT 253 200 212    BNPNo results  for input(s): BNP, PROBNP in the last 168 hours.   DDimer No results for input(s): DDIMER in the last 168 hours.   Radiology    DG Chest 2 View  Result Date: 04/09/2020 CLINICAL DATA:  Chest pain with shortness of breath. EXAM: CHEST - 2 VIEW COMPARISON:  07/24/2018 FINDINGS: The heart size and mediastinal contours are within normal limits. Both lungs are clear. The visualized skeletal structures are unremarkable. IMPRESSION: No active cardiopulmonary disease. Electronically Signed   By: Katherine Mantle M.D.   On: 04/09/2020 19:52   CARDIAC CATHETERIZATION  Result Date: 04/10/2020  1st Diag lesion is 40% stenosed.  1st Mrg lesion is 25% stenosed.  Non-stenotic Dist RCA lesion was previously treated.  Mid RCA lesion is 40% stenosed.  Ost RPDA to RPDA lesion is 80% stenosed.  Prox RCA to Mid RCA lesion is 20% stenosed.  1.  Significant one-vessel coronary artery disease with patent stents in the proximal/mid RCA as well as distal RCA with mild in-stent restenosis.  Moderate stenosis in the mid to distal RCA and significant stenosis in a small diameter right PDA is unchanged from most recent catheterization and if anything the 2 lesions look better.  Mild disease affecting the left system. 2.  Left ventricular angiography was not performed.  EF was normal by echo.  Mildly elevated left ventricular end-diastolic pressure. Recommendations: I do not see a clear culprit for the patient's symptoms of unstable angina.  She does have small vessel disease but it actually appears better than most recent angiography in 2018.  I recommend continuing medical therapy.  I do suspect that anxiety is significantly contributing to her symptoms.   ECHOCARDIOGRAM COMPLETE  Result Date: 04/10/2020    ECHOCARDIOGRAM REPORT   Patient Name:   Jennifer [name] Date of Exam: 04/10/2020 Medical Rec #:  [id number]      Height:       62.0 in Accession #:    [id number]     Weight:       183.0 lb Date of Birth:  Nov 20, 1960      BSA:          1.841 m Patient Age:    59 years       BP:           100/63 mmHg Patient Gender: F              HR:  78 bpm. Exam Location:  [facility] Procedure: 2D Echo, Cardiac Doppler and Color Doppler Indications:     Elevated troponin  History:         Patient has prior history of Echocardiogram examinations, most                  recent 02/24/2017. CAD, Signs/Symptoms:Murmur; Risk                  Factors:Hypertension and Diabetes.  Sonographer:     Cristela Blue RDCS (AE) Referring Phys:  [id number] Vernetta Honey MANSY Diagnosing Phys: [name] MD IMPRESSIONS  1. Left ventricular ejection fraction, by estimation, is 60 to 65%. The left ventricle has normal function. The left ventricle has no regional wall motion abnormalities. There is mild left ventricular hypertrophy. Left ventricular diastolic parameters were normal.  2. Right ventricular systolic function is normal. The right ventricular size is normal. Tricuspid regurgitation signal is inadequate for assessing PA pressure.  3. The mitral valve is normal in structure. No evidence of mitral valve regurgitation. No evidence of mitral stenosis.  4. The aortic valve is normal in structure. Aortic valve regurgitation is not visualized. Mild aortic valve sclerosis is present, with no evidence of aortic valve stenosis.  5. The inferior vena cava is dilated in size with >50% respiratory variability, suggesting right atrial pressure of 8 mmHg. FINDINGS  Left Ventricle: Left ventricular ejection fraction, by estimation, is 60 to 65%. The left ventricle has normal function. The left ventricle has no regional wall motion abnormalities. The left ventricular internal cavity size was normal in size. There is  mild left ventricular hypertrophy. Left ventricular diastolic parameters were normal. Right Ventricle: The right ventricular size is normal. No increase in right ventricular wall thickness. Right ventricular systolic function is normal. Tricuspid regurgitation signal  is inadequate for assessing PA pressure. Left Atrium: Left atrial size was normal in size. Right Atrium: Right atrial size was normal in size. Pericardium: There is no evidence of pericardial effusion. Mitral Valve: The mitral valve is normal in structure. No evidence of mitral valve regurgitation. No evidence of mitral valve stenosis. Tricuspid Valve: The tricuspid valve is normal in structure. Tricuspid valve regurgitation is not demonstrated. No evidence of tricuspid stenosis. Aortic Valve: The aortic valve is normal in structure. Aortic valve regurgitation is not visualized. Mild aortic valve sclerosis is present, with no evidence of aortic valve stenosis. Aortic valve mean gradient measures 4.0 mmHg. Aortic valve peak gradient measures 7.6 mmHg. Aortic valve area, by VTI measures 3.88 cm. Pulmonic Valve: The pulmonic valve was normal in structure. Pulmonic valve regurgitation is not visualized. No evidence of pulmonic stenosis. Aorta: The aortic root is normal in size and structure. Venous: The inferior vena cava is dilated in size with greater than 50% respiratory variability, suggesting right atrial pressure of 8 mmHg. IAS/Shunts: No atrial level shunt detected by color flow Doppler.  LEFT VENTRICLE PLAX 2D LVIDd:         4.00 cm  Diastology LVIDs:         2.34 cm  LV e' medial:    6.09 cm/s LV PW:         1.16 cm  LV E/e' medial:  12.7 LV IVS:        1.19 cm  LV e' lateral:   7.62 cm/s LVOT diam:     2.10 cm  LV E/e' lateral: 10.1 LV SV:         87 LV SV Index:  47 LVOT Area:     3.46 cm  RIGHT VENTRICLE RV Basal diam:  2.98 cm RV S prime:     10.90 cm/s TAPSE (M-mode): 3.6 cm LEFT ATRIUM             Index       RIGHT ATRIUM          Index LA diam:        2.80 cm 1.52 cm/m  RA Area:     9.09 cm LA Vol (A2C):   15.1 ml 8.20 ml/m  RA Volume:   15.90 ml 8.64 ml/m LA Vol (A4C):   33.5 ml 18.20 ml/m LA Biplane Vol: 24.3 ml 13.20 ml/m  AORTIC VALVE                   PULMONIC VALVE AV Area (Vmax):    2.32  cm    PV Vmax:        0.63 m/s AV Area (Vmean):   2.62 cm    PV Peak grad:   1.6 mmHg AV Area (VTI):     3.88 cm    RVOT Peak grad: 3 mmHg AV Vmax:           138.00 cm/s AV Vmean:          80.900 cm/s AV VTI:            0.223 m AV Peak Grad:      7.6 mmHg AV Mean Grad:      4.0 mmHg LVOT Vmax:         92.40 cm/s LVOT Vmean:        61.300 cm/s LVOT VTI:          0.250 m LVOT/AV VTI ratio: 1.12  AORTA Ao Root diam: 3.20 cm MITRAL VALVE               TRICUSPID VALVE MV Area (PHT): 2.77 cm    TR Peak grad:   10.6 mmHg MV Decel Time: 274 msec    TR Vmax:        163.00 cm/s MV E velocity: 77.10 cm/s MV A velocity: 70.80 cm/s  SHUNTS MV E/A ratio:  1.09        Systemic VTI:  0.25 m                            Systemic Diam: 2.10 cm [name] MD Electronically signed by [name] MD Signature Date/Time: 04/10/2020/11:21:14 AM    Final     Cardiac Studies   LHC 04/10/20  1st Diag lesion is 40% stenosed.  1st Mrg lesion is 25% stenosed.  Non-stenotic Dist RCA lesion was previously treated.  Mid RCA lesion is 40% stenosed.  Ost RPDA to RPDA lesion is 80% stenosed.  Prox RCA to Mid RCA lesion is 20% stenosed. 1.  Significant one-vessel coronary artery disease with patent stents in the proximal/mid RCA as well as distal RCA with mild in-stent restenosis.  Moderate stenosis in the mid to distal RCA and significant stenosis in a small diameter right PDA is unchanged from most recent catheterization and if anything the 2 lesions look better.  Mild disease affecting the left system. 2.  Left ventricular angiography was not performed.  EF was normal by echo.  Mildly elevated left ventricular end-diastolic pressure. Recommendations: I do not see a clear culprit for the patient's symptoms of unstable angina.  She does have small vessel  disease but it actually appears better than most recent angiography in 2018.  I recommend continuing medical therapy.  I do suspect that anxiety is significantly  contributing to her symptoms.  Echo 04/10/20 1. Left ventricular ejection fraction, by estimation, is 60 to 65%. The  left ventricle has normal function. The left ventricle has no regional  wall motion abnormalities. There is mild left ventricular hypertrophy.  Left ventricular diastolic parameters  were normal.  2. Right ventricular systolic function is normal. The right ventricular  size is normal. Tricuspid regurgitation signal is inadequate for assessing  PA pressure.  3. The mitral valve is normal in structure. No evidence of mitral valve  regurgitation. No evidence of mitral stenosis.  4. The aortic valve is normal in structure. Aortic valve regurgitation is  not visualized. Mild aortic valve sclerosis is present, with no evidence  of aortic valve stenosis.  5. The inferior vena cava is dilated in size with >50% respiratory  variability, suggesting right atrial pressure of 8 mmHg.    05/2017 LHC Conclusions: 1. Severe single-vessel coronary artery disease with focal 95% in-stent restenosis of the mid RCA, where two layers of stent are already present. There is also moderate stenosis of the mid/distal RCA (unchanged from prior caths) and high grade disease involving the ostial/proximal segment of the PDA (small vessel). 2. Mild to moderate, nonobstructive CAD involving the left coronary artery. 3. Mildly elevated left ventricular filling pressure. 4. Successful balloon angioplasty to in-stent restenosis of the mid RCA with reduction of stenosis from 95% to 0%. Recommendations: 1. Admit for overnight observation/extended recovery. 2. Dual antiplatelet therapy with aspirin and clopidogrel through at least 07/2016, ideally lifelong. 3. Aggressive secondary prevention, including smoking cessation. 4. If in-stent restenosis recurs, referral for single vessel CABG, brrachytherapy, or drug-coated balloon angioplasty (not available in the [place]) would need to be  considered.  Patient Profile     59 y.o. female with hx of CAD s/p multiple prior stents as below, diastolic dysfunction, DM2, hypertension, hyperlipidemia, sleep apnea not on CPAP, ongoing tobacco use, PAD, chronic pain, obesity, recurrent nephrolithiasis, anxiety, depression, and who is being seen today for NSTEMI s/p 04/10/20  LHC.  Assessment & Plan    NSTEMI CAD in native coronary artery  --No current chest pain.  HS Tn 799. EKG without acute ST/T changes.  12/21 echo with nl  EF and NRWMA.  LHC performed 12/21 with significant 1v CAD and patent stents of p-m RCA and d RCA with mild ISR.  Moderate stenosis in the m-d RCA and significant stenosis in small diameter diameter RPDA, unchanged from recent LHC if not improved.  Mildly elevated LVEDP.  No clear culprit for unstable angina.  Suspicion is that anxiety is contributing contributing to her symptoms.  Continue medical therapy with ASA 81 mg, Plavix 75 mg daily, Toprol XL 50 mg twice daily, Ranexa 500 mg twice daily, Zetia 10 mg daily, atorvastatin 80 mg daily.  As needed sublingual nitro for chest pain.  Will add Imdur 15 mg daily per MD for additional antianginal support.  Consider addition of ACE/ARB as renal function and BP allow, given comorbid hypertension and DM2.  Consider treatment of underlying anxiety /optimization of antianxiety medications per PCP/psychiatry.  Follow-up in the office within 1 to 2 weeks.  HTN --BP currently well controlled. Continue current medications.  Consider addition of ACE/ARB as renal function and BP allow given comorbid DM2 as above.        Hyperlipidemia, goal LDL below  70 Hypertriglyceridemia --Poorly controlled. Total cholesterol 206 with LDL unable to be calculated due to triglycerides. Recommend LDL goal below 70. Consider addition of Lovaza. Consider PCSK9 inhibitors, given her medications currently include atorvastatin 80 mg and Zetia 10 mg.  Tobacco use/COPD --Complete cessation  recommended. Discussed risks of continued smoking today. She reports that she desires to cut back on smoking.  Obesity --Heart healthy diet and increased exercise advised. Weight loss advised.  DM2, poorly controlled --Hemoglobin A1c of 14.6 and not well controlled. Per IM, PCP. Glycemic control recommended for risk factor modification.  Cough/Congestion --Per IM, PCP.  For questions or updates, please contact [facility] Please consult [email or web address] for contact info under        Signed, [name], PA-C  04/11/2020, 9:45 AM

## 2020-04-11 NOTE — Progress Notes (Signed)
Discharge instructions explained to pt/ verbalized an understanding/ iv and tele removed/ will transport off unit when ride arrives.  

## 2020-04-11 NOTE — Discharge Summary (Signed)
Physician Discharge Summary  Jennifer [name] JJH:[id number] DOB: 1961-01-08 DOA: 04/09/2020  PCP: Jennifer Flow, MD  Admit date: 04/09/2020 Discharge date: 04/11/2020  Admitted From: home Disposition:  home  Recommendations for Outpatient Follow-up:  1. Follow up with PCP in 1-2 weeks 2. Please obtain BMP/CBC in one week 3. Please follow up with psychiatry for anxiety  4. Please follow up with cardiology as previously scheduled, or sooner if any issues with medications  Home Health: No  Equipment/Devices: None   Discharge Condition: Stable  CODE STATUS: Full  Diet recommendation: Heart Healthy / Carb Modified    Discharge Diagnoses: Active Problems:   Unstable angina (HCC)   Smoking   Primary hypertension    Summary of HPI and Hospital Course:   From Dr. Remonia [name] note of 12/21: "Jennifer [name] a59 y.o.femalewith a known history of coronary artery disease status post PCI and stents, hypertension, COPD, diastolic dysfunction, dyslipidemia, and anxiety and bipolar disorder, presented to the emergency room with acute onset of intermittent chest pain which has been going on over the last several months and today has been constant with no relief after sublingual nitroglycerin. She describes it as pressure there is moderate in severity in the midsternal left parasternal area with radiation to her right jaw as well as left shoulder and arm. She has been having associated dyspnea as well as nausea with dry heaves. "  Admitted for further management of NSTEMI with cardiology consulted.   Non-ST elevation MI / Hx of CAD status post PCI and stents. Anticoagulated with IV heparin Troponin trended up to 799 Echo unremarkable with normal LVEF Left heart cath on 12/21 - significant 1v CAD and patent stents of p-m RCA and d RCA with mild ISR.  Moderate stenosis in the m-d RCA and significant stenosis in small diameter diameter RPDA, unchanged from recent LHC if not improved -Continue  aspirin, Plavix, Toprol-XL, Ranexa and Zetia and Lipitor --PRN sublingual nitro --Resume on ACEI/ARB when BP and renal function will tolerate  Patient clinically improved and stable for discharge home with cardiology follow up in 1-2 weeks   Discharge Instructions   Discharge Instructions    Call MD for:  extreme fatigue   Complete by: As directed    Call MD for:  persistant dizziness or light-headedness   Complete by: As directed    Call MD for:  persistant nausea and vomiting   Complete by: As directed    Call MD for:  severe uncontrolled pain   Complete by: As directed    Call MD for:  temperature >100.4   Complete by: As directed    Diet - low sodium heart healthy   Complete by: As directed    Discharge instructions   Complete by: As directed    Please be sure to resume taking your diabetes medications. I ordered a glucometer so you can check sugars at home.  The order was faxed to your pharmacy earlier today.  I have included contact information for psychiatry here at [facility].  Please call them to request appointment as soon as possible, as I'm not sure how long it takes for them to get new patients in to be seen.   Increase activity slowly   Complete by: As directed      Allergies as of 04/11/2020      Reactions   Carbamazepine Anaphylaxis, Other (See Comments)   Blood dyscrasia when on with Lithium   Lithium Other (See Comments)   Blood dyscrasia   Lodine [  etodolac] Shortness Of Breath, Diarrhea, Nausea And Vomiting, Rash   Codeine Other (See Comments)   Upset stomach   Levofloxacin In D5w Swelling, Other (See Comments)   "Salty" sensation in mouth. "Hard time to explain it"   Methocarbamol Other (See Comments)   Unknown   Tape Other (See Comments)   Welps, blister on skin   Relafen [nabumetone] Diarrhea, Nausea And Vomiting, Rash      Medication List    TAKE these medications   albuterol 108 (90 Base) MCG/ACT inhaler Commonly known as: VENTOLIN HFA Inhale  2 puffs into the lungs every 4 (four) hours as needed for wheezing or shortness of breath.   ALPRAZolam 0.5 MG tablet Commonly known as: XANAX Take 1 tablet (0.5 mg total) by mouth 3 (three) times daily as needed for anxiety.   aspirin 81 MG EC tablet Take 1 tablet (81 mg total) by mouth daily.   atorvastatin 80 MG tablet Commonly known as: LIPITOR Take 1 tablet (80 mg total) by mouth daily.   blood glucose meter kit and supplies Kit Dispense based on patient and insurance preference. Use up to four times daily as directed. (FOR ICD-9 250.00, 250.01).   Blood Glucose Monitoring Suppl w/Device Kit Test 1-2 times daily; E11.65 diagnosis   busPIRone 10 MG tablet Commonly known as: BUSPAR Take 1 tablet (10 mg total) by mouth 2 (two) times daily. What changed:   medication strength  See the new instructions.   clopidogrel 75 MG tablet Commonly known as: PLAVIX TAKE 1 TABLET BY MOUTH EVERY DAY WITH BREAKFAST.   cyclobenzaprine 5 MG tablet Commonly known as: FLEXERIL Take 5 mg by mouth 3 (three) times daily as needed for muscle spasms.   ezetimibe 10 MG tablet Commonly known as: ZETIA Take 1 tablet (10 mg total) by mouth daily.   fluticasone 50 MCG/ACT nasal spray Commonly known as: FLONASE Place 2 sprays into both nostrils daily.   furosemide 20 MG tablet Commonly known as: LASIX Take 1 tablet (20 mg total) by mouth daily.   gabapentin 600 MG tablet Commonly known as: NEURONTIN Take 900 mg by mouth 4 (four) times daily.   isosorbide mononitrate 30 MG 24 hr tablet Commonly known as: IMDUR Take 0.5 tablets (15 mg total) by mouth daily. Start taking on: April 12, 2020   liraglutide 18 MG/3ML Sopn Commonly known as: VICTOZA Inject 1.8 mg into the skin daily.   lisinopril 40 MG tablet Commonly known as: ZESTRIL Take 1 tablet (40 mg total) by mouth daily.   metoprolol succinate 50 MG 24 hr tablet Commonly known as: TOPROL-XL Take 1 tablet (50 mg total) by  mouth in the morning and at bedtime. Take with or immediately following a meal.   nitroGLYCERIN 0.4 MG SL tablet Commonly known as: NITROSTAT Place under the tongue.   pantoprazole 40 MG tablet Commonly known as: PROTONIX TAKE 1 TABLET(40 MG) BY MOUTH DAILY   polyethylene glycol 17 g packet Commonly known as: MIRALAX / GLYCOLAX Take 17 g by mouth daily as needed for mild constipation.   prochlorperazine 10 MG tablet Commonly known as: COMPAZINE Take 1 tablet (10 mg total) by mouth every 8 (eight) hours as needed for nausea or vomiting (HEADACHE).   QUEtiapine 50 MG tablet Commonly known as: SEROQUEL Take 100-200 mg by mouth at bedtime.   ranolazine 500 MG 12 hr tablet Commonly known as: RANEXA Take 1 tablet (500 mg total) by mouth 2 (two) times daily.   senna 8.6 MG tablet Commonly  known as: SENOKOT Senna Lax 8.6 mg tablet  take 2 tablets by mouth NIGHTLY AS NEEDED FOR CONSTIPATION   tiZANidine 2 MG tablet Commonly known as: ZANAFLEX Take 2 mg by mouth at bedtime.   Tyler Aas FlexTouch 200 UNIT/ML FlexTouch Pen Generic drug: insulin degludec Inject 66 Units into the skin at bedtime.       Follow-up Information    [facility]. Call.   Why: pt requests new psychiatrist for management of her anxiety       Jennifer Flow, MD. Schedule an appointment as soon as possible for a visit in 1 week(s).   Specialty: Family Medicine Contact information: [address] [place] [address] [phone]        Minna Merritts, MD .   Specialty: Cardiology Contact information: [address] [place] [address] [phone]              Allergies  Allergen Reactions  . Carbamazepine Anaphylaxis and Other (See Comments)    Blood dyscrasia when on with Lithium  . Lithium Other (See Comments)    Blood dyscrasia  . Lodine [Etodolac] Shortness Of Breath, Diarrhea, Nausea And Vomiting and Rash  . Codeine Other  (See Comments)    Upset stomach  . Levofloxacin In D5w Swelling and Other (See Comments)    "Salty" sensation in mouth. "Hard time to explain it"  . Methocarbamol Other (See Comments)    Unknown  . Tape Other (See Comments)    Welps, blister on skin  . Relafen [Nabumetone] Diarrhea, Nausea And Vomiting and Rash    Consultations:  Cardiology    Procedures/Studies: DG Chest 2 View  Result Date: 04/09/2020 CLINICAL DATA:  Chest pain with shortness of breath. EXAM: CHEST - 2 VIEW COMPARISON:  07/24/2018 FINDINGS: The heart size and mediastinal contours are within normal limits. Both lungs are clear. The visualized skeletal structures are unremarkable. IMPRESSION: No active cardiopulmonary disease. Electronically Signed   By: Constance Holster M.D.   On: 04/09/2020 19:52   CARDIAC CATHETERIZATION  Result Date: 04/10/2020  1st Diag lesion is 40% stenosed.  1st Mrg lesion is 25% stenosed.  Non-stenotic Dist RCA lesion was previously treated.  Mid RCA lesion is 40% stenosed.  Ost RPDA to RPDA lesion is 80% stenosed.  Prox RCA to Mid RCA lesion is 20% stenosed.  1.  Significant one-vessel coronary artery disease with patent stents in the proximal/mid RCA as well as distal RCA with mild in-stent restenosis.  Moderate stenosis in the mid to distal RCA and significant stenosis in a small diameter right PDA is unchanged from most recent catheterization and if anything the 2 lesions look better.  Mild disease affecting the left system. 2.  Left ventricular angiography was not performed.  EF was normal by echo.  Mildly elevated left ventricular end-diastolic pressure. Recommendations: I do not see a clear culprit for the patient's symptoms of unstable angina.  She does have small vessel disease but it actually appears better than most recent angiography in 2018.  I recommend continuing medical therapy.  I do suspect that anxiety is significantly contributing to her symptoms.   ECHOCARDIOGRAM  COMPLETE  Result Date: 04/10/2020    ECHOCARDIOGRAM REPORT   Patient Name:   Jennifer [name] Date of Exam: 04/10/2020 Medical Rec #:  [id number]      Height:       62.0 in Accession #:    [id number]     Weight:  183.0 lb Date of Birth:  06/12/1960     BSA:          1.841 m Patient Age:    59 years       BP:           100/63 mmHg Patient Gender: F              HR:           78 bpm. Exam Location:  [facility] Procedure: 2D Echo, Cardiac Doppler and Color Doppler Indications:     Elevated troponin  History:         Patient has prior history of Echocardiogram examinations, most                  recent 02/24/2017. CAD, Signs/Symptoms:Murmur; Risk                  Factors:Hypertension and Diabetes.  Sonographer:     Sherrie Sport RDCS (AE) Referring Phys:  [id number] Arvella Merles MANSY Diagnosing Phys: Kathlyn Sacramento MD IMPRESSIONS  1. Left ventricular ejection fraction, by estimation, is 60 to 65%. The left ventricle has normal function. The left ventricle has no regional wall motion abnormalities. There is mild left ventricular hypertrophy. Left ventricular diastolic parameters were normal.  2. Right ventricular systolic function is normal. The right ventricular size is normal. Tricuspid regurgitation signal is inadequate for assessing PA pressure.  3. The mitral valve is normal in structure. No evidence of mitral valve regurgitation. No evidence of mitral stenosis.  4. The aortic valve is normal in structure. Aortic valve regurgitation is not visualized. Mild aortic valve sclerosis is present, with no evidence of aortic valve stenosis.  5. The inferior vena cava is dilated in size with >50% respiratory variability, suggesting right atrial pressure of 8 mmHg. FINDINGS  Left Ventricle: Left ventricular ejection fraction, by estimation, is 60 to 65%. The left ventricle has normal function. The left ventricle has no regional wall motion abnormalities. The left ventricular internal cavity size was normal in size. There is  mild left  ventricular hypertrophy. Left ventricular diastolic parameters were normal. Right Ventricle: The right ventricular size is normal. No increase in right ventricular wall thickness. Right ventricular systolic function is normal. Tricuspid regurgitation signal is inadequate for assessing PA pressure. Left Atrium: Left atrial size was normal in size. Right Atrium: Right atrial size was normal in size. Pericardium: There is no evidence of pericardial effusion. Mitral Valve: The mitral valve is normal in structure. No evidence of mitral valve regurgitation. No evidence of mitral valve stenosis. Tricuspid Valve: The tricuspid valve is normal in structure. Tricuspid valve regurgitation is not demonstrated. No evidence of tricuspid stenosis. Aortic Valve: The aortic valve is normal in structure. Aortic valve regurgitation is not visualized. Mild aortic valve sclerosis is present, with no evidence of aortic valve stenosis. Aortic valve mean gradient measures 4.0 mmHg. Aortic valve peak gradient measures 7.6 mmHg. Aortic valve area, by VTI measures 3.88 cm. Pulmonic Valve: The pulmonic valve was normal in structure. Pulmonic valve regurgitation is not visualized. No evidence of pulmonic stenosis. Aorta: The aortic root is normal in size and structure. Venous: The inferior vena cava is dilated in size with greater than 50% respiratory variability, suggesting right atrial pressure of 8 mmHg. IAS/Shunts: No atrial level shunt detected by color [name] Doppler.  LEFT VENTRICLE PLAX 2D LVIDd:         4.00 cm  Diastology LVIDs:  2.34 cm  LV e' medial:    6.09 cm/s LV PW:         1.16 cm  LV E/e' medial:  12.7 LV IVS:        1.19 cm  LV e' lateral:   7.62 cm/s LVOT diam:     2.10 cm  LV E/e' lateral: 10.1 LV SV:         87 LV SV Index:   47 LVOT Area:     3.46 cm  RIGHT VENTRICLE RV Basal diam:  2.98 cm RV S prime:     10.90 cm/s TAPSE (M-mode): 3.6 cm LEFT ATRIUM             Index       RIGHT ATRIUM          Index LA diam:         2.80 cm 1.52 cm/m  RA Area:     9.09 cm LA Vol (A2C):   15.1 ml 8.20 ml/m  RA Volume:   15.90 ml 8.64 ml/m LA Vol (A4C):   33.5 ml 18.20 ml/m LA Biplane Vol: 24.3 ml 13.20 ml/m  AORTIC VALVE                   PULMONIC VALVE AV Area (Vmax):    2.32 cm    PV Vmax:        0.63 m/s AV Area (Vmean):   2.62 cm    PV Peak grad:   1.6 mmHg AV Area (VTI):     3.88 cm    RVOT Peak grad: 3 mmHg AV Vmax:           138.00 cm/s AV Vmean:          80.900 cm/s AV VTI:            0.223 m AV Peak Grad:      7.6 mmHg AV Mean Grad:      4.0 mmHg LVOT Vmax:         92.40 cm/s LVOT Vmean:        61.300 cm/s LVOT VTI:          0.250 m LVOT/AV VTI ratio: 1.12  AORTA Ao Root diam: 3.20 cm MITRAL VALVE               TRICUSPID VALVE MV Area (PHT): 2.77 cm    TR Peak grad:   10.6 mmHg MV Decel Time: 274 msec    TR Vmax:        163.00 cm/s MV E velocity: 77.10 cm/s MV A velocity: 70.80 cm/s  SHUNTS MV E/A ratio:  1.09        Systemic VTI:  0.25 m                            Systemic Diam: 2.10 cm Kathlyn Sacramento MD Electronically signed by Kathlyn Sacramento MD Signature Date/Time: 04/10/2020/11:21:14 AM    Final          Subjective: Pt feeling well this AM.  Reports a headache and concerned Imdur will worsen it.  Has extreme anxiety related to many psychosocial stressors.  Requests referral to new psychiatrist for better management.   Discharge Exam: Vitals:   04/11/20 1456 04/11/20 1551  BP: 100/72 106/70  Pulse: 78 76  Resp: 20   Temp:  (!) 97.4 F (36.3 C)  SpO2: 98% 97%   Vitals:   04/11/20 0745  04/11/20 1207 04/11/20 1456 04/11/20 1551  BP: 119/83 120/83 100/72 106/70  Pulse: 73 72 78 76  Resp:   20   Temp: 98.4 F (36.9 C) 98.8 F (37.1 C)  (!) 97.4 F (36.3 C)  TempSrc: Oral Oral  Oral  SpO2: 96% 100% 98% 97%  Weight:      Height:        General: Pt is alert, awake, not in acute distress, chronically ill appearing Cardiovascular: RRR, S1/S2 +, no rubs, no gallops Respiratory: CTA bilaterally,  no wheezing, no rhonchi Abdominal: Soft, NT, ND, bowel sounds + Extremities: no edema, no cyanosis    The results of significant diagnostics from this hospitalization (including imaging, microbiology, ancillary and laboratory) are listed below for reference.     Microbiology: Recent Results (from the past 240 hour(s))  Resp Panel by RT-PCR (Flu A&B, Covid) Nasopharyngeal Swab     Status: None   Collection Time: 04/09/20 10:35 PM   Specimen: Nasopharyngeal Swab; Nasopharyngeal(NP) swabs in vial transport medium  Result Value Ref Range Status   SARS Coronavirus 2 by RT PCR NEGATIVE NEGATIVE Final    Comment: (NOTE) SARS-CoV-2 target nucleic acids are NOT DETECTED.  The SARS-CoV-2 RNA is generally detectable in upper respiratory specimens during the acute phase of infection. The lowest concentration of SARS-CoV-2 viral copies this assay can detect is 138 copies/mL. A negative result does not preclude SARS-Cov-2 infection and should not be used as the sole basis for treatment or other patient management decisions. A negative result may occur with  improper specimen collection/handling, submission of specimen other than nasopharyngeal swab, presence of viral mutation(s) within the areas targeted by this assay, and inadequate number of viral copies(<138 copies/mL). A negative result must be combined with clinical observations, patient history, and epidemiological information. The expected result is Negative.  Fact Sheet for Patients:  EntrepreneurPulse.com.au  Fact Sheet for Healthcare Providers:  IncredibleEmployment.be  This test is no t yet approved or cleared by the [place] FDA and  has been authorized for detection and/or diagnosis of SARS-CoV-2 by FDA under an Emergency Use Authorization (EUA). This EUA will remain  in effect (meaning this test can be used) for the duration of the COVID-19 declaration under Section 564(b)(1) of the  Act, 21 U.S.C.section 360bbb-3(b)(1), unless the authorization is terminated  or revoked sooner.       Influenza A by PCR NEGATIVE NEGATIVE Final   Influenza B by PCR NEGATIVE NEGATIVE Final    Comment: (NOTE) The Xpert Xpress SARS-CoV-2/FLU/RSV plus assay is intended as an aid in the diagnosis of influenza from Nasopharyngeal swab specimens and should not be used as a sole basis for treatment. Nasal washings and aspirates are unacceptable for Xpert Xpress SARS-CoV-2/FLU/RSV testing.  Fact Sheet for Patients: EntrepreneurPulse.com.au  Fact Sheet for Healthcare Providers: IncredibleEmployment.be  This test is not yet approved or cleared by the [place] FDA and has been authorized for detection and/or diagnosis of SARS-CoV-2 by FDA under an Emergency Use Authorization (EUA). This EUA will remain in effect (meaning this test can be used) for the duration of the COVID-19 declaration under Section 564(b)(1) of the Act, 21 U.S.C. section 360bbb-3(b)(1), unless the authorization is terminated or revoked.  Performed at [facility], [address] [place], [place] [address]      Labs: BNP (last 3 results) No results for input(s): BNP in the last 8760 hours. Basic Metabolic Panel: Recent Labs  Lab 04/09/20 1718 04/10/20 0415 04/11/20 0443  NA 131*  131* 136  K 4.2 3.4* 4.5  CL 98 99 104  CO2 21* 22 24  GLUCOSE 616* 522* 378*  BUN $Re'12 16 17  'gvx$ CREATININE 0.73 0.78 0.77  CALCIUM 9.7 8.7* 8.8*  MG  --   --  2.0   Liver Function Tests: No results for input(s): AST, ALT, ALKPHOS, BILITOT, PROT, ALBUMIN in the last 168 hours. No results for input(s): LIPASE, AMYLASE in the last 168 hours. No results for input(s): AMMONIA in the last 168 hours. CBC: Recent Labs  Lab 04/09/20 1718 04/10/20 0415 04/11/20 0443  WBC 8.3 7.9 7.3  HGB 15.1* 12.3 13.9  HCT 45.0 36.4 42.0  MCV 91.6 92.6 92.9  PLT 253 200 212   Cardiac  Enzymes: No results for input(s): CKTOTAL, CKMB, CKMBINDEX, TROPONINI in the last 168 hours. BNP: Invalid input(s): POCBNP CBG: Recent Labs  Lab 04/10/20 1420 04/10/20 1635 04/10/20 2125 04/11/20 0749 04/11/20 1142  GLUCAP 200* 273* 386* 411* 343*   D-Dimer No results for input(s): DDIMER in the last 72 hours. Hgb A1c Recent Labs    04/09/20 0053  HGBA1C 14.6*   Lipid Profile Recent Labs    04/10/20 0415  CHOL 206*  HDL 27*  LDLCALC UNABLE TO CALCULATE IF TRIGLYCERIDE OVER 400 mg/dL  TRIG 660*  CHOLHDL 7.6  LDLDIRECT 127.2*   Thyroid function studies No results for input(s): TSH, T4TOTAL, T3FREE, THYROIDAB in the last 72 hours.  Invalid input(s): FREET3 Anemia work up No results for input(s): VITAMINB12, FOLATE, FERRITIN, TIBC, IRON, RETICCTPCT in the last 72 hours. Urinalysis    Component Value Date/Time   COLORURINE YELLOW (A) 01/10/2020 2333   APPEARANCEUR CLEAR (A) 01/10/2020 2333   APPEARANCEUR Hazy 01/30/2014 2236   LABSPEC 1.008 01/10/2020 2333   LABSPEC 1.019 01/30/2014 2236   PHURINE 5.0 01/10/2020 2333   GLUCOSEU NEGATIVE 01/10/2020 2333   GLUCOSEU Negative 01/30/2014 2236   HGBUR NEGATIVE 01/10/2020 2333   BILIRUBINUR NEGATIVE 01/10/2020 2333   BILIRUBINUR Negative 01/30/2014 2236   KETONESUR NEGATIVE 01/10/2020 2333   PROTEINUR NEGATIVE 01/10/2020 2333   NITRITE NEGATIVE 01/10/2020 2333   LEUKOCYTESUR NEGATIVE 01/10/2020 2333   LEUKOCYTESUR 2+ 01/30/2014 2236   Sepsis Labs Invalid input(s): PROCALCITONIN,  WBC,  LACTICIDVEN Microbiology Recent Results (from the past 240 hour(s))  Resp Panel by RT-PCR (Flu A&B, Covid) Nasopharyngeal Swab     Status: None   Collection Time: 04/09/20 10:35 PM   Specimen: Nasopharyngeal Swab; Nasopharyngeal(NP) swabs in vial transport medium  Result Value Ref Range Status   SARS Coronavirus 2 by RT PCR NEGATIVE NEGATIVE Final    Comment: (NOTE) SARS-CoV-2 target nucleic acids are NOT DETECTED.  The  SARS-CoV-2 RNA is generally detectable in upper respiratory specimens during the acute phase of infection. The lowest concentration of SARS-CoV-2 viral copies this assay can detect is 138 copies/mL. A negative result does not preclude SARS-Cov-2 infection and should not be used as the sole basis for treatment or other patient management decisions. A negative result may occur with  improper specimen collection/handling, submission of specimen other than nasopharyngeal swab, presence of viral mutation(s) within the areas targeted by this assay, and inadequate number of viral copies(<138 copies/mL). A negative result must be combined with clinical observations, patient history, and epidemiological information. The expected result is Negative.  Fact Sheet for Patients:  EntrepreneurPulse.com.au  Fact Sheet for Healthcare Providers:  IncredibleEmployment.be  This test is no t yet approved or cleared by the [place] and  has been authorized  for detection and/or diagnosis of SARS-CoV-2 by FDA under an Emergency Use Authorization (EUA). This EUA will remain  in effect (meaning this test can be used) for the duration of the COVID-19 declaration under Section 564(b)(1) of the Act, 21 U.S.C.section 360bbb-3(b)(1), unless the authorization is terminated  or revoked sooner.       Influenza A by PCR NEGATIVE NEGATIVE Final   Influenza B by PCR NEGATIVE NEGATIVE Final    Comment: (NOTE) The Xpert Xpress SARS-CoV-2/FLU/RSV plus assay is intended as an aid in the diagnosis of influenza from Nasopharyngeal swab specimens and should not be used as a sole basis for treatment. Nasal washings and aspirates are unacceptable for Xpert Xpress SARS-CoV-2/FLU/RSV testing.  Fact Sheet for Patients: EntrepreneurPulse.com.au  Fact Sheet for Healthcare Providers: IncredibleEmployment.be  This test is not yet approved or  cleared by the [place] FDA and has been authorized for detection and/or diagnosis of SARS-CoV-2 by FDA under an Emergency Use Authorization (EUA). This EUA will remain in effect (meaning this test can be used) for the duration of the COVID-19 declaration under Section 564(b)(1) of the Act, 21 U.S.C. section 360bbb-3(b)(1), unless the authorization is terminated or revoked.  Performed at [facility], [address] [place], [place] [address]      Time coordinating discharge: Over 30 minutes  SIGNED:   Ezekiel Slocumb, DO [facility] 04/11/2020, 4:02 PM   If 7PM-7AM, please contact night-coverage [email or web address]

## 2020-04-11 NOTE — Progress Notes (Addendum)
Inpatient Diabetes Program Recommendations  AACE/ADA: New Consensus Statement on Inpatient Glycemic Control (2015)  Target Ranges:  Prepandial:   less than 140 mg/dL      Peak postprandial:   less than 180 mg/dL (1-2 hours)      Critically ill patients:  140 - 180 mg/dL   Results for Jennifer [name], Jennifer [name] (MRN [id number]) as of 04/11/2020 07:20  Ref. Range 04/09/2020 17:18  Glucose Latest Ref Range: 70 - 99 mg/dL [facility])   Results for [name], [name] (MRN [id number]) as of 04/11/2020 07:20  Ref. Range 04/10/2020 06:45 04/10/2020 09:02 04/10/2020 14:20 04/10/2020 16:35 04/10/2020 21:25  Glucose-Capillary Latest Ref Range: 70 - 99 mg/dL 219 (H) 86 200 (H) 273 (H) 386 (H)  25 units LANTUS   Results for [name], [name] (MRN [id number]) as of 04/11/2020 07:20  Ref. Range 04/11/2020 04:43  Glucose Latest Ref Range: 70 - 99 mg/dL 378 (H)   Results for [name], [name] (MRN [id number]) as of 04/11/2020 07:20  Ref. Range 04/09/2020 00:53  Hemoglobin A1C Latest Ref Range: 4.8 - 5.6 % 14.6 (H)  (372 mg/dl)    Home DM Meds: Tresiba 66 units QHS        Victoza 1.8 mg Daily   Current Orders: Lantus 25 units QHS       Novolog Moderate Correction Scale/ SSI (0-15 units) TID AC    MD- Note Lab glucose 378 this AM.  Please consider:  Increase Lantus to 50 units QHS (75% total home dose)  Please also give pt a Rx for new CBG meter at time of discharge: Order # [id number]    Addendum 11:30am--Met w/ pt at bedside today to discuss current A1c of 14.6%.  Pt told me she lost her CBG meter and has not been checking CBGs (stated she was evicted from her home).  Has NOT been taking her [place] nor her Victoza for 2 months now.  Stated she has the 2 meds at home in the fridge.  Told me she stopped them b/c she was afraid she may have a low CBG and couldn't check her CBG since she didn't have a meter.  Explained to pt that she could have gone to [employer] to purchase a CBG meter OTC for low cost.  Pt kept  talking over me and focused a lot on her anxiety and told me she wants to start getting all her care at the [place] [facility].  Explained what an A1c is and what it measures.  Reminded patient that her goal A1c is 7% or less per ADA standards to prevent both acute and long-term complications.  Explained to patient the extreme importance of good glucose control at home.  Encouraged patient to check her CBGs at least TID AC at home (an occasionally 2 hour after meals) and to record all CBGs in a logbook for her PCP to review.   Reviewed CBG goals for home and encouraged pt to take her DM meds as prescribed and not to stop her meds without consulting her PCP.    --Will follow patient during hospitalization--  Wyn Quaker RN, MSN, CDE Diabetes Coordinator Inpatient Glycemic Control Team Team Pager: [phone] ([phone]-5p)

## 2020-04-23 NOTE — Progress Notes (Deleted)
Cardiology Office Note    Date:  04/23/2020   ID:  Jennifer [name], DOB Sep 19, 1960, MRN [id number]  PCP:  Althea Grimmer, MD  Cardiologist:  Julien Nordmann, MD  Electrophysiologist:  None   Chief Complaint: Hospital follow up  History of Present Illness:   Jennifer [name] is a 60 y.o. female with history of CAD s/p multiple prior stents described below, diastolic dysfunction, DM2, HTN, HLD, sleep apnea not on CPAP, ongoing tobacco abuse - smoking 1/4 pack of cigarettes daily, PAD, chronic pain, obesity, recurrent nephrolithiasis, anxiety, and depression who presents for hospital follow up after her recent admission to [facility] from 12/20 to 12/22 for NSTEMI.   She previously underwent stenting to the RCA in 2008 followed by recurrence of chest pain in 2010, leading to an abnormal stress test that showed prior inferior infarct with peri-infarct ischemia. Cardiac cath in 2010 showed 95% stenosis in the mid RCA, diffuse 60% stenosis in the proximal RDPA. She underwent successful PCI/DES of the mid RCA. She redeevloped chest pain in 05/2014 and was transferred from [facility] to [facility]. Troponin was negative. Cardiac cath 06/15/2014, showed widely patent but small left main, mild disease in the LAD and its branches, mild to moderate disease in the LCx and its branches, severe 90% stenosis in the distal RCA s/p PCI/DES, normal LVSF, LVEDP 28 mm Hg. She was admitted on 02/23/15 with recurrence of chest pain with associated palpitations. Troponin was negative. ECG showed no acute changes. She underwent nuclear stress test on 02/23/15 that showed medium defect of moderate severity present in the mid inferoseptal, mid inferior and apical inferior location. This was consistent with ischemia, EF 54%, intermediate risk study. She underwent cardiac cath on 02/26/15 that showed severe one-vessel CAD with severe ISR in the proximal segment of the previously placed stent in the distal RCA. She underwent successful PCI/DES to the  distal RCA for ISR. Mildly elevated LVEDP. Normal EF by nuclear stress testing. She was seen in 02/2015 for post-cath follow up and noted nonexertional chest pain lasting 5-10 seconds in duration. She had no exertional symptoms and could go all day running around without any symptoms. She underwent treadmill Myoview on 03/19/15 that was low risk with an EF of 55-65%. She was admitted to [facility] 07/2016 with unstable angina/NSTEMI with peak troponin of 0.12. Echo 07/2016 showed EF 60-65%, no RWMA, Gr1DD, mild AI, left atrium normal in size, RV systolic function normal, PASP normal. LHC 07/2016 showed severe one-vessel CAD affecting the RCA with multiple previous stenting. There was felt to be a plaque rupture within the proximal to mid stent which was the culprit for her unstable angina. She underwent successful PCI/DES placement to the proximal/mid RCA within the previously placed stent. There was 60% disease in the mid to distal segment which was left to be treated medically. She was seen in the office on 02/18/2017 for unstable angina. She underwent LHC 02/19/2017 that showed the left main normal, LAD normal, D1 40%, LCx minor irregs, OM1 60%, proximal to mid RCA 50-60% stenosed with ISR in the proximal region, mid to distal RCA 50%, RPDA 90% filled by collaterals from 3rd septal,LVEF 55-65%. Medical management was advised. Echo 02/24/2017 showed an EF of 55-60%, no RWMA, Gr1DD, RVSF normal, PASP normal.  She underwent Lexiscan Myoview on 06/04/17 which showed a large region of inferior wall ischemia as well as inferior lateral wall ischemia, EF 57%, lateral wall hypokinesis, high risk scan. She underwent LHC 06/09/17 that showed severe,  single-vessel CAD with focal 95% in-stent restenosis of the mid RCA, where two layers of stent are already present. There was also moderate stenosis of the mid/distal RCA (unchanged from prior caths), and high grade disease involving the ostial/proximal segment of the PDA (small vessel).  Mild to moderate, nonobstructive CAD involving the left coronary artery. Mildly elevated LV filling pressure. She underwent successful PTCA to in-stent restenosis of the mid RCA with reduction of stenosis from 95% to 0%. Lifelong DAPT was recommended. She underwent echo at [facility] in 07/2018, for dyspnea, which showed an EF of >55%, mild LVH, normal RVSF, no significant valvular abnormalities, and a dilated ascending aorta.  She was last seen in the office in 11/2019 and was doing well from a cardiac perspective.  She was under significant stress at home, and in this setting had increased her tobacco use from 4 cigarettes/day to greater than a pack per day.  With her increased stress she was also noted elevated BP readings.  Following this, she was admitted to the hospital on 04/09/2020 with an NSTEMI with high-sensitivity troponin peaking at 799.  Echo showed an EF of 60 to 65%, no regional wall motion normalities, mild LVH, normal LV diastolic function parameters, normal RV systolic function and ventricular cavity size, mild aortic valve sclerosis without evidence of stenosis, and an estimated right atrial pressure of 8 mmHg.  LHC on 04/10/2020 showed significant one-vessel CAD with patent stents in the proximal/mid RCA as well as distal RCA with mild in-stent restenosis.  Moderate stenosis in the mid to distal RCA and significant stenosis in a small diameter rPDA was unchanged, to possibly improved, from most recent cath.  There was no clear culprit for the patient's symptoms of unstable angina.  It was noted she had small vessel disease though this actually appeared to be improved when compared to cath in 2018.  Continued optimization of medical therapy was recommended.  ***   Labs independently reviewed: 03/2020 - magnesium 2.0, HGB 13.9, PLT 212, potassium 4.5, BUN 17, SCr 0.77, direct LDL 127, TC 206, TG 660, HDL 27, A1c 14.6 11/2019 - albumin 4.5, AST/ALT normal  Past Medical History:  Diagnosis Date  .  Anxiety   . Arthritis    "qwhere" (06/14/2014)  . Bipolar disorder (HCC)   . CAD (coronary artery disease)    a. 2008 PCI/DES to RCA; b. 2012 PCI: 95% mRCA s/p PCI/DES; b. 2/16 PCI DES-> 90% dRCA; c. 11/16 PCI: dRCA ISR s/p PCI/DES;  d. 4/18 PCI: p-mRCA 95% s/p PCI/DES; e. 02/2017 Cath: LML nl, LAD nl, D1 40, LCX min irregs, OM1 60, RCA 50-60p/m ISR, 22m/d, patent dRCA stent, RPDA 90ost (fills via L->R collats), EF 55-65%.  . Chronic bronchitis (HCC)    "get it q yr"  . Daily headache    "when my blood pressure is up" (06/14/2014)  . Depression   . Diastolic dysfunction    a. 02/2017 Echo: EF 55-60%, no rwma, Gr1 DD, nl RV fxn.  [name] GERD (gastroesophageal reflux disease)   . Heart murmur   . High cholesterol   . History of stomach ulcers   . Hypertension   . Insomnia   . Kidney stones   . Migraine    "haven't had them in a good while; did get them 1-2 times/yr" (06/14/2014)  . Myocardial infarction (HCC) 2008; ~ 2012  . Pneumonia    "get it 1-2 times/year" (06/14/2014)  . RLS (restless legs syndrome)   . Seizures (HCC)   .  Sleep apnea    "they want me to do the lab but I haven't" (06/14/2014)  . Stroke [facility])    "they say I've had several" (06/14/2014)  . Tobacco abuse   . Type II diabetes mellitus (HCC)   . Urinary, incontinence, stress female     Past Surgical History:  Procedure Laterality Date  . ABDOMINAL HYSTERECTOMY  1984   "I've got 1 ovary left"  . CARDIAC CATHETERIZATION N/A 02/26/2015   Procedure: Left Heart Cath and Coronary Angiography;  Surgeon: Iran Ouch, MD;  Location: [facility] INVASIVE CV LAB;  Service: Cardiovascular;  Laterality: N/A;  . CARDIAC CATHETERIZATION N/A 02/26/2015   Procedure: Coronary Stent Intervention;  Surgeon: Iran Ouch, MD;  Location: [facility] INVASIVE CV LAB;  Service: Cardiovascular;  Laterality: N/A;  . CESAREAN SECTION  1984  . CORONARY ANGIOPLASTY WITH STENT PLACEMENT  2008; ~ 2012   "1; 1"  . CORONARY BALLOON ANGIOPLASTY N/A  06/09/2017   Procedure: CORONARY BALLOON ANGIOPLASTY;  Surgeon: Yvonne Kendall, MD;  Location: [facility] INVASIVE CV LAB;  Service: Cardiovascular;  Laterality: N/A;  . CORONARY STENT INTERVENTION N/A 08/04/2016   Procedure: Coronary Stent Intervention;  Surgeon: Iran Ouch, MD;  Location: [facility] INVASIVE CV LAB;  Service: Cardiovascular;  Laterality: N/A;  . DILATION AND CURETTAGE OF UTERUS    . EXTRACORPOREAL SHOCK WAVE LITHOTRIPSY  X 2  . I & D EXTREMITY Left 02/09/2018   Procedure: IRRIGATION AND DEBRIDEMENT EXTREMITY;  Surgeon: Carolan Shiver, MD;  Location: [facility] ORS;  Service: General;  Laterality: Left;  . KNEE ARTHROSCOPY Left   . LEFT HEART CATH Bilateral 08/04/2016   Procedure: Left Heart Cath poss PCI;  Surgeon: Iran Ouch, MD;  Location: [facility] INVASIVE CV LAB;  Service: Cardiovascular;  Laterality: Bilateral;  . LEFT HEART CATH AND CORONARY ANGIOGRAPHY Left 02/19/2017   Procedure: LEFT HEART CATH AND CORONARY ANGIOGRAPHY;  Surgeon: Antonieta Iba, MD;  Location: [facility] INVASIVE CV LAB;  Service: Cardiovascular;  Laterality: Left;  . LEFT HEART CATH AND CORONARY ANGIOGRAPHY Left 06/09/2017   Procedure: LEFT HEART CATH AND CORONARY ANGIOGRAPHY;  Surgeon: Yvonne Kendall, MD;  Location: [facility] INVASIVE CV LAB;  Service: Cardiovascular;  Laterality: Left;  . LEFT HEART CATH AND CORONARY ANGIOGRAPHY N/A 04/10/2020   Procedure: LEFT HEART CATH AND CORONARY ANGIOGRAPHY;  Surgeon: Iran Ouch, MD;  Location: [facility] INVASIVE CV LAB;  Service: Cardiovascular;  Laterality: N/A;  . LEFT HEART CATHETERIZATION WITH CORONARY ANGIOGRAM N/A 06/15/2014   Procedure: LEFT HEART CATHETERIZATION WITH CORONARY ANGIOGRAM;  Surgeon: Corky Crafts, MD;  Location: [facility] CATH LAB;  Service: Cardiovascular;  Laterality: N/A;  . PERCUTANEOUS CORONARY STENT INTERVENTION (PCI-S)  06/15/2014   Procedure: PERCUTANEOUS CORONARY STENT INTERVENTION (PCI-S);  Surgeon: Corky Crafts, MD;  Location: [facility]  CATH LAB;  Service: Cardiovascular;;  . TONSILLECTOMY    . TUBAL LIGATION      Current Medications: No outpatient medications have been marked as taking for the 04/26/20 encounter (Appointment) with Sondra Barges, PA-C.    Allergies:   Carbamazepine, Lithium, Lodine [etodolac], Codeine, Levofloxacin in d5w, Methocarbamol, Tape, and Relafen [nabumetone]   Social History   Socioeconomic History  . Marital status: Legally Separated    Spouse name: Not on file  . Number of children: Not on file  . Years of education: Not on file  . Highest education level: Not on file  Occupational History  . Not on file  Tobacco Use  . Smoking status: Current Every Day  Smoker    Packs/day: 0.25    Years: 35.00    Pack years: 8.75    Types: Cigarettes  . Smokeless tobacco: Never Used  . Tobacco comment: Patches  Vaping Use  . Vaping Use: Never used  Substance and Sexual Activity  . Alcohol use: Yes    Alcohol/week: 0.0 standard drinks    Comment: occ  . Drug use: Yes    Types: Marijuana    Comment: past week  . Sexual activity: Not on file  Other Topics Concern  . Not on file  Social History Narrative  . Not on file   Social Determinants of Health   Financial Resource Strain: Not on file  Food Insecurity: Not on file  Transportation Needs: Not on file  Physical Activity: Not on file  Stress: Not on file  Social Connections: Not on file     Family History:  The patient's family history includes Cirrhosis in her father; Lung cancer in her mother.  ROS:   ROS   EKGs/Labs/Other Studies Reviewed:    Studies reviewed were summarized above. The additional studies were reviewed today:  LHC 04/10/2020:  1st Diag lesion is 40% stenosed.  1st Mrg lesion is 25% stenosed.  Non-stenotic Dist RCA lesion was previously treated.  Mid RCA lesion is 40% stenosed.  Ost RPDA to RPDA lesion is 80% stenosed.  Prox RCA to Mid RCA lesion is 20% stenosed.   1.  Significant one-vessel  coronary artery disease with patent stents in the proximal/mid RCA as well as distal RCA with mild in-stent restenosis.  Moderate stenosis in the mid to distal RCA and significant stenosis in a small diameter right PDA is unchanged from most recent catheterization and if anything the 2 lesions look better.  Mild disease affecting the left system. 2.  Left ventricular angiography was not performed.  EF was normal by echo.  Mildly elevated left ventricular end-diastolic pressure.  Recommendations: I do not see a clear culprit for the patient's symptoms of unstable angina.  She does have small vessel disease but it actually appears better than most recent angiography in 2018.  I recommend continuing medical therapy.  I do suspect that anxiety is significantly contributing to her symptoms. __________  2D echo 04/10/2020: 1. Left ventricular ejection fraction, by estimation, is 60 to 65%. The  left ventricle has normal function. The left ventricle has no regional  wall motion abnormalities. There is mild left ventricular hypertrophy.  Left ventricular diastolic parameters  were normal.  2. Right ventricular systolic function is normal. The right ventricular  size is normal. Tricuspid regurgitation signal is inadequate for assessing  PA pressure.  3. The mitral valve is normal in structure. No evidence of mitral valve  regurgitation. No evidence of mitral stenosis.  4. The aortic valve is normal in structure. Aortic valve regurgitation is  not visualized. Mild aortic valve sclerosis is present, with no evidence  of aortic valve stenosis.  5. The inferior vena cava is dilated in size with >50% respiratory  variability, suggesting right atrial pressure of 8 mmHg.  __________  LHC 05/2017: Conclusions: 1. Severe single-vessel coronary artery disease with focal 95% in-stent restenosis of the mid RCA, where two layers of stent are already present. There is also moderate stenosis of the mid/distal  RCA (unchanged from prior caths) and high grade disease involving the ostial/proximal segment of the PDA (small vessel). 2. Mild to moderate, nonobstructive CAD involving the left coronary artery. 3. Mildly elevated left ventricular filling  pressure. 4. Successful balloon angioplasty to in-stent restenosis of the mid RCA with reduction of stenosis from 95% to 0%.  Recommendations: 1. Admit for overnight observation/extended recovery. 2. Dual antiplatelet therapy with aspirin and clopidogrel through at least 07/2016, ideally lifelong. 3. Aggressive secondary prevention, including smoking cessation. 4. If in-stent restenosis recurs, referral for single vessel CABG, brrachytherapy, or drug-coated balloon angioplasty (not available in the [place]) would need to be considered. __________  Eugenie BirksLexiscan MPI 05/2017: Pharmacological myocardial perfusion imaging study with large region of inferior wall ischemia noted on non-attenuation corrected images Inferolateral wall  Ischemia  also noted Attenuation corrected images with ischemia mid to distal RCA, inferolateral,  fixed region proximal to mid RCA Lateral wall hypokinesis, EF estimated at 57% No EKG changes concerning for ischemia at peak stress or in recovery. High risk scan Previous cardiac catheterization with 50% proximal RCA, 50% mid RCA .  Progression of these lesions could explain results above Would recommend cardiac catheterization.  High risk of progression of her disease that she continues to smoke __________  2D echo 02/2017: - Left ventricle: The cavity size was normal. Systolic function was  normal. The estimated ejection fraction was in the range of 55%  to 60%. Wall motion was normal; there were no regional wall  motion abnormalities. Doppler parameters are consistent with  abnormal left ventricular relaxation (grade 1 diastolic  dysfunction).  - Right ventricle: Systolic function was normal.  - Pulmonary  arteries: Systolic pressure was within the normal  range.  __________  [facility]:  Coronary dominance: Right  Left Main Vessel is angiographically normal. Left Anterior Descending Vessel is angiographically normal. First Diagonal Branch 1st Diag lesion, 40% stenosed. Second Diagonal Branch Vessel is small in size. Vessel is angiographically normal. Third Diagonal Branch Vessel is angiographically normal.  Left Circumflex Vessel is small. There is mild the vessel. First Obtuse Marginal Branch Vessel is moderate in size. 1st Mrg lesion, 60% stenosed. Third Obtuse Marginal Branch Vessel is angiographically normal.  Right Coronary Artery Prox RCA to Mid RCA lesion, 50 to 60% stenosed, ISR proximal region. Mid RCA to Dist RCA lesion, 50% stenosed. Dist RCA lesion, 0% stenosed. Dist RCA lesion with no stenosis was previously treated. Right Posterior Descending Artery RPDA filled by collaterals from 3rd Sept. Ost RPDA to RPDA lesion, 90% stenosed. Right Posterior Atrioventricular Branch The vessel exhibits minimal luminal irregularities. First Right Posterolateral Vessel is small in size. Second Right Posterolateral Vessel is angiographically normal. Third Right Posterolateral Vessel is angiographically normal.  Left ventriculography: Left ventricular systolic function is normal, LVEF is estimated at 55-65%, there is no significant mitral regurgitation , no significant aortic valve stenosis  Final Conclusions:   Moderate proximal RCA disease, LCX disease Medical management recommended Case discussed with Dr. Kirke CorinArida  Recommendations:  Recommended smoking cessation Severe anxiety at baseline She does not want follow up with psychiatry, No longer sees pain management Recommended if she wants xanax that she talk with her primary care doctor __________  [facility]   The left ventricular systolic function is normal.  LV end diastolic  pressure is mildly elevated.  The left ventricular ejection fraction is 55-65% by visual estimate.  Dist RCA lesion, 0 %stenosed.  Mid RCA to Dist RCA lesion, 60 %stenosed.  1st Diag lesion, 40 %stenosed.  1st Mrg lesion, 60 %stenosed.  Ost RPDA to RPDA lesion, 60 %stenosed.  A STENT RESOLUTE ONYX 3.5X30 drug eluting stent was successfully placed.  Prox RCA to Mid RCA  lesion, 95 %stenosed.  Post intervention, there is a 0% residual stenosis.   1. Severe one-vessel coronary artery disease affecting the right coronary artery with multiple previous stenting. There seems to be a plaque rupture within the proximal to mid stent which is the culprit for unstable angina. Stable disease in the left circumflex. 2. Normal LV systolic function and mildly elevated left ventricular end-diastolic pressure. 3. Successful angioplasty and drug-eluting stent placement to the proximal/mid right coronary artery within the previously placed stent. There was 60% disease in the mid to distal segment which was left to be treated medically.  Recommendations: Dual antiplatelet therapy indefinitely. Aggressive treatment of risk factors, smoking cessation in cardiac rehabilitation. I added amlodipine for better blood pressure control. Wean off nitroglycerin drip. As was noted during previous cardiac catheterization, it was extremely difficult to sedate the patient and control her anxiety throughout the case. __________  2D echo 07/2016: - Left ventricle: The cavity size was normal. Systolic function was  normal. The estimated ejection fraction was in the range of 60%  to 65%. Wall motion was normal; there were no regional wall  motion abnormalities. Doppler parameters are consistent with  abnormal left ventricular relaxation (grade 1 diastolic  dysfunction).  - Aortic valve: There was mild regurgitation.  - Left atrium: The atrium was normal in size.  - Right ventricle: Systolic function was  normal.  - Pulmonary arteries: Systolic pressure was within the normal  range. __________  [facility] 02/2015:  Prox RCA lesion, 40% stenosed.  Prox RCA to Mid RCA lesion, 40% stenosed. The lesion was previously treated with a stent (unknown type).  Ost RPDA lesion, 30% stenosed.  1st Diag lesion, 20% stenosed.  1st Mrg lesion, 70% stenosed.  Dist RCA-1 lesion, 99% stenosed.  Dist RCA-2 lesion, 99% stenosed. Post intervention, there is a 0% residual stenosis. The lesion was previously treated with a drug-eluting stentsix to twelve months ago.   1. Severe one-vessel coronary artery disease. Severe in-stent restenosis in the proximal segment of the previously placed stents in the distal RCA. Stable left circumflex disease. 2. Mildly elevated left ventricular end-diastolic pressure. Normal ejection fraction by nuclear stress testing. 3. Successful angioplasty and drug-eluting stent placement to the distal RCA for in-stent restenosis.  Recommendations: Dual antiplatelet therapy for at least one year. Continue aggressive treatment of risk factors. I strongly advised her to quit smoking. The patient throughout the case requested more sedation although she was somnolent. She was given as much as safely can be given without compromising her respiratory status. At the end of the case, she became more combative and demanded something stronger than fentanyl. The case was overall difficult as the patient was somewhat intermittently agitated.   EKG:  EKG is ordered today.  The EKG ordered today demonstrates ***  Recent Labs: 12/09/2019: ALT 17 04/11/2020: BUN 17; Creatinine, Ser 0.77; Hemoglobin 13.9; Magnesium 2.0; Platelets 212; Potassium 4.5; Sodium 136  Recent Lipid Panel    Component Value Date/Time   CHOL 206 (H) 04/10/2020 0415   CHOL 170 12/09/2019 1528   TRIG 660 (H) 04/10/2020 0415   HDL 27 (L) 04/10/2020 0415   HDL 38 (L) 12/09/2019 1528   CHOLHDL 7.6 04/10/2020 0415   VLDL  UNABLE TO CALCULATE IF TRIGLYCERIDE OVER 400 mg/dL 56/38/7564 3329   LDLCALC UNABLE TO CALCULATE IF TRIGLYCERIDE OVER 400 mg/dL 51/88/4166 0630   LDLCALC 103 (H) 12/09/2019 1528   LDLDIRECT 127.2 (H) 04/10/2020 0415    PHYSICAL EXAM:    VS:  There were no vitals taken for this visit.  BMI: There is no height or weight on file to calculate BMI.  Physical Exam  Wt Readings from Last 3 Encounters:  04/11/20 184 lb 1.6 oz (83.5 kg)  03/12/20 183 lb (83 kg)  12/09/19 194 lb 3.2 oz (88.1 kg)     ASSESSMENT & PLAN:   1. CAD involving the native coronary arteries with recent NSTEMI:  2. HTN: Blood pressure ***  3. HLD: Direct LDL 127 from 03/2020 with normal LFT in 11/2019.  Goal LDL < 70.  ***  4. Tobacco use:  5. Uncontrolled diabetes:  6. Morbid obesity with OSA:   Disposition: F/u with Dr. Mariah MillingGollan or an APP in ***.   Medication Adjustments/Labs and Tests Ordered: Current medicines are reviewed at length with the patient today.  Concerns regarding medicines are outlined above. Medication changes, Labs and Tests ordered today are summarized above and listed in the Patient Instructions accessible in Encounters.   Signed, Eula Listenyan Donzell Coller, PA-C 04/23/2020 9:59 AM     [facility] [address] [place], [place] [address] [phone]

## 2020-04-26 ENCOUNTER — Ambulatory Visit: Payer: Medicare HMO | Admitting: Physician Assistant

## 2020-04-27 ENCOUNTER — Encounter: Payer: Self-pay | Admitting: Physician Assistant

## 2020-06-21 ENCOUNTER — Other Ambulatory Visit: Payer: Self-pay

## 2020-06-21 ENCOUNTER — Encounter: Payer: Self-pay | Admitting: Emergency Medicine

## 2020-06-21 DIAGNOSIS — K029 Dental caries, unspecified: Secondary | ICD-10-CM | POA: Insufficient documentation

## 2020-06-21 DIAGNOSIS — Z955 Presence of coronary angioplasty implant and graft: Secondary | ICD-10-CM | POA: Diagnosis not present

## 2020-06-21 DIAGNOSIS — J449 Chronic obstructive pulmonary disease, unspecified: Secondary | ICD-10-CM | POA: Diagnosis not present

## 2020-06-21 DIAGNOSIS — S00502A Unspecified superficial injury of oral cavity, initial encounter: Secondary | ICD-10-CM | POA: Diagnosis present

## 2020-06-21 DIAGNOSIS — I10 Essential (primary) hypertension: Secondary | ICD-10-CM | POA: Diagnosis not present

## 2020-06-21 DIAGNOSIS — F17210 Nicotine dependence, cigarettes, uncomplicated: Secondary | ICD-10-CM | POA: Insufficient documentation

## 2020-06-21 DIAGNOSIS — I2510 Atherosclerotic heart disease of native coronary artery without angina pectoris: Secondary | ICD-10-CM | POA: Insufficient documentation

## 2020-06-21 DIAGNOSIS — X58XXXA Exposure to other specified factors, initial encounter: Secondary | ICD-10-CM | POA: Insufficient documentation

## 2020-06-21 DIAGNOSIS — S025XXA Fracture of tooth (traumatic), initial encounter for closed fracture: Secondary | ICD-10-CM | POA: Insufficient documentation

## 2020-06-21 DIAGNOSIS — Z8673 Personal history of transient ischemic attack (TIA), and cerebral infarction without residual deficits: Secondary | ICD-10-CM | POA: Insufficient documentation

## 2020-06-21 DIAGNOSIS — Z79899 Other long term (current) drug therapy: Secondary | ICD-10-CM | POA: Insufficient documentation

## 2020-06-21 DIAGNOSIS — M5431 Sciatica, right side: Secondary | ICD-10-CM | POA: Insufficient documentation

## 2020-06-21 DIAGNOSIS — E119 Type 2 diabetes mellitus without complications: Secondary | ICD-10-CM | POA: Diagnosis not present

## 2020-06-21 DIAGNOSIS — Z7902 Long term (current) use of antithrombotics/antiplatelets: Secondary | ICD-10-CM | POA: Diagnosis not present

## 2020-06-21 DIAGNOSIS — Z7982 Long term (current) use of aspirin: Secondary | ICD-10-CM | POA: Insufficient documentation

## 2020-06-21 NOTE — ED Triage Notes (Signed)
Pt to triage via w/c with no distress noted; pt c/o rt hip pain radiating down leg x week; denies any known injury; also c/o rt sided dental pain

## 2020-06-22 ENCOUNTER — Emergency Department
Admission: EM | Admit: 2020-06-22 | Discharge: 2020-06-22 | Disposition: A | Discharge status: Home / Self Care | Payer: Medicare HMO | Attending: Emergency Medicine | Admitting: Emergency Medicine

## 2020-06-22 DIAGNOSIS — S025XXA Fracture of tooth (traumatic), initial encounter for closed fracture: Secondary | ICD-10-CM | POA: Diagnosis not present

## 2020-06-22 DIAGNOSIS — M5431 Sciatica, right side: Secondary | ICD-10-CM

## 2020-06-22 DIAGNOSIS — K0889 Other specified disorders of teeth and supporting structures: Secondary | ICD-10-CM

## 2020-06-22 DIAGNOSIS — K029 Dental caries, unspecified: Secondary | ICD-10-CM

## 2020-06-22 MED ORDER — LIDOCAINE VISCOUS HCL 2 % MT SOLN
15.0000 mL | Freq: Once | OROMUCOSAL | Status: AC
  Administered 2020-06-22: 15 mL via OROMUCOSAL
  Filled 2020-06-22: qty 15

## 2020-06-22 MED ORDER — AMOXICILLIN 500 MG PO CAPS
500.0000 mg | ORAL_CAPSULE | Freq: Once | ORAL | Status: AC
  Administered 2020-06-22: 500 mg via ORAL
  Filled 2020-06-22: qty 1

## 2020-06-22 MED ORDER — ONDANSETRON 4 MG PO TBDP: 4.0000 mg | ORAL_TABLET | Freq: Three times a day (TID) | ORAL | 0 refills | Status: DC | PRN

## 2020-06-22 MED ORDER — OXYCODONE-ACETAMINOPHEN 5-325 MG PO TABS: 1.0000 | ORAL_TABLET | ORAL | 0 refills | Status: DC | PRN

## 2020-06-22 MED ORDER — OXYCODONE-ACETAMINOPHEN 5-325 MG PO TABS
1.0000 | ORAL_TABLET | Freq: Once | ORAL | Status: AC
Start: 2020-06-22 — End: 2020-06-22
  Administered 2020-06-22: 1 via ORAL
  Filled 2020-06-22: qty 1

## 2020-06-22 MED ORDER — DIAZEPAM 2 MG PO TABS: 2.0000 mg | ORAL_TABLET | Freq: Three times a day (TID) | ORAL | 0 refills | Status: DC | PRN

## 2020-06-22 MED ORDER — AMOXICILLIN 500 MG PO CAPS: 500.0000 mg | ORAL_CAPSULE | Freq: Once | ORAL | Status: DC

## 2020-06-22 MED ORDER — AMOXICILLIN 500 MG PO CAPS: 500.0000 mg | ORAL_CAPSULE | Freq: Three times a day (TID) | ORAL | 0 refills | Status: DC

## 2020-06-22 MED ORDER — ONDANSETRON 4 MG PO TBDP
4.0000 mg | ORAL_TABLET | Freq: Once | ORAL | Status: AC
  Administered 2020-06-22: 4 mg via ORAL
  Filled 2020-06-22: qty 1

## 2020-06-22 MED ORDER — DIAZEPAM 2 MG PO TABS
2.0000 mg | ORAL_TABLET | Freq: Once | ORAL | Status: AC
  Administered 2020-06-22: 2 mg via ORAL
  Filled 2020-06-22: qty 1

## 2020-06-22 MED ORDER — METHYLPREDNISOLONE 4 MG PO TBPK: ORAL_TABLET | ORAL | 0 refills | Status: DC

## 2020-06-22 NOTE — ED Notes (Addendum)
Pt ambulatory inside room - reports ongoing R hip pain radiating to proximal region of R knee described as sharp; states certain positions worsen pain and walking makes pain better-- pt also reports generalized dental pain - missing teeth noted with dental caries.

## 2020-06-22 NOTE — ED Notes (Signed)
Pt agreeable with d/c plan as discussed by Dr Wynelle Link -- this nurse has verbally reinforced d/c instructions and provided pt with written copy - pt acknowledges verbal understanding and denies any additional needs, questions, concerns.  Ambulatory at discharge to lobby to await ride; ambulates independently with steady gait.

## 2020-06-22 NOTE — Discharge Instructions (Signed)
1.  Take antibiotic as prescribed (Amoxicillin). 2.  You may take medicines as needed for pain and muscle spasms (Percocet/Valium). 3.  Take steroid taper as prescribed (Medrol Dosepak). 4.  Return to the ER for worsening symptoms, persistent vomiting, difficulty breathing, losing control of your bowel or bladder, or other concerns

## 2020-06-22 NOTE — ED Provider Notes (Signed)
[facility] Emergency Department Provider Note   ____________________________________________   Event Date/Time   First MD Initiated Contact with Patient 06/22/20 0230     (approximate)  I have reviewed the triage vital signs and the nursing notes.   HISTORY  Chief Complaint Dental Pain and Hip Pain    HPI Jennifer [name] is a 60 y.o. female who presents to the ED from home with a chief complaint of right sided dental pain and right hip pain.  Patient reports broken and rotting teeth x1 year with intermittent flareups of pain.  She is scared to go to the dentist due to previous bad experience.  Reports recent flareup of right upper and lower dental pain.  Denies fever, swelling, difficulty swallowing or breathing.  Also reports right sciatica pain x1 week.  Complains of pain to her buttocks radiating down her leg.  Denies associated extremity weakness, numbness/tingling, bowel or bladder incontinence.  Denies fall/injury.     Past Medical History:  Diagnosis Date  . Anxiety   . Arthritis    "qwhere" (06/14/2014)  . Bipolar disorder ([place])   . CAD (coronary artery disease)    a. 2008 PCI/DES to RCA; b. 2012 PCI: 95% mRCA s/p PCI/DES; b. 2/16 PCI DES-> 90% dRCA; c. 11/16 PCI: dRCA ISR s/p PCI/DES;  d. 4/18 PCI: p-mRCA 95% s/p PCI/DES; e. 02/2017 Cath: LML nl, LAD nl, D1 40, LCX min irregs, OM1 60, RCA 50-60p/m ISR, 80m/d, patent dRCA stent, RPDA 90ost (fills via L->R collats), EF 55-65%.  . Chronic bronchitis ([place])    "get it q yr"  . Daily headache    "when my blood pressure is up" (06/14/2014)  . Depression   . Diastolic dysfunction    a. 02/2017 Echo: EF 55-60%, no rwma, Gr1 DD, nl RV fxn.  [name] GERD (gastroesophageal reflux disease)   . Heart murmur   . High cholesterol   . History of stomach ulcers   . Hypertension   . Insomnia   . Kidney stones   . Migraine    "haven't had them in a good while; did get them 1-2 times/yr" (06/14/2014)  . Myocardial  infarction ([place]) 2008; ~ 2012  . Pneumonia    "get it 1-2 times/year" (06/14/2014)  . RLS (restless legs syndrome)   . Seizures ([place])   . Sleep apnea    "they want me to do the lab but I haven't" (06/14/2014)  . Stroke [facility])    "they say I've had several" (06/14/2014)  . Tobacco abuse   . Type II diabetes mellitus ([place])   . Urinary, incontinence, stress female     Patient Active Problem List   Diagnosis Date Noted  . Smoking   . Primary hypertension   . COPD exacerbation ([place]) 07/24/2018  . Chronic obstructive pulmonary disease ([place]) 07/20/2018  . Cellulitis 02/06/2018  . Sepsis ([place]) 02/06/2018  . Abnormal stress test 06/09/2017  . Unstable angina ([place]) 08/03/2016  . Morbid obesity ([place]) 05/02/2016  . Chest pain with moderate risk for cardiac etiology 03/14/2015  . CAD (coronary artery disease)   . Hospital discharge follow-up 03/07/2015  . Poorly controlled type 2 diabetes mellitus with complication ([place]) 19/62/2297  . Hyperlipidemia 06/22/2014  . Stented coronary artery   . Chronic pain syndrome   . Chest pain at rest 06/14/2014  . Acute bronchitis 06/14/2014  . Tobacco abuse 06/14/2014  . Essential hypertension 06/14/2014  . GERD (gastroesophageal reflux disease) 06/14/2014  . Bipolar disorder ([place])  06/14/2014  . Chest pain 06/14/2014    Past Surgical History:  Procedure Laterality Date  . ABDOMINAL HYSTERECTOMY  1984   "I've got 1 ovary left"  . CARDIAC CATHETERIZATION N/A 02/26/2015   Procedure: Left Heart Cath and Coronary Angiography;  Surgeon: Wellington Hampshire, MD;  Location: [place] [facility];  Service: Cardiovascular;  Laterality: N/A;  . CARDIAC CATHETERIZATION N/A 02/26/2015   Procedure: Coronary Stent Intervention;  Surgeon: Wellington Hampshire, MD;  Location: [place] [facility];  Service: Cardiovascular;  Laterality: N/A;  . CESAREAN SECTION  1984  . CORONARY ANGIOPLASTY WITH STENT PLACEMENT  2008; ~ 2012   "1; 1"  . CORONARY BALLOON ANGIOPLASTY N/A  06/09/2017   Procedure: CORONARY BALLOON ANGIOPLASTY;  Surgeon: Nelva Bush, MD;  Location: [place] [facility];  Service: Cardiovascular;  Laterality: N/A;  . CORONARY STENT INTERVENTION N/A 08/04/2016   Procedure: Coronary Stent Intervention;  Surgeon: Wellington Hampshire, MD;  Location: [name] [facility];  Service: Cardiovascular;  Laterality: N/A;  . DILATION AND CURETTAGE OF UTERUS    . EXTRACORPOREAL SHOCK WAVE LITHOTRIPSY  X 2  . I & D EXTREMITY Left 02/09/2018   Procedure: IRRIGATION AND DEBRIDEMENT EXTREMITY;  Surgeon: Herbert Pun, MD;  Location: [facility] ORS;  Service: General;  Laterality: Left;  . KNEE ARTHROSCOPY Left   . LEFT HEART CATH Bilateral 08/04/2016   Procedure: Left Heart Cath poss PCI;  Surgeon: Wellington Hampshire, MD;  Location: [place] [facility];  Service: Cardiovascular;  Laterality: Bilateral;  . LEFT HEART CATH AND CORONARY ANGIOGRAPHY Left 02/19/2017   Procedure: LEFT HEART CATH AND CORONARY ANGIOGRAPHY;  Surgeon: Minna Merritts, MD;  Location: [place] [facility];  Service: Cardiovascular;  Laterality: Left;  . LEFT HEART CATH AND CORONARY ANGIOGRAPHY Left 06/09/2017   Procedure: LEFT HEART CATH AND CORONARY ANGIOGRAPHY;  Surgeon: Nelva Bush, MD;  Location: [place] [facility];  Service: Cardiovascular;  Laterality: Left;  . LEFT HEART CATH AND CORONARY ANGIOGRAPHY N/A 04/10/2020   Procedure: LEFT HEART CATH AND CORONARY ANGIOGRAPHY;  Surgeon: Wellington Hampshire, MD;  Location: [place] [facility];  Service: Cardiovascular;  Laterality: N/A;  . LEFT HEART CATHETERIZATION WITH CORONARY ANGIOGRAM N/A 06/15/2014   Procedure: LEFT HEART CATHETERIZATION WITH CORONARY ANGIOGRAM;  Surgeon: Jettie Booze, MD;  Location: [facility] CATH LAB;  Service: Cardiovascular;  Laterality: N/A;  . PERCUTANEOUS CORONARY STENT INTERVENTION (PCI-S)  06/15/2014   Procedure: PERCUTANEOUS CORONARY STENT INTERVENTION (PCI-S);  Surgeon: Jettie Booze, MD;  Location: [facility]  CATH LAB;  Service: Cardiovascular;;  . TONSILLECTOMY    . TUBAL LIGATION      Prior to Admission medications   Medication Sig Start Date End Date Taking? Authorizing Provider  amoxicillin (AMOXIL) 500 MG capsule Take 1 capsule (500 mg total) by mouth 3 (three) times daily. 06/22/20  Yes Paulette Blanch, MD  diazepam (VALIUM) 2 MG tablet Take 1 tablet (2 mg total) by mouth every 8 (eight) hours as needed for muscle spasms. 06/22/20  Yes Paulette Blanch, MD  methylPREDNISolone (MEDROL DOSEPAK) 4 MG TBPK tablet Take as directed 06/22/20  Yes Paulette Blanch, MD  ondansetron (ZOFRAN ODT) 4 MG disintegrating tablet Take 1 tablet (4 mg total) by mouth every 8 (eight) hours as needed for nausea or vomiting. 06/22/20  Yes Paulette Blanch, MD  oxyCODONE-acetaminophen (PERCOCET/ROXICET) 5-325 MG tablet Take 1 tablet by mouth every 4 (four) hours as needed for severe pain. 06/22/20  Yes Lurline Hare  J, MD  albuterol (PROVENTIL HFA;VENTOLIN HFA) 108 (90 Base) MCG/ACT inhaler Inhale 2 puffs into the lungs every 4 (four) hours as needed for wheezing or shortness of breath.    [provider]  ALPRAZolam Duanne Moron) 0.5 MG tablet Take 1 tablet (0.5 mg total) by mouth 3 (three) times daily as needed for anxiety. 04/11/20   Ezekiel Slocumb, DO  aspirin EC 81 MG EC tablet Take 1 tablet (81 mg total) by mouth daily. 02/27/15   Gladstone Lighter, MD  atorvastatin (LIPITOR) 80 MG tablet Take 1 tablet (80 mg total) by mouth daily. 12/09/19   Rise Mu, PA-C  blood glucose meter kit and supplies KIT Dispense based on patient and insurance preference. Use up to four times daily as directed. (FOR ICD-9 250.00, 250.01). 04/11/20   Nicole Kindred A, DO  Blood Glucose Monitoring Suppl W/DEVICE KIT Test 1-2 times daily; E11.65 diagnosis 03/02/15   Rubbie Battiest, RN  busPIRone (BUSPAR) 10 MG tablet Take 1 tablet (10 mg total) by mouth 2 (two) times daily. 04/11/20   Nicole Kindred A, DO  clopidogrel (PLAVIX) 75 MG tablet TAKE 1 TABLET BY  MOUTH EVERY DAY WITH BREAKFAST. 12/09/19   Rise Mu, PA-C  cyclobenzaprine (FLEXERIL) 5 MG tablet Take 5 mg by mouth 3 (three) times daily as needed for muscle spasms.  07/17/16   [provider]  ezetimibe (ZETIA) 10 MG tablet Take 1 tablet (10 mg total) by mouth daily. 03/12/20   Minna Merritts, MD  fluticasone (FLONASE) 50 MCG/ACT nasal spray Place 2 sprays into both nostrils daily.    [provider]  furosemide (LASIX) 20 MG tablet Take 1 tablet (20 mg total) by mouth daily. 12/09/19   Dunn, Areta Haber, PA-C  gabapentin (NEURONTIN) 600 MG tablet Take 900 mg by mouth 4 (four) times daily.     [provider]  isosorbide mononitrate (IMDUR) 30 MG 24 hr tablet Take 0.5 tablets (15 mg total) by mouth daily. 04/12/20   Nicole Kindred A, DO  liraglutide (VICTOZA) 18 MG/3ML SOPN Inject 1.8 mg into the skin daily.  02/20/17   [provider]  lisinopril (ZESTRIL) 40 MG tablet Take 1 tablet (40 mg total) by mouth daily. 12/09/19   Rise Mu, PA-C  metoprolol succinate (TOPROL-XL) 50 MG 24 hr tablet Take 1 tablet (50 mg total) by mouth in the morning and at bedtime. Take with or immediately following a meal. 03/12/20   Gollan, Kathlene November, MD  nitroGLYCERIN (NITROSTAT) 0.4 MG SL tablet Place under the tongue. 02/28/19   [provider]  pantoprazole (PROTONIX) 40 MG tablet TAKE 1 TABLET(40 MG) BY MOUTH DAILY 12/09/19   Dunn, Areta Haber, PA-C  polyethylene glycol [facility] / GLYCOLAX) packet Take 17 g by mouth daily as needed for mild constipation.    [provider]  prochlorperazine (COMPAZINE) 10 MG tablet Take 1 tablet (10 mg total) by mouth every 8 (eight) hours as needed for nausea or vomiting (HEADACHE). 04/11/20   Ezekiel Slocumb, DO  QUEtiapine (SEROQUEL) 50 MG tablet Take 100-200 mg by mouth at bedtime. 08/05/19   [provider]  ranolazine (RANEXA) 500 MG 12 hr tablet Take 1 tablet (500 mg total) by mouth 2 (two) times daily. 12/09/19    Dunn, Areta Haber, PA-C  senna (SENOKOT) 8.6 MG tablet Senna Lax 8.6 mg tablet  take 2 tablets by mouth NIGHTLY AS NEEDED FOR CONSTIPATION    [provider]  tiZANidine (ZANAFLEX) 2  MG tablet Take 2 mg by mouth at bedtime. 08/29/19   [provider]  TRESIBA FLEXTOUCH 200 UNIT/ML SOPN Inject 66 Units into the skin at bedtime.     [provider]    Allergies Carbamazepine, Lithium, Lodine [etodolac], Codeine, Levofloxacin in d5w, Methocarbamol, Tape, and Relafen [nabumetone]  Family History  Problem Relation Age of Onset  . Lung cancer Mother   . Cirrhosis Father     Social History Social History   Tobacco Use  . Smoking status: Current Every Day Smoker    Packs/day: 0.25    Years: 35.00    Pack years: 8.75    Types: Cigarettes  . Smokeless tobacco: Never Used  . Tobacco comment: Patches  Vaping Use  . Vaping Use: Never used  Substance Use Topics  . Alcohol use: Yes    Alcohol/week: 0.0 standard drinks    Comment: occ  . Drug use: Yes    Types: Marijuana    Comment: past week    Review of Systems  Constitutional: No fever/chills Eyes: No visual changes. ENT: Positive for dentalgia.  No sore throat. Cardiovascular: Denies chest pain. Respiratory: Denies shortness of breath. Gastrointestinal: No abdominal pain.  No nausea, no vomiting.  No diarrhea.  No constipation. Genitourinary: Negative for dysuria. Musculoskeletal: Positive for right buttock/leg pain.  Negative for back pain. Skin: Negative for rash. Neurological: Negative for headaches, focal weakness or numbness.   ____________________________________________   PHYSICAL EXAM:  VITAL SIGNS: ED Triage Vitals  Enc Vitals Group     BP 06/21/20 2328 (!) 191/100     Pulse Rate 06/21/20 2328 (!) 109     Resp 06/21/20 2328 18     Temp 06/21/20 2328 98.1 F (36.7 C)     Temp Source 06/21/20 2328 Oral     SpO2 06/21/20 2328 95 %     Weight 06/21/20 2330 168 lb (76.2 kg)     Height  06/21/20 2330 $RemoveBefor'5\' 2"'[id number]$  (1.575 m)     Head Circumference --      Peak Flow --      Pain Score 06/21/20 2329 10     Pain Loc --      Pain Edu? --      Excl. in [place]? --     Constitutional: Alert and oriented. Well appearing and in no acute distress. Eyes: Conjunctivae are normal. PERRL. EOMI. Head: Atraumatic. Nose: No congestion/rhinnorhea. Mouth/Throat: Mucous membranes are moist.  Widespread dental caries.  Right upper and lower molar with pre-existing chipped/broken teeth which are tender to palpation with tongue blade.  There is no extraoral or intraoral swelling.   Neck: No stridor.   Cardiovascular: Normal rate, regular rhythm. Grossly normal heart sounds.  Good peripheral circulation. Respiratory: Normal respiratory effort.  No retractions. Lungs CTAB. Gastrointestinal: Soft and nontender. No distention. No abdominal bruits. No CVA tenderness. Musculoskeletal: No spinal tenderness to palpation.  Right buttock tender to palpation.  5/5 motor strength and sensation.  Negative straight leg raise.  No calf tenderness or pedal edema.  2+ distal pulses.  Brisk, less than 5-second capillary refill. No lower extremity tenderness nor edema.  No joint effusions. Neurologic:  Normal speech and language. No gross focal neurologic deficits are appreciated. No gait instability. Skin:  Skin is warm, dry and intact. No rash noted. Psychiatric: Mood and affect are normal. Speech and behavior are normal.  ____________________________________________   LABS (all labs ordered are listed, but only abnormal results are displayed)  Labs Reviewed - No  data to display ____________________________________________  EKG  None ____________________________________________  RADIOLOGY I, Lurline Hare J, personally viewed and evaluated these images (plain radiographs) as part of my medical decision making, as well as reviewing the written report by the radiologist.  ED MD interpretation: None  Official  radiology report(s): No results found.  ____________________________________________   PROCEDURES  Procedure(s) performed (including Critical Care):  Procedures   ____________________________________________   INITIAL IMPRESSION / ASSESSMENT AND PLAN / ED COURSE  As part of my medical decision making, I reviewed the following data within the [place] notes reviewed and incorporated, Notes from prior ED visits and [place] Controlled Substance Database     60 year old female presenting with dentalgia and right sciatica.  No focal neurological deficits upon examination.  Will start Amoxicillin, Medrol dose pack, Percocet for pain, Valium for muscle relaxation.  Patient requesting Zofran as pain medicines make her queasy.  Dental clinic referral sheet given.  Strict return precautions given.  Patient verbalizes understanding agrees with plan of care.      ____________________________________________   FINAL CLINICAL IMPRESSION(S) / ED DIAGNOSES  Final diagnoses:  Sciatica of right side  Dentalgia  Dental caries  Closed fracture of tooth, initial encounter     ED Discharge Orders         Ordered    methylPREDNISolone (MEDROL DOSEPAK) 4 MG TBPK tablet        06/22/20 0246    oxyCODONE-acetaminophen (PERCOCET/ROXICET) 5-325 MG tablet  Every 4 hours PRN        06/22/20 0246    diazepam (VALIUM) 2 MG tablet  Every 8 hours PRN        06/22/20 0246    amoxicillin (AMOXIL) 500 MG capsule  3 times daily        06/22/20 0249    ondansetron (ZOFRAN ODT) 4 MG disintegrating tablet  Every 8 hours PRN        06/22/20 0310          *Please note:  Jennifer [name] was evaluated in Emergency Department on 06/22/2020 for the symptoms described in the history of present illness. She was evaluated in the context of the global COVID-19 pandemic, which necessitated consideration that the patient might be at risk for infection with the SARS-CoV-2 virus that causes  COVID-19. Institutional protocols and algorithms that pertain to the evaluation of patients at risk for COVID-19 are in a state of rapid change based on information released by regulatory bodies including the CDC and federal and state organizations. These policies and algorithms were followed during the patient's care in the ED.  Some ED evaluations and interventions may be delayed as a result of limited staffing during and the pandemic.*   Note:  This document was prepared using Dragon voice recognition software and may include unintentional dictation errors.   Paulette Blanch, MD 06/22/20 [phone]

## 2020-06-27 ENCOUNTER — Other Ambulatory Visit: Payer: Self-pay | Admitting: Physician Assistant

## 2020-06-27 NOTE — Telephone Encounter (Signed)
Rx request sent to pharmacy.  

## 2020-07-25 ENCOUNTER — Other Ambulatory Visit: Payer: Self-pay | Admitting: Physician Assistant

## 2020-07-25 NOTE — Telephone Encounter (Signed)
Please call pt to reschedule follow-up appointment. Pt of Dr. Mariah Milling. No-showed appt in January with Alycia Rossetti, PA--was post Cath follow-up.

## 2020-07-25 NOTE — Telephone Encounter (Signed)
Scheduled for 4/8

## 2020-07-27 ENCOUNTER — Encounter: Payer: Self-pay | Admitting: Family

## 2020-07-27 ENCOUNTER — Other Ambulatory Visit: Payer: Self-pay

## 2020-07-27 ENCOUNTER — Other Ambulatory Visit
Admission: RE | Admit: 2020-07-27 | Discharge: 2020-07-27 | Disposition: A | Discharge status: Home / Self Care | Payer: Medicare HMO | Source: Ambulatory Visit | Attending: Family | Admitting: Family

## 2020-07-27 ENCOUNTER — Ambulatory Visit (INDEPENDENT_AMBULATORY_CARE_PROVIDER_SITE_OTHER): Payer: Medicare HMO | Admitting: Family

## 2020-07-27 ENCOUNTER — Telehealth: Payer: Self-pay | Admitting: Family

## 2020-07-27 ENCOUNTER — Emergency Department
Admission: EM | Admit: 2020-07-27 | Discharge: 2020-07-27 | Disposition: A | Discharge status: Home / Self Care | Payer: Medicare HMO | Attending: Emergency Medicine | Admitting: Emergency Medicine

## 2020-07-27 ENCOUNTER — Emergency Department: Payer: Medicare HMO

## 2020-07-27 ENCOUNTER — Encounter: Payer: Self-pay | Admitting: Emergency Medicine

## 2020-07-27 VITALS — BP 100/66 | HR 88 | Ht 62.0 in | Wt 161.0 lb

## 2020-07-27 DIAGNOSIS — N390 Urinary tract infection, site not specified: Secondary | ICD-10-CM | POA: Diagnosis not present

## 2020-07-27 DIAGNOSIS — Z7982 Long term (current) use of aspirin: Secondary | ICD-10-CM | POA: Insufficient documentation

## 2020-07-27 DIAGNOSIS — I25118 Atherosclerotic heart disease of native coronary artery with other forms of angina pectoris: Secondary | ICD-10-CM | POA: Diagnosis not present

## 2020-07-27 DIAGNOSIS — N2889 Other specified disorders of kidney and ureter: Secondary | ICD-10-CM | POA: Insufficient documentation

## 2020-07-27 DIAGNOSIS — R109 Unspecified abdominal pain: Secondary | ICD-10-CM | POA: Diagnosis present

## 2020-07-27 DIAGNOSIS — E118 Type 2 diabetes mellitus with unspecified complications: Secondary | ICD-10-CM | POA: Diagnosis not present

## 2020-07-27 DIAGNOSIS — E785 Hyperlipidemia, unspecified: Secondary | ICD-10-CM

## 2020-07-27 DIAGNOSIS — I2510 Atherosclerotic heart disease of native coronary artery without angina pectoris: Secondary | ICD-10-CM | POA: Diagnosis not present

## 2020-07-27 DIAGNOSIS — B9689 Other specified bacterial agents as the cause of diseases classified elsewhere: Secondary | ICD-10-CM | POA: Diagnosis not present

## 2020-07-27 DIAGNOSIS — J449 Chronic obstructive pulmonary disease, unspecified: Secondary | ICD-10-CM | POA: Diagnosis not present

## 2020-07-27 DIAGNOSIS — I10 Essential (primary) hypertension: Secondary | ICD-10-CM | POA: Insufficient documentation

## 2020-07-27 DIAGNOSIS — Z7902 Long term (current) use of antithrombotics/antiplatelets: Secondary | ICD-10-CM | POA: Insufficient documentation

## 2020-07-27 DIAGNOSIS — R739 Hyperglycemia, unspecified: Secondary | ICD-10-CM

## 2020-07-27 DIAGNOSIS — K219 Gastro-esophageal reflux disease without esophagitis: Secondary | ICD-10-CM | POA: Diagnosis not present

## 2020-07-27 DIAGNOSIS — E1165 Type 2 diabetes mellitus with hyperglycemia: Secondary | ICD-10-CM

## 2020-07-27 DIAGNOSIS — F17210 Nicotine dependence, cigarettes, uncomplicated: Secondary | ICD-10-CM | POA: Diagnosis not present

## 2020-07-27 DIAGNOSIS — Z79899 Other long term (current) drug therapy: Secondary | ICD-10-CM | POA: Diagnosis not present

## 2020-07-27 DIAGNOSIS — Z955 Presence of coronary angioplasty implant and graft: Secondary | ICD-10-CM | POA: Diagnosis not present

## 2020-07-27 LAB — CBG MONITORING, ED
Glucose-Capillary: 577 mg/dL (ref 70–99)
Glucose-Capillary: 415 mg/dL — ABNORMAL HIGH (ref 70–99)
Glucose-Capillary: 277 mg/dL — ABNORMAL HIGH (ref 70–99)

## 2020-07-27 LAB — CBC WITH DIFFERENTIAL/PLATELET
Abs Immature Granulocytes: 0.03 10*3/uL (ref 0.00–0.07)
Basophils Absolute: 0.1 10*3/uL (ref 0.0–0.1)
Basophils Relative: 1 %
Eosinophils Absolute: 0.2 10*3/uL (ref 0.0–0.5)
Eosinophils Relative: 2 %
HCT: 43 % (ref 36.0–46.0)
Hemoglobin: 14.4 g/dL (ref 12.0–15.0)
Immature Granulocytes: 0 %
Lymphocytes Relative: 28 %
Lymphs Abs: 2.8 10*3/uL (ref 0.7–4.0)
MCH: 31.2 pg (ref 26.0–34.0)
MCHC: 33.5 g/dL (ref 30.0–36.0)
MCV: 93.1 fL (ref 80.0–100.0)
Monocytes Absolute: 0.4 10*3/uL (ref 0.1–1.0)
Monocytes Relative: 4 %
Neutro Abs: 6.6 10*3/uL (ref 1.7–7.7)
Neutrophils Relative %: 65 %
Platelets: 270 10*3/uL (ref 150–400)
RBC: 4.62 MIL/uL (ref 3.87–5.11)
RDW: 13.3 % (ref 11.5–15.5)
WBC: 10.1 10*3/uL (ref 4.0–10.5)
nRBC: 0 % (ref 0.0–0.2)

## 2020-07-27 LAB — URINALYSIS, COMPLETE (UACMP) WITH MICROSCOPIC
Bacteria, UA: NONE SEEN
Bilirubin Urine: NEGATIVE
Glucose, UA: >=500 mg/dL — AB
Ketones, ur: NEGATIVE mg/dL
Nitrite: POSITIVE — AB
Protein, ur: NEGATIVE mg/dL
Specific Gravity, Urine: 1.03 (ref 1.005–1.030)
WBC, UA: >50 WBC/hpf — ABNORMAL HIGH (ref 0–5)
pH: 5 (ref 5.0–8.0)

## 2020-07-27 LAB — LDL CHOLESTEROL, DIRECT: Direct LDL: 183.5 mg/dL — ABNORMAL HIGH (ref 0–99)

## 2020-07-27 LAB — COMPREHENSIVE METABOLIC PANEL
ALT: 17 U/L (ref 0–44)
AST: 16 U/L (ref 15–41)
Albumin: 3.8 g/dL (ref 3.5–5.0)
Alkaline Phosphatase: 94 U/L (ref 38–126)
Anion gap: 9 (ref 5–15)
BUN: 23 mg/dL — ABNORMAL HIGH (ref 6–20)
CO2: 20 mmol/L — ABNORMAL LOW (ref 22–32)
Calcium: 9.2 mg/dL (ref 8.9–10.3)
Chloride: 102 mmol/L (ref 98–111)
Creatinine, Ser: 1.13 mg/dL — ABNORMAL HIGH (ref 0.44–1.00)
GFR, Estimated: 57 mL/min — ABNORMAL LOW (ref >60)
Glucose, Bld: 583 mg/dL (ref 70–99)
Potassium: 4.4 mmol/L (ref 3.5–5.1)
Sodium: 131 mmol/L — ABNORMAL LOW (ref 135–145)
Total Bilirubin: 0.6 mg/dL (ref 0.3–1.2)
Total Protein: 6.3 g/dL — ABNORMAL LOW (ref 6.5–8.1)

## 2020-07-27 LAB — LIPID PANEL
Cholesterol: 310 mg/dL — ABNORMAL HIGH (ref 0–200)
HDL: 33 mg/dL — ABNORMAL LOW (ref >40)
LDL Cholesterol: UNDETERMINED mg/dL (ref 0–99)
Total CHOL/HDL Ratio: 9.4 RATIO
Triglycerides: 475 mg/dL — ABNORMAL HIGH (ref <150)
VLDL: UNDETERMINED mg/dL (ref 0–40)

## 2020-07-27 MED ORDER — INSULIN ASPART 100 UNIT/ML ~~LOC~~ SOLN
10.0000 [IU] | Freq: Once | SUBCUTANEOUS | Status: AC
  Administered 2020-07-27: 10 [IU] via INTRAVENOUS
  Filled 2020-07-27: qty 1

## 2020-07-27 MED ORDER — CLOPIDOGREL BISULFATE 75 MG PO TABS: 75.0000 mg | ORAL_TABLET | Freq: Every day | ORAL | 3 refills | Status: DC

## 2020-07-27 MED ORDER — LISINOPRIL 20 MG PO TABS: 20.0000 mg | ORAL_TABLET | Freq: Every day | ORAL | 1 refills | Status: DC

## 2020-07-27 MED ORDER — IBUPROFEN 600 MG PO TABS
600.0000 mg | ORAL_TABLET | Freq: Once | ORAL | Status: AC
  Administered 2020-07-27: 600 mg via ORAL
  Filled 2020-07-27: qty 1

## 2020-07-27 MED ORDER — HYDROCODONE-ACETAMINOPHEN 5-325 MG PO TABS: 1.0000 | ORAL_TABLET | ORAL | 0 refills | Status: DC | PRN

## 2020-07-27 MED ORDER — SODIUM CHLORIDE 0.9 % IV SOLN
1.0000 g | Freq: Once | INTRAVENOUS | Status: AC
  Administered 2020-07-27: 1 g via INTRAVENOUS
  Filled 2020-07-27: qty 10

## 2020-07-27 MED ORDER — FUROSEMIDE 20 MG PO TABS: 20.0000 mg | ORAL_TABLET | ORAL | 2 refills | Status: DC | PRN

## 2020-07-27 MED ORDER — METOPROLOL SUCCINATE ER 50 MG PO TB24: 50.0000 mg | ORAL_TABLET | Freq: Two times a day (BID) | ORAL | 3 refills | Status: DC

## 2020-07-27 MED ORDER — CEPHALEXIN 500 MG PO CAPS: 500.0000 mg | ORAL_CAPSULE | Freq: Three times a day (TID) | ORAL | 0 refills | Status: DC

## 2020-07-27 MED ORDER — INSULIN ASPART 100 UNIT/ML ~~LOC~~ SOLN
6.0000 [IU] | Freq: Once | SUBCUTANEOUS | Status: AC
  Administered 2020-07-27: 6 [IU] via INTRAVENOUS
  Filled 2020-07-27: qty 1

## 2020-07-27 MED ORDER — SODIUM CHLORIDE 0.9 % IV BOLUS
1000.0000 mL | Freq: Once | INTRAVENOUS | Status: AC
  Administered 2020-07-27: 1000 mL via INTRAVENOUS

## 2020-07-27 NOTE — Telephone Encounter (Signed)
Incoming call received from [facility].  Critical lab result reported. Patient has a critical Glucose result of 583. Ordering provider Gillian Shields, NP updated. Per Caitlin's instructions the patient is to report to the ED asap for evaluaition.  Called the patient. Patient made aware of lab results and Gillian Shields, NP recommendation. Patient sts that she will need to secure child care and she will report to the [facility] ED within the hour.

## 2020-07-27 NOTE — ED Notes (Signed)
Patient transported to CT 

## 2020-07-27 NOTE — Patient Instructions (Addendum)
Medication Instructions:  Your physician has recommended you make the following change in your medication:  CHANGE Lisinopril to 20mg  daily *this is to prevent low blood pressure*  Take your Furosemide (Lasix) only as needed  If you have a dental procedure, you can hold your Plavix 5-7 days prior. We would like for you to continue your Aspirin throughout the procedure period. The dental office will likely send a note.   *If you need a refill on your cardiac medications before your next appointment, please call your pharmacy*   Lab Work: Your physician recommends that you return for lab work today: CMP, direct LDL, lipid panel  If you have labs (blood work) drawn today and your tests are completely normal, you will receive your results only by: [place] MyChart Message (if you have MyChart) OR . A paper copy in the mail If you have any lab test that is abnormal or we need to change your treatment, we will call you to review the results.  Testing/Procedures: None ordered today.   Follow-Up: At [facility], you and your health needs are our priority.  As part of our continuing mission to provide you with exceptional heart care, we have created designated Provider Care Teams.  These Care Teams include your primary Cardiologist (physician) and Advanced Practice Providers (APPs -  Physician Assistants and Nurse Practitioners) who all work together to provide you with the care you need, when you need it.  We recommend signing up for the patient portal called "MyChart".  Sign up information is provided on this After Visit Summary.  MyChart is used to connect with patients for Virtual Visits (Telemedicine).  Patients are able to view lab/test results, encounter notes, upcoming appointments, etc.  Non-urgent messages can be sent to your provider as well.   To learn more about what you can do with MyChart, go to [facility].    Your next appointment:   6 month(s)  The format for your  next appointment:   In Person  Provider:   You may see [email or web address], MD or one of the following Advanced Practice Providers on your designated Care Team:    Julien Nordmann, NP  Nicolasa Ducking, PA-C  Eula Listen, PA-C  Cadence Marisue Ivan, Fransico Michael  [place], NP  Other Instructions  Our goal is for your blood pressure to be less than 130/80. We have reduced your blood pressure today to prevent lightheadedness and dizziness. If your blood pressure is consistently more than 130/80 or less than 110/60, please call Gillian Shields.   Heart Healthy Diet Recommendations: A low-salt diet is recommended. Meats should be grilled, baked, or boiled. Avoid fried foods. Focus on lean protein sources like fish or chicken with vegetables and fruits. The American Heart Association is a [place]!  American Heart Association Diet and Lifeystyle Recommendations   Exercise recommendations: The American Heart Association recommends 150 minutes of moderate intensity exercise weekly. Try 30 minutes of moderate intensity exercise 4-5 times per week. This could include walking, jogging, or swimming.    Managing the Challenge of Quitting Smoking Quitting smoking is a physical and mental challenge. You will face cravings, withdrawal symptoms, and temptation. Before quitting, work with your health care provider to make a plan that can help you manage quitting. Preparation can help you quit and keep you from giving in. How to manage lifestyle changes Managing stress Stress can make you want to smoke, and wanting to smoke may cause stress. It is important to find ways to manage  your stress. You might try some of the following:  Practice relaxation techniques. ? Breathe slowly and deeply, in through your nose and out through your mouth. ? Listen to music. ? Soak in a bath or take a shower. ? Imagine a peaceful place or vacation.  Get some support. ? Talk with family or friends about your stress. ? Join a  support group. ? Talk with a counselor or therapist.  Get some physical activity. ? Go for a walk, run, or bike ride. ? Play a favorite sport. ? Practice yoga.   Medicines Talk with your health care provider about medicines that might help you deal with cravings and make quitting easier for you. Relationships Social situations can be difficult when you are quitting smoking. To manage this, you can:  Avoid parties and other social situations where people might be smoking.  Avoid alcohol.  Leave right away if you have the urge to smoke.  Explain to your family and friends that you are quitting smoking. Ask for support and let them know you might be a bit grumpy.  Plan activities where smoking is not an option. General instructions Be aware that many people gain weight after they quit smoking. However, not everyone does. To keep from gaining weight, have a plan in place before you quit and stick to the plan after you quit. Your plan should include:  Having healthy snacks. When you have a craving, it may help to: ? Eat popcorn, carrots, celery, or other cut vegetables. ? Chew sugar-free gum.  Changing how you eat. ? Eat small portion sizes at meals. ? Eat 4-6 small meals throughout the day instead of 1-2 large meals a day. ? Be mindful when you eat. Do not watch television or do other things that might distract you as you eat.  Exercising regularly. ? Make time to exercise each day. If you do not have time for a long workout, do short bouts of exercise for 5-10 minutes several times a day. ? Do some form of strengthening exercise, such as weight lifting. ? Do some exercise that gets your heart beating and causes you to breathe deeply, such as walking fast, running, swimming, or biking. This is very important.  Drinking plenty of water or other low-calorie or no-calorie drinks. Drink 6-8 glasses of water daily.   How to recognize withdrawal symptoms Your body and mind may  experience discomfort as you try to get used to not having nicotine in your system. These effects are called withdrawal symptoms. They may include:  Feeling hungrier than normal.  Having trouble concentrating.  Feeling irritable or restless.  Having trouble sleeping.  Feeling depressed.  Craving a cigarette. To manage withdrawal symptoms:  Avoid places, people, and activities that trigger your cravings.  Remember why you want to quit.  Get plenty of sleep.  Avoid coffee and other caffeinated drinks. These may worsen some of your symptoms. These symptoms may surprise you. But be assured that they are normal to have when quitting smoking. How to manage cravings Come up with a plan for how to deal with your cravings. The plan should include the following:  A definition of the specific situation you want to deal with.  An alternative action you will take.  A clear idea for how this action will help.  The name of someone who might help you with this. Cravings usually last for 5-10 minutes. Consider taking the following actions to help you with your plan to deal with cravings:  Keep your mouth busy. ? Chew sugar-free gum. ? Suck on hard candies or a straw. ? Brush your teeth.  Keep your hands and body busy. ? Change to a different activity right away. ? Squeeze or play with a ball. ? Do an activity or a hobby, such as making bead jewelry, practicing needlepoint, or working with wood. ? Mix up your normal routine. ? Take a short exercise break. Go for a quick walk or run up and down stairs.  Focus on doing something kind or helpful for someone else.  Call a friend or family member to talk during a craving.  Join a support group.  Contact a quitline. Where to find support To get help or find a support group:  Call the National Cancer Institute's Smoking Quitline: 1-800-QUIT NOW 757-108-6966)  Visit the website of the Substance Abuse and Mental Health Services  Administration: SkateOasis.com.pt  Text QUIT to SmokefreeTXT: 683419 Where to find more information Visit these websites to find more information on quitting smoking:  National Cancer Institute: www.smokefree.gov  American Lung Association: www.lung.org  American Cancer Society: www.cancer.org  Centers for Disease Control and Prevention: FootballExhibition.com.br  American Heart Association: www.heart.org Contact a health care provider if:  You want to change your plan for quitting.  The medicines you are taking are not helping.  Your eating feels out of control or you cannot sleep. Get help right away if:  You feel depressed or become very anxious. Summary  Quitting smoking is a physical and mental challenge. You will face cravings, withdrawal symptoms, and temptation to smoke again. Preparation can help you as you go through these challenges.  Try different techniques to manage stress, handle social situations, and prevent weight gain.  You can deal with cravings by keeping your mouth busy (such as by chewing gum), keeping your hands and body busy, calling family or friends, or contacting a quitline for people who want to quit smoking.  You can deal with withdrawal symptoms by avoiding places where people smoke, getting plenty of rest, and avoiding drinks with caffeine. This information is not intended to replace advice given to you by your health care provider. Make sure you discuss any questions you have with your health care provider. Document Revised: 01/25/2019 Document Reviewed: 01/25/2019 Elsevier Patient Education  2021 [employer].

## 2020-07-27 NOTE — Progress Notes (Signed)
Office Visit    Patient Name: [name] Date of Encounter: 07/27/2020  PCP:  Mateo Flow, MD   [place]  Cardiologist:  [name], MD  Advanced Practice Provider:  No care team member to display Electrophysiologist:  None    Chief Complaint    Jennifer [name] is a 60 y.o. female with a hx of CAD s/p multiple prior stents, diastolic dysfunction, DM2, HTN, HLD, sleep apnea not on CPAP, tobacco use, PAD, chronic pain, obesity, recurrent nephrolithiasis, anxiety, depression  presents today for follow up of coronary artery disease.   Past Medical History    Past Medical History:  Diagnosis Date  . Anxiety   . Arthritis    "qwhere" (06/14/2014)  . Bipolar disorder ([place])   . CAD (coronary artery disease)    a. 2008 PCI/DES to RCA; b. 2012 PCI: 95% mRCA s/p PCI/DES; b. 2/16 PCI DES-> 90% dRCA; c. 11/16 PCI: dRCA ISR s/p PCI/DES;  d. 4/18 PCI: p-mRCA 95% s/p PCI/DES; e. 02/2017 Cath: LML nl, LAD nl, D1 40, LCX min irregs, OM1 60, RCA 50-60p/m ISR, 53md, patent dRCA stent, RPDA 90ost (fills via L->R collats), EF 55-65%.  . Chronic bronchitis ([place]    "get it q yr"  . Daily headache    "when my blood pressure is up" (06/14/2014)  . Depression   . Diastolic dysfunction    a. 02/2017 Echo: EF 55-60%, no rwma, Gr1 DD, nl RV fxn.  .Marland KitchenGERD (gastroesophageal reflux disease)   . Heart murmur   . High cholesterol   . History of stomach ulcers   . Hypertension   . Insomnia   . Kidney stones   . Migraine    "haven't had them in a good while; did get them 1-2 times/yr" (06/14/2014)  . Myocardial infarction ([place]; ~ 2012  . Pneumonia    "get it 1-2 times/year" (06/14/2014)  . RLS (restless legs syndrome)   . Seizures ([place]   . Sleep apnea    "they want me to do the lab but I haven't" (06/14/2014)  . Stroke ([facility]    "they say I've had several" (06/14/2014)  . Tobacco abuse   . Type II diabetes mellitus ([place]   . Urinary, incontinence, stress  female    Past Surgical History:  Procedure Laterality Date  . ABDOMINAL HYSTERECTOMY  1984   "I've got 1 ovary left"  . CARDIAC CATHETERIZATION N/A 02/26/2015   Procedure: Left Heart Cath and Coronary Angiography;  Surgeon: MWellington Hampshire MD;  [place];  Service: Cardiovascular;  Laterality: N/A;  . CARDIAC CATHETERIZATION N/A 02/26/2015   Procedure: Coronary Stent Intervention;  Surgeon: MWellington Hampshire MD;  [place];  Service: Cardiovascular;  Laterality: N/A;  . CESAREAN SECTION  1984  . CORONARY ANGIOPLASTY WITH STENT PLACEMENT  2008; ~ 2012   "1; 1"  . CORONARY BALLOON ANGIOPLASTY N/A 06/09/2017   Procedure: CORONARY BALLOON ANGIOPLASTY;  Surgeon: ENelva Bush MD;  [place] [facility];  Service: Cardiovascular;  Laterality: N/A;  . CORONARY STENT INTERVENTION N/A 08/04/2016   Procedure: Coronary Stent Intervention;  Surgeon: MWellington Hampshire MD;  [place];  Service: Cardiovascular;  Laterality: N/A;  . DILATION AND CURETTAGE OF UTERUS    . EXTRACORPOREAL SHOCK WAVE LITHOTRIPSY  X 2  . I & D EXTREMITY Left 02/09/2018   Procedure: IRRIGATION AND DEBRIDEMENT EXTREMITY;  Surgeon: CHerbert Pun MD;  Location: [facility] ORS;  Service: General;  Laterality: Left;  . KNEE ARTHROSCOPY Left   . LEFT HEART CATH Bilateral 08/04/2016   Procedure: Left Heart Cath poss PCI;  Surgeon: Wellington Hampshire, MD;  Location: [place] [facility] LAB;  Service: Cardiovascular;  Laterality: Bilateral;  . LEFT HEART CATH AND CORONARY ANGIOGRAPHY Left 02/19/2017   Procedure: LEFT HEART CATH AND CORONARY ANGIOGRAPHY;  Surgeon: Minna Merritts, MD;  Location: [place] [facility] LAB;  Service: Cardiovascular;  Laterality: Left;  . LEFT HEART CATH AND CORONARY ANGIOGRAPHY Left 06/09/2017   Procedure: LEFT HEART CATH AND CORONARY ANGIOGRAPHY;  Surgeon: Nelva Bush, MD;  Location: [place] [facility] LAB;  Service: Cardiovascular;   Laterality: Left;  . LEFT HEART CATH AND CORONARY ANGIOGRAPHY N/A 04/10/2020   Procedure: LEFT HEART CATH AND CORONARY ANGIOGRAPHY;  Surgeon: Wellington Hampshire, MD;  Location: [place] [facility] LAB;  Service: Cardiovascular;  Laterality: N/A;  . LEFT HEART CATHETERIZATION WITH CORONARY ANGIOGRAM N/A 06/15/2014   Procedure: LEFT HEART CATHETERIZATION WITH CORONARY ANGIOGRAM;  Surgeon: Jettie Booze, MD;  Location: [facility] CATH LAB;  Service: Cardiovascular;  Laterality: N/A;  . PERCUTANEOUS CORONARY STENT INTERVENTION (PCI-S)  06/15/2014   Procedure: PERCUTANEOUS CORONARY STENT INTERVENTION (PCI-S);  Surgeon: Jettie Booze, MD;  Location: [facility] CATH LAB;  Service: Cardiovascular;;  . TONSILLECTOMY    . TUBAL LIGATION      Allergies  Allergies  Allergen Reactions  . Carbamazepine Anaphylaxis and Other (See Comments)    Blood dyscrasia when on with Lithium  . Lithium Other (See Comments)    Blood dyscrasia  . Lodine [Etodolac] Shortness Of Breath, Diarrhea, Nausea And Vomiting and Rash  . Codeine Other (See Comments)    Upset stomach  . Levofloxacin In D5w Swelling and Other (See Comments)    "Salty" sensation in mouth. "Hard time to explain it"  . Methocarbamol Other (See Comments)    Unknown  . Tape Other (See Comments)    Welps, blister on skin  . Relafen [Nabumetone] Diarrhea, Nausea And Vomiting and Rash    History of Present Illness    Jennifer [name] is a 60 y.o. female with a hx of  CAD s/p multiple prior stents, diastolic dysfunction, DM2, HTN, HLD, sleep apnea not on CPAP, tobacco use, PAD, chronic pain, obesity, recurrent nephrolithiasis, anxiety, depression. She was last seen 03/2020 for cardiac catheterization..  Coronary artery disease dates back to 2008 DES to RCA and subsequent 2010 DES to mid RCA. She had repeat catheterizations from anginal symptoms in 2016 and 2018. April 2018 she had LHC with PCi/DES to prox/mis RCA wtihin the previously placed stent. [place] 01/2017  with recommendation for medical management. LHC 05/2017 with focal 95% ISR requiring PTCA to in stent restenosis of the RCA recommended for indefinite DAPT. She was hospitalized December 2021 due to chest pain with [facility] showing significant one-vessel coronary disease with patent stent in the prox/mid RCA and distal RCA with mild ISR. Moderate stenosis in mid to distal RCA and significant stenosis in small diameter right PDA is unchanged from previous. Recommended for medical management and aggressive secondary prevention. Echo 04/10/20 LVEF 60-65%, no RWMA, mild LVH, RV normal size and function, no significant valvular abnormality.   Shares with me that she has had a difficult few years after being evicted from her home and that her children who she lives with are about to be evicted as well. Her young grandson is with her today and she enjoys watching him. She  has 6 broken teeth that need removed though has not yet seen a dentist or oral surgeon and is requesting cardiac clearance. She reports she only notes chest pain in very stressful situations but nothing similar to her previous anginal symptoms. Reports no shortness of breath nor dyspnea on exertion. Notes that she has not been taking her Aspirin consistently but does take Plavix. She has not been taking Metformin nor Insulin as she tells me her continued weight loss she presumes has improved her diabetes. Her A1c 04/09/20 was 14.6. She has not beel following with the pain management clinic, psychiatry, nor her primary care.    EKGs/Labs/Other Studies Reviewed:   The following studies were reviewed today:  LHC 04/10/20  1st Diag lesion is 40% stenosed.  1st Mrg lesion is 25% stenosed.  Non-stenotic Dist RCA lesion was previously treated.  Mid RCA lesion is 40% stenosed.  Ost RPDA to RPDA lesion is 80% stenosed.  Prox RCA to Mid RCA lesion is 20% stenosed.   1.  Significant one-vessel coronary artery disease with patent stents in the  proximal/mid RCA as well as distal RCA with mild in-stent restenosis.  Moderate stenosis in the mid to distal RCA and significant stenosis in a small diameter right PDA is unchanged from most recent catheterization and if anything the 2 lesions look better.  Mild disease affecting the left system. 2.  Left ventricular angiography was not performed.  EF was normal by echo.  Mildly elevated left ventricular end-diastolic pressure.   Recommendations: I do not see a clear culprit for the patient's symptoms of unstable angina.  She does have small vessel disease but it actually appears better than most recent angiography in 2018.  I recommend continuing medical therapy.  I do suspect that anxiety is significantly contributing to her symptoms.  EKG:  No EKG today.  Recent Labs: 12/09/2019: ALT 17 04/11/2020: BUN 17; Creatinine, Ser 0.77; Hemoglobin 13.9; Magnesium 2.0; Platelets 212; Potassium 4.5; Sodium 136  Recent Lipid Panel    Component Value Date/Time   CHOL 206 (H) 04/10/2020 0415   CHOL 170 12/09/2019 1528   TRIG 660 (H) 04/10/2020 0415   HDL 27 (L) 04/10/2020 0415   HDL 38 (L) 12/09/2019 1528   CHOLHDL 7.6 04/10/2020 0415   VLDL UNABLE TO CALCULATE IF TRIGLYCERIDE OVER 400 mg/dL 04/10/2020 0415   LDLCALC UNABLE TO CALCULATE IF TRIGLYCERIDE OVER 400 mg/dL 04/10/2020 0415   LDLCALC 103 (H) 12/09/2019 1528   LDLDIRECT 127.2 (H) 04/10/2020 0415     Home Medications   Current Meds  Medication Sig  . albuterol (PROVENTIL HFA;VENTOLIN HFA) 108 (90 Base) MCG/ACT inhaler Inhale 2 puffs into the lungs every 4 (four) hours as needed for wheezing or shortness of breath.  [name] aspirin EC 81 MG EC tablet Take 1 tablet (81 mg total) by mouth daily.  . blood glucose meter kit and supplies KIT Dispense based on patient and insurance preference. Use up to four times daily as directed. (FOR ICD-9 250.00, 250.01).  . Blood Glucose Monitoring Suppl W/DEVICE KIT Test 1-2 times daily; E11.65 diagnosis  .  clopidogrel (PLAVIX) 75 MG tablet TAKE 1 TABLET BY MOUTH EVERY DAY WITH BREAKFAST  . lisinopril (ZESTRIL) 40 MG tablet Take 1 tablet (40 mg total) by mouth daily.  . metoprolol succinate (TOPROL-XL) 50 MG 24 hr tablet Take 1 tablet (50 mg total) by mouth in the morning and at bedtime. Take with or immediately following a meal.  . nitroGLYCERIN (NITROSTAT) 0.4 MG SL  tablet Place under the tongue.  . ondansetron (ZOFRAN ODT) 4 MG disintegrating tablet Take 1 tablet (4 mg total) by mouth every 8 (eight) hours as needed for nausea or vomiting.  . ranolazine (RANEXA) 500 MG 12 hr tablet Take 1 tablet (500 mg total) by mouth 2 (two) times daily.     Review of Systems  dAll other systems reviewed and are otherwise negative except as noted above.  Physical Exam    VS:  BP 100/66 (BP Location: Left Arm, Patient Position: Sitting, Cuff Size: Normal)   Pulse 88   Ht 5' 2" (1.575 m)   Wt 161 lb (73 kg)   SpO2 95%   BMI 29.45 kg/m  , BMI Body mass index is 29.45 kg/m.  Wt Readings from Last 3 Encounters:  07/27/20 161 lb (73 kg)  04/11/20 184 lb 1.6 oz (83.5 kg)  03/12/20 183 lb (83 kg)     GEN: Well nourished, well developed, in no acute distress. HEENT: normal. Neck: Supple, no JVD, carotid bruits, or masses. Cardiac: RRR, no murmurs, rubs, or gallops. No clubbing, cyanosis, edema.  Radials/DP/PT 2+ and equal bilaterally.  Respiratory:  Respirations regular and unlabored, clear to auscultation bilaterally. GI: Soft, nontender, nondistended. MS: No deformity or atrophy. Skin: Warm and dry, no rash. Neuro:  Strength and sensation are intact. Psych: Normal affect.  Assessment & Plan    1. CAD s/p PCI to RCA / HLD, LDL goal <70 - Stable with no anginal symptoms. No indication for ischemic evaluation. GDMT includes aspirin, plavix, toprol, ranexa. Refills were provided. She has not been taking statin, will reassess lipid panel and CMP to assess dose of statin to resume. In regards to future  dental procedures, would be appropriate to hold plavix with prompt resumption but she should continue aspirin throughout the periprocedureal period.  2. HTN - Reports lightheadedness. Reduce Lisinopril form 25m to 211mdaily. Continue Toprol 5024mwice daily.  3. Tobacco use - Smoking cessation encouraged. Recommend utilization of [id number].  4. DM2 - A1c uncontrolled. Not taking Metformin nor Insulin. Strongly encouraged follow up with PCP.   Disposition: Follow up in 6 month(s) with Dr. GolRockey Situ APP   Signed, CaiLoel DubonnetP 07/27/2020, 3:21 PM [place]

## 2020-07-27 NOTE — Telephone Encounter (Signed)
Lab calling with  results

## 2020-07-27 NOTE — ED Provider Notes (Signed)
[facility] Emergency Department Provider Note  Time seen: 6:08 PM  I have reviewed the triage vital signs and the nursing notes.   HISTORY  Chief Complaint Hyperglycemia   HPI Jennifer [name] is a 60 y.o. female with a past medical history of anxiety, bipolar, CAD, depression, hypertension, hyperlipidemia, diabetes who presents emergency department for elevated blood sugar.  According to the patient for the past 1 month she has been experiencing intermittent right flank pain for last few days she is now having urinary frequency and mild burning upon urination.  Patient saw her doctor and was called back saying her blood sugar was elevated.  Patient states her blood sugars been running normal or low sometimes her she has not been using insulin over the past month.  Patient denies any nausea vomiting or diarrhea.  No fever.   Past Medical History:  Diagnosis Date  . Anxiety   . Arthritis    "qwhere" (06/14/2014)  . Bipolar disorder ([place])   . CAD (coronary artery disease)    a. 2008 PCI/DES to RCA; b. 2012 PCI: 95% mRCA s/p PCI/DES; b. 2/16 PCI DES-> 90% dRCA; c. 11/16 PCI: dRCA ISR s/p PCI/DES;  d. 4/18 PCI: p-mRCA 95% s/p PCI/DES; e. 02/2017 Cath: LML nl, LAD nl, D1 40, LCX min irregs, OM1 60, RCA 50-60p/m ISR, 46m/d, patent dRCA stent, RPDA 90ost (fills via L->R collats), EF 55-65%.  . Chronic bronchitis ([place])    "get it q yr"  . Daily headache    "when my blood pressure is up" (06/14/2014)  . Depression   . Diastolic dysfunction    a. 02/2017 Echo: EF 55-60%, no rwma, Gr1 DD, nl RV fxn.  [name] GERD (gastroesophageal reflux disease)   . Heart murmur   . High cholesterol   . History of stomach ulcers   . Hypertension   . Insomnia   . Kidney stones   . Migraine    "haven't had them in a good while; did get them 1-2 times/yr" (06/14/2014)  . Myocardial infarction ([place]) 2008; ~ 2012  . Pneumonia    "get it 1-2 times/year" (06/14/2014)  . RLS (restless legs  syndrome)   . Seizures ([place])   . Sleep apnea    "they want me to do the lab but I haven't" (06/14/2014)  . Stroke [facility])    "they say I've had several" (06/14/2014)  . Tobacco abuse   . Type II diabetes mellitus ([place])   . Urinary, incontinence, stress female     Patient Active Problem List   Diagnosis Date Noted  . Smoking   . Primary hypertension   . COPD exacerbation ([place]) 07/24/2018  . Chronic obstructive pulmonary disease ([place]) 07/20/2018  . Cellulitis 02/06/2018  . Sepsis ([place]) 02/06/2018  . Abnormal stress test 06/09/2017  . Unstable angina ([place]) 08/03/2016  . Morbid obesity ([place]) 05/02/2016  . Chest pain with moderate risk for cardiac etiology 03/14/2015  . CAD (coronary artery disease)   . Hospital discharge follow-up 03/07/2015  . Poorly controlled type 2 diabetes mellitus with complication ([place]) 30/10/6224  . Hyperlipidemia 06/22/2014  . Stented coronary artery   . Chronic pain syndrome   . Chest pain at rest 06/14/2014  . Acute bronchitis 06/14/2014  . Tobacco abuse 06/14/2014  . Essential hypertension 06/14/2014  . GERD (gastroesophageal reflux disease) 06/14/2014  . Bipolar disorder ([place]) 06/14/2014  . Chest pain 06/14/2014    Past Surgical History:  Procedure Laterality Date  . ABDOMINAL HYSTERECTOMY  1984   "I've got 1 ovary left"  . CARDIAC CATHETERIZATION N/A 02/26/2015   Procedure: Left Heart Cath and Coronary Angiography;  Surgeon: Wellington Hampshire, MD;  Location: [place] CV LAB;  Service: Cardiovascular;  Laterality: N/A;  . CARDIAC CATHETERIZATION N/A 02/26/2015   Procedure: Coronary Stent Intervention;  Surgeon: Wellington Hampshire, MD;  Location: [place] [facility];  Service: Cardiovascular;  Laterality: N/A;  . CESAREAN SECTION  1984  . CORONARY ANGIOPLASTY WITH STENT PLACEMENT  2008; ~ 2012   "1; 1"  . CORONARY BALLOON ANGIOPLASTY N/A 06/09/2017   Procedure: CORONARY BALLOON ANGIOPLASTY;  Surgeon: Nelva Bush, MD;  Location: [place]  CV LAB;  Service: Cardiovascular;  Laterality: N/A;  . CORONARY STENT INTERVENTION N/A 08/04/2016   Procedure: Coronary Stent Intervention;  Surgeon: Wellington Hampshire, MD;  Location: [place] [facility] LAB;  Service: Cardiovascular;  Laterality: N/A;  . DILATION AND CURETTAGE OF UTERUS    . EXTRACORPOREAL SHOCK WAVE LITHOTRIPSY  X 2  . I & D EXTREMITY Left 02/09/2018   Procedure: IRRIGATION AND DEBRIDEMENT EXTREMITY;  Surgeon: Herbert Pun, MD;  Location: [facility] ORS;  Service: General;  Laterality: Left;  . KNEE ARTHROSCOPY Left   . LEFT HEART CATH Bilateral 08/04/2016   Procedure: Left Heart Cath poss PCI;  Surgeon: Wellington Hampshire, MD;  Location: [place] [facility];  Service: Cardiovascular;  Laterality: Bilateral;  . LEFT HEART CATH AND CORONARY ANGIOGRAPHY Left 02/19/2017   Procedure: LEFT HEART CATH AND CORONARY ANGIOGRAPHY;  Surgeon: Minna Merritts, MD;  Location: [place] CV LAB;  Service: Cardiovascular;  Laterality: Left;  . LEFT HEART CATH AND CORONARY ANGIOGRAPHY Left 06/09/2017   Procedure: LEFT HEART CATH AND CORONARY ANGIOGRAPHY;  Surgeon: Nelva Bush, MD;  Location: [place] CV LAB;  Service: Cardiovascular;  Laterality: Left;  . LEFT HEART CATH AND CORONARY ANGIOGRAPHY N/A 04/10/2020   Procedure: LEFT HEART CATH AND CORONARY ANGIOGRAPHY;  Surgeon: Wellington Hampshire, MD;  Location: [place] CV LAB;  Service: Cardiovascular;  Laterality: N/A;  . LEFT HEART CATHETERIZATION WITH CORONARY ANGIOGRAM N/A 06/15/2014   Procedure: LEFT HEART CATHETERIZATION WITH CORONARY ANGIOGRAM;  Surgeon: Jettie Booze, MD;  Location: [facility] CATH LAB;  Service: Cardiovascular;  Laterality: N/A;  . PERCUTANEOUS CORONARY STENT INTERVENTION (PCI-S)  06/15/2014   Procedure: PERCUTANEOUS CORONARY STENT INTERVENTION (PCI-S);  Surgeon: Jettie Booze, MD;  Location: [facility] CATH LAB;  Service: Cardiovascular;;  . TONSILLECTOMY    . TUBAL LIGATION      Prior to Admission  medications   Medication Sig Start Date End Date Taking? Authorizing Provider  albuterol (PROVENTIL HFA;VENTOLIN HFA) 108 (90 Base) MCG/ACT inhaler Inhale 2 puffs into the lungs every 4 (four) hours as needed for wheezing or shortness of breath.    [provider]  aspirin EC 81 MG EC tablet Take 1 tablet (81 mg total) by mouth daily. 02/27/15   Gladstone Lighter, MD  blood glucose meter kit and supplies KIT Dispense based on patient and insurance preference. Use up to four times daily as directed. (FOR ICD-9 250.00, 250.01). 04/11/20   Nicole Kindred A, DO  Blood Glucose Monitoring Suppl W/DEVICE KIT Test 1-2 times daily; E11.65 diagnosis 03/02/15   Rubbie Battiest, RN  clopidogrel (PLAVIX) 75 MG tablet Take 1 tablet (75 mg total) by mouth daily. 07/27/20   Loel Dubonnet, NP  furosemide (LASIX) 20 MG tablet Take 1 tablet (20 mg total) by mouth as needed for fluid or edema.  07/27/20   Loel Dubonnet, NP  lisinopril (ZESTRIL) 20 MG tablet Take 1 tablet (20 mg total) by mouth daily. 07/27/20 01/23/21  Loel Dubonnet, NP  metoprolol succinate (TOPROL-XL) 50 MG 24 hr tablet Take 1 tablet (50 mg total) by mouth in the morning and at bedtime. Take with or immediately following a meal. 07/27/20   Loel Dubonnet, NP  nitroGLYCERIN (NITROSTAT) 0.4 MG SL tablet Place under the tongue. 02/28/19   [provider]  ondansetron (ZOFRAN ODT) 4 MG disintegrating tablet Take 1 tablet (4 mg total) by mouth every 8 (eight) hours as needed for nausea or vomiting. 06/22/20   Paulette Blanch, MD  ranolazine (RANEXA) 500 MG 12 hr tablet Take 1 tablet (500 mg total) by mouth 2 (two) times daily. 12/09/19   Rise Mu, PA-C    Allergies  Allergen Reactions  . Carbamazepine Anaphylaxis and Other (See Comments)    Blood dyscrasia when on with Lithium  . Lithium Other (See Comments)    Blood dyscrasia  . Lodine [Etodolac] Shortness Of Breath, Diarrhea, Nausea And Vomiting and Rash  . Codeine Other (See  Comments)    Upset stomach  . Levofloxacin In D5w Swelling and Other (See Comments)    "Salty" sensation in mouth. "Hard time to explain it"  . Methocarbamol Other (See Comments)    Unknown  . Tape Other (See Comments)    Welps, blister on skin  . Relafen [Nabumetone] Diarrhea, Nausea And Vomiting and Rash    Family History  Problem Relation Age of Onset  . Lung cancer Mother   . Cirrhosis Father     Social History Social History   Tobacco Use  . Smoking status: Current Every Day Smoker    Packs/day: 0.25    Years: 35.00    Pack years: 8.75    Types: Cigarettes  . Smokeless tobacco: Never Used  . Tobacco comment: Patches  Vaping Use  . Vaping Use: Never used  Substance Use Topics  . Alcohol use: Yes    Alcohol/week: 0.0 standard drinks    Comment: occ  . Drug use: Yes    Types: Marijuana    Comment: past week    Review of Systems Constitutional: Negative for fever. Cardiovascular: Negative for chest pain. Respiratory: Negative for shortness of breath. Gastrointestinal: Admits right flank pain Genitourinary: Mild dysuria Musculoskeletal: Negative for musculoskeletal complaints Neurological: Negative for headache All other ROS negative  ____________________________________________   PHYSICAL EXAM:  VITAL SIGNS: ED Triage Vitals  Enc Vitals Group     BP 07/27/20 1747 138/78     Pulse Rate 07/27/20 1747 88     Resp 07/27/20 1747 20     Temp 07/27/20 1747 98 F (36.7 C)     Temp Source 07/27/20 1747 Oral     SpO2 07/27/20 1747 97 %     Weight 07/27/20 1747 161 lb (73 kg)     Height 07/27/20 1747 $RemoveBefor'5\' 2"'[id number]$  (1.575 m)     Head Circumference --      Peak Flow --      Pain Score 07/27/20 1741 7     Pain Loc --      Pain Edu? --      Excl. in [place]? --     Constitutional: Alert and oriented. Well appearing and in no distress. Eyes: Normal exam ENT      Head: Normocephalic and atraumatic.      Mouth/Throat: Mucous membranes are moist. Cardiovascular: Normal  rate, regular rhythm. No murmur Respiratory: Normal respiratory effort without tachypnea nor retractions. Breath sounds are clear  Gastrointestinal: Soft and nontender. No distention.   Musculoskeletal: Nontender with normal range of motion in all extremities. Neurologic:  Normal speech and language. No gross focal neurologic deficits are appreciated. Skin:  Skin is warm, dry and intact.  Psychiatric: Mood and affect are normal. Speech and behavior are normal.   ____________________________________________      RADIOLOGY  CT scan negative for acute abnormality.  ____________________________________________   INITIAL IMPRESSION / ASSESSMENT AND PLAN / ED COURSE  Pertinent labs & imaging results that were available during my care of the patient were reviewed by me and considered in my medical decision making (see chart for details).   Patient presents emergency department for right flank pain intermittent x1 month now with hyperglycemia sent by her PCP.  Has not been using her insulin for the past 1 month.  We will check labs, IV hydrate obtain a urinalysis and obtain a CT renal scan to further evaluate.  Differential would include UTI, pyonephritis, hyperglycemia, DKA, ureterolithiasis.  CT scan negative.  Urinalysis consistent with urinary tract infection.  Will treat with IV Rocephin.  Patient's blood glucose is elevated after 10 units of insulin still remains elevated 470 we will dose additional liter fluid and 6 additional units of insulin and recheck.  Reassuringly anion gap is negative no signs of DKA.  Anticipate likely discharge home with antibiotics.  Jennifer [name] was evaluated in Emergency Department on 07/27/2020 for the symptoms described in the history of present illness. She was evaluated in the context of the global COVID-19 pandemic, which necessitated consideration that the patient might be at risk for infection with the SARS-CoV-2 virus that causes COVID-19. Institutional  protocols and algorithms that pertain to the evaluation of patients at risk for COVID-19 are in a state of rapid change based on information released by regulatory bodies including the CDC and federal and state organizations. These policies and algorithms were followed during the patient's care in the ED.  ____________________________________________   FINAL CLINICAL IMPRESSION(S) / ED DIAGNOSES  Urinary tract infection Hyperglycemia   Harvest Dark, MD 08/06/20 2047

## 2020-07-27 NOTE — ED Notes (Signed)
Patient is resting comfortably. Call light in reach. Fall precautions in place.  

## 2020-07-27 NOTE — ED Triage Notes (Signed)
Pt to ED via POV with c/o R flank pain, dysuria and hyperglycemia. Pt states seen at heart doctor earlier today and had blood work done, pt states was called and told that her blood sugar was over 500. Pt states was going to come anyway due to feeling like she has a UTI.

## 2020-07-27 NOTE — Discharge Instructions (Addendum)
Please begin taking your home diabetic medications again including insulin.  Please check your blood sugar 3 times daily and drink plenty of nonsugary fluids.  Please complete the course of antibiotics as prescribed even if feeling better.  Return to the emergency department for any fever, you continue to have significantly elevated blood sugars, or any other symptom personally concerning to yourself.

## 2020-07-28 LAB — COMPREHENSIVE METABOLIC PANEL
ALT: 11 U/L (ref 0–44)
AST: 15 U/L (ref 15–41)
Albumin: 2.7 g/dL — ABNORMAL LOW (ref 3.5–5.0)
Alkaline Phosphatase: 76 U/L (ref 38–126)
Anion gap: 7 (ref 5–15)
BUN: 19 mg/dL (ref 6–20)
CO2: 17 mmol/L — ABNORMAL LOW (ref 22–32)
Calcium: 8 mg/dL — ABNORMAL LOW (ref 8.9–10.3)
Chloride: 110 mmol/L (ref 98–111)
Creatinine, Ser: 0.79 mg/dL (ref 0.44–1.00)
GFR, Estimated: >60 mL/min (ref >60)
Glucose, Bld: 476 mg/dL — ABNORMAL HIGH (ref 70–99)
Potassium: 3 mmol/L — ABNORMAL LOW (ref 3.5–5.1)
Sodium: 134 mmol/L — ABNORMAL LOW (ref 135–145)
Total Bilirubin: 0.4 mg/dL (ref 0.3–1.2)
Total Protein: 4.6 g/dL — ABNORMAL LOW (ref 6.5–8.1)

## 2020-07-29 LAB — URINE CULTURE

## 2020-07-31 ENCOUNTER — Telehealth: Payer: Self-pay | Admitting: *Deleted

## 2020-07-31 NOTE — Telephone Encounter (Signed)
Attempted to call pt to review remainder lab results.  No answer. Unable to leave vm, mailbox is full.

## 2020-07-31 NOTE — Telephone Encounter (Signed)
-----   Message from Alver Sorrow, NP sent at 07/27/2020  4:58 PM EDT ----- Glucose was critically high, she has been contacted already by Misty Stanley, RN and verbalized understanding to present to ED for further evaluation. She is not presently taking any diabetic medications with A1c 03/2020 of 14.6.  Remainder of lab results may be called Monday, detailed below:  Cholesterol is markedly elevated. Recommend start Rosuvastatin (Crestor) 20mg  daily. This is different than her previous Atorvastatin but generally better tolerated. Kidneys show mild decline which could be related to high blood glucose and/or dehydration. Normal potassium and liver function. Protein mildly decreased, recommend increase dietary intake of protein.

## 2020-08-09 NOTE — Telephone Encounter (Signed)
Pt has been notified of results see result note 08/01/20.

## 2020-09-04 ENCOUNTER — Inpatient Hospital Stay
Admission: EM | Admit: 2020-09-04 | Discharge: 2020-09-06 | DRG: 246 | Disposition: A | Discharge status: Home / Self Care | Payer: Medicare HMO | Attending: Internal Medicine | Admitting: Internal Medicine

## 2020-09-04 ENCOUNTER — Emergency Department: Payer: Medicare HMO

## 2020-09-04 ENCOUNTER — Other Ambulatory Visit: Payer: Self-pay

## 2020-09-04 DIAGNOSIS — Z7982 Long term (current) use of aspirin: Secondary | ICD-10-CM

## 2020-09-04 DIAGNOSIS — T82855A Stenosis of coronary artery stent, initial encounter: Secondary | ICD-10-CM | POA: Diagnosis not present

## 2020-09-04 DIAGNOSIS — G8929 Other chronic pain: Secondary | ICD-10-CM | POA: Diagnosis present

## 2020-09-04 DIAGNOSIS — K219 Gastro-esophageal reflux disease without esophagitis: Secondary | ICD-10-CM | POA: Diagnosis present

## 2020-09-04 DIAGNOSIS — Z87892 Personal history of anaphylaxis: Secondary | ICD-10-CM

## 2020-09-04 DIAGNOSIS — F1210 Cannabis abuse, uncomplicated: Secondary | ICD-10-CM | POA: Diagnosis present

## 2020-09-04 DIAGNOSIS — R739 Hyperglycemia, unspecified: Secondary | ICD-10-CM | POA: Diagnosis not present

## 2020-09-04 DIAGNOSIS — G4733 Obstructive sleep apnea (adult) (pediatric): Secondary | ICD-10-CM | POA: Diagnosis present

## 2020-09-04 DIAGNOSIS — Z91048 Other nonmedicinal substance allergy status: Secondary | ICD-10-CM

## 2020-09-04 DIAGNOSIS — F17210 Nicotine dependence, cigarettes, uncomplicated: Secondary | ICD-10-CM | POA: Diagnosis present

## 2020-09-04 DIAGNOSIS — Z885 Allergy status to narcotic agent status: Secondary | ICD-10-CM

## 2020-09-04 DIAGNOSIS — Z8711 Personal history of peptic ulcer disease: Secondary | ICD-10-CM

## 2020-09-04 DIAGNOSIS — Z9114 Patient's other noncompliance with medication regimen: Secondary | ICD-10-CM

## 2020-09-04 DIAGNOSIS — I25118 Atherosclerotic heart disease of native coronary artery with other forms of angina pectoris: Secondary | ICD-10-CM

## 2020-09-04 DIAGNOSIS — R569 Unspecified convulsions: Secondary | ICD-10-CM | POA: Diagnosis present

## 2020-09-04 DIAGNOSIS — Z79899 Other long term (current) drug therapy: Secondary | ICD-10-CM

## 2020-09-04 DIAGNOSIS — Z881 Allergy status to other antibiotic agents status: Secondary | ICD-10-CM

## 2020-09-04 DIAGNOSIS — F319 Bipolar disorder, unspecified: Secondary | ICD-10-CM | POA: Diagnosis present

## 2020-09-04 DIAGNOSIS — J449 Chronic obstructive pulmonary disease, unspecified: Secondary | ICD-10-CM | POA: Diagnosis present

## 2020-09-04 DIAGNOSIS — Z8701 Personal history of pneumonia (recurrent): Secondary | ICD-10-CM

## 2020-09-04 DIAGNOSIS — E1129 Type 2 diabetes mellitus with other diabetic kidney complication: Secondary | ICD-10-CM | POA: Diagnosis present

## 2020-09-04 DIAGNOSIS — I160 Hypertensive urgency: Secondary | ICD-10-CM | POA: Diagnosis present

## 2020-09-04 DIAGNOSIS — Z90710 Acquired absence of both cervix and uterus: Secondary | ICD-10-CM

## 2020-09-04 DIAGNOSIS — Z8673 Personal history of transient ischemic attack (TIA), and cerebral infarction without residual deficits: Secondary | ICD-10-CM

## 2020-09-04 DIAGNOSIS — I5032 Chronic diastolic (congestive) heart failure: Secondary | ICD-10-CM | POA: Diagnosis present

## 2020-09-04 DIAGNOSIS — Z9851 Tubal ligation status: Secondary | ICD-10-CM

## 2020-09-04 DIAGNOSIS — I2510 Atherosclerotic heart disease of native coronary artery without angina pectoris: Secondary | ICD-10-CM | POA: Diagnosis present

## 2020-09-04 DIAGNOSIS — Z7902 Long term (current) use of antithrombotics/antiplatelets: Secondary | ICD-10-CM

## 2020-09-04 DIAGNOSIS — I252 Old myocardial infarction: Secondary | ICD-10-CM

## 2020-09-04 DIAGNOSIS — Z955 Presence of coronary angioplasty implant and graft: Secondary | ICD-10-CM

## 2020-09-04 DIAGNOSIS — E785 Hyperlipidemia, unspecified: Secondary | ICD-10-CM | POA: Diagnosis present

## 2020-09-04 DIAGNOSIS — Z720 Tobacco use: Secondary | ICD-10-CM | POA: Diagnosis present

## 2020-09-04 DIAGNOSIS — Z20822 Contact with and (suspected) exposure to covid-19: Secondary | ICD-10-CM | POA: Diagnosis present

## 2020-09-04 DIAGNOSIS — M1990 Unspecified osteoarthritis, unspecified site: Secondary | ICD-10-CM | POA: Diagnosis present

## 2020-09-04 DIAGNOSIS — I130 Hypertensive heart and chronic kidney disease with heart failure and stage 1 through stage 4 chronic kidney disease, or unspecified chronic kidney disease: Secondary | ICD-10-CM | POA: Diagnosis present

## 2020-09-04 DIAGNOSIS — E1165 Type 2 diabetes mellitus with hyperglycemia: Secondary | ICD-10-CM | POA: Diagnosis present

## 2020-09-04 DIAGNOSIS — N182 Chronic kidney disease, stage 2 (mild): Secondary | ICD-10-CM | POA: Diagnosis present

## 2020-09-04 DIAGNOSIS — F419 Anxiety disorder, unspecified: Secondary | ICD-10-CM | POA: Diagnosis present

## 2020-09-04 DIAGNOSIS — I2119 ST elevation (STEMI) myocardial infarction involving other coronary artery of inferior wall: Secondary | ICD-10-CM | POA: Diagnosis present

## 2020-09-04 DIAGNOSIS — I213 ST elevation (STEMI) myocardial infarction of unspecified site: Secondary | ICD-10-CM | POA: Diagnosis present

## 2020-09-04 DIAGNOSIS — I255 Ischemic cardiomyopathy: Secondary | ICD-10-CM | POA: Diagnosis present

## 2020-09-04 DIAGNOSIS — E1122 Type 2 diabetes mellitus with diabetic chronic kidney disease: Secondary | ICD-10-CM | POA: Diagnosis present

## 2020-09-04 DIAGNOSIS — Z888 Allergy status to other drugs, medicaments and biological substances status: Secondary | ICD-10-CM

## 2020-09-04 DIAGNOSIS — D72829 Elevated white blood cell count, unspecified: Secondary | ICD-10-CM | POA: Diagnosis present

## 2020-09-04 DIAGNOSIS — T4276XA Underdosing of unspecified antiepileptic and sedative-hypnotic drugs, initial encounter: Secondary | ICD-10-CM | POA: Diagnosis present

## 2020-09-04 DIAGNOSIS — I21A9 Other myocardial infarction type: Secondary | ICD-10-CM | POA: Diagnosis present

## 2020-09-04 DIAGNOSIS — I10 Essential (primary) hypertension: Secondary | ICD-10-CM

## 2020-09-04 DIAGNOSIS — Z87442 Personal history of urinary calculi: Secondary | ICD-10-CM

## 2020-09-04 DIAGNOSIS — G2581 Restless legs syndrome: Secondary | ICD-10-CM | POA: Diagnosis present

## 2020-09-04 LAB — CBC
HCT: 47.5 % — ABNORMAL HIGH (ref 36.0–46.0)
Hemoglobin: 16 g/dL — ABNORMAL HIGH (ref 12.0–15.0)
MCH: 31.4 pg (ref 26.0–34.0)
MCHC: 33.7 g/dL (ref 30.0–36.0)
MCV: 93.1 fL (ref 80.0–100.0)
Platelets: 297 10*3/uL (ref 150–400)
RBC: 5.1 MIL/uL (ref 3.87–5.11)
RDW: 13.3 % (ref 11.5–15.5)
WBC: 17.5 10*3/uL — ABNORMAL HIGH (ref 4.0–10.5)
nRBC: 0 % (ref 0.0–0.2)

## 2020-09-04 NOTE — ED Notes (Signed)
Pt has chest pain that started tonight upon finding out her brother was deceased.  Pt took ntg without relief.  Pt now has a headache.  Pt has nausea on arrival to treatment room.  Pt reports pain in center of chest.  No sob.  cig smoker.  Pt alert   Speech clear.

## 2020-09-04 NOTE — ED Triage Notes (Signed)
Pt arrived via POV with reports of L side CP that started 1 hour PTA. Pt states she found her brother deceased tonight, and pain started not long after that. Pt states the pain is radiating up to her jaw and down the L arm.   Pt also c/o numbness. Pt states she took 2 NTG tabs, last one being right after registration into the ED.  Pt states she has been spitting up blood over the past several days as well and thinks she has walking pneumonia.

## 2020-09-05 ENCOUNTER — Encounter: Payer: Self-pay | Admitting: Internal Medicine

## 2020-09-05 ENCOUNTER — Encounter
Admission: EM | Disposition: A | Discharge status: Home / Self Care | Payer: Self-pay | Source: Home / Self Care | Attending: Internal Medicine

## 2020-09-05 DIAGNOSIS — I5032 Chronic diastolic (congestive) heart failure: Secondary | ICD-10-CM | POA: Diagnosis present

## 2020-09-05 DIAGNOSIS — N182 Chronic kidney disease, stage 2 (mild): Secondary | ICD-10-CM | POA: Diagnosis not present

## 2020-09-05 DIAGNOSIS — F17210 Nicotine dependence, cigarettes, uncomplicated: Secondary | ICD-10-CM | POA: Diagnosis present

## 2020-09-05 DIAGNOSIS — T82855A Stenosis of coronary artery stent, initial encounter: Secondary | ICD-10-CM | POA: Diagnosis present

## 2020-09-05 DIAGNOSIS — R739 Hyperglycemia, unspecified: Secondary | ICD-10-CM | POA: Diagnosis present

## 2020-09-05 DIAGNOSIS — D72829 Elevated white blood cell count, unspecified: Secondary | ICD-10-CM | POA: Diagnosis present

## 2020-09-05 DIAGNOSIS — R569 Unspecified convulsions: Secondary | ICD-10-CM

## 2020-09-05 DIAGNOSIS — K219 Gastro-esophageal reflux disease without esophagitis: Secondary | ICD-10-CM | POA: Diagnosis present

## 2020-09-05 DIAGNOSIS — Z20822 Contact with and (suspected) exposure to covid-19: Secondary | ICD-10-CM | POA: Diagnosis present

## 2020-09-05 DIAGNOSIS — F1910 Other psychoactive substance abuse, uncomplicated: Secondary | ICD-10-CM

## 2020-09-05 DIAGNOSIS — I2510 Atherosclerotic heart disease of native coronary artery without angina pectoris: Secondary | ICD-10-CM | POA: Diagnosis present

## 2020-09-05 DIAGNOSIS — E785 Hyperlipidemia, unspecified: Secondary | ICD-10-CM

## 2020-09-05 DIAGNOSIS — M1990 Unspecified osteoarthritis, unspecified site: Secondary | ICD-10-CM | POA: Diagnosis present

## 2020-09-05 DIAGNOSIS — F419 Anxiety disorder, unspecified: Secondary | ICD-10-CM | POA: Diagnosis present

## 2020-09-05 DIAGNOSIS — E1122 Type 2 diabetes mellitus with diabetic chronic kidney disease: Secondary | ICD-10-CM | POA: Diagnosis present

## 2020-09-05 DIAGNOSIS — I255 Ischemic cardiomyopathy: Secondary | ICD-10-CM

## 2020-09-05 DIAGNOSIS — J449 Chronic obstructive pulmonary disease, unspecified: Secondary | ICD-10-CM | POA: Diagnosis not present

## 2020-09-05 DIAGNOSIS — I2111 ST elevation (STEMI) myocardial infarction involving right coronary artery: Secondary | ICD-10-CM | POA: Diagnosis not present

## 2020-09-05 DIAGNOSIS — J432 Centrilobular emphysema: Secondary | ICD-10-CM | POA: Diagnosis not present

## 2020-09-05 DIAGNOSIS — I2119 ST elevation (STEMI) myocardial infarction involving other coronary artery of inferior wall: Secondary | ICD-10-CM | POA: Diagnosis present

## 2020-09-05 DIAGNOSIS — E1165 Type 2 diabetes mellitus with hyperglycemia: Secondary | ICD-10-CM | POA: Diagnosis present

## 2020-09-05 DIAGNOSIS — I10 Essential (primary) hypertension: Secondary | ICD-10-CM | POA: Diagnosis not present

## 2020-09-05 DIAGNOSIS — I160 Hypertensive urgency: Secondary | ICD-10-CM | POA: Diagnosis present

## 2020-09-05 DIAGNOSIS — I21A9 Other myocardial infarction type: Secondary | ICD-10-CM | POA: Diagnosis present

## 2020-09-05 DIAGNOSIS — G4733 Obstructive sleep apnea (adult) (pediatric): Secondary | ICD-10-CM | POA: Diagnosis present

## 2020-09-05 DIAGNOSIS — I213 ST elevation (STEMI) myocardial infarction of unspecified site: Secondary | ICD-10-CM | POA: Diagnosis present

## 2020-09-05 DIAGNOSIS — I25118 Atherosclerotic heart disease of native coronary artery with other forms of angina pectoris: Secondary | ICD-10-CM | POA: Diagnosis not present

## 2020-09-05 DIAGNOSIS — G8929 Other chronic pain: Secondary | ICD-10-CM | POA: Diagnosis present

## 2020-09-05 DIAGNOSIS — I130 Hypertensive heart and chronic kidney disease with heart failure and stage 1 through stage 4 chronic kidney disease, or unspecified chronic kidney disease: Secondary | ICD-10-CM | POA: Diagnosis present

## 2020-09-05 DIAGNOSIS — G2581 Restless legs syndrome: Secondary | ICD-10-CM | POA: Diagnosis present

## 2020-09-05 DIAGNOSIS — F1210 Cannabis abuse, uncomplicated: Secondary | ICD-10-CM | POA: Diagnosis present

## 2020-09-05 DIAGNOSIS — E1129 Type 2 diabetes mellitus with other diabetic kidney complication: Secondary | ICD-10-CM | POA: Diagnosis present

## 2020-09-05 DIAGNOSIS — F319 Bipolar disorder, unspecified: Secondary | ICD-10-CM | POA: Diagnosis present

## 2020-09-05 HISTORY — PX: LEFT HEART CATH AND CORONARY ANGIOGRAPHY: CATH118249

## 2020-09-05 HISTORY — PX: CORONARY/GRAFT ACUTE MI REVASCULARIZATION: CATH118305

## 2020-09-05 LAB — COMPREHENSIVE METABOLIC PANEL
ALT: 18 U/L (ref 0–44)
AST: 13 U/L — ABNORMAL LOW (ref 15–41)
Albumin: 4.9 g/dL (ref 3.5–5.0)
Alkaline Phosphatase: 113 U/L (ref 38–126)
Anion gap: 14 (ref 5–15)
BUN: 20 mg/dL (ref 6–20)
CO2: 20 mmol/L — ABNORMAL LOW (ref 22–32)
Calcium: 9.6 mg/dL (ref 8.9–10.3)
Chloride: 98 mmol/L (ref 98–111)
Creatinine, Ser: 1.03 mg/dL — ABNORMAL HIGH (ref 0.44–1.00)
GFR, Estimated: >60 mL/min (ref >60)
Glucose, Bld: 588 mg/dL (ref 70–99)
Potassium: 4 mmol/L (ref 3.5–5.1)
Sodium: 132 mmol/L — ABNORMAL LOW (ref 135–145)
Total Bilirubin: 1.1 mg/dL (ref 0.3–1.2)
Total Protein: 7.9 g/dL (ref 6.5–8.1)

## 2020-09-05 LAB — CBC
HCT: 45.4 % (ref 36.0–46.0)
Hemoglobin: 15.6 g/dL — ABNORMAL HIGH (ref 12.0–15.0)
MCH: 31.3 pg (ref 26.0–34.0)
MCHC: 34.4 g/dL (ref 30.0–36.0)
MCV: 91 fL (ref 80.0–100.0)
Platelets: 294 10*3/uL (ref 150–400)
RBC: 4.99 MIL/uL (ref 3.87–5.11)
RDW: 13.4 % (ref 11.5–15.5)
WBC: 14.2 10*3/uL — ABNORMAL HIGH (ref 4.0–10.5)
nRBC: 0 % (ref 0.0–0.2)

## 2020-09-05 LAB — RESP PANEL BY RT-PCR (FLU A&B, COVID) ARPGX2
Influenza A by PCR: NEGATIVE
Influenza B by PCR: NEGATIVE
SARS Coronavirus 2 by RT PCR: NEGATIVE

## 2020-09-05 LAB — GLUCOSE, CAPILLARY
Glucose-Capillary: 517 mg/dL (ref 70–99)
Glucose-Capillary: 475 mg/dL — ABNORMAL HIGH (ref 70–99)
Glucose-Capillary: 414 mg/dL — ABNORMAL HIGH (ref 70–99)
Glucose-Capillary: 267 mg/dL — ABNORMAL HIGH (ref 70–99)
Glucose-Capillary: 189 mg/dL — ABNORMAL HIGH (ref 70–99)
Glucose-Capillary: 441 mg/dL — ABNORMAL HIGH (ref 70–99)
Glucose-Capillary: 466 mg/dL — ABNORMAL HIGH (ref 70–99)

## 2020-09-05 LAB — PROTIME-INR
INR: 0.9 (ref 0.8–1.2)
Prothrombin Time: 12.5 seconds (ref 11.4–15.2)

## 2020-09-05 LAB — BASIC METABOLIC PANEL
Anion gap: 10 (ref 5–15)
BUN: 23 mg/dL — ABNORMAL HIGH (ref 6–20)
CO2: 20 mmol/L — ABNORMAL LOW (ref 22–32)
Calcium: 9.3 mg/dL (ref 8.9–10.3)
Chloride: 104 mmol/L (ref 98–111)
Creatinine, Ser: 0.74 mg/dL (ref 0.44–1.00)
GFR, Estimated: >60 mL/min (ref >60)
Glucose, Bld: 200 mg/dL — ABNORMAL HIGH (ref 70–99)
Potassium: 3.4 mmol/L — ABNORMAL LOW (ref 3.5–5.1)
Sodium: 134 mmol/L — ABNORMAL LOW (ref 135–145)

## 2020-09-05 LAB — TROPONIN I (HIGH SENSITIVITY)
Troponin I (High Sensitivity): 27 ng/L — ABNORMAL HIGH (ref <18)
Troponin I (High Sensitivity): 154 ng/L (ref <18)
Troponin I (High Sensitivity): >27000 ng/L (ref <18)
Troponin I (High Sensitivity): >27000 ng/L (ref <18)

## 2020-09-05 LAB — HEMOGLOBIN A1C
Hgb A1c MFr Bld: 14.3 % — ABNORMAL HIGH (ref 4.8–5.6)
Mean Plasma Glucose: 363.71 mg/dL

## 2020-09-05 LAB — LIPID PANEL
Cholesterol: 304 mg/dL — ABNORMAL HIGH (ref 0–200)
HDL: 55 mg/dL (ref >40)
LDL Cholesterol: 227 mg/dL — ABNORMAL HIGH (ref 0–99)
Total CHOL/HDL Ratio: 5.5 RATIO
Triglycerides: 108 mg/dL (ref <150)
VLDL: 22 mg/dL (ref 0–40)

## 2020-09-05 LAB — POCT ACTIVATED CLOTTING TIME
Activated Clotting Time: 321 seconds
Activated Clotting Time: 368 seconds

## 2020-09-05 LAB — APTT: aPTT: 25 seconds (ref 24–36)

## 2020-09-05 LAB — MRSA PCR SCREENING: MRSA by PCR: NEGATIVE

## 2020-09-05 LAB — LIPASE, BLOOD: Lipase: 36 U/L (ref 11–51)

## 2020-09-05 SURGERY — CORONARY/GRAFT ACUTE MI REVASCULARIZATION
Anesthesia: Moderate Sedation

## 2020-09-05 MED ORDER — LIDOCAINE HCL (PF) 1 % IJ SOLN
INTRAMUSCULAR | Status: AC
  Filled 2020-09-05: qty 30

## 2020-09-05 MED ORDER — SODIUM CHLORIDE 0.9% FLUSH
3.0000 mL | Freq: Two times a day (BID) | INTRAVENOUS | Status: DC
  Administered 2020-09-05 (×2): 3 mL via INTRAVENOUS

## 2020-09-05 MED ORDER — SODIUM CHLORIDE 0.9% FLUSH: 3.0000 mL | INTRAVENOUS | Status: DC | PRN

## 2020-09-05 MED ORDER — VERAPAMIL HCL 2.5 MG/ML IV SOLN
INTRAVENOUS | Status: AC
  Filled 2020-09-05: qty 2

## 2020-09-05 MED ORDER — HEPARIN (PORCINE) IN NACL 1000-0.9 UT/500ML-% IV SOLN
INTRAVENOUS | Status: AC
  Filled 2020-09-05: qty 1000

## 2020-09-05 MED ORDER — VERAPAMIL HCL 2.5 MG/ML IV SOLN
INTRAVENOUS | Status: DC | PRN
  Administered 2020-09-05: 2.5 mg via INTRAVENOUS

## 2020-09-05 MED ORDER — TIROFIBAN HCL IN NACL 5-0.9 MG/100ML-% IV SOLN
INTRAVENOUS | Status: AC
  Filled 2020-09-05: qty 100

## 2020-09-05 MED ORDER — LISINOPRIL 20 MG PO TABS
20.0000 mg | ORAL_TABLET | Freq: Every day | ORAL | Status: DC
  Administered 2020-09-05: 20 mg via ORAL
  Filled 2020-09-05 (×2): qty 1

## 2020-09-05 MED ORDER — HEPARIN (PORCINE) IN NACL 1000-0.9 UT/500ML-% IV SOLN
INTRAVENOUS | Status: DC | PRN
  Administered 2020-09-05 (×2): 500 mL

## 2020-09-05 MED ORDER — INSULIN ASPART 100 UNIT/ML IJ SOLN: 0.0000 [IU] | Freq: Every day | INTRAMUSCULAR | Status: DC

## 2020-09-05 MED ORDER — FUROSEMIDE 10 MG/ML IJ SOLN
INTRAMUSCULAR | Status: AC
  Filled 2020-09-05: qty 4

## 2020-09-05 MED ORDER — NITROGLYCERIN 1 MG/10 ML FOR IR/CATH LAB
INTRA_ARTERIAL | Status: AC
  Filled 2020-09-05: qty 10

## 2020-09-05 MED ORDER — GABAPENTIN 300 MG PO CAPS
600.0000 mg | ORAL_CAPSULE | Freq: Every day | ORAL | Status: DC
  Administered 2020-09-05 – 2020-09-06 (×2): 600 mg via ORAL
  Filled 2020-09-05 (×2): qty 2

## 2020-09-05 MED ORDER — ATORVASTATIN CALCIUM 20 MG PO TABS
80.0000 mg | ORAL_TABLET | Freq: Every day | ORAL | Status: DC
  Administered 2020-09-05 – 2020-09-06 (×2): 80 mg via ORAL
  Filled 2020-09-05 (×2): qty 4

## 2020-09-05 MED ORDER — LABETALOL HCL 5 MG/ML IV SOLN: 10.0000 mg | INTRAVENOUS | Status: AC | PRN

## 2020-09-05 MED ORDER — INSULIN ASPART 100 UNIT/ML IJ SOLN
0.0000 [IU] | Freq: Three times a day (TID) | INTRAMUSCULAR | Status: DC
Start: 2020-09-06 — End: 2020-09-05

## 2020-09-05 MED ORDER — INSULIN ASPART 100 UNIT/ML IJ SOLN
0.0000 [IU] | Freq: Three times a day (TID) | INTRAMUSCULAR | Status: DC
  Administered 2020-09-05: 8 [IU] via SUBCUTANEOUS
  Administered 2020-09-05: 3 [IU] via SUBCUTANEOUS
  Filled 2020-09-05 (×2): qty 1

## 2020-09-05 MED ORDER — LORAZEPAM 2 MG/ML IJ SOLN
1.0000 mg | Freq: Once | INTRAMUSCULAR | Status: AC
  Administered 2020-09-05: 1 mg via INTRAVENOUS
  Filled 2020-09-05: qty 1

## 2020-09-05 MED ORDER — DROPERIDOL 2.5 MG/ML IJ SOLN
1.2500 mg | Freq: Once | INTRAMUSCULAR | Status: AC
  Administered 2020-09-05: 1.25 mg via INTRAVENOUS
  Filled 2020-09-05: qty 2

## 2020-09-05 MED ORDER — NITROGLYCERIN 1 MG/10 ML FOR IR/CATH LAB
INTRA_ARTERIAL | Status: DC | PRN
  Administered 2020-09-05 (×2): 200 ug

## 2020-09-05 MED ORDER — INSULIN ASPART 100 UNIT/ML IJ SOLN
0.0000 [IU] | Freq: Three times a day (TID) | INTRAMUSCULAR | Status: DC
  Administered 2020-09-05: 20 [IU] via SUBCUTANEOUS
  Administered 2020-09-06: 11 [IU] via SUBCUTANEOUS
  Administered 2020-09-06: 20 [IU] via SUBCUTANEOUS
  Filled 2020-09-05 (×3): qty 1

## 2020-09-05 MED ORDER — TIROFIBAN HCL IV 12.5 MG/250 ML
0.0750 ug/kg/min | INTRAVENOUS | Status: AC
  Administered 2020-09-05: 0.075 ug/kg/min via INTRAVENOUS
  Filled 2020-09-05: qty 250

## 2020-09-05 MED ORDER — LIDOCAINE HCL (PF) 1 % IJ SOLN
INTRAMUSCULAR | Status: DC | PRN
  Administered 2020-09-05: 2 mL

## 2020-09-05 MED ORDER — RANOLAZINE ER 500 MG PO TB12
500.0000 mg | ORAL_TABLET | Freq: Two times a day (BID) | ORAL | Status: DC
  Administered 2020-09-05 – 2020-09-06 (×3): 500 mg via ORAL
  Filled 2020-09-05 (×4): qty 1

## 2020-09-05 MED ORDER — INSULIN GLARGINE 100 UNIT/ML ~~LOC~~ SOLN
10.0000 [IU] | Freq: Every day | SUBCUTANEOUS | Status: DC
  Administered 2020-09-05: 10 [IU] via SUBCUTANEOUS
  Filled 2020-09-05 (×2): qty 0.1

## 2020-09-05 MED ORDER — NITROGLYCERIN 0.4 MG SL SUBL: 0.4000 mg | SUBLINGUAL_TABLET | SUBLINGUAL | Status: DC | PRN

## 2020-09-05 MED ORDER — TICAGRELOR 90 MG PO TABS
ORAL_TABLET | ORAL | Status: AC
  Filled 2020-09-05: qty 2

## 2020-09-05 MED ORDER — NITROGLYCERIN 2 % TD OINT
1.0000 [in_us] | TOPICAL_OINTMENT | Freq: Once | TRANSDERMAL | Status: AC
  Administered 2020-09-05: 1 [in_us] via TOPICAL

## 2020-09-05 MED ORDER — CEPHALEXIN 500 MG PO CAPS
500.0000 mg | ORAL_CAPSULE | Freq: Three times a day (TID) | ORAL | Status: DC
  Filled 2020-09-05: qty 1

## 2020-09-05 MED ORDER — INSULIN ASPART 100 UNIT/ML IJ SOLN
5.0000 [IU] | Freq: Three times a day (TID) | INTRAMUSCULAR | Status: DC
  Administered 2020-09-05: 5 [IU] via SUBCUTANEOUS
  Filled 2020-09-05: qty 1

## 2020-09-05 MED ORDER — SODIUM CHLORIDE 0.9 % IV SOLN: 250.0000 mL | INTRAVENOUS | Status: DC | PRN

## 2020-09-05 MED ORDER — HEPARIN BOLUS VIA INFUSION
4000.0000 [IU] | Freq: Once | INTRAVENOUS | Status: AC
  Administered 2020-09-05: 4000 [IU] via INTRAVENOUS
  Filled 2020-09-05: qty 4000

## 2020-09-05 MED ORDER — INSULIN GLARGINE 100 UNIT/ML ~~LOC~~ SOLN
10.0000 [IU] | Freq: Every day | SUBCUTANEOUS | Status: DC
  Filled 2020-09-05: qty 0.1

## 2020-09-05 MED ORDER — FENTANYL CITRATE (PF) 100 MCG/2ML IJ SOLN
INTRAMUSCULAR | Status: DC | PRN
  Administered 2020-09-05: 25 ug via INTRAVENOUS

## 2020-09-05 MED ORDER — ADENOSINE 6 MG/2ML IV SOLN
INTRAVENOUS | Status: AC
  Filled 2020-09-05: qty 2

## 2020-09-05 MED ORDER — ALPRAZOLAM 0.5 MG PO TABS
0.5000 mg | ORAL_TABLET | Freq: Once | ORAL | Status: AC
  Administered 2020-09-05: 0.5 mg via ORAL
  Filled 2020-09-05: qty 1

## 2020-09-05 MED ORDER — HEPARIN SODIUM (PORCINE) 1000 UNIT/ML IJ SOLN
INTRAMUSCULAR | Status: AC
  Filled 2020-09-05: qty 1

## 2020-09-05 MED ORDER — METOPROLOL SUCCINATE ER 50 MG PO TB24
50.0000 mg | ORAL_TABLET | Freq: Two times a day (BID) | ORAL | Status: DC
  Administered 2020-09-05 – 2020-09-06 (×3): 50 mg via ORAL
  Filled 2020-09-05 (×3): qty 1

## 2020-09-05 MED ORDER — TICAGRELOR 90 MG PO TABS
90.0000 mg | ORAL_TABLET | Freq: Two times a day (BID) | ORAL | Status: DC
  Administered 2020-09-05 – 2020-09-06 (×2): 90 mg via ORAL
  Filled 2020-09-05 (×2): qty 1

## 2020-09-05 MED ORDER — OXYCODONE-ACETAMINOPHEN 5-325 MG PO TABS
2.0000 | ORAL_TABLET | Freq: Once | ORAL | Status: AC
Start: 2020-09-05 — End: 2020-09-05
  Administered 2020-09-05: 2 via ORAL
  Filled 2020-09-05: qty 2

## 2020-09-05 MED ORDER — TICAGRELOR 90 MG PO TABS
ORAL_TABLET | ORAL | Status: DC | PRN
  Administered 2020-09-05: 180 mg via ORAL

## 2020-09-05 MED ORDER — LORAZEPAM 2 MG/ML IJ SOLN: 1.0000 mg | INTRAMUSCULAR | Status: DC | PRN

## 2020-09-05 MED ORDER — TIROFIBAN HCL IV 12.5 MG/250 ML
INTRAVENOUS | Status: AC | PRN
  Administered 2020-09-05: 0.075 ug/kg/min via INTRAVENOUS

## 2020-09-05 MED ORDER — FUROSEMIDE 10 MG/ML IJ SOLN
INTRAMUSCULAR | Status: DC | PRN
  Administered 2020-09-05: 20 mg via INTRAVENOUS

## 2020-09-05 MED ORDER — NICOTINE 21 MG/24HR TD PT24
21.0000 mg | MEDICATED_PATCH | Freq: Every day | TRANSDERMAL | Status: DC
  Filled 2020-09-05 (×2): qty 1

## 2020-09-05 MED ORDER — FENTANYL CITRATE (PF) 100 MCG/2ML IJ SOLN
INTRAMUSCULAR | Status: AC
  Filled 2020-09-05: qty 2

## 2020-09-05 MED ORDER — ALPRAZOLAM 0.5 MG PO TABS
0.5000 mg | ORAL_TABLET | Freq: Three times a day (TID) | ORAL | Status: DC | PRN
  Administered 2020-09-05: 0.5 mg via ORAL
  Filled 2020-09-05: qty 1

## 2020-09-05 MED ORDER — HYDROCODONE-ACETAMINOPHEN 5-325 MG PO TABS
1.0000 | ORAL_TABLET | ORAL | Status: DC | PRN
  Administered 2020-09-05 – 2020-09-06 (×2): 1 via ORAL
  Filled 2020-09-05 (×2): qty 1

## 2020-09-05 MED ORDER — ACETAMINOPHEN 325 MG PO TABS: 650.0000 mg | ORAL_TABLET | ORAL | Status: DC | PRN

## 2020-09-05 MED ORDER — ONDANSETRON 4 MG PO TBDP
4.0000 mg | ORAL_TABLET | Freq: Once | ORAL | Status: AC
  Administered 2020-09-05: 4 mg via ORAL
  Filled 2020-09-05: qty 1

## 2020-09-05 MED ORDER — ADENOSINE (DIAGNOSTIC) FOR INTRACORONARY USE
INTRAVENOUS | Status: DC | PRN
  Administered 2020-09-05 (×4): 48 ug via INTRACORONARY

## 2020-09-05 MED ORDER — ALBUTEROL SULFATE HFA 108 (90 BASE) MCG/ACT IN AERS
2.0000 | INHALATION_SPRAY | RESPIRATORY_TRACT | Status: DC | PRN
  Filled 2020-09-05: qty 6.7

## 2020-09-05 MED ORDER — ALPRAZOLAM 0.25 MG PO TABS
0.5000 mg | ORAL_TABLET | Freq: Four times a day (QID) | ORAL | Status: DC | PRN
  Administered 2020-09-05: 0.5 mg via ORAL
  Filled 2020-09-05: qty 2

## 2020-09-05 MED ORDER — HEPARIN (PORCINE) 25000 UT/250ML-% IV SOLN: 850.0000 [IU]/h | INTRAVENOUS | Status: DC

## 2020-09-05 MED ORDER — LABETALOL HCL 5 MG/ML IV SOLN
20.0000 mg | Freq: Once | INTRAVENOUS | Status: AC
  Administered 2020-09-05: 20 mg via INTRAVENOUS
  Filled 2020-09-05: qty 4

## 2020-09-05 MED ORDER — ASPIRIN 81 MG PO CHEW
324.0000 mg | CHEWABLE_TABLET | Freq: Once | ORAL | Status: AC
  Administered 2020-09-05: 324 mg via ORAL

## 2020-09-05 MED ORDER — HEPARIN SODIUM (PORCINE) 1000 UNIT/ML IJ SOLN
INTRAMUSCULAR | Status: DC | PRN
  Administered 2020-09-05: 4000 [IU] via INTRAVENOUS
  Administered 2020-09-05: 3000 [IU] via INTRAVENOUS

## 2020-09-05 MED ORDER — TIROFIBAN (AGGRASTAT) BOLUS VIA INFUSION
INTRAVENOUS | Status: DC | PRN
  Administered 2020-09-05: 1825 ug via INTRAVENOUS

## 2020-09-05 MED ORDER — INSULIN ASPART 100 UNIT/ML IJ SOLN
0.0000 [IU] | Freq: Three times a day (TID) | INTRAMUSCULAR | Status: DC
  Administered 2020-09-05: 15 [IU] via SUBCUTANEOUS
  Filled 2020-09-05: qty 1

## 2020-09-05 MED ORDER — LABETALOL HCL 5 MG/ML IV SOLN
10.0000 mg | Freq: Once | INTRAVENOUS | Status: AC
  Administered 2020-09-05: 10 mg via INTRAVENOUS
  Filled 2020-09-05: qty 4

## 2020-09-05 MED ORDER — HYDRALAZINE HCL 20 MG/ML IJ SOLN: 10.0000 mg | INTRAMUSCULAR | Status: AC | PRN

## 2020-09-05 MED ORDER — IOHEXOL 300 MG/ML  SOLN
INTRAMUSCULAR | Status: DC | PRN
  Administered 2020-09-05: 88 mL via INTRA_ARTERIAL

## 2020-09-05 MED ORDER — ASPIRIN EC 81 MG PO TBEC
81.0000 mg | DELAYED_RELEASE_TABLET | Freq: Every day | ORAL | Status: DC
  Administered 2020-09-06: 81 mg via ORAL
  Filled 2020-09-05: qty 1

## 2020-09-05 MED ORDER — INSULIN ASPART 100 UNIT/ML IJ SOLN
INTRAMUSCULAR | Status: AC
  Administered 2020-09-05: 15 [IU] via SUBCUTANEOUS
  Filled 2020-09-05: qty 1

## 2020-09-05 MED ORDER — ENOXAPARIN SODIUM 40 MG/0.4ML IJ SOSY
40.0000 mg | PREFILLED_SYRINGE | INTRAMUSCULAR | Status: DC
  Administered 2020-09-06: 40 mg via SUBCUTANEOUS
  Filled 2020-09-05: qty 0.4

## 2020-09-05 MED ORDER — CHLORHEXIDINE GLUCONATE CLOTH 2 % EX PADS
6.0000 | MEDICATED_PAD | Freq: Every day | CUTANEOUS | Status: DC
  Administered 2020-09-05 – 2020-09-06 (×2): 6 via TOPICAL

## 2020-09-05 MED ORDER — ONDANSETRON HCL 4 MG/2ML IJ SOLN
4.0000 mg | Freq: Four times a day (QID) | INTRAMUSCULAR | Status: DC | PRN
  Administered 2020-09-05: 4 mg via INTRAVENOUS
  Filled 2020-09-05: qty 2

## 2020-09-05 MED ORDER — LACTATED RINGERS IV BOLUS
1000.0000 mL | Freq: Once | INTRAVENOUS | Status: AC
  Administered 2020-09-05: 1000 mL via INTRAVENOUS

## 2020-09-05 SURGICAL SUPPLY — 20 items
BALLN TREK RX 2.5X12 (BALLOONS) ×2
BALLN ~~LOC~~ EUPHORA RX 3.25X15 (BALLOONS) ×2
BALLOON TREK RX 2.5X12 (BALLOONS) ×1 IMPLANT
BALLOON ~~LOC~~ EUPHORA RX 3.25X15 (BALLOONS) ×1 IMPLANT
CATH 5F 110X4 TIG (CATHETERS) ×2 IMPLANT
CATH INFINITI 5FR ANG PIGTAIL (CATHETERS) ×2 IMPLANT
CATH LAUNCHER 6FR JR4 (CATHETERS) ×2 IMPLANT
DEVICE RAD TR BAND REGULAR (VASCULAR PRODUCTS) ×2 IMPLANT
GLIDESHEATH SLEND SS 6F .021 (SHEATH) ×2 IMPLANT
GUIDEWIRE INQWIRE 1.5J.035X260 (WIRE) ×1 IMPLANT
INQWIRE 1.5J .035X260CM (WIRE) ×2
KIT ENCORE 26 ADVANTAGE (KITS) ×2 IMPLANT
KIT SYRINGE INJ CVI SPIKEX1 (MISCELLANEOUS) ×2 IMPLANT
PACK CARDIAC CATH (CUSTOM PROCEDURE TRAY) ×2 IMPLANT
PROTECTION STATION PRESSURIZED (MISCELLANEOUS) ×2
SET ATX SIMPLICITY (MISCELLANEOUS) ×2 IMPLANT
STATION PROTECTION PRESSURIZED (MISCELLANEOUS) ×1 IMPLANT
STENT RESOLUTE ONYX 3.0X15 (Permanent Stent) ×2 IMPLANT
STENT RESOLUTE ONYX 3.0X18 (Permanent Stent) ×2 IMPLANT
WIRE RUNTHROUGH .014X180CM (WIRE) ×2 IMPLANT

## 2020-09-05 NOTE — Progress Notes (Signed)
ANTICOAGULATION CONSULT NOTE   Pharmacy Consult for heparin infusion Indication: chest pain/ACS/STEMI  Allergies  Allergen Reactions  . Buprenorphine Hcl     Nausea and vomiting  . Carbamazepine Anaphylaxis and Other (See Comments)    Blood dyscrasia when on with Lithium  . Lithium Other (See Comments)    Blood dyscrasia  . Lodine [Etodolac] Shortness Of Breath, Diarrhea, Nausea And Vomiting and Rash  . Methocarbamol Other (See Comments) and Shortness Of Breath    Unknown Rash, tachypnea  . Codeine Other (See Comments)    Upset stomach  . Levofloxacin In D5w Swelling and Other (See Comments)    "Salty" sensation in mouth. "Hard time to explain it"  . Tape Other (See Comments)    Welps, blister on skin  . Wound Dressing Adhesive   . Relafen [Nabumetone] Diarrhea, Nausea And Vomiting and Rash    Patient Measurements: Height: 5\' 2"  (157.5 cm) Weight: 73 kg (161 lb) IBW/kg (Calculated) : 50.1 Heparin Dosing Weight: 65.7 kg  Vital Signs: Temp: 97.9 F (36.6 C) (05/17 2332) Temp Source: Oral (05/17 2332) BP: 191/122 (05/18 0245) Pulse Rate: 86 (05/18 0245)  Labs: Recent Labs    09/04/20 2344 09/05/20 0156  HGB 16.0*  --   HCT 47.5*  --   PLT 297  --   CREATININE 1.03*  --   TROPONINIHS 27* 154*    Estimated Creatinine Clearance: 56.4 mL/min (A) (by C-G formula based on SCr of 1.03 mg/dL (H)).   Medical History: Past Medical History:  Diagnosis Date  . Anxiety   . Arthritis    "qwhere" (06/14/2014)  . Bipolar disorder ([facility])   . CAD (coronary artery disease)    a. 2008 PCI/DES to RCA; b. 2012 PCI: 95% mRCA s/p PCI/DES; b. 2/16 PCI DES-> 90% dRCA; c. 11/16 PCI: dRCA ISR s/p PCI/DES;  d. 4/18 PCI: p-mRCA 95% s/p PCI/DES; e. 02/2017 Cath: LML nl, LAD nl, D1 40, LCX min irregs, OM1 60, RCA 50-60p/m ISR, 47m/d, patent dRCA stent, RPDA 90ost (fills via L->R collats), EF 55-65%.  . Chronic bronchitis ([facility])    "get it q yr"  . Daily headache    "when my blood  pressure is up" (06/14/2014)  . Depression   . Diastolic dysfunction    a. 02/2017 Echo: EF 55-60%, no rwma, Gr1 DD, nl RV fxn.  03/2017 GERD (gastroesophageal reflux disease)   . Heart murmur   . High cholesterol   . History of stomach ulcers   . Hypertension   . Insomnia   . Kidney stones   . Migraine    "haven't had them in a good while; did get them 1-2 times/yr" (06/14/2014)  . Myocardial infarction ([facility]) 2008; ~ 2012  . Pneumonia    "get it 1-2 times/year" (06/14/2014)  . RLS (restless legs syndrome)   . Seizures ([facility])   . Sleep apnea    "they want me to do the lab but I haven't" (06/14/2014)  . Stroke [facility])    "they say I've had several" (06/14/2014)  . Tobacco abuse   . Type II diabetes mellitus ([facility])   . Urinary, incontinence, stress female     Assessment:  Pt is a 60 y.o. female with med hx of CAD w/ recent cardiac cath ~5 months ago during NSTEMI.  Pt  presents to ED for acute onset of radiating CP, headache, & N/V.    Goal of Therapy:  Heparin level 0.3-0.7 units/ml Monitor platelets by anticoagulation protocol: Yes  Plan:  Ordered baseline labs. Give 4000 units bolus x 1 Start heparin infusion at 850 units/hr Check anti-Xa level in 6 hours and daily while on heparin Continue to monitor H&H and platelets   Prior to completion of note, pt given bolus, then transferred to Cath Lab for cardiac cath procedure.  Heparin orders DCd, but heparin consult still active.  Pharmacy will follow up post procedure regarding treatment and will dc consult if warranted.  Otelia Sergeant, PharmD, [facility] 09/05/2020 3:13 AM

## 2020-09-05 NOTE — Consult Note (Signed)
Cardiology Consultation:   Patient ID: KWEEN BACORN MRN: [id number]; DOB: 1960/10/28  Admit date: 09/04/2020 Date of Consult: 09/05/2020  PCP:  Mateo Flow, MD   [facility] Providers Cardiologist:  [name], MD    Patient Profile:   Jennifer [name] is a 60 y.o. female with a hx of coronary artery disease status post multiple PCI's to the RCA, chronic HFpEF, hypertension, hyperlipidemia, type 2 diabetes mellitus, PAD, obstructive sleep apnea, polysubstance abuse (tobacco and marijuana), recurrent nephrolithiasis, chronic pain, depression, and anxiety who is being seen 09/05/2020 for the evaluation of chest pain and abnormal EKG at the request of Dr. Karma Greaser.  History of Present Illness:   Ms. Donahoo was in her usual state of health until yesterday evening (09/04/2020) around 8 PM when she was notified about the unexpected death of her brother.  About 30 minutes later, she began to experience worsening central chest pain/pressure accompanied by shortness of breath and nausea.  The pain radiated to her neck, left arm, and upper back and was reminiscent of what she has felt in the past with her MIs.  She took sublingual nitroglycerin with mild improvement and was subsequently brought to the [facility] emergency department by her family.  On arrival, she was noted to be severely hypertensive with blood pressures up to 206/116.  She received IV labetalol with some improvement in blood pressure.  She is also noted to be markedly hyperglycemic with blood sugar greater than 500.  Initial EKG showed subtle inferior and anterolateral ST elevations that were not felt consistent with STEMI.  Initial high-sensitivity troponin I was slightly elevated at 27, climbing to 154 2 hours later.  Due to elevated troponin and continued chest pain, I was contacted for consultation and recommended repeat EKG.  This showed more localized ST elevation in the inferolateral leads meeting STEMI criteria.  Patient was  then referred for emergent cardiac catheterization and PCI, given EKG findings and ongoing 8/10 chest pain.  Ms. Birky notes that she has been in the process of moving trailers and has not had access to her medications, including aspirin and clopidogrel, for at least 3 days.  Catheterization showed nonobstructive left coronary artery disease and thrombotic occlusion of the distal RCA just proximal to the old distal RCA stent.  Significant thrombus was evident with extension into the old distal RCA stent as well as embolization into RPL branches.  Patient underwent successful PCI with placement of 2 overlapping drug-eluting stents in the mid/distal RCA such that most of the RCA is now stented (2 layers of stent in the proximal and distal vessel).  At this time, she reports only minimal chest discomfort.   Past Medical History:  Diagnosis Date  . Anxiety   . Arthritis    "qwhere" (06/14/2014)  . Bipolar disorder ([place])   . CAD (coronary artery disease)    a. 2008 PCI/DES to RCA; b. 2012 PCI: 95% mRCA s/p PCI/DES; b. 2/16 PCI DES-> 90% dRCA; c. 11/16 PCI: dRCA ISR s/p PCI/DES;  d. 4/18 PCI: p-mRCA 95% s/p PCI/DES; e. 02/2017 Cath: LML nl, LAD nl, D1 40, LCX min irregs, OM1 60, RCA 50-60p/m ISR, 77md, patent dRCA stent, RPDA 90ost (fills via L->R collats), EF 55-65%.  . Chronic bronchitis ([place]    "get it q yr"  . Daily headache    "when my blood pressure is up" (06/14/2014)  . Depression   . Diastolic dysfunction    a. 02/2017 Echo: EF 55-60%, no rwma, Gr1 DD,  nl RV fxn.  [name] GERD (gastroesophageal reflux disease)   . Heart murmur   . High cholesterol   . History of stomach ulcers   . Hypertension   . Insomnia   . Kidney stones   . Migraine    "haven't had them in a good while; did get them 1-2 times/yr" (06/14/2014)  . Myocardial infarction ([place]) 2008; ~ 2012  . Pneumonia    "get it 1-2 times/year" (06/14/2014)  . RLS (restless legs syndrome)   . Seizures ([place])   . Sleep apnea    "they  want me to do the lab but I haven't" (06/14/2014)  . Stroke [facility])    "they say I've had several" (06/14/2014)  . Tobacco abuse   . Type II diabetes mellitus ([place])   . Urinary, incontinence, stress female     Past Surgical History:  Procedure Laterality Date  . ABDOMINAL HYSTERECTOMY  1984   "I've got 1 ovary left"  . CARDIAC CATHETERIZATION N/A 02/26/2015   Procedure: Left Heart Cath and Coronary Angiography;  Surgeon: Wellington Hampshire, MD;  Location: [place] [facility];  Service: Cardiovascular;  Laterality: N/A;  . CARDIAC CATHETERIZATION N/A 02/26/2015   Procedure: Coronary Stent Intervention;  Surgeon: Wellington Hampshire, MD;  Location: [place] [facility];  Service: Cardiovascular;  Laterality: N/A;  . CESAREAN SECTION  1984  . CORONARY ANGIOPLASTY WITH STENT PLACEMENT  2008; ~ 2012   "1; 1"  . CORONARY BALLOON ANGIOPLASTY N/A 06/09/2017   Procedure: CORONARY BALLOON ANGIOPLASTY;  Surgeon: Nelva Bush, MD;  Location: [place] [facility];  Service: Cardiovascular;  Laterality: N/A;  . CORONARY STENT INTERVENTION N/A 08/04/2016   Procedure: Coronary Stent Intervention;  Surgeon: Wellington Hampshire, MD;  Location: [place] [facility];  Service: Cardiovascular;  Laterality: N/A;  . DILATION AND CURETTAGE OF UTERUS    . EXTRACORPOREAL SHOCK WAVE LITHOTRIPSY  X 2  . I & D EXTREMITY Left 02/09/2018   Procedure: IRRIGATION AND DEBRIDEMENT EXTREMITY;  Surgeon: Herbert Pun, MD;  Location: [facility] ORS;  Service: General;  Laterality: Left;  . KNEE ARTHROSCOPY Left   . LEFT HEART CATH Bilateral 08/04/2016   Procedure: Left Heart Cath poss PCI;  Surgeon: Wellington Hampshire, MD;  Location: [place] [facility];  Service: Cardiovascular;  Laterality: Bilateral;  . LEFT HEART CATH AND CORONARY ANGIOGRAPHY Left 02/19/2017   Procedure: LEFT HEART CATH AND CORONARY ANGIOGRAPHY;  Surgeon: Minna Merritts, MD;  Location: [place] [facility];  Service: Cardiovascular;  Laterality: Left;  . LEFT  HEART CATH AND CORONARY ANGIOGRAPHY Left 06/09/2017   Procedure: LEFT HEART CATH AND CORONARY ANGIOGRAPHY;  Surgeon: Nelva Bush, MD;  Location: [place] [facility];  Service: Cardiovascular;  Laterality: Left;  . LEFT HEART CATH AND CORONARY ANGIOGRAPHY N/A 04/10/2020   Procedure: LEFT HEART CATH AND CORONARY ANGIOGRAPHY;  Surgeon: Wellington Hampshire, MD;  Location: [place] [facility];  Service: Cardiovascular;  Laterality: N/A;  . LEFT HEART CATHETERIZATION WITH CORONARY ANGIOGRAM N/A 06/15/2014   Procedure: LEFT HEART CATHETERIZATION WITH CORONARY ANGIOGRAM;  Surgeon: Jettie Booze, MD;  Location: [facility] CATH LAB;  Service: Cardiovascular;  Laterality: N/A;  . PERCUTANEOUS CORONARY STENT INTERVENTION (PCI-S)  06/15/2014   Procedure: PERCUTANEOUS CORONARY STENT INTERVENTION (PCI-S);  Surgeon: Jettie Booze, MD;  Location: [facility] CATH LAB;  Service: Cardiovascular;;  . TONSILLECTOMY    . TUBAL LIGATION       Home Medications:  Prior to Admission medications   Medication  Sig Start Date Kaylia Winborne Date Taking? Authorizing Provider  albuterol (PROVENTIL HFA;VENTOLIN HFA) 108 (90 Base) MCG/ACT inhaler Inhale 2 puffs into the lungs every 4 (four) hours as needed for wheezing or shortness of breath.    [provider]  aspirin EC 81 MG EC tablet Take 1 tablet (81 mg total) by mouth daily. 02/27/15   Gladstone Lighter, MD  blood glucose meter kit and supplies KIT Dispense based on patient and insurance preference. Use up to four times daily as directed. (FOR ICD-9 250.00, 250.01). 04/11/20   Nicole Kindred A, DO  Blood Glucose Monitoring Suppl W/DEVICE KIT Test 1-2 times daily; E11.65 diagnosis 03/02/15   Rubbie Battiest, RN  cephALEXin (KEFLEX) 500 MG capsule Take 1 capsule (500 mg total) by mouth 3 (three) times daily. 07/27/20   Harvest Dark, MD  clopidogrel (PLAVIX) 75 MG tablet Take 1 tablet (75 mg total) by mouth daily. 07/27/20   Loel Dubonnet, NP  furosemide (LASIX) 20 MG  tablet Take 1 tablet (20 mg total) by mouth as needed for fluid or edema. 07/27/20   Loel Dubonnet, NP  HYDROcodone-acetaminophen (NORCO/VICODIN) 5-325 MG tablet Take 1 tablet by mouth every 4 (four) hours as needed for moderate pain. 07/27/20 07/27/21  Harvest Dark, MD  lisinopril (ZESTRIL) 20 MG tablet Take 1 tablet (20 mg total) by mouth daily. 07/27/20 01/23/21  Loel Dubonnet, NP  metoprolol succinate (TOPROL-XL) 50 MG 24 hr tablet Take 1 tablet (50 mg total) by mouth in the morning and at bedtime. Take with or immediately following a meal. 07/27/20   Loel Dubonnet, NP  nitroGLYCERIN (NITROSTAT) 0.4 MG SL tablet Place under the tongue. 02/28/19   [provider]  ondansetron (ZOFRAN ODT) 4 MG disintegrating tablet Take 1 tablet (4 mg total) by mouth every 8 (eight) hours as needed for nausea or vomiting. 06/22/20   Paulette Blanch, MD  ranolazine (RANEXA) 500 MG 12 hr tablet Take 1 tablet (500 mg total) by mouth 2 (two) times daily. 12/09/19   Rise Mu, PA-C    Inpatient Medications: Scheduled Meds: . [START ON 09/06/2020] aspirin EC  81 mg Oral Daily  . atorvastatin  80 mg Oral Daily  . Chlorhexidine Gluconate Cloth  6 each Topical Q0600  . [START ON 09/06/2020] enoxaparin (LOVENOX) injection  40 mg Subcutaneous Q24H  . insulin aspart  0-15 Units Subcutaneous TID WC  . insulin aspart  0-5 Units Subcutaneous QHS  . lisinopril  20 mg Oral Daily  . metoprolol succinate  50 mg Oral BID WC  . ranolazine  500 mg Oral BID  . sodium chloride flush  3 mL Intravenous Q12H  . ticagrelor  90 mg Oral BID   Continuous Infusions: . sodium chloride     PRN Meds: sodium chloride, acetaminophen, albuterol, hydrALAZINE, HYDROcodone-acetaminophen, labetalol, nitroGLYCERIN, ondansetron (ZOFRAN) IV, sodium chloride flush  Allergies:    Allergies  Allergen Reactions  . Buprenorphine Hcl     Nausea and vomiting  . Carbamazepine Anaphylaxis and Other (See Comments)    Blood dyscrasia when  on with Lithium  . Lithium Other (See Comments)    Blood dyscrasia  . Lodine [Etodolac] Shortness Of Breath, Diarrhea, Nausea And Vomiting and Rash  . Methocarbamol Other (See Comments) and Shortness Of Breath    Unknown Rash, tachypnea  . Codeine Other (See Comments)    Upset stomach  . Levofloxacin In D5w Swelling and Other (See Comments)    "Salty" sensation in mouth. "Hard  time to explain it"  . Tape Other (See Comments)    Welps, blister on skin  . Wound Dressing Adhesive   . Relafen [Nabumetone] Diarrhea, Nausea And Vomiting and Rash    Social History:   Social History   Tobacco Use  . Smoking status: Current Every Day Smoker    Packs/day: 0.25    Years: 35.00    Pack years: 8.75    Types: Cigarettes  . Smokeless tobacco: Never Used  . Tobacco comment: Patches  Vaping Use  . Vaping Use: Never used  Substance Use Topics  . Alcohol use: Yes    Alcohol/week: 0.0 standard drinks    Comment: occ  . Drug use: Yes    Types: Marijuana    Comment: past week     Family History:   Family History  Problem Relation Age of Onset  . Lung cancer Mother   . Cirrhosis Father      ROS:  Please see the history of present illness. All other ROS reviewed and negative.     Physical Exam/Data:   Vitals:   09/05/20 0330 09/05/20 0332 09/05/20 0342 09/05/20 0600  BP: (!) 153/94   (!) 130/91  Pulse: 84  84 88  Resp: _0 Temp:    98.1 F (36.7 C)  TempSrc:      SpO2: 92% 93% 97% 91%  Weight:      Height:       No intake or output data in the 24 hours ending 09/05/20 0626 Last 3 Weights 09/04/2020 07/27/2020 07/27/2020  Weight (lbs) 161 lb 161 lb 161 lb  Weight (kg) 73.029 kg 73.029 kg 73.029 kg     Body mass index is 29.45 kg/m.  General: Somnolent but uncomfortable appearing woman lying on stretcher. HEENT: normal Lymph: no adenopathy Neck: no JVD Endocrine:  No thryomegaly Vascular: No carotid bruits; 2+ radial pulses bilaterally. Cardiac:  normal S1, S2;  RRR; no murmurs, rubs, or gallops. Lungs:  clear to auscultation bilaterally, no wheezing, rhonchi or rales  Abd: soft, nontender, no hepatomegaly  Ext: no edema Musculoskeletal:  No deformities, BUE and BLE strength normal and equal Skin: warm and dry  Neuro:  CNs 2-12 intact, no focal abnormalities noted Psych: Flat affect  EKG:  The EKG was personally reviewed and demonstrates:  09/04/2020 at 11:21 PM: Normal sinus rhythm with less than 1 mm of ST elevation in inferior and anterolateral leads.  There appears to be PR depression in multiple leads.  09/05/2020 at 3:12 AM: Normal sinus rhythm with 1 to 2 mm inferior and subtle lateral ST segment elevation. Telemetry:  Telemetry was personally reviewed and demonstrates: Sinus rhythm with PVCs  Relevant CV Studies: LHC/PCI (09/05/2020): 1. Severe single-vessel coronary artery disease with thrombotic 100% occlusion at the proximal margin of distal RCA stent with thrombus embolization into small RPL branches. 2. Mild proximal/mid RCA in-stent restenosis and stable sequential 50-80% RPDA lesions. 3. Nonobstructive CAD involving the left coronary artery, similar to prior catheterization in 03/2020. 4. Low normal left ventricular contraction with inferior hypokinesis (LVEF 50-55%) and moderately elevated filling pressure (LVEDP 25-30 mmHg). 5. Successful PCI to distal RCA with placement of overlapping Resolute Onyx 3.0 x 18 mm (proximal) and 3.0 x 15 mm (distal) drug-eluting stents with 0% residual stenosis and TIMI-3 flow.  The majority of the RCA is now stented with the proximal and distal segments having 2 layers.    Laboratory Data:  High Sensitivity Troponin:   Recent  Labs  Lab 09/04/20 2344 09/05/20 0156  TROPONINIHS 27* 154*     Chemistry Recent Labs  Lab 09/04/20 2344  NA 132*  K 4.0  CL 98  CO2 20*  GLUCOSE 588*  BUN 20  CREATININE 1.03*  CALCIUM 9.6  GFRNONAA >60  ANIONGAP 14    Recent Labs  Lab 09/04/20 2344   PROT 7.9  ALBUMIN 4.9  AST 13*  ALT 18  ALKPHOS 113  BILITOT 1.1   Hematology Recent Labs  Lab 09/04/20 2344  WBC 17.5*  RBC 5.10  HGB 16.0*  HCT 47.5*  MCV 93.1  MCH 31.4  MCHC 33.7  RDW 13.3  PLT 297   BNPNo results for input(s): BNP, PROBNP in the last 168 hours.  DDimer No results for input(s): DDIMER in the last 168 hours.   Radiology/Studies:  DG Chest 2 View  Result Date: 09/04/2020 CLINICAL DATA:  Chest pain on the left for a few hours EXAM: CHEST - 2 VIEW COMPARISON:  04/09/2020 FINDINGS: Cardiac shadow is within normal limits. The lungs are well aerated bilaterally. No focal infiltrate or effusion is seen. Degenerative changes of the thoracic spine are noted. Stable postoperative changes in the right shoulder are noted. IMPRESSION: No acute abnormality noted. Electronically Signed   By: Inez Catalina M.D.   On: 09/04/2020 23:53   CARDIAC CATHETERIZATION  Result Date: 09/05/2020 Conclusions: 1. Severe single-vessel coronary artery disease with thrombotic 100% occlusion at the proximal margin of distal RCA stent with thrombus embolization into small RPL branches. 2. Mild proximal/mid RCA in-stent restenosis and stable sequential 50-80% RPDA lesions. 3. Nonobstructive CAD involving the left coronary artery, similar to prior catheterization in 03/2020. 4. Low normal left ventricular contraction with inferior hypokinesis (LVEF 50-55%) and moderately elevated filling pressure (LVEDP 25-30 mmHg). 5. Successful PCI to distal RCA with placement of overlapping Resolute Onyx 3.0 x 18 mm (proximal) and 3.0 x 15 mm (distal) drug-eluting stents with 0% residual stenosis and TIMI-3 flow.  The majority of the RCA is now stented with the proximal and distal segments having 2 layers. Recommendations: 1. Continue tirofiban infusion for 18 hours. 2. Dual antiplatelet therapy with aspirin and ticagrelor for at least 12 months, ideally longer given extensive stenting in the RCA. 3. Gentle  diuresis; patient received furosemide 20 mg IV x1 at the Anela Bensman of the catheterization. 4. Aggressive secondary prevention including improved blood pressure and glycemic control, as well as tobacco/marijuana cessation. Nelva Bush, MD [facility]    Assessment and Plan:   Inferior STEMI: Patient presented with acute onset of chest pain at 73 last night after hearing of her brothers unexpected death.  Initial EKG showed subtle ST segment elevations more consistent with pericarditis and not diagnostic of STEMI.  Troponins were slightly elevated.  Repeat EKG in the setting of ongoing chest pain at 312 was diagnostic for STEMI, prompting referral for emergent cardiac catheterization and ultimate PCI to occluded distal RCA.  Chest pain significantly improved and in the setting of some residual occlusions involving distal RPL branches from embolized thrombus.  Given history, stress-induced cardiomyopathy was also a concern, though left ventriculogram was not consistent with this.  Admit to ICU on the hospitalist service for post STEMI management (case discussed with Dr. Damita Dunnings).  Continue tirofiban infusion for 18 hours.  Dual antiplatelet therapy with aspirin and ticagrelor for at least 12 months, ideally longer in the setting of extensive stenting of the RCA.  Aggressive secondary prevention including atorvastatin 80 mg daily and  improved blood pressure/glycemic control.  Trend troponin until it has peaked, then stop.  Obtain echocardiogram.  Ischemic cardiomyopathy: LVEF low normal with inferior hypokinesis on ventriculogram during today's catheterization.  Obtain transthoracic echocardiogram.  Restart home doses of metoprolol succinate and lisinopril.  Continue gentle diuresis; furosemide 20 mg IV x1 was given at the Uzma Hellmer of the catheterization.  Redosing may be necessary.  Hypertension: Uncontrolled in the setting of having been without medications for at least 3 days.  Restart  metoprolol succinate and lisinopril at home doses.  Hyperlipidemia:  Check fasting lipid panel.  Target LDL less than 70.  Atorvastatin 80 mg daily.  Uncontrolled type 2 diabetes mellitus: Glucose noted to be greater than 500 on admission and immediately after catheterization.  Sliding scale insulin.  Further management per hospitalist team.  Polysubstance abuse: Patient continues to smoke tobacco and marijuana.  Tobacco and marijuana cessation encouraged.  Check urine drug screen.  Risk Assessment/Risk Scores:     TIMI Risk Score for ST  Elevation MI:   The patient's TIMI risk score is 2, which indicates a 2.2% risk of all cause mortality at 30 days.   New York Heart Association (NYHA) Functional Class NYHA Class II  For questions or updates, please contact [facility] Please consult [email or web address] for contact info under [facility].  Signed, Nelva Bush, MD  09/05/2020 6:26 AM

## 2020-09-05 NOTE — ED Provider Notes (Signed)
[facility] Emergency Department Provider Note  ____________________________________________   Event Date/Time   First MD Initiated Contact with Patient 09/04/20 2337     (approximate)  I have reviewed the triage vital signs and the nursing notes.   HISTORY  Chief Complaint Chest Pain    HPI Jennifer [name] is a 60 y.o. female with medical history as listed below which notably includes known coronary artery disease and a recent  cardiac catheterization about 5 months ago during an NSTEMI.  She presents tonight for cute onset chest pain and heaviness, headache, nausea, and vomiting.  She said that the chest pain radiates up into her neck.  It feels similar to prior chest pain.  She took some nitroglycerin at home but it did not seem to help.    She felt fine earlier today but the symptoms came on acutely after finding out that her brother was found dead at his house and apparently had been dead for 2 days.  Immediately after getting this news she started to feel ill with all the symptoms as described above.  She describes the symptoms as severe and she is vomiting at the time she was placed in the room in the emergency department.  Nothing in particular including the nitroglycerin made her feel better or worse.  She denies recent fever and she denies shortness of breath although she has been coughing recently and says that she thinks she is seeing some blood in it.        Past Medical History:  Diagnosis Date  . Anxiety   . Arthritis    "qwhere" (06/14/2014)  . Bipolar disorder (HCC)   . CAD (coronary artery disease)    a. 2008 PCI/DES to RCA; b. 2012 PCI: 95% mRCA s/p PCI/DES; b. 2/16 PCI DES-> 90% dRCA; c. 11/16 PCI: dRCA ISR s/p PCI/DES;  d. 4/18 PCI: p-mRCA 95% s/p PCI/DES; e. 02/2017 Cath: LML nl, LAD nl, D1 40, LCX min irregs, OM1 60, RCA 50-60p/m ISR, 35m/d, patent dRCA stent, RPDA 90ost (fills via L->R collats), EF 55-65%.  . Chronic bronchitis  (HCC)    "get it q yr"  . Daily headache    "when my blood pressure is up" (06/14/2014)  . Depression   . Diastolic dysfunction    a. 02/2017 Echo: EF 55-60%, no rwma, Gr1 DD, nl RV fxn.  [name] GERD (gastroesophageal reflux disease)   . Heart murmur   . High cholesterol   . History of stomach ulcers   . Hypertension   . Insomnia   . Kidney stones   . Migraine    "haven't had them in a good while; did get them 1-2 times/yr" (06/14/2014)  . Myocardial infarction (HCC) 2008; ~ 2012  . Pneumonia    "get it 1-2 times/year" (06/14/2014)  . RLS (restless legs syndrome)   . Seizures (HCC)   . Sleep apnea    "they want me to do the lab but I haven't" (06/14/2014)  . Stroke [facility])    "they say I've had several" (06/14/2014)  . Tobacco abuse   . Type II diabetes mellitus (HCC)   . Urinary, incontinence, stress female     Patient Active Problem List   Diagnosis Date Noted  . Smoking   . Primary hypertension   . COPD exacerbation (HCC) 07/24/2018  . Chronic obstructive pulmonary disease (HCC) 07/20/2018  . Cellulitis 02/06/2018  . Sepsis (HCC) 02/06/2018  . Abnormal stress test 06/09/2017  . Unstable angina (HCC)  08/03/2016  . Morbid obesity ([place]) 05/02/2016  . Chest pain with moderate risk for cardiac etiology 03/14/2015  . CAD (coronary artery disease)   . Hospital discharge follow-up 03/07/2015  . Poorly controlled type 2 diabetes mellitus with complication ([place]) 29/51/8841  . Hyperlipidemia 06/22/2014  . Stented coronary artery   . Chronic pain syndrome   . Chest pain at rest 06/14/2014  . Acute bronchitis 06/14/2014  . Tobacco abuse 06/14/2014  . Essential hypertension 06/14/2014  . GERD (gastroesophageal reflux disease) 06/14/2014  . Bipolar disorder ([place]) 06/14/2014  . Chest pain 06/14/2014    Past Surgical History:  Procedure Laterality Date  . ABDOMINAL HYSTERECTOMY  1984   "I've got 1 ovary left"  . CARDIAC CATHETERIZATION N/A 02/26/2015   Procedure: Left Heart Cath  and Coronary Angiography;  Surgeon: Wellington Hampshire, MD;  Location: [place] CV LAB;  Service: Cardiovascular;  Laterality: N/A;  . CARDIAC CATHETERIZATION N/A 02/26/2015   Procedure: Coronary Stent Intervention;  Surgeon: Wellington Hampshire, MD;  Location: [place] [facility];  Service: Cardiovascular;  Laterality: N/A;  . CESAREAN SECTION  1984  . CORONARY ANGIOPLASTY WITH STENT PLACEMENT  2008; ~ 2012   "1; 1"  . CORONARY BALLOON ANGIOPLASTY N/A 06/09/2017   Procedure: CORONARY BALLOON ANGIOPLASTY;  Surgeon: Nelva Bush, MD;  Location: [place] [facility];  Service: Cardiovascular;  Laterality: N/A;  . CORONARY STENT INTERVENTION N/A 08/04/2016   Procedure: Coronary Stent Intervention;  Surgeon: Wellington Hampshire, MD;  Location: [place] [facility];  Service: Cardiovascular;  Laterality: N/A;  . DILATION AND CURETTAGE OF UTERUS    . EXTRACORPOREAL SHOCK WAVE LITHOTRIPSY  X 2  . I & D EXTREMITY Left 02/09/2018   Procedure: IRRIGATION AND DEBRIDEMENT EXTREMITY;  Surgeon: Herbert Pun, MD;  Location: [facility] ORS;  Service: General;  Laterality: Left;  . KNEE ARTHROSCOPY Left   . LEFT HEART CATH Bilateral 08/04/2016   Procedure: Left Heart Cath poss PCI;  Surgeon: Wellington Hampshire, MD;  Location: [place] [facility];  Service: Cardiovascular;  Laterality: Bilateral;  . LEFT HEART CATH AND CORONARY ANGIOGRAPHY Left 02/19/2017   Procedure: LEFT HEART CATH AND CORONARY ANGIOGRAPHY;  Surgeon: Minna Merritts, MD;  Location: [place] [facility];  Service: Cardiovascular;  Laterality: Left;  . LEFT HEART CATH AND CORONARY ANGIOGRAPHY Left 06/09/2017   Procedure: LEFT HEART CATH AND CORONARY ANGIOGRAPHY;  Surgeon: Nelva Bush, MD;  Location: [place] [facility];  Service: Cardiovascular;  Laterality: Left;  . LEFT HEART CATH AND CORONARY ANGIOGRAPHY N/A 04/10/2020   Procedure: LEFT HEART CATH AND CORONARY ANGIOGRAPHY;  Surgeon: Wellington Hampshire, MD;  Location: [place] CV  LAB;  Service: Cardiovascular;  Laterality: N/A;  . LEFT HEART CATHETERIZATION WITH CORONARY ANGIOGRAM N/A 06/15/2014   Procedure: LEFT HEART CATHETERIZATION WITH CORONARY ANGIOGRAM;  Surgeon: Jettie Booze, MD;  Location: [facility] CATH LAB;  Service: Cardiovascular;  Laterality: N/A;  . PERCUTANEOUS CORONARY STENT INTERVENTION (PCI-S)  06/15/2014   Procedure: PERCUTANEOUS CORONARY STENT INTERVENTION (PCI-S);  Surgeon: Jettie Booze, MD;  Location: [facility] CATH LAB;  Service: Cardiovascular;;  . TONSILLECTOMY    . TUBAL LIGATION      Prior to Admission medications   Medication Sig Start Date End Date Taking? Authorizing Provider  albuterol (PROVENTIL HFA;VENTOLIN HFA) 108 (90 Base) MCG/ACT inhaler Inhale 2 puffs into the lungs every 4 (four) hours as needed for wheezing or shortness of breath.    [provider]  aspirin EC 81  MG EC tablet Take 1 tablet (81 mg total) by mouth daily. 02/27/15   Gladstone Lighter, MD  blood glucose meter kit and supplies KIT Dispense based on patient and insurance preference. Use up to four times daily as directed. (FOR ICD-9 250.00, 250.01). 04/11/20   Nicole Kindred A, DO  Blood Glucose Monitoring Suppl W/DEVICE KIT Test 1-2 times daily; E11.65 diagnosis 03/02/15   Rubbie Battiest, RN  cephALEXin (KEFLEX) 500 MG capsule Take 1 capsule (500 mg total) by mouth 3 (three) times daily. 07/27/20   Harvest Dark, MD  clopidogrel (PLAVIX) 75 MG tablet Take 1 tablet (75 mg total) by mouth daily. 07/27/20   Loel Dubonnet, NP  furosemide (LASIX) 20 MG tablet Take 1 tablet (20 mg total) by mouth as needed for fluid or edema. 07/27/20   Loel Dubonnet, NP  HYDROcodone-acetaminophen (NORCO/VICODIN) 5-325 MG tablet Take 1 tablet by mouth every 4 (four) hours as needed for moderate pain. 07/27/20 07/27/21  Harvest Dark, MD  lisinopril (ZESTRIL) 20 MG tablet Take 1 tablet (20 mg total) by mouth daily. 07/27/20 01/23/21  Loel Dubonnet, NP  metoprolol succinate  (TOPROL-XL) 50 MG 24 hr tablet Take 1 tablet (50 mg total) by mouth in the morning and at bedtime. Take with or immediately following a meal. 07/27/20   Loel Dubonnet, NP  nitroGLYCERIN (NITROSTAT) 0.4 MG SL tablet Place under the tongue. 02/28/19   [provider]  ondansetron (ZOFRAN ODT) 4 MG disintegrating tablet Take 1 tablet (4 mg total) by mouth every 8 (eight) hours as needed for nausea or vomiting. 06/22/20   Paulette Blanch, MD  ranolazine (RANEXA) 500 MG 12 hr tablet Take 1 tablet (500 mg total) by mouth 2 (two) times daily. 12/09/19   Rise Mu, PA-C    Allergies Buprenorphine hcl, Carbamazepine, Lithium, Lodine [etodolac], Methocarbamol, Codeine, Levofloxacin in d5w, Tape, Wound dressing adhesive, and Relafen [nabumetone]  Family History  Problem Relation Age of Onset  . Lung cancer Mother   . Cirrhosis Father     Social History Social History   Tobacco Use  . Smoking status: Current Every Day Smoker    Packs/day: 0.25    Years: 35.00    Pack years: 8.75    Types: Cigarettes  . Smokeless tobacco: Never Used  . Tobacco comment: Patches  Vaping Use  . Vaping Use: Never used  Substance Use Topics  . Alcohol use: Yes    Alcohol/week: 0.0 standard drinks    Comment: occ  . Drug use: Yes    Types: Marijuana    Comment: past week    Review of Systems Constitutional: No fever/chills Eyes: No visual changes. ENT: No sore throat. Cardiovascular: Positive for chest pain and pressure. Respiratory: Denies shortness of breath.  Positive for cough of possible hemoptysis. Gastrointestinal: Positive for nausea and vomiting.  Negative for abdominal pain. Genitourinary: Negative for dysuria. Musculoskeletal: Negative for back pain. Integumentary: Negative for rash. Neurological: Generalized headache.  No focal weakness or numbness.   ____________________________________________   PHYSICAL EXAM:  VITAL SIGNS: ED Triage Vitals  Enc Vitals Group     BP 09/04/20  2332 (!) 179/116     Pulse Rate 09/04/20 2332 93     Resp 09/04/20 2332 (!) 24     Temp 09/04/20 2332 97.9 F (36.6 C)     Temp Source 09/04/20 2332 Oral     SpO2 09/04/20 2332 98 %     Weight 09/04/20 2333 73 kg (161  lb)     Height 09/04/20 2333 1.575 m ($Remove'5\' 2"'pUSFDAw$ )     Head Circumference --      Peak Flow --      Pain Score 09/04/20 2333 10     Pain Loc --      Pain Edu? --      Excl. in [place]? --     Constitutional: Alert and oriented.  Eyes: Conjunctivae are normal.  Head: Atraumatic. Nose: No congestion/rhinnorhea. Mouth/Throat: Patient is wearing a mask. Neck: No stridor.  No meningeal signs.   Cardiovascular: Hypertension.  Normal rate, regular rhythm. Good peripheral circulation. Respiratory: Normal respiratory effort.  No retractions. Gastrointestinal: Soft and nondistended, guarding because the patient is in between episodes of vomiting, but no tenderness to palpation. Musculoskeletal: No lower extremity tenderness nor edema. No gross deformities of extremities. Neurologic:  Normal speech and language. No gross focal neurologic deficits are appreciated.  Skin:  Skin is warm, dry and intact. Psychiatric: Mood and affect are normal. Speech and behavior are normal.  ____________________________________________   LABS (all labs ordered are listed, but only abnormal results are displayed)  Labs Reviewed  CBC - Abnormal; Notable for the following components:      Result Value   WBC 17.5 (*)    Hemoglobin 16.0 (*)    HCT 47.5 (*)    All other components within normal limits  COMPREHENSIVE METABOLIC PANEL - Abnormal; Notable for the following components:   Sodium 132 (*)    CO2 20 (*)    Glucose, Bld 588 (*)    Creatinine, Ser 1.03 (*)    AST 13 (*)    All other components within normal limits  TROPONIN I (HIGH SENSITIVITY) - Abnormal; Notable for the following components:   Troponin I (High Sensitivity) 27 (*)    All other components within normal limits  TROPONIN I  (HIGH SENSITIVITY) - Abnormal; Notable for the following components:   Troponin I (High Sensitivity) 154 (*)    All other components within normal limits  RESP PANEL BY RT-PCR (FLU A&B, COVID) ARPGX2  LIPASE, BLOOD  PROTIME-INR  APTT   ____________________________________________  EKG  ED ECG REPORT I, Hinda Kehr, the attending physician, personally viewed and interpreted this ECG.  Date: 09/04/2020 EKG Time: 23:21 Rate: 97 Rhythm: normal sinus rhythm QRS Axis: normal Intervals: QTC 495 ms, otherwise unremarkable ST/T Wave abnormalities: Generalized ST segment elevation but without reciprocal changes.  Unchanged from prior, but seems more consistent with a pericarditis pattern as opposed to STEMI. Narrative Interpretation: Concerning for ischemia but does not meet criteria for STEMI.  ED ECG REPORT I, Hinda Kehr, the attending physician, personally viewed and interpreted this ECG.  Date: 09/05/2020 EKG Time: 3:12 AM Rate: 90 Rhythm: normal sinus rhythm QRS Axis: normal Intervals: normal, QTc 445 ms ST/T Wave abnormalities: More pronounced ST segment elevation particularly in inferior leads with some reciprocal changes as well. Narrative Interpretation: Consistent with evolving STEMI while in the emergency department.     ____________________________________________  RADIOLOGY I, Hinda Kehr, personally viewed and evaluated these images (plain radiographs) as part of my medical decision making, as well as reviewing the written report by the radiologist.  ED MD interpretation: No acute abnormalities identified on chest x-ray  Official radiology report(s): DG Chest 2 View  Result Date: 09/04/2020 CLINICAL DATA:  Chest pain on the left for a few hours EXAM: CHEST - 2 VIEW COMPARISON:  04/09/2020 FINDINGS: Cardiac shadow is within normal limits. The lungs are well aerated  bilaterally. No focal infiltrate or effusion is seen. Degenerative changes of the thoracic spine  are noted. Stable postoperative changes in the right shoulder are noted. IMPRESSION: No acute abnormality noted. Electronically Signed   By: Inez Catalina M.D.   On: 09/04/2020 23:53    ____________________________________________   PROCEDURES   Procedure(s) performed (including Critical Care):  .Critical Care Performed by: Hinda Kehr, MD Authorized by: Hinda Kehr, MD   Critical care provider statement:    Critical care time (minutes):  45   Critical care time was exclusive of:  Separately billable procedures and treating other patients   Critical care was necessary to treat or prevent imminent or life-threatening deterioration of the following conditions:  Cardiac failure   Critical care was time spent personally by me on the following activities:  Development of treatment plan with patient or surrogate, discussions with consultants, evaluation of patient's response to treatment, examination of patient, obtaining history from patient or surrogate, ordering and performing treatments and interventions, ordering and review of laboratory studies, ordering and review of radiographic studies, pulse oximetry, re-evaluation of patient's condition and review of old charts .1-3 Lead EKG Interpretation Performed by: Hinda Kehr, MD Authorized by: Hinda Kehr, MD     Interpretation: normal     ECG rate:  85   ECG rate assessment: normal     Rhythm: sinus rhythm     Ectopy: none     Conduction: normal       ____________________________________________   INITIAL IMPRESSION / MDM / ASSESSMENT AND PLAN / ED COURSE  As part of my medical decision making, I reviewed the following data within the [place] notes reviewed and incorporated, Labs reviewed , EKG interpreted , Old EKG reviewed, Old chart reviewed, Radiograph reviewed , A consult was requested and obtained from this/these consultant(s) Cardiology and Notes from prior ED visits   Differential  diagnosis includes, but is not limited to, ACS, angina, stress/anxiety/panic, musculoskeletal pain, less likely PE.  Patient has known heart disease but had a relatively recent catheterization that was generally reassuring and did not require intervention.  Her last troponin on record was in the 700s when she was admitted for an NSTEMI.  Her blood pressure tonight is quite elevated and I will treat with labetalol 10 mg IV.  She and her sister-in-law who is at bedside both confirmed that she was having no symptoms at all until finding out the terrible news about her brother.  The patient's initial triage EKG was identified is concerning but not meeting STEMI criteria and the patient was brought back to my room.  I reviewed the patient's EKG as well as her old EKG and confirmed that there is a change but that the ST segment elevation seems to be more global, there are no specific reciprocal changes, and in my opinion it does not meet criteria as a STEMI.  The patient's QTc interval is prolonged at nearly 500 ms which limits my options in terms of antiemetics.  Given the emotional trauma she is going through, will try Ativan 1 mg IV to see if this helps with the nausea and as a calming agent.  Lab work is all pending including a troponin.  The patient is on the cardiac monitor to evaluate for evidence of arrhythmia and/or significant heart rate changes.  I personally reviewed the patient's imaging and agree with the radiologist's interpretation that there are no acute abnormalities on chest x-ray.     Clinical Course as  of 09/05/20 0356  Wed Sep 05, 2020  0031 Glucose(!!): 588 Ordering LR 1000 mL bolus [CF]  0041 Patient still vomiting, patient is still complaining of headache.  I believe she needs an adjuvant medication to help with all of her symptoms.  Her QTC is slightly prolonged and any antiemetic runs a risk of QTC elongation, but she is on the cardiac monitor and I believe a small dose of  droperidol 1.25 mg IV will help with all of her symptoms including as a calming agent and as an antiemetic.  Placing order. [CF]  0159 Patient is irritated and pulled out an IV, possibly by accident, and says that she wants to go.  I explained to her that her troponin is slightly elevated and we need to see if it is going up anymore.  I also explained to her that her blood pressure is quite high although this could be baseline or due to the emotional trauma she sustained tonight.  I recommended that she at least stay for repeat troponin.  She continues to complain of headache and also says she still feels some heaviness in her chest.  I agreed to give her 2 Percocet and another Zofran 4 mg ODT but I explained about her prolonged QTc interval and my reluctance to provide high doses of IV medication.  She agrees with the current plan and then we will reassess based on her troponin. [CF]  0255 Troponin I (High Sensitivity)(!!): 154 [CF]  0255 Troponin I (High Sensitivity)(!!): 154 Troponin is going up in the setting of known heart disease and ongoing chest pressure as well as uncontrolled hypertension.  Labetalol 10 mg IV had virtually no effect on her blood pressure.  I am putting on an inch of nitroglycerin paste and giving another dose of labetalol 20 mg IV, starting heparin, and will admit the patient. [CF]  0319 After further consideration, I consulted by phone with Dr. Saunders Revel with cardiology given the patient's history of relatively recent heart catheterization but ongoing chest pain and poorly controlled hypertension and worsening troponin, as well as the borderline original EKG.  I talked with him by phone and he recommended repeating an EKG which we did while on the phone together.  The repeat EKG is similar but with more pronounced inferior changes than prior, consistent with an evolving infarction, now with some reciprocal changes on the EKG that were not present earlier.  He believes that the safest thing  for the patient is to activate code STEMI and take patient to the Cath Lab.  I updated the patient with the plan and Dr. Saunders Revel and his staff will be coming in to take her to the Cath Lab. [CF]  0354 Of note, the rest the patient's labs are generally reassuring other than her hyperglycemia.  She has a nonspecific leukocytosis of 17.5.  COVID test is negative.  Lipase is normal.  Coags are normal. [CF]  0355 I discussed the case in person with Dr. Saunders Revel after he evaluated the patient.  He agrees that this is consistent with an evolving infarction even though the initial EKG was nondiagnostic, and he is taking her to the Cath Lab. [CF]  [phone] Of note, I ordered heparin bolus and aspirin 324 mg as per the STEMI protocol. [CF]    Clinical Course User Index [CF] Hinda Kehr, MD     ____________________________________________  FINAL CLINICAL IMPRESSION(S) / ED DIAGNOSES  Final diagnoses:  ST elevation myocardial infarction (STEMI), unspecified artery ([place])  Uncontrolled  hypertension     MEDICATIONS GIVEN DURING THIS VISIT:  Medications  LORazepam (ATIVAN) injection 1 mg (1 mg Intravenous Given 09/05/20 0021)  lactated ringers bolus 1,000 mL (1,000 mLs Intravenous New Bag/Given 09/05/20 0041)  droperidol (INAPSINE) 2.5 MG/ML injection 1.25 mg (1.25 mg Intravenous Given 09/05/20 0049)  labetalol (NORMODYNE) injection 10 mg (10 mg Intravenous Given 09/05/20 0224)  oxyCODONE-acetaminophen (PERCOCET/ROXICET) 5-325 MG per tablet 2 tablet (2 tablets Oral Given 09/05/20 0207)  ondansetron (ZOFRAN-ODT) disintegrating tablet 4 mg (4 mg Oral Given 09/05/20 0207)  labetalol (NORMODYNE) injection 20 mg (20 mg Intravenous Given 09/05/20 0324)  nitroGLYCERIN (NITROGLYN) 2 % ointment 1 inch (1 inch Topical Given 09/05/20 0326)  heparin bolus via infusion 4,000 Units (4,000 Units Intravenous Bolus from Bag 09/05/20 0329)  aspirin chewable tablet 324 mg (324 mg Oral Given 09/05/20 0327)     ED Discharge Orders     None      *Please note:  Jennifer [name] was evaluated in Emergency Department on 09/05/2020 for the symptoms described in the history of present illness. She was evaluated in the context of the global COVID-19 pandemic, which necessitated consideration that the patient might be at risk for infection with the SARS-CoV-2 virus that causes COVID-19. Institutional protocols and algorithms that pertain to the evaluation of patients at risk for COVID-19 are in a state of rapid change based on information released by regulatory bodies including the CDC and federal and state organizations. These policies and algorithms were followed during the patient's care in the ED.  Some ED evaluations and interventions may be delayed as a result of limited staffing during and after the pandemic.*  Note:  This document was prepared using Dragon voice recognition software and may include unintentional dictation errors.   Hinda Kehr, MD 09/05/20 [phone]

## 2020-09-05 NOTE — Progress Notes (Signed)
No familt with patient. Chaplain offered emotional support.

## 2020-09-05 NOTE — ED Notes (Signed)
Pt transported to Cath Lab by this RN and Dr. Okey Dupre.

## 2020-09-05 NOTE — H&P (Signed)
History and Physical    Jennifer [name] YLT:[id number] DOB: 11/08/1960 DOA: 09/04/2020  Referring MD/NP/PA:   PCP: Althea Grimmer, MD   Patient coming from:  The patient is coming from home.  At baseline, pt is independent for most of ADL.        Chief Complaint: chest pain  HPI: Jennifer [name] is a 60 y.o. female with medical history significant of CAD, non-STEMI, stent placement, hypertension, hyperlipidemia, diabetes mellitus depression, anxiety, tobacco abuse, seizure (not taking medications, approximately once a year), RLS, dCHF, chronic bronchitis, CKD stage II, depression, anxiety, who presents with chest pain.   Patient states that her chest pain started at about 8 PM yesterday after knowing that her brother was found dead at his house and apparently had been dead for 2 days.  The chest pain is located in substernal area, moderate to severe, dull, heaviness, radiating to neck.  Associated with nausea and vomiting, no diarrhea or abdominal pain.  She has mild dry cough and mild shortness of breath, no fever or chills.  No symptoms of UTI. She took some nitroglycerin at home, but it did not seem to help.   Pt was found to have elevated troponin and STEMI on EKG.  Cardiology was consulted, patient underwent urgent cardiac catheterization by Dr. Okey Dupre.  Cardiac cath by Dr. Okey Dupre: Conclusions: 1. Severe single-vessel coronary artery disease with thrombotic 100% occlusion at the proximal margin of distal RCA stent with thrombus embolization into small RPL branches. 2. Mild proximal/mid RCA in-stent restenosis and stable sequential 50-80% RPDA lesions. 3. Nonobstructive CAD involving the left coronary artery, similar to prior catheterization in 03/2020. 4. Low normal left ventricular contraction with inferior hypokinesis (LVEF 50-55%) and moderately elevated filling pressure (LVEDP 25-30 mmHg). 5. Successful PCI to distal RCA with placement of overlapping Resolute Onyx 3.0 x 18 mm  (proximal) and 3.0 x 15 mm (distal) drug-eluting stents with 0% residual stenosis and TIMI-3 flow.  The majority of the RCA is now stented with the proximal and distal segments having 2 layers.  Recommendations: 1. Continue tirofiban infusion for 18 hours. 2. Dual antiplatelet therapy with aspirin and ticagrelor for at least 12 months, ideally longer given extensive stenting in the RCA. 3. Gentle diuresis; patient received furosemide 20 mg IV x1 at the end of the catheterization. 4. Aggressive secondary prevention including improved blood pressure and glycemic control, as well as tobacco/marijuana cessation.   ED Course: pt was found to have trop 27 -->154 --> >27000, WBC 17.5, INR 0.9, negative COVID PCR, renal function stable, GFR> 60, temperature normal, blood pressure 260/116, 130/91, heart rate 88, RR 31, 16, oxygen saturation 91% on room air -->96% on 2L oxygen.  Chest x-ray negative.  Patient is admitted to ICU as inpatient.   Review of Systems:   General: no fevers, chills, no body weight gain, has poor appetite, has fatigue HEENT: no blurry vision, hearing changes or sore throat Respiratory: no dyspnea, coughing, wheezing CV: no chest pain, no palpitations GI: no nausea, vomiting, abdominal pain, diarrhea, constipation GU: no dysuria, burning on urination, increased urinary frequency, hematuria  Ext: no leg edema Neuro: no unilateral weakness, numbness, or tingling, no vision change or hearing loss Skin: no rash, no skin tear. MSK: No muscle spasm, no deformity, no limitation of range of movement in spin Heme: No easy bruising.  Travel history: No recent long distant travel.  Allergy:  Allergies  Allergen Reactions  . Buprenorphine Hcl     Nausea  and vomiting  . Carbamazepine Anaphylaxis and Other (See Comments)    Blood dyscrasia when on with Lithium  . Lithium Other (See Comments)    Blood dyscrasia  . Lodine [Etodolac] Shortness Of Breath, Diarrhea, Nausea And  Vomiting and Rash  . Methocarbamol Other (See Comments) and Shortness Of Breath    Unknown Rash, tachypnea  . Codeine Other (See Comments)    Upset stomach  . Levofloxacin In D5w Swelling and Other (See Comments)    "Salty" sensation in mouth. "Hard time to explain it"  . Tape Other (See Comments)    Welps, blister on skin  . Wound Dressing Adhesive   . Relafen [Nabumetone] Diarrhea, Nausea And Vomiting and Rash    Past Medical History:  Diagnosis Date  . Anxiety   . Arthritis    "qwhere" (06/14/2014)  . Bipolar disorder ([place])   . CAD (coronary artery disease)    a. 2008 PCI/DES to RCA; b. 2012 PCI: 95% mRCA s/p PCI/DES; b. 2/16 PCI DES-> 90% dRCA; c. 11/16 PCI: dRCA ISR s/p PCI/DES;  d. 4/18 PCI: p-mRCA 95% s/p PCI/DES; e. 02/2017 Cath: LML nl, LAD nl, D1 40, LCX min irregs, OM1 60, RCA 50-60p/m ISR, 37m/d, patent dRCA stent, RPDA 90ost (fills via L->R collats), EF 55-65%.  . Chronic bronchitis ([place])    "get it q yr"  . Daily headache    "when my blood pressure is up" (06/14/2014)  . Depression   . Diastolic dysfunction    a. 02/2017 Echo: EF 55-60%, no rwma, Gr1 DD, nl RV fxn.  [name] GERD (gastroesophageal reflux disease)   . Heart murmur   . High cholesterol   . History of stomach ulcers   . Hypertension   . Insomnia   . Kidney stones   . Migraine    "haven't had them in a good while; did get them 1-2 times/yr" (06/14/2014)  . Myocardial infarction ([place]) 2008; ~ 2012  . Pneumonia    "get it 1-2 times/year" (06/14/2014)  . RLS (restless legs syndrome)   . Seizures ([place])   . Sleep apnea    "they want me to do the lab but I haven't" (06/14/2014)  . Stroke [facility])    "they say I've had several" (06/14/2014)  . Tobacco abuse   . Type II diabetes mellitus ([place])   . Urinary, incontinence, stress female     Past Surgical History:  Procedure Laterality Date  . ABDOMINAL HYSTERECTOMY  1984   "I've got 1 ovary left"  . CARDIAC CATHETERIZATION N/A 02/26/2015   Procedure: Left Heart  Cath and Coronary Angiography;  Surgeon: Wellington Hampshire, MD;  Location: [place] [facility];  Service: Cardiovascular;  Laterality: N/A;  . CARDIAC CATHETERIZATION N/A 02/26/2015   Procedure: Coronary Stent Intervention;  Surgeon: Wellington Hampshire, MD;  Location: [place] [facility];  Service: Cardiovascular;  Laterality: N/A;  . CESAREAN SECTION  1984  . CORONARY ANGIOPLASTY WITH STENT PLACEMENT  2008; ~ 2012   "1; 1"  . CORONARY BALLOON ANGIOPLASTY N/A 06/09/2017   Procedure: CORONARY BALLOON ANGIOPLASTY;  Surgeon: Nelva Bush, MD;  Location: [place] [facility];  Service: Cardiovascular;  Laterality: N/A;  . CORONARY STENT INTERVENTION N/A 08/04/2016   Procedure: Coronary Stent Intervention;  Surgeon: Wellington Hampshire, MD;  Location: [place] [facility];  Service: Cardiovascular;  Laterality: N/A;  . CORONARY/GRAFT ACUTE MI REVASCULARIZATION N/A 09/05/2020   Procedure: Coronary/Graft Acute MI Revascularization;  Surgeon: Nelva Bush, MD;  Location: [place]  [facility];  Service: Cardiovascular;  Laterality: N/A;  . DILATION AND CURETTAGE OF UTERUS    . EXTRACORPOREAL SHOCK WAVE LITHOTRIPSY  X 2  . I & D EXTREMITY Left 02/09/2018   Procedure: IRRIGATION AND DEBRIDEMENT EXTREMITY;  Surgeon: Herbert Pun, MD;  Location: [facility] ORS;  Service: General;  Laterality: Left;  . KNEE ARTHROSCOPY Left   . LEFT HEART CATH Bilateral 08/04/2016   Procedure: Left Heart Cath poss PCI;  Surgeon: Wellington Hampshire, MD;  Location: [place] [facility];  Service: Cardiovascular;  Laterality: Bilateral;  . LEFT HEART CATH AND CORONARY ANGIOGRAPHY Left 02/19/2017   Procedure: LEFT HEART CATH AND CORONARY ANGIOGRAPHY;  Surgeon: Minna Merritts, MD;  Location: [place] [facility];  Service: Cardiovascular;  Laterality: Left;  . LEFT HEART CATH AND CORONARY ANGIOGRAPHY Left 06/09/2017   Procedure: LEFT HEART CATH AND CORONARY ANGIOGRAPHY;  Surgeon: Nelva Bush, MD;  Location: [place] [facility]  [facility];  Service: Cardiovascular;  Laterality: Left;  . LEFT HEART CATH AND CORONARY ANGIOGRAPHY N/A 04/10/2020   Procedure: LEFT HEART CATH AND CORONARY ANGIOGRAPHY;  Surgeon: Wellington Hampshire, MD;  Location: [place] [facility];  Service: Cardiovascular;  Laterality: N/A;  . LEFT HEART CATH AND CORONARY ANGIOGRAPHY N/A 09/05/2020   Procedure: LEFT HEART CATH AND CORONARY ANGIOGRAPHY;  Surgeon: Nelva Bush, MD;  Location: [place] [facility];  Service: Cardiovascular;  Laterality: N/A;  . LEFT HEART CATHETERIZATION WITH CORONARY ANGIOGRAM N/A 06/15/2014   Procedure: LEFT HEART CATHETERIZATION WITH CORONARY ANGIOGRAM;  Surgeon: Jettie Booze, MD;  Location: [facility] CATH LAB;  Service: Cardiovascular;  Laterality: N/A;  . PERCUTANEOUS CORONARY STENT INTERVENTION (PCI-S)  06/15/2014   Procedure: PERCUTANEOUS CORONARY STENT INTERVENTION (PCI-S);  Surgeon: Jettie Booze, MD;  Location: [facility] C[facility] LAB;  Service: Cardiovascular;;  . TONSILLECTOMY    . TUBAL LIGATION      Social History:  reports that she has been smoking cigarettes. She has a 8.75 pack-year smoking history. She has never used smokeless tobacco. She reports current alcohol use. She reports current drug use. Drug: Marijuana.  Family History:  Family History  Problem Relation Age of Onset  . Lung cancer Mother   . Cirrhosis Father      Prior to Admission medications   Medication Sig Start Date End Date Taking? Authorizing Provider  albuterol (PROVENTIL HFA;VENTOLIN HFA) 108 (90 Base) MCG/ACT inhaler Inhale 2 puffs into the lungs every 4 (four) hours as needed for wheezing or shortness of breath.    [provider]  aspirin EC 81 MG EC tablet Take 1 tablet (81 mg total) by mouth daily. 02/27/15   Gladstone Lighter, MD  blood glucose meter kit and supplies KIT Dispense based on patient and insurance preference. Use up to four times daily as directed. (FOR ICD-9 250.00, 250.01). 04/11/20   Nicole Kindred A, DO  Blood  Glucose Monitoring Suppl W/DEVICE KIT Test 1-2 times daily; E11.65 diagnosis 03/02/15   Rubbie Battiest, RN  cephALEXin (KEFLEX) 500 MG capsule Take 1 capsule (500 mg total) by mouth 3 (three) times daily. 07/27/20   Harvest Dark, MD  clopidogrel (PLAVIX) 75 MG tablet Take 1 tablet (75 mg total) by mouth daily. 07/27/20   Loel Dubonnet, NP  furosemide (LASIX) 20 MG tablet Take 1 tablet (20 mg total) by mouth as needed for fluid or edema. 07/27/20   Loel Dubonnet, NP  HYDROcodone-acetaminophen (NORCO/VICODIN) 5-325 MG tablet Take 1 tablet by mouth every 4 (four) hours as  needed for moderate pain. 07/27/20 07/27/21  Harvest Dark, MD  lisinopril (ZESTRIL) 20 MG tablet Take 1 tablet (20 mg total) by mouth daily. 07/27/20 01/23/21  Loel Dubonnet, NP  metoprolol succinate (TOPROL-XL) 50 MG 24 hr tablet Take 1 tablet (50 mg total) by mouth in the morning and at bedtime. Take with or immediately following a meal. 07/27/20   Loel Dubonnet, NP  nitroGLYCERIN (NITROSTAT) 0.4 MG SL tablet Place under the tongue. 02/28/19   [provider]  ondansetron (ZOFRAN ODT) 4 MG disintegrating tablet Take 1 tablet (4 mg total) by mouth every 8 (eight) hours as needed for nausea or vomiting. 06/22/20   Paulette Blanch, MD  ranolazine (RANEXA) 500 MG 12 hr tablet Take 1 tablet (500 mg total) by mouth 2 (two) times daily. 12/09/19   Rise Mu, PA-C    Physical Exam: Vitals:   09/05/20 0342 09/05/20 0600 09/05/20 0700 09/05/20 0800  BP:  (!) 130/91 121/82 134/89  Pulse: 84 88 88 83  Resp: $Remo'18 16 16 17  'OWWXR$ Temp:  98.1 F (36.7 C)  98 F (36.7 C)  TempSrc:    Axillary  SpO2: 97% 91% 90% 96%  Weight:      Height:       General: Not in acute distress HEENT:       Eyes: PERRL, EOMI, no scleral icterus.       ENT: No discharge from the ears and nose, no pharynx injection, no tonsillar enlargement.        Neck: No JVD, no bruit, no mass felt. Heme: No neck lymph node enlargement. Cardiac: S1/S2, RRR, No  murmurs, No gallops or rubs. Respiratory: No rales, wheezing, rhonchi or rubs. GI: Soft, nondistended, nontender, no rebound pain, no organomegaly, BS present. GU: No hematuria Ext: No pitting leg edema bilaterally. 1+DP/PT pulse bilaterally. Musculoskeletal: No joint deformities, No joint redness or warmth, no limitation of ROM in spin. Skin: No rashes.  Neuro: Alert, oriented X3, cranial nerves II-XII grossly intact, moves all extremities normally. Muscle strength 5/5 in all extremities, sensation to light touch intact. Brachial reflex 2+ bilaterally. Knee reflex 1+ bilaterally. Negative Babinski's sign. Normal finger to nose test. Psych: Patient is not psychotic, no suicidal or hemocidal ideation.  Labs on Admission: I have personally reviewed following labs and imaging studies  CBC: Recent Labs  Lab 09/04/20 2344  WBC 17.5*  HGB 16.0*  HCT 47.5*  MCV 93.1  PLT 620   Basic Metabolic Panel: Recent Labs  Lab 09/04/20 2344  NA 132*  K 4.0  CL 98  CO2 20*  GLUCOSE 588*  BUN 20  CREATININE 1.03*  CALCIUM 9.6   GFR: Estimated Creatinine Clearance: 56.4 mL/min (A) (by C-G formula based on SCr of 1.03 mg/dL (H)). Liver Function Tests: Recent Labs  Lab 09/04/20 2344  AST 13*  ALT 18  ALKPHOS 113  BILITOT 1.1  PROT 7.9  ALBUMIN 4.9   Recent Labs  Lab 09/04/20 2344  LIPASE 36   No results for input(s): AMMONIA in the last 168 hours. Coagulation Profile: Recent Labs  Lab 09/05/20 0320  INR 0.9   Cardiac Enzymes: No results for input(s): CKTOTAL, CKMB, CKMBINDEX, TROPONINI in the last 168 hours. BNP (last 3 results) No results for input(s): PROBNP in the last 8760 hours. HbA1C: Recent Labs    09/05/20 0618  HGBA1C 14.3*   CBG: Recent Labs  Lab 09/05/20 0548 09/05/20 0725 09/05/20 0903  GLUCAP 517* 475* 414*  Lipid Profile: Recent Labs    09/05/20 0618  CHOL 304*  HDL 55  LDLCALC 227*  TRIG 108  CHOLHDL 5.5   Thyroid Function Tests: No  results for input(s): TSH, T4TOTAL, FREET4, T3FREE, THYROIDAB in the last 72 hours. Anemia Panel: No results for input(s): VITAMINB12, FOLATE, FERRITIN, TIBC, IRON, RETICCTPCT in the last 72 hours. Urine analysis:    Component Value Date/Time   COLORURINE AMBER (A) 07/27/2020 1809   APPEARANCEUR HAZY (A) 07/27/2020 1809   APPEARANCEUR Hazy 01/30/2014 2236   LABSPEC 1.030 07/27/2020 1809   LABSPEC 1.019 01/30/2014 2236   PHURINE 5.0 07/27/2020 1809   GLUCOSEU >=500 (A) 07/27/2020 1809   GLUCOSEU Negative 01/30/2014 2236   HGBUR MODERATE (A) 07/27/2020 1809   BILIRUBINUR NEGATIVE 07/27/2020 1809   BILIRUBINUR Negative 01/30/2014 2236   KETONESUR NEGATIVE 07/27/2020 1809   PROTEINUR NEGATIVE 07/27/2020 1809   NITRITE POSITIVE (A) 07/27/2020 1809   LEUKOCYTESUR MODERATE (A) 07/27/2020 1809   LEUKOCYTESUR 2+ 01/30/2014 2236   Sepsis Labs: $RemoveBefo'@LABRCNTIP'CdNyJuHfLHr$ (procalcitonin:4,lacticidven:4) ) Recent Results (from the past 240 hour(s))  Resp Panel by RT-PCR (Flu A&B, Covid) Nasopharyngeal Swab     Status: None   Collection Time: 09/05/20 12:30 AM   Specimen: Nasopharyngeal Swab; Nasopharyngeal(NP) swabs in vial transport medium  Result Value Ref Range Status   SARS Coronavirus 2 by RT PCR NEGATIVE NEGATIVE Final    Comment: (NOTE) SARS-CoV-2 target nucleic acids are NOT DETECTED.  The SARS-CoV-2 RNA is generally detectable in upper respiratory specimens during the acute phase of infection. The lowest concentration of SARS-CoV-2 viral copies this assay can detect is 138 copies/mL. A negative result does not preclude SARS-Cov-2 infection and should not be used as the sole basis for treatment or other patient management decisions. A negative result may occur with  improper specimen collection/handling, submission of specimen other than nasopharyngeal swab, presence of viral mutation(s) within the areas targeted by this assay, and inadequate number of viral copies(<138 copies/mL). A negative  result must be combined with clinical observations, patient history, and epidemiological information. The expected result is Negative.  Fact Sheet for Patients:  EntrepreneurPulse.com.au  Fact Sheet for Healthcare Providers:  IncredibleEmployment.be  This test is no t yet approved or cleared by the [place] FDA and  has been authorized for detection and/or diagnosis of SARS-CoV-2 by FDA under an Emergency Use Authorization (EUA). This EUA will remain  in effect (meaning this test can be used) for the duration of the COVID-19 declaration under Section 564(b)(1) of the Act, 21 U.S.C.section 360bbb-3(b)(1), unless the authorization is terminated  or revoked sooner.       Influenza A by PCR NEGATIVE NEGATIVE Final   Influenza B by PCR NEGATIVE NEGATIVE Final    Comment: (NOTE) The Xpert Xpress SARS-CoV-2/FLU/RSV plus assay is intended as an aid in the diagnosis of influenza from Nasopharyngeal swab specimens and should not be used as a sole basis for treatment. Nasal washings and aspirates are unacceptable for Xpert Xpress SARS-CoV-2/FLU/RSV testing.  Fact Sheet for Patients: EntrepreneurPulse.com.au  Fact Sheet for Healthcare Providers: IncredibleEmployment.be  This test is not yet approved or cleared by the [place] FDA and has been authorized for detection and/or diagnosis of SARS-CoV-2 by FDA under an Emergency Use Authorization (EUA). This EUA will remain in effect (meaning this test can be used) for the duration of the COVID-19 declaration under Section 564(b)(1) of the Act, 21 U.S.C. section 360bbb-3(b)(1), unless the authorization is terminated or revoked.  Performed at [facility],  [place], [place] [address]   MRSA PCR Screening     Status: None   Collection Time: 09/05/20  5:55 AM   Specimen: Nasopharyngeal  Result Value Ref Range Status   MRSA by PCR  NEGATIVE NEGATIVE Final    Comment:        The GeneXpert MRSA Assay (FDA approved for NASAL specimens only), is one component of a comprehensive MRSA colonization surveillance program. It is not intended to diagnose MRSA infection nor to guide or monitor treatment for MRSA infections. Performed at [facility], [address] [place], [place] [address]      Radiological Exams on Admission: DG Chest 2 View  Result Date: 09/04/2020 CLINICAL DATA:  Chest pain on the left for a few hours EXAM: CHEST - 2 VIEW COMPARISON:  04/09/2020 FINDINGS: Cardiac shadow is within normal limits. The lungs are well aerated bilaterally. No focal infiltrate or effusion is seen. Degenerative changes of the thoracic spine are noted. Stable postoperative changes in the right shoulder are noted. IMPRESSION: No acute abnormality noted. Electronically Signed   By: Inez Catalina M.D.   On: 09/04/2020 23:53   CARDIAC CATHETERIZATION  Addendum Date: 09/05/2020   Conclusions: 1. Severe single-vessel coronary artery disease with thrombotic 100% occlusion at the proximal margin of distal RCA stent with thrombus embolization into small RPL branches. 2. Mild proximal/mid RCA in-stent restenosis and stable sequential 50-80% RPDA lesions. 3. Nonobstructive CAD involving the left coronary artery, similar to prior catheterization in 03/2020. 4. Low normal left ventricular contraction with inferior hypokinesis (LVEF 50-55%) and moderately elevated filling pressure (LVEDP 25-30 mmHg). 5. Successful PCI to distal RCA with placement of overlapping Resolute Onyx 3.0 x 18 mm (proximal) and 3.0 x 15 mm (distal) drug-eluting stents with 0% residual stenosis and TIMI-3 flow.  The majority of the RCA is now stented with the proximal and distal segments having 2 layers. Recommendations: 1. Continue tirofiban infusion for 18 hours. 2. Dual antiplatelet therapy with aspirin and ticagrelor for at least 12 months, ideally longer given  extensive stenting in the RCA. 3. Gentle diuresis; patient received furosemide 20 mg IV x1 at the end of the catheterization. 4. Aggressive secondary prevention including improved blood pressure and glycemic control, as well as tobacco/marijuana cessation. Nelva Bush, MD [facility]   Result Date: 09/05/2020 Conclusions: 1. Severe single-vessel coronary artery disease with thrombotic 100% occlusion at the proximal margin of distal RCA stent with thrombus embolization into small RPL branches. 2. Mild proximal/mid RCA in-stent restenosis and stable sequential 50-80% RPDA lesions. 3. Nonobstructive CAD involving the left coronary artery, similar to prior catheterization in 03/2020. 4. Low normal left ventricular contraction with inferior hypokinesis (LVEF 50-55%) and moderately elevated filling pressure (LVEDP 25-30 mmHg). 5. Successful PCI to distal RCA with placement of overlapping Resolute Onyx 3.0 x 18 mm (proximal) and 3.0 x 15 mm (distal) drug-eluting stents with 0% residual stenosis and TIMI-3 flow.  The majority of the RCA is now stented with the proximal and distal segments having 2 layers. Recommendations: 1. Continue tirofiban infusion for 18 hours. 2. Dual antiplatelet therapy with aspirin and ticagrelor for at least 12 months, ideally longer given extensive stenting in the RCA. 3. Gentle diuresis; patient received furosemide 20 mg IV x1 at the end of the catheterization. 4. Aggressive secondary prevention including improved blood pressure and glycemic control, as well as tobacco/marijuana cessation. Nelva Bush, MD [facility]     EKG: I have personally reviewed.  At sinus  rhythm, QTC 495, ST elevation in inferior leads and V3-V6, early R wave progression  Assessment/Plan Principal Problem:   STEMI (ST elevation myocardial infarction) ([place]) Active Problems:   Tobacco abuse   Essential hypertension   Hyperlipidemia   CAD (coronary artery disease)   Chronic obstructive  pulmonary disease (HCC)   Hypertensive urgency   CKD (chronic kidney disease), stage II   Type II diabetes mellitus with renal manifestations (HCC)   Leukocytosis   Seizure (HCC)   STEMI (ST elevation myocardial infarction) and hx of CAD: trop  27 -->154 --> >27000.  Cardiology was consulted, patient underwent urgent cardiac catheterization with drug-eluting stent placement by Dr. Saunders Revel.  Her chest pain has subsided.  - admit to ICU as inpatient - Trend Trop - Risk factor stratification: will check FLP and A1C  - check UDS - 2d echo - lipitor 80 mg daily - Metoprolol, Ranexa, as needed nitroglycerin - continue tirofiban infusion for 18 hours per Dr. Saunders Revel - per Dr. Saunders Revel, dual antiplatelet therapy with aspirin and ticagrelor for at least 12 months, ideally longer given extensive stenting in the RCA.  Seizure: not taking meds now -Seizure precaution -When necessary Ativan for seizure  Tobacco abuse -Nicotine patch  Essential hypertension and Hypertensive urgency: -continue home lisinopril, metoprolol -As needed hydralazine and labetalol  Hyperlipidemia -Lipitor  Chronic obstructive pulmonary disease ([place]): Stable -Bronchodilators  CKD (chronic kidney disease), stage II: Stable, GFR> 60, creatinine 1.03.  BUN 20 -Follow-up by BMP  Type II diabetes mellitus with renal manifestations [facility]): Recent A1c 14.6, poorly controlled. No med on her med list for DM -SSI with meal coverate -add lantus 10 unit daily  Leukocytosis: WBC 17.5.  No fever.  No source of infection identified.  Possibly reactive - follow-up by CBC   DVT ppx: SQ Lovenox Code Status: Full code Family Communication: Yes, patient's  Son by  Phone Disposition Plan:  Anticipate discharge back to previous environment Consults called:  Dr. Saunders Revel Admission status and Level of care: ICU:   as inpt        Status is: Inpatient  Remains inpatient appropriate because:Inpatient level of care appropriate due to  severity of illness   Dispo: The patient is from: Home              Anticipated d/c is to: Home              Patient currently is not medically stable to d/c.   Difficult to place patient No          Date of Service 09/05/2020    Ivor Costa [facility]   If 7PM-7AM, please contact night-coverage [email or web address] 09/05/2020, 11:11 AM

## 2020-09-05 NOTE — ED Notes (Signed)
EKG VORB from Dr. York Cerise per cardiology request.  EKG performed, given to Dr. York Cerise and exported to Epic.  Per Dr. Okey Dupre patient to be taken to Cath Lab.  Patient being dressed to hospital gown, and prepped for cath.  Belongings include: shorts, shirt, bra, phone, purse, shoes, charger.

## 2020-09-05 NOTE — ED Notes (Signed)
Iv meds given.  Pt alert.   

## 2020-09-05 NOTE — Progress Notes (Addendum)
Inpatient Diabetes Program Recommendations  AACE/ADA: New Consensus Statement on Inpatient Glycemic Control   Target Ranges:  Prepandial:   less than 140 mg/dL      Peak postprandial:   less than 180 mg/dL (1-2 hours)      Critically ill patients:  140 - 180 mg/dL   Results for Jennifer [name], Jennifer [name] (MRN [id number]) as of 09/05/2020 09:52  Ref. Range 09/05/2020 05:48 09/05/2020 07:25 09/05/2020 09:03  Glucose-Capillary Latest Ref Range: 70 - 99 mg/dL 619 (HH) 509 (H) 326 (H)  Results for Jennifer [name], Jennifer [name] (MRN [id number]) as of 09/05/2020 09:52  Ref. Range 09/04/2020 23:44  Glucose Latest Ref Range: 70 - 99 mg/dL [facility])  Results for Jennifer [name], Jennifer [name] (MRN [id number]) as of 09/05/2020 09:52  Ref. Range 04/09/2020 00:53  Hemoglobin A1C Latest Ref Range: 4.8 - 5.6 % 14.6 (H)   Review of Glycemic Control  Diabetes history: DM2 Outpatient Diabetes medications: Tresiba 78 units QHS, Victoza 1.8 mg daily, Metformin   Current orders for Inpatient glycemic control: Lantus 10 units daily, Novolog 5 units TID with meals, Novolog 0-15 units TID with meals, Novolog 0-5 units QHS  Inpatient Diabetes Program Recommendations:    Insulin: Please consider increasing Lantus to 25 units daily.  HbgA1C: Current A1C in process. A1C was 14.6% on 04/09/20.    NOTE: Patient admitted with STEMI and initial glucose 588 mg/dl on 7/34/19. Patient was last inpatient 04/09/20-04/11/20 and was seen by inpatient diabetes coordinator on 04/11/20. Patient had reported that she was not taking DM medications for 2 months because she had lost her glucometer and did not want to take medications due to fear of having hypoglycemia and not able to check CBGs.   Addendum 09/05/20@12 :50-Spoke with patient over the phone about diabetes and home regimen for diabetes control. Patient reports that she has not seen her last PCP in a long time and she states that her current Medicaid card has [facility] listed as PCP but she has not ever been seen  there.  Will consult TOC to assist with setting up follow up. Patient states that she has transportation issues, having to care for family members, along with other social issues that have kept her from being able to follow up with providers. Patient states that her and her family were just recently evicted from their home and she just moved into a place of her own. She states she is trying to get things set up and straightened out. She states that she was taking [place] 78 units daily, Victoza 1.8 mg daily, and Metformin occasionally when she ate something sweet.  Patient reports that she has had her Metformin but her Evaristo Bury and Victoza were at the other residence and she does not think she will be able to get them back. Patient states she last took [place] and Victoza 1-2 weeks ago.  Patient reports that she has a glucometer and some testing supplies but has not been checking glucose consistently. Discussed glucose and A1C goals. Discussed importance of checking CBGs and maintaining good CBG control to prevent long-term and short-term complications. Explained how hyperglycemia leads to damage within blood vessels which lead to the common complications seen with uncontrolled diabetes. Stressed to the patient the importance of improving glycemic control to prevent further complications from uncontrolled diabetes. Discussed impact of nutrition, exercise, stress, sickness, and medications on diabetes control. Patient reports she has lots of stress in her life and her brother just passed away which has added to her stress.  Patient states she was seeing a mental health provider and they dismissed her from their practice so she has not been able to get the medications she needs.  Provided emotional support and encouraged patient to try to get established with a PCP so she can get assistance with improving DM and addressing mental health issues as well. Encouraged patient to focus on her own health, monitor glucose,  take DM medications as prescribed, get established with a PCP, and follow up consistently.   Patient verbalized understanding of information discussed and reports no further questions at this time related to diabetes. At time of discharge, if prior outpatient DM medications are continued please provide Rx for: Tresiba insulin pens (#[id number]), insulin pen needles (#[id number]), Victoza, Metformin, and test strips for glucometer (#[id number]).   Thanks, Orlando Penner, RN, MSN, CDE Diabetes Coordinator Inpatient Diabetes Program [phone] (Team Pager from 8am to 5pm)

## 2020-09-05 NOTE — ED Notes (Signed)
md in with pt  No po fluids at this time per md.  Pt has a headache, nausea, chest pain.  md aware.

## 2020-09-05 NOTE — Progress Notes (Signed)
Attempted to start deflating TR band- withdrew 69ml of air and patient started bleeding from site.  48ml of air placed back in to TR band.

## 2020-09-05 NOTE — ED Notes (Signed)
md in with pt again  

## 2020-09-05 NOTE — ED Notes (Signed)
Dr. End at bedside. 

## 2020-09-05 NOTE — ED Notes (Signed)
meds given   Iv fluids infusing.  Family with pt.

## 2020-09-05 NOTE — ED Notes (Signed)
Called [facility] for a code stemi @ 3:18 AM Dr. York Cerise

## 2020-09-05 NOTE — ED Notes (Signed)
md at bedside.  Family with pt  nsr on monitor.  Pt alert.

## 2020-09-06 ENCOUNTER — Other Ambulatory Visit: Payer: Self-pay

## 2020-09-06 ENCOUNTER — Inpatient Hospital Stay (HOSPITAL_COMMUNITY)
Admit: 2020-09-06 | Discharge: 2020-09-06 | Disposition: A | Discharge status: Home / Self Care | Payer: Medicare HMO | Attending: Internal Medicine | Admitting: Internal Medicine

## 2020-09-06 DIAGNOSIS — I2111 ST elevation (STEMI) myocardial infarction involving right coronary artery: Secondary | ICD-10-CM | POA: Diagnosis not present

## 2020-09-06 DIAGNOSIS — Z720 Tobacco use: Secondary | ICD-10-CM

## 2020-09-06 DIAGNOSIS — I213 ST elevation (STEMI) myocardial infarction of unspecified site: Secondary | ICD-10-CM

## 2020-09-06 DIAGNOSIS — I10 Essential (primary) hypertension: Secondary | ICD-10-CM | POA: Diagnosis not present

## 2020-09-06 DIAGNOSIS — J449 Chronic obstructive pulmonary disease, unspecified: Secondary | ICD-10-CM | POA: Diagnosis not present

## 2020-09-06 DIAGNOSIS — I25118 Atherosclerotic heart disease of native coronary artery with other forms of angina pectoris: Secondary | ICD-10-CM | POA: Diagnosis not present

## 2020-09-06 DIAGNOSIS — E118 Type 2 diabetes mellitus with unspecified complications: Secondary | ICD-10-CM

## 2020-09-06 DIAGNOSIS — J432 Centrilobular emphysema: Secondary | ICD-10-CM

## 2020-09-06 LAB — GLUCOSE, CAPILLARY
Glucose-Capillary: 267 mg/dL — ABNORMAL HIGH (ref 70–99)
Glucose-Capillary: 510 mg/dL (ref 70–99)
Glucose-Capillary: 452 mg/dL — ABNORMAL HIGH (ref 70–99)

## 2020-09-06 LAB — BASIC METABOLIC PANEL
Anion gap: 11 (ref 5–15)
BUN: 29 mg/dL — ABNORMAL HIGH (ref 6–20)
CO2: 19 mmol/L — ABNORMAL LOW (ref 22–32)
Calcium: 8.8 mg/dL — ABNORMAL LOW (ref 8.9–10.3)
Chloride: 103 mmol/L (ref 98–111)
Creatinine, Ser: 0.79 mg/dL (ref 0.44–1.00)
GFR, Estimated: >60 mL/min (ref >60)
Glucose, Bld: 309 mg/dL — ABNORMAL HIGH (ref 70–99)
Potassium: 3.8 mmol/L (ref 3.5–5.1)
Sodium: 133 mmol/L — ABNORMAL LOW (ref 135–145)

## 2020-09-06 LAB — ECHOCARDIOGRAM COMPLETE
AR max vel: 2.06 cm2
AV Area VTI: 2.08 cm2
AV Area mean vel: 2.16 cm2
AV Mean grad: 3.5 mmHg
AV Peak grad: 7.2 mmHg
Ao pk vel: 1.35 m/s
Area-P 1/2: 3.24 cm2
Height: 62 in
S' Lateral: 2.6 cm
Weight: 2576 oz

## 2020-09-06 LAB — CBC
HCT: 44.9 % (ref 36.0–46.0)
Hemoglobin: 15 g/dL (ref 12.0–15.0)
MCH: 30.7 pg (ref 26.0–34.0)
MCHC: 33.4 g/dL (ref 30.0–36.0)
MCV: 92 fL (ref 80.0–100.0)
Platelets: 265 10*3/uL (ref 150–400)
RBC: 4.88 MIL/uL (ref 3.87–5.11)
RDW: 13.7 % (ref 11.5–15.5)
WBC: 13.5 10*3/uL — ABNORMAL HIGH (ref 4.0–10.5)
nRBC: 0 % (ref 0.0–0.2)

## 2020-09-06 MED ORDER — QUETIAPINE FUMARATE 200 MG PO TABS
200.0000 mg | ORAL_TABLET | Freq: Every day | ORAL | 0 refills | Status: AC
  Filled 2020-09-06: qty 30, 30d supply, fill #0

## 2020-09-06 MED ORDER — QUETIAPINE FUMARATE 200 MG PO TABS: 200.0000 mg | ORAL_TABLET | Freq: Every day | ORAL | Status: DC

## 2020-09-06 MED ORDER — BASAGLAR KWIKPEN 100 UNIT/ML ~~LOC~~ SOPN
40.0000 [IU] | PEN_INJECTOR | Freq: Every day | SUBCUTANEOUS | 11 refills | Status: DC
  Filled 2020-09-06: qty 15, 37d supply, fill #0
  Filled 2020-09-06: qty 10, 25d supply, fill #0

## 2020-09-06 MED ORDER — TICAGRELOR 90 MG PO TABS
90.0000 mg | ORAL_TABLET | Freq: Two times a day (BID) | ORAL | 3 refills | Status: DC
Start: 2020-09-06 — End: 2020-10-26
  Filled 2020-09-06: qty 60, 30d supply, fill #0

## 2020-09-06 MED ORDER — INSULIN ASPART 100 UNIT/ML IJ SOLN
5.0000 [IU] | Freq: Three times a day (TID) | INTRAMUSCULAR | Status: DC
  Administered 2020-09-06: 5 [IU] via SUBCUTANEOUS
  Filled 2020-09-06: qty 1

## 2020-09-06 MED ORDER — ALPRAZOLAM 0.5 MG PO TABS
0.5000 mg | ORAL_TABLET | Freq: Two times a day (BID) | ORAL | Status: DC | PRN
  Filled 2020-09-06: qty 1

## 2020-09-06 MED ORDER — ASPIRIN 81 MG PO TBEC: 81.0000 mg | DELAYED_RELEASE_TABLET | Freq: Every day | ORAL | 2 refills | Status: AC

## 2020-09-06 MED ORDER — ATORVASTATIN CALCIUM 80 MG PO TABS
1.0000 | ORAL_TABLET | Freq: Every day | ORAL | 3 refills | Status: DC
  Filled 2020-09-06 (×2): qty 30, 30d supply, fill #0

## 2020-09-06 MED ORDER — INSULIN GLARGINE 100 UNIT/ML ~~LOC~~ SOLN
40.0000 [IU] | Freq: Every day | SUBCUTANEOUS | Status: DC
  Administered 2020-09-06: 40 [IU] via SUBCUTANEOUS
  Filled 2020-09-06 (×2): qty 0.4

## 2020-09-06 MED ORDER — INSULIN ASPART 100 UNIT/ML IJ SOLN: 8.0000 [IU] | Freq: Three times a day (TID) | INTRAMUSCULAR | Status: DC

## 2020-09-06 MED ORDER — LISINOPRIL 20 MG PO TABS
20.0000 mg | ORAL_TABLET | Freq: Every day | ORAL | 1 refills | Status: DC
  Filled 2020-09-06: qty 30, 30d supply, fill #0

## 2020-09-06 MED ORDER — FUROSEMIDE 20 MG PO TABS
20.0000 mg | ORAL_TABLET | ORAL | 0 refills | Status: DC | PRN
  Filled 2020-09-06: qty 30, 30d supply, fill #0

## 2020-09-06 MED ORDER — NICOTINE 21 MG/24HR TD PT24
21.0000 mg | MEDICATED_PATCH | Freq: Every day | TRANSDERMAL | 0 refills | Status: DC
  Filled 2020-09-06: qty 28, 28d supply, fill #0

## 2020-09-06 MED ORDER — INSULIN GLARGINE 100 UNIT/ML ~~LOC~~ SOLN
20.0000 [IU] | Freq: Every day | SUBCUTANEOUS | Status: DC
  Filled 2020-09-06: qty 0.2

## 2020-09-06 MED ORDER — RANOLAZINE ER 500 MG PO TB12
500.0000 mg | ORAL_TABLET | Freq: Two times a day (BID) | ORAL | 3 refills | Status: DC
  Filled 2020-09-06: qty 60, 30d supply, fill #0

## 2020-09-06 MED ORDER — UNIFINE PENTIPS 31G X 5 MM MISC
12 refills | Status: DC
  Filled 2020-09-06: qty 100, 90d supply, fill #0

## 2020-09-06 MED ORDER — METOPROLOL SUCCINATE ER 50 MG PO TB24
50.0000 mg | ORAL_TABLET | Freq: Two times a day (BID) | ORAL | 3 refills | Status: DC
  Filled 2020-09-06: qty 60, 30d supply, fill #0

## 2020-09-06 MED ORDER — NITROGLYCERIN 0.4 MG SL SUBL
0.4000 mg | SUBLINGUAL_TABLET | SUBLINGUAL | 12 refills | Status: DC | PRN
  Filled 2020-09-06: qty 25, 8d supply, fill #0

## 2020-09-06 NOTE — Progress Notes (Signed)
Patient ID: Jennifer [name], female   DOB: 08-18-1960, 60 y.o.   MRN: [id number]  Seen patient earlier. Patient tells me she has a lot of stress going on. She has many kids to take care of at home. She is going to leave the hospital today no matter what. I asked her to wait till she seen by Dr. Mariah Milling cardiology. Ideally would like to monitor her another day but patient says her brother passed away yesterday and she has a lot of stuff to take care of at home.  Discussed with Dr. Mariah Milling. Will discharge patient to home and I'll try to get her medications through employee health including her Brilinta.

## 2020-09-06 NOTE — Discharge Summary (Addendum)
[facility] NAME: Jennifer [name]    MR#:  [id number]  DATE OF BIRTH:  [birth year]  DATE OF ADMISSION:  09/04/2020 ADMITTING PHYSICIAN: No admitting provider for patient encounter.  DATE OF DISCHARGE:09/06/2020  PRIMARY CARE PHYSICIAN: Mateo Flow, MD    ADMISSION DIAGNOSIS:  Hyperglycemia [R73.9] Uncontrolled hypertension [I10] ST elevation myocardial infarction (STEMI), unspecified artery ([place]) [I21.3] STEMI involving right coronary artery ([place]) [I21.11] STEMI (ST elevation myocardial infarction) ([place]) [I21.3]  DISCHARGE DIAGNOSIS:  STEMI-- inferior wall status post cardiac cath with stent x two  SECONDARY DIAGNOSIS:   Past Medical History:  Diagnosis Date  . Anxiety   . Arthritis    "qwhere" (06/14/2014)  . Bipolar disorder ([place])   . CAD (coronary artery disease)    a. 2008 PCI/DES to RCA; b. 2012 PCI: 95% mRCA s/p PCI/DES; b. 2/16 PCI DES-> 90% dRCA; c. 11/16 PCI: dRCA ISR s/p PCI/DES;  d. 4/18 PCI: p-mRCA 95% s/p PCI/DES; e. 02/2017 Cath: LML nl, LAD nl, D1 40, LCX min irregs, OM1 60, RCA 50-60p/m ISR, 61m/d, patent dRCA stent, RPDA 90ost (fills via L->R collats), EF 55-65%.  . Chronic bronchitis ([place])    "get it q yr"  . Daily headache    "when my blood pressure is up" (06/14/2014)  . Depression   . Diastolic dysfunction    a. 02/2017 Echo: EF 55-60%, no rwma, Gr1 DD, nl RV fxn.  [name] GERD (gastroesophageal reflux disease)   . Heart murmur   . High cholesterol   . History of stomach ulcers   . Hypertension   . Insomnia   . Kidney stones   . Migraine    "haven't had them in a good while; did get them 1-2 times/yr" (06/14/2014)  . Myocardial infarction ([place]) 2008; ~ 2012  . Pneumonia    "get it 1-2 times/year" (06/14/2014)  . RLS (restless legs syndrome)   . Seizures ([place])   . Sleep apnea    "they want me to do the lab but I haven't" (06/14/2014)  . Stroke [facility])    "they say I've had several" (06/14/2014)  .  Tobacco abuse   . Type II diabetes mellitus ([place])   . Urinary, incontinence, stress female     HOSPITAL COURSE:  Jennifer [name] is a 60 y.o. female with medical history significant of CAD, non-STEMI, stent placement, hypertension, hyperlipidemia, diabetes mellitus depression, anxiety, tobacco abuse, seizure (not taking medications, approximately once a year), RLS, dCHF, chronic bronchitis, CKD stage II, depression, anxiety, who presents with chest pain.  Inferior wallSTEMI (ST elevation myocardial infarction) and hx of CAD: trop  27 -->154 --> >27000.   --[facility] was consulted, patient underwent urgent cardiac catheterization with drug-eluting stent placement by Dr. Saunders Revel.  Her chest pain has subsided. --Catheterization showed nonobstructive left coronary artery disease and thrombotic occlusion of the distal RCA just proximal to the old distal RCA stent.  Significant thrombus was evident with extension into the old distal RCA stent as well as embolization into RPL branches.  Patient underwent successful PCI with placement of 2 overlapping drug-eluting stents in the mid/distal RCA such that most of the RCA is now stented (2 layers of stent in the proximal and distal vessel).  At this time, she reports only minimal chest discomfort. - lipitor 80 mg daily - Metoprolol, Ranexa, as needed nitroglycerin - continue tirofiban infusion for 18 hours per Dr. Geoffry Paradise - per Dr. Saunders Revel, dual antiplatelet therapy with aspirin  and ticagrelor for at least 12 months, ideally longer given extensive stenting in the RCA. --resumed lisinopril. -- Patient will get at least a month supply of all her home meds through employee pharmacy. She is recommended to follow-up with cardiology and PCP before she runs out of her medications to get the refills done.  QuestionableSeizure: not taking meds now -Seizure precaution -When necessary Ativan for seizure  Tobacco abuse -Nicotine patch  Essential  hypertension and Hypertensive urgency: -continue home lisinopril, metoprolol -As needed hydralazine and labetalol  Hyperlipidemia -Lipitor  Chronic obstructive pulmonary disease ([place]): Stable -Bronchodilators -- patient advised smoking cessation. Nicotine patch prescribed  CKD (chronic kidney disease), stage II: Stable, GFR> 60, creatinine 1.03.  BUN 20  Type II diabetes mellitus with renal manifestations [facility]): Recent A1c 14.6, poorly controlled. No med on her med list for DM -SSI with meal coverate -patient has taken insulin before. Have started her on Lantus 40 units daily. Recommend carb control diet. Compliance to medication discussed with patient. She needs to follow-up closely with her PCP also.  Leukocytosis: WBC 17.5.  No fever.  No source of infection identified.  Possibly reactive  Chronic anxiety -- recommended patient follow-up with PCP to help her with her chronic anxiety. She is also given resources to follow-up with [facility] mental health. --restarted her seroquel at bedtime--per pt request.  patient has been wanting to leave the hospital ASAP She has quite a bit of social issues at home. Discussed with Dr. Rockey Situ. Ideally would like patient to be monitored one more day however did not want her to leave AMA. Patient is recommended to close follow-up with cardiology. She did voice understanding   DVT ppx: SQ Lovenox Code Status: Full code Family Communication: none today Disposition Plan:  Anticipate discharge back to previous environment Consults called:  Drs. End/gollan Admission status and Level of care: ICU:   as inpt      CONSULTS OBTAINED:  Treatment Team:  Nelva Bush, MD  DRUG ALLERGIES:   Allergies  Allergen Reactions  . Buprenorphine Hcl     Nausea and vomiting  . Carbamazepine Anaphylaxis and Other (See Comments)    Blood dyscrasia when on with Lithium  . Lithium Other (See Comments)    Blood dyscrasia  . Lodine  [Etodolac] Shortness Of Breath, Diarrhea, Nausea And Vomiting and Rash  . Methocarbamol Other (See Comments) and Shortness Of Breath    Unknown Rash, tachypnea  . Codeine Other (See Comments)    Upset stomach  . Levofloxacin In D5w Swelling and Other (See Comments)    "Salty" sensation in mouth. "Hard time to explain it"  . Tape Other (See Comments)    Welps, blister on skin  . Wound Dressing Adhesive   . Relafen [Nabumetone] Diarrhea, Nausea And Vomiting and Rash    DISCHARGE MEDICATIONS:   Allergies as of 09/06/2020      Reactions   Buprenorphine Hcl    Nausea and vomiting   Carbamazepine Anaphylaxis, Other (See Comments)   Blood dyscrasia when on with Lithium   Lithium Other (See Comments)   Blood dyscrasia   Lodine [etodolac] Shortness Of Breath, Diarrhea, Nausea And Vomiting, Rash   Methocarbamol Other (See Comments), Shortness Of Breath   Unknown Rash, tachypnea   Codeine Other (See Comments)   Upset stomach   Levofloxacin In D5w Swelling, Other (See Comments)   "Salty" sensation in mouth. "Hard time to explain it"   Tape Other (See Comments)   Welps, blister on skin  Wound Dressing Adhesive    Relafen [nabumetone] Diarrhea, Nausea And Vomiting, Rash      Medication List    STOP taking these medications   cephALEXin 500 MG capsule Commonly known as: KEFLEX   clopidogrel 75 MG tablet Commonly known as: PLAVIX   HYDROcodone-acetaminophen 5-325 MG tablet Commonly known as: NORCO/VICODIN     TAKE these medications   albuterol 108 (90 Base) MCG/ACT inhaler Commonly known as: VENTOLIN HFA Inhale 2 puffs into the lungs every 4 (four) hours as needed for wheezing or shortness of breath.   aspirin 81 MG EC tablet Take 1 tablet (81 mg total) by mouth daily.   atorvastatin 80 MG tablet Commonly known as: LIPITOR Take 1 tablet (80 mg total) by mouth daily.   Basaglar KwikPen 100 UNIT/ML Inject 40 Units into the skin daily.   blood glucose meter kit and  supplies Kit Dispense based on patient and insurance preference. Use up to four times daily as directed. (FOR ICD-9 250.00, 250.01).   Blood Glucose Monitoring Suppl w/Device Kit Test 1-2 times daily; E11.65 diagnosis   Brilinta 90 MG Tabs tablet Generic drug: ticagrelor Take 1 tablet (90 mg total) by mouth 2 (two) times daily.   furosemide 20 MG tablet Commonly known as: LASIX Take 1 tablet (20 mg total) by mouth as needed for fluid or edema.   gabapentin 600 MG tablet Commonly known as: NEURONTIN Take 1 tablet by mouth daily.   lisinopril 20 MG tablet Commonly known as: ZESTRIL Take 1 tablet (20 mg total) by mouth daily. What changed: Another medication with the same name was removed. Continue taking this medication, and follow the directions you see here.   metoprolol succinate 50 MG 24 hr tablet Commonly known as: TOPROL-XL Take 1 tablet (50 mg total) by mouth in the morning and at bedtime. Take with or immediately following a meal.   nicotine 21 mg/24hr patch Commonly known as: NICODERM CQ - dosed in mg/24 hours Place 1 patch (21 mg total) onto the skin daily.   nitroGLYCERIN 0.4 MG SL tablet Commonly known as: NITROSTAT Place 1 tablet (0.4 mg total) under the tongue every 5 (five) minutes as needed for chest pain. What changed:  how much to take when to take this reasons to take this   ondansetron 4 MG disintegrating tablet Commonly known as: Zofran ODT Take 1 tablet (4 mg total) by mouth every 8 (eight) hours as needed for nausea or vomiting.   QUEtiapine 200 MG tablet Commonly known as: SEROQUEL Take 1 tablet (200 mg total) by mouth at bedtime.   ranolazine 500 MG 12 hr tablet Commonly known as: RANEXA Take 1 tablet (500 mg total) by mouth 2 (two) times daily.   Unifine Pentips 31G X 5 MM Misc Generic drug: Insulin Pen Needle use with basaglar       If you experience worsening of your admission symptoms, develop shortness of breath, life threatening  emergency, suicidal or homicidal thoughts you must seek medical attention immediately by calling 911 or calling your MD immediately  if symptoms less severe.  You Must read complete instructions/literature along with all the possible adverse reactions/side effects for all the Medicines you take and that have been prescribed to you. Take any new Medicines after you have completely understood and accept all the possible adverse reactions/side effects.   Please note  You were cared for by a hospitalist during your hospital stay. If you have any questions about your discharge medications or the care  you received while you were in the hospital after you are discharged, you can call the unit and asked to speak with the hospitalist on call if the hospitalist that took care of you is not available. Once you are discharged, your primary care physician will handle any further medical issues. Please note that NO REFILLS for any discharge medications will be authorized once you are discharged, as it is imperative that you return to your primary care physician (or establish a relationship with a primary care physician if you do not have one) for your aftercare needs so that they can reassess your need for medications and monitor your lab values. Today   SUBJECTIVE   Denies any chest pain. Patient says her brother passed away yesterday and she has to go home today anyhow since she has lot of stuff to take care of including her kids, foster kids and grandkids. Denies any chest pain. Uneventful night according to RN.  VITAL SIGNS:  Blood pressure (!) 107/97, pulse 87, temperature 98.6 F (37 C), temperature source Oral, resp. rate 15, height $RemoveBe'5\' 2"'ovVXUGgpi$  (1.575 m), weight 73 kg, SpO2 92 %.  I/O:    Intake/Output Summary (Last 24 hours) at 09/06/2020 1230 Last data filed at 09/06/2020 0217 Gross per 24 hour  Intake 141.34 ml  Output --  Net 141.34 ml    PHYSICAL EXAMINATION:  GENERAL:  60 y.o.-year-old patient  lying in the bed with no acute distress.   LUNGS: Normal breath sounds bilaterally, no wheezing, rales,rhonchi or crepitation. No use of accessory muscles of respiration.  CARDIOVASCULAR: S1, S2 normal. No murmurs, rubs, or gallops.  ABDOMEN: Soft, non-tender, non-distended. Bowel sounds present. No organomegaly or mass.  EXTREMITIES: No pedal edema, cyanosis, or clubbing.  NEUROLOGIC: Cranial nerves II through XII are intact. Muscle strength 5/5 in all extremities. Sensation intact. Gait not checked.  PSYCHIATRIC: The patient is alert and oriented x 3.  SKIN: No obvious rash, lesion, or ulcer.   DATA REVIEW:   CBC  Recent Labs  Lab 09/06/20 0742  WBC 13.5*  HGB 15.0  HCT 44.9  PLT 265    Chemistries  Recent Labs  Lab 09/04/20 2344 09/05/20 1312 09/06/20 0742  NA 132*   < > 133*  K 4.0   < > 3.8  CL 98   < > 103  CO2 20*   < > 19*  GLUCOSE 588*   < > 309*  BUN 20   < > 29*  CREATININE 1.03*   < > 0.79  CALCIUM 9.6   < > 8.8*  AST 13*  --   --   ALT 18  --   --   ALKPHOS 113  --   --   BILITOT 1.1  --   --    < > = values in this interval not displayed.    Microbiology Results   Recent Results (from the past 240 hour(s))  Resp Panel by RT-PCR (Flu A&B, Covid) Nasopharyngeal Swab     Status: None   Collection Time: 09/05/20 12:30 AM   Specimen: Nasopharyngeal Swab; Nasopharyngeal(NP) swabs in vial transport medium  Result Value Ref Range Status   SARS Coronavirus 2 by RT PCR NEGATIVE NEGATIVE Final    Comment: (NOTE) SARS-CoV-2 target nucleic acids are NOT DETECTED.  The SARS-CoV-2 RNA is generally detectable in upper respiratory specimens during the acute phase of infection. The lowest concentration of SARS-CoV-2 viral copies this assay can detect is 138 copies/mL. A negative result does  not preclude SARS-Cov-2 infection and should not be used as the sole basis for treatment or other patient management decisions. A negative result may occur with  improper  specimen collection/handling, submission of specimen other than nasopharyngeal swab, presence of viral mutation(s) within the areas targeted by this assay, and inadequate number of viral copies(<138 copies/mL). A negative result must be combined with clinical observations, patient history, and epidemiological information. The expected result is Negative.  Fact Sheet for Patients:  EntrepreneurPulse.com.au  Fact Sheet for Healthcare Providers:  IncredibleEmployment.be  This test is no t yet approved or cleared by the [place] FDA and  has been authorized for detection and/or diagnosis of SARS-CoV-2 by FDA under an Emergency Use Authorization (EUA). This EUA will remain  in effect (meaning this test can be used) for the duration of the COVID-19 declaration under Section 564(b)(1) of the Act, 21 U.S.C.section 360bbb-3(b)(1), unless the authorization is terminated  or revoked sooner.       Influenza A by PCR NEGATIVE NEGATIVE Final   Influenza B by PCR NEGATIVE NEGATIVE Final    Comment: (NOTE) The Xpert Xpress SARS-CoV-2/FLU/RSV plus assay is intended as an aid in the diagnosis of influenza from Nasopharyngeal swab specimens and should not be used as a sole basis for treatment. Nasal washings and aspirates are unacceptable for Xpert Xpress SARS-CoV-2/FLU/RSV testing.  Fact Sheet for Patients: EntrepreneurPulse.com.au  Fact Sheet for Healthcare Providers: IncredibleEmployment.be  This test is not yet approved or cleared by the [place] FDA and has been authorized for detection and/or diagnosis of SARS-CoV-2 by FDA under an Emergency Use Authorization (EUA). This EUA will remain in effect (meaning this test can be used) for the duration of the COVID-19 declaration under Section 564(b)(1) of the Act, 21 U.S.C. section 360bbb-3(b)(1), unless the authorization is terminated or revoked.  Performed at  [facility], [address] [place], [place] [address]   MRSA PCR Screening     Status: None   Collection Time: 09/05/20  5:55 AM   Specimen: Nasopharyngeal  Result Value Ref Range Status   MRSA by PCR NEGATIVE NEGATIVE Final    Comment:        The GeneXpert MRSA Assay (FDA approved for NASAL specimens only), is one component of a comprehensive MRSA colonization surveillance program. It is not intended to diagnose MRSA infection nor to guide or monitor treatment for MRSA infections. Performed at [facility], [address] [place], [place] [address]     RADIOLOGY:  DG Chest 2 View  Result Date: 09/04/2020 CLINICAL DATA:  Chest pain on the left for a few hours EXAM: CHEST - 2 VIEW COMPARISON:  04/09/2020 FINDINGS: Cardiac shadow is within normal limits. The lungs are well aerated bilaterally. No focal infiltrate or effusion is seen. Degenerative changes of the thoracic spine are noted. Stable postoperative changes in the right shoulder are noted. IMPRESSION: No acute abnormality noted. Electronically Signed   By: Inez Catalina M.D.   On: 09/04/2020 23:53   CARDIAC CATHETERIZATION  Addendum Date: 09/05/2020   Conclusions: 1. Severe single-vessel coronary artery disease with thrombotic 100% occlusion at the proximal margin of distal RCA stent with thrombus embolization into small RPL branches. 2. Mild proximal/mid RCA in-stent restenosis and stable sequential 50-80% RPDA lesions. 3. Nonobstructive CAD involving the left coronary artery, similar to prior catheterization in 03/2020. 4. Low normal left ventricular contraction with inferior hypokinesis (LVEF 50-55%) and moderately elevated filling pressure (LVEDP 25-30 mmHg). 5. Successful PCI to distal RCA  with placement of overlapping Resolute Onyx 3.0 x 18 mm (proximal) and 3.0 x 15 mm (distal) drug-eluting stents with 0% residual stenosis and TIMI-3 flow.  The majority of the RCA is now stented with the proximal and  distal segments having 2 layers. Recommendations: 1. Continue tirofiban infusion for 18 hours. 2. Dual antiplatelet therapy with aspirin and ticagrelor for at least 12 months, ideally longer given extensive stenting in the RCA. 3. Gentle diuresis; patient received furosemide 20 mg IV x1 at the end of the catheterization. 4. Aggressive secondary prevention including improved blood pressure and glycemic control, as well as tobacco/marijuana cessation. Nelva Bush, MD [facility]   Result Date: 09/05/2020 Conclusions: 1. Severe single-vessel coronary artery disease with thrombotic 100% occlusion at the proximal margin of distal RCA stent with thrombus embolization into small RPL branches. 2. Mild proximal/mid RCA in-stent restenosis and stable sequential 50-80% RPDA lesions. 3. Nonobstructive CAD involving the left coronary artery, similar to prior catheterization in 03/2020. 4. Low normal left ventricular contraction with inferior hypokinesis (LVEF 50-55%) and moderately elevated filling pressure (LVEDP 25-30 mmHg). 5. Successful PCI to distal RCA with placement of overlapping Resolute Onyx 3.0 x 18 mm (proximal) and 3.0 x 15 mm (distal) drug-eluting stents with 0% residual stenosis and TIMI-3 flow.  The majority of the RCA is now stented with the proximal and distal segments having 2 layers. Recommendations: 1. Continue tirofiban infusion for 18 hours. 2. Dual antiplatelet therapy with aspirin and ticagrelor for at least 12 months, ideally longer given extensive stenting in the RCA. 3. Gentle diuresis; patient received furosemide 20 mg IV x1 at the end of the catheterization. 4. Aggressive secondary prevention including improved blood pressure and glycemic control, as well as tobacco/marijuana cessation. Nelva Bush, MD [facility]     CODE STATUS:     Code Status Orders  (From admission, onward)         Start     Ordered   09/05/20 0551  Full code  Continuous        09/05/20 0551         Code Status History    Date Active Date Inactive Code Status Order ID Comments User Context   04/09/2020 2311 04/11/2020 2315 Full Code [id number]  Mansy, Arvella Merles, MD ED   07/24/2018 1934 07/25/2018 1803 Full Code [id number]  Saundra Shelling, MD ED   02/07/2018 0047 02/10/2018 2123 Full Code [id number]  Lance Coon, MD Inpatient   06/09/2017 1529 06/10/2017 1608 Full Code [id number]  Saunders Revel, Harrell Gave, MD Inpatient   02/19/2017 1204 02/19/2017 1651 Full Code [id number]  Minna Merritts, MD Inpatient   08/02/2016 0753 08/05/2016 1628 Full Code [id number]  Saundra Shelling, MD Inpatient   02/26/2015 1350 02/27/2015 1651 Full Code [id number]  Wellington Hampshire, MD Inpatient   02/23/2015 0819 02/26/2015 1350 Full Code [id number]  Harrie Foreman, MD Inpatient   06/15/2014 1229 06/16/2014 1754 Full Code [id number]  Jettie Booze, MD Inpatient   06/14/2014 0847 06/15/2014 1229 Full Code [id number]  Karen Kitchens Inpatient   06/14/2014 0725 06/14/2014 0847 Full Code [id number]  Louellen Molder, MD Inpatient   Advance Care Planning Activity       TOTAL TIME TAKING CARE OF THIS PATIENT: *40* minutes.    Fritzi Mandes M.D  [facility]    CC: Primary care physician; Mateo Flow, MD

## 2020-09-06 NOTE — Discharge Instructions (Signed)
Atherosclerosis  Atherosclerosis is narrowing and hardening of the arteries. Arteries are blood vessels that carry blood from the heart to all parts of the body. This blood contains oxygen. Arteries can become narrow or blocked from inflammation or from a buildup of fat, cholesterol, calcium, and other substances (plaque). Plaque decreases the amount of blood that can flow through the artery. Atherosclerosis can affect any artery in your body, including:  Heart arteries. Damage to these arteries may lead to coronary artery disease, which can cause a heart attack.  Brain arteries. Damage to these arteries may cause a stroke.  Leg, arm, and pelvis arteries. Peripheral artery disease (PAD) may result from damage to these arteries.  Kidney arteries. Kidney (renal) failure may result from damage to kidney arteries. Treatment may slow the disease and prevent further damage to your heart, brain, peripheral arteries, and kidneys. What are the causes? This condition develops slowly over many years. The inner layers of your arteries become damaged and allow the gradual buildup of plaque. The exact cause of atherosclerosis is not fully understood. Symptoms of atherosclerosis do not occur until an artery becomes narrow or blocked. What increases the risk? The following factors may make you more likely to develop this condition:  Being middle-aged or older.  Certain medical conditions, including: ? High blood pressure. ? High cholesterol. ? High blood fats (triglycerides). ? Diabetes. ? Sleep apnea.  A family history of atherosclerosis.  Being overweight.  Using products that contain tobacco or nicotine.  Not exercising enough (sedentary lifestyle).  Having a substance in your blood called C-reactive protein (CRP). This is a sign of increased levels of inflammation in your body.  Being stressed.  Drinking too much alcohol or using drugs, such as cocaine or methamphetamine. What are the  signs or symptoms? Symptoms of atherosclerosis may not be obvious until there is damage to an area of your body that is not getting enough blood. Sometimes, atherosclerosis does not cause symptoms. Symptoms of this condition include:  Coronary artery disease. This may cause chest pain and shortness of breath.  Decreased blood supply to your brain, which may cause a stroke. Signs of a stroke may include sudden: ? Weakness or numbness in your face, arm, or leg, especially on one side of your body. ? Trouble walking or difficulty moving your arms or legs. ? Loss of balance or coordination. ? Confusion. ? Slurred speech (dysarthria). ? Trouble speaking, or trouble understanding speech, or both (aphasia). ? Vision changes in one or both eyes. This may be double vision, blurred vision, or loss of vision. ? Severe headache with no known cause. The headache is often described as the worst headache ever experienced.  PAD, which may cause pain and numbness, often in your legs and hips.  Renal failure. This may cause tiredness, problems with urination, swelling, and itchy skin. How is this diagnosed? This condition is diagnosed based on your medical history and a physical exam. During the exam, your health care provider will:  Check your pulse in different places.  Listen for a "whooshing" sound over your arteries (bruit). You may also have tests, such as:  Blood tests to check your levels of cholesterol, triglycerides, and CRP.  Electrocardiogram (ECG) to check for heart damage.  Chest X-ray to see if you have an enlarged heart, which is a sign of heart failure.  Stress test to see how your heart reacts to exercise.  Echocardiogram to get images of the inside of your heart.  Ankle-brachial  index to compare blood pressure in your arms to blood pressure in your ankles.  Ultrasound of your peripheral arteries to check blood flow.  CT scan to check for damage to your heart or  brain.  X-rays of blood vessels after dye has been injected (angiogram) to check blood flow. How is this treated? This condition is treated with lifestyle changes as the first step. These may include:  Changing your diet.  Losing weight.  Reducing stress.  Exercising and being physically active more regularly.  Quitting smoking. You may also need medicine to:  Lower triglycerides and cholesterol.  Control blood pressure.  Prevent blood clots.  Lower inflammation in your body.  Control your blood sugar. Sometimes, surgery is needed to:  Remove plaque from an artery (endarterectomy).  Open or widen a narrowed heart artery (angioplasty).  Create a new path for your blood with one of these procedures: ? Heart (coronary) artery bypass graft surgery. ? Peripheral artery bypass graft surgery. Follow these instructions at home: Eating and drinking  Eat a heart-healthy diet. Talk with your health care provider or a diet and nutrition specialist (dietitian) if you need help. A heart-healthy diet involves: ? Limiting unhealthy fats and increasing healthy fats. Some examples of healthy fats are olive oil and canola oil. ? Eating plant-based foods, such as fruits, vegetables, nuts, whole grains, and legumes (such as peas and lentils).  If you drink alcohol: ? Limit how much you use to:  0-1 drink a day for nonpregnant women.  0-2 drinks a day for men. ? Be aware of how much alcohol is in your drink. In the [place] one drink equals one 12 oz bottle of beer (355 mL), one 5 oz glass of wine (148 mL), or one 1 oz glass of hard liquor (44 mL).   Lifestyle  Maintain a healthy weight. Lose weight if your health care provider says that you need to do that.  Follow an exercise program as told by your health care provider.  Do not use any products that contain nicotine or tobacco, such as cigarettes, e-cigarettes, and chewing tobacco. If you need help quitting, ask your health care  provider.  Do not use drugs.   General instructions  Take over-the-counter and prescription medicines only as told by your health care provider.  Manage other health conditions as told.  Keep all follow-up visits as told by your health care provider. This is important. Contact a health care provider if you have:  An irregular heartbeat.  Unexplained fatigue.  Trouble urinating, or you are producing less urine or foamy urine.  Swelling of your hands or feet, or itchy skin.  Unexplained pain or numbness in your legs or hips. Get help right away if:  You have any symptoms of a heart attack. These may be: ? Chest pain. This includes squeezing chest pain that may feel like indigestion (angina). ? Shortness of breath. ? Pain in your neck, jaw, arms, back, or stomach. ? Cold sweat. ? Nausea. ? Light-headedness.  You have any symptoms of a stroke. "BE FAST" is an easy way to remember the main warning signs of a stroke: ? B - Balance. Signs are dizziness, sudden trouble walking, or loss of balance. ? E - Eyes. Signs are trouble seeing or a sudden change in vision. ? F - Face. Signs are sudden weakness or numbness of the face, or the face or eyelid drooping on one side. ? A - Arms. Signs are weakness or numbness in an  arm. This happens suddenly and usually on one side of the body. ? S - Speech. Signs are sudden trouble speaking, slurred speech, or trouble understanding what people say. ? T - Time. Time to call emergency services. Write down what time symptoms started.  You have other signs of a stroke, such as: ? A sudden, severe headache with no known cause. ? Nausea or vomiting. ? Seizure. These symptoms may represent a serious problem that is an emergency. Do not wait to see if the symptoms will go away. Get medical help right away. Call your local emergency services (911 in the [place] Do not drive yourself to the hospital. Summary  Atherosclerosis is narrowing and hardening of  the arteries.  Arteries can become narrow from inflammation or from a buildup of fat, cholesterol, calcium, and other substances (plaque).  This condition may not cause any symptoms. If you do have symptoms, they are caused by damage to an area of your body that is not getting enough blood.  Treatment starts with lifestyle changes and may include medicines. In some cases, surgery is needed.  Get help right away if you have any symptoms of a heart attack or stroke. This information is not intended to replace advice given to you by your health care provider. Make sure you discuss any questions you have with your health care provider. Document Revised: 02/14/2019 Document Reviewed: 02/14/2019 Elsevier Patient Education  2021 [employer]   Coronary Microvascular Disease  Coronary microvascular disease (MVD), sometimes called small artery disease or small vessel disease, is heart disease in which the walls and inner lining of the heart's small arteries are damaged. The damage may lead to a tightening (spasm) of the arteries, which causes decreased blood flow to the heart. The tiny arteries of the heart may also have abnormalities that decrease the blood flow to the heart muscle.  MVD is different from traditional heart disease, or coronary artery disease (CAD), in which plaque builds up in the large arteries of the heart. The risk factors for MVD are similar to those for CAD, and microvascular disease is more common in women. What are the causes? This condition is caused by damage to the walls and inner lining of the small arteries of the heart. The exact cause of the damage is often not known. What increases the risk? Some things that may increase the risk for MVD include:  High blood pressure (hypertension).  Diabetes.  Smoking.  Very high cholesterol levels.  Being overweight or obese.  Inactivity (sedentary lifestyle).  An unhealthy diet.  A family history of early heart  disease.  Being a female. What are the signs or symptoms? Symptoms of this condition include:  Chest pain (angina). The pain can: ? Feel like a burning, squeezing, or pressure sensation. ? Spread (radiate) to the neck, arm, back, or jaw. ? Differ from other angina in these ways:  It usually lasts longer than 10 minutes and can last longer than 30 minutes.  It may occur along with shortness of breath, sleep problems, fatigue, and lack of energy.  It is usually first noticed during routine daily activities or times of mental stress.  Nausea.  Weakness.  Dizziness.  Sweating.  Fluttering or fast heartbeat (palpitations). How is this diagnosed? This condition may be diagnosed based on:  Your medical history, including evaluation for risk factors.  A physical exam.  Tests, such as: ? Blood tests. ? Electrocardiogram (ECG). This test checks the electrical activity in the heart. ?  MRI of the heart (cardiac MRI). This imaging test shows the function and structure of the heart and blood vessels (cardiovascular system). ? An exercise stress test. For this test, your heart function and blood pressure are monitored before, during, and after exercise. An ECG is used during this test. ? Coronary angiogram with additional blood flow testing. For this procedure, dye is injected into your coronary arteries before X-rays are taken to check blood flow and heart function. ? PET scan of the heart (cardiac PET scan). This imaging test makes pictures to show healthy and damaged heart muscle. Sometimes, standard tests do not show MVD. Standard coronary angiogram only shows blockages in the large arteries. You may still be diagnosed with MVD, even if the angiogram results are normal. How is this treated? This condition is managed with medicines, such as medicines to:  Control cholesterol levels (statin medicines).  Lower blood pressure (ACE inhibitors).  Prevent blood clots or control  inflammation.  Treat angina and improve blood flow to the heart (nitroglycerin).  Relax blood vessels (beta blockers or calcium channel blockers). Follow these instructions at home: Lifestyle  Be physically active every day. Ask your health care provider how much physical activity you need and what types of exercise are best for you.  Do not use any products that contain nicotine or tobacco. These products include cigarettes, chewing tobacco, and vaping devices, such as e-cigarettes. If you need help quitting, ask your health care provider.  Follow a heart-healthy diet. A dietitian can help you learn about healthy food options and changes. In general, eat plenty of fruits and vegetables, lean meats, and whole grains.   General instructions  Take over-the-counter and prescription medicines only as told by your health care provider.  Maintain a healthy weight or work with your health care provider to lose weight safely if needed.  Manage any other health conditions that affect your heart, such as hypertension and diabetes.  Keep all follow-up visits. This is important. Contact a health care provider if:  You have trouble doing your daily activities.  You feel nauseous.  You feel light-headed.  You have a fever or chills. Get help right away if:  You have discomfort in the center of your chest that: ? Lasts for more than a few minutes. ? Goes away and comes back (recurs).  You have shortness of breath.  You have pressure, pain, or a sense of fullness or squeezing in your chest. These symptoms may represent a serious problem that is an emergency. Do not wait to see if the symptoms will go away. Get medical help right away. Call your local emergency services (911 in the [place] Do not drive yourself to the hospital. Summary  Coronary microvascular disease (MVD) is heart disease in which the walls and inner lining of the heart's small arteries are damaged.  Symptoms of this  condition include chest pain (angina). This usually lasts at least 10 minutes and can last longer than 30 minutes.  This condition is diagnosed by your health care provider based on your medical history, a physical exam, and test results.  This condition is managed with medicines and other lifestyle changes. This information is not intended to replace advice given to you by your health care provider. Make sure you discuss any questions you have with your health care provider. Document Revised: 09/28/2019 Document Reviewed: 09/28/2019 Elsevier Patient Education  2021 [employer]   Heart Attack A heart attack occurs when blood and oxygen supply to the  heart is cut off. A heart attack causes damage to the heart that cannot be fixed. A heart attack is also called a myocardial infarction, or MI. If you think you are having a heart attack, do not wait to see if the symptoms will go away. Get medical help right away. What are the causes? This condition may be caused by:  A fatty substance (plaque) in the blood vessels (arteries). This can block the flow of blood to the heart.  A blood clot in the blood vessels that go to the heart. The blood clot blocks blood flow.  Low blood pressure.  An abnormal heartbeat.  Some diseases, such as problems in red blood cells (anemia)orproblems in breathing (respiratory failure).  Tightening (spasm) of a blood vessel that cuts off blood to the heart.  A tear in a blood vessel of the heart.  High blood pressure.   What increases the risk? The following factors may make you more likely to develop this condition:  Aging. The older you are, the higher your risk.  Having a personal or family history of chest pain, heart attack, stroke, or narrowing of the arteries in the legs, arms, head, or stomach (peripheral artery disease).  Being female.  Smoking.  Not getting regular exercise.  Being overweight or obese.  Having high blood pressure.  Having  high cholesterol.  Having diabetes.  Drinking too much alcohol.  Using illegal drugs, such as cocaine or methamphetamine. What are the signs or symptoms? Symptoms of this condition include:  Chest pain. It may feel like: ? Crushing or squeezing. ? Tightness, pressure, fullness, or heaviness.  Pain in the arm, neck, jaw, back, or upper body.  Shortness of breath.  Heartburn.  Upset stomach (indigestion).  Feeling like you may vomit (nauseous).  Cold sweats.  Feeling tired.  Sudden light-headedness. How is this treated? A heart attack must be treated as soon as possible. Treatment may include:  Medicines to: ? Break up or dissolve blood clots. ? Thin blood and help prevent blood clots. ? Treat blood pressure. ? Improve blood flow to the heart. ? Reduce pain. ? Reduce cholesterol.  Procedures to widen a blocked artery and keep it open.  Open heart surgery.  Receiving oxygen.  Making your heart strong again (cardiac rehabilitation) through exercise, education, and counseling.   Follow these instructions at home: Medicines  Take over-the-counter and prescription medicines only as told by your doctor. You may need to take medicine: ? To keep your blood from clotting too easily. ? To control blood pressure. ? To lower cholesterol. ? To control heart rhythms.  Do not take these medicines unless your doctor says it is okay: ? NSAIDs, such as ibuprofen. ? Supplements that have vitamin A, vitamin E, or both. ? Hormone replacement therapy that has estrogen with or without progestin. Lifestyle  Do not use any products that have nicotine or tobacco, such as cigarettes, e-cigarettes, and chewing tobacco. If you need help quitting, ask your doctor.  Avoid secondhand smoke.  Exercise regularly. Ask your doctor about a cardiac rehab program.  Eat heart-healthy foods. Your doctor will tell you what foods to eat.  Stay at a healthy weight.  Lower your stress  level.  Do not use illegal drugs.      Alcohol use  Do not drink alcohol if: ? Your doctor tells you not to drink. ? You are pregnant, may be pregnant, or are planning to become pregnant.  If you drink alcohol: ? Limit  how much you use to:  0-1 drink a day for women.  0-2 drinks a day for men. ? Know how much alcohol is in your drink. In the [place] one drink equals one 12 oz bottle of beer (355 mL), one 5 oz glass of wine (148 mL), or one 1 oz glass of hard liquor (44 mL). General instructions  Work with your doctor to treat other problems you may have, such as diabetes or high blood pressure.  Get screened for depression. Get treatment if needed.  Keep your vaccines up to date. Get the flu shot (influenza vaccine) every year.  Keep all follow-up visits as told by your doctor. This is important. Contact a doctor if:  You feel very sad.  You have trouble doing your daily activities. Get help right away if:  You have sudden, unexplained discomfort in your chest, arms, back, neck, jaw, or upper body.  You have shortness of breath.  You have sudden sweating or clammy skin.  You feel like you may vomit.  You vomit.  You feel tired or weak.  You get light-headed or dizzy.  You feel your heart beating fast.  You feel your heart skipping beats.  You have blood pressure that is higher than 180/120. These symptoms may be an emergency. Do not wait to see if the symptoms will go away. Get medical help right away. Call your local emergency services (911 in the [place] Do not drive yourself to the hospital. Summary  A heart attack occurs when blood and oxygen supply to the heart is cut off.  Do not take NSAIDs unless your doctor says it is okay.  Do not smoke. Avoid secondhand smoke.  Exercise regularly. Ask your doctor about a cardiac rehab program. This information is not intended to replace advice given to you by your health care provider. Make sure you discuss any  questions you have with your health care provider. Document Revised: 07/19/2018 Document Reviewed: 07/19/2018 Elsevier Patient Education  2021 [employer]   Coronary Artery Disease, Female Coronary artery disease (CAD) is a condition in which the arteries that lead to the heart (coronary arteries) become narrow or blocked. The narrowing or blockage can lead to decreased blood flow to the heart. Prolonged reduced blood flow can cause a heart attack (myocardial infarction or MI). This condition may also be called coronary heart disease. Because CAD is the leading cause of death in women, it is important to understand what causes this condition and how it is treated. What are the causes? CAD is most often caused by atherosclerosis. This is the buildup of fat and cholesterol (plaque) on the inside of the arteries. Over time, the plaque may narrow or block the artery, reducing blood flow to the heart. Plaque can also become weak and break off within a coronary artery and cause a sudden blockage. Other less common causes of CAD include:  A blood clot or a piece of a blood clot or other substance that blocks the flow of blood in a coronary artery (embolism).  A tearing of the artery (spontaneous coronary artery dissection).  An enlargement of an artery (aneurysm).  Inflammation (vasculitis) in the artery wall.   What increases the risk? The following factors may make you more likely to develop this condition:  Age. Women over age 48 are at a greater risk of CAD.  Family history of CAD.  High blood pressure (hypertension).  Diabetes.  High cholesterol levels.  Tobacco use.  Lack of exercise.  Menopause. ? All postmenopausal women are at greater risk of CAD. ? Women who have experienced menopause between the ages of 67-45 (early menopause) are at a higher risk of CAD. ? Women who have experienced menopause before age 81 (premature menopause) are at a very high risk of  CAD.  Excessive alcohol use.  A diet high in saturated and trans fats, such as fried food and processed meat. Other possible risk factors include:  High stress levels.  Depression.  Obesity.  Sleep apnea. What are the signs or symptoms? Many people do not have any symptoms during the early stages of CAD. As the condition progresses, symptoms may include:  Chest pain (angina). The pain can: ? Feel like crushing or squeezing, or like a tightness, pressure, fullness, or heaviness in the chest. ? Last more than a few minutes or can stop and recur. The pain tends to get worse with exercise or stress and to fade with rest.  Pain in the arms, neck, jaw, ear, or back.  Unexplained heartburn or indigestion.  Shortness of breath.  Nausea.  Sudden cold sweats.  Sudden light-headedness.  Fluttering or fast heartbeat (palpitations). Many women have chest discomfort and the other symptoms. However, women often have unusual (atypical) symptoms, such as:  Fatigue.  Vomiting.  Unexplained feelings of nervousness or anxiety.  Unexplained weakness.  Dizziness or fainting. How is this diagnosed? This condition is diagnosed based on:  Your family and medical history.  A physical exam.  Tests, including: ? A test to check the electrical signals in your heart (electrocardiogram). ? Exercise stress test. This looks for signs of blockage when the heart is stressed with exercise, such as running on a treadmill. ? Pharmacologic stress test. This test looks for signs of blockage when the heart is being stressed with a medicine. ? Blood tests. ? Coronary angiogram. This is a procedure to look at the coronary arteries to see if there is any blockage. During this test, a dye is injected into your arteries so they appear on an X-ray. ? Coronary artery CT scan. This CT scan helps detect calcium deposits in your coronary arteries. Calcium deposits are an indicator of CAD. ? A test that uses  sound waves to take a picture of your heart (echocardiogram). ? Chest X-ray. How is this treated? This condition may be treated by:  Healthy lifestyle changes to reduce risk factors.  Medicines such as: ? Antiplatelet medicines and blood-thinning medicines, such as aspirin. These help to prevent blood clots. ? Nitroglycerin. ? Blood pressure medicines. ? Cholesterol-lowering medicine.  Coronary angioplasty and stenting. During this procedure, a thin, flexible tube is inserted through a blood vessel and into a blocked artery. A balloon or similar device on the end of the tube is inflated to open up the artery. In some cases, a small, mesh tube (stent) is inserted into the artery to keep it open.  Coronary artery bypass surgery. During this surgery, veins or arteries from other parts of the body are used to create a bypass around the blockage and allow blood to reach your heart. Follow these instructions at home: Medicines  Take over-the-counter and prescription medicines only as told by your health care provider.  Do not take the following medicines unless your health care provider approves: ? NSAIDs, such as ibuprofen, naproxen, or celecoxib. ? Vitamin supplements that contain vitamin A, vitamin E, or both. ? Hormone replacement therapy that contains estrogen with or without progestin. Lifestyle  Follow an exercise program approved  by your health care provider. Aim for 150 minutes of moderate exercise or 75 minutes of vigorous exercise each week.  Maintain a healthy weight or lose weight as approved by your health care provider.  Learn to manage stress or try to limit your stress. Ask your health care provider for suggestions if you need help.  Get screened for depression and seek treatment, if needed.  Do not use any products that contain nicotine or tobacco, such as cigarettes, e-cigarettes, and chewing tobacco. If you need help quitting, ask your health care provider.  Do not  use illegal drugs.  Eating and drinking  Follow a heart-healthy diet. A dietitian can help educate you about healthy food options and changes. In general, eat plenty of fruits and vegetables, lean meats, and whole grains.  Avoid foods high in: ? Sugar. ? Salt (sodium). ? Saturated fats, such as processed or fatty meat. ? Trans fats, such as fried food.  Use healthy cooking methods such as roasting, grilling, broiling, baking, poaching, steaming, or stir-frying.  Do not drink alcohol if: ? Your health care provider tells you not to drink. ? You are pregnant, may be pregnant, or are planning to become pregnant.  If you drink alcohol: ? Limit how much you have to 0-1 drink a day. ? Be aware of how much alcohol is in your drink. In the [place] one drink equals one 12 oz bottle of beer (355 mL), one 5 oz glass of wine (148 mL), or one 1 oz glass of hard liquor (44 mL).   General instructions  Manage any other health conditions, such as hypertension and diabetes. These conditions affect your heart.  Your health care provider may ask you to monitor your blood pressure. Ideally, your blood pressure should be below 130/80.  Keep all follow-up visits as told by your health care provider. This is important. Get help right away if:  You have pain in your chest, neck, ear, arm, jaw, stomach, or back that: ? Lasts more than a few minutes. ? Is recurring. ? Is not relieved by taking medicine under your tongue (sublingual nitroglycerin).  You have profuse sweating without cause.  You have unexplained: ? Heartburn or indigestion. ? Shortness of breath or difficulty breathing. ? Fluttering or fast heartbeat (palpitations). ? Nausea or vomiting. ? Fatigue. ? Feelings of nervousness or anxiety. ? Weakness. ? Diarrhea.  You have sudden light-headedness or dizziness.  You faint.  You feel like hurting yourself or think about taking your own life. These symptoms may represent a serious  problem that is an emergency. Do not wait to see if the symptoms will go away. Get medical help right away. Call your local emergency services (911 in the [place] Do not drive yourself to the hospital. Summary  Coronary artery disease (CAD) is a condition in which the arteries that lead to the heart (coronary arteries) become narrow or blocked. The narrowing or blockage can lead to a heart attack.  Many women have chest discomfort and other common symptoms of CAD. However, women often have unusual (atypical) symptoms, such as fatigue, vomiting, weakness, or dizziness.  CAD can be treated with lifestyle changes, medicines, surgery, or a combination of these treatments. This information is not intended to replace advice given to you by your health care provider. Make sure you discuss any questions you have with your health care provider. Document Revised: 12/25/2017 Document Reviewed: 12/15/2017 Elsevier Patient Education  2021 Elsevier Inc.   Blood Pressure Record Sheet To take  your blood pressure, you will need a blood pressure machine. You can buy a blood pressure machine (blood pressure monitor) at your clinic, drug store, or online. When choosing one, consider:  An automatic monitor that has an arm cuff.  A cuff that wraps snugly around your upper arm. You should be able to fit only one finger between your arm and the cuff.  A device that stores blood pressure reading results.  Do not choose a monitor that measures your blood pressure from your wrist or finger. Follow your health care provider's instructions for how to take your blood pressure. To use this form:  Get one reading in the morning (a.m.) before you take any medicines.  Get one reading in the evening (p.m.) before supper.  Take at least 2 readings with each blood pressure check. This makes sure the results are correct. Wait 1-2 minutes between measurements.  Write down the results in the spaces on this form.  Repeat  this once a week, or as told by your health care provider.  Make a follow-up appointment with your health care provider to discuss the results. Blood pressure log Date: _______________________  a.m. _____________________(1st reading) _____________________(2nd reading)  p.m. _____________________(1st reading) _____________________(2nd reading) Date: _______________________  a.m. _____________________(1st reading) _____________________(2nd reading)  p.m. _____________________(1st reading) _____________________(2nd reading) Date: _______________________  a.m. _____________________(1st reading) _____________________(2nd reading)  p.m. _____________________(1st reading) _____________________(2nd reading) Date: _______________________  a.m. _____________________(1st reading) _____________________(2nd reading)  p.m. _____________________(1st reading) _____________________(2nd reading) Date: _______________________  a.m. _____________________(1st reading) _____________________(2nd reading)  p.m. _____________________(1st reading) _____________________(2nd reading) This information is not intended to replace advice given to you by your health care provider. Make sure you discuss any questions you have with your health care provider. Document Revised: 07/27/2019 Document Reviewed: 07/27/2019 Elsevier Patient Education  2021 Elsevier Inc.   Heart Attack The heart is a muscle that needs oxygen to survive. A heart attack is a condition that occurs when your heart does not get enough oxygen. When this happens, the heart muscle begins to die. This can cause permanent damage if not treated right away. A heart attack is a medical emergency. This condition may be called a myocardial infarction, or MI. It is also known as acute coronary syndrome (ACS). ACS is a term used to describe a group of conditions that affect blood flow to the heart. What are the causes? This condition may be caused  by:  Atherosclerosis. This occurs when a fatty substance called plaque builds up in the arteries and blocks or reduces blood supply to the heart.  A blood clot. A blood clot can develop suddenly when plaque breaks up within an artery and blocks blood flow to the heart.  Low blood pressure.  An abnormal heartbeat (arrhythmia).  Conditions that cause a decrease of oxygen to the heart, such as anemiaorrespiratory failure.  A spasm, or severe tightening, of a blood vessel that cuts off blood flow to the heart.  Tearing of a coronary artery (spontaneous coronary artery dissection).  High blood pressure.   What increases the risk? The following factors may make you more likely to develop this condition:  Aging. The older you are, the higher your risk.  Having a personal or family history of chest pain, heart attack, stroke, or narrowing of the arteries in the legs, arms, head, or stomach (peripheral artery disease).  Being female.  Smoking.  Not getting regular exercise.  Being overweight or obese.  Having high blood pressure.  Having high cholesterol (hypercholesterolemia).  Having diabetes.  Drinking too much alcohol.  Using illegal drugs, such as cocaine or methamphetamine. What are the signs or symptoms? Symptoms of this condition may vary, depending on factors like gender and age. Symptoms may include:  Chest pain. It may feel like: ? Crushing or squeezing. ? Tightness, pressure, fullness, or heaviness.  Pain in the arm, neck, jaw, back, or upper body.  Shortness of breath.  Heartburn or upset stomach.  Nausea.  Sudden cold sweats.  Feeling tired.  Sudden light-headedness. How is this diagnosed? This condition may be diagnosed through tests, such as:  Electrocardiogram (ECG) to measure the electrical activity of your heart.  Blood tests to check for cardiac markers. These chemicals are released by a damaged heart muscle.  A test to evaluate blood flow  and heart function (coronary angiogram).  CT scan to see the heart more clearly.  A test to evaluate the pumping action of the heart (echocardiogram). How is this treated? A heart attack must be treated as soon as possible. Treatment may include:  Medicines to: ? Break up or dissolve blood clots (fibrinolytic therapy). ? Thin blood and help prevent blood clots. ? Treat blood pressure. ? Improve blood flow to the heart. ? Reduce pain. ? Reduce cholesterol.  Angioplasty and stent placement. These are procedures to widen a blocked artery and keep it open.  Coronary artery bypass graft, CABG, or open heart surgery. This enables blood to flow to the heart by going around the blocked part of the artery.  Oxygen therapy if needed.  Cardiac rehabilitation. This improves your health and well-being through exercise, education, and counseling.   Follow these instructions at home: Medicines  Take over-the-counter and prescription medicines only as told by your health care provider.  Do not take the following medicines unless your health care provider says it is okay to take them: ? NSAIDs, such as ibuprofen. ? Supplements that contain vitamin A, vitamin E, or both. ? Hormone replacement therapy that contains estrogen with or without progestin. Lifestyle  Do not use any products that contain nicotine or tobacco, such as cigarettes, e-cigarettes, and chewing tobacco. If you need help quitting, ask your health care provider.  Avoid secondhand smoke.  Exercise regularly. Ask your health care provider about participating in a cardiac rehabilitation program that helps you start exercising safely after a heart attack.  Eat a heart-healthy diet. Your health care provider will tell you what foods to eat.  Maintain a healthy weight.  Learn ways to manage stress.  Do not use illegal drugs.   Alcohol use  Do not drink alcohol if: ? Your health care provider tells you not to drink. ? You  are pregnant, may be pregnant, or are planning to become pregnant.  If you drink alcohol: ? Limit how much you use to:  0-1 drink a day for women.  0-2 drinks a day for men. ? Be aware of how much alcohol is in your drink. In the [place] one drink equals one 12 oz bottle of beer (355 mL), one 5 oz glass of wine (148 mL), or one 1 oz glass of hard liquor (44 mL). General instructions  Work with your health care provider to manage any other conditions you have, such as high blood pressure or diabetes. These conditions affect your heart.  Get screened for depression, and seek treatment if needed.  Keep your vaccinations up to date. Get the flu vaccine every year.  Keep all follow-up visits as told by your health care provider.  This is important. Contact a health care provider if:  You feel overwhelmed or sad.  You have trouble doing your daily activities. Get help right away if:  You have sudden, unexplained discomfort in your chest, arms, back, neck, jaw, or upper body.  You have shortness of breath.  You suddenly start to sweat or your skin gets clammy.  You feel nauseous or you vomit.  You have unexplained tiredness or weakness.  You suddenly feel light-headed or dizzy.  You notice your heart starts to beat fast or feels like it is skipping beats.  You have blood pressure that is higher than 180/120. These symptoms may represent a serious problem that is an emergency. Do not wait to see if the symptoms will go away. Get medical help right away. Call your local emergency services (911 in the [place] Do not drive yourself to the hospital. Summary  A heart attack, also called myocardial infarction, is a condition that occurs when your heart does not get enough oxygen. This is caused by anything that blocks or reduces blood flow to the heart.  Treatment is a combination of medicines and surgeries, if needed, to open the blocked arteries and restore blood flow to the heart.  A  heart attack is an emergency. Get help right away if you have sudden discomfort in your chest, arms, back, neck, jaw, or upper body. Seek help if you feel nauseous, you vomit, or you feel light-headed or dizzy. This information is not intended to replace advice given to you by your health care provider. Make sure you discuss any questions you have with your health care provider. Document Revised: 07/15/2018 Document Reviewed: 07/19/2018 Elsevier Patient Education  2021 Elsevier Inc.  Patient advised smoking cessation Recommended to take her insulin as instructed. Patient advised carb control heart healthy diet. She is recommended to set up appointment with her PCP at [facility] clinic as soon as possible. Patient also recommended to follow-up [facility] mental health. Follow-up with Dr. Mariah Milling cardiology in one week

## 2020-09-06 NOTE — Progress Notes (Signed)
Inpatient Diabetes Program Recommendations  AACE/ADA: New Consensus Statement on Inpatient Glycemic Control (2015)  Target Ranges:  Prepandial:   less than 140 mg/dL      Peak postprandial:   less than 180 mg/dL (1-2 hours)      Critically ill patients:  140 - 180 mg/dL   Lab Results  Component Value Date   GLUCAP 267 (H) 09/06/2020   HGBA1C 14.3 (H) 09/05/2020    Review of Glycemic Control Results for PATTIE, FLAHARTY (MRN [id number]) as of 09/06/2020 09:21  Ref. Range 09/05/2020 21:18 09/05/2020 22:29 09/06/2020 07:15  Glucose-Capillary Latest Ref Range: 70 - 99 mg/dL 287 (H) 681 (H) 157 (H)   Diabetes history: DM 2 Outpatient Diabetes medications: Tresiba 78 units QHS, Victoza 1.8 mg daily, Metformin   Current orders for Inpatient glycemic control: Lantus 10 units daily, Novolog 5 units TID with meals, Novolog 0-15 units TID with meals, Novolog 0-5 units QHS Inpatient Diabetes Program Recommendations:    Please consider increasing Lantus to 40 units daily.    Thanks,  Beryl Meager, RN, BC-ADM Inpatient Diabetes Coordinator Pager [phone] (8a-5p)

## 2020-09-06 NOTE — Progress Notes (Signed)
*  PRELIMINARY RESULTS* Echocardiogram 2D Echocardiogram has been performed.  Jennifer [name] 09/06/2020, 9:59 AM

## 2020-09-06 NOTE — Progress Notes (Signed)
Progress Note  Patient Name: Jennifer [name] Date of Encounter: 09/06/2020  [facility] Cardiologist: Julien Nordmann, MD   Subjective   Discussed recent cardiac catheterization results and intervention with her " I need to leave today" Reports having significant stressors, family relies on her for everything, including per the patient decision-making, driving, shopping etc. She reports feeling well with no chest pain but just very anxious and the recent loss of her brother of unclear etiology Requesting Xanax for anxiety. "  I am not set up with mental health at this time"  Inpatient Medications    Scheduled Meds: . aspirin EC  81 mg Oral Daily  . atorvastatin  80 mg Oral Daily  . Chlorhexidine Gluconate Cloth  6 each Topical Q0600  . enoxaparin (LOVENOX) injection  40 mg Subcutaneous Q24H  . gabapentin  600 mg Oral Daily  . insulin aspart  0-20 Units Subcutaneous TID AC & HS  . insulin aspart  5 Units Subcutaneous TID WC  . insulin glargine  40 Units Subcutaneous Daily  . lisinopril  20 mg Oral Daily  . metoprolol succinate  50 mg Oral BID WC  . nicotine  21 mg Transdermal Daily  . QUEtiapine  200 mg Oral QHS  . ranolazine  500 mg Oral BID  . sodium chloride flush  3 mL Intravenous Q12H  . ticagrelor  90 mg Oral BID   Continuous Infusions: . sodium chloride     PRN Meds: sodium chloride, acetaminophen, albuterol, ALPRAZolam, HYDROcodone-acetaminophen, nitroGLYCERIN, ondansetron (ZOFRAN) IV, sodium chloride flush   Vital Signs    Vitals:   09/06/20 0500 09/06/20 0600 09/06/20 0800 09/06/20 1000  BP: 96/71 114/74 (!) 109/58 (!) 107/97  Pulse: 74 78 81 87  Resp: 13 14 (!) 21 15  Temp:   98.6 F (37 C)   TempSrc:   Oral   SpO2: 91% 92% 95% 92%  Weight:      Height:        Intake/Output Summary (Last 24 hours) at 09/06/2020 1126 Last data filed at 09/06/2020 0217 Gross per 24 hour  Intake 141.34 ml  Output --  Net 141.34 ml   Last 3 Weights 09/04/2020  07/27/2020 07/27/2020  Weight (lbs) 161 lb 161 lb 161 lb  Weight (kg) 73.029 kg 73.029 kg 73.029 kg  Some encounter information is confidential and restricted. Go to Review Flowsheets activity to see all data.      Telemetry    Normal sinus rhythm- Personally Reviewed  ECG     - Personally Reviewed  Physical Exam   GEN: No acute distress.   Neck: No JVD Cardiac: RRR, no murmurs, rubs, or gallops.  Respiratory: Clear to auscultation bilaterally. GI: Soft, nontender, non-distended  MS: No edema; No deformity. Neuro:  Nonfocal  Psych: Normal affect   Labs    High Sensitivity Troponin:   Recent Labs  Lab 09/04/20 2344 09/05/20 0156 09/05/20 0618 09/05/20 1312  TROPONINIHS 27* 154* >27,000* >27,000*      Chemistry Recent Labs  Lab 09/04/20 2344 09/05/20 1312 09/06/20 0742  NA 132* 134* 133*  K 4.0 3.4* 3.8  CL 98 104 103  CO2 20* 20* 19*  GLUCOSE 588* 200* 309*  BUN 20 23* 29*  CREATININE 1.03* 0.74 0.79  CALCIUM 9.6 9.3 8.8*  PROT 7.9  --   --   ALBUMIN 4.9  --   --   AST 13*  --   --   ALT 18  --   --  ALKPHOS 113  --   --   BILITOT 1.1  --   --   GFRNONAA >60 >60 >60  ANIONGAP 14 10 11      Hematology Recent Labs  Lab 09/04/20 2344 09/05/20 1312 09/06/20 0742  WBC 17.5* 14.2* 13.5*  RBC 5.10 4.99 4.88  HGB 16.0* 15.6* 15.0  HCT 47.5* 45.4 44.9  MCV 93.1 91.0 92.0  MCH 31.4 31.3 30.7  MCHC 33.7 34.4 33.4  RDW 13.3 13.4 13.7  PLT 297 294 265    BNPNo results for input(s): BNP, PROBNP in the last 168 hours.   DDimer No results for input(s): DDIMER in the last 168 hours.   Radiology    DG Chest 2 View  Result Date: 09/04/2020 CLINICAL DATA:  Chest pain on the left for a few hours EXAM: CHEST - 2 VIEW COMPARISON:  04/09/2020 FINDINGS: Cardiac shadow is within normal limits. The lungs are well aerated bilaterally. No focal infiltrate or effusion is seen. Degenerative changes of the thoracic spine are noted. Stable postoperative changes in the  right shoulder are noted. IMPRESSION: No acute abnormality noted. Electronically Signed   By: 04/11/2020 M.D.   On: 09/04/2020 23:53   CARDIAC CATHETERIZATION  Addendum Date: 09/05/2020   Conclusions: 1. Severe single-vessel coronary artery disease with thrombotic 100% occlusion at the proximal margin of distal RCA stent with thrombus embolization into small RPL branches. 2. Mild proximal/mid RCA in-stent restenosis and stable sequential 50-80% RPDA lesions. 3. Nonobstructive CAD involving the left coronary artery, similar to prior catheterization in 03/2020. 4. Low normal left ventricular contraction with inferior hypokinesis (LVEF 50-55%) and moderately elevated filling pressure (LVEDP 25-30 mmHg). 5. Successful PCI to distal RCA with placement of overlapping Resolute Onyx 3.0 x 18 mm (proximal) and 3.0 x 15 mm (distal) drug-eluting stents with 0% residual stenosis and TIMI-3 flow.  The majority of the RCA is now stented with the proximal and distal segments having 2 layers. Recommendations: 1. Continue tirofiban infusion for 18 hours. 2. Dual antiplatelet therapy with aspirin and ticagrelor for at least 12 months, ideally longer given extensive stenting in the RCA. 3. Gentle diuresis; patient received furosemide 20 mg IV x1 at the end of the catheterization. 4. Aggressive secondary prevention including improved blood pressure and glycemic control, as well as tobacco/marijuana cessation. 04/2020, MD [facility]   Result Date: 09/05/2020 Conclusions: 1. Severe single-vessel coronary artery disease with thrombotic 100% occlusion at the proximal margin of distal RCA stent with thrombus embolization into small RPL branches. 2. Mild proximal/mid RCA in-stent restenosis and stable sequential 50-80% RPDA lesions. 3. Nonobstructive CAD involving the left coronary artery, similar to prior catheterization in 03/2020. 4. Low normal left ventricular contraction with inferior hypokinesis (LVEF 50-55%) and  moderately elevated filling pressure (LVEDP 25-30 mmHg). 5. Successful PCI to distal RCA with placement of overlapping Resolute Onyx 3.0 x 18 mm (proximal) and 3.0 x 15 mm (distal) drug-eluting stents with 0% residual stenosis and TIMI-3 flow.  The majority of the RCA is now stented with the proximal and distal segments having 2 layers. Recommendations: 1. Continue tirofiban infusion for 18 hours. 2. Dual antiplatelet therapy with aspirin and ticagrelor for at least 12 months, ideally longer given extensive stenting in the RCA. 3. Gentle diuresis; patient received furosemide 20 mg IV x1 at the end of the catheterization. 4. Aggressive secondary prevention including improved blood pressure and glycemic control, as well as tobacco/marijuana cessation. 04/2020, MD [facility]    Cardiac Studies  Echocardiogram pending  Patient Profile     Jennifer [name] is a 60 y.o. female with a hx of coronary artery disease status post multiple PCI's to the RCA, chronic HFpEF, hypertension, hyperlipidemia, type 2 diabetes mellitus, PAD, obstructive sleep apnea, polysubstance abuse (tobacco and marijuana), recurrent nephrolithiasis, chronic pain, depression, and anxiety who is being seen 09/05/2020 for the evaluation of chest pain and abnormal EKG  Had catheterization, stenting to her distal RCA  Assessment & Plan    Inferior STEMI: Presenting with acute onset of chest pain  PCI to occluded distal RCA.  , Chest pain significantly improved  -Echocardiogram pending -Stressed need to stay on her low-dose aspirin with Brilinta, medication compliance recommended Reports that she has not taken her medications for the past week secondary to " stress" -Smoking cessation recommended, high intensity Lipitor Close follow-up in clinic  Ischemic cardiomyopathy: LVEF low normal with inferior hypokinesis on ventriculogram during  Catheterization. Echocardiogram pending Recommend we continue metoprolol succinate  and lisinopril.  Hypertension: Has not been on medications at least the past week or more Stressed importance of taking her medications Pressure much improved on today's visit/rounding  Hyperlipidemia: Target LDL less than 70, continue Lipitor 80  Uncontrolled type 2 diabetes mellitus: Glucose noted to be greater than 500 on admission and immediately after catheterization. -Medication noncompliance at home, stressed compliance with medications  Polysubstance abuse: Patient continues to smoke tobacco and marijuana.  Long discussion concerning her risk factors as detailed above including hyperlipidemia, smoking, poorly controlled diabetes Stressed importance of taking her antiplatelet therapy and risk of stent thrombosis if she does not take her medications Pharmacy to assist with getting medications, Case discussed with hospitalist service  Total encounter time more than 35 minutes  Greater than 50% was spent in counseling and coordination of care with the patient      For questions or updates, please contact [facility] Please consult [email or web address] for contact info under        Signed, Julien Nordmann, MD  09/06/2020, 11:26 AM

## 2020-09-06 NOTE — Plan of Care (Signed)
Neuro: stable at baseline Resp: stable on room air CV: afebrile, vital signs stable GIGU: using toilet in room easily, tolerating PO well, no emesis, BM today Skin: clean dry and intact Social: Communicating with family via personal telephone, all patient's questions and concerns addressed.  Events: D/C home despite advising patient she should not leave this soon after her cardiac event and intervention. Patient insists she needs to go home as she has a significant amount of work to do surrounding caring for her family and dealing with the death of her brother.  Upon distribution of discharge paperwork, patient became upset due to no one making her mental health or primary care appointments.  Previously, the patient stated she did not have a primary care doctor despite [facility] being listed on her insurance information.  This RN encouraged the patient to take responsibility to better her health and that she needed to make the appointments herself since we did not have her provider information on file.  She also expressed she was unhappy with the date and time her cardiology appointment was scheduled for.  This RN explained that when she calls to reschedule her cardiology appointment she could cancel the one we scheduled for her.  This RN reinforced that the patient has the power to better her health in order to extend her life and eliminate health issues she currently suffers from; disease literature and verbal guidance, such as quit smoking, eat a more balanced diet, be more active and decrease stress, were provided.  Patient mocked the discharge instructions stating, "yeah yeah, quit smoking, eat better, and exercise [then I'll get better]."   Problem: Education: Goal: Knowledge of General Education information will improve Description: Including pain rating scale, medication(s)/side effects and non-pharmacologic comfort measures Outcome: Adequate for Discharge   Problem: Health  Behavior/Discharge Planning: Goal: Ability to manage health-related needs will improve Outcome: Adequate for Discharge   Problem: Clinical Measurements: Goal: Ability to maintain clinical measurements within normal limits will improve Outcome: Adequate for Discharge Goal: Will remain free from infection Outcome: Adequate for Discharge Goal: Diagnostic test results will improve Outcome: Adequate for Discharge Goal: Respiratory complications will improve Outcome: Adequate for Discharge Goal: Cardiovascular complication will be avoided Outcome: Adequate for Discharge   Problem: Activity: Goal: Risk for activity intolerance will decrease Outcome: Adequate for Discharge   Problem: Nutrition: Goal: Adequate nutrition will be maintained Outcome: Adequate for Discharge   Problem: Coping: Goal: Level of anxiety will decrease Outcome: Adequate for Discharge   Problem: Elimination: Goal: Will not experience complications related to bowel motility Outcome: Adequate for Discharge Goal: Will not experience complications related to urinary retention Outcome: Adequate for Discharge   Problem: Pain Managment: Goal: General experience of comfort will improve Outcome: Adequate for Discharge   Problem: Safety: Goal: Ability to remain free from injury will improve Outcome: Adequate for Discharge   Problem: Skin Integrity: Goal: Risk for impaired skin integrity will decrease Outcome: Adequate for Discharge

## 2020-09-06 NOTE — Plan of Care (Signed)
Nutrition Education Note  RD consulted for nutrition education regarding heart health and DM.  60 y.o. female with medical history significant of CAD, non-STEMI, stent placement, hypertension, hyperlipidemia, diabetes mellitus depression, anxiety, tobacco abuse, seizure (not taking medications, approximately once a year), RLS, dCHF, chronic bronchitis, CKD stage II, depression, anxiety, who presents with chest pain and found to have STEMI.   Met with pt in room today. Pt reports decreased appetite and oral intake along with increased thirst pta. Pt reports a 80lb unintentional weight loss over the past year. Per chart, pt is down 43lbs(21%) over the past year; this is significant. Pt reports her UBW is ~200lbs. Pt ate 100% of her breakfast this morning. Pt is reluctant to try or drink any supplements as she reports they are expensive and she can't afford them at home. Pt also reports chronic diarrhea. Pt reports that she has been worked up for this but it was determined to be stress related. Pt does report pain under her sternum after eating sometimes that resolves after several hours. Pt also reports a foul smelling stool that floats. Recommended for patient to follow up with her PCP to discuss malabsorption issues and possible creon.   RD provided "Heart Healthy/Carbohydrate Controlled Nutrition Therapy" handout from the [employer]. Reviewed patient's dietary recall. Provided examples on ways to decrease sodium and fat intake in the diet. Discouraged intake of processed foods and use of salt shaker. Encouraged fresh fruits and vegetables as well as whole grain sources of carbohydrates to maximize fiber intake.   RD also discussed different food groups and their effects on blood sugar, emphasizing carbohydrate-containing foods. Provided list of carbohydrates and recommended serving sizes of common foods.  Discussed importance of controlled and consistent carbohydrate intake  throughout the day. Provided examples of ways to balance meals/snacks and encouraged intake of high-fiber, whole grain complex carbohydrates.   Teach back method used.  Expect poor compliance.  Body mass index is 29.44 kg/m. Pt meets criteria for overweight based on current BMI.  Current diet order is CHO controlled, patient is consuming approximately 50% of meals at this time. Labs and medications reviewed.   Pt to discharge today  No further nutrition interventions warranted at this time. RD contact information provided. If additional nutrition issues arise, please re-consult RD.    Koleen Distance MS, RD, LDN Please refer to [facility] for RD and/or RD on-call/weekend/after hours pager

## 2020-09-06 NOTE — Progress Notes (Signed)
TR band removed completely, pt had no signs of bleeding and aggrastat was stopped, birlinta was started at 2200 last night. Pt's blood glucose was also in the 400s and Dr.Mansy was informed, 20 units of Novolog was given and new sliding scale with meal coverage will start at 8 am this morning. Pt is alert and oriented, vitals are within normal limits and no new orders at this time.

## 2020-09-12 ENCOUNTER — Encounter: Payer: Self-pay | Admitting: Registered Nurse

## 2020-09-14 ENCOUNTER — Ambulatory Visit: Payer: Medicare HMO | Admitting: Physician Assistant

## 2020-09-14 ENCOUNTER — Other Ambulatory Visit: Payer: Self-pay

## 2020-09-14 ENCOUNTER — Telehealth: Payer: Self-pay

## 2020-09-14 ENCOUNTER — Encounter: Payer: Self-pay | Admitting: Physician Assistant

## 2020-09-14 ENCOUNTER — Ambulatory Visit (INDEPENDENT_AMBULATORY_CARE_PROVIDER_SITE_OTHER): Payer: Medicare HMO | Admitting: Physician Assistant

## 2020-09-14 VITALS — BP 138/80 | HR 83 | Ht 62.0 in | Wt 169.0 lb

## 2020-09-14 DIAGNOSIS — E118 Type 2 diabetes mellitus with unspecified complications: Secondary | ICD-10-CM

## 2020-09-14 DIAGNOSIS — I25118 Atherosclerotic heart disease of native coronary artery with other forms of angina pectoris: Secondary | ICD-10-CM | POA: Diagnosis not present

## 2020-09-14 DIAGNOSIS — E7849 Other hyperlipidemia: Secondary | ICD-10-CM

## 2020-09-14 DIAGNOSIS — G4730 Sleep apnea, unspecified: Secondary | ICD-10-CM

## 2020-09-14 DIAGNOSIS — I10 Essential (primary) hypertension: Secondary | ICD-10-CM | POA: Diagnosis not present

## 2020-09-14 DIAGNOSIS — I252 Old myocardial infarction: Secondary | ICD-10-CM | POA: Diagnosis not present

## 2020-09-14 DIAGNOSIS — E781 Pure hyperglyceridemia: Secondary | ICD-10-CM

## 2020-09-14 DIAGNOSIS — I5189 Other ill-defined heart diseases: Secondary | ICD-10-CM

## 2020-09-14 DIAGNOSIS — J449 Chronic obstructive pulmonary disease, unspecified: Secondary | ICD-10-CM

## 2020-09-14 DIAGNOSIS — Z720 Tobacco use: Secondary | ICD-10-CM

## 2020-09-14 DIAGNOSIS — E1165 Type 2 diabetes mellitus with hyperglycemia: Secondary | ICD-10-CM

## 2020-09-14 DIAGNOSIS — E785 Hyperlipidemia, unspecified: Secondary | ICD-10-CM

## 2020-09-14 DIAGNOSIS — I255 Ischemic cardiomyopathy: Secondary | ICD-10-CM

## 2020-09-14 MED ORDER — POTASSIUM CHLORIDE ER 10 MEQ PO TBCR: 10.0000 meq | EXTENDED_RELEASE_TABLET | Freq: Every day | ORAL | 5 refills | Status: DC

## 2020-09-14 MED ORDER — PRALUENT 150 MG/ML ~~LOC~~ SOAJ: 1.0000 "pen " | SUBCUTANEOUS | 11 refills | Status: DC

## 2020-09-14 MED ORDER — FUROSEMIDE 20 MG PO TABS: 20.0000 mg | ORAL_TABLET | Freq: Every day | ORAL | 5 refills | Status: DC

## 2020-09-14 NOTE — Telephone Encounter (Signed)
Jennifer [name] Key: [id number] PA Case ID: [id number] Rx #: [id number]  Sent to Plan today

## 2020-09-14 NOTE — Telephone Encounter (Signed)
Approvedtoday  Your request has been approved  

## 2020-09-14 NOTE — Progress Notes (Signed)
Office Visit    Patient Name: Jennifer [name] Date of Encounter: 09/14/2020  PCP:  Althea Grimmer, MD   [facility]  Cardiologist:  Julien Nordmann, MD  Advanced Practice Provider:  No care team member to display Electrophysiologist:  None :[phone]}   Chief Complaint    Chief Complaint  Patient presents with  . Follow-up    1 Week follow up and post stemi. Patient c/o know on right wrist from procedure. Patient would like to know if she could still have oral surgery. She has been having some chest pain but not sure if it may be coming from stress or anxiety. Medications verbally reviewed with patient     60 y.o. female with history of CAD s/p multiple prior stents, diastolic dysfunction, DM2, hypertension, hyperlipidemia, sleep apnea not on CPAP, tobacco use, PAD, chronic pain, obesity, recurrent nephrolithiasis, anxiety, depression, and who presents today for follow-up of CAD.  Past Medical History    Past Medical History:  Diagnosis Date  . Anxiety   . Arthritis    "qwhere" (06/14/2014)  . Bipolar disorder (HCC)   . CAD (coronary artery disease)    a. 2008 PCI/DES to RCA; b. 2012 PCI: 95% mRCA s/p PCI/DES; b. 2/16 PCI DES-> 90% dRCA; c. 11/16 PCI: dRCA ISR s/p PCI/DES;  d. 4/18 PCI: p-mRCA 95% s/p PCI/DES; e. 02/2017 Cath: LML nl, LAD nl, D1 40, LCX min irregs, OM1 60, RCA 50-60p/m ISR, 43m/d, patent dRCA stent, RPDA 90ost (fills via L->R collats), EF 55-65%.  . Chronic bronchitis (HCC)    "get it q yr"  . Daily headache    "when my blood pressure is up" (06/14/2014)  . Depression   . Diastolic dysfunction    a. 02/2017 Echo: EF 55-60%, no rwma, Gr1 DD, nl RV fxn.  [name] GERD (gastroesophageal reflux disease)   . Heart murmur   . High cholesterol   . History of stomach ulcers   . Hypertension   . Insomnia   . Kidney stones   . Migraine    "haven't had them in a good while; did get them 1-2 times/yr" (06/14/2014)  . Myocardial infarction (HCC)  2008; ~ 2012  . Pneumonia    "get it 1-2 times/year" (06/14/2014)  . RLS (restless legs syndrome)   . Seizures (HCC)   . Sleep apnea    "they want me to do the lab but I haven't" (06/14/2014)  . Stroke [facility])    "they say I've had several" (06/14/2014)  . Tobacco abuse   . Type II diabetes mellitus (HCC)   . Urinary, incontinence, stress female    Past Surgical History:  Procedure Laterality Date  . ABDOMINAL HYSTERECTOMY  1984   "I've got 1 ovary left"  . CARDIAC CATHETERIZATION N/A 02/26/2015   Procedure: Left Heart Cath and Coronary Angiography;  Surgeon: Iran Ouch, MD;  Location: [facility] INVASIVE CV LAB;  Service: Cardiovascular;  Laterality: N/A;  . CARDIAC CATHETERIZATION N/A 02/26/2015   Procedure: Coronary Stent Intervention;  Surgeon: Iran Ouch, MD;  Location: [facility] INVASIVE CV LAB;  Service: Cardiovascular;  Laterality: N/A;  . CESAREAN SECTION  1984  . CORONARY ANGIOPLASTY WITH STENT PLACEMENT  2008; ~ 2012   "1; 1"  . CORONARY BALLOON ANGIOPLASTY N/A 06/09/2017   Procedure: CORONARY BALLOON ANGIOPLASTY;  Surgeon: Yvonne Kendall, MD;  Location: [facility] INVASIVE CV LAB;  Service: Cardiovascular;  Laterality: N/A;  . CORONARY STENT INTERVENTION N/A 08/04/2016   Procedure:  Coronary Stent Intervention;  Surgeon: Wellington Hampshire, MD;  Location: [place] CV LAB;  Service: Cardiovascular;  Laterality: N/A;  . CORONARY/GRAFT ACUTE MI REVASCULARIZATION N/A 09/05/2020   Procedure: Coronary/Graft Acute MI Revascularization;  Surgeon: Nelva Bush, MD;  Location: [place] CV LAB;  Service: Cardiovascular;  Laterality: N/A;  . DILATION AND CURETTAGE OF UTERUS    . EXTRACORPOREAL SHOCK WAVE LITHOTRIPSY  X 2  . I & D EXTREMITY Left 02/09/2018   Procedure: IRRIGATION AND DEBRIDEMENT EXTREMITY;  Surgeon: Herbert Pun, MD;  Location: [facility] ORS;  Service: General;  Laterality: Left;  . KNEE ARTHROSCOPY Left   . LEFT HEART CATH Bilateral 08/04/2016   Procedure: Left  Heart Cath poss PCI;  Surgeon: Wellington Hampshire, MD;  Location: [place] CV LAB;  Service: Cardiovascular;  Laterality: Bilateral;  . LEFT HEART CATH AND CORONARY ANGIOGRAPHY Left 02/19/2017   Procedure: LEFT HEART CATH AND CORONARY ANGIOGRAPHY;  Surgeon: Minna Merritts, MD;  Location: [place] CV LAB;  Service: Cardiovascular;  Laterality: Left;  . LEFT HEART CATH AND CORONARY ANGIOGRAPHY Left 06/09/2017   Procedure: LEFT HEART CATH AND CORONARY ANGIOGRAPHY;  Surgeon: Nelva Bush, MD;  Location: [place] CV LAB;  Service: Cardiovascular;  Laterality: Left;  . LEFT HEART CATH AND CORONARY ANGIOGRAPHY N/A 04/10/2020   Procedure: LEFT HEART CATH AND CORONARY ANGIOGRAPHY;  Surgeon: Wellington Hampshire, MD;  Location: [place] CV LAB;  Service: Cardiovascular;  Laterality: N/A;  . LEFT HEART CATH AND CORONARY ANGIOGRAPHY N/A 09/05/2020   Procedure: LEFT HEART CATH AND CORONARY ANGIOGRAPHY;  Surgeon: Nelva Bush, MD;  Location: [place] CV LAB;  Service: Cardiovascular;  Laterality: N/A;  . LEFT HEART CATHETERIZATION WITH CORONARY ANGIOGRAM N/A 06/15/2014   Procedure: LEFT HEART CATHETERIZATION WITH CORONARY ANGIOGRAM;  Surgeon: Jettie Booze, MD;  Location: [facility] CATH LAB;  Service: Cardiovascular;  Laterality: N/A;  . PERCUTANEOUS CORONARY STENT INTERVENTION (PCI-S)  06/15/2014   Procedure: PERCUTANEOUS CORONARY STENT INTERVENTION (PCI-S);  Surgeon: Jettie Booze, MD;  Location: [facility] CATH LAB;  Service: Cardiovascular;;  . TONSILLECTOMY    . TUBAL LIGATION      Allergies  Allergies  Allergen Reactions  . Buprenorphine Hcl     Nausea and vomiting  . Carbamazepine Anaphylaxis and Other (See Comments)    Blood dyscrasia when on with Lithium  . Lithium Other (See Comments)    Blood dyscrasia  . Lodine [Etodolac] Shortness Of Breath, Diarrhea, Nausea And Vomiting and Rash  . Methocarbamol Other (See Comments) and Shortness Of Breath    Unknown Rash,  tachypnea  . Codeine Other (See Comments)    Upset stomach  . Levofloxacin In D5w Swelling and Other (See Comments)    "Salty" sensation in mouth. "Hard time to explain it"  . Tape Other (See Comments)    Welps, blister on skin  . Wound Dressing Adhesive   . Relafen [Nabumetone] Diarrhea, Nausea And Vomiting and Rash    History of Present Illness    ADDYSEN LOUTH is a 60 y.o. female with PMH as above.  She has history of CAD s/p multiple prior stents, diastolic dysfunction, DM2, hypertension, hyperlipidemia, sleep apnea not on CPAP, tobacco use, PAD, chronic pain, obesity, recurrent nephrolithiasis, anxiety, and depression.   Coronary artery disease dates back to 2008 with DES to RCA and subsequent 2010 DES to [facility].  Please see the timeline below under CV studies.  She had repeat catheterizations from anginal symptoms in 2016 and 2018.  07/2016  she had LHC with PCI/DES to the proximal/mid RCA within the previously placed stent.  [place] 01/2017 with recommendation for medical management.  LHC 05/2017 with focal 95% ISR requiring PTCA for in-stent restenosis of the RCA and recommended for indefinite DAPT.  She was admitted 03/2020 for recurrent angina and LHC showing significant 1v CAD with patent stent in the proximal/mid RCA and distal RCA with mild ISR.  Moderate stenosis in mid to distal RCA and significant stenosis and small diameter RPDA, unchanged from previous.  Recommendation was for medical management and aggressive secondary prevention.    Echo 03/2020 showed LVEF EF 60 to 65%, and R WMA, mild LVH, RV normal size and function, no significant valvular abnormality.  Most recent 08/2020 echo shows EF 83 to 60%, hypokinesis of the basal inferior wall, moderate LVH, G1 DD.  08/2020 LHC showed severe single-vessel CAD with 100% occluded proximal margin of distal RCA stent with thrombus embolism into the small RPL branches.  Mild proximal to mid RCA in-stent restenosis and stable sequential 50 to  80% RPDA lesions.  Nonobstructive CAD involving the left coronary artery was noted and similar to prior cath 03/2020.  Low normal LV contraction with inferior hypokinesis 50 to 55% moderately elevated filling pressures LVEDP 25 to 30 mmHg were noted.  Successful PCI to distal RCA with placement of overlapping stents with 0% residual stenosis and TIMI-3 flow noted.  The majority of the RCA was now stented in the proximal and distal segments having 2 layers.  Recommendation was to continue to refer been infusion for 18 hours.  DAPT with ASA and Brilinta for at least 12 months, ideally longer given the extensive stenting in the RCA.  Gentle diuresis was recommended, as well as aggressive secondary prevention.  Tobacco and marijuana cessation were advised.  Today, 09/14/2020, she returns to clinic and notes that she continues to have chest pain.  Chest pain is described as starting with a feeling of not being able to breathe.  Her heart will race.  She will then feel discomfort deep under her left breast.  She describes this pain as occasionally and nagging and pressure-like pain.  At times, the pain also appears as if it is burning.  She also feels burning that spreads up her neck and interferes with her daily living.  Associated symptoms include diaphoresis.  She has been unable to figure out any consistent triggers.  She denies any clear association with food or exertion.  She does note that her pain does appear worse with anxiety or sudden stressful events.  For example, she notes exacerbation of symptoms with arguing with her husband or if she witnesses a car accident.  She also noted exacerbation of symptoms with the dog trying to escape the house.  Sometimes, chest discomfort occurs when seated and not exerting herself.  She reports that she does not like to use nitro, as this gives her headache.  In addition to her discomfort, she notes orthopnea and inability to lay flat.  She notes lower extremity edema,  especially in the left leg and knee.  She has not been able to increase activity due to orthopedic problems.  She notes a high level of stress.  She is in the middle of moving.  In addition, she is taking care of her grandchildren and fostering children of other relatives.  She notes that money is tight.  Right before her cardiac catheterization, her brother died from an MI.  She wonders if this is contributing to  her symptoms.  She is not sleeping well.  She wakes short of breath at night.  She also notes that she is snoring.  She never feels well rested.  She has not started cardiac rehab.  She continues to smoke and in fact has increased her tobacco use, reporting 1 pack/day.  At times, she is smoking 2 packs/day.  Following her cardiac catheterization, she noted right radial arteriotomy site discomfort.  On exam today, her radial pulses reduced but she does have sufficient blood flow through her ulnar artery when performing the Allen's test.  She is able to drive her car with stick shift and notes normal range of motion.  She is joined today by her grandson.  She does note recent broken teeth and asks for preoperative evaluation for an oral surgeon today which is not recommended at this time given her recent PCI on DAPT and as outlined below.  She is hopeful to find a new psychiatrist.   STOP-BANG Rex assessment S-snore  Have you been told that you snore?   1  T-tired Are you often tired, fatigued, sleepy during the day?   1  O-obstruction Do you stop breathing, choke, or gasp during sleep?   1  P-pressure Do you have or are you being treated for high blood pressure?  1  B- BMI Is your BMI greater than 35 kg/m?  0  A- age Are you 9 years or older?  1  N-neck Do you have a neck circumference greater than 16 inches?  1  G-gender Are you female?   0  Total  6   Risk Score <3 Low risk of OSA ?3 High risk of OSA   Home Medications   Current Outpatient Medications  Medication Instructions   . albuterol (PROVENTIL HFA;VENTOLIN HFA) 108 (90 Base) MCG/ACT inhaler 2 puffs, Inhalation, Every 4 hours PRN  . Alirocumab (PRALUENT) 150 MG/ML SOAJ 1 pen, Subcutaneous, Every 14 days  . aspirin 81 mg, Oral, Daily  . atorvastatin (LIPITOR) 80 mg, Oral, Daily  . Basaglar KwikPen 40 Units, Subcutaneous, Daily  . blood glucose meter kit and supplies KIT Dispense based on patient and insurance preference. Use up to four times daily as directed. (FOR ICD-9 250.00, 250.01).  . Blood Glucose Monitoring Suppl W/DEVICE KIT Test 1-2 times daily; E11.65 diagnosis  . Brilinta 90 mg, Oral, 2 times daily  . furosemide (LASIX) 20 mg, Oral, Daily  . gabapentin (NEURONTIN) 600 MG tablet 1 tablet, Oral, Daily  . Insulin Pen Needle (UNIFINE PENTIPS) 31G X 5 MM MISC use with basaglar  . metoprolol succinate (TOPROL-XL) 50 mg, Oral, 2 times daily, Take with or immediately following a meal.  . nicotine (NICODERM CQ - DOSED IN MG/24 HOURS) 21 mg, Transdermal, Daily  . nitroGLYCERIN (NITROSTAT) 0.4 mg, Sublingual, Every 5 min PRN  . potassium chloride (KLOR-CON) 10 MEQ tablet 10 mEq, Oral, Daily  . QUEtiapine (SEROQUEL) 200 mg, Oral, Daily at bedtime  . ranolazine (RANEXA) 500 mg, Oral, 2 times daily     Review of Systems    She reports chest pain with radiation up her neck, shortness of breath, dyspnea, racing heart rate, palpitations, edema orthopnea, PND, snoring, anxiety, and stressors.  She denie n, v, dizziness, syncope, weight gain, or early satiety.  All other systems reviewed and are otherwise negative except as noted above.  Physical Exam    VS:  BP 138/80 (BP Location: Left Arm, Patient Position: Sitting, Cuff Size: Normal)   Pulse 83  Ht '5\' 2"'$  (1.575 m)   Wt 169 lb (76.7 kg)   SpO2 97%   BMI 30.91 kg/m  , BMI Body mass index is 30.91 kg/m. GEN: Well nourished, well developed, in no acute distress.  Joined by her grandchild HEENT: normal. Neck: Supple, no JVD, carotid bruits, or  masses. Cardiac: RRR, no murmurs, rubs, or gallops. No clubbing, cyanosis, mild lower extremity edema.  Radials/DP/PT 2+ and equal bilaterally.  Right radial arteriotomy site is without evidence of hematoma or infection.  She does have reduced right radial pulse at 1+ --Allen's test performed with sufficient blood flow through her ulnar artery.  Radial pulse 2+. Respiratory:  Respirations regular and unlabored, clear to auscultation bilaterally. GI: Soft, nontender, nondistended, BS + x 4. MS: no deformity or atrophy. Skin: warm and dry, no rash. Neuro:  Strength and sensation are intact. Psych: Normal affect.  Accessory Clinical Findings    ECG personally reviewed by me today -NSR, 83 bpm, Q waves noted in the inferior leads/previous inferior infarct, poor R wave progression in the anterior leads/precordial leads- no acute changes.  VITALS Reviewed today   Temp Readings from Last 3 Encounters:  09/06/20 98.5 F (36.9 C) (Oral)  07/27/20 98 F (36.7 C) (Oral)  04/11/20 (!) 97.4 F (36.3 C) (Oral)   BP Readings from Last 3 Encounters:  09/14/20 138/80  09/06/20 95/76  07/27/20 121/76   Pulse Readings from Last 3 Encounters:  09/14/20 83  09/06/20 79  07/27/20 68    Wt Readings from Last 3 Encounters:  09/14/20 169 lb (76.7 kg)  09/04/20 161 lb (73 kg)  07/27/20 161 lb (73 kg)     LABS  reviewed today   Lab Results  Component Value Date   WBC 13.5 (H) 09/06/2020   HGB 15.0 09/06/2020   HCT 44.9 09/06/2020   MCV 92.0 09/06/2020   PLT 265 09/06/2020   Lab Results  Component Value Date   CREATININE 0.79 09/06/2020   BUN 29 (H) 09/06/2020   NA 133 (L) 09/06/2020   K 3.8 09/06/2020   CL 103 09/06/2020   CO2 19 (L) 09/06/2020   Lab Results  Component Value Date   ALT 18 09/04/2020   AST 13 (L) 09/04/2020   ALKPHOS 113 09/04/2020   BILITOT 1.1 09/04/2020   Lab Results  Component Value Date   CHOL 304 (H) 09/05/2020   HDL 55 09/05/2020   LDLCALC 227 (H)  09/05/2020   LDLDIRECT 183.5 (H) 07/27/2020   TRIG 108 09/05/2020   CHOLHDL 5.5 09/05/2020    Lab Results  Component Value Date   HGBA1C 14.3 (H) 09/05/2020   Lab Results  Component Value Date   TSH 0.266 (L) 02/23/2015     STUDIES/PROCEDURES reviewed today   2008:  --LHC s/p DES to RCA in 2008.   2010:  --MPI for recurrent CP 2010 showed prior inferior infarct, peri-infarct ischemia. --LHC showed 95%s mRCA, diffuse 60%s in the pRDPA. DES to [facility].   2016: Recurrent CP and transferred from [facility] to [facility].  --[place] 06/15/2014 with patent but small LM, mild dz LAD / branches, mild to moderate dz in LCx /branches, 90%s RCA s/p DES, LVEDP 28 mmHg.  --MPI 02/23/2015 for recurrent CP /palpitations, ruled intermediate risk study. --LHC 02/26/2015 with severe one-vessel CAD & severe ISR in the proximal segment of the previously placed stent in the dRCA.  DES to [facility] for ISR. Mildly elevated LVEDP. --Stress for nonexertional CP 03/19/2015 ruled low risk.  2018:  --  Admitted to [facility] 07/2016 with UA/NSTEM, peak troponin 0 0.12. --07/2016 echo EF 60 to 65%, NRWMA. --07/2016 LHC with severe 1v CAD affecting the RCA with multiple previous stenting. There was felt to be a plaque rupture within the p-m stent, felt the culprit for her UA. DES to p-mRCA within the previously placed stent. 60% dz in the m-d segment, left to be treated medically. --Reported UA in office 02/18/2017 --02/19/2017 LHC D1 40%s, LCx minor irreg, OM1 60%s, p-m RCA 50-60%s with ISR in the proximal region, m-d RCA 50%, RPDA 90% filled by collaterals from third septal, LVEF 55 to 65%. Medical management advised. --02/24/2017 echo EF 55-60%, NRWMA.   2019:  --MPI with large region of inferior wall ischemia as well as inferior lateral wall ischemia, EF 50%, lateral wall hypokinesis, high risk scan. --LHC 06/09/2017 with severe single-vessel CAD with focal 95% ISR of the mRCA where 2 layers of the stent were already placed. There was  moderate stenosis of the m-d RCA (unchanged from prior cath) and high-grade dz involving the ostial/proximal PDA. Mild to moderate nonobstructive CAD involving the LCA. Mild elevation of LV filling pressure. She underwent successful PTCA to ISR of the mRCA with reduction of stenosis from 95% to 0%. Lifelong DAPT recommended.  03/2020 --NSTEMI with Cath showing severe single-vessel CAD with 100% occlusion of the proximal margin of distal RCA stent with thrombus embolization into the small RPL branches.  PCI with placement of overlapping stent to distal RCA.  No normal LV contraction with inferior hypokinesis EF 50 to 55% moderately elevated filling pressure with LVEDP 25 to 30 mmHg -- LVEF 55-60 with hypokinesis of basal inferior wall and moderate LVH, G1 DD  Echo 09/06/20 1. Left ventricular ejection fraction, by estimation, is 55 to 60 %. The  left ventricle has normal function. Unable to exclude hypokinesis of the  basal inferior wall. There is moderate left ventricular hypertrophy. Left  ventricular diastolic parameters  are consistent with Grade I diastolic dysfunction (impaired relaxation).  2. Right ventricular systolic function is normal. The right ventricular  size is normal.   LHC 09/05/20 Conclusions: 1. Severe single-vessel coronary artery disease with thrombotic 100% occlusion at the proximal margin of distal RCA stent with thrombus embolization into small RPL branches. 2. Mild proximal/mid RCA in-stent restenosis and stable sequential 50-80% RPDA lesions. 3. Nonobstructive CAD involving the left coronary artery, similar to prior catheterization in 03/2020. 4. Low normal left ventricular contraction with inferior hypokinesis (LVEF 50-55%) and moderately elevated filling pressure (LVEDP 25-30 mmHg). 5. Successful PCI to distal RCA with placement of overlapping Resolute Onyx 3.0 x 18 mm (proximal) and 3.0 x 15 mm (distal) drug-eluting stents with 0% residual stenosis and TIMI-3 flow.   The majority of the RCA is now stented with the proximal and distal segments having 2 layers. Recommendations: 1. Continue tirofiban infusion for 18 hours. 2. Dual antiplatelet therapy with aspirin and ticagrelor for at least 12 months, ideally longer given extensive stenting in the RCA. 3. Gentle diuresis; patient received furosemide 20 mg IV x1 at the end of the catheterization. 4. Aggressive secondary prevention including improved blood pressure and glycemic control, as well as tobacco/marijuana cessation.  LHC 04/10/20  1st Diag lesion is 40% stenosed.  1st Mrg lesion is 25% stenosed.  Non-stenotic Dist RCA lesion was previously treated.  Mid RCA lesion is 40% stenosed.  Ost RPDA to RPDA lesion is 80% stenosed.  Prox RCA to Mid RCA lesion is 20% stenosed. 1. Significant one-vessel coronary  artery disease with patent stents in the proximal/mid RCA as well as distal RCA with mild in-stent restenosis. Moderate stenosis in the mid to distal RCA and significant stenosis in a small diameter right PDA is unchanged from most recent catheterization and if anything the 2 lesions look better. Mild disease affecting the left system. 2. Left ventricular angiography was not performed. EF was normal by echo. Mildly elevated left ventricular end-diastolic pressure. Recommendations: I do not see a clear culprit for the patient's symptoms of unstable angina. She does have small vessel disease but it actually appears better than most recent angiography in 2018. I recommend continuing medical therapy. I do suspect that anxiety is significantly contributing to her symptoms.  06/09/2017 LHC Conclusions: 1. Severe single-vessel coronary artery disease with focal 95% in-stent restenosis of the mid RCA, where two layers of stent are already present. There is also moderate stenosis of the mid/distal RCA (unchanged from prior caths) and high grade disease involving the ostial/proximal segment of the  PDA (small vessel). 2. Mild to moderate, nonobstructive CAD involving the left coronary artery. 3. Mildly elevated left ventricular filling pressure. 4. Successful balloon angioplasty to in-stent restenosis of the mid RCA with reduction of stenosis from 95% to 0%. Recommendations: 1. Admit for overnight observation/extended recovery. 2. Dual antiplatelet therapy with aspirin and clopidogrel through at least 07/2016, ideally lifelong. 3. Aggressive secondary prevention, including smoking cessation. 4. If in-stent restenosis recurs, referral for single vessel CABG, brrachytherapy, or drug-coated balloon angioplasty (not available in the [place]) would need to be considered.  02/2017 Echo - Left ventricle: The cavity size was normal. Systolic function was  normal. The estimated ejection fraction was in the range of 55%  to 60%. Wall motion was normal; there were no regional wall  motion abnormalities. Doppler parameters are consistent with  abnormal left ventricular relaxation (grade 1 diastolic  dysfunction).  - Right ventricle: Systolic function was normal.  - Pulmonary arteries: Systolic pressure was within the normal  range.    Assessment & Plan     Recent inferior STEMI Coronary artery disease --No current chest pain. See HPI above for details.  Most recent cath with nonobstructive left coronary artery disease and thrombotic occlusion of the distal RCA just proximal to the old distal RCA stent.  Significant thrombus was present with extension into the old distal RCA stent, as well as embolization into the RPL branches.  Placement of 2 overlapping DES was performed in the mid/distal RCA such that most of the RCA is now stented (2 layers of stent in the proximal and distal vessel).  She reports compliance with DAPT/medications.  Most recent echo as above with EF 55-60 and unable to exclude hypokinesis of the basal inferior wall.  Continue DAPT with ASA and ticagrelor  for at least 12 months, ideally longer in the setting of extensive stenting of the RCA.  Aggressive secondary prevention.  Given her female hyperlipidemia and poorly controlled LDL on atorvastatin, recommend PCSK9 inhibitors at this time.  In addition, recommend improved BP and glycemic control.  Suspect she is holding onto some fluid with elevated LVEDP by catheterization and patient report today that she has not been taking her Lasix very frequently, and with improved fluid status, BP will also show improvement.  ICM, diastolic dysfunction -- Reports some shortness of breath with chest discomfort as above, as well as some orthopnea, PND, and edema.  Currently on as needed Lasix, and she does report that she is uncertain how frequently to  take this medication.  Suspect volume overload, which is contributing to her elevated BP.  Most recent echo as above with EF 55 to 60%, possible hypokinesis of the basal inferior wall, moderate LVH, G1DD.    Continue metoprolol succinate and lisinopril.  Will increase  Increase to furosemide 20 mg daily.  Start potassium 10 M EQ daily.  BMET today and at RTC.    Adjustment of furosemide and potassium pending results of BMET from today, if indicated.  HTN -- BP uncontrolled/borderline today.  Medication changes as above.  We will continue current Toprol and lisinopril and increase diuresis.  If BP still elevated at RTC, consider further adjustment of current antihypertensives.  Will defer for now.        Hyperlipidemia, goal LDL below 70 Hypertriglyceridemia --Poorly controlled, despite atorvastatin 80 mg daily.  Recommend LDL goal below 70.   Recommend PCSK9 inhibitor, given her medications currently include atorvastatin 80 mg and previously Zetia 10 mg with LDL still uncontrolled.  Tobacco use/marijuana use/COPD --Complete cessation recommended. Discussed risks of continued smoking today. She reports that she desires to cut back on smoking, though  difficult with current stressors.  Obesity --Heart healthy diet and increased exercise advised. Weight loss advised.  DM2, poorly controlled --Hemoglobin A1c of 14.6 and not well controlled. Per IM, PCP. Glycemic control recommended for risk factor modification.  Sleep disordered breathing --Reports sleep disordered breathing/snoring and waking up at night choking.  Stop bang performed as above with total score of 6.  She indicates a desire to get tested for sleep apnea in the future.  We discussed watch Pat with patient agreement to proceed.  Preoperative evaluation --Given recent stenting and inability to disrupt DAPT, recommend against proceeding with any oral surgery at this time with patient understanding.    Medication changes: Increase to Lasix 20 mg daily.  Add KCl tab 10 M EQ daily.  Start Praluent. Labs ordered: C-Met, CBC Studies / Imaging ordered: WatchPat Future considerations: Lipid and liver function in 6 to 8 weeks, repeat BMET at RTC Disposition: 2 weeks  *Please be aware that the above documentation was completed voice recognition software and may contain dictation errors.      Arvil Chaco, PA-C 09/14/2020

## 2020-09-14 NOTE — Patient Instructions (Signed)
Medication Instructions:  Your physician has recommended you make the following change in your medication:   CHANGE Lasix (Furosemide) to 20mg  DAILY  START Potassium DAILY  START Praluent 1 injection under skin every 2 weeks  *If you need a refill on your cardiac medications before your next appointment, please call your pharmacy*   Lab Work:  1) Your physician recommends that you have lab work TODAY: CMET, CBC  2) Your physician recommends that you return to the [facility] at [facility] for FASTING lab work in: 6-8 weeks (Lipid/Liver panel) -  Please go to the [facility]. You will check in at the front desk to the right as you walk into the atrium. Valet Parking is offered if needed. - No appointment needed. You may go any day between 7 am and 6 pm.   Testing/Procedures:  WatchPAT?  Is a FDA cleared portable home sleep study test that uses a watch and 3 points of contact to monitor 7 different channels, including your heart rate, oxygen saturations, body position, snoring, and chest motion.  The study is easy to use from the comfort of your own home and accurately detect sleep apnea.  Before bed, you attach the chest sensor, attached the sleep apnea bracelet to your nondominant hand, and attach the finger probe.  After the study, the raw data is downloaded from the watch and scored for apnea events.   For more information: [email or web address]   Follow-Up: At [facility], you and your health needs are our priority.  As part of our continuing mission to provide you with exceptional heart care, we have created designated Provider Care Teams.  These Care Teams include your primary Cardiologist (physician) and Advanced Practice Providers (APPs -  Physician Assistants and Nurse Practitioners) who all work together to provide you with the care you need, when you need it.  We recommend signing up for the patient portal called "MyChart".  Sign up information is  provided on this After Visit Summary.  MyChart is used to connect with patients for Virtual Visits (Telemedicine).  Patients are able to view lab/test results, encounter notes, upcoming appointments, etc.  Non-urgent messages can be sent to your provider as well.   To learn more about what you can do with MyChart, go to [facility].    Your next appointment:   2 week(s)  The format for your next appointment:   In Person  Provider:   You may see [email or web address], MD or one of the following Advanced Practice Providers on your designated Care Team:     Crumpler, ANGERS

## 2020-09-15 ENCOUNTER — Telehealth: Payer: Self-pay | Admitting: Physician Assistant

## 2020-09-15 DIAGNOSIS — I10 Essential (primary) hypertension: Secondary | ICD-10-CM

## 2020-09-15 DIAGNOSIS — I25118 Atherosclerotic heart disease of native coronary artery with other forms of angina pectoris: Secondary | ICD-10-CM

## 2020-09-15 DIAGNOSIS — Z79899 Other long term (current) drug therapy: Secondary | ICD-10-CM

## 2020-09-15 LAB — CBC
Hematocrit: 39.9 % (ref 34.0–46.6)
Hemoglobin: 12.6 g/dL (ref 11.1–15.9)
MCH: 30.7 pg (ref 26.6–33.0)
MCHC: 31.6 g/dL (ref 31.5–35.7)
MCV: 97 fL (ref 79–97)
Platelets: 290 10*3/uL (ref 150–450)
RBC: 4.11 x10E6/uL (ref 3.77–5.28)
RDW: 12.6 % (ref 11.7–15.4)
WBC: 9.7 10*3/uL (ref 3.4–10.8)

## 2020-09-15 LAB — COMPREHENSIVE METABOLIC PANEL
ALT: 20 IU/L (ref 0–32)
AST: 16 IU/L (ref 0–40)
Albumin/Globulin Ratio: 1.9 (ref 1.2–2.2)
Albumin: 4.2 g/dL (ref 3.8–4.9)
Alkaline Phosphatase: 184 IU/L — ABNORMAL HIGH (ref 44–121)
BUN/Creatinine Ratio: 20 (ref 9–23)
BUN: 19 mg/dL (ref 6–24)
Bilirubin Total: <0.2 mg/dL (ref 0.0–1.2)
CO2: 21 mmol/L (ref 20–29)
Calcium: 9.8 mg/dL (ref 8.7–10.2)
Chloride: 99 mmol/L (ref 96–106)
Creatinine, Ser: 0.94 mg/dL (ref 0.57–1.00)
Globulin, Total: 2.2 g/dL (ref 1.5–4.5)
Glucose: 520 mg/dL (ref 65–99)
Potassium: 5.1 mmol/L (ref 3.5–5.2)
Sodium: 135 mmol/L (ref 134–144)
Total Protein: 6.4 g/dL (ref 6.0–8.5)
eGFR: 71 mL/min/{1.73_m2} (ref >59)

## 2020-09-18 ENCOUNTER — Telehealth: Payer: Self-pay

## 2020-09-18 ENCOUNTER — Other Ambulatory Visit
Admission: RE | Admit: 2020-09-18 | Discharge: 2020-09-18 | Disposition: A | Discharge status: Home / Self Care | Payer: Medicare HMO | Attending: Internal Medicine | Admitting: Internal Medicine

## 2020-09-18 ENCOUNTER — Telehealth: Payer: Self-pay | Admitting: *Deleted

## 2020-09-18 DIAGNOSIS — I25118 Atherosclerotic heart disease of native coronary artery with other forms of angina pectoris: Secondary | ICD-10-CM | POA: Insufficient documentation

## 2020-09-18 DIAGNOSIS — I10 Essential (primary) hypertension: Secondary | ICD-10-CM | POA: Diagnosis present

## 2020-09-18 DIAGNOSIS — Z79899 Other long term (current) drug therapy: Secondary | ICD-10-CM | POA: Insufficient documentation

## 2020-09-18 LAB — BASIC METABOLIC PANEL
Anion gap: 10 (ref 5–15)
BUN: 27 mg/dL — ABNORMAL HIGH (ref 6–20)
CO2: 23 mmol/L (ref 22–32)
Calcium: 9.5 mg/dL (ref 8.9–10.3)
Chloride: 101 mmol/L (ref 98–111)
Creatinine, Ser: 1.01 mg/dL — ABNORMAL HIGH (ref 0.44–1.00)
GFR, Estimated: >60 mL/min (ref >60)
Glucose, Bld: 473 mg/dL — ABNORMAL HIGH (ref 70–99)
Potassium: 4.4 mmol/L (ref 3.5–5.1)
Sodium: 134 mmol/L — ABNORMAL LOW (ref 135–145)

## 2020-09-18 NOTE — Telephone Encounter (Signed)
Per Jennifer [name] is not required for CPT 95800.  Ref3 [id number].  Message to [facility] triage pool.

## 2020-09-18 NOTE — Telephone Encounter (Signed)
Jennifer Alstrom, PA-C  09/17/2020 8:06 AM EDT      Several call attempts (3) made to both daughter and patient to review lab results over the weekend and on 09/17/20. Numbers all invalid. --Glucose high at 520. --Renal function stable. --Potassium high at 5.1, though likely inaccurate given the high glucose value and could potentially improve with diuresis.  Recommendations --Repeat BMET ASAP --Adjust insulin --Arrange home health, if possible --Further recommendations based on repeat BMET to confirm K value and given she is currently on a potassium supplement, though K value likely not accurate with current glucose

## 2020-09-18 NOTE — Telephone Encounter (Signed)
PIN number [id number] given to pt to begin sleep study.  Reviewed instructions with pt again and pt voiced understanding.

## 2020-09-18 NOTE — Telephone Encounter (Signed)
-----   Message from Jefferey Pica, RN sent at 09/18/2020  1:51 PM EDT ----- Regarding: FW: WatchPat [facility]  ----- Message ----- From: Patricia Pesa, RN Sent: 09/18/2020  12:52 PM EDT To: [facility] Triage Subject: FW: WatchPat [facility]                            Sorry, please notify patient to proceed with WatchPat One.   ----- Message ----- From: Patricia Pesa, RN Sent: 09/18/2020  12:42 PM EDT To: [facility] Triage Subject: RE: Cheron Schaumann                            Per Pat Patrick is not required for CPT 95800.  Ref3 [id number].  ----- Message ----- From: Annia Belt, RN Sent: 09/14/2020  11:06 AM EDT To: [facility] Subject: WatchPat [place]                                Pt given WatchPat home sleep study in office today.  Please send for precert.  Thank you!

## 2020-09-18 NOTE — Telephone Encounter (Signed)
I called and spoke with the patient.  I have advised her of her BMP/ CBC results per Marisue Ivan, PA.  The patient is aware she will need to have a repeat BMP done ASAP. She will try to come for this this afternoon after she drops her daughter off at work.  She states her blood sugars have been running in the 300's this weekend. She did have an episode of acute onset of dizziness while sitting in the chair yesterday. She went to lay down and her symptoms lasted for ~ 10 minutes.  She states her DBP has also been running the upper 90-low 100's as well.   I inquired if she is followed by a PCP/ endocrinologist and she advised she is not. She is trying contact her Medicaid worker to help with this as she was originally told she needed to go to [facility]. She advised she called them and she was told they were not taking new patient's. I offered to give her the [facility] number as well, but she advised, "I'm moving and I can't find a pen."  I offered to call her back and leave the # on her voice mail, but she advised "my voice mail is full and I have to clean it off."  I have asked her to come for her BMP this afternoon and we can go from there.  Upon documenting this note, I see that Jacquelyn recommended home health, but I am not sure what for.

## 2020-09-20 ENCOUNTER — Telehealth: Payer: Self-pay | Admitting: Physician Assistant

## 2020-09-20 NOTE — Telephone Encounter (Signed)
Jennifer Alstrom, PA-C  09/19/2020 5:22 PM EDT      --Glucose still elevated to a very high number at 473 --K+ improved with restart of lasix  Does she have a primary care physician that can adjust medications to control her glucose? Please have her keep a log of her weight and blood pressures. She should bring this to clinic with her. Her kidney function did bump slightly but was very similar to that seen in office back on 07/27/20. Suspect that her uncontrolled sugars are contributing to her kidney function - she should make sure to find a PCP (I know she has been looking for one). We will plan to recheck her kidney function again at her upcoming visit.

## 2020-09-20 NOTE — Telephone Encounter (Addendum)
Attempted again to reach the patient. pts phone goes straight to voicemail. Unable to lmom. Pt voicemail is full.

## 2020-09-20 NOTE — Telephone Encounter (Signed)
Patient returning call.

## 2020-09-20 NOTE — Telephone Encounter (Signed)
Attempted to call the patient back. Patients phone goes straight to voicemail. Unable to lmom. Patients voicemail is full.

## 2020-09-20 NOTE — Telephone Encounter (Signed)
Attempted to call the patient. No answer- her voice mail box is full.  I spoke with her on 09/18/20 (see 09/15/20 phone note). I confirmed with her at that time that she does not have a PCP/ endocrinologist.  I offered the [facility] # but she said she was moving and couldn't find a pen. I offered to call her back and leave the # on her voice mail, but she said the voice mail was full at that time.   Will attempt to call back at a later time

## 2020-09-21 ENCOUNTER — Telehealth: Payer: Self-pay | Admitting: Licensed Clinical Social Worker

## 2020-09-21 NOTE — Telephone Encounter (Signed)
I called and spoke with the patient regarding her most recent BMP results.  I inquired if she was able to reach back out to her Medicaid worker to see if they can help directing her to a PCP. Per the patient "they never call back."  I advised her that she needs to establish with a PCP and I would like to reach out to our social work team to see if they can help facilitate this as well. The patient was agreeable with this plan. She states " I also need mental health," but I advised once she establishes with a PCP, then they can help facilitate this.  The patient voices understanding and is agreeable.  Staff message sent to Rosetta Posner, CSW to recommendations regarding obtaining a PCP for the patient.

## 2020-09-21 NOTE — Telephone Encounter (Signed)
CSW consulted to speak with pt regarding current lack of PCP (needs follow up regarding elevated blood sugar concerns) as well as pt request to get assistance with finding a mental health provider.  CSW called pt to discuss- unable to reach- left VM requesting return call  Burna Sis, LCSW Clinical Social Worker [facility] Desk#: [phone] Cell#: [phone]

## 2020-09-25 ENCOUNTER — Telehealth: Payer: Self-pay | Admitting: *Deleted

## 2020-09-25 NOTE — Telephone Encounter (Signed)
-----   Message from Burna Sis, [place] sent at 09/21/2020  2:59 PM EDT ----- I will be glad to reach out to her to see how we can help! ----- Message ----- From: Jefferey Pica, RN Sent: 09/21/2020   2:32 PM EDT To: Jefferey Pica, RN, Burna Sis, LCSW, #  Carolyne Fiscal,  Not sure if you are able to help with this, but this patient has recently seen Marisue Ivan in the [place].  Her blood sugars are running 400-500.  Recent discharge on 09/06/20 for a STEMI.  She has no primary care doctor.  She states she was told to call [facility] by her Medicaid worker. She said she called and was told they weren't taking new patients.  I spoke with her last week and tried to give her the [facility] #, but not sure if that would help. However, she said at the time, she was moving and didn't have a pen. I offered to call her back and leave a voice mail- she said her voice mail was full :-((  I just spoke with her again today and asked if I reached out to you if she would be ok with that and she said yes.  Just seeing if there is anything you think we can try to do to help her get a PCP to help manage her blood sugars (& per her request, refer her to Mental Health).  That's a lot- so sorry :-)))  Thank you! Herbert Seta, RN

## 2020-09-25 NOTE — Telephone Encounter (Signed)
Reviewed the patient's chart today. CSW has attempted to reach out to her to assist with establishing with a PCP.  See 09/21/20 phone note.

## 2020-09-26 ENCOUNTER — Telehealth: Payer: Self-pay | Admitting: Licensed Clinical Social Worker

## 2020-09-26 NOTE — Telephone Encounter (Signed)
CSW called pt to discuss getting PCP and assistance finding mental health providers.  Unable to reach- left VM requesting return call  Will continue to follow and assist as needed  Burna Sis, LCSW Clinical Social Worker [facility] Desk#: [phone] Cell#: [phone]

## 2020-09-28 ENCOUNTER — Telehealth: Payer: Self-pay | Admitting: Licensed Clinical Social Worker

## 2020-09-28 NOTE — Telephone Encounter (Signed)
CSW attempted to all pt to discuss getting PCP and mental health provider- went straight to VM- left message requesting return call  Burna Sis, LCSW Clinical Social Worker [facility] Desk#: [phone] Cell#: [phone]

## 2020-09-30 NOTE — Progress Notes (Deleted)
Office Visit    Patient Name: Jennifer [name] Date of Encounter: 09/30/2020  PCP:  Jennifer Flow, MD   [place]  Cardiologist:  Jennifer Rogue, MD  Advanced Practice Provider:  No care team member to display Electrophysiologist:  None :[phone]}   Chief Complaint    No chief complaint on file.   60 y.o. female with history of CAD s/p multiple prior stents, diastolic dysfunction, DM2, hypertension, hyperlipidemia, sleep apnea not on CPAP, tobacco use, PAD, chronic pain, obesity, recurrent nephrolithiasis, anxiety, depression, and who presents today for 2 week follow-up of CAD.  Past Medical History    Past Medical History:  Diagnosis Date   Anxiety    Arthritis    "qwhere" (06/14/2014)   Bipolar disorder ([place])    CAD (coronary artery disease)    a. 2008 PCI/DES to RCA; b. 2012 PCI: 95% mRCA s/p PCI/DES; b. 2/16 PCI DES-> 90% dRCA; c. 11/16 PCI: dRCA ISR s/p PCI/DES;  d. 4/18 PCI: p-mRCA 95% s/p PCI/DES; e. 02/2017 Cath: LML nl, LAD nl, D1 40, LCX min irregs, OM1 60, RCA 50-60p/m ISR, 93md, patent dRCA stent, RPDA 90ost (fills via L->R collats), EF 55-65%.   Chronic bronchitis ([place]    "get it q yr"   Daily headache    "when my blood pressure is up" (06/14/2014)   Depression    Diastolic dysfunction    a. 02/2017 Echo: EF 55-60%, no rwma, Gr1 DD, nl RV fxn.   GERD (gastroesophageal reflux disease)    Heart murmur    High cholesterol    History of stomach ulcers    Hypertension    Insomnia    Kidney stones    Migraine    "haven't had them in a good while; did get them 1-2 times/yr" (06/14/2014)   Myocardial infarction ([place] 2008; ~ 2012   Pneumonia    "get it 1-2 times/year" (06/14/2014)   RLS (restless legs syndrome)    Seizures ([place]    Sleep apnea    "they want me to do the lab but I haven't" (06/14/2014)   Stroke ([facility]    "they say I've had several" (06/14/2014)   Tobacco abuse    Type II diabetes mellitus (HCC)    Urinary,  incontinence, stress female    Past Surgical History:  Procedure Laterality Date   ABDOMINAL HYSTERECTOMY  1984   "I've got 1 ovary left"   CARDIAC CATHETERIZATION N/A 02/26/2015   Procedure: Left Heart Cath and Coronary Angiography;  Surgeon: Jennifer Hampshire MD;  [place] [facility];  Service: Cardiovascular;  Laterality: N/A;   CARDIAC CATHETERIZATION N/A 02/26/2015   Procedure: Coronary Stent Intervention;  Surgeon: Jennifer Hampshire MD;  [place] [place];  Service: Cardiovascular;  Laterality: N/A;   CESAREAN SECTION  1984   CORONARY ANGIOPLASTY WITH STENT PLACEMENT  2008; ~ 2012   "1; 1"   CORONARY BALLOON ANGIOPLASTY N/A 06/09/2017   Procedure: CORONARY BALLOON ANGIOPLASTY;  Surgeon: Jennifer Bush MD;  [place];  Service: Cardiovascular;  Laterality: N/A;   CORONARY STENT INTERVENTION N/A 08/04/2016   Procedure: Coronary Stent Intervention;  Surgeon: Jennifer Hampshire MD;  [place] [facility];  Service: Cardiovascular;  Laterality: N/A;   CORONARY/GRAFT ACUTE MI REVASCULARIZATION N/A 09/05/2020   Procedure: Coronary/Graft Acute MI Revascularization;  Surgeon: Jennifer Bush MD;  [place] [facility];  Service: Cardiovascular;  Laterality: N/A;   DILATION AND CURETTAGE  OF UTERUS     EXTRACORPOREAL SHOCK WAVE LITHOTRIPSY  X 2   I & D EXTREMITY Left 02/09/2018   Procedure: IRRIGATION AND DEBRIDEMENT EXTREMITY;  Surgeon: Jennifer Pun, MD;  Location: [facility] ORS;  Service: General;  Laterality: Left;   KNEE ARTHROSCOPY Left    LEFT HEART CATH Bilateral 08/04/2016   Procedure: Left Heart Cath poss PCI;  Surgeon: Jennifer Hampshire, MD;  Location: [place] [facility];  Service: Cardiovascular;  Laterality: Bilateral;   LEFT HEART CATH AND CORONARY ANGIOGRAPHY Left 02/19/2017   Procedure: LEFT HEART CATH AND CORONARY ANGIOGRAPHY;  Surgeon: Jennifer Merritts, MD;  Location: [place] [facility] LAB;  Service:  Cardiovascular;  Laterality: Left;   LEFT HEART CATH AND CORONARY ANGIOGRAPHY Left 06/09/2017   Procedure: LEFT HEART CATH AND CORONARY ANGIOGRAPHY;  Surgeon: Jennifer Bush, MD;  Location: [place] CV LAB;  Service: Cardiovascular;  Laterality: Left;   LEFT HEART CATH AND CORONARY ANGIOGRAPHY N/A 04/10/2020   Procedure: LEFT HEART CATH AND CORONARY ANGIOGRAPHY;  Surgeon: Jennifer Hampshire, MD;  Location: [place] CV LAB;  Service: Cardiovascular;  Laterality: N/A;   LEFT HEART CATH AND CORONARY ANGIOGRAPHY N/A 09/05/2020   Procedure: LEFT HEART CATH AND CORONARY ANGIOGRAPHY;  Surgeon: Jennifer Bush, MD;  Location: [place] CV LAB;  Service: Cardiovascular;  Laterality: N/A;   LEFT HEART CATHETERIZATION WITH CORONARY ANGIOGRAM N/A 06/15/2014   Procedure: LEFT HEART CATHETERIZATION WITH CORONARY ANGIOGRAM;  Surgeon: Jennifer Booze, MD;  Location: [facility] CATH LAB;  Service: Cardiovascular;  Laterality: N/A;   PERCUTANEOUS CORONARY STENT INTERVENTION (PCI-S)  06/15/2014   Procedure: PERCUTANEOUS CORONARY STENT INTERVENTION (PCI-S);  Surgeon: Jennifer Booze, MD;  Location: [facility] CATH LAB;  Service: Cardiovascular;;   TONSILLECTOMY     TUBAL LIGATION      Allergies  Allergies  Allergen Reactions   Buprenorphine Hcl     Nausea and vomiting   Carbamazepine Anaphylaxis and Other (See Comments)    Blood dyscrasia when on with Lithium   Lithium Other (See Comments)    Blood dyscrasia   Lodine [Etodolac] Shortness Of Breath, Diarrhea, Nausea And Vomiting and Rash   Methocarbamol Other (See Comments) and Shortness Of Breath    Unknown Rash, tachypnea   Codeine Other (See Comments)    Upset stomach   Levofloxacin In D5w Swelling and Other (See Comments)    "Salty" sensation in mouth. "Hard time to explain it"   Tape Other (See Comments)    Welps, blister on skin   Wound Dressing Adhesive    Relafen [Nabumetone] Diarrhea, Nausea And Vomiting and Rash    History of Present  Illness    Jennifer [name] is a 60 y.o. female with PMH as above.  She has history of CAD s/p multiple prior stents, diastolic dysfunction, DM2, hypertension, hyperlipidemia, sleep apnea not on CPAP, tobacco use, PAD, chronic pain, obesity, recurrent nephrolithiasis, anxiety, and depression.   Coronary artery disease dates back to 2008 with DES to RCA and subsequent 2010 DES to [facility].  Please see the timeline below under CV studies.  She had repeat catheterizations from anginal symptoms in 2016 and 2018.  07/2016 she had LHC with PCI/DES to the proximal/mid RCA within the previously placed stent.  [place] 01/2017 with recommendation for medical management.  LHC 05/2017 with focal 95% ISR requiring PTCA for in-stent restenosis of the RCA and recommended for indefinite DAPT.  She was admitted 03/2020 for recurrent angina and LHC showing significant 1v CAD with patent  stent in the proximal/mid RCA and distal RCA with mild ISR.  Moderate stenosis in mid to distal RCA and significant stenosis and small diameter RPDA, unchanged from previous.  Recommendation was for medical management and aggressive secondary prevention.    Echo 03/2020 showed LVEF EF 60 to 65%, and R WMA, mild LVH, RV normal size and function, no significant valvular abnormality.  Most recent 08/2020 echo shows EF 56 to 60%, hypokinesis of the basal inferior wall, moderate LVH, G1 DD.  08/2020 LHC showed severe single-vessel CAD with 100% occluded proximal margin of distal RCA stent with thrombus embolism into the small RPL branches.  Mild proximal to mid RCA in-stent restenosis and stable sequential 50 to 80% RPDA lesions.  Nonobstructive CAD involving the left coronary artery was noted and similar to prior cath 03/2020.  Low normal LV contraction with inferior hypokinesis 50 to 55% moderately elevated filling pressures LVEDP 25 to 30 mmHg were noted.  Successful PCI to distal RCA with placement of overlapping stents with 0% residual stenosis and TIMI-3  [name] noted.  The majority of the RCA was now stented in the proximal and distal segments having 2 layers.  Recommendation was to continue to refer been infusion for 18 hours.  DAPT with ASA and Brilinta for at least 12 months, ideally longer given the extensive stenting in the RCA.  Gentle diuresis was recommended, as well as aggressive secondary prevention.  Tobacco and marijuana cessation were advised.  Seen 09/14/20 and continued to note CP, racing HR, SOB, orthopnea, PND, fatigue, and LEE. She had CP that felt like a deep pressure under her left breast. At times, she also felt burning CP that spread up her neck and interfered with her daily living.  Associated symptoms include diaphoresis and SOB.  She was unable to figure out any consistent triggers.  No clear association with food or exertion.  Sx were wose with stressful events, such as arguing with her husband, witnessing a car accident, or the dog trying to escape the house. She did not like to use nitro due to HA.  She had not been able to increase activity due to orthopedic problems.  She was not sleeping well and never felt well rested.  She had not started cardiac rehab.  She continued to smoke, increasing to 1 pack/day - 2 packs/day. She had a high level of stress.  She was in the middle of moving.  She was also taking care of her grandchildren and fostering children of other relatives.  Money was tight.  Right before her cardiac catheterization, her brother had died from an MI.  She wondered if this was contributing to her symptoms.  She had broken teeth with surgery not recommended at this time given her recent PCI on DAPT.  She was hopeful to find a new psychiatrist. Lasix was increased to daily use with KCL tab and pt started on Praluent. Watchpat ordered.  After this time, labs resulted and with high potassium and glucose. Recommendations were for repeat labs and adjustment of insulin. Social work was contacted to possibly assist the pt as  well. Since then, Jorge Ny, LCSW has attempted to reach the pt and left a voicemail with her.   --Repeat BMET =--Lipid and liver     Home Medications   Current Outpatient Medications  Medication Instructions   albuterol (PROVENTIL HFA;VENTOLIN HFA) 108 (90 Base) MCG/ACT inhaler 2 puffs, Inhalation, Every 4 hours PRN   Alirocumab (PRALUENT) 150 MG/ML SOAJ 1 pen, Subcutaneous,  Every 14 days   aspirin 81 mg, Oral, Daily   atorvastatin (LIPITOR) 80 mg, Oral, Daily   Basaglar KwikPen 40 Units, Subcutaneous, Daily   blood glucose meter kit and supplies KIT Dispense based on patient and insurance preference. Use up to four times daily as directed. (FOR ICD-9 250.00, 250.01).   Blood Glucose Monitoring Suppl W/DEVICE KIT Test 1-2 times daily; E11.65 diagnosis   Brilinta 90 mg, Oral, 2 times daily   furosemide (LASIX) 20 mg, Oral, Daily   gabapentin (NEURONTIN) 600 MG tablet 1 tablet, Oral, Daily   Insulin Pen Needle (UNIFINE PENTIPS) 31G X 5 MM MISC use with basaglar   metoprolol succinate (TOPROL-XL) 50 mg, Oral, 2 times daily, Take with or immediately following a meal.   nicotine (NICODERM CQ - DOSED IN MG/24 HOURS) 21 mg, Transdermal, Daily   nitroGLYCERIN (NITROSTAT) 0.4 mg, Sublingual, Every 5 min PRN   potassium chloride (KLOR-CON) 10 MEQ tablet 10 mEq, Oral, Daily   QUEtiapine (SEROQUEL) 200 mg, Oral, Daily at bedtime   ranolazine (RANEXA) 500 mg, Oral, 2 times daily     Review of Systems    She reports chest pain with radiation up her neck, shortness of breath, dyspnea, racing heart rate, palpitations, edema orthopnea, PND, snoring, anxiety, and stressors.  She denie n, v, dizziness, syncope, weight gain, or early satiety.  All other systems reviewed and are otherwise negative except as noted above.  Physical Exam    VS:  There were no vitals taken for this visit. , BMI There is no height or weight on file to calculate BMI. GEN: Well nourished, well developed, in no acute  distress.  Joined by her grandchild HEENT: normal. Neck: Supple, no JVD, carotid bruits, or masses. Cardiac: RRR, no murmurs, rubs, or gallops. No clubbing, cyanosis, mild lower extremity edema.  Radials/DP/PT 2+ and equal bilaterally.  Right radial arteriotomy site is without evidence of hematoma or infection.  She does have reduced right radial pulse at 1+ --Allen's test performed with sufficient blood [name] through her ulnar artery.  Radial pulse 2+. Respiratory:  Respirations regular and unlabored, clear to auscultation bilaterally. GI: Soft, nontender, nondistended, BS + x 4. MS: no deformity or atrophy. Skin: warm and dry, no rash. Neuro:  Strength and sensation are intact. Psych: Normal affect.  Accessory Clinical Findings    ECG personally reviewed by me today -NSR, 83 bpm, Q waves noted in the inferior leads/previous inferior infarct, poor R wave progression in the anterior leads/precordial leads- no acute changes.  VITALS Reviewed today   Temp Readings from Last 3 Encounters:  09/06/20 98.5 F (36.9 C) (Oral)  07/27/20 98 F (36.7 C) (Oral)  04/11/20 (!) 97.4 F (36.3 C) (Oral)   BP Readings from Last 3 Encounters:  09/14/20 138/80  09/06/20 95/76  07/27/20 121/76   Pulse Readings from Last 3 Encounters:  09/14/20 83  09/06/20 79  07/27/20 68    Wt Readings from Last 3 Encounters:  09/14/20 169 lb (76.7 kg)  09/04/20 161 lb (73 kg)  07/27/20 161 lb (73 kg)     LABS  reviewed today   Lab Results  Component Value Date   WBC 9.7 09/14/2020   HGB 12.6 09/14/2020   HCT 39.9 09/14/2020   MCV 97 09/14/2020   PLT 290 09/14/2020   Lab Results  Component Value Date   CREATININE 1.01 (H) 09/18/2020   BUN 27 (H) 09/18/2020   NA 134 (L) 09/18/2020   K 4.4 09/18/2020  CL 101 09/18/2020   CO2 23 09/18/2020   Lab Results  Component Value Date   ALT 20 09/14/2020   AST 16 09/14/2020   ALKPHOS 184 (H) 09/14/2020   BILITOT <0.2 09/14/2020   Lab Results   Component Value Date   CHOL 304 (H) 09/05/2020   HDL 55 09/05/2020   LDLCALC 227 (H) 09/05/2020   LDLDIRECT 183.5 (H) 07/27/2020   TRIG 108 09/05/2020   CHOLHDL 5.5 09/05/2020    Lab Results  Component Value Date   HGBA1C 14.3 (H) 09/05/2020   Lab Results  Component Value Date   TSH 0.266 (L) 02/23/2015     STUDIES/PROCEDURES reviewed today   2008:  --LHC s/p DES to RCA in 2008.    2010:  --MPI for recurrent CP 2010 showed prior inferior infarct, peri-infarct ischemia. --LHC showed 95%s mRCA, diffuse 60%s in the pRDPA. DES to [facility].    2016: Recurrent CP and transferred from [facility] to [facility].  --[place] 06/15/2014 with patent but small LM, mild dz LAD / branches, mild to moderate dz in LCx /branches, 90%s RCA s/p DES, LVEDP 28 mmHg.  --MPI 02/23/2015 for recurrent CP /palpitations, ruled intermediate risk study. --LHC 02/26/2015 with severe one-vessel CAD & severe ISR in the proximal segment of the previously placed stent in the dRCA.  DES to [facility] for ISR. Mildly elevated LVEDP. --Stress for nonexertional CP 03/19/2015 ruled low risk.   2018:  --Admitted to [facility] 07/2016 with UA/NSTEM, peak troponin 0 0.12. --07/2016 echo EF 60 to 65%, NRWMA. --07/2016 LHC with severe 1v CAD affecting the RCA with multiple previous stenting. There was felt to be a plaque rupture within the p-m stent, felt the culprit for her UA. DES to p-mRCA within the previously placed stent. 60% dz in the m-d segment, left to be treated medically. --Reported UA in office 02/18/2017 --02/19/2017 LHC D1 40%s, LCx minor irreg, OM1 60%s, p-m RCA 50-60%s with ISR in the proximal region, m-d RCA 50%, RPDA 90% filled by collaterals from third septal, LVEF 55 to 65%. Medical management advised. --02/24/2017 echo EF 55-60%, NRWMA.    2019:  --MPI with large region of inferior wall ischemia as well as inferior lateral wall ischemia, EF 50%, lateral wall hypokinesis, high risk scan. --LHC 06/09/2017 with severe single-vessel CAD with  focal 95% ISR of the mRCA where 2 layers of the stent were already placed. There was moderate stenosis of the m-d RCA (unchanged from prior cath) and high-grade dz involving the ostial/proximal PDA. Mild to moderate nonobstructive CAD involving the LCA. Mild elevation of LV filling pressure. She underwent successful PTCA to ISR of the mRCA with reduction of stenosis from 95% to 0%. Lifelong DAPT recommended.  03/2020 --NSTEMI with Cath showing severe single-vessel CAD with 100% occlusion of the proximal margin of distal RCA stent with thrombus embolization into the small RPL branches.  PCI with placement of overlapping stent to distal RCA.  No normal LV contraction with inferior hypokinesis EF 50 to 55% moderately elevated filling pressure with LVEDP 25 to 30 mmHg -- LVEF 55-60 with hypokinesis of basal inferior wall and moderate LVH, G1 DD  Echo 09/06/20  1. Left ventricular ejection fraction, by estimation, is 55 to 60 %. The  left ventricle has normal function. Unable to exclude hypokinesis of the  basal inferior wall. There is moderate left ventricular hypertrophy. Left  ventricular diastolic parameters  are consistent with Grade I diastolic dysfunction (impaired relaxation).   2. Right ventricular systolic function is normal.  The right ventricular  size is normal.   LHC 09/05/20 Conclusions: Severe single-vessel coronary artery disease with thrombotic 100% occlusion at the proximal margin of distal RCA stent with thrombus embolization into small RPL branches. Mild proximal/mid RCA in-stent restenosis and stable sequential 50-80% RPDA lesions. Nonobstructive CAD involving the left coronary artery, similar to prior catheterization in 03/2020. Low normal left ventricular contraction with inferior hypokinesis (LVEF 50-55%) and moderately elevated filling pressure (LVEDP 25-30 mmHg). Successful PCI to distal RCA with placement of overlapping Resolute Onyx 3.0 x 18 mm (proximal) and 3.0 x 15 mm  (distal) drug-eluting stents with 0% residual stenosis and TIMI-3 [name].  The majority of the RCA is now stented with the proximal and distal segments having 2 layers. Recommendations: Continue tirofiban infusion for 18 hours. Dual antiplatelet therapy with aspirin and ticagrelor for at least 12 months, ideally longer given extensive stenting in the RCA. Gentle diuresis; patient received furosemide 20 mg IV x1 at the end of the catheterization. Aggressive secondary prevention including improved blood pressure and glycemic control, as well as tobacco/marijuana cessation.  LHC 04/10/20 1st Diag lesion is 40% stenosed. 1st Mrg lesion is 25% stenosed. Non-stenotic Dist RCA lesion was previously treated. Mid RCA lesion is 40% stenosed. Ost RPDA to RPDA lesion is 80% stenosed. Prox RCA to Mid RCA lesion is 20% stenosed. 1.  Significant one-vessel coronary artery disease with patent stents in the proximal/mid RCA as well as distal RCA with mild in-stent restenosis.  Moderate stenosis in the mid to distal RCA and significant stenosis in a small diameter right PDA is unchanged from most recent catheterization and if anything the 2 lesions look better.  Mild disease affecting the left system. 2.  Left ventricular angiography was not performed.  EF was normal by echo.  Mildly elevated left ventricular end-diastolic pressure. Recommendations: I do not see a clear culprit for the patient's symptoms of unstable angina.  She does have small vessel disease but it actually appears better than most recent angiography in 2018.  I recommend continuing medical therapy.  I do suspect that anxiety is significantly contributing to her symptoms.  06/09/2017 LHC Conclusions: Severe single-vessel coronary artery disease with focal 95% in-stent restenosis of the mid RCA, where two layers of stent are already present. There is also moderate stenosis of the mid/distal RCA (unchanged from prior caths) and high grade disease  involving the ostial/proximal segment of the PDA (small vessel). Mild to moderate, nonobstructive CAD involving the left coronary artery. Mildly elevated left ventricular filling pressure. Successful balloon angioplasty to in-stent restenosis of the mid RCA with reduction of stenosis from 95% to 0%. Recommendations: Admit for overnight observation/extended recovery. Dual antiplatelet therapy with aspirin and clopidogrel through at least 07/2016, ideally lifelong. Aggressive secondary prevention, including smoking cessation. If in-stent restenosis recurs, referral for single vessel CABG, brrachytherapy, or drug-coated balloon angioplasty (not available in the [place]) would need to be considered.   02/2017 Echo - Left ventricle: The cavity size was normal. Systolic function was    normal. The estimated ejection fraction was in the range of 55%    to 60%. Wall motion was normal; there were no regional wall    motion abnormalities. Doppler parameters are consistent with    abnormal left ventricular relaxation (grade 1 diastolic    dysfunction).  - Right ventricle: Systolic function was normal.  - Pulmonary arteries: Systolic pressure was within the normal    range.    ------------------------------   STOP-BANG Rex assessment S-snore  Have you been told that you snore?   1  T-tired Are you often tired, fatigued, sleepy during the day?   1  O-obstruction Do you stop breathing, choke, or gasp during sleep?   1  P-pressure Do you have or are you being treated for high blood pressure?  1  B- BMI Is your BMI greater than 35 kg/m?  0  A- age Are you 109 years or older?  1  N-neck Do you have a neck circumference greater than 16 inches?  1  G-gender Are you female?   0  Total  6   Risk Score <3 Low risk of OSA ?3 High risk of OSA    Assessment & Plan     Recent inferior STEMI Coronary artery disease --No current chest pain. See HPI above for details.  Most recent cath  with nonobstructive left coronary artery disease and thrombotic occlusion of the distal RCA just proximal to the old distal RCA stent.  Significant thrombus was present with extension into the old distal RCA stent, as well as embolization into the RPL branches.  Placement of 2 overlapping DES was performed in the mid/distal RCA such that most of the RCA is now stented (2 layers of stent in the proximal and distal vessel).  She reports compliance with DAPT/medications.  Most recent echo as above with EF 55-60% and unable to exclude hypokinesis of the basal inferior wall. Continue DAPT with ASA and ticagrelor for at least 12 months, ideally longer in the setting of extensive stenting of the RCA. Aggressive secondary prevention. Praluent started.  BP and glycemic control stressed. Smoking cessation recommended.   ICM, diastolic dysfunction -- Echo as above with EF 55 to 60%, possible hypokinesis of the basal inferior wall, moderate LVH, G1DD.   Continue metoprolol succinate and lisinopril.   Continue Increased to furosemide 20 mg daily. Continue potassium 10 M EQ daily. BMET today.   Adjustment of furosemide and potassium pending results of BMET from today, if indicated.   HTN -- Continue Toprol, lisinopril, lasix.        Hyperlipidemia, goal LDL below 70 Hypertriglyceridemia --Poorly controlled on high intensity statin thus started on [id number] at previous visit.   Tobacco use/marijuana use/COPD --Complete cessation recommended. Discussed risks of continued smoking today. She reports that she desires to cut back on smoking, though difficult with current stressors.   Obesity --Heart healthy diet and increased exercise advised. Weight loss advised.   DM2, poorly controlled --Hemoglobin A1c of 14.6 and not well controlled. Per IM, PCP. Glycemic control recommended for risk factor modification.  Sleep disordered breathing --Reports sleep disordered breathing/snoring and waking up at night  choking. Watch Pat provided.  Preoperative evaluation --Given recent stenting and inability to disrupt DAPT, recommend against proceeding with any oral surgery at this time with patient understanding.     Medication changes:  Labs ordered:  Studies / Imaging ordered: WatchPat Future considerations: Lipid and liver function, BMET Disposition:   *Please be aware that the above documentation was completed voice recognition software and may contain dictation errors.      Arvil Chaco, PA-C 09/30/2020

## 2020-10-01 ENCOUNTER — Telehealth: Payer: Self-pay | Admitting: Licensed Clinical Social Worker

## 2020-10-01 ENCOUNTER — Ambulatory Visit: Payer: Medicare HMO | Admitting: Physician Assistant

## 2020-10-01 NOTE — Telephone Encounter (Signed)
Additional message left for pt regarding PCP referral. My colleague Eileen Stanford has also reached out to pt twice. Message left on voicemail at [phone].  Octavio Graves, MSW, LCSW [facility]ascular Care Navigation  [phone]

## 2020-10-18 ENCOUNTER — Other Ambulatory Visit: Payer: Self-pay

## 2020-10-25 ENCOUNTER — Ambulatory Visit: Payer: Medicare HMO | Admitting: Nurse Practitioner

## 2020-10-25 ENCOUNTER — Telehealth: Payer: Self-pay | Admitting: Physician Assistant

## 2020-10-25 NOTE — Telephone Encounter (Signed)
*  STAT* If patient is at the pharmacy, call can be transferred to refill team.   1. Which medications need to be refilled? (please list name of each medication and dose if known) brilinta 90 mg po BID  2. Which pharmacy/location (including street and city if local pharmacy) is medication to be sent to?  [id number] - [place], [place] - [address] AT [facility] [address] [facility] [address] [address]  [address] [place], [place] [address]   3. Do they need a 30 day or 90 day supply? 90

## 2020-10-26 MED ORDER — TICAGRELOR 90 MG PO TABS: 90.0000 mg | ORAL_TABLET | Freq: Two times a day (BID) | ORAL | 0 refills | Status: DC

## 2020-10-26 NOTE — Telephone Encounter (Signed)
Requested Prescriptions   Signed Prescriptions Disp Refills   ticagrelor (BRILINTA) 90 MG TABS tablet 180 tablet 0    Sig: Take 1 tablet (90 mg total) by mouth 2 (two) times daily.    Authorizing Provider: VISSER, JACQUELYN D    Ordering User: NEWCOMER MCCLAIN, Rebeccah Ivins L    

## 2020-10-29 ENCOUNTER — Telehealth: Payer: Self-pay | Admitting: Licensed Clinical Social Worker

## 2020-10-29 NOTE — Telephone Encounter (Signed)
Phone note opened in error. Had received message from [place], [place], inquiring if pt ever had engaged with our team. Unfortunately, despite multiple attempts and messages we had not been able to engage with or hear back from pt.  Remain available as needed.   Octavio Graves, MSW, LCSW [facility]/Vascular Care Navigation  [phone]

## 2020-10-29 NOTE — Telephone Encounter (Signed)
Phone note entered in error.   Octavio Graves, MSW, LCSW [facility]/Vascular Care Navigation  [phone]

## 2020-11-12 ENCOUNTER — Other Ambulatory Visit: Payer: Self-pay | Admitting: *Deleted

## 2020-11-26 ENCOUNTER — Other Ambulatory Visit: Payer: Self-pay | Admitting: Physician Assistant

## 2020-12-04 ENCOUNTER — Other Ambulatory Visit: Payer: Self-pay

## 2020-12-05 ENCOUNTER — Other Ambulatory Visit: Payer: Self-pay

## 2020-12-13 ENCOUNTER — Encounter: Payer: Self-pay | Admitting: Nurse Practitioner

## 2020-12-13 ENCOUNTER — Other Ambulatory Visit: Payer: Self-pay

## 2020-12-13 ENCOUNTER — Ambulatory Visit (INDEPENDENT_AMBULATORY_CARE_PROVIDER_SITE_OTHER): Payer: Medicare HMO | Admitting: Nurse Practitioner

## 2020-12-13 VITALS — BP 130/80 | HR 77 | Ht 62.0 in | Wt 172.4 lb

## 2020-12-13 DIAGNOSIS — I25118 Atherosclerotic heart disease of native coronary artery with other forms of angina pectoris: Secondary | ICD-10-CM | POA: Diagnosis not present

## 2020-12-13 DIAGNOSIS — Z720 Tobacco use: Secondary | ICD-10-CM

## 2020-12-13 DIAGNOSIS — E785 Hyperlipidemia, unspecified: Secondary | ICD-10-CM

## 2020-12-13 DIAGNOSIS — Z79899 Other long term (current) drug therapy: Secondary | ICD-10-CM

## 2020-12-13 DIAGNOSIS — I739 Peripheral vascular disease, unspecified: Secondary | ICD-10-CM | POA: Diagnosis not present

## 2020-12-13 DIAGNOSIS — E118 Type 2 diabetes mellitus with unspecified complications: Secondary | ICD-10-CM

## 2020-12-13 DIAGNOSIS — I10 Essential (primary) hypertension: Secondary | ICD-10-CM

## 2020-12-13 DIAGNOSIS — E1165 Type 2 diabetes mellitus with hyperglycemia: Secondary | ICD-10-CM

## 2020-12-13 MED ORDER — RANOLAZINE ER 1000 MG PO TB12
1000.0000 mg | ORAL_TABLET | Freq: Two times a day (BID) | ORAL | 6 refills | Status: DC
Start: 2020-12-13 — End: 2021-11-01

## 2020-12-13 NOTE — Patient Instructions (Addendum)
Medication Instructions:  - Your physician has recommended you make the following change in your medication:   1) INCREASE Ranexa to 1000 mg- take 1 tablet by mouth TWICE daily  2) PLEASE make sure you are taking the Brilinta 90 mg TWICE daily   *If you need a refill on your cardiac medications before your next appointment, please call your pharmacy*   Lab Work: - none ordered  If you have labs (blood work) drawn today and your tests are completely normal, you will receive your results only by: MyChart Message (if you have MyChart) OR A paper copy in the mail If you have any lab test that is abnormal or we need to change your treatment, we will call you to review the results.   Testing/Procedures:  1) ABI's/ Lower arterial duplex:  (This has to be ordered as a 2 part study to allow enough time to do a full ultrasound of you legs if the ABI's come back abnormal)  - Your physician has requested that you have an ankle brachial index (ABI). During this test an ultrasound and blood pressure cuff are used to evaluate the arteries that supply the arms and legs with blood. Allow thirty minutes for this exam. There are no restrictions or special instructions.  - Your physician has requested that you have a lower extremity arterial duplex. This test is an ultrasound of the arteries in the legs. It looks at arterial blood flow in the legs. Allow one hour for Lower Arterial scans. There are no restrictions or special instructions    Follow-Up: At [facility], you and your health needs are our priority.  As part of our continuing mission to provide you with exceptional heart care, we have created designated Provider Care Teams.  These Care Teams include your primary Cardiologist (physician) and Advanced Practice Providers (APPs -  Physician Assistants and Nurse Practitioners) who all work together to provide you with the care you need, when you need it.  We recommend signing up for the  patient portal called "MyChart".  Sign up information is provided on this After Visit Summary.  MyChart is used to connect with patients for Virtual Visits (Telemedicine).  Patients are able to view lab/test results, encounter notes, upcoming appointments, etc.  Non-urgent messages can be sent to your provider as well.   To learn more about what you can do with MyChart, go to [email or web address].    Your next appointment:   1 month(s)  The format for your next appointment:   In Person  Provider:   You may see Julien Nordmann, MD or one of the following Advanced Practice Providers on your designated Care Team:   Nicolasa Ducking, NP    Other Instructions  Ankle-Brachial Index Test Why am I having this test? The ankle-brachial index (ABI) test is used to diagnose peripheral vascular disease (PVD). PVD is also known as peripheral arterial disease (PAD). PVD is the blocking or hardening of the arteries anywhere within the circulatory system beyond the heart. It is a form of cardiovascular disease. PVD is caused by: Cholesterol deposits in your blood vessels (atherosclerosis). This is the most common cause of this condition. Inflammation in the blood vessels. Blood clots in the vessels. Cholesterol deposits cause arteries to narrow. Decreased delivery of oxygen to your tissues causes muscle pain, cramping, and fatigue that occurs duringexercise and is relieved by rest. This is called intermittent claudication. PVD means that there may also be buildup of cholesterol: In your heart. This raises  the risk of heart attacks. In your brain. This raises the risk of strokes. What is being tested? The ankle-brachial index test measures the blood flow in your arms and legs. The blood flow will show if blood vessels in your legs have been narrowed bycholesterol deposits. How do I prepare for this test? Wear loose, comfortable clothing with shoes that are easy to remove. Do not use any products that  contain nicotine or tobacco for at least 30 minutes before the test. These products include cigarettes, chewing tobacco, and vaping devices, such as e-cigarettes. Avoid caffeine, alcohol, and heavy activity an hour before the test. What happens during the test?  You will lie down in a resting position with your legs and arms at heart level. Your health care provider will use a blood pressure machine and a small ultrasound device (Doppler) to measure the systolic pressures on your upper arms and ankles. Systolic pressure is the pressure inside your arteries when your heart pumps. Systolic pressures will be taken several times, and at several points, on both the ankle and the arm. Your health care provider may also measure your toe. Your health care provider will divide the highest systolic pressure of the ankle by the highest systolic pressure of the arm. The result is the ankle-brachial pressure ratio, or ABI. Sometimes this test will be repeated after you have exercised on a treadmill for 5 minutes. You may have leg pain during the exercise portion of the test if you suffer from PVD. If the index number drops after exercise, this may show that PVD is present. How are the results reported? Your test results will be reported as a value that shows the ratio of your ankle pressure to your arm pressure (ABI ratio). Your health care provider will compare your results to normal ranges that were established after testing a large group of people (reference ranges). These ranges may vary among labs and hospitals. For this test, an abnormal reading is typically considered, which is an ABIratio of 0.9 or less. What do the results mean? An ABI ratio that is below the reference range is considered abnormal and mayindicate PVD in the legs. Talk with your health care provider about what your results mean. Questions to ask your health care provider Ask your health care provider, or the department that is doing the  test: When will my results be ready? How will I get my results? What are my treatment options? What other tests do I need? What are my next steps? Summary The ankle-brachial index (ABI) test is used to diagnose peripheral vascular disease (PVD). PVD is also known as peripheral arterial disease (PAD). The ABI test measures the blood flow in your arms and legs. The highest systolic pressure of the ankle is divided by the highest systolic pressure of the arm. The result is the ABI ratio. An ABI ratio of 0.9 or less is considered abnormal and may indicate PVD in the legs. This information is not intended to replace advice given to you by your health care provider. Make sure you discuss any questions you have with your healthcare provider. Document Revised: 10/10/2019 Document Reviewed: 10/10/2019 Elsevier Patient Education  2022 [employer].

## 2020-12-13 NOTE — Progress Notes (Signed)
Office Visit    Patient Name: Jennifer [name] Date of Encounter: 12/13/2020  Primary Care Provider:  Mateo Flow, MD Primary Cardiologist:  [name], MD  Chief Complaint    60 year old female with a history of CAD status post multiple right coronary artery interventions, hypertension, hyperlipidemia, depression, anxiety, bipolar disorder, diabetes, tobacco abuse, who presents for follow-up related to CAD.  Past Medical History    Past Medical History:  Diagnosis Date   Anxiety    Arthritis    "qwhere" (06/14/2014)   Bipolar disorder ([place])    CAD (coronary artery disease)    a. 2008 DES->RCA; b. 2012 PCI: 53mRCA s/p DES; b. 05/2014 DES-> 90 dRCA; c. 11/16 PCI: dRCA ISR->DES;  d. 4/18 PCI: p-mRCA 95% s/p PCI/DES; e. 02/2017 Cath: patent dRCA stent, RPDA 90ost->Med Rx; f. 05/2017 PTCA mRCA 2/2 ISR; g. 05/2019 Cath: stable dzs; f. 08/2020 PCI: LM nl, LAD nl, D1 40, LCX 60p/m, OM1 50, RCA 20p/m, 135m/d (3.0x18 & 3.0x15 Resolute Onyx DESs), PDA 80, RPDA 80, RPAV 100. EF 50-55.   Chronic bronchitis ([place])    "get it q yr"   Daily headache    "when my blood pressure is up" (06/14/2014)   Depression    Diastolic dysfunction    a. 02/2017 Echo: EF 55-60%, no rwma, Gr1 DD, nl RV fxn.   Diastolic dysfunction    a. 08/2020 Echo: EF 55-60%, mod LVH, grI DD. NL RV fxn.   GERD (gastroesophageal reflux disease)    Heart murmur    High cholesterol    History of stomach ulcers    Hypertension    Insomnia    Kidney stones    Migraine    "haven't had them in a good while; did get them 1-2 times/yr" (06/14/2014)   Myocardial infarction ([place]) 2008; ~ 2012   Pneumonia    "get it 1-2 times/year" (06/14/2014)   RLS (restless legs syndrome)    Seizures ([place])    Sleep apnea    "they want me to do the lab but I haven't" (06/14/2014)   Stroke [facility])    "they say I've had several" (06/14/2014)   Tobacco abuse    Type II diabetes mellitus (H[facility])    Urinary, incontinence, stress female    Past  Surgical History:  Procedure Laterality Date   ABDOMINAL HYSTERECTOMY  1984   "I've got 1 ovary left"   CARDIAC CATHETERIZATION N/A 02/26/2015   Procedure: Left Heart Cath and Coronary Angiography;  Surgeon: Wellington Hampshire, MD;  Location: [place] CV LAB;  Service: Cardiovascular;  Laterality: N/A;   CARDIAC CATHETERIZATION N/A 02/26/2015   Procedure: Coronary Stent Intervention;  Surgeon: Wellington Hampshire, MD;  Location: [place] CV LAB;  Service: Cardiovascular;  Laterality: N/A;   CESAREAN SECTION  1984   CORONARY ANGIOPLASTY WITH STENT PLACEMENT  2008; ~ 2012   "1; 1"   CORONARY BALLOON ANGIOPLASTY N/A 06/09/2017   Procedure: CORONARY BALLOON ANGIOPLASTY;  Surgeon: Nelva Bush, MD;  Location: [place] CV LAB;  Service: Cardiovascular;  Laterality: N/A;   CORONARY STENT INTERVENTION N/A 08/04/2016   Procedure: Coronary Stent Intervention;  Surgeon: Wellington Hampshire, MD;  Location: [place] CV LAB;  Service: Cardiovascular;  Laterality: N/A;   CORONARY/GRAFT ACUTE MI REVASCULARIZATION N/A 09/05/2020   Procedure: Coronary/Graft Acute MI Revascularization;  Surgeon: Nelva Bush, MD;  Location: [place] CV LAB;  Service: Cardiovascular;  Laterality: N/A;   DILATION AND CURETTAGE OF UTERUS  EXTRACORPOREAL SHOCK WAVE LITHOTRIPSY  X 2   I & D EXTREMITY Left 02/09/2018   Procedure: IRRIGATION AND DEBRIDEMENT EXTREMITY;  Surgeon: Herbert Pun, MD;  Location: [facility] ORS;  Service: General;  Laterality: Left;   KNEE ARTHROSCOPY Left    LEFT HEART CATH Bilateral 08/04/2016   Procedure: Left Heart Cath poss PCI;  Surgeon: Wellington Hampshire, MD;  Location: [place] [facility];  Service: Cardiovascular;  Laterality: Bilateral;   LEFT HEART CATH AND CORONARY ANGIOGRAPHY Left 02/19/2017   Procedure: LEFT HEART CATH AND CORONARY ANGIOGRAPHY;  Surgeon: Minna Merritts, MD;  Location: [place] [facility];  Service: Cardiovascular;  Laterality: Left;   LEFT HEART  CATH AND CORONARY ANGIOGRAPHY Left 06/09/2017   Procedure: LEFT HEART CATH AND CORONARY ANGIOGRAPHY;  Surgeon: Nelva Bush, MD;  Location: [place] [facility] LAB;  Service: Cardiovascular;  Laterality: Left;   LEFT HEART CATH AND CORONARY ANGIOGRAPHY N/A 04/10/2020   Procedure: LEFT HEART CATH AND CORONARY ANGIOGRAPHY;  Surgeon: Wellington Hampshire, MD;  Location: [place] [facility];  Service: Cardiovascular;  Laterality: N/A;   LEFT HEART CATH AND CORONARY ANGIOGRAPHY N/A 09/05/2020   Procedure: LEFT HEART CATH AND CORONARY ANGIOGRAPHY;  Surgeon: Nelva Bush, MD;  Location: [place] [facility];  Service: Cardiovascular;  Laterality: N/A;   LEFT HEART CATHETERIZATION WITH CORONARY ANGIOGRAM N/A 06/15/2014   Procedure: LEFT HEART CATHETERIZATION WITH CORONARY ANGIOGRAM;  Surgeon: Jettie Booze, MD;  Location: [facility] CATH LAB;  Service: Cardiovascular;  Laterality: N/A;   PERCUTANEOUS CORONARY STENT INTERVENTION (PCI-S)  06/15/2014   Procedure: PERCUTANEOUS CORONARY STENT INTERVENTION (PCI-S);  Surgeon: Jettie Booze, MD;  Location: [facility] CATH LAB;  Service: Cardiovascular;;   TONSILLECTOMY     TUBAL LIGATION      Allergies  Allergies  Allergen Reactions   Buprenorphine Hcl     Nausea and vomiting   Carbamazepine Anaphylaxis and Other (See Comments)    Blood dyscrasia when on with Lithium   Lithium Other (See Comments)    Blood dyscrasia   Lodine [Etodolac] Shortness Of Breath, Diarrhea, Nausea And Vomiting and Rash   Methocarbamol Other (See Comments) and Shortness Of Breath    Unknown Rash, tachypnea   Codeine Other (See Comments)    Upset stomach   Levofloxacin In D5w Swelling and Other (See Comments)    "Salty" sensation in mouth. "Hard time to explain it"   Tape Other (See Comments)    Welps, blister on skin   Wound Dressing Adhesive    Relafen [Nabumetone] Diarrhea, Nausea And Vomiting and Rash    History of Present Illness    60 year old female with the above  complex past medical history including CAD status post multiple PCI's and drug-eluting stents to the right coronary artery in the setting of recurrent in-stent restenosis dating back to 2008.  Other history includes hypertension, hyperlipidemia, diabetes, obesity, tobacco abuse, depression, anxiety, and bipolar disorder.  Most recent echo in May 2022 showed an EF of 55 to 60% with moderate LVH and grade 1 diastolic dysfunction.  Diagnostic catheterization performed in the setting of recurrent chest pain in May showed an occluded distal RCA with thrombus into the small RPL branches.  Otherwise, she had nonobstructive disease which was similar to catheterization in December 2021.  EF was 50 to 55%.  The mid to distal RCA was treated with 2 overlapping drug-eluting stents, with recommendation for prolonged dual antiplatelet therapy.  At most recent follow-up on May 27, she reported somewhat atypical chest  discomfort that was not felt to be consistent with angina.  She also reported sleep disordered breathing and a sleep study was ordered.    Since her last visit, she has continued to have intermittent chest discomfort roughly every other day.  This is sometimes associated with dyspnea and radiates to the jaw.  This typically occurs at rest, lasts a few minutes, resolve spontaneously.  She notes that she has significant anxiety and home life stress in the setting of having multiple children and grandchildren that she is responsible for.  She continues to smoke about a half a pack a day.  She has yet to follow-up with psychiatry noting that she simply has not had the time.  Yesterday, she was in her home and resting when she had an episode of chest discomfort with radiation to her jaw.  She took a sublingual nitroglycerin with relief within 10 minutes.  On further questioning today, she notes that for at least several weeks, she has been taking Brilinta just once a day.  We discussed the importance of taking it twice a  day as prescribed.  She denies palpitations, PND, orthopnea, dizziness, syncope, edema, or early satiety.  Finally, she reports bilateral lower extremity claudication.  This is been going on for a long time.  She has trouble walking around grocery stores because her legs hurt and become weak and sometimes numb.  Home Medications    Current Outpatient Medications  Medication Sig Dispense Refill   albuterol (PROVENTIL HFA;VENTOLIN HFA) 108 (90 Base) MCG/ACT inhaler Inhale 2 puffs into the lungs every 4 (four) hours as needed for wheezing or shortness of breath.     Alirocumab (PRALUENT) 150 MG/ML SOAJ Inject 1 pen into the skin every 14 (fourteen) days. 2 mL 11   aspirin 81 MG EC tablet Take 1 tablet (81 mg total) by mouth daily. 30 tablet 2   atorvastatin (LIPITOR) 80 MG tablet Take 1 tablet (80 mg total) by mouth daily. 30 tablet 3   blood glucose meter kit and supplies KIT Dispense based on patient and insurance preference. Use up to four times daily as directed. (FOR ICD-9 250.00, 250.01). 1 each 0   Blood Glucose Monitoring Suppl W/DEVICE KIT Test 1-2 times daily; E11.65 diagnosis 1 each 0   furosemide (LASIX) 20 MG tablet Take 1 tablet (20 mg total) by mouth daily. 30 tablet 5   gabapentin (NEURONTIN) 600 MG tablet Take 1.5 mg by mouth 3 (three) times daily.     Insulin Glargine (BASAGLAR KWIKPEN) 100 UNIT/ML Inject 40 Units into the skin daily. 15 mL 11   Insulin Pen Needle (UNIFINE PENTIPS) 31G X 5 MM MISC use with basaglar 100 each 12   metoprolol succinate (TOPROL-XL) 50 MG 24 hr tablet Take 1 tablet (50 mg total) by mouth in the morning and at bedtime. Take with or immediately following a meal. 60 tablet 3   nitroGLYCERIN (NITROSTAT) 0.4 MG SL tablet Place 1 tablet (0.4 mg total) under the tongue every 5 (five) minutes as needed for chest pain. 25 tablet 12   pantoprazole (PROTONIX) 40 MG tablet Take 40 mg by mouth daily.     ranolazine (RANEXA) 1000 MG SR tablet Take 1 tablet (1,000 mg  total) by mouth 2 (two) times daily. 60 tablet 6   ticagrelor (BRILINTA) 90 MG TABS tablet Take 1 tablet (90 mg total) by mouth 2 (two) times daily. 180 tablet 0   tiZANidine (ZANAFLEX) 4 MG tablet Take 4 mg by mouth at  bedtime.     nicotine (NICODERM CQ - DOSED IN MG/24 HOURS) 21 mg/24hr patch Place 1 patch (21 mg total) onto the skin daily. (Patient not taking: Reported on 12/13/2020) 28 patch 0   QUEtiapine (SEROQUEL) 200 MG tablet Take 1 tablet (200 mg total) by mouth at bedtime. (Patient not taking: Reported on 12/13/2020) 30 tablet 0   No current facility-administered medications for this visit.     Review of Systems    Ongoing intermittent chest discomfort, often in the setting of anxiety, since her PCI in May.  She continues have dyspnea on exertion and still smokes half a pack a day.  She has bilateral lower extremity claudication when walking.  She denies palpitations, PND, orthopnea, dizziness, syncope, edema, or early satiety.  All other systems reviewed and are otherwise negative except as noted above.  Physical Exam    VS:  BP 130/80 (BP Location: Left Arm, Patient Position: Sitting, Cuff Size: Normal)   Pulse 77   Ht 5\' 2"  (1.575 m)   Wt 172 lb 6 oz (78.2 kg)   SpO2 96%   BMI 31.53 kg/m  , BMI Body mass index is 31.53 kg/m.     GEN: Well nourished, well developed, in no acute distress. HEENT: normal. Neck: Supple, no JVD, carotid bruits, or masses. Cardiac: RRR, no murmurs, rubs, or gallops. No clubbing, cyanosis, edema.  DP/PT diminished bilaterally.  Respiratory:  Respirations regular and unlabored, coarse breath sounds @ bilat bases but otw clear to auscultation bilaterally. GI: Soft, nontender, nondistended, BS + x 4. MS: no deformity or atrophy. Skin: warm and dry, no rash. Neuro:  Strength and sensation are intact. Psych: Normal affect.  Accessory Clinical Findings    ECG personally reviewed by me today -regular sinus rhythm, 77, prior inferior infarct- no  acute changes.  Lab Results  Component Value Date   WBC 9.7 09/14/2020   HGB 12.6 09/14/2020   HCT 39.9 09/14/2020   MCV 97 09/14/2020   PLT 290 09/14/2020   Lab Results  Component Value Date   CREATININE 1.01 (H) 09/18/2020   BUN 27 (H) 09/18/2020   NA 134 (L) 09/18/2020   K 4.4 09/18/2020   CL 101 09/18/2020   CO2 23 09/18/2020   Lab Results  Component Value Date   ALT 20 09/14/2020   AST 16 09/14/2020   ALKPHOS 184 (H) 09/14/2020   BILITOT <0.2 09/14/2020   Lab Results  Component Value Date   CHOL 304 (H) 09/05/2020   HDL 55 09/05/2020   LDLCALC 227 (H) 09/05/2020   LDLDIRECT 183.5 (H) 07/27/2020   TRIG 108 09/05/2020   CHOLHDL 5.5 09/05/2020    Lab Results  Component Value Date   HGBA1C 14.3 (H) 09/05/2020    Assessment & Plan    1.  Coronary artery disease with ongoing angina: Patient is status post multiple right coronary artery intervention in the setting of in-stent restenosis, the most recent of which occurred in May, at which time 2 drug-eluting stents were placed.  She has continued to have intermittent chest discomfort ever since that procedure and notes that generally, this occurs with anxiety.  Yesterday, she had an episode that was a little more severe and nitrate responsive.  Outside of yesterday's episodes, most are fairly mild and brief.  She mentions today that at least for the past few weeks, she has been taking Brilinta just once a day.  She is not sure when she stopped taking it twice a day.  We discussed options for management and at this point, she would like to avoid repeat catheterization.  I increase her Ranexa to 1000 mg twice daily and advised that she is to take Brilinta twice daily as prescribed.  She was previously intolerant to long-acting nitrates.  She otherwise remains on aspirin, statin, and beta-blocker therapy.  She knows to contact us if chest pain symptoms worsen or become more similar to prior angina, at which point she would require  relook catheterization.  2.  Lower extremity claudication: Patient notes that she has been having leg/muscle pain associated with weakness for a long time.  She has a lot of trouble when walking around the grocery store because of discomfort in her calves, hips, and buttocks.  She has diminished distal pulses.  I will arrange for ABIs.  She remains on aspirin and statin therapy.  Smoking cessation advised.  3.  Essential hypertension: Relatively stable on current regimen.  4.  Hyperlipidemia: Most recent LDL was 227 in May.  She is now on Lipitor 80 mg and Praluent, which she was likely noncompliant with in the past.  She is not fasting today.  We will plan to repeat lipids and LFTs at follow-up in 1 month.  5.  Type 2 diabetes mellitus: Poorly controlled by A1c of 14.3 in May.  Insulin management per primary care.  6.  Tobacco abuse: Still smoking half a pack a day.  Complete cessation advised.  7.  Bipolar disorder/depression/anxiety: Patient knows that she needs to follow-up with psychiatry and says that she has not had time up to this point.  This is currently managed by primary care.  8.  Disposition: Follow-up in clinic in 1 month or sooner if necessary.  Will need lipids and LFTs at next visit.   [name] Hodgkins, NP 12/13/2020, 5:38 PM

## 2021-01-15 ENCOUNTER — Other Ambulatory Visit: Payer: Self-pay

## 2021-01-15 ENCOUNTER — Ambulatory Visit (INDEPENDENT_AMBULATORY_CARE_PROVIDER_SITE_OTHER): Payer: Medicare HMO

## 2021-01-15 DIAGNOSIS — I739 Peripheral vascular disease, unspecified: Secondary | ICD-10-CM

## 2021-01-17 ENCOUNTER — Ambulatory Visit: Payer: Medicare HMO | Admitting: Nurse Practitioner

## 2021-01-23 ENCOUNTER — Ambulatory Visit (INDEPENDENT_AMBULATORY_CARE_PROVIDER_SITE_OTHER): Payer: Medicare HMO

## 2021-01-23 ENCOUNTER — Other Ambulatory Visit: Payer: Self-pay

## 2021-01-23 DIAGNOSIS — I739 Peripheral vascular disease, unspecified: Secondary | ICD-10-CM | POA: Diagnosis not present

## 2021-01-29 ENCOUNTER — Ambulatory Visit (INDEPENDENT_AMBULATORY_CARE_PROVIDER_SITE_OTHER): Payer: Medicare HMO | Admitting: Cardiovascular Disease

## 2021-01-29 ENCOUNTER — Other Ambulatory Visit: Payer: Self-pay

## 2021-01-29 ENCOUNTER — Encounter: Payer: Self-pay | Admitting: Cardiovascular Disease

## 2021-01-29 VITALS — BP 138/90 | HR 72 | Ht 62.0 in | Wt 167.4 lb

## 2021-01-29 DIAGNOSIS — I10 Essential (primary) hypertension: Secondary | ICD-10-CM | POA: Diagnosis not present

## 2021-01-29 DIAGNOSIS — Z720 Tobacco use: Secondary | ICD-10-CM

## 2021-01-29 DIAGNOSIS — I25118 Atherosclerotic heart disease of native coronary artery with other forms of angina pectoris: Secondary | ICD-10-CM

## 2021-01-29 DIAGNOSIS — E785 Hyperlipidemia, unspecified: Secondary | ICD-10-CM | POA: Diagnosis not present

## 2021-01-29 DIAGNOSIS — I739 Peripheral vascular disease, unspecified: Secondary | ICD-10-CM

## 2021-01-29 NOTE — Patient Instructions (Signed)
Medication Instructions:  Your physician recommends that you continue on your current medications as directed. Please refer to the Current Medication list given to you today.  *If you need a refill on your cardiac medications before your next appointment, please call your pharmacy*   Lab Work: None ordered If you have labs (blood work) drawn today and your tests are completely normal, you will receive your results only by: MyChart Message (if you have MyChart) OR A paper copy in the mail If you have any lab test that is abnormal or we need to change your treatment, we will call you to review the results.   Testing/Procedures: None ordered   Follow-Up: At [facility], you and your health needs are our priority.  As part of our continuing mission to provide you with exceptional heart care, we have created designated Provider Care Teams.  These Care Teams include your primary Cardiologist (physician) and Advanced Practice Providers (APPs -  Physician Assistants and Nurse Practitioners) who all work together to provide you with the care you need, when you need it.  We recommend signing up for the patient portal called "MyChart".  Sign up information is provided on this After Visit Summary.  MyChart is used to connect with patients for Virtual Visits (Telemedicine).  Patients are able to view lab/test results, encounter notes, upcoming appointments, etc.  Non-urgent messages can be sent to your provider as well.   To learn more about what you can do with MyChart, go to [email or web address].    Your next appointment:   3 month(s)  The format for your next appointment:   In Person  Provider:   [name], MD   Other Instructions  EXERCISE PROGRAM FOR INDIVIDUALS WITH  PERIPHERAL ARTERIAL DISEASE (PAD)   General Information:   Research in vascular exercise has demonstrated remarkable improvement in symptoms of leg pain (claudication) without expensive or invasive  interventions. Regular walking programs are extremely helpful for patients with PAD and intermittent claudication.  These steps are designed to help you get started with a safe and effective program to help you walk farther with less pain:   Walk at least three times a week (preferably every day).  Your goal is to build up to 30-45 minutes of total walking time (not counting rest breaks). It may take you several weeks to build up your exercise time starting at 5-10 minutes or whatever you can tolerate.  Walk as far as possible using moderate to maximal pain (7-8 on the scale below) as a signal to stop, and resume walking when the pain goes away.  On a treadmill, set the speed and grade at a level that brings on the claudication pain within 3 to 5 minutes. Walk at this rate until you experience claudication of moderate severity, rest until the pain improves, and then resume walking.  Over time, you will be able to walk longer at the designated speed and grade; workload should then be increased until you develop the pain within 3 to 5 minutes once again.  This regimen will induce a significant benefit. Studies have demonstrated that participants may be able to walk up to three or four times farther and have less leg pain, within twelve weeks, by following this protocol.  Pain Scale    0_____1_____2_____3_____4_____5_____6_____7_____8_____9_____10   No Pain  Moderate Pain                               Maximal Pain

## 2021-01-29 NOTE — Progress Notes (Signed)
Cardiology Office Note   Date:  01/29/2021   ID:  [name], DOB 07-19-1960, MRN [id number]  PCP:  Althea Grimmer, MD  Cardiologist:  Dr. Mariah Milling  Chief Complaint  Patient presents with   Other    1 month f/u c/o sob, leg pain and pt stopped lasix x3 days now. Meds reviewed verbally with pt.      History of Present Illness: Jennifer [name] is a 60 y.o. female who was referred by Jennifer [name] for evaluation of peripheral arterial disease. She has known history of coronary artery disease status post multiple blood coronary artery PCI, essential hypertension, hyperlipidemia, depression, anxiety, bipolar disorder, diabetes mellitus and tobacco use.  She reports bilateral leg discomfort with walking which seems to be worse on the left than the right side.  It affects her feet and calves.  This happens after walking about 100 feet.  She occasionally has discomfort at night when she is trying to rest. She underwent noninvasive vascular studies last month which showed an ABI of 0.99 on the right and 0.76 on the left.  Duplex on the right showed moderate common femoral artery stenosis and no significant infrainguinal disease.  On the left, there was borderline significant stenosis in the common femoral artery and borderline significant stenosis in the proximal popliteal artery.  She is trying to quit smoking and cut down to 5 cigarettes a day.  She has chronic atypical angina and chronic shortness of breath.   Past Medical History:  Diagnosis Date   Anxiety    Arthritis    "qwhere" (06/14/2014)   Bipolar disorder (HCC)    CAD (coronary artery disease)    a. 2008 DES->RCA; b. 2012 PCI: s/p DES; b. 05/2014 DES-> 90 dRCA; c. 11/16 PCI: dRCA ISR->DES;  d. 4/18 PCI: p-mRCA 95% s/p PCI/DES; e. 02/2017 Cath: patent dRCA stent, RPDA 90ost->Med Rx; f. 05/2017 PTCA mRCA 2/2 ISR; g. 05/2019 Cath: stable dzs; f. 08/2020 PCI: LM nl, LAD nl, D1 40, LCX 60p/m, OM1 50, RCA 20p/m, 129m/d (3.0x18  & 3.0x15 Resolute Onyx DESs), PDA 80, RPDA 80, RPAV 100. EF 50-55.   Chronic bronchitis (HCC)    "get it q yr"   Daily headache    "when my blood pressure is up" (06/14/2014)   Depression    Diastolic dysfunction    a. 02/2017 Echo: EF 55-60%, no rwma, Gr1 DD, nl RV fxn.   Diastolic dysfunction    a. 08/2020 Echo: EF 55-60%, mod LVH, grI DD. NL RV fxn.   GERD (gastroesophageal reflux disease)    Heart murmur    High cholesterol    History of stomach ulcers    Hypertension    Insomnia    Kidney stones    Migraine    "haven't had them in a good while; did get them 1-2 times/yr" (06/14/2014)   Myocardial infarction (HCC) 2008; ~ 2012   Pneumonia    "get it 1-2 times/year" (06/14/2014)   RLS (restless legs syndrome)    Seizures (HCC)    Sleep apnea    "they want me to do the lab but I haven't" (06/14/2014)   Stroke [facility])    "they say I've had several" (06/14/2014)   Tobacco abuse    Type II diabetes mellitus (HCC)    Urinary, incontinence, stress female     Past Surgical History:  Procedure Laterality Date   ABDOMINAL HYSTERECTOMY  1984   "I've got 1 ovary left"   CARDIAC  CATHETERIZATION N/A 02/26/2015   Procedure: Left Heart Cath and Coronary Angiography;  Surgeon: Wellington Hampshire, MD;  Location: [place] [facility];  Service: Cardiovascular;  Laterality: N/A;   CARDIAC CATHETERIZATION N/A 02/26/2015   Procedure: Coronary Stent Intervention;  Surgeon: Wellington Hampshire, MD;  Location: [place] [facility];  Service: Cardiovascular;  Laterality: N/A;   CESAREAN SECTION  1984   CORONARY ANGIOPLASTY WITH STENT PLACEMENT  2008; ~ 2012   "1; 1"   CORONARY BALLOON ANGIOPLASTY N/A 06/09/2017   Procedure: CORONARY BALLOON ANGIOPLASTY;  Surgeon: Nelva Bush, MD;  Location: [place] [facility];  Service: Cardiovascular;  Laterality: N/A;   CORONARY STENT INTERVENTION N/A 08/04/2016   Procedure: Coronary Stent Intervention;  Surgeon: Wellington Hampshire, MD;  Location: [place] [facility]  [facility];  Service: Cardiovascular;  Laterality: N/A;   CORONARY/GRAFT ACUTE MI REVASCULARIZATION N/A 09/05/2020   Procedure: Coronary/Graft Acute MI Revascularization;  Surgeon: Nelva Bush, MD;  Location: [place] [facility];  Service: Cardiovascular;  Laterality: N/A;   DILATION AND CURETTAGE OF UTERUS     EXTRACORPOREAL SHOCK WAVE LITHOTRIPSY  X 2   I & D EXTREMITY Left 02/09/2018   Procedure: IRRIGATION AND DEBRIDEMENT EXTREMITY;  Surgeon: Herbert Pun, MD;  Location: [facility];  Service: General;  Laterality: Left;   KNEE ARTHROSCOPY Left    LEFT HEART CATH Bilateral 08/04/2016   Procedure: Left Heart Cath poss PCI;  Surgeon: Wellington Hampshire, MD;  Location: [place] [facility];  Service: Cardiovascular;  Laterality: Bilateral;   LEFT HEART CATH AND CORONARY ANGIOGRAPHY Left 02/19/2017   Procedure: LEFT HEART CATH AND CORONARY ANGIOGRAPHY;  Surgeon: Minna Merritts, MD;  Location: [place] [facility];  Service: Cardiovascular;  Laterality: Left;   LEFT HEART CATH AND CORONARY ANGIOGRAPHY Left 06/09/2017   Procedure: LEFT HEART CATH AND CORONARY ANGIOGRAPHY;  Surgeon: Nelva Bush, MD;  Location: [place] [facility];  Service: Cardiovascular;  Laterality: Left;   LEFT HEART CATH AND CORONARY ANGIOGRAPHY N/A 04/10/2020   Procedure: LEFT HEART CATH AND CORONARY ANGIOGRAPHY;  Surgeon: Wellington Hampshire, MD;  Location: [place] [facility];  Service: Cardiovascular;  Laterality: N/A;   LEFT HEART CATH AND CORONARY ANGIOGRAPHY N/A 09/05/2020   Procedure: LEFT HEART CATH AND CORONARY ANGIOGRAPHY;  Surgeon: Nelva Bush, MD;  Location: [place] [facility];  Service: Cardiovascular;  Laterality: N/A;   LEFT HEART CATHETERIZATION WITH CORONARY ANGIOGRAM N/A 06/15/2014   Procedure: LEFT HEART CATHETERIZATION WITH CORONARY ANGIOGRAM;  Surgeon: Jettie Booze, MD;  Location: [facility] C[facility] LAB;  Service: Cardiovascular;  Laterality: N/A;   PERCUTANEOUS CORONARY STENT INTERVENTION (PCI-S)   06/15/2014   Procedure: PERCUTANEOUS CORONARY STENT INTERVENTION (PCI-S);  Surgeon: Jettie Booze, MD;  Location[facility] C[facility] LAB;  Service: Cardiovascular;;   TONSILLECTOMY     TUBAL LIGATION       Current Outpatient Medications  Medication Sig Dispense Refill   albuterol (PROVENTIL HFA;VENTOLIN HFA) 108 (90 Base) MCG/ACT inhaler Inhale 2 puffs into the lungs every 4 (four) hours as needed for wheezing or shortness of breath.     Alirocumab (PRALUENT) 150 MG/ML SOAJ Inject 1 pen into the skin every 14 (fourteen) days. 2 mL 11   atorvastatin (LIPITOR) 80 MG tablet Take 1 tablet (80 mg total) by mouth daily. 30 tablet 3   blood glucose meter kit and supplies KIT Dispense based on patient and insurance preference. Use up to four times daily as directed. (FOR ICD-9 250.00, 250.01). 1 each 0  Blood Glucose Monitoring Suppl W/DEVICE KIT Test 1-2 times daily; E11.65 diagnosis 1 each 0   gabapentin (NEURONTIN) 600 MG tablet Take 1.5 mg by mouth 3 (three) times daily.     Insulin Glargine (BASAGLAR KWIKPEN) 100 UNIT/ML Inject 40 Units into the skin daily. 15 mL 11   Insulin Pen Needle (UNIFINE PENTIPS) 31G X 5 MM MISC use with basaglar 100 each 12   metoprolol succinate (TOPROL-XL) 50 MG 24 hr tablet Take 1 tablet (50 mg total) by mouth in the morning and at bedtime. Take with or immediately following a meal. 60 tablet 3   nitroGLYCERIN (NITROSTAT) 0.4 MG SL tablet Place 1 tablet (0.4 mg total) under the tongue every 5 (five) minutes as needed for chest pain. 25 tablet 12   pantoprazole (PROTONIX) 40 MG tablet Take 40 mg by mouth daily.     QUEtiapine (SEROQUEL) 200 MG tablet Take 1 tablet (200 mg total) by mouth at bedtime. 30 tablet 0   ranolazine (RANEXA) 1000 MG SR tablet Take 1 tablet (1,000 mg total) by mouth 2 (two) times daily. 60 tablet 6   ticagrelor (BRILINTA) 90 MG TABS tablet Take 1 tablet (90 mg total) by mouth 2 (two) times daily. 180 tablet 0   tiZANidine (ZANAFLEX) 4 MG tablet Take  4 mg by mouth at bedtime.     aspirin 81 MG EC tablet Take 1 tablet (81 mg total) by mouth daily. (Patient not taking: Reported on 01/29/2021) 30 tablet 2   furosemide (LASIX) 20 MG tablet Take 1 tablet (20 mg total) by mouth daily. (Patient not taking: Reported on 01/29/2021) 30 tablet 5   nicotine (NICODERM CQ - DOSED IN MG/24 HOURS) 21 mg/24hr patch Place 1 patch (21 mg total) onto the skin daily. (Patient not taking: No sig reported) 28 patch 0   No current facility-administered medications for this visit.    Allergies:   Buprenorphine hcl, Carbamazepine, Lithium, Lodine [etodolac], Methocarbamol, Codeine, Levofloxacin in d5w, Tape, Wound dressing adhesive, and Relafen [nabumetone]    Social History:  The patient  reports that she has been smoking cigarettes. She has a 8.75 pack-year smoking history. She has never used smokeless tobacco. She reports current alcohol use. She reports current drug use. Drug: Marijuana.   Family History:  The patient's family history includes Cirrhosis in her father; Lung cancer in her mother.    ROS:  Please see the history of present illness.   Otherwise, review of systems are positive for none.   All other systems are reviewed and negative.    PHYSICAL EXAM: VS:  BP 138/90 (BP Location: Left Arm, Patient Position: Sitting, Cuff Size: Normal)   Pulse 72   Ht $R'5\' 2"'Qh$  (1.575 m)   Wt 167 lb 6 oz (75.9 kg)   SpO2 99%   BMI 30.61 kg/m  , BMI Body mass index is 30.61 kg/m. GEN: Well nourished, well developed, in no acute distress  HEENT: normal  Neck: no JVD, carotid bruits, or masses Cardiac: RRR; no murmurs, rubs, or gallops,no edema  Respiratory:  clear to auscultation bilaterally, normal work of breathing GI: soft, nontender, nondistended, + BS MS: no deformity or atrophy  Skin: warm and dry, no rash Neuro:  Strength and sensation are intact Psych: euthymic mood, full affect Vascular: Femoral pulse: +2 on the right and +1 on the left.  Dorsalis  pedis is +2 on the right and not palpable on the left.  Posterior tibial is not palpable.  EKG:  EKG is  not ordered today.   Recent Labs: 04/11/2020: Magnesium 2.0 09/14/2020: ALT 20; Hemoglobin 12.6; Platelets 290 09/18/2020: BUN 27; Creatinine, Ser 1.01; Potassium 4.4; Sodium 134    Lipid Panel    Component Value Date/Time   CHOL 304 (H) 09/05/2020 0618   CHOL 170 12/09/2019 1528   TRIG 108 09/05/2020 0618   HDL 55 09/05/2020 0618   HDL 38 (L) 12/09/2019 1528   CHOLHDL 5.5 09/05/2020 0618   VLDL 22 09/05/2020 0618   LDLCALC 227 (H) 09/05/2020 0618   LDLCALC 103 (H) 12/09/2019 1528   LDLDIRECT 183.5 (H) 07/27/2020 1601      Wt Readings from Last 3 Encounters:  01/29/21 167 lb 6 oz (75.9 kg)  12/13/20 172 lb 6 oz (78.2 kg)  09/14/20 169 lb (76.7 kg)       PAD Screen 11/28/2015  Previous PAD dx? No  Previous surgical procedure? No  Pain with walking? Yes  Subsides with rest? No  Feet/toe relief with dangling? No  Painful, non-healing ulcers? No  Extremities discolored? No      ASSESSMENT AND PLAN:  1.  Peripheral arterial disease: The patient has bilateral leg claudication.  Symptoms seem to be moderate to severe.  I discussed with him the natural history and management of claudication.  I advised her to work on her risk factors especially quitting smoking completely and start a walking exercise program.  I provided her with written instructions.  If no improvement after 3 months, angiography and endovascular intervention can be considered.  2.  Coronary artery disease involving native coronary arteries with chronic angina: Her symptoms seem to be stable.  She is on long-term dual antiplatelet therapy.  3.  Essential hypertension: Blood pressure is well controlled.  4.  Hyperlipidemia: She is currently on high-dose atorvastatin and Praluent.  However, most recent lipid profile showed an LDL of 227 likely due to poor adherence to therapy.  5.  Type 2 diabetes:  Poorly controlled  6.  Tobacco use: She cut down to 5 cigarettes a day and trying to quit completely.    Disposition:   FU with me in 3 months  Signed,  Kathlyn Sacramento, MD  01/29/2021 4:18 PM    [facility]

## 2021-01-31 ENCOUNTER — Ambulatory Visit: Payer: Medicare HMO | Admitting: Nurse Practitioner

## 2021-03-12 ENCOUNTER — Other Ambulatory Visit: Payer: Self-pay | Admitting: Physician Assistant

## 2021-03-12 NOTE — Telephone Encounter (Signed)
Please schedule overdue F/U appointment with Dr. Gollan. Thank you! 

## 2021-03-26 ENCOUNTER — Other Ambulatory Visit: Payer: Self-pay | Admitting: Cardiovascular Disease

## 2021-03-29 NOTE — Telephone Encounter (Signed)
Please advise if ok to refill Atorvastatin 80 mg tablet. Last filled by Enedina Finner, MD.

## 2021-04-03 ENCOUNTER — Other Ambulatory Visit: Payer: Self-pay

## 2021-04-19 ENCOUNTER — Other Ambulatory Visit: Payer: Self-pay | Admitting: Nurse Practitioner

## 2021-04-19 DIAGNOSIS — I739 Peripheral vascular disease, unspecified: Secondary | ICD-10-CM

## 2021-05-02 ENCOUNTER — Ambulatory Visit: Payer: Medicare HMO | Admitting: Cardiovascular Disease

## 2021-05-10 NOTE — Telephone Encounter (Signed)
Declined at this time car issues for now . Will call back . Closing encounter .

## 2021-09-02 ENCOUNTER — Other Ambulatory Visit: Payer: Self-pay | Admitting: Family

## 2021-09-02 DIAGNOSIS — I10 Essential (primary) hypertension: Secondary | ICD-10-CM

## 2021-09-02 DIAGNOSIS — I25118 Atherosclerotic heart disease of native coronary artery with other forms of angina pectoris: Secondary | ICD-10-CM

## 2021-09-05 ENCOUNTER — Telehealth: Payer: Self-pay | Admitting: Cardiovascular Disease

## 2021-09-05 NOTE — Telephone Encounter (Signed)
Patient should be offered a virtual appt.  For past due f/u and medication management.

## 2021-09-05 NOTE — Telephone Encounter (Signed)
Pt c/o medication issue:  1. Name of Medication:   Alirocumab (PRALUENT) 150 MG/ML SOAJ    2. How are you currently taking this medication (dosage and times per day)?  Inject 1 pen into the skin every 14 (fourteen) days. 3. Are you having a reaction (difficulty breathing--STAT)? No  4. What is your medication issue? Pharmacy needing Prior Authorization. Please advise

## 2021-09-05 NOTE — Telephone Encounter (Signed)
Spoke with the patient regarding a prior authorization for Praluent injection. The patient has not been taking the Praluent due to her living situation. The patient states, it has been at least 6 months since she has taken the Praluent.  The patient has not been able to make an appointment due to no transportation.  Please advise what the patient should do.

## 2021-09-09 ENCOUNTER — Telehealth: Payer: Self-pay | Admitting: Cardiovascular Disease

## 2021-09-09 NOTE — Telephone Encounter (Signed)
Attempted to schedule.  No ans no vm  

## 2021-09-09 NOTE — Telephone Encounter (Signed)
Pt c/o medication issue:  1. Name of Medication: Alirocumab (PRALUENT) 150 MG/ML SOAJ  2. How are you currently taking this medication (dosage and times per day)? N/A  3. Are you having a reaction (difficulty breathing--STAT)? N/A  4. What is your medication issue? Baxter Hire is calling from [facility] to speak with someone in regards to PA for this medication. She states you can speak with whomever when calling back. Prescription # needed for calling back is [phone].

## 2021-09-10 NOTE — Telephone Encounter (Signed)
Spoke with Levada Dy with [facility] pharamcy at the [place] at [phone] for prior authorization for Praluent 150 mg. Levada Dy will fax the request to get the PA started for Praluent 150 mg.

## 2021-09-11 NOTE — Telephone Encounter (Signed)
Spoke with [name] regarding the Praluent 150 MG/ML SOAJ prior authorization. The patient stated, she does not need a prior authorization at this time, she would like to wait until her follow up appointment. A note sent to the front desk to contact her for a follow up appointment.

## 2021-09-12 ENCOUNTER — Emergency Department: Payer: Medicare Other

## 2021-09-12 ENCOUNTER — Other Ambulatory Visit: Payer: Self-pay

## 2021-09-12 ENCOUNTER — Emergency Department
Admission: EM | Admit: 2021-09-12 | Discharge: 2021-09-12 | Disposition: A | Discharge status: Home / Self Care | Payer: Medicare Other | Attending: Emergency Medicine | Admitting: Emergency Medicine

## 2021-09-12 ENCOUNTER — Encounter: Payer: Self-pay | Admitting: Intensive Care

## 2021-09-12 DIAGNOSIS — M5431 Sciatica, right side: Secondary | ICD-10-CM | POA: Diagnosis not present

## 2021-09-12 DIAGNOSIS — J449 Chronic obstructive pulmonary disease, unspecified: Secondary | ICD-10-CM | POA: Insufficient documentation

## 2021-09-12 DIAGNOSIS — E119 Type 2 diabetes mellitus without complications: Secondary | ICD-10-CM | POA: Diagnosis not present

## 2021-09-12 DIAGNOSIS — I2510 Atherosclerotic heart disease of native coronary artery without angina pectoris: Secondary | ICD-10-CM | POA: Diagnosis not present

## 2021-09-12 DIAGNOSIS — I10 Essential (primary) hypertension: Secondary | ICD-10-CM | POA: Diagnosis not present

## 2021-09-12 DIAGNOSIS — M7122 Synovial cyst of popliteal space [Baker], left knee: Secondary | ICD-10-CM | POA: Diagnosis not present

## 2021-09-12 DIAGNOSIS — M25552 Pain in left hip: Secondary | ICD-10-CM | POA: Diagnosis present

## 2021-09-12 MED ORDER — DICLOFENAC SODIUM 1 % EX GEL: 4.0000 g | Freq: Four times a day (QID) | CUTANEOUS | 2 refills | Status: AC

## 2021-09-12 MED ORDER — LIDOCAINE 5 % EX PTCH: 1.0000 | MEDICATED_PATCH | Freq: Two times a day (BID) | CUTANEOUS | 11 refills | Status: DC

## 2021-09-12 NOTE — ED Triage Notes (Signed)
Patient c/o left hip pain/groin pain that starts in her buttocks and radiates down her leg X 4 weeks ago

## 2021-09-12 NOTE — ED Provider Notes (Signed)
[facility] Provider Note    Event Date/Time   First MD Initiated Contact with Patient 09/12/21 1652     (approximate)   History   Chief Complaint Hip Pain and Groin Pain   HPI Jennifer [name] is a 61 y.o. female, history of hypertension, GERD, bipolar disorder, CAD, morbid obesity, COPD, type 2 diabetes, seizures, presents to the emergency department for evaluation of hip/groin pain.  Patient states that she has been experiencing pain in her left buttocks that extends into the groin, thigh, and down to her lower leg intermittently.  Describes pain as dull with intermittent sharp sensations with certain movements.  She was seen at urgent care approximately 2 weeks ago, where they prescribed her gabapentin.  However, she has had minimal relief from this.  She has additionally been taking ibuprofen and tizanidine with minimal relief.  She says that she has had sciatica before on her right side, but never her left.  She is additionally concerned for blood clots in her legs, as this is happened to her before.  She is currently on anti-platelets (aspirin and ticagrelor).  Denies fever/chills, chest pain, shortness of breath, abdominal pain, flank pain, nausea/vomiting, diarrhea, dysuria, bowel/bladder dysfunction, dizziness/lightheadedness, recent weight loss, or rash/lesions.  History Limitations: Bipolar disorder.  Tangential speech.        Physical Exam  Triage Vital Signs: ED Triage Vitals  Enc Vitals Group     BP 09/12/21 1630 (!) 149/95     Pulse Rate 09/12/21 1630 96     Resp 09/12/21 1630 18     Temp 09/12/21 1630 98.2 F (36.8 C)     Temp Source 09/12/21 1630 Oral     SpO2 09/12/21 1630 94 %     Weight 09/12/21 1630 145 lb (65.8 kg)     Height 09/12/21 1630 5\' 2"  (1.575 m)     Head Circumference --      Peak Flow --      Pain Score 09/12/21 1630 10     Pain Loc --      Pain Edu? --      Excl. in GC? --     Most recent vital signs: Vitals:    09/12/21 1630  BP: (!) 149/95  Pulse: 96  Resp: 18  Temp: 98.2 F (36.8 C)  SpO2: 94%    General: Awake, NAD.  Skin: Warm, dry. No rashes or lesions.  Eyes: PERRL. Conjunctivae normal.  CV: Good peripheral perfusion.  Resp: Normal effort.  Abd: Soft, non-tender. No distention.  Neuro: At baseline. No gross neurological deficits.   Focused Exam: No gross wounds to the left lower extremity.  Pulse, motor, sensation intact distally.  Pain elicited with straight leg test on the left side.  No bony tenderness.  No areas of warmth, erythema, or tenderness.  Physical Exam    ED Results / Procedures / Treatments  Labs (all labs ordered are listed, but only abnormal results are displayed) Labs Reviewed - No data to display   EKG N/A.   RADIOLOGY  ED Provider Interpretation: I personally viewed and interpreted these images.  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  Ultrasound shows no evidence of DVT, but does show 5 cm Baker's cyst.  No results found.  PROCEDURES:  Critical Care performed: N/A.  Procedures    MEDICATIONS ORDERED IN ED: Medications - No data to display   IMPRESSION / MDM / ASSESSMENT AND PLAN / ED COURSE  I  reviewed the triage vital signs and the nursing notes.                              Differential diagnosis includes, but is not limited to, lumbar radiculopathy, sciatica, pelvic/hip fracture, piriformis syndrome, DVT  ED Course Patient appears well, vitals within normal limits for the patient.  NAD.  Assessment/Plan Patient presents with 4 weeks of left-sided hip pain extending into her lower extremity.  Presentation is consistent with sciatica.  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  I suspect that the combination of all these things is contributing to her pain.  She is currently already taking tizanidine and gabapentin.  Encouraged  her to take Tylenol as well.  We will additionally provide her with prescriptions for lidocaine patches and Voltaren gel to be used as needed.  We will provide her with a referral to orthopedics for ongoing management.  We will plan to discharge.  Provided the patient with anticipatory guidance, return precautions, and educational material. Encouraged the patient to return to the emergency department at any time if they begin to experience any new or worsening symptoms. Patient expressed understanding and agreed with the plan.       FINAL CLINICAL IMPRESSION(S) / ED DIAGNOSES   Final diagnoses:  Left hip pain  Baker cyst, left     Rx / DC Orders   ED Discharge Orders          Ordered    lidocaine (LIDODERM) 5 %  Every 12 hours        09/12/21 1916    diclofenac Sodium (VOLTAREN ARTHRITIS PAIN) 1 % GEL  4 times daily        09/12/21 1916             Note:  This document was prepared using Dragon voice recognition software and may include unintentional dictation errors.   Varney Daily, [place] 09/12/21 Judie Petit, MD 09/12/21 [phone]

## 2021-09-12 NOTE — Discharge Instructions (Addendum)
-  Take Tylenol as needed for pain.  You may also continue utilizing your gabapentin as prescribed.  You may additionally utilize the lidocaine patches and Voltaren gel as needed.  -Review the educational packet provided.  Recommend scheduling appointment with the orthopedist listed above or with a physical therapist.  -Return to the emergency department anytime if you begin to experience any new or worsening symptoms.

## 2021-09-12 NOTE — Telephone Encounter (Signed)
[facility] calling requesting call back in regards to Praluent prior authorization. States that they still need to know the status of this prior [place].

## 2021-09-12 NOTE — ED Notes (Signed)
See triage note, pt reports left hip and groin pain. Went to doctor and was given gabapentin. States thought it was sciatic pain but only usually has that on right side.  Brought to treatment room in w/c

## 2021-09-12 NOTE — Telephone Encounter (Signed)
Spoke with pharmacy and informed them PA not needed at this time however pharmacist states no one from their office called RE: PA for Praluent. She states patient has not received that medication from their pharmacy since July of last year. See below.

## 2021-09-20 ENCOUNTER — Other Ambulatory Visit: Payer: Self-pay

## 2021-09-20 ENCOUNTER — Emergency Department
Admission: EM | Admit: 2021-09-20 | Discharge: 2021-09-20 | Disposition: A | Discharge status: Home / Self Care | Payer: Medicare Other | Attending: Emergency Medicine | Admitting: Emergency Medicine

## 2021-09-20 DIAGNOSIS — J449 Chronic obstructive pulmonary disease, unspecified: Secondary | ICD-10-CM | POA: Diagnosis not present

## 2021-09-20 DIAGNOSIS — N182 Chronic kidney disease, stage 2 (mild): Secondary | ICD-10-CM | POA: Diagnosis not present

## 2021-09-20 DIAGNOSIS — M25552 Pain in left hip: Secondary | ICD-10-CM | POA: Diagnosis present

## 2021-09-20 DIAGNOSIS — E119 Type 2 diabetes mellitus without complications: Secondary | ICD-10-CM | POA: Diagnosis not present

## 2021-09-20 DIAGNOSIS — F17200 Nicotine dependence, unspecified, uncomplicated: Secondary | ICD-10-CM | POA: Diagnosis not present

## 2021-09-20 DIAGNOSIS — I129 Hypertensive chronic kidney disease with stage 1 through stage 4 chronic kidney disease, or unspecified chronic kidney disease: Secondary | ICD-10-CM | POA: Insufficient documentation

## 2021-09-20 NOTE — ED Triage Notes (Signed)
Pt presents to ER form home c/o left hip pain that has been going on for a while now.  Pt has been seen here for same.  Pt was seen at [facility] 5/31 and rx steroids, but they have been ineffective.  Pt states she has been almost unable to walk d/t her pain and states "I just cant take this pain anymore."  States other providers have not given her pain meds, because she in being followed by a pain clinic, but pt states she has not been seen by them in a long time.  Pt A&O x4 at this time in NAD.

## 2021-09-20 NOTE — ED Notes (Signed)
See triage note. Pt reports L hip and knee pain for a month; reports recently moved so has been lifting a lot; pt abel to move appropriately; reports has been "popping ibuprofen like candy"; pt in NAD. Has been on gabapentin which states hasn't helped much. Reports has had imaging previously completed and was referred [facility] but gave up when kept being put on hold due to holiday. Child at bedside with pt. Pt able to put weight on L side but not without extreme pain so pt has been limping. Pt also c/o peeling at the bottom of her L foot; skin peeling noted at bottom of L foot.

## 2021-09-20 NOTE — ED Notes (Signed)
Pt on a wheelchair being pushed by her accompanying kid,  states she will go back to [facility]  who referred her to come here.

## 2021-09-20 NOTE — Discharge Instructions (Addendum)
-  Follow-up with pain management, as discussed. -Follow-up with orthopedics as discussed. -Continue taking your daily medications as needed for pain.

## 2021-09-20 NOTE — ED Notes (Signed)
Pt seen by PA Brandon at bedside, advised to follow up w/ pain management. . Pt started yelling/shouting in room , states has been to 4 places to get herself checked and cannot wait to get with her appointment in 2 weeks.  Pt was seen walking out of the room and back again with a child demanding to be seen by real doctor.

## 2021-09-21 NOTE — ED Provider Notes (Signed)
[facility] Provider Note    None    (approximate)   History   Chief Complaint Hip Pain   HPI BIRTIE FULLAM is a 61 y.o. female, history of bipolar disorder, hypertension, tobacco use, GERD, hyperlipidemia, CAD, type 2 diabetes, morbid obesity, COPD, CKD stage II, presents to the emergency department for evaluation of hip pain.  She states that she has been experiencing pain in her left buttocks extending into the thigh and lower leg intermittently, particularly with walking and certain movements.  Denies any recent illnesses or injuries.  Denies fever/chills, chest pain, shortness of breath, abdominal pain, flank pain, nausea/vomiting, diarrhea, dysuria, saddle anesthesia, bowel/bladder dysfunction, cold sensation in lower extremities, dizziness/lightheadedness, or headache.  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  Also incidentally found Baker's cyst as well on ultrasound during a DVT rule out.   Since her last emergency department visit, she has trialed several different medications, including tizanidine, gabapentin, Tylenol, ibuprofen, lidocaine patches, and Voltaren gel with reportedly no improvement.  She recently saw orthopedics on 09/18/2021, where they provided her with a course of p.o. steroids.  She is currently on day 3, though states that it does not work.  She states that she " wants something else to take for pain, but nobody wants to prescribe me anything due to my pain management program".  She has a follow-up appointment with her pain clinic in 2 weeks.   History Limitations: Patient is distressed and tangential in her speech.        Physical Exam  Triage Vital Signs: ED Triage Vitals  Enc Vitals Group     BP 09/20/21 2049 135/84     Pulse Rate 09/20/21 2049 91     Resp  09/20/21 2049 16     Temp 09/20/21 2049 98.2 F (36.8 C)     Temp Source 09/20/21 2049 Oral     SpO2 09/20/21 2049 95 %     Weight 09/20/21 2048 145 lb (65.8 kg)     Height 09/20/21 2048 5\' 2"  (1.575 m)     Head Circumference --      Peak Flow --      Pain Score 09/20/21 2055 10     Pain Loc --      Pain Edu? --      Excl. in [place]? --     Most recent vital signs: Vitals:   09/20/21 2204 09/20/21 2204  BP: (!) 181/93   Pulse: 85   Resp:  18  Temp:    SpO2: 96%     General: Awake, distressed.  Tangential. Skin: Warm, dry. No rashes or lesions.  Eyes: PERRL. Conjunctivae normal.  CV: Good peripheral perfusion.  Resp: Normal effort.  Abd: Soft, non-tender. No distention.  Neuro: At baseline. No gross neurological deficits.   Focused Exam: No gross deformity to the left lower extremity.  No significant bony tenderness.  Pulse, motor, sensation intact distally.  Pain elicited with straight leg test on the left side.  Patient is ambulating across the room unassisted with no significant pain.  Physical Exam    ED Results / Procedures / Treatments  Labs (all labs ordered are listed, but only abnormal results are displayed) Labs Reviewed - No data to display   EKG N/A.   RADIOLOGY  ED Provider Interpretation:  N/A.  No results found.  PROCEDURES:  Critical Care performed: N/A.  Procedures    MEDICATIONS ORDERED IN ED: Medications - No data to display   IMPRESSION / MDM / [place] / ED COURSE  I reviewed the triage vital signs and the nursing notes.                              Differential diagnosis includes, but is not limited to, hip osteoarthritis, Baker's cyst, chronic pain, malingering   Assessment/Plan Patient presents with left-sided hip/lower extremity pain x2 weeks, consistent with her previous diagnosis of osteoarthritis in the hip with sciatica.  Patient additionally has a uncomplicated Baker's cyst.  Very low suspicion for any  serious or life-threatening pathology at this point, as she is able to ambulate well on her own without any assistance.  She reports no improvement with any of the other therapies previously provided, including tizanidine, Tylenol, lidocaine patches, gabapentin, ibuprofen/Voltaren gel, etc.  She is currently on a Medrol Dosepak prescribed by orthopedics, but states that this is not working for her.    Advised the patient that I am happy to refill any of these previous medications, however we do not have any additional therapies available that can help her at this point.  I spoke with her about the dangers and harms of opiate and benzodiazepines, as these being medications would likely hurt her more than it would help.  Advised her that in her orthopedics note, they had told her that they would consider performing steroid injection in the left hip, as well as a possible surgical intervention for the Baker's cyst if her symptoms do not improve.  She states that she is aware of this, however her appointment is in 2 weeks and she states that " I cannot live like this for 2 weeks".  She demanded that we perform these procedures here. Advised her that we cannot perform these procedures here in the emergency department at this time and that unfortunately she will need to wait for her orthopedic and pain clinic appointments.    Patient was initially amenable to discharge at this point after refusing any refills of nonopiate therapies and was provided with discharge paperwork, however she then proceeded to yell, saying " this is bullshit, I want to see a real fucking doctor.  Not leaving until I see a real fucking doctor".  She proceeded to walk back into her room and sit on the bed.  Advised her that I would be happy to bring in another physician to provide a second opinion.  When I returned to her room, she reportedly asked the nurse to wheel her out to her car.    Patient's presentation is most consistent with  acute, uncomplicated illness.        FINAL CLINICAL IMPRESSION(S) / ED DIAGNOSES   Final diagnoses:  Pain of left hip     Rx / DC Orders   ED Discharge Orders     None        Note:  This document was prepared using Dragon voice recognition software and may include unintentional dictation errors.   Teodoro Spray, [place] 09/21/21 [place], MD 09/29/21 [phone]

## 2021-09-26 ENCOUNTER — Other Ambulatory Visit: Payer: Self-pay | Admitting: Family

## 2021-09-26 DIAGNOSIS — I10 Essential (primary) hypertension: Secondary | ICD-10-CM

## 2021-09-27 NOTE — Telephone Encounter (Signed)
Rx(s) sent to pharmacy electronically.  

## 2021-10-30 ENCOUNTER — Other Ambulatory Visit: Payer: Self-pay | Admitting: Cardiovascular Disease

## 2021-10-30 NOTE — Telephone Encounter (Signed)
I spoke with the patient and advised her we received a refill request for plavix 75 mg once daily, but I do not see that she is taking this. Per the patient, she is not taking plavix, but is on brilinta.  The patient was appreciative of the call back to clarify.

## 2021-10-31 NOTE — Progress Notes (Signed)
Date:  11/01/2021   ID:  Jennifer [name], DOB 26-Nov-1960, MRN [id number]  Patient Location:  [address] [place] [address]   Provider location:   [facility], [place] office  PCP:  Pcp, No  Cardiologist:  [name], MD   Chief Complaint  Patient presents with   Other    Pre op clearance pt would like to discuss dental and surgery concerns. Meds reviewed verbally with pt.     History of Present Illness:    Jennifer [name] is a 61 y.o. female   past medical history of Poorly controlled diabetes type 2, hemoglobin A1c >13 Active smoker coronary artery disease, stent to the mid RCA 2008, repeat stenting to the mid RCA 2012 ,   catheterization February 2016 showing severe distal RCA disease estimated at 90%, patent mid RCA stents, also with 70% mid to distal circumflex disease of a small vessel,  Catheterization April 2018 stent to the right Catheterization November 2018 medical management recommended presenting for routine follow-up of her coronary artery disease  Last seen by myself in clinic November 2021 Seen by Dr. Fletcher Anon October 2022  Needs injections for pain clinic Needs to come off brilinta Needs teeth fixed  Taking lasix prn for ankle swelling Still smoking 1/2 ppd  New pmd [name] at [facility]  Requesting refills on her cardiac medications Denies any chest pain or shortness of breath concerning for angina Ambulation limited by chronic leg pain  Reports that she lost her Praluent last year as she did not have a refrigerator, now has a refrigerator to store the medication  Continues to have social issues, thinking about moving into her camper on her brother's property.  Has a 24-year-old child with her on today's visit  Prior records /procedures reviewed Cardiac catheterization May 2022 Stent placed to distal RCA  Severe single-vessel coronary artery disease with thrombotic 100% occlusion at the proximal margin of distal RCA stent with  thrombus embolization into small RPL branches. Mild proximal/mid RCA in-stent restenosis and stable sequential 50-80% RPDA lesions. Nonobstructive CAD involving the left coronary artery, similar to prior catheterization in 03/2020. Low normal left ventricular contraction with inferior hypokinesis (LVEF 50-55%) and moderately elevated filling pressure (LVEDP 25-30 mmHg). Successful PCI to distal RCA with placement of overlapping Resolute Onyx 3.0 x 18 mm (proximal) and 3.0 x 15 mm (distal) drug-eluting stents with 0% residual stenosis and TIMI-3 flow.  The majority of the RCA is now stented with the proximal and distal segments having 2 layers.   EKG personally reviewed by myself on todays visit Shows normal sinus rhythm rate 77 bpm no significant ST or T wave changes  Other past medical history reviewed Cardiac catheterization November 2018 Medical management recommended for moderate proximal RCA disease, left circumflex disease   Echocardiogram done November 2018 Normal LV function   cardiac catheterization April 2018 for unstable angina, minimal troponin elevation,  Procedure April 2018 with ISR of RCA stent proximal to mid Vessel PCI with resolute onyx 3.5 x 30 mm  Echo: 02/2017 Left ventricle: The cavity size was normal. Systolic function was   normal. The estimated ejection fraction was in the range of 55%   to 60%. Wall motion was normal; there were no regional wall   motion abnormalities. Doppler parameters are consistent with   abnormal left ventricular relaxation (grade 1 diastolic   dysfunction). - Right ventricle: Systolic function was normal. - Pulmonary arteries: Systolic pressure was within the normal  range.   Past Medical History:  Diagnosis Date   Anxiety    Arthritis    "qwhere" (06/14/2014)   Bipolar disorder ([place])    CAD (coronary artery disease)    a. 2008 DES->RCA; b. 2012 PCI: 21mCA s/p DES; b. 05/2014 DES-> 90 dRCA; c. 11/16 PCI: dRCA ISR->DES;  d. 4/18  PCI: p-mRCA 95% s/p PCI/DES; e. 02/2017 Cath: patent dRCA stent, RPDA 90ost->Med Rx; f. 05/2017 PTCA mRCA 2/2 ISR; g. 05/2019 Cath: stable dzs; f. 08/2020 PCI: LM nl, LAD nl, D1 40, LCX 60p/m, OM1 50, RCA 20p/m, 1049m (3.0x18 & 3.0x15 Resolute Onyx DESs), PDA 80, RPDA 80, RPAV 100. EF 50-55.   Chronic bronchitis ([place]   "get it q yr"   Daily headache    "when my blood pressure is up" (06/14/2014)   Depression    Diastolic dysfunction    a. 02/2017 Echo: EF 55-60%, no rwma, Gr1 DD, nl RV fxn.   Diastolic dysfunction    a. 08/2020 Echo: EF 55-60%, mod LVH, grI DD. NL RV fxn.   GERD (gastroesophageal reflux disease)    Heart murmur    High cholesterol    History of stomach ulcers    Hypertension    Insomnia    Kidney stones    Migraine    "haven't had them in a good while; did get them 1-2 times/yr" (06/14/2014)   Myocardial infarction ([facility]2008; ~ 2012   Pneumonia    "get it 1-2 times/year" (06/14/2014)   RLS (restless legs syndrome)    Seizures (HCAda   Sleep apnea    "they want me to do the lab but I haven't" (06/14/2014)   [facility] ([facility]   "they say I've had several" (06/14/2014)   Tobacco abuse    Type II diabetes mellitus (HCC)    Urinary, incontinence, stress female    Past Surgical History:  Procedure Laterality Date   ABDOMINAL HYSTERECTOMY  1984   "I've got 1 ovary left"   CARDIAC CATHETERIZATION N/A 02/26/2015   Procedure: Left Heart Cath and Coronary Angiography;  Surgeon: MuWellington HampshireMD;  [facility];  Service: Cardiovascular;  Laterality: N/A;   CARDIAC CATHETERIZATION N/A 02/26/2015   Procedure: Coronary Stent Intervention;  Surgeon: MuWellington HampshireMD;  [place] [facility];  Service: Cardiovascular;  Laterality: N/A;   CESAREAN SECTION  1984   CORONARY ANGIOPLASTY WITH STENT PLACEMENT  2008; ~ 2012   "1; 1"   CORONARY BALLOON ANGIOPLASTY N/A 06/09/2017   Procedure: CORONARY BALLOON ANGIOPLASTY;  Surgeon: EnNelva BushMD;   [place] [facility];  Service: Cardiovascular;  Laterality: N/A;   CORONARY STENT INTERVENTION N/A 08/04/2016   Procedure: Coronary Stent Intervention;  Surgeon: MuWellington HampshireMD;  [place] [facility];  Service: Cardiovascular;  Laterality: N/A;   CORONARY/GRAFT ACUTE MI REVASCULARIZATION N/A 09/05/2020   Procedure: Coronary/Graft Acute MI Revascularization;  Surgeon: EnNelva BushMD;  [place];  Service: Cardiovascular;  Laterality: N/A;   DILATION AND CURETTAGE OF UTERUS     EXTRACORPOREAL SHOCK WAVE LITHOTRIPSY  X 2   I & D EXTREMITY Left 02/09/2018   Procedure: IRRIGATION AND DEBRIDEMENT EXTREMITY;  Surgeon: CiHerbert PunMD;  [facility];  Service: General;  Laterality: Left;   KNEE ARTHROSCOPY Left    LEFT HEART CATH Bilateral 08/04/2016   Procedure: Left Heart Cath poss PCI;  Surgeon: MuWellington HampshireMD;  [facility];  Service: Cardiovascular;  Laterality: Bilateral;   LEFT HEART CATH AND CORONARY ANGIOGRAPHY Left 02/19/2017   Procedure: LEFT HEART CATH AND CORONARY ANGIOGRAPHY;  Surgeon: Minna Merritts, MD;  Location: [place] CV LAB;  Service: Cardiovascular;  Laterality: Left;   LEFT HEART CATH AND CORONARY ANGIOGRAPHY Left 06/09/2017   Procedure: LEFT HEART CATH AND CORONARY ANGIOGRAPHY;  Surgeon: Nelva Bush, MD;  Location: [place] CV LAB;  Service: Cardiovascular;  Laterality: Left;   LEFT HEART CATH AND CORONARY ANGIOGRAPHY N/A 04/10/2020   Procedure: LEFT HEART CATH AND CORONARY ANGIOGRAPHY;  Surgeon: Wellington Hampshire, MD;  Location: [place] CV LAB;  Service: Cardiovascular;  Laterality: N/A;   LEFT HEART CATH AND CORONARY ANGIOGRAPHY N/A 09/05/2020   Procedure: LEFT HEART CATH AND CORONARY ANGIOGRAPHY;  Surgeon: Nelva Bush, MD;  Location: [place] CV LAB;  Service: Cardiovascular;  Laterality: N/A;   LEFT HEART CATHETERIZATION WITH CORONARY ANGIOGRAM N/A  06/15/2014   Procedure: LEFT HEART CATHETERIZATION WITH CORONARY ANGIOGRAM;  Surgeon: Jettie Booze, MD;  Location: [facility] CATH LAB;  Service: Cardiovascular;  Laterality: N/A;   PERCUTANEOUS CORONARY STENT INTERVENTION (PCI-S)  06/15/2014   Procedure: PERCUTANEOUS CORONARY STENT INTERVENTION (PCI-S);  Surgeon: Jettie Booze, MD;  Location: [facility] CATH LAB;  Service: Cardiovascular;;   TONSILLECTOMY     TUBAL LIGATION       Current Meds  Medication Sig   albuterol (PROVENTIL HFA;VENTOLIN HFA) 108 (90 Base) MCG/ACT inhaler Inhale 2 puffs into the lungs every 4 (four) hours as needed for wheezing or shortness of breath.   Alirocumab (PRALUENT) 150 MG/ML SOAJ Inject 1 pen into the skin every 14 (fourteen) days.   aspirin 81 MG EC tablet Take 1 tablet (81 mg total) by mouth daily.   atorvastatin (LIPITOR) 80 MG tablet TAKE 1 TABLET(80 MG) BY MOUTH DAILY   baclofen (LIORESAL) 10 MG tablet Take 10 mg by mouth 3 (three) times daily as needed for muscle spasms.   blood glucose meter kit and supplies KIT Dispense based on patient and insurance preference. Use up to four times daily as directed. (FOR ICD-9 250.00, 250.01).   Blood Glucose Monitoring Suppl W/DEVICE KIT Test 1-2 times daily; E11.65 diagnosis   diclofenac Sodium (VOLTAREN ARTHRITIS PAIN) 1 % GEL Apply 4 g topically 4 (four) times daily.   doxycycline (MONODOX) 100 MG capsule daily.   empagliflozin (JARDIANCE) 10 MG TABS tablet Take 1 tablet by mouth daily.   furosemide (LASIX) 20 MG tablet TAKE 1 TABLET BY MOUTH EVERY DAY AS NEEDED FOR FLUID OR EDEMA   gabapentin (NEURONTIN) 600 MG tablet Take 1.5 mg by mouth 3 (three) times daily.   Insulin Glargine (BASAGLAR KWIKPEN) 100 UNIT/ML Inject 40 Units into the skin daily. (Patient taking differently: Inject 50-60 Units into the skin as directed.)   Insulin Pen Needle (UNIFINE PENTIPS) 31G X 5 MM MISC use with basaglar   lidocaine (LIDODERM) 5 % Place 1 patch onto the skin every 12 (twelve)  hours. Remove & Discard patch within 12 hours or as directed by MD   metoprolol succinate (TOPROL-XL) 25 MG 24 hr tablet Take 50 mg by mouth daily.   nitroGLYCERIN (NITROSTAT) 0.4 MG SL tablet Place 1 tablet (0.4 mg total) under the tongue every 5 (five) minutes as needed for chest pain.   pantoprazole (PROTONIX) 40 MG tablet Take 40 mg by mouth daily.   permethrin (ELIMITE) 5 % cream Apply 1 Application topically once.   QUEtiapine (SEROQUEL) 200 MG tablet  Take 1 tablet (200 mg total) by mouth at bedtime.   ranolazine (RANEXA) 1000 MG SR tablet Take 1 tablet (1,000 mg total) by mouth 2 (two) times daily.   ticagrelor (BRILINTA) 90 MG TABS tablet Take 1 tablet (90 mg total) by mouth 2 (two) times daily.   traMADol (ULTRAM) 50 MG tablet Take 50 mg by mouth 3 (three) times daily as needed.     Allergies:   Buprenorphine hcl, Carbamazepine, Lithium, Lodine [etodolac], Methocarbamol, Codeine, Levofloxacin in d5w, Tape, Wound dressing adhesive, and Relafen [nabumetone]   Social History   Tobacco Use   Smoking status: Every Day    Packs/day: 0.50    Years: 35.00    Total pack years: 17.50    Types: Cigarettes   Smokeless tobacco: Never  Vaping Use   Vaping Use: Never used  Substance Use Topics   Alcohol use: Yes    Alcohol/week: 0.0 standard drinks of alcohol    Comment: occ   Drug use: Yes    Types: Marijuana    Comment: last used 1 week ago.     Family Hx: The patient's family history includes Cirrhosis in her father; Lung cancer in her mother.  ROS:   Please see the history of present illness.    Review of Systems  Constitutional: Negative.   Respiratory:  Positive for cough, shortness of breath and wheezing.   Cardiovascular: Negative.   Gastrointestinal: Negative.   Musculoskeletal: Negative.   Neurological: Negative.   Psychiatric/Behavioral: Negative.    All other systems reviewed and are negative.     Labs/Other Tests and Data Reviewed:    Recent Labs: No  results found for requested labs within last 365 days.   Recent Lipid Panel Lab Results  Component Value Date/Time   CHOL 304 (H) 09/05/2020 06:18 AM   CHOL 170 12/09/2019 03:28 PM   TRIG 108 09/05/2020 06:18 AM   HDL 55 09/05/2020 06:18 AM   HDL 38 (L) 12/09/2019 03:28 PM   CHOLHDL 5.5 09/05/2020 06:18 AM   LDLCALC 227 (H) 09/05/2020 06:18 AM   LDLCALC 103 (H) 12/09/2019 03:28 PM   LDLDIRECT 183.5 (H) 07/27/2020 04:01 PM    Wt Readings from Last 3 Encounters:  11/01/21 137 lb 6 oz (62.3 kg)  09/20/21 145 lb (65.8 kg)  09/12/21 145 lb (65.8 kg)     Exam:    BP (!) 142/90 (BP Location: Left Arm, Patient Position: Sitting, Cuff Size: Normal)   Pulse 77   Ht _0  (1.575 m)   Wt 137 lb 6 oz (62.3 kg)   SpO2 98%   BMI 25.13 kg/m  Constitutional:  oriented to person, place, and time. No distress.  HENT:  Head: Grossly normal Eyes:  no discharge. No scleral icterus.  Neck: No JVD, no carotid bruits  Cardiovascular: Regular rate and rhythm, no murmurs appreciated Pulmonary/Chest: Clear to auscultation bilaterally, no wheezes or rails Abdominal: Soft.  no distension.  no tenderness.  Musculoskeletal: Normal range of motion Neurological:  normal muscle tone. Coordination normal. No atrophy Skin: Skin warm and dry Psychiatric: normal affect, pleasant  ASSESSMENT & PLAN:    Coronary artery disease of native artery of native heart with stable angina pectoris (HCC) Currently with no symptoms of angina. No further workup at this time. Continue current medication regimen. Stressed the importance of staying on her diabetes medications Praluent refilled, recheck lipids in 3 months, stay on Lipitor 80 Decrease Brilinta down to 60 twice daily, okay to hold Brilinta 5  to 7 days before cortisone shots or dental procedures  Chronic obstructive pulmonary disease, unspecified COPD type ([place]) We have encouraged her to continue to work on weaning her cigarettes and smoking cessation. She  will continue to work on this and does not want any assistance with chantix.  Half pack per day  Poorly controlled type 2 diabetes mellitus with complication ([place]) 40 controlled diabetes, she is aware, recommend medication compliance  Essential hypertension Blood pressure is well controlled on today's visit. No changes made to the medications.  Mixed hyperlipidemia Continue statin  Refill on Praluent, recheck lipids 3 months  Chronic diarrhea Managed by primary care   Total encounter time more than 30 minutes  Greater than 50% was spent in counseling and coordination of care with the patient  Signed, [name], MD  11/01/2021 9:14 AM    [place] Office [address], [place], [place] [address]

## 2021-11-01 ENCOUNTER — Ambulatory Visit (INDEPENDENT_AMBULATORY_CARE_PROVIDER_SITE_OTHER): Payer: Medicare Other | Admitting: Cardiovascular Disease

## 2021-11-01 ENCOUNTER — Encounter: Payer: Self-pay | Admitting: Cardiovascular Disease

## 2021-11-01 VITALS — BP 142/90 | HR 77 | Ht 62.0 in | Wt 137.4 lb

## 2021-11-01 DIAGNOSIS — E785 Hyperlipidemia, unspecified: Secondary | ICD-10-CM

## 2021-11-01 DIAGNOSIS — Z720 Tobacco use: Secondary | ICD-10-CM

## 2021-11-01 DIAGNOSIS — I739 Peripheral vascular disease, unspecified: Secondary | ICD-10-CM

## 2021-11-01 DIAGNOSIS — I10 Essential (primary) hypertension: Secondary | ICD-10-CM

## 2021-11-01 DIAGNOSIS — I25118 Atherosclerotic heart disease of native coronary artery with other forms of angina pectoris: Secondary | ICD-10-CM | POA: Diagnosis not present

## 2021-11-01 DIAGNOSIS — E7849 Other hyperlipidemia: Secondary | ICD-10-CM

## 2021-11-01 DIAGNOSIS — E118 Type 2 diabetes mellitus with unspecified complications: Secondary | ICD-10-CM

## 2021-11-01 DIAGNOSIS — E1165 Type 2 diabetes mellitus with hyperglycemia: Secondary | ICD-10-CM

## 2021-11-01 MED ORDER — METOPROLOL SUCCINATE ER 50 MG PO TB24
50.0000 mg | ORAL_TABLET | Freq: Every day | ORAL | 3 refills | Status: DC
Start: 2021-11-01 — End: 2022-06-09

## 2021-11-01 MED ORDER — PRALUENT 150 MG/ML ~~LOC~~ SOAJ: 1.0000 "pen " | SUBCUTANEOUS | 11 refills | Status: DC

## 2021-11-01 MED ORDER — TICAGRELOR 60 MG PO TABS: 60.0000 mg | ORAL_TABLET | Freq: Two times a day (BID) | ORAL | 3 refills | Status: DC

## 2021-11-01 MED ORDER — ATORVASTATIN CALCIUM 80 MG PO TABS: 80.0000 mg | ORAL_TABLET | Freq: Every day | ORAL | 3 refills | Status: DC

## 2021-11-01 MED ORDER — RANOLAZINE ER 1000 MG PO TB12: 1000.0000 mg | ORAL_TABLET | Freq: Two times a day (BID) | ORAL | 6 refills | Status: DC

## 2021-11-01 NOTE — Patient Instructions (Addendum)
Medication Instructions:   Your physician has recommended you make the following change in your medication:   - DECREASE the brilinta dose down to 60 mg twice a day  OK to hold brilinta for pain shots and dental procedures  If you need a refill on your cardiac medications before your next appointment, please call your pharmacy.   Lab work: Your physician recommends that you return for a FASTING lipid profile in 3 months:   - You will need to be fasting. Please do not have anything to eat or drink after midnight the morning you have the lab work. You may only have water or black coffee with no cream or sugar.   - Please go to the [facility]. You will check in at the front desk to the right as you walk into the atrium. Valet Parking is offered if needed. - No appointment needed. You may go any day between 7 am and 6 pm.    Testing/Procedures: No new testing needed  Follow-Up: At [facility], you and your health needs are our priority.  As part of our continuing mission to provide you with exceptional heart care, we have created designated Provider Care Teams.  These Care Teams include your primary Cardiologist (physician) and Advanced Practice Providers (APPs -  Physician Assistants and Nurse Practitioners) who all work together to provide you with the care you need, when you need it.  You will need a follow up appointment in 6 months  Providers on your designated Care Team:   Nicolasa Ducking, NP Eula Listen, PA-C Cadence Fransico Michael, [place]  COVID-19 Vaccine Information can be found at: [email or web address] For questions related to vaccine distribution or appointments, please email [email or web address] or call [phone].

## 2021-11-02 ENCOUNTER — Other Ambulatory Visit: Payer: Self-pay | Admitting: Cardiovascular Disease

## 2021-11-04 ENCOUNTER — Telehealth: Payer: Self-pay | Admitting: Cardiovascular Disease

## 2021-11-04 NOTE — Telephone Encounter (Signed)
Pt c/o medication issue:  1. Name of Medication: Alirocumab (PRALUENT) 150 MG/ML SOAJ  2. How are you currently taking this medication (dosage and times per day)? Inject 1 pen into the skin every 14 (fourteen) days.  3. Are you having a reaction (difficulty breathing--STAT)? no  4. What is your medication issue? Prior authorization is needed.

## 2021-11-05 NOTE — Telephone Encounter (Signed)
Please see note below. 

## 2021-11-06 NOTE — Telephone Encounter (Signed)
PA approved through 05/09/22.patient made aware of the approval

## 2022-01-01 ENCOUNTER — Other Ambulatory Visit: Payer: Self-pay | Admitting: Nurse Practitioner

## 2022-04-02 ENCOUNTER — Other Ambulatory Visit: Payer: Self-pay | Admitting: Cardiovascular Disease

## 2022-04-02 DIAGNOSIS — I10 Essential (primary) hypertension: Secondary | ICD-10-CM

## 2022-05-06 ENCOUNTER — Other Ambulatory Visit: Payer: Self-pay | Admitting: Cardiovascular Disease

## 2022-05-07 NOTE — Telephone Encounter (Signed)
Good Morning,   Could you please schedule this patient a 6 month follow up visit? The patient was last seen by Dr. Rockey Situ on 11-01-21. Thank you so much.

## 2022-05-09 NOTE — Telephone Encounter (Signed)
Pt was called and unable to leave voicemail due to mailbox being full.

## 2022-05-20 NOTE — Telephone Encounter (Signed)
Pt called, unable to leave voicemail due to mailbox being full.

## 2022-05-27 ENCOUNTER — Encounter: Payer: Self-pay | Admitting: Cardiovascular Disease

## 2022-05-27 NOTE — Telephone Encounter (Signed)
Unable to contact patient.  Letter sent.

## 2022-06-08 NOTE — Progress Notes (Unsigned)
Date:  06/09/2022   ID:  Jennifer [name], DOB [birth year], MRN [id number]  Patient Location:  [address] [place] [address]   Provider location:   [facility], [place] office  PCP:  Pcp, No  Cardiologist:  [name], MD   Chief Complaint  Patient presents with   6 month follow up     Patient is getting over pneumonia and has some chest pain & shortness of breath. Medications reviewed by the patient verbally.      History of Present Illness:    Jennifer [name] is a 62 y.o. female   past medical history of Poorly controlled diabetes type 2, hemoglobin A1c >13 Active smoker coronary artery disease, stent to the mid RCA 2008, repeat stenting to the mid RCA 2012 ,   catheterization February 2016 showing severe distal RCA disease estimated at 90%, patent mid RCA stents, also with 70% mid to distal circumflex disease of a small vessel,  Catheterization April 2018 stent to the right Catheterization November 2018 medical management recommended presenting for routine follow-up of her coronary artery disease  Last seen by myself in clinic July 2023 Seen by Dr. Fletcher Anon October 2022  Needs nerve block for pain clinic/sciatica Needs to come off brilinta  Teeth removed, had significant bleeding as she did not stop her Brilinta Some chest pain, feels it is from anxiety Lots of stress at home, taking care of her grandchild who is 81 years old, living in a trailer with others  Taking lasix prn for ankle swelling Denies significant leg swelling  Still smoking 1/2 ppd CT scan: "pneumonia", finishing ABX Needs nerve block, needs to come off Brilinta  Not taking praluent, needs a new prescription On gabapentin, nortriptyline No desire to eat, losing weight Did not take BP meds today   chronic leg pain Blood pressure elevated today, reports she did not take her medications today  EKG personally reviewed by myself on todays visit Normal sinus rhythm rate 98 bpm no  significant ST-T wave changes  Prior records /procedures reviewed Cardiac catheterization May 2022 Stent placed to distal RCA  Severe single-vessel coronary artery disease with thrombotic 100% occlusion at the proximal margin of distal RCA stent with thrombus embolization into small RPL branches. Mild proximal/mid RCA in-stent restenosis and stable sequential 50-80% RPDA lesions. Nonobstructive CAD involving the left coronary artery, similar to prior catheterization in 03/2020. Low normal left ventricular contraction with inferior hypokinesis (LVEF 50-55%) and moderately elevated filling pressure (LVEDP 25-30 mmHg). Successful PCI to distal RCA with placement of overlapping Resolute Onyx 3.0 x 18 mm (proximal) and 3.0 x 15 mm (distal) drug-eluting stents with 0% residual stenosis and TIMI-3 flow.  The majority of the RCA is now stented with the proximal and distal segments having 2 layers.   EKG personally reviewed by myself on todays visit Shows normal sinus rhythm rate 77 bpm no significant ST or T wave changes  Other past medical history reviewed Cardiac catheterization November 2018 Medical management recommended for moderate proximal RCA disease, left circumflex disease   Echocardiogram done November 2018 Normal LV function   cardiac catheterization April 2018 for unstable angina, minimal troponin elevation,  Procedure April 2018 with ISR of RCA stent proximal to mid Vessel PCI with resolute onyx 3.5 x 30 mm  Echo: 02/2017 Left ventricle: The cavity size was normal. Systolic function was   normal. The estimated ejection fraction was in the range of 55%   to 60%. Wall  motion was normal; there were no regional wall   motion abnormalities. Doppler parameters are consistent with   abnormal left ventricular relaxation (grade 1 diastolic   dysfunction). - Right ventricle: Systolic function was normal. - Pulmonary arteries: Systolic pressure was within the normal  range.   Past  Medical History:  Diagnosis Date   Anxiety    Arthritis    "qwhere" (06/14/2014)   Bipolar disorder ([place])    CAD (coronary artery disease)    a. 2008 DES->RCA; b. 2012 PCI: 2mCA s/p DES; b. 05/2014 DES-> 90 dRCA; c. 11/16 PCI: dRCA ISR->DES;  d. 4/18 PCI: p-mRCA 95% s/p PCI/DES; e. 02/2017 Cath: patent dRCA stent, RPDA 90ost->Med Rx; f. 05/2017 PTCA mRCA 2/2 ISR; g. 05/2019 Cath: stable dzs; f. 08/2020 PCI: LM nl, LAD nl, D1 40, LCX 60p/m, OM1 50, RCA 20p/m, 1026m (3.0x18 & 3.0x15 Resolute Onyx DESs), PDA 80, RPDA 80, RPAV 100. EF 50-55.   Chronic bronchitis ([place]   "get it q yr"   Daily headache    "when my blood pressure is up" (06/14/2014)   Depression    Diastolic dysfunction    a. 02/2017 Echo: EF 55-60%, no rwma, Gr1 DD, nl RV fxn.   Diastolic dysfunction    a. 08/2020 Echo: EF 55-60%, mod LVH, grI DD. NL RV fxn.   GERD (gastroesophageal reflux disease)    Heart murmur    High cholesterol    History of stomach ulcers    Hypertension    Insomnia    Kidney stones    Migraine    "haven't had them in a good while; did get them 1-2 times/yr" (06/14/2014)   [place]; ~ 2012   Pneumonia    "get it 1-2 times/year" (06/14/2014)   RLS (restless legs syndrome)    Seizures ([facility]   Sleep apnea    "they want me to do the lab but I haven't" (06/14/2014)   [facility]   "they say I've had several" (06/14/2014)   Tobacco abuse    Type II diabetes mellitus (HCC)    Urinary, incontinence, stress female    Past Surgical History:  Procedure Laterality Date   ABDOMINAL HYSTERECTOMY  1984   "I've got 1 ovary left"   CARDIAC CATHETERIZATION N/A 02/26/2015   Procedure: Left Heart Cath and Coronary Angiography;  Surgeon: MuWellington HampshireMD;  [facility];  Service: Cardiovascular;  Laterality: N/A;   CARDIAC CATHETERIZATION N/A 02/26/2015   Procedure: Coronary Stent Intervention;  Surgeon: MuWellington HampshireMD;  [facility];  Service:  Cardiovascular;  Laterality: N/A;   CESAREAN SECTION  1984   CORONARY ANGIOPLASTY WITH STENT PLACEMENT  2008; ~ 2012   "1; 1"   CORONARY BALLOON ANGIOPLASTY N/A 06/09/2017   Procedure: CORONARY BALLOON ANGIOPLASTY;  Surgeon: EnNelva BushMD;  [facility];  Service: Cardiovascular;  Laterality: N/A;   CORONARY STENT INTERVENTION N/A 08/04/2016   Procedure: Coronary Stent Intervention;  Surgeon: MuWellington HampshireMD;  [facility];  Service: Cardiovascular;  Laterality: N/A;   CORONARY/GRAFT ACUTE MI REVASCULARIZATION N/A 09/05/2020   Procedure: Coronary/Graft Acute MI Revascularization;  Surgeon: EnNelva BushMD;  [facility];  Service: Cardiovascular;  Laterality: N/A;   DILATION AND CURETTAGE OF UTERUS     EXTRACORPOREAL SHOCK WAVE LITHOTRIPSY  X 2   I & D EXTREMITY Left 02/09/2018   Procedure: IRRIGATION AND DEBRIDEMENT EXTREMITY;  Surgeon: Herbert Pun, MD;  [facility];  Service: General;  Laterality: Left;   KNEE ARTHROSCOPY Left    LEFT HEART CATH Bilateral 08/04/2016   Procedure: Left Heart Cath poss PCI;  Surgeon: Wellington Hampshire, MD;  Location: [place] CV LAB;  Service: Cardiovascular;  Laterality: Bilateral;   LEFT HEART CATH AND CORONARY ANGIOGRAPHY Left 02/19/2017   Procedure: LEFT HEART CATH AND CORONARY ANGIOGRAPHY;  Surgeon: Minna Merritts, MD;  Location: [place] CV LAB;  Service: Cardiovascular;  Laterality: Left;   LEFT HEART CATH AND CORONARY ANGIOGRAPHY Left 06/09/2017   Procedure: LEFT HEART CATH AND CORONARY ANGIOGRAPHY;  Surgeon: Nelva Bush, MD;  Location: [place] CV LAB;  Service: Cardiovascular;  Laterality: Left;   LEFT HEART CATH AND CORONARY ANGIOGRAPHY N/A 04/10/2020   Procedure: LEFT HEART CATH AND CORONARY ANGIOGRAPHY;  Surgeon: Wellington Hampshire, MD;  Location: [place] CV LAB;  Service: Cardiovascular;  Laterality: N/A;   LEFT HEART CATH AND CORONARY  ANGIOGRAPHY N/A 09/05/2020   Procedure: LEFT HEART CATH AND CORONARY ANGIOGRAPHY;  Surgeon: Nelva Bush, MD;  Location: [place] CV LAB;  Service: Cardiovascular;  Laterality: N/A;   LEFT HEART CATHETERIZATION WITH CORONARY ANGIOGRAM N/A 06/15/2014   Procedure: LEFT HEART CATHETERIZATION WITH CORONARY ANGIOGRAM;  Surgeon: Jettie Booze, MD;  Location: [facility] CATH LAB;  Service: Cardiovascular;  Laterality: N/A;   PERCUTANEOUS CORONARY STENT INTERVENTION (PCI-S)  06/15/2014   Procedure: PERCUTANEOUS CORONARY STENT INTERVENTION (PCI-S);  Surgeon: Jettie Booze, MD;  Location: [facility] CATH LAB;  Service: Cardiovascular;;   TONSILLECTOMY     TUBAL LIGATION       Current Meds  Medication Sig   albuterol (PROVENTIL HFA;VENTOLIN HFA) 108 (90 Base) MCG/ACT inhaler Inhale 2 puffs into the lungs every 4 (four) hours as needed for wheezing or shortness of breath.   aspirin 81 MG EC tablet Take 1 tablet (81 mg total) by mouth daily.   atorvastatin (LIPITOR) 80 MG tablet Take 1 tablet (80 mg total) by mouth daily.   diclofenac Sodium (VOLTAREN ARTHRITIS PAIN) 1 % GEL Apply 4 g topically 4 (four) times daily.   empagliflozin (JARDIANCE) 25 MG TABS tablet Take 25 mg by mouth daily.   furosemide (LASIX) 20 MG tablet TAKE 1 TABLET BY MOUTH EVERY DAY AS NEEDED FOR FLUID RETENTION OR SWELLING   gabapentin (NEURONTIN) 600 MG tablet Take 1.5 mg by mouth 3 (three) times daily.   lisinopril (ZESTRIL) 10 MG tablet Take 10 mg by mouth daily.   metoprolol succinate (TOPROL-XL) 50 MG 24 hr tablet Take 1 tablet (50 mg total) by mouth daily.   nitroGLYCERIN (NITROSTAT) 0.4 MG SL tablet Place 1 tablet (0.4 mg total) under the tongue every 5 (five) minutes as needed for chest pain.   nortriptyline (PAMELOR) 25 MG capsule Take 25 mg by mouth at bedtime.   pantoprazole (PROTONIX) 40 MG tablet Take 40 mg by mouth daily.   permethrin (ELIMITE) 5 % cream Apply 1 Application topically once.   QUEtiapine (SEROQUEL) 200  MG tablet Take 1 tablet (200 mg total) by mouth at bedtime.   ranolazine (RANEXA) 1000 MG SR tablet Take 1 tablet (1,000 mg total) by mouth 2 (two) times daily.   ticagrelor (BRILINTA) 60 MG TABS tablet Take 1 tablet (60 mg total) by mouth 2 (two) times daily. Please call [phone] to schedule an appointment for further refills. Thank you.     Allergies:   Buprenorphine hcl, Carbamazepine, Lithium, Lodine [etodolac], Methocarbamol, Acetaminophen, Codeine,  Levofloxacin in d5w, Other, Oxycodone-acetaminophen, Tape, Wound dressing adhesive, and Relafen [nabumetone]   Social History   Tobacco Use   Smoking status: Every Day    Packs/day: 1.00    Years: 35.00    Total pack years: 35.00    Types: Cigarettes   Smokeless tobacco: Never  Vaping Use   Vaping Use: Never used  Substance Use Topics   Alcohol use: Yes    Alcohol/week: 0.0 standard drinks of alcohol    Comment: occ   Drug use: Yes    Types: Marijuana    Comment: used about 3 days ago     Family Hx: The patient's family history includes Cirrhosis in her father; Lung cancer in her mother.  ROS:   Please see the history of present illness.    Review of Systems  Constitutional: Negative.   Respiratory:  Positive for cough, shortness of breath and wheezing.   Cardiovascular: Negative.   Gastrointestinal: Negative.   Musculoskeletal: Negative.   Neurological: Negative.   Psychiatric/Behavioral: Negative.    All other systems reviewed and are negative.    Labs/Other Tests and Data Reviewed:    Recent Labs: No results found for requested labs within last 365 days.   Recent Lipid Panel Lab Results  Component Value Date/Time   CHOL 304 (H) 09/05/2020 06:18 AM   CHOL 170 12/09/2019 03:28 PM   TRIG 108 09/05/2020 06:18 AM   HDL 55 09/05/2020 06:18 AM   HDL 38 (L) 12/09/2019 03:28 PM   CHOLHDL 5.5 09/05/2020 06:18 AM   LDLCALC 227 (H) 09/05/2020 06:18 AM   LDLCALC 103 (H) 12/09/2019 03:28 PM   LDLDIRECT 183.5 (H)  07/27/2020 04:01 PM    Wt Readings from Last 3 Encounters:  06/09/22 126 lb 8 oz (57.4 kg)  11/01/21 137 lb 6 oz (62.3 kg)  09/20/21 145 lb (65.8 kg)     Exam:    BP (!) 150/100 (BP Location: Left Arm, Patient Position: Sitting, Cuff Size: Normal)   Pulse 98   Ht 5' 2"$  (1.575 m)   Wt 126 lb 8 oz (57.4 kg)   SpO2 96%   BMI 23.14 kg/m  Constitutional:  oriented to person, place, and time. No distress.  HENT:  Head: Grossly normal Eyes:  no discharge. No scleral icterus.  Neck: No JVD, no carotid bruits  Cardiovascular: Regular rate and rhythm, no murmurs appreciated Pulmonary/Chest: Clear to auscultation bilaterally, no wheezes or rails Abdominal: Soft.  no distension.  no tenderness.  Musculoskeletal: Normal range of motion Neurological:  normal muscle tone. Coordination normal. No atrophy Skin: Skin warm and dry Psychiatric: normal affect, pleasant  ASSESSMENT & PLAN:    Preop clearance for nerve block Acceptable risk for procedure and to stop her Brilinta 5 up to 7 days if needed No further cardiac testing needed  Coronary artery disease of native artery of native heart with stable angina pectoris (HCC) Currently with no symptoms of angina. No further workup at this time. Continue current medication regimen. Smoking cessation recommended, new prescription sent in for Praluent, compliance recommended for her Lipitor  Chronic obstructive pulmonary disease, unspecified COPD type ([place]) We have encouraged her to continue to work on weaning her cigarettes and smoking cessation. She will continue to work on this and does not want any assistance with chantix.  Half pack per day  Poorly controlled type 2 diabetes mellitus with complication (HCC) Prior history poorly controlled diabetes, recommend medication compliance  Essential hypertension Blood pressure elevated, reports she did  not take her medications today, recently started on lisinopril with her metoprolol  Mixed  hyperlipidemia Continue statin Lipitor 80 daily Refill on Praluent, recheck lipids 3 months  Chronic diarrhea Managed by primary care   Total encounter time more than 30 minutes  Greater than 50% was spent in counseling and coordination of care with the patient  Signed, [name], MD  06/09/2022 4:11 PM    [place] Office [address] [place] [address], [place], [place] [address]

## 2022-06-09 ENCOUNTER — Encounter: Payer: Self-pay | Admitting: Cardiovascular Disease

## 2022-06-09 ENCOUNTER — Ambulatory Visit: Payer: 59 | Attending: Cardiovascular Disease | Admitting: Cardiovascular Disease

## 2022-06-09 VITALS — BP 150/100 | HR 98 | Ht 62.0 in | Wt 126.5 lb

## 2022-06-09 DIAGNOSIS — I25118 Atherosclerotic heart disease of native coronary artery with other forms of angina pectoris: Secondary | ICD-10-CM

## 2022-06-09 DIAGNOSIS — E785 Hyperlipidemia, unspecified: Secondary | ICD-10-CM | POA: Diagnosis not present

## 2022-06-09 DIAGNOSIS — E1165 Type 2 diabetes mellitus with hyperglycemia: Secondary | ICD-10-CM

## 2022-06-09 DIAGNOSIS — E7849 Other hyperlipidemia: Secondary | ICD-10-CM

## 2022-06-09 DIAGNOSIS — E118 Type 2 diabetes mellitus with unspecified complications: Secondary | ICD-10-CM

## 2022-06-09 DIAGNOSIS — I739 Peripheral vascular disease, unspecified: Secondary | ICD-10-CM

## 2022-06-09 DIAGNOSIS — I10 Essential (primary) hypertension: Secondary | ICD-10-CM | POA: Diagnosis not present

## 2022-06-09 DIAGNOSIS — Z720 Tobacco use: Secondary | ICD-10-CM

## 2022-06-09 DIAGNOSIS — J432 Centrilobular emphysema: Secondary | ICD-10-CM

## 2022-06-09 MED ORDER — PRALUENT 150 MG/ML ~~LOC~~ SOAJ: 150.0000 mg | SUBCUTANEOUS | 3 refills | Status: DC

## 2022-06-09 MED ORDER — ATORVASTATIN CALCIUM 80 MG PO TABS: 80.0000 mg | ORAL_TABLET | Freq: Every day | ORAL | 3 refills | Status: DC

## 2022-06-09 MED ORDER — EMPAGLIFLOZIN 25 MG PO TABS: 25.0000 mg | ORAL_TABLET | Freq: Every day | ORAL | 3 refills | Status: DC

## 2022-06-09 MED ORDER — METOPROLOL SUCCINATE ER 50 MG PO TB24: 50.0000 mg | ORAL_TABLET | Freq: Every day | ORAL | 3 refills | Status: DC

## 2022-06-09 MED ORDER — RANOLAZINE ER 1000 MG PO TB12: 1000.0000 mg | ORAL_TABLET | Freq: Two times a day (BID) | ORAL | 6 refills | Status: AC

## 2022-06-09 MED ORDER — TICAGRELOR 60 MG PO TABS: 60.0000 mg | ORAL_TABLET | Freq: Two times a day (BID) | ORAL | 11 refills | Status: DC

## 2022-06-09 NOTE — Patient Instructions (Addendum)
Medication Instructions:  Stay on atorvastatin Praluent 150 injection every 2 weeks  If you need a refill on your cardiac medications before your next appointment, please call your pharmacy.   Lab work: No new labs needed  Testing/Procedures: No new testing needed  Follow-Up: At [facility], you and your health needs are our priority.  As part of our continuing mission to provide you with exceptional heart care, we have created designated Provider Care Teams.  These Care Teams include your primary Cardiologist (physician) and Advanced Practice Providers (APPs -  Physician Assistants and Nurse Practitioners) who all work together to provide you with the care you need, when you need it.  You will need a follow up appointment in 6 months  Providers on your designated Care Team:   [name] Hodgkins, NP Christell Faith, PA-C Cadence Kathlen Mody, [place]  COVID-19 Vaccine Information can be found at: [email or web address].uk For questions related to vaccine distribution or appointments, please email [email or web address] or call [phone].

## 2022-07-11 ENCOUNTER — Emergency Department: Payer: 59

## 2022-07-11 ENCOUNTER — Inpatient Hospital Stay
Admission: EM | Admit: 2022-07-11 | Discharge: 2022-07-14 | DRG: 281 | Disposition: A | Discharge status: Home / Self Care | Payer: 59 | Attending: Internal Medicine | Admitting: Internal Medicine

## 2022-07-11 ENCOUNTER — Other Ambulatory Visit: Payer: Self-pay

## 2022-07-11 ENCOUNTER — Encounter: Payer: Self-pay | Admitting: Emergency Medicine

## 2022-07-11 DIAGNOSIS — F17210 Nicotine dependence, cigarettes, uncomplicated: Secondary | ICD-10-CM | POA: Diagnosis present

## 2022-07-11 DIAGNOSIS — F1210 Cannabis abuse, uncomplicated: Secondary | ICD-10-CM | POA: Diagnosis present

## 2022-07-11 DIAGNOSIS — I214 Non-ST elevation (NSTEMI) myocardial infarction: Principal | ICD-10-CM | POA: Diagnosis present

## 2022-07-11 DIAGNOSIS — I442 Atrioventricular block, complete: Secondary | ICD-10-CM | POA: Diagnosis present

## 2022-07-11 DIAGNOSIS — I10 Essential (primary) hypertension: Secondary | ICD-10-CM | POA: Diagnosis not present

## 2022-07-11 DIAGNOSIS — F419 Anxiety disorder, unspecified: Secondary | ICD-10-CM | POA: Diagnosis present

## 2022-07-11 DIAGNOSIS — I255 Ischemic cardiomyopathy: Secondary | ICD-10-CM | POA: Diagnosis present

## 2022-07-11 DIAGNOSIS — Z881 Allergy status to other antibiotic agents status: Secondary | ICD-10-CM

## 2022-07-11 DIAGNOSIS — Z955 Presence of coronary angioplasty implant and graft: Secondary | ICD-10-CM

## 2022-07-11 DIAGNOSIS — Z7985 Long-term (current) use of injectable non-insulin antidiabetic drugs: Secondary | ICD-10-CM

## 2022-07-11 DIAGNOSIS — F32A Depression, unspecified: Secondary | ICD-10-CM | POA: Diagnosis not present

## 2022-07-11 DIAGNOSIS — Y831 Surgical operation with implant of artificial internal device as the cause of abnormal reaction of the patient, or of later complication, without mention of misadventure at the time of the procedure: Secondary | ICD-10-CM | POA: Diagnosis present

## 2022-07-11 DIAGNOSIS — T82855A Stenosis of coronary artery stent, initial encounter: Principal | ICD-10-CM | POA: Diagnosis present

## 2022-07-11 DIAGNOSIS — Z7902 Long term (current) use of antithrombotics/antiplatelets: Secondary | ICD-10-CM

## 2022-07-11 DIAGNOSIS — G4733 Obstructive sleep apnea (adult) (pediatric): Secondary | ICD-10-CM | POA: Diagnosis present

## 2022-07-11 DIAGNOSIS — Z801 Family history of malignant neoplasm of trachea, bronchus and lung: Secondary | ICD-10-CM

## 2022-07-11 DIAGNOSIS — K219 Gastro-esophageal reflux disease without esophagitis: Secondary | ICD-10-CM | POA: Diagnosis present

## 2022-07-11 DIAGNOSIS — E119 Type 2 diabetes mellitus without complications: Secondary | ICD-10-CM

## 2022-07-11 DIAGNOSIS — E1165 Type 2 diabetes mellitus with hyperglycemia: Secondary | ICD-10-CM | POA: Diagnosis present

## 2022-07-11 DIAGNOSIS — Z888 Allergy status to other drugs, medicaments and biological substances status: Secondary | ICD-10-CM

## 2022-07-11 DIAGNOSIS — E1151 Type 2 diabetes mellitus with diabetic peripheral angiopathy without gangrene: Secondary | ICD-10-CM | POA: Diagnosis present

## 2022-07-11 DIAGNOSIS — I252 Old myocardial infarction: Secondary | ICD-10-CM

## 2022-07-11 DIAGNOSIS — Z79899 Other long term (current) drug therapy: Secondary | ICD-10-CM | POA: Diagnosis not present

## 2022-07-11 DIAGNOSIS — E785 Hyperlipidemia, unspecified: Secondary | ICD-10-CM

## 2022-07-11 DIAGNOSIS — G40909 Epilepsy, unspecified, not intractable, without status epilepticus: Secondary | ICD-10-CM | POA: Diagnosis present

## 2022-07-11 DIAGNOSIS — E7800 Pure hypercholesterolemia, unspecified: Secondary | ICD-10-CM | POA: Diagnosis present

## 2022-07-11 DIAGNOSIS — F17200 Nicotine dependence, unspecified, uncomplicated: Secondary | ICD-10-CM | POA: Diagnosis not present

## 2022-07-11 DIAGNOSIS — F319 Bipolar disorder, unspecified: Secondary | ICD-10-CM | POA: Diagnosis present

## 2022-07-11 DIAGNOSIS — Z794 Long term (current) use of insulin: Secondary | ICD-10-CM | POA: Diagnosis not present

## 2022-07-11 DIAGNOSIS — Z8673 Personal history of transient ischemic attack (TIA), and cerebral infarction without residual deficits: Secondary | ICD-10-CM

## 2022-07-11 DIAGNOSIS — Z885 Allergy status to narcotic agent status: Secondary | ICD-10-CM | POA: Diagnosis not present

## 2022-07-11 DIAGNOSIS — I2510 Atherosclerotic heart disease of native coronary artery without angina pectoris: Secondary | ICD-10-CM | POA: Diagnosis present

## 2022-07-11 DIAGNOSIS — Z91048 Other nonmedicinal substance allergy status: Secondary | ICD-10-CM

## 2022-07-11 DIAGNOSIS — M79605 Pain in left leg: Secondary | ICD-10-CM | POA: Diagnosis present

## 2022-07-11 DIAGNOSIS — Z7984 Long term (current) use of oral hypoglycemic drugs: Secondary | ICD-10-CM

## 2022-07-11 DIAGNOSIS — G8929 Other chronic pain: Secondary | ICD-10-CM | POA: Diagnosis present

## 2022-07-11 DIAGNOSIS — Z7982 Long term (current) use of aspirin: Secondary | ICD-10-CM

## 2022-07-11 DIAGNOSIS — F4489 Other dissociative and conversion disorders: Secondary | ICD-10-CM | POA: Diagnosis present

## 2022-07-11 DIAGNOSIS — R079 Chest pain, unspecified: Secondary | ICD-10-CM | POA: Diagnosis not present

## 2022-07-11 DIAGNOSIS — Z886 Allergy status to analgesic agent status: Secondary | ICD-10-CM

## 2022-07-11 HISTORY — DX: Peripheral vascular disease, unspecified: I73.9

## 2022-07-11 LAB — BASIC METABOLIC PANEL
Anion gap: 11 (ref 5–15)
BUN: 19 mg/dL (ref 6–20)
CO2: 26 mmol/L (ref 22–32)
Calcium: 9.6 mg/dL (ref 8.9–10.3)
Chloride: 103 mmol/L (ref 98–111)
Creatinine, Ser: 0.57 mg/dL (ref 0.44–1.00)
GFR, Estimated: >60 mL/min (ref >60)
Glucose, Bld: 254 mg/dL — ABNORMAL HIGH (ref 70–99)
Potassium: 3.8 mmol/L (ref 3.5–5.1)
Sodium: 140 mmol/L (ref 135–145)

## 2022-07-11 LAB — PROTIME-INR
INR: 1 (ref 0.8–1.2)
Prothrombin Time: 12.7 seconds (ref 11.4–15.2)

## 2022-07-11 LAB — CBC
HCT: 43.6 % (ref 36.0–46.0)
Hemoglobin: 14.4 g/dL (ref 12.0–15.0)
MCH: 31.2 pg (ref 26.0–34.0)
MCHC: 33 g/dL (ref 30.0–36.0)
MCV: 94.4 fL (ref 80.0–100.0)
Platelets: 333 10*3/uL (ref 150–400)
RBC: 4.62 MIL/uL (ref 3.87–5.11)
RDW: 12.8 % (ref 11.5–15.5)
WBC: 17 10*3/uL — ABNORMAL HIGH (ref 4.0–10.5)
nRBC: 0 % (ref 0.0–0.2)

## 2022-07-11 LAB — TROPONIN I (HIGH SENSITIVITY)
Troponin I (High Sensitivity): 9887 ng/L (ref <18)
Troponin I (High Sensitivity): 12616 ng/L (ref <18)

## 2022-07-11 LAB — APTT: aPTT: 27 seconds (ref 24–36)

## 2022-07-11 MED ORDER — ASPIRIN 81 MG PO TBEC: 81.0000 mg | DELAYED_RELEASE_TABLET | Freq: Every day | ORAL | Status: DC

## 2022-07-11 MED ORDER — QUETIAPINE FUMARATE 25 MG PO TABS
200.0000 mg | ORAL_TABLET | Freq: Every day | ORAL | Status: DC
  Administered 2022-07-12 – 2022-07-13 (×3): 200 mg via ORAL
  Filled 2022-07-11: qty 8
  Filled 2022-07-11: qty 1
  Filled 2022-07-11 (×2): qty 8

## 2022-07-11 MED ORDER — ACETAMINOPHEN 325 MG PO TABS: 650.0000 mg | ORAL_TABLET | ORAL | Status: DC | PRN

## 2022-07-11 MED ORDER — ALBUTEROL SULFATE (2.5 MG/3ML) 0.083% IN NEBU: 2.5000 mg | INHALATION_SOLUTION | RESPIRATORY_TRACT | Status: DC | PRN

## 2022-07-11 MED ORDER — NITROGLYCERIN 0.4 MG SL SUBL: 0.4000 mg | SUBLINGUAL_TABLET | SUBLINGUAL | Status: DC | PRN

## 2022-07-11 MED ORDER — ASPIRIN 81 MG PO CHEW
324.0000 mg | CHEWABLE_TABLET | Freq: Once | ORAL | Status: AC
  Administered 2022-07-11: 324 mg via ORAL
  Filled 2022-07-11: qty 4

## 2022-07-11 MED ORDER — TICAGRELOR 60 MG PO TABS
60.0000 mg | ORAL_TABLET | Freq: Two times a day (BID) | ORAL | Status: DC
  Administered 2022-07-12 (×2): 60 mg via ORAL
  Filled 2022-07-11 (×3): qty 1

## 2022-07-11 MED ORDER — TRAZODONE HCL 50 MG PO TABS: 25.0000 mg | ORAL_TABLET | Freq: Every evening | ORAL | Status: DC | PRN

## 2022-07-11 MED ORDER — ASPIRIN 81 MG PO TBEC
81.0000 mg | DELAYED_RELEASE_TABLET | Freq: Every day | ORAL | Status: DC
  Administered 2022-07-12 – 2022-07-14 (×3): 81 mg via ORAL
  Filled 2022-07-11 (×3): qty 1

## 2022-07-11 MED ORDER — ONDANSETRON HCL 4 MG/2ML IJ SOLN
4.0000 mg | Freq: Four times a day (QID) | INTRAMUSCULAR | Status: DC | PRN
  Administered 2022-07-12 – 2022-07-14 (×3): 4 mg via INTRAVENOUS
  Filled 2022-07-11 (×3): qty 2

## 2022-07-11 MED ORDER — MORPHINE SULFATE (PF) 4 MG/ML IV SOLN
4.0000 mg | Freq: Once | INTRAVENOUS | Status: AC
  Administered 2022-07-11: 4 mg via INTRAVENOUS
  Filled 2022-07-11: qty 1

## 2022-07-11 MED ORDER — HEPARIN BOLUS VIA INFUSION
3500.0000 [IU] | Freq: Once | INTRAVENOUS | Status: AC
  Administered 2022-07-11: 3500 [IU] via INTRAVENOUS
  Filled 2022-07-11: qty 3500

## 2022-07-11 MED ORDER — ATORVASTATIN CALCIUM 80 MG PO TABS
80.0000 mg | ORAL_TABLET | Freq: Every day | ORAL | Status: DC
  Administered 2022-07-12 – 2022-07-14 (×3): 80 mg via ORAL
  Filled 2022-07-11: qty 4
  Filled 2022-07-11 (×2): qty 1

## 2022-07-11 MED ORDER — ASPIRIN 81 MG PO CHEW: 324.0000 mg | CHEWABLE_TABLET | ORAL | Status: DC

## 2022-07-11 MED ORDER — BASAGLAR KWIKPEN 100 UNIT/ML ~~LOC~~ SOPN: 40.0000 [IU] | PEN_INJECTOR | Freq: Every day | SUBCUTANEOUS | Status: DC

## 2022-07-11 MED ORDER — ALBUTEROL SULFATE HFA 108 (90 BASE) MCG/ACT IN AERS: 2.0000 | INHALATION_SPRAY | RESPIRATORY_TRACT | Status: DC | PRN

## 2022-07-11 MED ORDER — ALPRAZOLAM 0.5 MG PO TABS: 0.2500 mg | ORAL_TABLET | Freq: Two times a day (BID) | ORAL | Status: DC | PRN

## 2022-07-11 MED ORDER — LISINOPRIL 10 MG PO TABS
10.0000 mg | ORAL_TABLET | Freq: Every day | ORAL | Status: DC
  Administered 2022-07-12: 10 mg via ORAL
  Filled 2022-07-11: qty 1

## 2022-07-11 MED ORDER — ASPIRIN 300 MG RE SUPP: 300.0000 mg | RECTAL | Status: DC

## 2022-07-11 MED ORDER — HYDROCODONE-ACETAMINOPHEN 5-325 MG PO TABS
1.0000 | ORAL_TABLET | Freq: Four times a day (QID) | ORAL | Status: DC | PRN
  Administered 2022-07-12 – 2022-07-14 (×9): 1 via ORAL
  Filled 2022-07-11 (×9): qty 1

## 2022-07-11 MED ORDER — HEPARIN (PORCINE) 25000 UT/250ML-% IV SOLN
1300.0000 [IU]/h | INTRAVENOUS | Status: DC
  Administered 2022-07-11: 750 [IU]/h via INTRAVENOUS
  Administered 2022-07-12: 1050 [IU]/h via INTRAVENOUS
  Administered 2022-07-13: 1200 [IU]/h via INTRAVENOUS
  Filled 2022-07-11 (×3): qty 250

## 2022-07-11 MED ORDER — CLONAZEPAM 0.5 MG PO TABS
0.5000 mg | ORAL_TABLET | Freq: Two times a day (BID) | ORAL | Status: DC | PRN
  Administered 2022-07-12 – 2022-07-14 (×5): 0.5 mg via ORAL
  Filled 2022-07-11 (×6): qty 1

## 2022-07-11 MED ORDER — MAGNESIUM HYDROXIDE 400 MG/5ML PO SUSP
30.0000 mL | Freq: Every day | ORAL | Status: DC | PRN
  Administered 2022-07-12: 30 mL via ORAL
  Filled 2022-07-11: qty 30

## 2022-07-11 MED ORDER — RANOLAZINE ER 500 MG PO TB12
1000.0000 mg | ORAL_TABLET | Freq: Two times a day (BID) | ORAL | Status: DC
  Administered 2022-07-12 – 2022-07-14 (×5): 1000 mg via ORAL
  Filled 2022-07-11 (×5): qty 2

## 2022-07-11 MED ORDER — NITROGLYCERIN 0.4 MG SL SUBL
0.4000 mg | SUBLINGUAL_TABLET | SUBLINGUAL | Status: DC | PRN
  Administered 2022-07-11 (×2): 0.4 mg via SUBLINGUAL
  Filled 2022-07-11 (×2): qty 1

## 2022-07-11 MED ORDER — GABAPENTIN 300 MG PO CAPS
900.0000 mg | ORAL_CAPSULE | Freq: Three times a day (TID) | ORAL | Status: DC
  Administered 2022-07-12 – 2022-07-14 (×9): 900 mg via ORAL
  Filled 2022-07-11 (×9): qty 3

## 2022-07-11 MED ORDER — METOPROLOL SUCCINATE ER 50 MG PO TB24
50.0000 mg | ORAL_TABLET | Freq: Every day | ORAL | Status: DC
  Administered 2022-07-12: 50 mg via ORAL
  Filled 2022-07-11: qty 1

## 2022-07-11 MED ORDER — EMPAGLIFLOZIN 25 MG PO TABS
25.0000 mg | ORAL_TABLET | Freq: Every day | ORAL | Status: DC
  Administered 2022-07-12 – 2022-07-14 (×3): 25 mg via ORAL
  Filled 2022-07-11 (×3): qty 1

## 2022-07-11 MED ORDER — TIZANIDINE HCL 2 MG PO TABS
2.0000 mg | ORAL_TABLET | Freq: Three times a day (TID) | ORAL | Status: DC | PRN
  Administered 2022-07-12 – 2022-07-14 (×5): 2 mg via ORAL
  Filled 2022-07-11 (×6): qty 1

## 2022-07-11 MED ORDER — ONDANSETRON HCL 4 MG/2ML IJ SOLN
4.0000 mg | Freq: Once | INTRAMUSCULAR | Status: AC
  Administered 2022-07-11: 4 mg via INTRAVENOUS
  Filled 2022-07-11: qty 2

## 2022-07-11 MED ORDER — HEPARIN SODIUM (PORCINE) 5000 UNIT/ML IJ SOLN
INTRAMUSCULAR | Status: AC
  Filled 2022-07-11: qty 1

## 2022-07-11 NOTE — ED Triage Notes (Signed)
Pt presents ambulatory to triage via POV with complaints of mid-sternal CP with radiation to her neck and jaw that started last night. Pt states she took 3 nitros last night with no relief. Rates the pain 8/10 with "heaviness". A&Ox4 at this time. Denies SOB.

## 2022-07-11 NOTE — Assessment & Plan Note (Addendum)
-   The patient will be admitted to a progressive unit bed. - We will continue him on IV heparin drip. - Will be placed on aspirin as well as as needed sublingual nitroglycerin and IV morphine sulfate for pain. - We will continue him on high-dose statin therapy. - We will resume beta-blocker therapy. - 2D echo and cardiology consult to be obtained. - Dr. Curt Bears was notified and is aware about the patient.

## 2022-07-11 NOTE — Assessment & Plan Note (Signed)
-   We will continue statin therapy and check fasting lipids in AM.

## 2022-07-11 NOTE — ED Notes (Addendum)
This RN received a critical result from the lab - trop [id number]. Acuity changed and patient taken to the next available bed. EDP and primary notified of the level.

## 2022-07-11 NOTE — ED Provider Notes (Signed)
[facility] Provider Note    Event Date/Time   First MD Initiated Contact with Patient 07/11/22 2142     (approximate)   History   Chest Pain   HPI  Jennifer [name] is a 62 y.o. female with past medical history significant for CAD with prior PCI, hypertension, hyperlipidemia, tobacco use, diabetes, marijuana use, chronic pain, who presents to the emergency department with chest pain.  States that her chest pain started last night whenever she was smoking a "dab of marijuana" with a friend.  States that she has not felt well since that time.  Went to bed with some chest pain.  This morning woke up and had ongoing chest pain with multiple episodes of nausea and vomiting.  Ongoing chest pain at this time but states that it feels better than it did earlier.  Ongoing tobacco use.  Marijuana use.  Denies any history of DVT or PE.  Complaining of pain to her left lower leg but states that this is her chronic pain.  Denies any falls or trauma.  States that she has been compliant with her Brilinta however she did not take it for a couple of days when they were trying to do a procedure to her left leg that was unsuccessful.     Physical Exam   Triage Vital Signs: ED Triage Vitals  Enc Vitals Group     BP 07/11/22 2001 (!) 177/112     Pulse Rate 07/11/22 2001 78     Resp 07/11/22 2001 20     Temp 07/11/22 2001 98.2 F (36.8 C)     Temp Source 07/11/22 2001 Oral     SpO2 07/11/22 2001 98 %     Weight 07/11/22 2002 126 lb 12.2 oz (57.5 kg)     Height 07/11/22 2002 5\' 2"  (1.575 m)     Head Circumference --      Peak Flow --      Pain Score 07/11/22 1955 8     Pain Loc --      Pain Edu? --      Excl. in [place]? --     Most recent vital signs: Vitals:   07/11/22 2230 07/11/22 2300  BP: (!) 131/93 125/80  Pulse: 87 70  Resp: (!) 25 13  Temp:    SpO2: 99% 97%    Physical Exam Constitutional:      Appearance: She is well-developed.  HENT:     Head: Atraumatic.   Eyes:     Conjunctiva/sclera: Conjunctivae normal.  Cardiovascular:     Rate and Rhythm: Regular rhythm.  Pulmonary:     Effort: No respiratory distress.  Abdominal:     General: There is no distension.     Palpations: Abdomen is soft.  Musculoskeletal:        General: Normal range of motion.     Cervical back: Normal range of motion.  Skin:    General: Skin is warm.     Capillary Refill: Capillary refill takes less than 2 seconds.  Neurological:     Mental Status: She is alert. Mental status is at baseline.     IMPRESSION / MDM / ASSESSMENT AND PLAN / ED COURSE  I reviewed the triage vital signs and the nursing notes.  Differential diagnosis including ACS, pneumonia, dissection, pulmonary embolism  EKG  I, Nathaniel Man, the attending physician, personally viewed and interpreted this ECG.  EKG showed normal sinus rhythm.  Normal intervals.  Mild ST  elevation with T waves that are inverted to the inferior leads.  Does not meet STEMI criteria.  T waves that are inverted in the lateral leads.  Repeat EKG obtained with no significant change when compared to initial EKG.  RADIOLOGY I independently reviewed imaging, my interpretation of imaging: Chest x-ray with no focal findings consistent with pneumonia.  No widened mediastinum.  LABS (all labs ordered are listed, but only abnormal results are displayed) Labs interpreted as -    Labs Reviewed  BASIC METABOLIC PANEL - Abnormal; Notable for the following components:      Result Value   Glucose, Bld 254 (*)    All other components within normal limits  CBC - Abnormal; Notable for the following components:   WBC 17.0 (*)    All other components within normal limits  TROPONIN I (HIGH SENSITIVITY) - Abnormal; Notable for the following components:   Troponin I (High Sensitivity) [id number] (*)    All other components within normal limits  TROPONIN I (HIGH SENSITIVITY) - Abnormal; Notable for the following components:    Troponin I (High Sensitivity) 12,616 (*)    All other components within normal limits  APTT  PROTIME-INR  HIV ANTIBODY (ROUTINE TESTING W REFLEX)  LIPOPROTEIN A (LPA)  BASIC METABOLIC PANEL  LIPID PANEL  CBC  PROTIME-INR    TREATMENT  Aspirin, nitroglycerin, morphine, heparin bolus and infusion  MDM    Clinical picture most consistent with NSTEMI.  Ongoing chest pain at this time.  Patient was given aspirin and nitroglycerin.  Consulted cardiology and discussed patient's case with Dr. Curt Bears, reviewed the patient's EKG, recommended heparin bolus and infusion, nitroglycerin and pain control.  May need a nitro infusion if no improvement of her pain if ongoing pain would consider cardiac catheterization.  On reevaluation patient states that her pain has improved after 2 nitroglycerin but is having ongoing pain.  Given IV morphine.  Repeat EKG obtained without progression of any ST elevation in the inferior leads.  Consulted hospitalist for admission for NSTEMI.   PROCEDURES:  Critical Care performed: yes  .Critical Care  Performed by: Nathaniel Man, MD Authorized by: Nathaniel Man, MD   Critical care provider statement:    Critical care time (minutes):  45   Critical care time was exclusive of:  Separately billable procedures and treating other patients   Critical care was necessary to treat or prevent imminent or life-threatening deterioration of the following conditions:  Cardiac failure   Critical care was time spent personally by me on the following activities:  Development of treatment plan with patient or surrogate, discussions with consultants, evaluation of patient's response to treatment, examination of patient, ordering and review of laboratory studies, ordering and review of radiographic studies, ordering and performing treatments and interventions, pulse oximetry, re-evaluation of patient's condition and review of old charts   Patient's presentation is most  consistent with acute presentation with potential threat to life or bodily function.   MEDICATIONS ORDERED IN ED: Medications  nitroGLYCERIN (NITROSTAT) SL tablet 0.4 mg (0.4 mg Sublingual Given 07/11/22 2215)  heparin ADULT infusion 100 units/mL (25000 units/265mL) (750 Units/hr Intravenous New Bag/Given 07/11/22 2302)  heparin 5000 UNIT/ML injection (  Not Given 07/11/22 2309)  aspirin EC tablet 81 mg (has no administration in time range)  HYDROcodone-acetaminophen (NORCO/VICODIN) 5-325 MG per tablet 1 tablet (has no administration in time range)  atorvastatin (LIPITOR) tablet 80 mg (has no administration in time range)  lisinopril (ZESTRIL) tablet 10 mg (has no administration in  time range)  metoprolol succinate (TOPROL-XL) 24 hr tablet 50 mg (has no administration in time range)  nitroGLYCERIN (NITROSTAT) SL tablet 0.4 mg (has no administration in time range)  ranolazine (RANEXA) 12 hr tablet 1,000 mg (has no administration in time range)  QUEtiapine (SEROQUEL) tablet 200 mg (has no administration in time range)  empagliflozin (JARDIANCE) tablet 25 mg (has no administration in time range)  Basaglar KwikPen KwikPen 40 Units (has no administration in time range)  ticagrelor (BRILINTA) tablet 60 mg (has no administration in time range)  clonazePAM (KLONOPIN) tablet 0.5 mg (has no administration in time range)  gabapentin (NEURONTIN) tablet 600 mg (has no administration in time range)  tiZANidine (ZANAFLEX) tablet 2 mg (has no administration in time range)  albuterol (VENTOLIN HFA) 108 (90 Base) MCG/ACT inhaler 2 puff (has no administration in time range)  aspirin chewable tablet 324 mg (has no administration in time range)    Or  aspirin suppository 300 mg (has no administration in time range)  aspirin EC tablet 81 mg (has no administration in time range)  nitroGLYCERIN (NITROSTAT) SL tablet 0.4 mg (has no administration in time range)  acetaminophen (TYLENOL) tablet 650 mg (has no  administration in time range)  ondansetron (ZOFRAN) injection 4 mg (has no administration in time range)  ALPRAZolam (XANAX) tablet 0.25 mg (has no administration in time range)  magnesium hydroxide (MILK OF MAGNESIA) suspension 30 mL (has no administration in time range)  traZODone (DESYREL) tablet 25 mg (has no administration in time range)  aspirin chewable tablet 324 mg (324 mg Oral Given 07/11/22 2158)  heparin bolus via infusion 3,500 Units (3,500 Units Intravenous Bolus from Bag 07/11/22 2302)  morphine (PF) 4 MG/ML injection 4 mg (4 mg Intravenous Given 07/11/22 2305)  ondansetron (ZOFRAN) injection 4 mg (4 mg Intravenous Given 07/11/22 2305)    FINAL CLINICAL IMPRESSION(S) / ED DIAGNOSES   Final diagnoses:  NSTEMI (non-ST elevated myocardial infarction) ([place])     Rx / DC Orders   ED Discharge Orders     None        Note:  This document was prepared using Dragon voice recognition software and may include unintentional dictation errors.   Nathaniel Man, MD 07/11/22 [phone]

## 2022-07-11 NOTE — H&P (Incomplete)
[place]   PATIENT NAME: Jennifer [name]    MR#:  [id number]  DATE OF BIRTH:  02/17/1961  DATE OF ADMISSION:  07/11/2022  PRIMARY CARE PHYSICIAN: Pcp, No   Patient is coming from: Home  REQUESTING/REFERRING PHYSICIAN: Nathaniel Man, MD  CHIEF COMPLAINT:   Chief Complaint  Patient presents with  . Chest Pain    HISTORY OF PRESENT ILLNESS:  Jennifer [name] is a 62 y.o. Caucasian female with medical history significant for coronary artery disease status post PCI and stents, chronic bronchitis, osteoarthritis, anxiety, bipolar disorder, dyslipidemia, hypertension, migraine, type 2 diabetes mellitus, seizure disorder and OSA, who presented to the ER with acute onset of chest pain and which started last night when she was smoking a dab of marijuana with her friends.  She has not felt well since then.  She went to bed with some chest pain and in the morning woke up with ongoing chest pain.  She had multiple episodes of nausea and vomiting associated with her pain.  She continues to smoke cigarettes.  No further radiation of her pain.  No reported dyspnea or palpitations.  No diarrhea or melena or bright red being per rectum.  No other bleeding diathesis.  She has been compliant with her Brilinta however she has not taken it for couple days.  She stated that she has been having left leg pain and was expecting to have a procedure in her left leg.  ED Course: When the patient came to the ER, BP was 177/112 with otherwise normal vital signs.  Labs revealed hyperglycemia of 254 with otherwise unremarkable BMP.  High sensitive troponin I was 9887 and later 12,616.  CBC showed leukocytosis of 17.  Coag profile was within normal EKG as reviewed by me : EKG showed normal sinus rhythm with a rate of 73 with T wave inversion inferiorly and inferior Q waves.Repeat EKG showed normal sinus rhythm with a rate of 76 without significant changes.   Latest EKG so far showed sinus rhythm with a rate of 87  with low voltage QRS and borderline prolonged QT interval with QTc of 490 MS. Imaging: Two-view chest x-ray showed no acute cardiopulmonary disease.  The patient was given 4 mg of IV morphine sulfate, 4 mg of IV Zofran, IV heparin bolus and drip, 4 IV aspirin, 2 sublingual nitroglycerin.  Her pain has been better.  She will be admitted to a progressive unit bed for further evaluation and management. PAST MEDICAL HISTORY:   Past Medical History:  Diagnosis Date  . Anxiety   . Arthritis    "qwhere" (06/14/2014)  . Bipolar disorder ([place])   . CAD (coronary artery disease)    a. 2008 DES->RCA; b. 2012 PCI: 57mRCA s/p DES; b. 05/2014 DES-> 90 dRCA; c. 11/16 PCI: dRCA ISR->DES;  d. 4/18 PCI: p-mRCA 95% s/p PCI/DES; e. 02/2017 Cath: patent dRCA stent, RPDA 90ost->Med Rx; f. 05/2017 PTCA mRCA 2/2 ISR; g. 05/2019 Cath: stable dzs; f. 08/2020 PCI: LM nl, LAD nl, D1 40, LCX 60p/m, OM1 50, RCA 20p/m, 144m/d (3.0x18 & 3.0x15 Resolute Onyx DESs), PDA 80, RPDA 80, RPAV 100. EF 50-55.  [name] Chronic bronchitis ([place])    "get it q yr"  . Daily headache    "when my blood pressure is up" (06/14/2014)  . Depression   . Diastolic dysfunction    a. 02/2017 Echo: EF 55-60%, no rwma, Gr1 DD, nl RV fxn.  . Diastolic dysfunction    a. 08/2020 Echo:  EF 55-60%, mod LVH, grI DD. NL RV fxn.  [name] GERD (gastroesophageal reflux disease)   . Heart murmur   . High cholesterol   . History of stomach ulcers   . Hypertension   . Insomnia   . Kidney stones   . Migraine    "haven't had them in a good while; did get them 1-2 times/yr" (06/14/2014)  . Myocardial infarction ([place]) 2008; ~ 2012  . Pneumonia    "get it 1-2 times/year" (06/14/2014)  . RLS (restless legs syndrome)   . Seizures ([place])   . Sleep apnea    "they want me to do the lab but I haven't" (06/14/2014)  . Stroke [facility])    "they say I've had several" (06/14/2014)  . Tobacco abuse   . Type II diabetes mellitus ([place])   . Urinary, incontinence, stress female     PAST  SURGICAL HISTORY:   Past Surgical History:  Procedure Laterality Date  . ABDOMINAL HYSTERECTOMY  1984   "I've got 1 ovary left"  . CARDIAC CATHETERIZATION N/A 02/26/2015   Procedure: Left Heart Cath and Coronary Angiography;  Surgeon: Wellington Hampshire, MD;  Location: [place] [facility];  Service: Cardiovascular;  Laterality: N/A;  . CARDIAC CATHETERIZATION N/A 02/26/2015   Procedure: Coronary Stent Intervention;  Surgeon: Wellington Hampshire, MD;  Location: [place] [facility];  Service: Cardiovascular;  Laterality: N/A;  . CESAREAN SECTION  1984  . CORONARY ANGIOPLASTY WITH STENT PLACEMENT  2008; ~ 2012   "1; 1"  . CORONARY BALLOON ANGIOPLASTY N/A 06/09/2017   Procedure: CORONARY BALLOON ANGIOPLASTY;  Surgeon: Nelva Bush, MD;  Location: [place] [facility];  Service: Cardiovascular;  Laterality: N/A;  . CORONARY STENT INTERVENTION N/A 08/04/2016   Procedure: Coronary Stent Intervention;  Surgeon: Wellington Hampshire, MD;  Location: [place] [facility];  Service: Cardiovascular;  Laterality: N/A;  . CORONARY/GRAFT ACUTE MI REVASCULARIZATION N/A 09/05/2020   Procedure: Coronary/Graft Acute MI Revascularization;  Surgeon: Nelva Bush, MD;  Location: [place] [facility];  Service: Cardiovascular;  Laterality: N/A;  . DILATION AND CURETTAGE OF UTERUS    . EXTRACORPOREAL SHOCK WAVE LITHOTRIPSY  X 2  . I & D EXTREMITY Left 02/09/2018   Procedure: IRRIGATION AND DEBRIDEMENT EXTREMITY;  Surgeon: Herbert Pun, MD;  Location: [facility] ORS;  Service: General;  Laterality: Left;  . KNEE ARTHROSCOPY Left   . LEFT HEART CATH Bilateral 08/04/2016   Procedure: Left Heart Cath poss PCI;  Surgeon: Wellington Hampshire, MD;  Location: [place] [facility];  Service: Cardiovascular;  Laterality: Bilateral;  . LEFT HEART CATH AND CORONARY ANGIOGRAPHY Left 02/19/2017   Procedure: LEFT HEART CATH AND CORONARY ANGIOGRAPHY;  Surgeon: Minna Merritts, MD;  Location: [place] [facility];  Service:  Cardiovascular;  Laterality: Left;  . LEFT HEART CATH AND CORONARY ANGIOGRAPHY Left 06/09/2017   Procedure: LEFT HEART CATH AND CORONARY ANGIOGRAPHY;  Surgeon: Nelva Bush, MD;  Location: [place] [facility];  Service: Cardiovascular;  Laterality: Left;  . LEFT HEART CATH AND CORONARY ANGIOGRAPHY N/A 04/10/2020   Procedure: LEFT HEART CATH AND CORONARY ANGIOGRAPHY;  Surgeon: Wellington Hampshire, MD;  Location: [place] [facility];  Service: Cardiovascular;  Laterality: N/A;  . LEFT HEART CATH AND CORONARY ANGIOGRAPHY N/A 09/05/2020   Procedure: LEFT HEART CATH AND CORONARY ANGIOGRAPHY;  Surgeon: Nelva Bush, MD;  Location: [place] [facility];  Service: Cardiovascular;  Laterality: N/A;  . LEFT HEART CATHETERIZATION WITH CORONARY ANGIOGRAM N/A 06/15/2014   Procedure:  LEFT HEART CATHETERIZATION WITH CORONARY ANGIOGRAM;  Surgeon: Jettie Booze, MD;  Location: [facility] CATH LAB;  Service: Cardiovascular;  Laterality: N/A;  . PERCUTANEOUS CORONARY STENT INTERVENTION (PCI-S)  06/15/2014   Procedure: PERCUTANEOUS CORONARY STENT INTERVENTION (PCI-S);  Surgeon: Jettie Booze, MD;  Location: [facility] CATH LAB;  Service: Cardiovascular;;  . TONSILLECTOMY    . TUBAL LIGATION      SOCIAL HISTORY:   Social History   Tobacco Use  . Smoking status: Every Day    Packs/day: 1.00    Years: 35.00    Additional pack years: 0.00    Total pack years: 35.00    Types: Cigarettes  . Smokeless tobacco: Never  Substance Use Topics  . Alcohol use: Yes    Alcohol/week: 0.0 standard drinks of alcohol    Comment: occ    FAMILY HISTORY:   Family History  Problem Relation Age of Onset  . Lung cancer Mother   . Cirrhosis Father     DRUG ALLERGIES:   Allergies  Allergen Reactions  . Buprenorphine Hcl     Nausea and vomiting  . Carbamazepine Anaphylaxis and Other (See Comments)    Blood dyscrasia when on with Lithium  . Lithium Other (See Comments)    Blood dyscrasia  . Lodine [Etodolac]  Shortness Of Breath, Diarrhea, Nausea And Vomiting and Rash  . Methocarbamol Other (See Comments) and Shortness Of Breath    Unknown  Rash, tachypnea  Rash, tachypnea    Unknown Rash, tachypnea  . Acetaminophen Other (See Comments)  . Codeine Other (See Comments)    Upset stomach  . Levofloxacin In D5w Swelling and Other (See Comments)    "Salty" sensation in mouth. "Hard time to explain it"  . Other Other (See Comments)  . Oxycodone-Acetaminophen Other (See Comments)  . Tape Other (See Comments)    Welps, blister on skin  . Wound Dressing Adhesive   . Relafen [Nabumetone] Diarrhea, Nausea And Vomiting and Rash    REVIEW OF SYSTEMS:   ROS As per history of present illness. All pertinent systems were reviewed above. Constitutional, HEENT, cardiovascular, respiratory, GI, GU, musculoskeletal, neuro, psychiatric, endocrine, integumentary and hematologic systems were reviewed and are otherwise negative/unremarkable except for positive findings mentioned above in the HPI.   MEDICATIONS AT HOME:   Prior to Admission medications   Medication Sig Start Date End Date Taking? Authorizing Provider  Alirocumab (PRALUENT) 150 MG/ML SOAJ Inject 150 mg into the skin every 14 (fourteen) days. 06/09/22  Yes Minna Merritts, MD  aspirin 81 MG EC tablet Take 1 tablet (81 mg total) by mouth daily. 09/06/20  Yes Fritzi Mandes, MD  atorvastatin (LIPITOR) 80 MG tablet Take 1 tablet (80 mg total) by mouth daily. 06/09/22  Yes Gollan, Kathlene November, MD  clonazePAM (KLONOPIN) 0.5 MG tablet Take 0.5 mg by mouth 2 (two) times daily as needed for anxiety.   Yes [provider]  empagliflozin (JARDIANCE) 25 MG TABS tablet Take 1 tablet (25 mg total) by mouth daily. 06/09/22  Yes Gollan, Kathlene November, MD  gabapentin (NEURONTIN) 600 MG tablet Take 1.5 mg by mouth 4 (four) times daily. 11/13/20  Yes [provider]  HYDROcodone-acetaminophen (NORCO/VICODIN) 5-325 MG tablet Take 1 tablet by mouth every 6  (six) hours as needed.   Yes [provider]  ibuprofen (ADVIL) 800 MG tablet Take 800 mg by mouth every 6 (six) hours as needed.   Yes [provider]  lisinopril (ZESTRIL) 10 MG tablet Take 10 mg  by mouth daily.   Yes [provider]  metoprolol succinate (TOPROL-XL) 50 MG 24 hr tablet Take 1 tablet (50 mg total) by mouth daily. 06/09/22  Yes Minna Merritts, MD  nortriptyline (PAMELOR) 25 MG capsule Take 25 mg by mouth at bedtime. 06/06/22  Yes [provider]  QUEtiapine (SEROQUEL) 200 MG tablet Take 1 tablet (200 mg total) by mouth at bedtime. 09/06/20  Yes Fritzi Mandes, MD  ranolazine (RANEXA) 1000 MG SR tablet Take 1 tablet (1,000 mg total) by mouth 2 (two) times daily. 06/09/22  Yes Minna Merritts, MD  ticagrelor (BRILINTA) 60 MG TABS tablet Take 1 tablet (60 mg total) by mouth 2 (two) times daily. Please call [phone] to schedule an appointment for further refills. Thank you. 06/09/22  Yes Gollan, Kathlene November, MD  tiZANidine (ZANAFLEX) 2 MG tablet Take 2 mg by mouth every 8 (eight) hours as needed. 01/15/22  Yes [provider]  TRULICITY [id number] SOPN Inject 0.75 mg into the skin once a week. 06/03/22  Yes [provider]  albuterol (PROVENTIL HFA;VENTOLIN HFA) 108 (90 Base) MCG/ACT inhaler Inhale 2 puffs into the lungs every 4 (four) hours as needed for wheezing or shortness of breath.    [provider]  baclofen (LIORESAL) 10 MG tablet Take 10 mg by mouth 3 (three) times daily as needed for muscle spasms. Patient not taking: Reported on 06/09/2022    [provider]  blood glucose meter kit and supplies KIT Dispense based on patient and insurance preference. Use up to four times daily as directed. (FOR ICD-9 250.00, 250.01). Patient not taking: Reported on 06/09/2022 04/11/20   Nicole Kindred A, DO  Blood Glucose Monitoring Suppl W/DEVICE KIT Test 1-2 times daily; E11.65 diagnosis Patient not taking: Reported on  06/09/2022 03/02/15   Rubbie Battiest, RN  diclofenac Sodium (VOLTAREN ARTHRITIS PAIN) 1 % GEL Apply 4 g topically 4 (four) times daily. 09/12/21   Teodoro Spray, PA  doxycycline (MONODOX) 100 MG capsule daily. Patient not taking: Reported on 06/09/2022    [provider]  furosemide (LASIX) 20 MG tablet TAKE 1 TABLET BY MOUTH EVERY DAY AS NEEDED FOR FLUID RETENTION OR SWELLING Patient not taking: Reported on 07/11/2022 04/02/22   Wellington Hampshire, MD  Insulin Glargine (BASAGLAR KWIKPEN) 100 UNIT/ML Inject 40 Units into the skin daily. Patient not taking: Reported on 06/09/2022 09/06/20   Fritzi Mandes, MD  Insulin Pen Needle (UNIFINE PENTIPS) 31G X 5 MM MISC use with basaglar Patient not taking: Reported on 06/09/2022 09/06/20   Fritzi Mandes, MD  lidocaine (LIDODERM) 5 % Place 1 patch onto the skin every 12 (twelve) hours. Remove & Discard patch within 12 hours or as directed by MD Patient not taking: Reported on 06/09/2022 09/12/21 09/12/22  Teodoro Spray, PA  nitroGLYCERIN (NITROSTAT) 0.4 MG SL tablet Place 1 tablet (0.4 mg total) under the tongue every 5 (five) minutes as needed for chest pain. 09/06/20   Fritzi Mandes, MD  pantoprazole (PROTONIX) 40 MG tablet Take 40 mg by mouth daily. Patient not taking: Reported on 07/11/2022 10/28/20   [provider]  permethrin (ELIMITE) 5 % cream Apply 1 Application topically once. Patient not taking: Reported on 07/11/2022 10/30/21   [provider]  traMADol (ULTRAM) 50 MG tablet Take 50 mg by mouth 3 (three) times daily as needed. Patient not taking: Reported on 06/09/2022 10/30/21   [provider]      VITAL SIGNS:  Blood pressure [name])  156/103, pulse 83, temperature 98.2 F (36.8 C), temperature source Oral, resp. rate 16, height 5\' 2"  (1.575 m), weight 57.5 kg, SpO2 98 %.  PHYSICAL EXAMINATION:  Physical Exam  GENERAL:  62 y.o.-year-old Caucasian female patient lying in the bed with no acute distress.  EYES:  Pupils equal, round, reactive to light and accommodation. No scleral icterus. Extraocular muscles intact.  HEENT: Head atraumatic, normocephalic. Oropharynx and nasopharynx clear.  NECK:  Supple, no jugular venous distention. No thyroid enlargement, no tenderness.  LUNGS: Normal breath sounds bilaterally, no wheezing, rales,rhonchi or crepitation. No use of accessory muscles of respiration.  CARDIOVASCULAR: Regular rate and rhythm, S1, S2 normal. No murmurs, rubs, or gallops.  ABDOMEN: Soft, nondistended, nontender. Bowel sounds present. No organomegaly or mass.  EXTREMITIES: No pedal edema, cyanosis, or clubbing.  NEUROLOGIC: Cranial nerves II through XII are intact. Muscle strength 5/5 in all extremities. Sensation intact. Gait not checked.  PSYCHIATRIC: The patient is alert and oriented x 3.  Normal affect and good eye contact. SKIN: No obvious rash, lesion, or ulcer.   LABORATORY PANEL:   CBC Recent Labs  Lab 07/11/22 2008  WBC 17.0*  HGB 14.4  HCT 43.6  PLT 333   ------------------------------------------------------------------------------------------------------------------  Chemistries  Recent Labs  Lab 07/11/22 2008  NA 140  K 3.8  CL 103  CO2 26  GLUCOSE 254*  BUN 19  CREATININE 0.57  CALCIUM 9.6   ------------------------------------------------------------------------------------------------------------------  Cardiac Enzymes No results for input(s): "TROPONINI" in the last 168 hours. ------------------------------------------------------------------------------------------------------------------  RADIOLOGY:  DG Chest 2 View  Result Date: 07/11/2022 CLINICAL DATA:  CP EXAM: CHEST - 2 VIEW COMPARISON:  09/04/2020 FINDINGS: Lungs are clear. Heart size and mediastinal contours are within normal limits. Coronary stent. No effusion.  No pneumothorax. Right shoulder DJD with orthopedic anchor. Vertebral endplate spurring at multiple levels in the mid thoracic  spine. IMPRESSION: No acute cardiopulmonary disease. Electronically Signed   By: Lucrezia Europe M.D.   On: 07/11/2022 20:12      IMPRESSION AND PLAN:  Assessment and Plan: No notes have been filed under this hospital service. Service: Hospitalist      DVT prophylaxis: Lovenox***  Advanced Care Planning:  Code Status: full code***  Family Communication:  The plan of care was discussed in details with the patient (and family). I answered all questions. The patient agreed to proceed with the above mentioned plan. Further management will depend upon hospital course. Disposition Plan: Back to previous home environment Consults called: none***  All the records are reviewed and case discussed with ED provider.  Status is: Inpatient {Inpatient:23812}   At the time of the admission, it appears that the appropriate admission status for this patient is inpatient.  This is judged to be reasonable and necessary in order to provide the required intensity of service to ensure the patient's safety given the presenting symptoms, physical exam findings and initial radiographic and laboratory data in the context of comorbid conditions.  The patient requires inpatient status due to high intensity of service, high risk of further deterioration and high frequency of surveillance required.  I certify that at the time of admission, it is my clinical judgment that the patient will require inpatient hospital care extending more than 2 midnights.                            Dispo: The patient is from: Home  Anticipated d/c is to: Home              Patient currently is not medically stable to d/c.              Difficult to place patient: No  Christel Mormon M.D on 07/11/2022 at 11:02 PM  [facility]   From 7 PM-7 AM, contact night-coverage [email or web address]  CC: Primary care physician; Pcp, No

## 2022-07-11 NOTE — ED Notes (Signed)
Dr Jori Moll notified of pt's high 2nd trop

## 2022-07-11 NOTE — H&P (Signed)
[facility]   PATIENT NAME: Jennifer [name]    MR#:  [id number]  DATE OF BIRTH:  1960/09/23  DATE OF ADMISSION:  07/11/2022  PRIMARY CARE PHYSICIAN: Pcp, No   Patient is coming from: Home  REQUESTING/REFERRING PHYSICIAN: Nathaniel Man, MD  CHIEF COMPLAINT:   Chief Complaint  Patient presents with   Chest Pain    HISTORY OF PRESENT ILLNESS:  STEFENIE [name] is a 62 y.o. female with medical history significant for ***  ED Course: *** EKG as reviewed by me : *** Imaging: *** PAST MEDICAL HISTORY:   Past Medical History:  Diagnosis Date   Anxiety    Arthritis    "qwhere" (06/14/2014)   Bipolar disorder ([place])    CAD (coronary artery disease)    a. 2008 DES->RCA; b. 2012 PCI: 12mRCA s/p DES; b. 05/2014 DES-> 90 dRCA; c. 11/16 PCI: dRCA ISR->DES;  d. 4/18 PCI: p-mRCA 95% s/p PCI/DES; e. 02/2017 Cath: patent dRCA stent, RPDA 90ost->Med Rx; f. 05/2017 PTCA mRCA 2/2 ISR; g. 05/2019 Cath: stable dzs; f. 08/2020 PCI: LM nl, LAD nl, D1 40, LCX 60p/m, OM1 50, RCA 20p/m, 120m/d (3.0x18 & 3.0x15 Resolute Onyx DESs), PDA 80, RPDA 80, RPAV 100. EF 50-55.   Chronic bronchitis ([place])    "get it q yr"   Daily headache    "when my blood pressure is up" (06/14/2014)   Depression    Diastolic dysfunction    a. 02/2017 Echo: EF 55-60%, no rwma, Gr1 DD, nl RV fxn.   Diastolic dysfunction    a. 08/2020 Echo: EF 55-60%, mod LVH, grI DD. NL RV fxn.   GERD (gastroesophageal reflux disease)    Heart murmur    High cholesterol    History of stomach ulcers    Hypertension    Insomnia    Kidney stones    Migraine    "haven't had them in a good while; did get them 1-2 times/yr" (06/14/2014)   Myocardial infarction ([place]) 2008; ~ 2012   Pneumonia    "get it 1-2 times/year" (06/14/2014)   RLS (restless legs syndrome)    Seizures ([place])    Sleep apnea    "they want me to do the lab but I haven't" (06/14/2014)   Stroke [facility])    "they say I've had several" (06/14/2014)   Tobacco abuse    Type II  diabetes mellitus (HCC)    Urinary, incontinence, stress female     PAST SURGICAL HISTORY:   Past Surgical History:  Procedure Laterality Date   ABDOMINAL HYSTERECTOMY  1984   "I've got 1 ovary left"   CARDIAC CATHETERIZATION N/A 02/26/2015   Procedure: Left Heart Cath and Coronary Angiography;  Surgeon: Wellington Hampshire, MD;  Location: [place] [facility];  Service: Cardiovascular;  Laterality: N/A;   CARDIAC CATHETERIZATION N/A 02/26/2015   Procedure: Coronary Stent Intervention;  Surgeon: Wellington Hampshire, MD;  Location: [place] [facility];  Service: Cardiovascular;  Laterality: N/A;   CESAREAN SECTION  1984   CORONARY ANGIOPLASTY WITH STENT PLACEMENT  2008; ~ 2012   "1; 1"   CORONARY BALLOON ANGIOPLASTY N/A 06/09/2017   Procedure: CORONARY BALLOON ANGIOPLASTY;  Surgeon: Nelva Bush, MD;  Location: [place] [facility];  Service: Cardiovascular;  Laterality: N/A;   CORONARY STENT INTERVENTION N/A 08/04/2016   Procedure: Coronary Stent Intervention;  Surgeon: Wellington Hampshire, MD;  Location: [place] [facility];  Service: Cardiovascular;  Laterality: N/A;   CORONARY/GRAFT ACUTE MI REVASCULARIZATION  N/A 09/05/2020   Procedure: Coronary/Graft Acute MI Revascularization;  Surgeon: Nelva Bush, MD;  Location: [place] [facility];  Service: Cardiovascular;  Laterality: N/A;   DILATION AND CURETTAGE OF UTERUS     EXTRACORPOREAL SHOCK WAVE LITHOTRIPSY  X 2   I & D EXTREMITY Left 02/09/2018   Procedure: IRRIGATION AND DEBRIDEMENT EXTREMITY;  Surgeon: Herbert Pun, MD;  Location: [facility] ORS;  Service: General;  Laterality: Left;   KNEE ARTHROSCOPY Left    LEFT HEART CATH Bilateral 08/04/2016   Procedure: Left Heart Cath poss PCI;  Surgeon: Wellington Hampshire, MD;  Location: [place] [facility];  Service: Cardiovascular;  Laterality: Bilateral;   LEFT HEART CATH AND CORONARY ANGIOGRAPHY Left 02/19/2017   Procedure: LEFT HEART CATH AND CORONARY ANGIOGRAPHY;  Surgeon: Minna Merritts, MD;  Location: [place] [facility];  Service: Cardiovascular;  Laterality: Left;   LEFT HEART CATH AND CORONARY ANGIOGRAPHY Left 06/09/2017   Procedure: LEFT HEART CATH AND CORONARY ANGIOGRAPHY;  Surgeon: Nelva Bush, MD;  Location: [place] [facility];  Service: Cardiovascular;  Laterality: Left;   LEFT HEART CATH AND CORONARY ANGIOGRAPHY N/A 04/10/2020   Procedure: LEFT HEART CATH AND CORONARY ANGIOGRAPHY;  Surgeon: Wellington Hampshire, MD;  Location: [place] [facility];  Service: Cardiovascular;  Laterality: N/A;   LEFT HEART CATH AND CORONARY ANGIOGRAPHY N/A 09/05/2020   Procedure: LEFT HEART CATH AND CORONARY ANGIOGRAPHY;  Surgeon: Nelva Bush, MD;  Location: [place] [facility];  Service: Cardiovascular;  Laterality: N/A;   LEFT HEART CATHETERIZATION WITH CORONARY ANGIOGRAM N/A 06/15/2014   Procedure: LEFT HEART CATHETERIZATION WITH CORONARY ANGIOGRAM;  Surgeon: Jettie Booze, MD;  Location: [facility] CATH LAB;  Service: Cardiovascular;  Laterality: N/A;   PERCUTANEOUS CORONARY STENT INTERVENTION (PCI-S)  06/15/2014   Procedure: PERCUTANEOUS CORONARY STENT INTERVENTION (PCI-S);  Surgeon: Jettie Booze, MD;  Location: [facility] CATH LAB;  Service: Cardiovascular;;   TONSILLECTOMY     TUBAL LIGATION      SOCIAL HISTORY:   Social History   Tobacco Use   Smoking status: Every Day    Packs/day: 1.00    Years: 35.00    Additional pack years: 0.00    Total pack years: 35.00    Types: Cigarettes   Smokeless tobacco: Never  Substance Use Topics   Alcohol use: Yes    Alcohol/week: 0.0 standard drinks of alcohol    Comment: occ    FAMILY HISTORY:   Family History  Problem Relation Age of Onset   Lung cancer Mother    Cirrhosis Father     DRUG ALLERGIES:   Allergies  Allergen Reactions   Buprenorphine Hcl     Nausea and vomiting   Carbamazepine Anaphylaxis and Other (See Comments)    Blood dyscrasia when on with Lithium   Lithium Other (See Comments)     Blood dyscrasia   Lodine [Etodolac] Shortness Of Breath, Diarrhea, Nausea And Vomiting and Rash   Methocarbamol Other (See Comments) and Shortness Of Breath    Unknown  Rash, tachypnea  Rash, tachypnea    Unknown Rash, tachypnea   Acetaminophen Other (See Comments)   Codeine Other (See Comments)    Upset stomach   Levofloxacin In D5w Swelling and Other (See Comments)    "Salty" sensation in mouth. "Hard time to explain it"   Other Other (See Comments)   Oxycodone-Acetaminophen Other (See Comments)   Tape Other (See Comments)    Welps, blister on skin   Wound Dressing Adhesive  Relafen [Nabumetone] Diarrhea, Nausea And Vomiting and Rash    REVIEW OF SYSTEMS:   ROS As per history of present illness. All pertinent systems were reviewed above. Constitutional, HEENT, cardiovascular, respiratory, GI, GU, musculoskeletal, neuro, psychiatric, endocrine, integumentary and hematologic systems were reviewed and are otherwise negative/unremarkable except for positive findings mentioned above in the HPI.   MEDICATIONS AT HOME:   Prior to Admission medications   Medication Sig Start Date End Date Taking? Authorizing Provider  Alirocumab (PRALUENT) 150 MG/ML SOAJ Inject 150 mg into the skin every 14 (fourteen) days. 06/09/22  Yes Minna Merritts, MD  aspirin 81 MG EC tablet Take 1 tablet (81 mg total) by mouth daily. 09/06/20  Yes Fritzi Mandes, MD  atorvastatin (LIPITOR) 80 MG tablet Take 1 tablet (80 mg total) by mouth daily. 06/09/22  Yes Gollan, Kathlene November, MD  clonazePAM (KLONOPIN) 0.5 MG tablet Take 0.5 mg by mouth 2 (two) times daily as needed for anxiety.   Yes [provider]  empagliflozin (JARDIANCE) 25 MG TABS tablet Take 1 tablet (25 mg total) by mouth daily. 06/09/22  Yes Gollan, Kathlene November, MD  gabapentin (NEURONTIN) 600 MG tablet Take 1.5 mg by mouth 4 (four) times daily. 11/13/20  Yes [provider]  HYDROcodone-acetaminophen (NORCO/VICODIN) 5-325 MG tablet Take  1 tablet by mouth every 6 (six) hours as needed.   Yes [provider]  ibuprofen (ADVIL) 800 MG tablet Take 800 mg by mouth every 6 (six) hours as needed.   Yes [provider]  lisinopril (ZESTRIL) 10 MG tablet Take 10 mg by mouth daily.   Yes [provider]  metoprolol succinate (TOPROL-XL) 50 MG 24 hr tablet Take 1 tablet (50 mg total) by mouth daily. 06/09/22  Yes Minna Merritts, MD  nortriptyline (PAMELOR) 25 MG capsule Take 25 mg by mouth at bedtime. 06/06/22  Yes [provider]  QUEtiapine (SEROQUEL) 200 MG tablet Take 1 tablet (200 mg total) by mouth at bedtime. 09/06/20  Yes Fritzi Mandes, MD  ranolazine (RANEXA) 1000 MG SR tablet Take 1 tablet (1,000 mg total) by mouth 2 (two) times daily. 06/09/22  Yes Minna Merritts, MD  ticagrelor (BRILINTA) 60 MG TABS tablet Take 1 tablet (60 mg total) by mouth 2 (two) times daily. Please call [phone] to schedule an appointment for further refills. Thank you. 06/09/22  Yes Gollan, Kathlene November, MD  tiZANidine (ZANAFLEX) 2 MG tablet Take 2 mg by mouth every 8 (eight) hours as needed. 01/15/22  Yes [provider]  TRULICITY [id number] SOPN Inject 0.75 mg into the skin once a week. 06/03/22  Yes [provider]  albuterol (PROVENTIL HFA;VENTOLIN HFA) 108 (90 Base) MCG/ACT inhaler Inhale 2 puffs into the lungs every 4 (four) hours as needed for wheezing or shortness of breath.    [provider]  baclofen (LIORESAL) 10 MG tablet Take 10 mg by mouth 3 (three) times daily as needed for muscle spasms. Patient not taking: Reported on 06/09/2022    [provider]  blood glucose meter kit and supplies KIT Dispense based on patient and insurance preference. Use up to four times daily as directed. (FOR ICD-9 250.00, 250.01). Patient not taking: Reported on 06/09/2022 04/11/20   Nicole Kindred A, DO  Blood Glucose Monitoring Suppl W/DEVICE KIT Test 1-2 times daily; E11.65 diagnosis Patient  not taking: Reported on 06/09/2022 03/02/15   Rubbie Battiest, RN  diclofenac Sodium (VOLTAREN ARTHRITIS PAIN) 1 % GEL Apply 4 g topically 4 (  four) times daily. 09/12/21   Teodoro Spray, PA  doxycycline (MONODOX) 100 MG capsule daily. Patient not taking: Reported on 06/09/2022    [provider]  furosemide (LASIX) 20 MG tablet TAKE 1 TABLET BY MOUTH EVERY DAY AS NEEDED FOR FLUID RETENTION OR SWELLING Patient not taking: Reported on 07/11/2022 04/02/22   Wellington Hampshire, MD  Insulin Glargine (BASAGLAR KWIKPEN) 100 UNIT/ML Inject 40 Units into the skin daily. Patient not taking: Reported on 06/09/2022 09/06/20   Fritzi Mandes, MD  Insulin Pen Needle (UNIFINE PENTIPS) 31G X 5 MM MISC use with basaglar Patient not taking: Reported on 06/09/2022 09/06/20   Fritzi Mandes, MD  lidocaine (LIDODERM) 5 % Place 1 patch onto the skin every 12 (twelve) hours. Remove & Discard patch within 12 hours or as directed by MD Patient not taking: Reported on 06/09/2022 09/12/21 09/12/22  Teodoro Spray, PA  nitroGLYCERIN (NITROSTAT) 0.4 MG SL tablet Place 1 tablet (0.4 mg total) under the tongue every 5 (five) minutes as needed for chest pain. 09/06/20   Fritzi Mandes, MD  pantoprazole (PROTONIX) 40 MG tablet Take 40 mg by mouth daily. Patient not taking: Reported on 07/11/2022 10/28/20   [provider]  permethrin (ELIMITE) 5 % cream Apply 1 Application topically once. Patient not taking: Reported on 07/11/2022 10/30/21   [provider]  traMADol (ULTRAM) 50 MG tablet Take 50 mg by mouth 3 (three) times daily as needed. Patient not taking: Reported on 06/09/2022 10/30/21   [provider]      VITAL SIGNS:  Blood pressure (!) 156/103, pulse 83, temperature 98.2 F (36.8 C), temperature source Oral, resp. rate 16, height 5\' 2"  (1.575 m), weight 57.5 kg, SpO2 98 %.  PHYSICAL EXAMINATION:  Physical Exam  GENERAL:  62 y.o.-year-old patient lying in the bed with no acute distress.   EYES: Pupils equal, round, reactive to light and accommodation. No scleral icterus. Extraocular muscles intact.  HEENT: Head atraumatic, normocephalic. Oropharynx and nasopharynx clear.  NECK:  Supple, no jugular venous distention. No thyroid enlargement, no tenderness.  LUNGS: Normal breath sounds bilaterally, no wheezing, rales,rhonchi or crepitation. No use of accessory muscles of respiration.  CARDIOVASCULAR: Regular rate and rhythm, S1, S2 normal. No murmurs, rubs, or gallops.  ABDOMEN: Soft, nondistended, nontender. Bowel sounds present. No organomegaly or mass.  EXTREMITIES: No pedal edema, cyanosis, or clubbing.  NEUROLOGIC: Cranial nerves II through XII are intact. Muscle strength 5/5 in all extremities. Sensation intact. Gait not checked.  PSYCHIATRIC: The patient is alert and oriented x 3.  Normal affect and good eye contact. SKIN: No obvious rash, lesion, or ulcer.   LABORATORY PANEL:   CBC Recent Labs  Lab 07/11/22 2008  WBC 17.0*  HGB 14.4  HCT 43.6  PLT 333   ------------------------------------------------------------------------------------------------------------------  Chemistries  Recent Labs  Lab 07/11/22 2008  NA 140  K 3.8  CL 103  CO2 26  GLUCOSE 254*  BUN 19  CREATININE 0.57  CALCIUM 9.6   ------------------------------------------------------------------------------------------------------------------  Cardiac Enzymes No results for input(s): "TROPONINI" in the last 168 hours. ------------------------------------------------------------------------------------------------------------------  RADIOLOGY:  DG Chest 2 View  Result Date: 07/11/2022 CLINICAL DATA:  CP EXAM: CHEST - 2 VIEW COMPARISON:  09/04/2020 FINDINGS: Lungs are clear. Heart size and mediastinal contours are within normal limits. Coronary stent. No effusion.  No pneumothorax. Right shoulder DJD with orthopedic anchor. Vertebral endplate spurring at multiple levels in the mid  thoracic spine. IMPRESSION: No acute cardiopulmonary disease. Electronically Signed  By: Lucrezia Europe M.D.   On: 07/11/2022 20:12      IMPRESSION AND PLAN:  Assessment and Plan: No notes have been filed under this hospital service. Service: Hospitalist      DVT prophylaxis: Lovenox***  Advanced Care Planning:  Code Status: full code***  Family Communication:  The plan of care was discussed in details with the patient (and family). I answered all questions. The patient agreed to proceed with the above mentioned plan. Further management will depend upon hospital course. Disposition Plan: Back to previous home environment Consults called: none***  All the records are reviewed and case discussed with ED provider.  Status is: Inpatient {Inpatient:23812}   At the time of the admission, it appears that the appropriate admission status for this patient is inpatient.  This is judged to be reasonable and necessary in order to provide the required intensity of service to ensure the patient's safety given the presenting symptoms, physical exam findings and initial radiographic and laboratory data in the context of comorbid conditions.  The patient requires inpatient status due to high intensity of service, high risk of further deterioration and high frequency of surveillance required.  I certify that at the time of admission, it is my clinical judgment that the patient will require inpatient hospital care extending more than 2 midnights.                            Dispo: The patient is from: Home              Anticipated d/c is to: Home              Patient currently is not medically stable to d/c.              Difficult to place patient: No  Christel Mormon M.D on 07/11/2022 at 11:02 PM  [facility]   From 7 PM-7 AM, contact night-coverage [email or web address]  CC: Primary care physician; Pcp, No

## 2022-07-11 NOTE — Progress Notes (Signed)
[place] for heparin infusion Indication: ACS/STEMI  Allergies  Allergen Reactions   Buprenorphine Hcl     Nausea and vomiting   Carbamazepine Anaphylaxis and Other (See Comments)    Blood dyscrasia when on with Lithium   Lithium Other (See Comments)    Blood dyscrasia   Lodine [Etodolac] Shortness Of Breath, Diarrhea, Nausea And Vomiting and Rash   Methocarbamol Other (See Comments) and Shortness Of Breath    Unknown  Rash, tachypnea  Rash, tachypnea    Unknown Rash, tachypnea   Acetaminophen Other (See Comments)   Codeine Other (See Comments)    Upset stomach   Levofloxacin In D5w Swelling and Other (See Comments)    "Salty" sensation in mouth. "Hard time to explain it"   Other Other (See Comments)   Oxycodone-Acetaminophen Other (See Comments)   Tape Other (See Comments)    Welps, blister on skin   Wound Dressing Adhesive    Relafen [Nabumetone] Diarrhea, Nausea And Vomiting and Rash    Patient Measurements: Height: 5\' 2"  (157.5 cm) Weight: 57.5 kg (126 lb 12.2 oz) IBW/kg (Calculated) : 50.1 Heparin Dosing Weight: 57.5 kg  Vital Signs: Temp: 98.2 F (36.8 C) (03/22 2001) Temp Source: Oral (03/22 2001) BP: 156/103 (03/22 2200) Pulse Rate: 83 (03/22 2200)  Labs: Recent Labs    07/11/22 2008 07/11/22 2204  HGB 14.4  --   HCT 43.6  --   PLT 333  --   CREATININE 0.57  --   TROPONINIHS [id number]* 12,616*    Estimated Creatinine Clearance: 59.9 mL/min (by C-G formula based on SCr of 0.57 mg/dL).   Medical History: Past Medical History:  Diagnosis Date   Anxiety    Arthritis    "qwhere" (06/14/2014)   Bipolar disorder ([place])    CAD (coronary artery disease)    a. 2008 DES->RCA; b. 2012 PCI: 52mRCA s/p DES; b. 05/2014 DES-> 90 dRCA; c. 11/16 PCI: dRCA ISR->DES;  d. 4/18 PCI: p-mRCA 95% s/p PCI/DES; e. 02/2017 Cath: patent dRCA stent, RPDA 90ost->Med Rx; f. 05/2017 PTCA mRCA 2/2 ISR; g. 05/2019 Cath: stable dzs; f. 08/2020  PCI: LM nl, LAD nl, D1 40, LCX 60p/m, OM1 50, RCA 20p/m, 121m/d (3.0x18 & 3.0x15 Resolute Onyx DESs), PDA 80, RPDA 80, RPAV 100. EF 50-55.   Chronic bronchitis ([place])    "get it q yr"   Daily headache    "when my blood pressure is up" (06/14/2014)   Depression    Diastolic dysfunction    a. 02/2017 Echo: EF 55-60%, no rwma, Gr1 DD, nl RV fxn.   Diastolic dysfunction    a. 08/2020 Echo: EF 55-60%, mod LVH, grI DD. NL RV fxn.   GERD (gastroesophageal reflux disease)    Heart murmur    High cholesterol    History of stomach ulcers    Hypertension    Insomnia    Kidney stones    Migraine    "haven't had them in a good while; did get them 1-2 times/yr" (06/14/2014)   Myocardial infarction ([place]) 2008; ~ 2012   Pneumonia    "get it 1-2 times/year" (06/14/2014)   RLS (restless legs syndrome)    Seizures ([place])    Sleep apnea    "they want me to do the lab but I haven't" (06/14/2014)   Stroke [facility])    "they say I've had several" (06/14/2014)   Tobacco abuse    Type II diabetes mellitus (HCC)    Urinary, incontinence, stress female  Assessment: Pt is a 62 yo female presenting to ED c/o mid-sternal CP, radiating to neck & jaw, found with elevated Troponin I.  Goal of Therapy:  Heparin level 0.3-0.7 units/ml Monitor platelets by anticoagulation protocol: Yes   Plan:  Bolus 3500 units x 1 Start heparin infusion at 750 units/hr Will check HL in 6 hr after start of infusion CBC daily while on heparin  Renda Rolls, PharmD, [facility] 07/11/2022 10:42 PM

## 2022-07-12 ENCOUNTER — Inpatient Hospital Stay (HOSPITAL_COMMUNITY)
Admit: 2022-07-12 | Discharge: 2022-07-12 | Disposition: A | Discharge status: Home / Self Care | Payer: 59 | Attending: Family Medicine | Admitting: Family Medicine

## 2022-07-12 ENCOUNTER — Encounter: Payer: Self-pay | Admitting: Family Medicine

## 2022-07-12 ENCOUNTER — Other Ambulatory Visit: Payer: Self-pay

## 2022-07-12 DIAGNOSIS — K219 Gastro-esophageal reflux disease without esophagitis: Secondary | ICD-10-CM

## 2022-07-12 DIAGNOSIS — F32A Anxiety disorder, unspecified: Secondary | ICD-10-CM

## 2022-07-12 DIAGNOSIS — I214 Non-ST elevation (NSTEMI) myocardial infarction: Secondary | ICD-10-CM

## 2022-07-12 DIAGNOSIS — E119 Type 2 diabetes mellitus without complications: Secondary | ICD-10-CM

## 2022-07-12 DIAGNOSIS — F419 Anxiety disorder, unspecified: Secondary | ICD-10-CM

## 2022-07-12 LAB — HEPARIN LEVEL (UNFRACTIONATED)
Heparin Unfractionated: 0.16 IU/mL — ABNORMAL LOW (ref 0.30–0.70)
Heparin Unfractionated: >1.1 IU/mL — ABNORMAL HIGH (ref 0.30–0.70)
Heparin Unfractionated: <0.1 IU/mL — ABNORMAL LOW (ref 0.30–0.70)

## 2022-07-12 LAB — LIPID PANEL
Cholesterol: 105 mg/dL (ref 0–200)
HDL: 47 mg/dL (ref >40)
LDL Cholesterol: 39 mg/dL (ref 0–99)
Total CHOL/HDL Ratio: 2.2 RATIO
Triglycerides: 94 mg/dL (ref <150)
VLDL: 19 mg/dL (ref 0–40)

## 2022-07-12 LAB — URINE DRUG SCREEN, QUALITATIVE (ARMC ONLY)
Amphetamines, Ur Screen: POSITIVE — AB
Barbiturates, Ur Screen: NOT DETECTED
Benzodiazepine, Ur Scrn: NOT DETECTED
Cannabinoid 50 Ng, Ur ~~LOC~~: POSITIVE — AB
Cocaine Metabolite,Ur ~~LOC~~: NOT DETECTED
MDMA (Ecstasy)Ur Screen: NOT DETECTED
Methadone Scn, Ur: NOT DETECTED
Opiate, Ur Screen: POSITIVE — AB
Phencyclidine (PCP) Ur S: NOT DETECTED
Tricyclic, Ur Screen: POSITIVE — AB

## 2022-07-12 LAB — CBC
HCT: 38.1 % (ref 36.0–46.0)
Hemoglobin: 12.4 g/dL (ref 12.0–15.0)
MCH: 31 pg (ref 26.0–34.0)
MCHC: 32.5 g/dL (ref 30.0–36.0)
MCV: 95.3 fL (ref 80.0–100.0)
Platelets: 264 10*3/uL (ref 150–400)
RBC: 4 MIL/uL (ref 3.87–5.11)
RDW: 12.7 % (ref 11.5–15.5)
WBC: 10.2 10*3/uL (ref 4.0–10.5)
nRBC: 0 % (ref 0.0–0.2)

## 2022-07-12 LAB — PROTIME-INR
INR: 1 (ref 0.8–1.2)
Prothrombin Time: 13.3 seconds (ref 11.4–15.2)

## 2022-07-12 LAB — ECHOCARDIOGRAM COMPLETE
AR max vel: 1.97 cm2
AV Area VTI: 2.16 cm2
AV Area mean vel: 1.86 cm2
AV Mean grad: 4.5 mmHg
AV Peak grad: 7.8 mmHg
Ao pk vel: 1.4 m/s
Area-P 1/2: 3.91 cm2
Calc EF: 53.6 %
Height: 62 in
MV M vel: 5.92 m/s
MV Peak grad: 140.2 mmHg
MV VTI: 2.08 cm2
P 1/2 time: 383 msec
Radius: 0.6 cm
S' Lateral: 4.2 cm
Single Plane A2C EF: 55.3 %
Single Plane A4C EF: 52.7 %
Weight: 2028.23 oz

## 2022-07-12 LAB — BASIC METABOLIC PANEL
Anion gap: 5 (ref 5–15)
BUN: 14 mg/dL (ref 6–20)
CO2: 25 mmol/L (ref 22–32)
Calcium: 8.4 mg/dL — ABNORMAL LOW (ref 8.9–10.3)
Chloride: 109 mmol/L (ref 98–111)
Creatinine, Ser: 0.62 mg/dL (ref 0.44–1.00)
GFR, Estimated: >60 mL/min (ref >60)
Glucose, Bld: 167 mg/dL — ABNORMAL HIGH (ref 70–99)
Potassium: 3.6 mmol/L (ref 3.5–5.1)
Sodium: 139 mmol/L (ref 135–145)

## 2022-07-12 LAB — TROPONIN I (HIGH SENSITIVITY)
Troponin I (High Sensitivity): 22293 ng/L (ref <18)
Troponin I (High Sensitivity): 17514 ng/L (ref <18)

## 2022-07-12 LAB — HIV ANTIBODY (ROUTINE TESTING W REFLEX): HIV Screen 4th Generation wRfx: NONREACTIVE

## 2022-07-12 MED ORDER — HEPARIN BOLUS VIA INFUSION
2000.0000 [IU] | Freq: Once | INTRAVENOUS | Status: AC
  Administered 2022-07-12: 2000 [IU] via INTRAVENOUS
  Filled 2022-07-12: qty 2000

## 2022-07-12 MED ORDER — NITROGLYCERIN IN D5W 200-5 MCG/ML-% IV SOLN
0.0000 ug/min | INTRAVENOUS | Status: DC
  Administered 2022-07-12: 5 ug/min via INTRAVENOUS
  Filled 2022-07-12: qty 250

## 2022-07-12 MED ORDER — HEPARIN BOLUS VIA INFUSION
1700.0000 [IU] | Freq: Once | INTRAVENOUS | Status: AC
  Administered 2022-07-12: 1700 [IU] via INTRAVENOUS
  Filled 2022-07-12: qty 1700

## 2022-07-12 NOTE — Progress Notes (Signed)
[place] for heparin infusion Indication: ACS/STEMI  Allergies  Allergen Reactions   Buprenorphine Hcl     Nausea and vomiting   Carbamazepine Anaphylaxis and Other (See Comments)    Blood dyscrasia when on with Lithium   Lithium Other (See Comments)    Blood dyscrasia   Lodine [Etodolac] Shortness Of Breath, Diarrhea, Nausea And Vomiting and Rash   Methocarbamol Other (See Comments) and Shortness Of Breath    Unknown  Rash, tachypnea  Rash, tachypnea    Unknown Rash, tachypnea   Trazodone Other (See Comments)   Levofloxacin In D5w Swelling and Other (See Comments)    "Salty" sensation in mouth. "Hard time to explain it"   Tape Other (See Comments)    Welps, blister on skin   Wound Dressing Adhesive    Relafen [Nabumetone] Diarrhea, Nausea And Vomiting and Rash    Patient Measurements: Height: 5\' 2"  (157.5 cm) Weight: 57.5 kg (126 lb 12.2 oz) IBW/kg (Calculated) : 50.1 Heparin Dosing Weight: 57.5 kg  Vital Signs: Temp: 98.2 F (36.8 C) (03/22 2001) Temp Source: Oral (03/22 2001) BP: 145/96 (03/23 0530) Pulse Rate: 86 (03/23 0530)  Labs: Recent Labs    07/11/22 2008 07/11/22 2204 07/11/22 2240 07/12/22 0509  HGB 14.4  --   --  12.4  HCT 43.6  --   --  38.1  PLT 333  --   --  264  APTT  --   --  27  --   LABPROT  --   --  12.7 13.3  INR  --   --  1.0 1.0  HEPARINUNFRC  --   --   --  0.16*  CREATININE 0.57  --   --   --   [id number]*  --   --      Estimated Creatinine Clearance: 59.9 mL/min (by C-G formula based on SCr of 0.57 mg/dL).   Medical History: Past Medical History:  Diagnosis Date   Anxiety    Arthritis    "qwhere" (06/14/2014)   Bipolar disorder ([place])    CAD (coronary artery disease)    a. 2008 DES->RCA; b. 2012 PCI: 63mRCA s/p DES; b. 05/2014 DES-> 90 dRCA; c. 11/16 PCI: dRCA ISR->DES;  d. 4/18 PCI: p-mRCA 95% s/p PCI/DES; e. 02/2017 Cath: patent dRCA stent, RPDA 90ost->Med Rx; f.  05/2017 PTCA mRCA 2/2 ISR; g. 05/2019 Cath: stable dzs; f. 08/2020 PCI: LM nl, LAD nl, D1 40, LCX 60p/m, OM1 50, RCA 20p/m, 179m/d (3.0x18 & 3.0x15 Resolute Onyx DESs), PDA 80, RPDA 80, RPAV 100. EF 50-55.   Chronic bronchitis ([place])    "get it q yr"   Daily headache    "when my blood pressure is up" (06/14/2014)   Depression    Diastolic dysfunction    a. 02/2017 Echo: EF 55-60%, no rwma, Gr1 DD, nl RV fxn.   Diastolic dysfunction    a. 08/2020 Echo: EF 55-60%, mod LVH, grI DD. NL RV fxn.   GERD (gastroesophageal reflux disease)    Heart murmur    High cholesterol    History of stomach ulcers    Hypertension    Insomnia    Kidney stones    Migraine    "haven't had them in a good while; did get them 1-2 times/yr" (06/14/2014)   Myocardial infarction ([place]) 2008; ~ 2012   Pneumonia    "get it 1-2 times/year" (06/14/2014)   RLS (restless legs syndrome)    Seizures ([place])  Sleep apnea    "they want me to do the lab but I haven't" (06/14/2014)   Stroke [facility])    "they say I've had several" (06/14/2014)   Tobacco abuse    Type II diabetes mellitus (HCC)    Urinary, incontinence, stress female     Assessment: Pt is a 61 yo female presenting to ED c/o mid-sternal CP, radiating to neck & jaw, found with elevated Troponin I.  Goal of Therapy:  Heparin level 0.3-0.7 units/ml Monitor platelets by anticoagulation protocol: Yes   03/23 0509 HL 0.16, subtherapeutic  Plan:  Bolus 1700 units x 1 Increase heparin infusion to 900 units/hr Will check HL in 6 hr after rate change CBC daily while on heparin  Renda Rolls, PharmD, [facility] 07/12/2022 6:12 AM

## 2022-07-12 NOTE — ED Notes (Signed)
Dr Posey Pronto at bedside talking with the patient.

## 2022-07-12 NOTE — Assessment & Plan Note (Signed)
-   We will continue PPI therapy 

## 2022-07-12 NOTE — Assessment & Plan Note (Signed)
-   We will continue her antihypertensives. 

## 2022-07-12 NOTE — ED Notes (Signed)
New breakfast tray delivered to patient

## 2022-07-12 NOTE — Hospital Course (Signed)
[id number]  [id number]  [id number] 

## 2022-07-12 NOTE — ED Notes (Signed)
Patient yelled out and disconnected herself from the cardiac monitor. RN checked on patient. Patient yelled at RN stating that her food does taste good and she is being starved to death. Patient yelled that she is being completely neglected by waiting in the ER and made to wait for a cardiac cath. Patient threatening to leave walk out right now and go home. Patient informed that she has that right however she should stay in the ER to continue to be monitored. Patient yelled, "what for? No one is doing anything to take care of me." Patient allowed RN to place her back on the cardiac monitor. Attending notified.

## 2022-07-12 NOTE — Consult Note (Signed)
Cardiology Consult    Patient ID: Jennifer [name] MRN: [id number], DOB/AGE: Oct 25, 1960   Admit date: 07/11/2022 Date of Consult: 07/12/2022  Primary Physician: Merryl Hacker, No Primary Cardiologist: [name], MD Requesting Provider: Chauncey Cruel. Posey Pronto, MD  Patient Profile    Jennifer [name] is a 62 y.o. female with a history of CAD status post multiple right coronary artery interventions, hypertension, hyperlipidemia, depression, anxiety, bipolar disorder, diabetes, tobacco abuse, and PAD, who is being seen today for the evaluation of chest pain w/ ST elevation at the request of Dr. Posey Pronto.  Past Medical History   Past Medical History:  Diagnosis Date   Anxiety    Arthritis    "qwhere" (06/14/2014)   Bipolar disorder ([place])    CAD (coronary artery disease)    a. 2008 DES->RCA; b. 2012 PCI: 42mRCA s/p DES; b. 05/2014 DES-> 90 dRCA; c. 11/16 PCI: dRCA ISR->DES;  d. 4/18 PCI: p-mRCA 95% s/p PCI/DES; e. 02/2017 Cath: patent dRCA stent, RPDA 90ost->Med Rx; f. 05/2017 PTCA mRCA 2/2 ISR; g. 05/2019 Cath: stable dzs; f. 08/2020 PCI: LM nl, LAD nl, D1 40, LCX 60p/m, OM1 50, RCA 20p/m, 153m/d (3.0x18 & 3.0x15 Resolute Onyx DESs), PDA 80, RPDA 80, RPAV 100. EF 50-55.   Chronic bronchitis ([place])    "get it q yr"   Claudication in peripheral vascular disease ([place])    Daily headache    "when my blood pressure is up" (06/14/2014)   Depression    Diastolic dysfunction    a. 02/2017 Echo: EF 55-60%, no rwma, Gr1 DD, nl RV fxn.   Diastolic dysfunction    a. 08/2020 Echo: EF 55-60%, mod LVH, grI DD. NL RV fxn.   GERD (gastroesophageal reflux disease)    Heart murmur    High cholesterol    History of stomach ulcers    Hypertension    Insomnia    Kidney stones    Migraine    "haven't had them in a good while; did get them 1-2 times/yr" (06/14/2014)   Myocardial infarction ([place]) 2008; ~ 2012   PAD (peripheral artery disease) ([place])    a. 01/2021 LE duplex: RCFA 30-49, R AT 30-49, LCFA 50-74, L Pop 50-74, L AT 50-74,  L Peroneal 100.   Pneumonia    "get it 1-2 times/year" (06/14/2014)   RLS (restless legs syndrome)    Seizures (HCC)    Sleep apnea    "they want me to do the lab but I haven't" (06/14/2014)   Stroke [facility])    "they say I've had several" (06/14/2014)   Tobacco abuse    Type II diabetes mellitus (HCC)    Urinary, incontinence, stress female     Past Surgical History:  Procedure Laterality Date   ABDOMINAL HYSTERECTOMY  1984   "I've got 1 ovary left"   CARDIAC CATHETERIZATION N/A 02/26/2015   Procedure: Left Heart Cath and Coronary Angiography;  Surgeon: Wellington Hampshire, MD;  Location: [place] [facility];  Service: Cardiovascular;  Laterality: N/A;   CARDIAC CATHETERIZATION N/A 02/26/2015   Procedure: Coronary Stent Intervention;  Surgeon: Wellington Hampshire, MD;  Location: [place] [facility];  Service: Cardiovascular;  Laterality: N/A;   CESAREAN SECTION  1984   CORONARY ANGIOPLASTY WITH STENT PLACEMENT  2008; ~ 2012   "1; 1"   CORONARY BALLOON ANGIOPLASTY N/A 06/09/2017   Procedure: CORONARY BALLOON ANGIOPLASTY;  Surgeon: Nelva Bush, MD;  Location: [place] [facility];  Service: Cardiovascular;  Laterality: N/A;   CORONARY STENT INTERVENTION  N/A 08/04/2016   Procedure: Coronary Stent Intervention;  Surgeon: Wellington Hampshire, MD;  Location: [place] [facility];  Service: Cardiovascular;  Laterality: N/A;   CORONARY/GRAFT ACUTE MI REVASCULARIZATION N/A 09/05/2020   Procedure: Coronary/Graft Acute MI Revascularization;  Surgeon: Nelva Bush, MD;  Location: [place] [facility];  Service: Cardiovascular;  Laterality: N/A;   DILATION AND CURETTAGE OF UTERUS     EXTRACORPOREAL SHOCK WAVE LITHOTRIPSY  X 2   I & D EXTREMITY Left 02/09/2018   Procedure: IRRIGATION AND DEBRIDEMENT EXTREMITY;  Surgeon: Herbert Pun, MD;  Location: [facility];  Service: General;  Laterality: Left;   KNEE ARTHROSCOPY Left    LEFT HEART CATH Bilateral 08/04/2016   Procedure: Left Heart Cath poss  PCI;  Surgeon: Wellington Hampshire, MD;  Location: [place] [facility];  Service: Cardiovascular;  Laterality: Bilateral;   LEFT HEART CATH AND CORONARY ANGIOGRAPHY Left 02/19/2017   Procedure: LEFT HEART CATH AND CORONARY ANGIOGRAPHY;  Surgeon: Minna Merritts, MD;  Location: [place] [facility];  Service: Cardiovascular;  Laterality: Left;   LEFT HEART CATH AND CORONARY ANGIOGRAPHY Left 06/09/2017   Procedure: LEFT HEART CATH AND CORONARY ANGIOGRAPHY;  Surgeon: Nelva Bush, MD;  Location: [place] [facility];  Service: Cardiovascular;  Laterality: Left;   LEFT HEART CATH AND CORONARY ANGIOGRAPHY N/A 04/10/2020   Procedure: LEFT HEART CATH AND CORONARY ANGIOGRAPHY;  Surgeon: Wellington Hampshire, MD;  Location: [place] [facility];  Service: Cardiovascular;  Laterality: N/A;   LEFT HEART CATH AND CORONARY ANGIOGRAPHY N/A 09/05/2020   Procedure: LEFT HEART CATH AND CORONARY ANGIOGRAPHY;  Surgeon: Nelva Bush, MD;  Location: [place] [facility];  Service: Cardiovascular;  Laterality: N/A;   LEFT HEART CATHETERIZATION WITH CORONARY ANGIOGRAM N/A 06/15/2014   Procedure: LEFT HEART CATHETERIZATION WITH CORONARY ANGIOGRAM;  Surgeon: Jettie Booze, MD;  Location: [facility] C[facility] LAB;  Service: Cardiovascular;  Laterality: N/A;   PERCUTANEOUS CORONARY STENT INTERVENTION (PCI-S)  06/15/2014   Procedure: PERCUTANEOUS CORONARY STENT INTERVENTION (PCI-S);  Surgeon: Jettie Booze, MD;  Location: [facility] C[facility] LAB;  Service: Cardiovascular;;   TONSILLECTOMY     TUBAL LIGATION       Allergies  Allergies  Allergen Reactions   Buprenorphine Hcl     Nausea and vomiting   Carbamazepine Anaphylaxis and Other (See Comments)    Blood dyscrasia when on with Lithium   Lithium Other (See Comments)    Blood dyscrasia   Lodine [Etodolac] Shortness Of Breath, Diarrhea, Nausea And Vomiting and Rash   Methocarbamol Other (See Comments) and Shortness Of Breath    Unknown  Rash, tachypnea  Rash, tachypnea     Unknown Rash, tachypnea   Trazodone Other (See Comments)   Levofloxacin In D5w Swelling and Other (See Comments)    "Salty" sensation in mouth. "Hard time to explain it"   Tape Other (See Comments)    Welps, blister on skin   Wound Dressing Adhesive    Relafen [Nabumetone] Diarrhea, Nausea And Vomiting and Rash    History of Present Illness    62 y/o ? with the above complex past medical history including CAD status post multiple PCI's and drug-eluting stents to the right coronary artery in the setting of recurrent in-stent restenosis dating back to 2008. Other history includes hypertension, hyperlipidemia, diabetes, obesity, tobacco abuse, depression, anxiety, PAD, and bipolar disorder. Most recent echo in May 2022 showed an EF of 55 to 60% with moderate LVH and grade 1 diastolic dysfunction. Diagnostic catheterization performed  in the setting of recurrent chest pain in May 2022, showed an occluded distal RCA with thrombus into the small RPL branches. Otherwise, she had nonobstructive disease which was similar to catheterization in December 2021. EF was 50 to 55%. The mid to distal RCA was treated with 2 overlapping drug-eluting stents, with recommendation for prolonged dual antiplatelet therapy.  LE arterial duplex in 01/2021 showed moderate bilat LE PAD, which has been medically managed.  She was last seen in cardiology clinic in 05/2022, at which time she was stable from a cardiac standpoint.  Ms. Waldmann was in her USOH until the evening of March 21.  At approximately 9 PM, she was smoking a "condensed version of weed," which she does a few times per month.  She notes that within a few minutes of smoking, she immediately developed substernal chest pain associated with dyspnea.  She stopped smoking and took 3 sublingual nitroglycerin tablets with no relief.  Chest pain and dyspnea persisted throughout the night and radiated into her neck.  She notes that she slept very little.  She falling asleep  around 6 AM and then woke up at noon yesterday, March 22.  She took her medicines but immediately felt very nauseated and vomited multiple times between 12 PM and 6 PM.  She was not able to keep down any of her medications.  After her husband returned from work, she presented to the  emergency department.  Here, she was hypertensive on arrival @ 177/112.  ECG w/ new, subtle inferior and anterolateral ST elevation w/ assoc TWI and inferior Qs.  Labs notable for HsTrop of 9887  12616  22293, glub 254, WBC 17.0.  CXR w/ acute cardiopulm dzs.  She is currently on heparin and continues to complain of 5/10 chest pain.  She notes that with any exertion (repositioning in bed or getting up and going to the bathroom), symptoms worsen.  Inpatient Medications     aspirin EC  81 mg Oral Daily   atorvastatin  80 mg Oral Daily   empagliflozin  25 mg Oral Daily   gabapentin  900 mg Oral TID WC & HS   heparin       lisinopril  10 mg Oral Daily   metoprolol succinate  50 mg Oral Daily   QUEtiapine  200 mg Oral QHS   ranolazine  1,000 mg Oral BID   ticagrelor  60 mg Oral BID    Family History    Family History  Problem Relation Age of Onset   Lung cancer Mother    Cirrhosis Father    She indicated that her mother is deceased. She indicated that her father is deceased.   Social History    Social History   Socioeconomic History   Marital status: Legally Separated    Spouse name: Not on file   Number of children: Not on file   Years of education: Not on file   Highest education level: Not on file  Occupational History   Not on file  Tobacco Use   Smoking status: Every Day    Packs/day: 1.00    Years: 35.00    Additional pack years: 0.00    Total pack years: 35.00    Types: Cigarettes   Smokeless tobacco: Never  Vaping Use   Vaping Use: Never used  Substance and Sexual Activity   Alcohol use: Yes    Alcohol/week: 0.0 standard drinks of alcohol    Comment: occ   Drug use: Yes  Types:  Marijuana    Comment: used about 3 days ago   Sexual activity: Not on file  Other Topics Concern   Not on file  Social History Narrative   Not on file   Social Determinants of Health   Financial Resource Strain: Not on file  Food Insecurity: Not on file  Transportation Needs: Not on file  Physical Activity: Not on file  Stress: Not on file  Social Connections: Not on file  Intimate Partner Violence: Not on file     Review of Systems    General:  No chills, fever, night sweats or weight changes.  Cardiovascular:  +++ chest pain, +++ dyspnea, no edema, orthopnea, palpitations, paroxysmal nocturnal dyspnea. Dermatological: No rash, lesions/masses Respiratory: No cough, +++ dyspnea Urologic: No hematuria, dysuria Abdominal:   +++ nausea, +++ vomiting, no diarrhea, bright red blood per rectum, melena, or hematemesis Neurologic:  No visual changes, wkns, changes in mental status. MSK: Chronic bilateral lower extremity pain for which she uses marijuana. All other systems reviewed and are otherwise negative except as noted above.  Physical Exam    Blood pressure 109/75, pulse 80, temperature 98.2 F (36.8 C), temperature source Oral, resp. rate 15, height 5\' 2"  (1.575 m), weight 57.5 kg, SpO2 92 %.  General: Pleasant, NAD Psych: Normal affect. Neuro: Alert and oriented X 3. Moves all extremities spontaneously. HEENT: Normal  Neck: Supple without bruits or JVD. Lungs:  Resp regular and unlabored, CTA. Heart: RRR, 2/6 systolic murmur at the upper sternal borders.  No S3, S4, or rubs.   Abdomen: Soft, non-tender, non-distended, BS + x 4.  Extremities: No clubbing, cyanosis or edema.  Diminished distal lower extremity pulses bilaterally, Radials 2+ and equal bilaterally.  Labs    Cardiac Enzymes Recent Labs  Lab 07/11/22 2008 07/11/22 2204 07/12/22 0509  TROPONINIHS [id number]* 12,616* 22,293*      Lab Results  Component Value Date   WBC 10.2 07/12/2022   HGB 12.4 07/12/2022    HCT 38.1 07/12/2022   MCV 95.3 07/12/2022   PLT 264 07/12/2022    Recent Labs  Lab 07/12/22 0509  NA 139  K 3.6  CL 109  CO2 25  BUN 14  CREATININE 0.62  CALCIUM 8.4*  GLUCOSE 167*   Lab Results  Component Value Date   CHOL 105 07/12/2022   HDL 47 07/12/2022   LDLCALC 39 07/12/2022   TRIG 94 07/12/2022     Radiology Studies    DG Chest 2 View  Result Date: 07/11/2022 CLINICAL DATA:  CP EXAM: CHEST - 2 VIEW COMPARISON:  09/04/2020 FINDINGS: Lungs are clear. Heart size and mediastinal contours are within normal limits. Coronary stent. No effusion.  No pneumothorax. Right shoulder DJD with orthopedic anchor. Vertebral endplate spurring at multiple levels in the mid thoracic spine. IMPRESSION: No acute cardiopulmonary disease. Electronically Signed   By: Lucrezia Europe M.D.   On: 07/11/2022 20:12    ECG & Cardiac Imaging    RSR, 73, inferior infarct w/ subtle inferior and anterolateral ST elevation w/ associated TWI - new - personally reviewed.  Assessment & Plan    1.  Acute coronary syndrome/coronary artery disease: Patient with a history of CAD status post multiple right coronary artery interventions in the setting of recurrent in-stent restenosis.  She was last intervened upon in May 2002, at which time she had occlusive in-stent restenosis in the distal RCA as well as moderate circumflex, obtuse marginal, and diagonal disease, and more severe right-sided distal  branch vessel disease with left-to-right collaterals.  The distal RCA was successfully treated with drug-eluting stent.  She had since been doing well but in the setting of smoking a condensed form of marijuana on the evening of March 21, she developed severe chest pain radiating to her neck and associated with dyspnea.  This was persistent throughout the evening and became associate with nausea and vomiting in the afternoon of March 22, eventually prompting her to present to the emergency department.  Here, ECG notable for  subtle inferior and anterolateral ST segment coving with associated T wave inversion in inferior Q waves.  Troponin has increased to 22,293 as of 5:00 this morning.  She is currently on heparin and I will add IV nitroglycerin as she continues to complain of 5/10 chest pain.  I suspect she will require diagnostic catheterization today and will discuss with Dr. Rockey Situ and the interventional team.  Keep n.p.o.  Continue aspirin, statin, beta-blocker, and ranolazine.  2.  Essential HTN: Blood pressure elevated on arrival in setting of severe chest pain and acute coronary syndrome.  141/87 this morning.  Follow-up after medications and adjust as necessary.  3.  HL: LDL of 39.  Continue high potency statin therapy.  4.  DMII: On Lantus and Jardiance at home.  Per internal medicine.  5.  PAD: Moderate bilateral lower extremity PAD with left peroneal occlusion noted on duplex and 2022.  She has chronic leg pain.  She has been medically managed.  6.  Tob Abuse: Smoking to the 1 pack/day.  Complete cessation advised.  7.  Marijuana use: Uses 2-3 times per month and chest pain symptoms started in the setting of marijuana usage.  She does not know if this was laced with something.  Will check tox screen.  Risk Assessment/Risk Scores:     TIMI Risk Score for Unstable Angina or Non-ST Elevation MI:   The patient's TIMI risk score is 6, which indicates a 41% risk of all cause mortality, new or recurrent myocardial infarction or need for urgent revascularization in the next 14 days.      Signed, [name] Hodgkins, NP 07/12/2022, 8:39 AM  For questions or updates, please contact   Please consult [email or web address] for contact info under Cardiology/STEMI.

## 2022-07-12 NOTE — Progress Notes (Signed)
[name] [place] NAME: Jennifer [name]    MR#:  [id number]  DATE OF BIRTH:  1960-12-01  SUBJECTIVE:  patient came in with chest discomfort. She use some drugs including marijuana yesterday. Ruled in for ECS. Upset earlier because she was hungry and wanted to get cardiac cath and felt she was not being cared for. Discussed at length with her that her treatment is ongoing as per consultation with cardiology. Patient was later apologetic for being unreasonable with the RN earlier.    VITALS:  Blood pressure (!) 108/57, pulse 72, temperature 98.2 F (36.8 C), temperature source Oral, resp. rate 16, height 5\' 2"  (1.575 m), weight 57.5 kg, SpO2 96 %.  PHYSICAL EXAMINATION:   GENERAL:  62 y.o.-year-old patient with no acute distress.  LUNGS: Normal breath sounds bilaterally, no wheezing CARDIOVASCULAR: S1, S2 normal. No murmur   ABDOMEN: Soft, nontender, nondistended. Bowel sounds present.  EXTREMITIES: No  edema b/l.    NEUROLOGIC: nonfocal  patient is alert and awake SKIN: No obvious rash, lesion, or ulcer.   LABORATORY PANEL:  CBC Recent Labs  Lab 07/12/22 0509  WBC 10.2  HGB 12.4  HCT 38.1  PLT 264    Chemistries  Recent Labs  Lab 07/12/22 0509  NA 139  K 3.6  CL 109  CO2 25  GLUCOSE 167*  BUN 14  CREATININE 0.62  CALCIUM 8.4*    RADIOLOGY:  DG Chest 2 View  Result Date: 07/11/2022 CLINICAL DATA:  CP EXAM: CHEST - 2 VIEW COMPARISON:  09/04/2020 FINDINGS: Lungs are clear. Heart size and mediastinal contours are within normal limits. Coronary stent. No effusion.  No pneumothorax. Right shoulder DJD with orthopedic anchor. Vertebral endplate spurring at multiple levels in the mid thoracic spine. IMPRESSION: No acute cardiopulmonary disease. Electronically Signed   By: Jennifer [name] M.D.   On: 07/11/2022 20:12    Assessment and Plan  Jennifer [name] is a 62 y.o. Caucasian female with medical history significant for coronary  artery disease status post PCI and stents, chronic bronchitis, osteoarthritis, anxiety, bipolar disorder, dyslipidemia, hypertension, migraine, type 2 diabetes mellitus, seizure disorder and OSA, who presented to the ER with acute onset of 8/10 pressure-like midsternal chest pain which started last night when she was smoking a dab of marijuana with her friends.   NSTEMI (non-ST elevated myocardial infarction) ([place]) -  on IV heparin drip and titrate nitro gtt  -  on aspirin as well as as needed sublingual nitroglycerin and IV morphine sulfate for pain. - high-dose statin therapy, beta-blocker therapy. - 2D echo  -- [facility] cardiology consult noted--Cath Monday   Dyslipidemia - continue statin therapy and check fasting lipids in AM.   Essential hypertension - continue her antihypertensives.   GERD without esophagitis - continue PPI therapy.   Type 2 diabetes mellitus without complications ([place]) - on supplement coverage with NovoLog. - continue basal coverage. - continue Jardiance.   Anxiety and depression Bipolar DO -  continue his BuSpar, Klonopin prn and Lexapro.  Polysubstance abuse.  Patient advised cessation     DVT prophylaxis: IV heparin. Advanced Care Planning:  Code Status: full code.     Procedures: Family communication :none Consults :Cardiology CODE STATUS: full DVT Prophylaxis :heparin gtt Level of care: Progressive Status is: Inpatient Remains inpatient appropriate because: NSTEMI    TOTAL TIME TAKING CARE OF THIS PATIENT: 35 minutes.  >50% time spent on counselling and coordination of care  Note: This  dictation was prepared with Dragon dictation along with smaller phrase technology. Any transcriptional errors that result from this process are unintentional.  Jennifer [name] M.D    [facility]   CC: Primary care physician; Pcp, No

## 2022-07-12 NOTE — ED Notes (Signed)
Hassan Rowan, NP notified via secure chat of pt's increasing elevated troponin.

## 2022-07-12 NOTE — Assessment & Plan Note (Signed)
-   We will continue his BuSpar, diazepam and Lexapro.

## 2022-07-12 NOTE — ED Notes (Signed)
Patient given coffee and creamer per request. Patient room cleaned of the breakfast tray that patient did not want. Patient yelled at RN for throwing away condiments that she wanted. RN asked want she needed and informed her that she could collect new ones. Patient refused to answer RN. Patient informed to let her know if she changed her mind.

## 2022-07-12 NOTE — Progress Notes (Signed)
[place] for heparin infusion Indication: ACS/STEMI  Allergies  Allergen Reactions   Buprenorphine Hcl     Nausea and vomiting   Carbamazepine Anaphylaxis and Other (See Comments)    Blood dyscrasia when on with Lithium   Lithium Other (See Comments)    Blood dyscrasia   Lodine [Etodolac] Shortness Of Breath, Diarrhea, Nausea And Vomiting and Rash   Methocarbamol Other (See Comments) and Shortness Of Breath    Unknown  Rash, tachypnea  Rash, tachypnea    Unknown Rash, tachypnea   Trazodone Other (See Comments)   Levofloxacin In D5w Swelling and Other (See Comments)    "Salty" sensation in mouth. "Hard time to explain it"   Tape Other (See Comments)    Welps, blister on skin   Wound Dressing Adhesive    Relafen [Nabumetone] Diarrhea, Nausea And Vomiting and Rash    Patient Measurements: Height: 5\' 2"  (157.5 cm) Weight: 57.5 kg (126 lb 12.2 oz) IBW/kg (Calculated) : 50.1 Heparin Dosing Weight: 57.5 kg  Vital Signs: BP: 108/57 (03/23 1245) Pulse Rate: 72 (03/23 1245)  Labs: Recent Labs    07/11/22 2008 07/11/22 2204 07/11/22 2240 07/12/22 0509 07/12/22 1252 07/12/22 1400  HGB 14.4  --   --  12.4  --   --   HCT 43.6  --   --  38.1  --   --   PLT 333  --   --  264  --   --   APTT  --   --  27  --   --   --   LABPROT  --   --  12.7 13.3  --   --   INR  --   --  1.0 1.0  --   --   HEPARINUNFRC  --   --   --  0.16* >1.10* <0.10*  CREATININE 0.57  --   --  0.62  --   --   TROPONINIHS [id number]*  --  22,293* 17,514*  --      Estimated Creatinine Clearance: 59.9 mL/min (by C-G formula based on SCr of 0.62 mg/dL).   Medical History: Past Medical History:  Diagnosis Date   Anxiety    Arthritis    "qwhere" (06/14/2014)   Bipolar disorder ([place])    CAD (coronary artery disease)    a. 2008 DES->RCA; b. 2012 PCI: 97mRCA s/p DES; b. 05/2014 DES-> 90 dRCA; c. 11/16 PCI: dRCA ISR->DES;  d. 4/18 PCI: p-mRCA 95% s/p PCI/DES; e.  02/2017 Cath: patent dRCA stent, RPDA 90ost->Med Rx; f. 05/2017 PTCA mRCA 2/2 ISR; g. 05/2019 Cath: stable dzs; f. 08/2020 PCI: LM nl, LAD nl, D1 40, LCX 60p/m, OM1 50, RCA 20p/m, 131m/d (3.0x18 & 3.0x15 Resolute Onyx DESs), PDA 80, RPDA 80, RPAV 100. EF 50-55.   Chronic bronchitis ([place])    "get it q yr"   Claudication in peripheral vascular disease ([place])    Daily headache    "when my blood pressure is up" (06/14/2014)   Depression    Diastolic dysfunction    a. 02/2017 Echo: EF 55-60%, no rwma, Gr1 DD, nl RV fxn.   Diastolic dysfunction    a. 08/2020 Echo: EF 55-60%, mod LVH, grI DD. NL RV fxn.   GERD (gastroesophageal reflux disease)    Heart murmur    High cholesterol    History of stomach ulcers    Hypertension    Insomnia    Kidney stones    Migraine    "  haven't had them in a good while; did get them 1-2 times/yr" (06/14/2014)   Myocardial infarction ([place]) 2008; ~ 2012   PAD (peripheral artery disease) ([place])    a. 01/2021 LE duplex: RCFA 30-49, R AT 30-49, LCFA 50-74, L Pop 50-74, L AT 50-74, L Peroneal 100.   Pneumonia    "get it 1-2 times/year" (06/14/2014)   RLS (restless legs syndrome)    Seizures (HCC)    Sleep apnea    "they want me to do the lab but I haven't" (06/14/2014)   Stroke [facility])    "they say I've had several" (06/14/2014)   Tobacco abuse    Type II diabetes mellitus (HCC)    Urinary, incontinence, stress female     Assessment: Pt is a 62 yo female presenting to ED c/o mid-sternal CP, radiating to neck & jaw, found with elevated Troponin I.  Goal of Therapy:  Heparin level 0.3-0.7 units/ml Monitor platelets by anticoagulation protocol: Yes   03/23 0509 HL 0.16, subtherapeutic 3/23 1400 HL < 0.1  Plan:  Heparin level is subtherapeutic. Will give heparin bolus of 2000 units x 1. Will increase the heparin infusion to 1050 units/hr. Recheck heparin level in 6 hours. CBC daily while on heparin.   Eleonore Chiquito, PharmD, 07/12/2022 3:30 PM

## 2022-07-12 NOTE — Assessment & Plan Note (Signed)
-   The patient will be placed on supplement coverage with NovoLog. - We will continue basal coverage. - We will continue Jardiance.

## 2022-07-13 DIAGNOSIS — I214 Non-ST elevation (NSTEMI) myocardial infarction: Secondary | ICD-10-CM | POA: Diagnosis not present

## 2022-07-13 DIAGNOSIS — F17200 Nicotine dependence, unspecified, uncomplicated: Secondary | ICD-10-CM

## 2022-07-13 DIAGNOSIS — F1210 Cannabis abuse, uncomplicated: Secondary | ICD-10-CM

## 2022-07-13 DIAGNOSIS — R079 Chest pain, unspecified: Secondary | ICD-10-CM

## 2022-07-13 LAB — CBC
HCT: 36.1 % (ref 36.0–46.0)
Hemoglobin: 11.8 g/dL — ABNORMAL LOW (ref 12.0–15.0)
MCH: 31 pg (ref 26.0–34.0)
MCHC: 32.7 g/dL (ref 30.0–36.0)
MCV: 94.8 fL (ref 80.0–100.0)
Platelets: 234 10*3/uL (ref 150–400)
RBC: 3.81 MIL/uL — ABNORMAL LOW (ref 3.87–5.11)
RDW: 12.8 % (ref 11.5–15.5)
WBC: 8.7 10*3/uL (ref 4.0–10.5)
nRBC: 0 % (ref 0.0–0.2)

## 2022-07-13 LAB — HEPARIN LEVEL (UNFRACTIONATED)
Heparin Unfractionated: <0.1 IU/mL — ABNORMAL LOW (ref 0.30–0.70)
Heparin Unfractionated: 0.55 IU/mL (ref 0.30–0.70)
Heparin Unfractionated: 0.47 IU/mL (ref 0.30–0.70)

## 2022-07-13 MED ORDER — TICAGRELOR 90 MG PO TABS
90.0000 mg | ORAL_TABLET | Freq: Two times a day (BID) | ORAL | Status: DC
  Administered 2022-07-13 – 2022-07-14 (×3): 90 mg via ORAL
  Filled 2022-07-13 (×3): qty 1

## 2022-07-13 MED ORDER — HEPARIN BOLUS VIA INFUSION
2000.0000 [IU] | Freq: Once | INTRAVENOUS | Status: AC
  Administered 2022-07-13: 2000 [IU] via INTRAVENOUS
  Filled 2022-07-13: qty 2000

## 2022-07-13 MED ORDER — SODIUM CHLORIDE 0.9% FLUSH
3.0000 mL | Freq: Two times a day (BID) | INTRAVENOUS | Status: DC
  Administered 2022-07-13 – 2022-07-14 (×3): 3 mL via INTRAVENOUS

## 2022-07-13 MED ORDER — LOSARTAN POTASSIUM 25 MG PO TABS
12.5000 mg | ORAL_TABLET | Freq: Every day | ORAL | Status: DC
  Administered 2022-07-14: 12.5 mg via ORAL
  Filled 2022-07-13 (×2): qty 1

## 2022-07-13 NOTE — Progress Notes (Signed)
Bp 96/61 map 73.  NtG gtt stopped.  Notified Pablo Ledger. NP.

## 2022-07-13 NOTE — Progress Notes (Signed)
Cardiology Progress Note   Patient Name: Jennifer [name] Date of Encounter: 07/13/2022  Primary Cardiologist: [name], MD  Subjective   No chest pain or sob.  Had episode of nausea and dry heaving this AM.  On tele, noted to have complete heart block w/ ventricular escape rates in the 20's for very brief period of time followed by 2:1 AV block.  Total duration of bradycardia ~ 1 min.  Sinus rhythm otherwise.  Inpatient Medications    Scheduled Meds:  aspirin EC  81 mg Oral Daily   atorvastatin  80 mg Oral Daily   empagliflozin  25 mg Oral Daily   gabapentin  900 mg Oral TID WC & HS   lisinopril  10 mg Oral Daily   metoprolol succinate  50 mg Oral Daily   QUEtiapine  200 mg Oral QHS   ranolazine  1,000 mg Oral BID   sodium chloride flush  3 mL Intravenous Q12H   ticagrelor  90 mg Oral BID   Continuous Infusions:  heparin 1,200 Units/hr (07/13/22 0056)   nitroGLYCERIN Stopped (07/13/22 0218)   PRN Meds: acetaminophen, albuterol, clonazePAM, HYDROcodone-acetaminophen, magnesium hydroxide, nitroGLYCERIN, ondansetron (ZOFRAN) IV, tiZANidine   Vital Signs    Vitals:   07/12/22 2034 07/13/22 0006 07/13/22 0215 07/13/22 0436  BP: 103/67 90/62 96/61  (!) 85/61  Pulse: 67 75  69  Resp: 20 20  13   Temp: 98.9 F (37.2 C) 97.9 F (36.6 C)  97.7 F (36.5 C)  TempSrc:  Oral  Oral  SpO2: 100% 93%  94%  Weight:      Height:        Intake/Output Summary (Last 24 hours) at 07/13/2022 0736 Last data filed at 07/13/2022 0300 Gross per 24 hour  Intake 761.96 ml  Output --  Net 761.96 ml   Filed Weights   07/11/22 2002  Weight: 57.5 kg    Physical Exam   GEN: Well nourished, well developed, in no acute distress.  HEENT: Grossly normal.  Neck: Supple, no JVD, carotid bruits, or masses. Cardiac: RRR, 2/6 syst murmur @ upper sternal borders, no rubs or gallops. No clubbing, cyanosis, edema.  Radials 2+, DP/PT 1+ and equal bilaterally.  Respiratory:  Respirations regular  and unlabored, clear to auscultation bilaterally. GI: Soft, nontender, nondistended, BS + x 4. MS: no deformity or atrophy. Skin: warm and dry, no rash. Neuro:  Strength and sensation are intact. Psych: AAOx3.  Normal affect.  Labs    Chemistry Recent Labs  Lab 07/11/22 2008 07/12/22 0509  NA 140 139  K 3.8 3.6  CL 103 109  CO2 26 25  GLUCOSE 254* 167*  BUN 19 14  CREATININE 0.57 0.62  CALCIUM 9.6 8.4*  GFRNONAA >60 >60  ANIONGAP 11 5     Hematology Recent Labs  Lab 07/11/22 2008 07/12/22 0509 07/13/22 0711  WBC 17.0* 10.2 8.7  RBC 4.62 4.00 3.81*  HGB 14.4 12.4 11.8*  HCT 43.6 38.1 36.1  MCV 94.4 95.3 94.8  MCH 31.2 31.0 31.0  MCHC 33.0 32.5 32.7  RDW 12.8 12.7 12.8  PLT 333 264 234    Cardiac Enzymes  Recent Labs  Lab 07/11/22 2008 07/11/22 2204 07/12/22 0509 07/12/22 1252  TROPONINIHS 9,887* 12,616* 22,293* 17,514*      Lipids  Lab Results  Component Value Date   CHOL 105 07/12/2022   HDL 47 07/12/2022   LDLCALC 39 07/12/2022   LDLDIRECT 183.5 (H) 07/27/2020   TRIG 94 07/12/2022  CHOLHDL 2.2 07/12/2022    HbA1c  Lab Results  Component Value Date   HGBA1C 14.3 (H) 09/05/2020    Radiology    DG Chest 2 View  Result Date: 07/11/2022 CLINICAL DATA:  CP EXAM: CHEST - 2 VIEW COMPARISON:  09/04/2020 FINDINGS: Lungs are clear. Heart size and mediastinal contours are within normal limits. Coronary stent. No effusion.  No pneumothorax. Right shoulder DJD with orthopedic anchor. Vertebral endplate spurring at multiple levels in the mid thoracic spine. IMPRESSION: No acute cardiopulmonary disease. Electronically Signed   By: Lucrezia Europe M.D.   On: 07/11/2022 20:12    Telemetry    Predominantly sinus rhythm in the 70's.  At 7:25 am, she developed complete heart block w/ V rates in the 20's followed by 2:1 AVB.  Episode resolved by 7:26 am.  Sinus in the 70's-80's since - Personally Reviewed  Cardiac Studies   2D Echocardiogram 3.23.2024  1.  Left ventricular ejection fraction, by estimation, is 40 to 45%. The  left ventricle has mildly decreased function. The left ventricle  demonstrates regional wall motion abnormalities (severe inferior/posterior  wallhypokinesis). There is moderate  asymmetric left ventricular hypertrophy of the septal segment. Left  ventricular diastolic parameters are consistent with Grade I diastolic  dysfunction (impaired relaxation).   2. Right ventricular systolic function is normal. The right ventricular  size is normal. Tricuspid regurgitation signal is inadequate for assessing  PA pressure.   3. The mitral valve is normal in structure. Moderate mitral valve  regurgitation. No evidence of mitral stenosis.   4. The aortic valve is normal in structure. Aortic valve regurgitation is  mild. No aortic stenosis is present.   5. The inferior vena cava is normal in size with greater than 50%  respiratory variability, suggesting right atrial pressure of 3 mmHg. _____________   Patient Profile     a 62 y.o. female with a history of CAD status post multiple right coronary artery interventions, hypertension, hyperlipidemia, depression, anxiety, bipolar disorder, diabetes, tobacco abuse, and PAD, who presented 3/22 w/ a 1 day h/o chest pain, n, v, and late presenting inferolateral STEMI.  HsTrop [id number]  [id number]  [id number].  Assessment & Plan    1.  Acute coronary syndrome/Late-presenting inferolateral STEMI/CAD:  Patient with a history of CAD status post multiple right coronary artery interventions in the setting of recurrent in-stent restenosis. She was last intervened upon in May 2002, at which time she had occlusive in-stent restenosis in the distal RCA as well as moderate circumflex, obtuse marginal, and diagonal disease, and more severe right-sided distal branch vessel disease with left-to-right collaterals. The distal RCA was successfully treated with drug-eluting stent. She smoked a condensed form of  marijuana on the evening of 3/21 and developed chest pain that persisted throughout the night, radiating to her jaw, assoc w/ dyspnea, and later assoc w/ n/v on 3/22, eventually prompting ED eval, nearly 24 hrs after onset.  ECG w/ subtle inferolateral ST segment elevation w/ coving and associated TWI.  Discussed w/ interventional cardiology 3/23 - pt reported being pain free and decision was made to defer cath until 3/26.  No chest pain overnight. IV ntg d/c'd 2/2 headache and soft BPs.  Brief episode of nausea and dry heaving assoc w/ CHB and 2:1 AVB x ~ 1 min this AM.  Will d/c ? blocker.  Cont asa, brilinta (increase to 90 bid), high potency statin, ranexa, and heparin.  2.  Ischemic cardiomyopathy:  EF 40-45% w/ severe inf/posterior  HK on echo 3/23 - new findings.  Euvolemic on exam.  D/c ? blocker 2/2 CHB/2:1 AVB this AM.  Will change lisinopril to low-dose losartan w/ eye toward transitioning to entresto if pressure allows - currently soft.  Hold off on mra for now.  Cont jardiance.  Plan cath 3/26.  3.  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 Total duration of heart block ~ 1 min.  Otherwise sinus rhythm in the 70's to 80's.  D/c ? blocker.  Follow tele.  No indication for temp wire @ this time.  Await cath/revasc.  May need outpt monitoring.  4.  Essential HTN:  BPs soft.  Follow.  5.  DMII:  per IM.  6.  HL:  LDL of 39.  Cont high potency statin rx.  7.  Tob abuse:  smoking > 1ppd.  Smoking cessation advised.  8.  Marijuana/polysubstance abuse:  Admits to using marijuana and occasionally a very concentrated form.  Drug screen + for methamphetamines and TCA as well - she was not aware of this.  Discussed importance of illicit drug avoidance.  9.  PAD:  moderate bilateral lower ext PAD w/ L peroneal occlusion noted on duplex in 2022.  Chronic leg pain.  Medically managed.  Needs to quit  smoking.  Signed, [name] Hodgkins, NP  07/13/2022, 7:36 AM    For questions or updates, please contact   Please consult [email or web address] for contact info under Cardiology/STEMI.

## 2022-07-13 NOTE — Progress Notes (Signed)
[facility] for heparin infusion Indication: ACS/STEMI  Allergies  Allergen Reactions   Buprenorphine Hcl     Nausea and vomiting   Carbamazepine Anaphylaxis and Other (See Comments)    Blood dyscrasia when on with Lithium   Lithium Other (See Comments)    Blood dyscrasia   Lodine [Etodolac] Shortness Of Breath, Diarrhea, Nausea And Vomiting and Rash   Methocarbamol Other (See Comments) and Shortness Of Breath    Unknown  Rash, tachypnea  Rash, tachypnea    Unknown Rash, tachypnea   Trazodone Other (See Comments)   Levofloxacin In D5w Swelling and Other (See Comments)    "Salty" sensation in mouth. "Hard time to explain it"   Tape Other (See Comments)    Welps, blister on skin   Wound Dressing Adhesive    Relafen [Nabumetone] Diarrhea, Nausea And Vomiting and Rash    Patient Measurements: Height: 5\' 2"  (157.5 cm) Weight: 57.5 kg (126 lb 12.2 oz) IBW/kg (Calculated) : 50.1 Heparin Dosing Weight: 57.5 kg  Vital Signs: Temp: 98.4 F (36.9 C) (03/24 1200) Temp Source: Oral (03/24 1200) BP: 110/67 (03/24 1200) Pulse Rate: 65 (03/24 1200)  Labs: Recent Labs    07/11/22 2008 07/11/22 2008 07/11/22 2204 07/11/22 2240 07/12/22 0509 07/12/22 1252 07/12/22 1400 07/12/22 2200 07/13/22 0711 07/13/22 1355  HGB 14.4  --   --   --  12.4  --   --   --  11.8*  --   HCT 43.6  --   --   --  38.1  --   --   --  36.1  --   PLT 333  --   --   --  264  --   --   --  234  --   APTT  --   --   --  27  --   --   --   --   --   --   LABPROT  --   --   --  12.7 13.3  --   --   --   --   --   INR  --   --   --  1.0 1.0  --   --   --   --   --   HEPARINUNFRC  --    < >  --   --  0.16* >1.10*   < > <0.10* 0.55 0.47  CREATININE 0.57  --   --   --  0.62  --   --   --   --   --   TROPONINIHS [id number]*  --  12,616*  --  22,293* 17,514*  --   --   --   --    < > = values in this interval not displayed.     Estimated Creatinine Clearance: 59.9 mL/min  (by C-G formula based on SCr of 0.62 mg/dL).   Medical History: Past Medical History:  Diagnosis Date   Anxiety    Arthritis    "qwhere" (06/14/2014)   Bipolar disorder ([place])    CAD (coronary artery disease)    a. 2008 DES->RCA; b. 2012 PCI: 12mRCA s/p DES; b. 05/2014 DES-> 90 dRCA; c. 11/16 PCI: dRCA ISR->DES;  d. 4/18 PCI: p-mRCA 95% s/p PCI/DES; e. 02/2017 Cath: patent dRCA stent, RPDA 90ost->Med Rx; f. 05/2017 PTCA mRCA 2/2 ISR; g. 05/2019 Cath: stable dzs; f. 08/2020 PCI: LM nl, LAD nl, D1 40, LCX 60p/m, OM1 50, RCA 20p/m, 173m/d (3.0x18 &  3.0x15 Resolute Onyx DESs), PDA 80, RPDA 80, RPAV 100. EF 50-55.   Chronic bronchitis ([place])    "get it q yr"   Claudication in peripheral vascular disease ([place])    Daily headache    "when my blood pressure is up" (06/14/2014)   Depression    Diastolic dysfunction    a. 02/2017 Echo: EF 55-60%, no rwma, Gr1 DD, nl RV fxn.   Diastolic dysfunction    a. 08/2020 Echo: EF 55-60%, mod LVH, grI DD. NL RV fxn.   GERD (gastroesophageal reflux disease)    Heart murmur    High cholesterol    History of stomach ulcers    Hypertension    Insomnia    Kidney stones    Migraine    "haven't had them in a good while; did get them 1-2 times/yr" (06/14/2014)   Myocardial infarction ([place]) 2008; ~ 2012   PAD (peripheral artery disease) ([place])    a. 01/2021 LE duplex: RCFA 30-49, R AT 30-49, LCFA 50-74, L Pop 50-74, L AT 50-74, L Peroneal 100.   Pneumonia    "get it 1-2 times/year" (06/14/2014)   RLS (restless legs syndrome)    Seizures ([facility])    Sleep apnea    "they want me to do the lab but I haven't" (06/14/2014)   Stroke [facility])    "they say I've had several" (06/14/2014)   Tobacco abuse    Type II diabetes mellitus ([facility])    Urinary, incontinence, stress female     Assessment: Pt is a 62 yo female presenting to ED c/o mid-sternal CP, radiating to neck & jaw, found with elevated Troponin I. No chronic PTA AC noted from chart review.  Goal of Therapy:  Heparin  level 0.3-0.7 units/ml Monitor platelets by anticoagulation protocol: Yes   03/23 0509 HL 0.16, subtherapeutic 3/23 1400 HL < 0.1 3/23 2200 HL < 0.1, subtherapeutic 3/24 0711 HL 0.55 3/24 1441 HL 0.47  Plan: Heparin level is therapeutic x 2. Will continue heparin infusion at 1200 units/hr. Recheck heparin level with AM labs. CBC daily while on heparin. Plan for cath Monday.  Will M. Ouida Sills, PharmD PGY-1 Pharmacy Resident 07/13/2022 2:45 PM

## 2022-07-13 NOTE — H&P (View-Only) (Signed)
Cardiology Progress Note   Patient Name: Jennifer [name] Date of Encounter: 07/13/2022  Primary Cardiologist: [name], MD  Subjective   No chest pain or sob.  Had episode of nausea and dry heaving this AM.  On tele, noted to have complete heart block w/ ventricular escape rates in the 20's for very brief period of time followed by 2:1 AV block.  Total duration of bradycardia ~ 1 min.  Sinus rhythm otherwise.  Inpatient Medications    Scheduled Meds:  aspirin EC  81 mg Oral Daily   atorvastatin  80 mg Oral Daily   empagliflozin  25 mg Oral Daily   gabapentin  900 mg Oral TID WC & HS   lisinopril  10 mg Oral Daily   metoprolol succinate  50 mg Oral Daily   QUEtiapine  200 mg Oral QHS   ranolazine  1,000 mg Oral BID   sodium chloride flush  3 mL Intravenous Q12H   ticagrelor  90 mg Oral BID   Continuous Infusions:  heparin 1,200 Units/hr (07/13/22 0056)   nitroGLYCERIN Stopped (07/13/22 0218)   PRN Meds: acetaminophen, albuterol, clonazePAM, HYDROcodone-acetaminophen, magnesium hydroxide, nitroGLYCERIN, ondansetron (ZOFRAN) IV, tiZANidine   Vital Signs    Vitals:   07/12/22 2034 07/13/22 0006 07/13/22 0215 07/13/22 0436  BP: 103/67 90/62 96/61  (!) 85/61  Pulse: 67 75  69  Resp: 20 20  13   Temp: 98.9 F (37.2 C) 97.9 F (36.6 C)  97.7 F (36.5 C)  TempSrc:  Oral  Oral  SpO2: 100% 93%  94%  Weight:      Height:        Intake/Output Summary (Last 24 hours) at 07/13/2022 0736 Last data filed at 07/13/2022 0300 Gross per 24 hour  Intake 761.96 ml  Output --  Net 761.96 ml   Filed Weights   07/11/22 2002  Weight: 57.5 kg    Physical Exam   GEN: Well nourished, well developed, in no acute distress.  HEENT: Grossly normal.  Neck: Supple, no JVD, carotid bruits, or masses. Cardiac: RRR, 2/6 syst murmur @ upper sternal borders, no rubs or gallops. No clubbing, cyanosis, edema.  Radials 2+, DP/PT 1+ and equal bilaterally.  Respiratory:  Respirations regular  and unlabored, clear to auscultation bilaterally. GI: Soft, nontender, nondistended, BS + x 4. MS: no deformity or atrophy. Skin: warm and dry, no rash. Neuro:  Strength and sensation are intact. Psych: AAOx3.  Normal affect.  Labs    Chemistry Recent Labs  Lab 07/11/22 2008 07/12/22 0509  NA 140 139  K 3.8 3.6  CL 103 109  CO2 26 25  GLUCOSE 254* 167*  BUN 19 14  CREATININE 0.57 0.62  CALCIUM 9.6 8.4*  GFRNONAA >60 >60  ANIONGAP 11 5     Hematology Recent Labs  Lab 07/11/22 2008 07/12/22 0509 07/13/22 0711  WBC 17.0* 10.2 8.7  RBC 4.62 4.00 3.81*  HGB 14.4 12.4 11.8*  HCT 43.6 38.1 36.1  MCV 94.4 95.3 94.8  MCH 31.2 31.0 31.0  MCHC 33.0 32.5 32.7  RDW 12.8 12.7 12.8  PLT 333 264 234    Cardiac Enzymes  Recent Labs  Lab 07/11/22 2008 07/11/22 2204 07/12/22 0509 07/12/22 1252  TROPONINIHS 9,887* 12,616* 22,293* 17,514*      Lipids  Lab Results  Component Value Date   CHOL 105 07/12/2022   HDL 47 07/12/2022   LDLCALC 39 07/12/2022   LDLDIRECT 183.5 (H) 07/27/2020   TRIG 94 07/12/2022  CHOLHDL 2.2 07/12/2022    HbA1c  Lab Results  Component Value Date   HGBA1C 14.3 (H) 09/05/2020    Radiology    DG Chest 2 View  Result Date: 07/11/2022 CLINICAL DATA:  CP EXAM: CHEST - 2 VIEW COMPARISON:  09/04/2020 FINDINGS: Lungs are clear. Heart size and mediastinal contours are within normal limits. Coronary stent. No effusion.  No pneumothorax. Right shoulder DJD with orthopedic anchor. Vertebral endplate spurring at multiple levels in the mid thoracic spine. IMPRESSION: No acute cardiopulmonary disease. Electronically Signed   By: Lucrezia Europe M.D.   On: 07/11/2022 20:12    Telemetry    Predominantly sinus rhythm in the 70's.  At 7:25 am, she developed complete heart block w/ V rates in the 20's followed by 2:1 AVB.  Episode resolved by 7:26 am.  Sinus in the 70's-80's since - Personally Reviewed  Cardiac Studies   2D Echocardiogram 3.23.2024  1.  Left ventricular ejection fraction, by estimation, is 40 to 45%. The  left ventricle has mildly decreased function. The left ventricle  demonstrates regional wall motion abnormalities (severe inferior/posterior  wallhypokinesis). There is moderate  asymmetric left ventricular hypertrophy of the septal segment. Left  ventricular diastolic parameters are consistent with Grade I diastolic  dysfunction (impaired relaxation).   2. Right ventricular systolic function is normal. The right ventricular  size is normal. Tricuspid regurgitation signal is inadequate for assessing  PA pressure.   3. The mitral valve is normal in structure. Moderate mitral valve  regurgitation. No evidence of mitral stenosis.   4. The aortic valve is normal in structure. Aortic valve regurgitation is  mild. No aortic stenosis is present.   5. The inferior vena cava is normal in size with greater than 50%  respiratory variability, suggesting right atrial pressure of 3 mmHg. _____________   Patient Profile     a 62 y.o. female with a history of CAD status post multiple right coronary artery interventions, hypertension, hyperlipidemia, depression, anxiety, bipolar disorder, diabetes, tobacco abuse, and PAD, who presented 3/22 w/ a 1 day h/o chest pain, n, v, and late presenting inferolateral STEMI.  HsTrop [id number]  [id number]  [id number].  Assessment & Plan    1.  Acute coronary syndrome/Late-presenting inferolateral STEMI/CAD:  Patient with a history of CAD status post multiple right coronary artery interventions in the setting of recurrent in-stent restenosis. She was last intervened upon in May 2002, at which time she had occlusive in-stent restenosis in the distal RCA as well as moderate circumflex, obtuse marginal, and diagonal disease, and more severe right-sided distal branch vessel disease with left-to-right collaterals. The distal RCA was successfully treated with drug-eluting stent. She smoked a condensed form of  marijuana on the evening of 3/21 and developed chest pain that persisted throughout the night, radiating to her jaw, assoc w/ dyspnea, and later assoc w/ n/v on 3/22, eventually prompting ED eval, nearly 24 hrs after onset.  ECG w/ subtle inferolateral ST segment elevation w/ coving and associated TWI.  Discussed w/ interventional cardiology 3/23 - pt reported being pain free and decision was made to defer cath until 3/26.  No chest pain overnight. IV ntg d/c'd 2/2 headache and soft BPs.  Brief episode of nausea and dry heaving assoc w/ CHB and 2:1 AVB x ~ 1 min this AM.  Will d/c ? blocker.  Cont asa, brilinta (increase to 90 bid), high potency statin, ranexa, and heparin.  2.  Ischemic cardiomyopathy:  EF 40-45% w/ severe inf/posterior  HK on echo 3/23 - new findings.  Euvolemic on exam.  D/c ? blocker 2/2 CHB/2:1 AVB this AM.  Will change lisinopril to low-dose losartan w/ eye toward transitioning to entresto if pressure allows - currently soft.  Hold off on mra for now.  Cont jardiance.  Plan cath 3/26.  3.  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 Total duration of heart block ~ 1 min.  Otherwise sinus rhythm in the 70's to 80's.  D/c ? blocker.  Follow tele.  No indication for temp wire @ this time.  Await cath/revasc.  May need outpt monitoring.  4.  Essential HTN:  BPs soft.  Follow.  5.  DMII:  per IM.  6.  HL:  LDL of 39.  Cont high potency statin rx.  7.  Tob abuse:  smoking > 1ppd.  Smoking cessation advised.  8.  Marijuana/polysubstance abuse:  Admits to using marijuana and occasionally a very concentrated form.  Drug screen + for methamphetamines and TCA as well - she was not aware of this.  Discussed importance of illicit drug avoidance.  9.  PAD:  moderate bilateral lower ext PAD w/ L peroneal occlusion noted on duplex in 2022.  Chronic leg pain.  Medically managed.  Needs to quit  smoking.  Signed, [name] Hodgkins, NP  07/13/2022, 7:36 AM    For questions or updates, please contact   Please consult [email or web address] for contact info under Cardiology/STEMI.

## 2022-07-13 NOTE — Progress Notes (Signed)
[facility] [place] NAME: Jennifer [name]    MR#:  [id number]  DATE OF BIRTH:  June 04, 1960  SUBJECTIVE:  complains of headache. Seen along with cardiology in the room. Patient of mitral drip. No chest pain at present. Continue heparin drip noted to have short run of heart block on telemonitoring around 730 this morning  VITALS:  Blood pressure 110/67, pulse 65, temperature 98.4 F (36.9 C), temperature source Oral, resp. rate 15, height 5\' 2"  (1.575 m), weight 57.5 kg, SpO2 95 %.  PHYSICAL EXAMINATION:   GENERAL:  62 y.o.-year-old patient with no acute distress.  LUNGS: Normal breath sounds bilaterally, no wheezing CARDIOVASCULAR: S1, S2 normal. No murmur   ABDOMEN: Soft, nontender, nondistended. Bowel sounds present.  EXTREMITIES: No  edema b/l.    NEUROLOGIC: nonfocal  patient is alert and awake SKIN: No obvious rash, lesion, or ulcer.   LABORATORY PANEL:  CBC Recent Labs  Lab 07/13/22 0711  WBC 8.7  HGB 11.8*  HCT 36.1  PLT 234     Chemistries  Recent Labs  Lab 07/12/22 0509  NA 139  K 3.6  CL 109  CO2 25  GLUCOSE 167*  BUN 14  CREATININE 0.62  CALCIUM 8.4*     RADIOLOGY:  ECHOCARDIOGRAM COMPLETE  Result Date: 07/12/2022    ECHOCARDIOGRAM REPORT   Patient Name:   Jennifer [name] Date of Exam: 07/12/2022 Medical Rec #:  [id number]      Height:       62.0 in Accession #:    [id number]     Weight:       126.8 lb Date of Birth:  [birth year]     BSA:          1.575 m Patient Age:    21 years       BP:           148/96 mmHg Patient Gender: F              HR:           69 bpm. Exam Location:  [facility] Procedure: 2D Echo, Color Doppler and Cardiac Doppler Indications:     NSTEMI I21.4  History:         Patient has prior history of Echocardiogram examinations. CAD                  and Previous Myocardial Infarction, Stroke and Bronchitis; Risk                  Factors:Current Smoker, Diabetes, Hypertension and                   Dyslipidemia.  Sonographer:     L. Thornton-Maynard Referring Phys:  [id number] [place] Diagnosing Phys: [name] MD IMPRESSIONS  1. Left ventricular ejection fraction, by estimation, is 40 to 45%. The left ventricle has mildly decreased function. The left ventricle demonstrates regional wall motion abnormalities (severe inferior/posterior wallhypokinesis). There is moderate asymmetric left ventricular hypertrophy of the septal segment. Left ventricular diastolic parameters are consistent with Grade I diastolic dysfunction (impaired relaxation).  2. Right ventricular systolic function is normal. The right ventricular size is normal. Tricuspid regurgitation signal is inadequate for assessing PA pressure.  3. The mitral valve is normal in structure. Moderate mitral valve regurgitation. No evidence of mitral stenosis.  4. The aortic valve is normal in structure. Aortic valve regurgitation is mild. No aortic stenosis is present.  5. The inferior vena cava is normal in size with greater than 50% respiratory variability, suggesting right atrial pressure of 3 mmHg. FINDINGS  Left Ventricle: Left ventricular ejection fraction, by estimation, is 40 to 45%. The left ventricle has mildly decreased function. The left ventricle demonstrates regional wall motion abnormalities. The left ventricular internal cavity size was normal in size. There is moderate asymmetric left ventricular hypertrophy of the septal segment. Left ventricular diastolic parameters are consistent with Grade I diastolic dysfunction (impaired relaxation). Right Ventricle: The right ventricular size is normal. No increase in right ventricular wall thickness. Right ventricular systolic function is normal. Tricuspid regurgitation signal is inadequate for assessing PA pressure. Left Atrium: Left atrial size was normal in size. Right Atrium: Right atrial size was normal in size. Pericardium: There is no evidence of pericardial effusion. Mitral Valve: The  mitral valve is normal in structure. Moderate mitral valve regurgitation. No evidence of mitral valve stenosis. MV peak gradient, 4.4 mmHg. The mean mitral valve gradient is 2.0 mmHg. Tricuspid Valve: The tricuspid valve is normal in structure. Tricuspid valve regurgitation is mild . No evidence of tricuspid stenosis. Aortic Valve: The aortic valve is normal in structure. Aortic valve regurgitation is mild. Aortic regurgitation PHT measures 383 msec. No aortic stenosis is present. Aortic valve mean gradient measures 4.5 mmHg. Aortic valve peak gradient measures 7.8 mmHg. Aortic valve area, by VTI measures 2.16 cm. Pulmonic Valve: The pulmonic valve was normal in structure. Pulmonic valve regurgitation is not visualized. No evidence of pulmonic stenosis. Aorta: The aortic root is normal in size and structure. Venous: The inferior vena cava is normal in size with greater than 50% respiratory variability, suggesting right atrial pressure of 3 mmHg. IAS/Shunts: No atrial level shunt detected by color flow Doppler.  LEFT VENTRICLE PLAX 2D LVIDd:         5.30 cm      Diastology LVIDs:         4.20 cm      LV e' medial:    5.33 cm/s LV PW:         1.00 cm      LV E/e' medial:  11.8 LV IVS:        1.80 cm      LV e' lateral:   7.40 cm/s LVOT diam:     2.20 cm      LV E/e' lateral: 8.5 LV SV:         56 LV SV Index:   36 LVOT Area:     3.80 cm  LV Volumes (MOD) LV vol d, MOD A2C: 94.8 ml LV vol d, MOD A4C: 107.0 ml LV vol s, MOD A2C: 42.4 ml LV vol s, MOD A4C: 50.6 ml LV SV MOD A2C:     52.4 ml LV SV MOD A4C:     107.0 ml LV SV MOD BP:      55.7 ml RIGHT VENTRICLE             IVC RV Basal diam:  3.00 cm     IVC diam: 1.70 cm RV S prime:     14.60 cm/s TAPSE (M-mode): 2.3 cm LEFT ATRIUM             Index        RIGHT ATRIUM           Index LA diam:        2.90 cm 1.84 cm/m   RA Area:     11.00 cm LA  Vol (A2C):   59.0 ml 37.46 ml/m  RA Volume:   24.40 ml  15.49 ml/m LA Vol (A4C):   39.3 ml 24.95 ml/m LA Biplane Vol:  48.0 ml 30.48 ml/m  AORTIC VALVE                    PULMONIC VALVE AV Area (Vmax):    1.97 cm     PV Vmax:       0.85 m/s AV Area (Vmean):   1.86 cm     PV Peak grad:  2.9 mmHg AV Area (VTI):     2.16 cm AV Vmax:           139.50 cm/s AV Vmean:          92.250 cm/s AV VTI:            0.261 m AV Peak Grad:      7.8 mmHg AV Mean Grad:      4.5 mmHg LVOT Vmax:         72.30 cm/s LVOT Vmean:        45.100 cm/s LVOT VTI:          0.148 m LVOT/AV VTI ratio: 0.57 AI PHT:            383 msec  AORTA Ao Root diam: 3.80 cm Ao Asc diam:  3.40 cm MITRAL VALVE MV Area (PHT): 3.91 cm       SHUNTS MV Area VTI:   2.08 cm       Systemic VTI:  0.15 m MV Peak grad:  4.4 mmHg       Systemic Diam: 2.20 cm MV Mean grad:  2.0 mmHg MV Vmax:       1.04 m/s MV Vmean:      65.4 cm/s MV Decel Time: 194 msec MR Peak grad:    140.2 mmHg MR Mean grad:    90.5 mmHg MR Vmax:         592.00 cm/s MR Vmean:        449.5 cm/s MR PISA:         2.26 cm MR PISA Eff ROA: 14 mm MR PISA Radius:  0.60 cm MV E velocity: 62.90 cm/s MV A velocity: 92.80 cm/s MV E/A ratio:  0.68 [name] MD Electronically signed by [name] MD Signature Date/Time: 07/12/2022/2:00:50 PM    Final    DG Chest 2 View  Result Date: 07/11/2022 CLINICAL DATA:  CP EXAM: CHEST - 2 VIEW COMPARISON:  09/04/2020 FINDINGS: Lungs are clear. Heart size and mediastinal contours are within normal limits. Coronary stent. No effusion.  No pneumothorax. Right shoulder DJD with orthopedic anchor. Vertebral endplate spurring at multiple levels in the mid thoracic spine. IMPRESSION: No acute cardiopulmonary disease. Electronically Signed   By: Lucrezia Europe M.D.   On: 07/11/2022 20:12    Assessment and Plan  Jennifer [name] is a 62 y.o. Caucasian female with medical history significant for coronary artery disease status post PCI and stents, chronic bronchitis, osteoarthritis, anxiety, bipolar disorder, dyslipidemia, hypertension, migraine, type 2 diabetes mellitus, seizure  disorder and OSA, who presented to the ER with acute onset of 8/10 pressure-like midsternal chest pain which started last night when she was smoking a dab of marijuana with her friends.   NSTEMI (non-ST elevated myocardial infarction) ([place]) in the setting of polysubstance drug abuse -  on IV heparin drip and titrate nitro gtt -- weaned down -  on aspirin as well as  as needed sublingual nitroglycerin and IV morphine sulfate for pain. - high-dose statin therapy, beta-blocker therapy. - 2D echo Left Ventricle: Left ventricular ejection fraction, by estimation, is 40  to 45%. The left ventricle has mildly decreased function. The left  ventricle demonstrates regional wall motion abnormalities. The left  ventricular internal cavity size was normal  -- [facility] cardiology consult noted--Cath Monday   Dyslipidemia - continue statin therapy .   Essential hypertension - continue antihypertensives.   GERD without esophagitis - continue PPI therapy.   Type 2 diabetes mellitus without complications ([place]) - on supplement coverage with NovoLog. - continue basal coverage. - continue Jardiance.   Anxiety and depression Bipolar DO -  continue his BuSpar, Klonopin prn and Lexapro.  Polysubstance abuse.  Patient advised cessation     DVT prophylaxis: IV heparin. Advanced Care Planning:  Code Status: full code.     Procedures: Family communication :none Consults :Cardiology CODE STATUS: full DVT Prophylaxis :heparin gtt Level of care: Progressive Status is: Inpatient Remains inpatient appropriate because: NSTEMI    TOTAL TIME TAKING CARE OF THIS PATIENT: 35 minutes.  >50% time spent on counselling and coordination of care  Note: This dictation was prepared with Dragon dictation along with smaller phrase technology. Any transcriptional errors that result from this process are unintentional.  Fritzi Mandes M.D    [facility]   CC: Primary care physician; Pcp, No

## 2022-07-13 NOTE — Progress Notes (Addendum)
[place] for heparin infusion Indication: ACS/STEMI  Allergies  Allergen Reactions   Buprenorphine Hcl     Nausea and vomiting   Carbamazepine Anaphylaxis and Other (See Comments)    Blood dyscrasia when on with Lithium   Lithium Other (See Comments)    Blood dyscrasia   Lodine [Etodolac] Shortness Of Breath, Diarrhea, Nausea And Vomiting and Rash   Methocarbamol Other (See Comments) and Shortness Of Breath    Unknown  Rash, tachypnea  Rash, tachypnea    Unknown Rash, tachypnea   Trazodone Other (See Comments)   Levofloxacin In D5w Swelling and Other (See Comments)    "Salty" sensation in mouth. "Hard time to explain it"   Tape Other (See Comments)    Welps, blister on skin   Wound Dressing Adhesive    Relafen [Nabumetone] Diarrhea, Nausea And Vomiting and Rash    Patient Measurements: Height: 5\' 2"  (157.5 cm) Weight: 57.5 kg (126 lb 12.2 oz) IBW/kg (Calculated) : 50.1 Heparin Dosing Weight: 57.5 kg  Vital Signs: Temp: 97.7 F (36.5 C) (03/24 0436) Temp Source: Oral (03/24 0436) BP: 85/61 (03/24 0436) Pulse Rate: 69 (03/24 0436)  Labs: Recent Labs    07/11/22 2008 07/11/22 2008 07/11/22 2204 07/11/22 2240 07/12/22 0509 07/12/22 1252 07/12/22 1400 07/12/22 2200 07/13/22 0711  HGB 14.4  --   --   --  12.4  --   --   --  11.8*  HCT 43.6  --   --   --  38.1  --   --   --  36.1  PLT 333  --   --   --  264  --   --   --  234  APTT  --   --   --  27  --   --   --   --   --   LABPROT  --   --   --  12.7 13.3  --   --   --   --   INR  --   --   --  1.0 1.0  --   --   --   --   HEPARINUNFRC  --    < >  --   --  0.16* >1.10* <0.10* <0.10* 0.55  CREATININE 0.57  --   --   --  0.62  --   --   --   --   TROPONINIHS [id number]*  --  12,616*  --  22,293* 17,514*  --   --   --    < > = values in this interval not displayed.     Estimated Creatinine Clearance: 59.9 mL/min (by C-G formula based on SCr of 0.62 mg/dL).   Medical  History: Past Medical History:  Diagnosis Date   Anxiety    Arthritis    "qwhere" (06/14/2014)   Bipolar disorder ([place])    CAD (coronary artery disease)    a. 2008 DES->RCA; b. 2012 PCI: 5mRCA s/p DES; b. 05/2014 DES-> 90 dRCA; c. 11/16 PCI: dRCA ISR->DES;  d. 4/18 PCI: p-mRCA 95% s/p PCI/DES; e. 02/2017 Cath: patent dRCA stent, RPDA 90ost->Med Rx; f. 05/2017 PTCA mRCA 2/2 ISR; g. 05/2019 Cath: stable dzs; f. 08/2020 PCI: LM nl, LAD nl, D1 40, LCX 60p/m, OM1 50, RCA 20p/m, 166m/d (3.0x18 & 3.0x15 Resolute Onyx DESs), PDA 80, RPDA 80, RPAV 100. EF 50-55.   Chronic bronchitis ([place])    "get it q yr"   Claudication in peripheral vascular  disease ([place])    Daily headache    "when my blood pressure is up" (06/14/2014)   Depression    Diastolic dysfunction    a. 02/2017 Echo: EF 55-60%, no rwma, Gr1 DD, nl RV fxn.   Diastolic dysfunction    a. 08/2020 Echo: EF 55-60%, mod LVH, grI DD. NL RV fxn.   GERD (gastroesophageal reflux disease)    Heart murmur    High cholesterol    History of stomach ulcers    Hypertension    Insomnia    Kidney stones    Migraine    "haven't had them in a good while; did get them 1-2 times/yr" (06/14/2014)   Myocardial infarction ([place]) 2008; ~ 2012   PAD (peripheral artery disease) ([place])    a. 01/2021 LE duplex: RCFA 30-49, R AT 30-49, LCFA 50-74, L Pop 50-74, L AT 50-74, L Peroneal 100.   Pneumonia    "get it 1-2 times/year" (06/14/2014)   RLS (restless legs syndrome)    Seizures (H[facility])    Sleep apnea    "they want me to do the lab but I haven't" (06/14/2014)   Stroke [facility])    "they say I've had several" (06/14/2014)   Tobacco abuse    Type II diabetes mellitus (H[facility])    Urinary, incontinence, stress female     Assessment: Pt is a 62 yo female presenting to ED c/o mid-sternal CP, radiating to neck & jaw, found with elevated Troponin I.  Goal of Therapy:  Heparin level 0.3-0.7 units/ml Monitor platelets by anticoagulation protocol: Yes   03/23 0509 HL 0.16,  subtherapeutic 3/23 1400 HL < 0.1 3/23 2200 HL < 0.1, subtherapeutic 3/24 0711 HL 0.55  Plan:  Heparin level is therapeutic. Will continue heparin infusion at 1200 units/hr. Recheck heparin level in 6 hours. CBC daily while on heparin. Plan for cath Monday.  Eleonore Chiquito, PharmD 07/13/2022 7:59 AM

## 2022-07-13 NOTE — Progress Notes (Signed)
[place] for heparin infusion Indication: ACS/STEMI  Allergies  Allergen Reactions   Buprenorphine Hcl     Nausea and vomiting   Carbamazepine Anaphylaxis and Other (See Comments)    Blood dyscrasia when on with Lithium   Lithium Other (See Comments)    Blood dyscrasia   Lodine [Etodolac] Shortness Of Breath, Diarrhea, Nausea And Vomiting and Rash   Methocarbamol Other (See Comments) and Shortness Of Breath    Unknown  Rash, tachypnea  Rash, tachypnea    Unknown Rash, tachypnea   Trazodone Other (See Comments)   Levofloxacin In D5w Swelling and Other (See Comments)    "Salty" sensation in mouth. "Hard time to explain it"   Tape Other (See Comments)    Welps, blister on skin   Wound Dressing Adhesive    Relafen [Nabumetone] Diarrhea, Nausea And Vomiting and Rash    Patient Measurements: Height: 5\' 2"  (157.5 cm) Weight: 57.5 kg (126 lb 12.2 oz) IBW/kg (Calculated) : 50.1 Heparin Dosing Weight: 57.5 kg  Vital Signs: Temp: 97.9 F (36.6 C) (03/24 0006) BP: 90/62 (03/24 0006) Pulse Rate: 75 (03/24 0006)  Labs: Recent Labs    07/11/22 2008 07/11/22 2204 07/11/22 2240 07/12/22 0509 07/12/22 0509 07/12/22 1252 07/12/22 1400 07/12/22 2200  HGB 14.4  --   --  12.4  --   --   --   --   HCT 43.6  --   --  38.1  --   --   --   --   PLT 333  --   --  264  --   --   --   --   APTT  --   --  27  --   --   --   --   --   LABPROT  --   --  12.7 13.3  --   --   --   --   INR  --   --  1.0 1.0  --   --   --   --   HEPARINUNFRC  --   --   --  0.16*   < > >1.10* <0.10* <0.10*  CREATININE 0.57  --   --  0.62  --   --   --   --   TROPONINIHS [id number]* 12,616*  --  22,293*  --  17,514*  --   --    < > = values in this interval not displayed.     Estimated Creatinine Clearance: 59.9 mL/min (by C-G formula based on SCr of 0.62 mg/dL).   Medical History: Past Medical History:  Diagnosis Date   Anxiety    Arthritis    "qwhere"  (06/14/2014)   Bipolar disorder ([place])    CAD (coronary artery disease)    a. 2008 DES->RCA; b. 2012 PCI: 8mRCA s/p DES; b. 05/2014 DES-> 90 dRCA; c. 11/16 PCI: dRCA ISR->DES;  d. 4/18 PCI: p-mRCA 95% s/p PCI/DES; e. 02/2017 Cath: patent dRCA stent, RPDA 90ost->Med Rx; f. 05/2017 PTCA mRCA 2/2 ISR; g. 05/2019 Cath: stable dzs; f. 08/2020 PCI: LM nl, LAD nl, D1 40, LCX 60p/m, OM1 50, RCA 20p/m, 121m/d (3.0x18 & 3.0x15 Resolute Onyx DESs), PDA 80, RPDA 80, RPAV 100. EF 50-55.   Chronic bronchitis ([place])    "get it q yr"   Claudication in peripheral vascular disease ([place])    Daily headache    "when my blood pressure is up" (06/14/2014)   Depression    Diastolic dysfunction  a. 02/2017 Echo: EF 55-60%, no rwma, Gr1 DD, nl RV fxn.   Diastolic dysfunction    a. 08/2020 Echo: EF 55-60%, mod LVH, grI DD. NL RV fxn.   GERD (gastroesophageal reflux disease)    Heart murmur    High cholesterol    History of stomach ulcers    Hypertension    Insomnia    Kidney stones    Migraine    "haven't had them in a good while; did get them 1-2 times/yr" (06/14/2014)   Myocardial infarction ([place]) 2008; ~ 2012   PAD (peripheral artery disease) ([place])    a. 01/2021 LE duplex: RCFA 30-49, R AT 30-49, LCFA 50-74, L Pop 50-74, L AT 50-74, L Peroneal 100.   Pneumonia    "get it 1-2 times/year" (06/14/2014)   RLS (restless legs syndrome)    Seizures (H[facility])    Sleep apnea    "they want me to do the lab but I haven't" (06/14/2014)   Stroke [facility])    "they say I've had several" (06/14/2014)   Tobacco abuse    Type II diabetes mellitus (H[facility])    Urinary, incontinence, stress female     Assessment: Pt is a 62 yo female presenting to ED c/o mid-sternal CP, radiating to neck & jaw, found with elevated Troponin I.  Goal of Therapy:  Heparin level 0.3-0.7 units/ml Monitor platelets by anticoagulation protocol: Yes   03/23 0509 HL 0.16, subtherapeutic 3/23 1400 HL < 0.1 3/23 2200 HL < 0.1, subtherapeutic  Plan:  Will  give heparin bolus of 2000 units x 1 Will increase the heparin infusion to 1200 units/hr Recheck heparin level in 6 hours.  CBC daily while on heparin.   Renda Rolls, PharmD, [facility] 07/13/2022 12:41 AM

## 2022-07-14 ENCOUNTER — Encounter: Payer: Self-pay | Admitting: Cardiovascular Disease

## 2022-07-14 ENCOUNTER — Encounter
Admission: EM | Disposition: A | Discharge status: Home / Self Care | Payer: Self-pay | Source: Home / Self Care | Attending: Internal Medicine

## 2022-07-14 DIAGNOSIS — I2510 Atherosclerotic heart disease of native coronary artery without angina pectoris: Secondary | ICD-10-CM

## 2022-07-14 DIAGNOSIS — I214 Non-ST elevation (NSTEMI) myocardial infarction: Secondary | ICD-10-CM | POA: Diagnosis not present

## 2022-07-14 HISTORY — PX: LEFT HEART CATH AND CORONARY ANGIOGRAPHY: CATH118249

## 2022-07-14 LAB — CBC
HCT: 35.7 % — ABNORMAL LOW (ref 36.0–46.0)
Hemoglobin: 11.8 g/dL — ABNORMAL LOW (ref 12.0–15.0)
MCH: 31.1 pg (ref 26.0–34.0)
MCHC: 33.1 g/dL (ref 30.0–36.0)
MCV: 93.9 fL (ref 80.0–100.0)
Platelets: 230 10*3/uL (ref 150–400)
RBC: 3.8 MIL/uL — ABNORMAL LOW (ref 3.87–5.11)
RDW: 12.9 % (ref 11.5–15.5)
WBC: 6.6 10*3/uL (ref 4.0–10.5)
nRBC: 0 % (ref 0.0–0.2)

## 2022-07-14 LAB — GLUCOSE, CAPILLARY
Glucose-Capillary: 189 mg/dL — ABNORMAL HIGH (ref 70–99)
Glucose-Capillary: 225 mg/dL — ABNORMAL HIGH (ref 70–99)

## 2022-07-14 LAB — HEPARIN LEVEL (UNFRACTIONATED): Heparin Unfractionated: 0.28 IU/mL — ABNORMAL LOW (ref 0.30–0.70)

## 2022-07-14 SURGERY — LEFT HEART CATH AND CORONARY ANGIOGRAPHY
Anesthesia: Moderate Sedation

## 2022-07-14 MED ORDER — SODIUM CHLORIDE 0.9% FLUSH: 3.0000 mL | Freq: Two times a day (BID) | INTRAVENOUS | Status: DC

## 2022-07-14 MED ORDER — SODIUM CHLORIDE 0.9% FLUSH: 3.0000 mL | INTRAVENOUS | Status: DC | PRN

## 2022-07-14 MED ORDER — SODIUM CHLORIDE 0.9 % IV SOLN: INTRAVENOUS | Status: DC

## 2022-07-14 MED ORDER — HEPARIN (PORCINE) IN NACL 1000-0.9 UT/500ML-% IV SOLN
INTRAVENOUS | Status: AC
  Filled 2022-07-14: qty 1000

## 2022-07-14 MED ORDER — ASPIRIN 81 MG PO CHEW: 81.0000 mg | CHEWABLE_TABLET | ORAL | Status: DC

## 2022-07-14 MED ORDER — IOHEXOL 300 MG/ML  SOLN
INTRAMUSCULAR | Status: DC | PRN
  Administered 2022-07-14: 45 mL

## 2022-07-14 MED ORDER — VERAPAMIL HCL 2.5 MG/ML IV SOLN
INTRAVENOUS | Status: AC
  Filled 2022-07-14: qty 2

## 2022-07-14 MED ORDER — FENTANYL CITRATE (PF) 100 MCG/2ML IJ SOLN
INTRAMUSCULAR | Status: DC | PRN
  Administered 2022-07-14: 50 ug via INTRAVENOUS
  Administered 2022-07-14 (×2): 25 ug via INTRAVENOUS

## 2022-07-14 MED ORDER — ASPIRIN 81 MG PO CHEW
81.0000 mg | CHEWABLE_TABLET | ORAL | Status: AC
  Administered 2022-07-14: 81 mg via ORAL
  Filled 2022-07-14: qty 1

## 2022-07-14 MED ORDER — SODIUM CHLORIDE 0.9 % IV SOLN: INTRAVENOUS | Status: AC

## 2022-07-14 MED ORDER — HEPARIN BOLUS VIA INFUSION
900.0000 [IU] | Freq: Once | INTRAVENOUS | Status: AC
  Administered 2022-07-14: 900 [IU] via INTRAVENOUS
  Filled 2022-07-14: qty 900

## 2022-07-14 MED ORDER — HEPARIN SODIUM (PORCINE) 1000 UNIT/ML IJ SOLN
INTRAMUSCULAR | Status: AC
  Filled 2022-07-14: qty 10

## 2022-07-14 MED ORDER — FENTANYL CITRATE (PF) 100 MCG/2ML IJ SOLN
INTRAMUSCULAR | Status: AC
  Filled 2022-07-14: qty 2

## 2022-07-14 MED ORDER — LOSARTAN POTASSIUM 25 MG PO TABS: 12.5000 mg | ORAL_TABLET | Freq: Every day | ORAL | 1 refills | Status: DC

## 2022-07-14 MED ORDER — SODIUM CHLORIDE 0.9 % IV SOLN: 250.0000 mL | INTRAVENOUS | Status: DC | PRN

## 2022-07-14 MED ORDER — MIDAZOLAM HCL 2 MG/2ML IJ SOLN
INTRAMUSCULAR | Status: DC | PRN
  Administered 2022-07-14 (×2): 1 mg via INTRAVENOUS

## 2022-07-14 MED ORDER — MIDAZOLAM HCL 2 MG/2ML IJ SOLN
INTRAMUSCULAR | Status: AC
  Filled 2022-07-14: qty 2

## 2022-07-14 MED ORDER — HEPARIN (PORCINE) IN NACL 1000-0.9 UT/500ML-% IV SOLN
INTRAVENOUS | Status: DC | PRN
  Administered 2022-07-14 (×2): 500 mL

## 2022-07-14 SURGICAL SUPPLY — 14 items
CANNULA 5F STIFF (CANNULA) IMPLANT
CATH INFINITI 5FR MULTPACK ANG (CATHETERS) IMPLANT
DEVICE CLOSURE MYNXGRIP 5F (Vascular Products) IMPLANT
DRAPE BRACHIAL (DRAPES) IMPLANT
GLIDESHEATH SLEND SS 6F .021 (SHEATH) IMPLANT
GUIDEWIRE INQWIRE 1.5J.035X260 (WIRE) IMPLANT
INQWIRE 1.5J .035X260CM (WIRE) ×1
KIT SYRINGE INJ CVI SPIKEX1 (MISCELLANEOUS) IMPLANT
PACK CARDIAC CATH (CUSTOM PROCEDURE TRAY) ×1 IMPLANT
PROTECTION STATION PRESSURIZED (MISCELLANEOUS) ×1
SET ATX-X65L (MISCELLANEOUS) IMPLANT
SHEATH AVANTI 5FR X 11CM (SHEATH) IMPLANT
STATION PROTECTION PRESSURIZED (MISCELLANEOUS) IMPLANT
WIRE GUIDERIGHT .035X150 (WIRE) IMPLANT

## 2022-07-14 NOTE — Discharge Instructions (Addendum)
Abstain from smoking, using street drugs!  Pt to f/u [facility] family medicine in 1-2 weeks

## 2022-07-14 NOTE — Discharge Summary (Signed)
Physician Discharge Summary   Patient: Jennifer [name] MRN: [id number] DOB: 02-27-1961  Admit date:     07/11/2022  Discharge date: 07/14/22  Discharge Physician: Fritzi Mandes   PCP: [place]   Recommendations at discharge:    F/u Dr Rockey Situ in 1-2 weeks  Discharge Diagnoses: Principal Problem:   NSTEMI (non-ST elevated myocardial infarction) [facility]) Active Problems:   Dyslipidemia   Essential hypertension   GERD without esophagitis   Anxiety and depression   Type 2 diabetes mellitus without complications ([place])   Smoker   Marijuana abuse   Jennifer [name] is a 62 y.o. Caucasian female with medical history significant for coronary artery disease status post PCI and stents, chronic bronchitis, osteoarthritis, anxiety, bipolar disorder, dyslipidemia, hypertension, migraine, type 2 diabetes mellitus, seizure disorder and OSA, who presented to the ER with acute onset of 8/10 pressure-like midsternal chest pain which started last night when she was smoking a dab of marijuana with her friends.    NSTEMI (non-ST elevated myocardial infarction) ([place]) in the setting of polysubstance drug abuse -  on IV heparin drip and titrate nitro gtt -- weaned down -  on aspirin as well as as needed sublingual nitroglycerin and IV morphine sulfate for pain. - high-dose statin therapy, beta-blocker therapy. - 2D echo Left Ventricle: Left ventricular ejection fraction, by estimation, is 40  to 45%. The left ventricle has mildly decreased function. The left  ventricle demonstrates regional wall motion abnormalities. The left  ventricular internal cavity size was normal  -- [facility] cardiology consult noted --Cath per Dr Fletcher Anon: Cardiac cath showed occluded distal RCA stents with well-developed left-to-right collaterals. Recommend medical therapy.  --pt denies CP. Vitals stable.   Dyslipidemia - continue statin therapy .   Essential hypertension - continue antihypertensives.   GERD without  esophagitis - continue PPI therapy.   Type 2 diabetes mellitus without complications ([place]) - on supplement coverage with NovoLog. - continue basal coverage. - continue Jardiance.   Anxiety and depression Bipolar DO -  continue his BuSpar, Klonopin prn and Lexapro.   Polysubstance abuse.  Patient advised cessation     DVT prophylaxis: IV heparin. Advanced Care Planning:  Code Status: full code.    D/c to home. Pt agreeable  Consults :[facility] CODE STATUS: full     Disposition: Home Diet recommendation:  Discharge Diet Orders (From admission, onward)     Start     Ordered   07/14/22 0000  Diet - low sodium heart healthy        07/14/22 1220           Cardiac and Carb modified diet DISCHARGE MEDICATION: Allergies as of 07/14/2022       Reactions   Buprenorphine Hcl    Nausea and vomiting   Carbamazepine Anaphylaxis, Other (See Comments)   Blood dyscrasia when on with Lithium   Lithium Other (See Comments)   Blood dyscrasia   Lodine [etodolac] Shortness Of Breath, Diarrhea, Nausea And Vomiting, Rash   Methocarbamol Other (See Comments), Shortness Of Breath   Unknown Rash, tachypnea Rash, tachypnea    Unknown Rash, tachypnea   Trazodone Other (See Comments)   Levofloxacin In D5w Swelling, Other (See Comments)   "Salty" sensation in mouth. "Hard time to explain it"   Tape Other (See Comments)   Welps, blister on skin   Wound Dressing Adhesive    Lyrica [pregabalin] Rash   Relafen [nabumetone] Diarrhea, Nausea And Vomiting, Rash  Medication List     STOP taking these medications    baclofen 10 MG tablet Commonly known as: LIORESAL   Basaglar KwikPen 100 UNIT/ML   doxycycline 100 MG capsule Commonly known as: MONODOX   furosemide 20 MG tablet Commonly known as: LASIX   lidocaine 5 % Commonly known as: Lidoderm   lisinopril 10 MG tablet Commonly known as: ZESTRIL   metoprolol succinate 50 MG 24 hr tablet Commonly known as:  TOPROL-XL   pantoprazole 40 MG tablet Commonly known as: PROTONIX   permethrin 5 % cream Commonly known as: ELIMITE   traMADol 50 MG tablet Commonly known as: ULTRAM   Unifine Pentips 31G X 5 MM Misc Generic drug: Insulin Pen Needle       TAKE these medications    albuterol 108 (90 Base) MCG/ACT inhaler Commonly known as: VENTOLIN HFA Inhale 2 puffs into the lungs every 4 (four) hours as needed for wheezing or shortness of breath.   aspirin EC 81 MG tablet Take 1 tablet (81 mg total) by mouth daily.   atorvastatin 80 MG tablet Commonly known as: LIPITOR Take 1 tablet (80 mg total) by mouth daily.   blood glucose meter kit and supplies Kit Dispense based on patient and insurance preference. Use up to four times daily as directed. (FOR ICD-9 250.00, 250.01).   Blood Glucose Monitoring Suppl w/Device Kit Test 1-2 times daily; E11.65 diagnosis   clonazePAM 0.5 MG tablet Commonly known as: KLONOPIN Take 0.5 mg by mouth 2 (two) times daily as needed for anxiety.   diclofenac Sodium 1 % Gel Commonly known as: Voltaren Arthritis Pain Apply 4 g topically 4 (four) times daily.   empagliflozin 25 MG Tabs tablet Commonly known as: Jardiance Take 1 tablet (25 mg total) by mouth daily.   gabapentin 600 MG tablet Commonly known as: NEURONTIN Take 1.5 mg by mouth 4 (four) times daily.   HYDROcodone-acetaminophen 5-325 MG tablet Commonly known as: NORCO/VICODIN Take 1 tablet by mouth every 6 (six) hours as needed.   ibuprofen 800 MG tablet Commonly known as: ADVIL Take 800 mg by mouth every 6 (six) hours as needed.   losartan 25 MG tablet Commonly known as: COZAAR Take 0.5 tablets (12.5 mg total) by mouth daily. Start taking on: July 15, 2022   nitroGLYCERIN 0.4 MG SL tablet Commonly known as: NITROSTAT Place 1 tablet (0.4 mg total) under the tongue every 5 (five) minutes as needed for chest pain.   nortriptyline 25 MG capsule Commonly known as: PAMELOR Take 25  mg by mouth at bedtime.   Praluent 150 MG/ML Soaj Generic drug: Alirocumab Inject 150 mg into the skin every 14 (fourteen) days.   QUEtiapine 200 MG tablet Commonly known as: SEROQUEL Take 1 tablet (200 mg total) by mouth at bedtime.   ranolazine 1000 MG SR tablet Commonly known as: Ranexa Take 1 tablet (1,000 mg total) by mouth 2 (two) times daily.   ticagrelor 60 MG Tabs tablet Commonly known as: Brilinta Take 1 tablet (60 mg total) by mouth 2 (two) times daily. Please call [phone] to schedule an appointment for further refills. Thank you.   tiZANidine 2 MG tablet Commonly known as: ZANAFLEX Take 2 mg by mouth every 8 (eight) hours as needed.   Trulicity [id number] Sopn Generic drug: Dulaglutide Inject 0.75 mg into the skin once a week.        Follow-up Information     Gollan, Kathlene November, MD. Schedule an appointment as soon as possible for  a visit in 1 week(s).   Specialty: Cardiology Why: NSTEMI Contact information: [place] [address] [phone]                 [place] [facility]   07/11/22 2002  Weight: 57.5 kg     Condition at discharge: fair  The results of significant diagnostics from this hospitalization (including imaging, microbiology, ancillary and laboratory) are listed below for reference.   Imaging Studies: CARDIAC CATHETERIZATION  Result Date: 07/14/2022   Prox Cx to Mid Cx lesion is 60% stenosed.   Prox RCA to Mid RCA lesion is 20% stenosed.   1st Diag lesion is 40% stenosed.   Ost RPDA to RPDA lesion is 50% stenosed.   RPDA lesion is 80% stenosed.   Ost 1st Mrg lesion is 70% stenosed.   Dist RCA lesion is 100% stenosed.   There is mild to moderate left ventricular systolic dysfunction.   LV end diastolic pressure is moderately elevated.   The left ventricular ejection fraction is 35-45% by visual estimate. 1.  Significant two-vessel coronary artery disease with occluded distal RCA stents likely due to  very late stent thrombosis.  Well-developed left to right collaterals from the LAD.  Borderline significant disease in OM1 and left circumflex but vessel diameter is 2 mm at most. 2.  Mildly to moderately reduced LV systolic function with an EF of 35 to 40%.  Moderately elevated left ventricular end-diastolic pressure. Recommendations: Recommend continuing aggressive medical therapy as the RCA is well collateralized had extensive stenting in the past.  The left circumflex is too small in diameter to stent.   ECHOCARDIOGRAM COMPLETE  Result Date: 07/12/2022    ECHOCARDIOGRAM REPORT   Patient Name:   Jennifer [name] Date of Exam: 07/12/2022 Medical Rec #:  [id number]      Height:       62.0 in Accession #:    [id number]     Weight:       126.8 lb Date of Birth:  01-09-1961     BSA:          1.575 m Patient Age:    9 years       BP:           148/96 mmHg Patient Gender: F              HR:           69 bpm. Exam Location:  [facility] Procedure: 2D Echo, Color Doppler and Cardiac Doppler Indications:     NSTEMI I21.4  History:         Patient has prior history of Echocardiogram examinations. CAD                  and Previous Myocardial Infarction, Stroke and Bronchitis; Risk                  Factors:Current Smoker, Diabetes, Hypertension and                  Dyslipidemia.  Sonographer:     L. Thornton-Maynard Referring Phys:  [id number] [place] Diagnosing Phys: [name] MD IMPRESSIONS  1. Left ventricular ejection fraction, by estimation, is 40 to 45%. The left ventricle has mildly decreased function. The left ventricle demonstrates regional wall motion abnormalities (severe inferior/posterior wallhypokinesis). There is moderate asymmetric left ventricular hypertrophy of the septal segment. Left ventricular diastolic parameters are consistent with Grade I diastolic dysfunction (impaired relaxation).  2.  Right ventricular systolic function is normal. The right ventricular size is normal. Tricuspid regurgitation signal  is inadequate for assessing PA pressure.  3. The mitral valve is normal in structure. Moderate mitral valve regurgitation. No evidence of mitral stenosis.  4. The aortic valve is normal in structure. Aortic valve regurgitation is mild. No aortic stenosis is present.  5. The inferior vena cava is normal in size with greater than 50% respiratory variability, suggesting right atrial pressure of 3 mmHg. FINDINGS  Left Ventricle: Left ventricular ejection fraction, by estimation, is 40 to 45%. The left ventricle has mildly decreased function. The left ventricle demonstrates regional wall motion abnormalities. The left ventricular internal cavity size was normal in size. There is moderate asymmetric left ventricular hypertrophy of the septal segment. Left ventricular diastolic parameters are consistent with Grade I diastolic dysfunction (impaired relaxation). Right Ventricle: The right ventricular size is normal. No increase in right ventricular wall thickness. Right ventricular systolic function is normal. Tricuspid regurgitation signal is inadequate for assessing PA pressure. Left Atrium: Left atrial size was normal in size. Right Atrium: Right atrial size was normal in size. Pericardium: There is no evidence of pericardial effusion. Mitral Valve: The mitral valve is normal in structure. Moderate mitral valve regurgitation. No evidence of mitral valve stenosis. MV peak gradient, 4.4 mmHg. The mean mitral valve gradient is 2.0 mmHg. Tricuspid Valve: The tricuspid valve is normal in structure. Tricuspid valve regurgitation is mild . No evidence of tricuspid stenosis. Aortic Valve: The aortic valve is normal in structure. Aortic valve regurgitation is mild. Aortic regurgitation PHT measures 383 msec. No aortic stenosis is present. Aortic valve mean gradient measures 4.5 mmHg. Aortic valve peak gradient measures 7.8 mmHg. Aortic valve area, by VTI measures 2.16 cm. Pulmonic Valve: The pulmonic valve was normal in  structure. Pulmonic valve regurgitation is not visualized. No evidence of pulmonic stenosis. Aorta: The aortic root is normal in size and structure. Venous: The inferior vena cava is normal in size with greater than 50% respiratory variability, suggesting right atrial pressure of 3 mmHg. IAS/Shunts: No atrial level shunt detected by color flow Doppler.  LEFT VENTRICLE PLAX 2D LVIDd:         5.30 cm      Diastology LVIDs:         4.20 cm      LV e' medial:    5.33 cm/s LV PW:         1.00 cm      LV E/e' medial:  11.8 LV IVS:        1.80 cm      LV e' lateral:   7.40 cm/s LVOT diam:     2.20 cm      LV E/e' lateral: 8.5 LV SV:         56 LV SV Index:   36 LVOT Area:     3.80 cm  LV Volumes (MOD) LV vol d, MOD A2C: 94.8 ml LV vol d, MOD A4C: 107.0 ml LV vol s, MOD A2C: 42.4 ml LV vol s, MOD A4C: 50.6 ml LV SV MOD A2C:     52.4 ml LV SV MOD A4C:     107.0 ml LV SV MOD BP:      55.7 ml RIGHT VENTRICLE             IVC RV Basal diam:  3.00 cm     IVC diam: 1.70 cm RV S prime:     14.60 cm/s TAPSE (M-mode): 2.3  cm LEFT ATRIUM             Index        RIGHT ATRIUM           Index LA diam:        2.90 cm 1.84 cm/m   RA Area:     11.00 cm LA Vol (A2C):   59.0 ml 37.46 ml/m  RA Volume:   24.40 ml  15.49 ml/m LA Vol (A4C):   39.3 ml 24.95 ml/m LA Biplane Vol: 48.0 ml 30.48 ml/m  AORTIC VALVE                    PULMONIC VALVE AV Area (Vmax):    1.97 cm     PV Vmax:       0.85 m/s AV Area (Vmean):   1.86 cm     PV Peak grad:  2.9 mmHg AV Area (VTI):     2.16 cm AV Vmax:           139.50 cm/s AV Vmean:          92.250 cm/s AV VTI:            0.261 m AV Peak Grad:      7.8 mmHg AV Mean Grad:      4.5 mmHg LVOT Vmax:         72.30 cm/s LVOT Vmean:        45.100 cm/s LVOT VTI:          0.148 m LVOT/AV VTI ratio: 0.57 AI PHT:            383 msec  AORTA Ao Root diam: 3.80 cm Ao Asc diam:  3.40 cm MITRAL VALVE MV Area (PHT): 3.91 cm       SHUNTS MV Area VTI:   2.08 cm       Systemic VTI:  0.15 m MV Peak grad:  4.4 mmHg        Systemic Diam: 2.20 cm MV Mean grad:  2.0 mmHg MV Vmax:       1.04 m/s MV Vmean:      65.4 cm/s MV Decel Time: 194 msec MR Peak grad:    140.2 mmHg MR Mean grad:    90.5 mmHg MR Vmax:         592.00 cm/s MR Vmean:        449.5 cm/s MR PISA:         2.26 cm MR PISA Eff ROA: 14 mm MR PISA Radius:  0.60 cm MV E velocity: 62.90 cm/s MV A velocity: 92.80 cm/s MV E/A ratio:  0.68 [name] MD Electronically signed by [name] MD Signature Date/Time: 07/12/2022/2:00:50 PM    Final    DG Chest 2 View  Result Date: 07/11/2022 CLINICAL DATA:  CP EXAM: CHEST - 2 VIEW COMPARISON:  09/04/2020 FINDINGS: Lungs are clear. Heart size and mediastinal contours are within normal limits. Coronary stent. No effusion.  No pneumothorax. Right shoulder DJD with orthopedic anchor. Vertebral endplate spurring at multiple levels in the mid thoracic spine. IMPRESSION: No acute cardiopulmonary disease. Electronically Signed   By: Lucrezia Europe M.D.   On: 07/11/2022 20:12    Microbiology: Results for orders placed or performed during the hospital encounter of 09/04/20  Resp Panel by RT-PCR (Flu A&B, Covid) Nasopharyngeal Swab     Status: None   Collection Time: 09/05/20 12:30 AM   Specimen: Nasopharyngeal Swab; Nasopharyngeal(NP) swabs in vial transport medium  Result Value Ref Range Status   SARS Coronavirus 2 by RT PCR NEGATIVE NEGATIVE Final    Comment: (NOTE) SARS-CoV-2 target nucleic acids are NOT DETECTED.  The SARS-CoV-2 RNA is generally detectable in upper respiratory specimens during the acute phase of infection. The lowest concentration of SARS-CoV-2 viral copies this assay can detect is 138 copies/mL. A negative result does not preclude SARS-Cov-2 infection and should not be used as the sole basis for treatment or other patient management decisions. A negative result may occur with  improper specimen collection/handling, submission of specimen other than nasopharyngeal swab, presence of viral mutation(s)  within the areas targeted by this assay, and inadequate number of viral copies(<138 copies/mL). A negative result must be combined with clinical observations, patient history, and epidemiological information. The expected result is Negative.  Fact Sheet for Patients:  EntrepreneurPulse.com.au  Fact Sheet for Healthcare Providers:  IncredibleEmployment.be  This test is no t yet approved or cleared by the [place] FDA and  has been authorized for detection and/or diagnosis of SARS-CoV-2 by FDA under an Emergency Use Authorization (EUA). This EUA will remain  in effect (meaning this test can be used) for the duration of the COVID-19 declaration under Section 564(b)(1) of the Act, 21 U.S.C.section 360bbb-3(b)(1), unless the authorization is terminated  or revoked sooner.       Influenza A by PCR NEGATIVE NEGATIVE Final   Influenza B by PCR NEGATIVE NEGATIVE Final    Comment: (NOTE) The Xpert Xpress SARS-CoV-2/FLU/RSV plus assay is intended as an aid in the diagnosis of influenza from Nasopharyngeal swab specimens and should not be used as a sole basis for treatment. Nasal washings and aspirates are unacceptable for Xpert Xpress SARS-CoV-2/FLU/RSV testing.  Fact Sheet for Patients: EntrepreneurPulse.com.au  Fact Sheet for Healthcare Providers: IncredibleEmployment.be  This test is not yet approved or cleared by the [place] FDA and has been authorized for detection and/or diagnosis of SARS-CoV-2 by FDA under an Emergency Use Authorization (EUA). This EUA will remain in effect (meaning this test can be used) for the duration of the COVID-19 declaration under Section 564(b)(1) of the Act, 21 U.S.C. section 360bbb-3(b)(1), unless the authorization is terminated or revoked.  Performed at [facility], [address] [place], [place] [address]   MRSA PCR Screening     Status: None    Collection Time: 09/05/20  5:55 AM   Specimen: Nasopharyngeal  Result Value Ref Range Status   MRSA by PCR NEGATIVE NEGATIVE Final    Comment:        The GeneXpert MRSA Assay (FDA approved for NASAL specimens only), is one component of a comprehensive MRSA colonization surveillance program. It is not intended to diagnose MRSA infection nor to guide or monitor treatment for MRSA infections. Performed at [facility], [address] [place], [place] [address]     Labs: CBC: Recent Labs  Lab 07/11/22 2008 07/12/22 0509 07/13/22 0711 07/14/22 0502  WBC 17.0* 10.2 8.7 6.6  HGB 14.4 12.4 11.8* 11.8*  HCT 43.6 38.1 36.1 35.7*  MCV 94.4 95.3 94.8 93.9  PLT 333 264 234 [id number]   Basic Metabolic Panel: Recent Labs  Lab 07/11/22 2008 07/12/22 0509  NA 140 139  K 3.8 3.6  CL 103 109  CO2 26 25  GLUCOSE 254* 167*  BUN 19 14  CREATININE 0.57 0.62  CALCIUM 9.6 8.4*   Liver Function Tests: No results for input(s): "AST", "ALT", "ALKPHOS", "BILITOT", "PROT", "ALBUMIN" in the last 168 hours. CBG:  Recent Labs  Lab 07/14/22 0736 07/14/22 1217  GLUCAP 189* 225*    Discharge time spent: greater than 30 minutes.  Signed: Fritzi Mandes, MD [facility] 07/14/2022

## 2022-07-14 NOTE — Progress Notes (Signed)
Cardiology Progress Note   Patient Name: Jennifer [name] Date of Encounter: 07/14/2022  Primary Cardiologist: [name], MD  Subjective   No chest pain or shortness of breath.  Cardiac catheterization was done this morning via the right femoral artery which showed occluded distal RCA stents with left-to-right collaterals which is the likely culprit for myocardial infarction.  In addition, there was borderline significant disease and a small left circumflex.  I recommended medical therapy.  Inpatient Medications    Scheduled Meds:  aspirin EC  81 mg Oral Daily   atorvastatin  80 mg Oral Daily   empagliflozin  25 mg Oral Daily   gabapentin  900 mg Oral TID WC & HS   losartan  12.5 mg Oral Daily   QUEtiapine  200 mg Oral QHS   ranolazine  1,000 mg Oral BID   sodium chloride flush  3 mL Intravenous Q12H   sodium chloride flush  3 mL Intravenous Q12H   ticagrelor  90 mg Oral BID   Continuous Infusions:  sodium chloride     sodium chloride     PRN Meds: sodium chloride, acetaminophen, albuterol, clonazePAM, HYDROcodone-acetaminophen, magnesium hydroxide, nitroGLYCERIN, ondansetron (ZOFRAN) IV, sodium chloride flush, tiZANidine   Vital Signs    Vitals:   07/14/22 0945 07/14/22 0947 07/14/22 1015 07/14/22 1215  BP: 100/77  118/85 (!) 93/54  Pulse: 64 62  72  Resp: 14 15 16 14   Temp:   97.6 F (36.4 C) 98.4 F (36.9 C)  TempSrc:   Oral Oral  SpO2: 91% 95% 96% 95%  Weight:      Height:        Intake/Output Summary (Last 24 hours) at 07/14/2022 1332 Last data filed at 07/14/2022 1000 Gross per 24 hour  Intake 260.2 ml  Output --  Net 260.2 ml    Filed Weights   07/11/22 2002  Weight: 57.5 kg    Physical Exam   GEN: Well nourished, well developed, in no acute distress.  HEENT: Grossly normal.  Neck: Supple, no JVD, carotid bruits, or masses. Cardiac: RRR, 2/6 syst murmur @ upper sternal borders, no rubs or gallops. No clubbing, cyanosis, edema.  Radials 2+,  DP/PT 1+ and equal bilaterally.  Respiratory:  Respirations regular and unlabored, clear to auscultation bilaterally. GI: Soft, nontender, nondistended, BS + x 4. MS: no deformity or atrophy. Skin: warm and dry, no rash. Neuro:  Strength and sensation are intact. Psych: AAOx3.  Normal affect. No groin hematoma.  Labs    Chemistry Recent Labs  Lab 07/11/22 2008 07/12/22 0509  NA 140 139  K 3.8 3.6  CL 103 109  CO2 26 25  GLUCOSE 254* 167*  BUN 19 14  CREATININE 0.57 0.62  CALCIUM 9.6 8.4*  GFRNONAA >60 >60  ANIONGAP 11 5      Hematology Recent Labs  Lab 07/12/22 0509 07/13/22 0711 07/14/22 0502  WBC 10.2 8.7 6.6  RBC 4.00 3.81* 3.80*  HGB 12.4 11.8* 11.8*  HCT 38.1 36.1 35.7*  MCV 95.3 94.8 93.9  MCH 31.0 31.0 31.1  MCHC 32.5 32.7 33.1  RDW 12.7 12.8 12.9  PLT 264 234 230     Cardiac Enzymes  Recent Labs  Lab 07/11/22 2008 07/11/22 2204 07/12/22 0509 07/12/22 1252  TROPONINIHS 9,887* 12,616* 22,293* 17,514*       Lipids  Lab Results  Component Value Date   CHOL 105 07/12/2022   HDL 47 07/12/2022   LDLCALC 39 07/12/2022   LDLDIRECT 183.5 (  H) 07/27/2020   TRIG 94 07/12/2022   CHOLHDL 2.2 07/12/2022    HbA1c  Lab Results  Component Value Date   HGBA1C 14.3 (H) 09/05/2020    Radiology    DG Chest 2 View  Result Date: 07/11/2022 CLINICAL DATA:  CP EXAM: CHEST - 2 VIEW COMPARISON:  09/04/2020 FINDINGS: Lungs are clear. Heart size and mediastinal contours are within normal limits. Coronary stent. No effusion.  No pneumothorax. Right shoulder DJD with orthopedic anchor. Vertebral endplate spurring at multiple levels in the mid thoracic spine. IMPRESSION: No acute cardiopulmonary disease. Electronically Signed   By: Lucrezia Europe M.D.   On: 07/11/2022 20:12    Telemetry    Predominantly sinus rhythm in the 70's.  At 7:25 am, she developed complete heart block w/ V rates in the 20's followed by 2:1 AVB.  Episode resolved by 7:26 am.  Sinus in the  70's-80's since - Personally Reviewed  Cardiac Studies   2D Echocardiogram 3.23.2024  1. Left ventricular ejection fraction, by estimation, is 40 to 45%. The  left ventricle has mildly decreased function. The left ventricle  demonstrates regional wall motion abnormalities (severe inferior/posterior  wallhypokinesis). There is moderate  asymmetric left ventricular hypertrophy of the septal segment. Left  ventricular diastolic parameters are consistent with Grade I diastolic  dysfunction (impaired relaxation).   2. Right ventricular systolic function is normal. The right ventricular  size is normal. Tricuspid regurgitation signal is inadequate for assessing  PA pressure.   3. The mitral valve is normal in structure. Moderate mitral valve  regurgitation. No evidence of mitral stenosis.   4. The aortic valve is normal in structure. Aortic valve regurgitation is  mild. No aortic stenosis is present.   5. The inferior vena cava is normal in size with greater than 50%  respiratory variability, suggesting right atrial pressure of 3 mmHg. _____________   Patient Profile     a 62 y.o. female with a history of CAD status post multiple right coronary artery interventions, hypertension, hyperlipidemia, depression, anxiety, bipolar disorder, diabetes, tobacco abuse, and PAD, who presented 3/22 w/ a 1 day h/o chest pain, n, v, and late presenting inferolateral STEMI.  HsTrop [id number]  [id number]  [id number].  Assessment & Plan    1.  Late-presenting inferolateral STEMI/CAD:   Cardiac catheterization done this morning showed occluded distal RCA stents with left-to-right collaterals.  This is the likely culprit.  Multiple layers of stents noted in the right coronary artery and I felt that the best option is to treat medically given well-developed collaterals and limited PCI options for the right coronary artery. There was borderline disease in the left circumflex but vessel diameter was 2 mm at  best. Continue dual antiplatelet therapy with aspirin and ticagrelor. I advised her to postpone any elective procedures or surgeries for at least 2 months.  2.  Ischemic cardiomyopathy:  EF 40-45% w/ severe inf/posterior HK on echo 3/23 - new findings.   Cont jardiance.   Lisinopril was changed to small dose losartan.  Toprol is currently on hold due to bradycardia episode of high-grade AV block.  In addition, the blood pressure is on the low side.  3.  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 Total duration of heart block ~ 1 min.  Will consider resuming beta-blocker in the outpatient setting.  4.  Essential HTN:  BPs soft.  Follow.  5.  DMII:  per IM.  6.  HL:  LDL of 39.  Cont high potency statin rx.  7.  Tob abuse:  smoking > 1ppd.  Smoking cessation advised.  8.  Marijuana/polysubstance abuse:  Admits to using marijuana and occasionally a very concentrated form.  Drug screen + for methamphetamines and TCA as well - she was not aware of this.  Discussed importance of illicit drug avoidance.  9.  PAD:  moderate bilateral lower ext PAD w/ L peroneal occlusion noted on duplex in 2022.  Chronic leg pain.  Medically managed.  Needs to quit smoking.  She can be discharged home from a cardiac standpoint on current medications.  Signed, Kathlyn Sacramento, MD  07/14/2022, 1:32 PM    For questions or updates, please contact   Please consult [email or web address] for contact info under Cardiology/STEMI.

## 2022-07-14 NOTE — Progress Notes (Signed)
Katie for heparin infusion Indication: ACS/STEMI  Allergies  Allergen Reactions   Buprenorphine Hcl     Nausea and vomiting   Carbamazepine Anaphylaxis and Other (See Comments)    Blood dyscrasia when on with Lithium   Lithium Other (See Comments)    Blood dyscrasia   Lodine [Etodolac] Shortness Of Breath, Diarrhea, Nausea And Vomiting and Rash   Methocarbamol Other (See Comments) and Shortness Of Breath    Unknown  Rash, tachypnea  Rash, tachypnea    Unknown Rash, tachypnea   Trazodone Other (See Comments)   Levofloxacin In D5w Swelling and Other (See Comments)    "Salty" sensation in mouth. "Hard time to explain it"   Tape Other (See Comments)    Welps, blister on skin   Wound Dressing Adhesive    Relafen [Nabumetone] Diarrhea, Nausea And Vomiting and Rash    Patient Measurements: Height: 5\' 2"  (157.5 cm) Weight: 57.5 kg (126 lb 12.2 oz) IBW/kg (Calculated) : 50.1 Heparin Dosing Weight: 57.5 kg  Vital Signs: Temp: 98.5 F (36.9 C) (03/24 2124) Temp Source: Oral (03/24 2124) BP: 96/60 (03/25 0007) Pulse Rate: 76 (03/25 0007)  Labs: Recent Labs    07/11/22 2008 07/11/22 2008 07/11/22 2204 07/11/22 2240 07/12/22 0509 07/12/22 1252 07/12/22 1400 07/13/22 0711 07/13/22 1355 07/14/22 0502  HGB 14.4  --   --   --  12.4  --   --  11.8*  --  11.8*  HCT 43.6  --   --   --  38.1  --   --  36.1  --  35.7*  PLT 333  --   --   --  264  --   --  234  --  230  APTT  --   --   --  27  --   --   --   --   --   --   LABPROT  --   --   --  12.7 13.3  --   --   --   --   --   INR  --   --   --  1.0 1.0  --   --   --   --   --   HEPARINUNFRC  --    < >  --   --  0.16* >1.10*   < > 0.55 0.47 0.28*  CREATININE 0.57  --   --   --  0.62  --   --   --   --   --   TROPONINIHS [id number]*  --  12,616*  --  22,293* 17,514*  --   --   --   --    < > = values in this interval not displayed.     Estimated Creatinine Clearance: 59.9 mL/min (by  C-G formula based on SCr of 0.62 mg/dL).   Medical History: Past Medical History:  Diagnosis Date   Anxiety    Arthritis    "qwhere" (06/14/2014)   Bipolar disorder ([place])    CAD (coronary artery disease)    a. 2008 DES->RCA; b. 2012 PCI: 19mRCA s/p DES; b. 05/2014 DES-> 90 dRCA; c. 11/16 PCI: dRCA ISR->DES;  d. 4/18 PCI: p-mRCA 95% s/p PCI/DES; e. 02/2017 Cath: patent dRCA stent, RPDA 90ost->Med Rx; f. 05/2017 PTCA mRCA 2/2 ISR; g. 05/2019 Cath: stable dzs; f. 08/2020 PCI: LM nl, LAD nl, D1 40, LCX 60p/m, OM1 50, RCA 20p/m, 166m/d (3.0x18 & 3.0x15 Resolute Onyx DESs), PDA 80,  RPDA 80, RPAV 100. EF 50-55.   Chronic bronchitis ([place])    "get it q yr"   Claudication in peripheral vascular disease ([place])    Daily headache    "when my blood pressure is up" (06/14/2014)   Depression    Diastolic dysfunction    a. 02/2017 Echo: EF 55-60%, no rwma, Gr1 DD, nl RV fxn.   Diastolic dysfunction    a. 08/2020 Echo: EF 55-60%, mod LVH, grI DD. NL RV fxn.   GERD (gastroesophageal reflux disease)    Heart murmur    High cholesterol    History of stomach ulcers    Hypertension    Insomnia    Kidney stones    Migraine    "haven't had them in a good while; did get them 1-2 times/yr" (06/14/2014)   Myocardial infarction ([place]) 2008; ~ 2012   PAD (peripheral artery disease) ([place])    a. 01/2021 LE duplex: RCFA 30-49, R AT 30-49, LCFA 50-74, L Pop 50-74, L AT 50-74, L Peroneal 100.   Pneumonia    "get it 1-2 times/year" (06/14/2014)   RLS (restless legs syndrome)    Seizures ([facility])    Sleep apnea    "they want me to do the lab but I haven't" (06/14/2014)   Stroke [facility])    "they say I've had several" (06/14/2014)   Tobacco abuse    Type II diabetes mellitus ([facility])    Urinary, incontinence, stress female     Assessment: Pt is a 62 yo female presenting to ED c/o mid-sternal CP, radiating to neck & jaw, found with elevated Troponin I. No chronic PTA AC noted from chart review.  Goal of Therapy:  Heparin  level 0.3-0.7 units/ml Monitor platelets by anticoagulation protocol: Yes   03/23 0509 HL 0.16, subtherapeutic 3/23 1400 HL < 0.1 3/23 2200 HL < 0.1, subtherapeutic 3/24 0711 HL 0.55 3/24 1441 HL 0.47 3/25 0502 HL 0.28, subtherapeutic  Plan: Bolus 900 units x 1 Increase heparin infusion to 1300 units. Recheck HL in 6 hr after rate change CBC daily while on heparin.  Plan for cath Monday, time not scheduled.  Renda Rolls, PharmD, [facility] 07/14/2022 6:09 AM

## 2022-07-14 NOTE — Care Management Important Message (Signed)
Important Message  Patient Details  Name: Jennifer [name] MRN: [id number] Date of Birth: Mar 12, 1961   Medicare Important Message Given:  Yes     Dannette Barbara 07/14/2022, 12:49 PM

## 2022-07-14 NOTE — Interval H&P Note (Signed)
History and Physical Interval Note:  07/14/2022 8:20 AM  Jennifer [name]  has presented today for surgery, with the diagnosis of acute coronary syndrome.  The various methods of treatment have been discussed with the patient and family. After consideration of risks, benefits and other options for treatment, the patient has consented to  Procedure(s): LEFT HEART CATH AND CORONARY ANGIOGRAPHY (N/A) as a surgical intervention.  The patient's history has been reviewed, patient examined, no change in status, stable for surgery.  I have reviewed the patient's chart and labs.  Questions were answered to the patient's satisfaction.     Kathlyn Sacramento

## 2022-07-15 ENCOUNTER — Ambulatory Visit: Payer: 59 | Admitting: Physician Assistant

## 2022-07-15 LAB — LIPOPROTEIN A (LPA): Lipoprotein (a): 233.1 nmol/L — ABNORMAL HIGH (ref <75.0)

## 2022-07-30 ENCOUNTER — Ambulatory Visit: Payer: 59 | Admitting: Nurse Practitioner

## 2022-08-01 ENCOUNTER — Telehealth: Payer: Self-pay | Admitting: Cardiovascular Disease

## 2022-08-01 NOTE — Telephone Encounter (Signed)
Pt c/o medication issue:  1. Name of Medication: Propranolol 10mg  2-3 times a day for severe anxiety and panic attacks   2. How are you currently taking this medication (dosage and times per day)? See above   3. Are you having a reaction (difficulty breathing--STAT)? No   4. What is your medication issue?  Pt states her psychiatrist prescribed her this med for anxiety and wants to know given her high bp issues, recent hospital stay, and other cardiac meds, she wants to make sure this is safe to take. Please advise.

## 2022-08-04 NOTE — Telephone Encounter (Signed)
Not clear why her metoprolol was stopped at discharge after recent NSTEMI a few weeks ago. Discharge summary mentions her being on a beta blocker but then they advised her to stop metoprolol on her discharge summary med list. Ideally needs longer acting beta blocker rather than prn propranolol - likely needs MD follow up.

## 2022-08-05 NOTE — Telephone Encounter (Signed)
I called and spoke with the patient. I advised her that her metoprolol was stopped at discharge in March 2024 CHB/ low HR's. I have also advised her of Pharm D feedback.  The patient is currently scheduled to see Cadence Sub[place], [place] 08/21/22.  I have advised her that we should move up her follow up appointment and check her EKG prior to advising on propranolol. The patient voices understanding and is agreeable with moving up her appointment to 4/23 with Cadence, PA at 10:05 am.  The patient was very appreciative of the call back.

## 2022-08-05 NOTE — Telephone Encounter (Signed)
Pt calling back for an update  

## 2022-08-12 ENCOUNTER — Telehealth: Payer: Self-pay | Admitting: Medical

## 2022-08-12 ENCOUNTER — Encounter: Payer: Self-pay | Admitting: Medical

## 2022-08-12 ENCOUNTER — Ambulatory Visit: Payer: 59

## 2022-08-12 ENCOUNTER — Other Ambulatory Visit
Admission: RE | Admit: 2022-08-12 | Discharge: 2022-08-12 | Disposition: A | Discharge status: Home / Self Care | Payer: 59 | Source: Ambulatory Visit | Attending: Medical | Admitting: Medical

## 2022-08-12 ENCOUNTER — Ambulatory Visit: Payer: 59 | Attending: Physician Assistant | Admitting: Medical

## 2022-08-12 VITALS — BP 124/82 | HR 86 | Ht 62.0 in | Wt 131.6 lb

## 2022-08-12 DIAGNOSIS — I25118 Atherosclerotic heart disease of native coronary artery with other forms of angina pectoris: Secondary | ICD-10-CM | POA: Diagnosis not present

## 2022-08-12 DIAGNOSIS — I459 Conduction disorder, unspecified: Secondary | ICD-10-CM | POA: Diagnosis present

## 2022-08-12 DIAGNOSIS — E782 Mixed hyperlipidemia: Secondary | ICD-10-CM

## 2022-08-12 DIAGNOSIS — I10 Essential (primary) hypertension: Secondary | ICD-10-CM | POA: Insufficient documentation

## 2022-08-12 DIAGNOSIS — I255 Ischemic cardiomyopathy: Secondary | ICD-10-CM

## 2022-08-12 LAB — BASIC METABOLIC PANEL
Anion gap: 6 (ref 5–15)
BUN: 21 mg/dL — ABNORMAL HIGH (ref 6–20)
CO2: 22 mmol/L (ref 22–32)
Calcium: 9 mg/dL (ref 8.9–10.3)
Chloride: 111 mmol/L (ref 98–111)
Creatinine, Ser: 0.7 mg/dL (ref 0.44–1.00)
GFR, Estimated: >60 mL/min (ref >60)
Glucose, Bld: 160 mg/dL — ABNORMAL HIGH (ref 70–99)
Potassium: 4 mmol/L (ref 3.5–5.1)
Sodium: 139 mmol/L (ref 135–145)

## 2022-08-12 LAB — CBC
HCT: 39.4 % (ref 36.0–46.0)
Hemoglobin: 12.7 g/dL (ref 12.0–15.0)
MCH: 31 pg (ref 26.0–34.0)
MCHC: 32.2 g/dL (ref 30.0–36.0)
MCV: 96.1 fL (ref 80.0–100.0)
Platelets: 258 10*3/uL (ref 150–400)
RBC: 4.1 MIL/uL (ref 3.87–5.11)
RDW: 14.6 % (ref 11.5–15.5)
WBC: 9 10*3/uL (ref 4.0–10.5)
nRBC: 0 % (ref 0.0–0.2)

## 2022-08-12 MED ORDER — SACUBITRIL-VALSARTAN 24-26 MG PO TABS: 1.0000 | ORAL_TABLET | Freq: Two times a day (BID) | ORAL | 3 refills | Status: DC

## 2022-08-12 NOTE — Patient Instructions (Signed)
Medication Instructions:  STOP Losartan START Entresto 24/26 twice daily   *If you need a refill on your cardiac medications before your next appointment, please call your pharmacy*   Lab Work: CBC, BMET today  BMET in 2 weeks   If you have labs (blood work) drawn today and your tests are completely normal, you will receive your results only by: MyChart Message (if you have MyChart) OR A paper copy in the mail If you have any lab test that is abnormal or we need to change your treatment, we will call you to review the results.   Testing/Procedures:  ZIO AT Long term monitor-Live Telemetry  Your physician has requested you wear a ZIO patch monitor for 14 days.  This is a single patch monitor. [employer] supplies one patch monitor per enrollment. Additional  stickers are not available.  Please do not apply patch if you will be having a Nuclear Stress Test, Echocardiogram, Cardiac CT, MRI,  or Chest Xray during the period you would be wearing the monitor. The patch cannot be worn during  these tests. You cannot remove and re-apply the ZIO AT patch monitor.  Your ZIO patch monitor will be mailed 3 day USPS to your address on file. It may take 3-5 days to  receive your monitor after you have been enrolled.  Once you have received your monitor, please review the enclosed instructions. Your monitor has  already been registered assigning a specific monitor serial # to you.   Billing and Patient Assistance Program information  Meredeth Ide has been supplied with any insurance information on record for billing. [employer] offers a sliding scale Patient Assistance Program for patients without insurance, or whose  insurance does not completely cover the cost of the ZIO patch monitor. You must apply for the  Patient Assistance Program to qualify for the discounted rate. To apply, call [employer] at [phone],  select option 4, select option 2 , ask to apply for the Patient Assistance Program, (you can  request an  interpreter if needed). [employer] will ask your household income and how many people are in your  household. [employer] will quote your out-of-pocket cost based on this information. They will also be able  to set up a 12 month interest free payment plan if needed.  Applying the monitor   Shave hair from upper left chest.  Hold the abrader disc by orange tab. Rub the abrader in 40 strokes over left upper chest as indicated in  your monitor instructions.  Clean area with 4 enclosed alcohol pads. Use all pads to ensure the area is cleaned thoroughly. Let  dry.  Apply patch as indicated in monitor instructions. Patch will be placed under collarbone on left side of  chest with arrow pointing upward.  Rub patch adhesive wings for 2 minutes. Remove the white label marked "1". Remove the white label  marked "2". Rub patch adhesive wings for 2 additional minutes.  While looking in a mirror, press and release button in center of patch. A small green light will flash 3-4  times. This will be your only indicator that the monitor has been turned on.  Do not shower for the first 24 hours. You may shower after the first 24 hours.  Press the button if you feel a symptom. You will hear a small click. Record Date, Time and Symptom in  the Patient Log.   Starting the Gateway  In your kit there is a Audiological scientist box the size of a cellphone.  This is Buyer, retail. It transmits all your  recorded data to [facility]. This box must always stay within 10 feet of you. Open the box and push the *  button. There will be a light that blinks orange and then green a few times. When the light stops  blinking, the Gateway is connected to the ZIO patch. Call [employer] at [phone] to confirm your monitor is transmitting.  Returning your monitor  Remove your patch and place it inside the Gateway. In the lower half of the Gateway there is a white  bag with prepaid postage on it. Place Gateway in bag and seal. Mail  package back to [place] as soon as  possible. Your physician should have your final report approximately 7 days after you have mailed back  your monitor. Call [facility] Customer Care at [phone] if you have questions regarding your ZIO AT  patch monitor. Call them immediately if you see an orange light blinking on your monitor.  If your monitor falls off in less than 4 days, contact our Monitor department at [phone]. If your  monitor becomes loose or falls off after 4 days call [employer] at [phone] for suggestions on  securing your monitor    Follow-Up: At [facility], you and your health needs are our priority.  As part of our continuing mission to provide you with exceptional heart care, we have created designated Provider Care Teams.  These Care Teams include your primary Cardiologist (physician) and Advanced Practice Providers (APPs -  Physician Assistants and Nurse Practitioners) who all work together to provide you with the care you need, when you need it.  We recommend signing up for the patient portal called "MyChart".  Sign up information is provided on this After Visit Summary.  MyChart is used to connect with patients for Virtual Visits (Telemedicine).  Patients are able to view lab/test results, encounter notes, upcoming appointments, etc.  Non-urgent messages can be sent to your provider as well.   To learn more about what you can do with MyChart, go to [email or web address].    Your next appointment:   4-6 week(s)  Provider:   Terrilee Croak, PA-C

## 2022-08-12 NOTE — Telephone Encounter (Signed)
Pt was in the office today and forgot to mention her needing a note stating she's not supposed to have beta blocker meds. She's requesting a callback. Please advise.

## 2022-08-12 NOTE — Progress Notes (Addendum)
Cardiology Office Note:    Date:  08/12/2022   ID:  Jennifer [name], DOB September 25, 1960, MRN [id number]  PCP:  [facility]  [facility] Cardiologist:  Julien Nordmann, MD  [facility] Electrophysiologist:  None   Referring MD: [facility]   Chief Complaint: Hospital follow-up  History of Present Illness:    Jennifer [name] is a 62 y.o. female with a hx of CAD status post multiple right coronary artery interventions, hypertension, hyperlipidemia, depression, anxiety, bipolar disorder, diabetes, tobacco abuse and PAD who is being seen for hospital follow-up.  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  19 Aug 2020 showed an EF of 55 to 60% with moderate LVH and grade 1 diastolic dysfunction.  Diagnostic catheterization performed in the setting of recurrent chest pain in May 2022 showed an occluded distal RCA with thrombus into the small RPL branches.  The wise, she had nonobstructive disease which was similar to catheterization in December 2021.  EF was 50 to 55%.  The mid to distal RCA was treated with 2 overlapping drug-eluting stents, with recommendation for prolonged dual antiplatelet therapy.  LE arterial duplex in October 2022 showed mild bilateral LE PAD, which has been medically managed.    Patient was admitted April 2022 for chest pain after smoking a condensed version a week.  High-sensitivity troponin elevated to greater than 2000.  EKG with new subtle inferior and anterolateral ST elevation with associated T wave inversion and Qs.  Cardiac cath showed occluded distal RCA stents with well-developed left-to-right collaterals.  Recommended medical therapy.  Today, the patient is doing well since being back home. She [place] supposed to take losartan 12.5, but takes 25 because she forgets to split it. BP is better when she takes a  whole one. She is having occasional chest pain. They are brief episodes, feels more like panic attacks. Groin site Is good. Rare breathing issues, she just had her inhaler called in. No lower leg edema. She is taking Aspirin and Brilinta. She did not take ASA for 3 days. No bleeding issues.   Past Medical History:  Diagnosis Date   Anxiety    Arthritis    "qwhere" (06/14/2014)   Bipolar disorder    CAD (coronary artery disease)    a. 2008 DES->RCA; b. 2012 PCI: s/p DES; b. 05/2014 DES-> 90 dRCA; c. 11/16 PCI: dRCA ISR->DES;  d. 4/18 PCI: p-mRCA 95% s/p PCI/DES; e. 02/2017 Cath: patent dRCA stent, RPDA 90ost->Med Rx; f. 05/2017 PTCA mRCA 2/2 ISR; g. 05/2019 Cath: stable dzs; f. 08/2020 PCI: LM nl, LAD nl, D1 40, LCX 60p/m, OM1 50, RCA 20p/m, 181m/d (3.0x18 & 3.0x15 Resolute Onyx DESs), PDA 80, RPDA 80, RPAV 100. EF 50-55.   Chronic bronchitis    "get it q yr"   Claudication in peripheral vascular disease    Daily headache    "when my blood pressure is up" (06/14/2014)   Depression    Diastolic dysfunction    a. 02/2017 Echo: EF 55-60%, no rwma, Gr1 DD, nl RV fxn.   Diastolic dysfunction    a. 08/2020 Echo: EF 55-60%, mod LVH, grI DD. NL RV fxn.   GERD (gastroesophageal reflux disease)    Heart murmur    High cholesterol    History of stomach ulcers    Hypertension    Insomnia    Kidney  stones    Migraine    "haven't had them in a good while; did get them 1-2 times/yr" (06/14/2014)   Myocardial infarction 2008; ~ 2012   PAD (peripheral artery disease)    a. 01/2021 LE duplex: RCFA 30-49, R AT 30-49, LCFA 50-74, L Pop 50-74, L AT 50-74, L Peroneal 100.   Pneumonia    "get it 1-2 times/year" (06/14/2014)   RLS (restless legs syndrome)    Seizures    Sleep apnea    "they want me to do the lab but I haven't" (06/14/2014)   Stroke    "they say I've had several" (06/14/2014)   Tobacco abuse    Type II diabetes mellitus    Urinary, incontinence, stress female     Past Surgical  History:  Procedure Laterality Date   ABDOMINAL HYSTERECTOMY  1984   "I've got 1 ovary left"   CARDIAC CATHETERIZATION N/A 02/26/2015   Procedure: Left Heart Cath and Coronary Angiography;  Surgeon: Iran Ouch, MD;  Location: [facility];  Service: Cardiovascular;  Laterality: N/A;   CARDIAC CATHETERIZATION N/A 02/26/2015   Procedure: Coronary Stent Intervention;  Surgeon: Iran Ouch, MD;  Location: [facility];  Service: Cardiovascular;  Laterality: N/A;   CESAREAN SECTION  1984   CORONARY ANGIOPLASTY WITH STENT PLACEMENT  2008; ~ 2012   "1; 1"   CORONARY BALLOON ANGIOPLASTY N/A 06/09/2017   Procedure: CORONARY BALLOON ANGIOPLASTY;  Surgeon: Yvonne Kendall, MD;  Location: [facility];  Service: Cardiovascular;  Laterality: N/A;   CORONARY STENT INTERVENTION N/A 08/04/2016   Procedure: Coronary Stent Intervention;  Surgeon: Iran Ouch, MD;  Location: [facility];  Service: Cardiovascular;  Laterality: N/A;   CORONARY/GRAFT ACUTE MI REVASCULARIZATION N/A 09/05/2020   Procedure: Coronary/Graft Acute MI Revascularization;  Surgeon: Yvonne Kendall, MD;  Location: [facility];  Service: Cardiovascular;  Laterality: N/A;   DILATION AND CURETTAGE OF UTERUS     EXTRACORPOREAL SHOCK WAVE LITHOTRIPSY  X 2   I & D EXTREMITY Left 02/09/2018   Procedure: IRRIGATION AND DEBRIDEMENT EXTREMITY;  Surgeon: Carolan Shiver, MD;  Location: [facility];  Service: General;  Laterality: Left;   KNEE ARTHROSCOPY Left    LEFT HEART CATH Bilateral 08/04/2016   Procedure: Left Heart Cath poss PCI;  Surgeon: Iran Ouch, MD;  Location: [facility];  Service: Cardiovascular;  Laterality: Bilateral;   LEFT HEART CATH AND CORONARY ANGIOGRAPHY Left 02/19/2017   Procedure: LEFT HEART CATH AND CORONARY ANGIOGRAPHY;  Surgeon: Antonieta Iba, MD;  Location: [facility];  Service: Cardiovascular;  Laterality: Left;   LEFT HEART CATH AND  CORONARY ANGIOGRAPHY Left 06/09/2017   Procedure: LEFT HEART CATH AND CORONARY ANGIOGRAPHY;  Surgeon: Yvonne Kendall, MD;  Location: [facility];  Service: Cardiovascular;  Laterality: Left;   LEFT HEART CATH AND CORONARY ANGIOGRAPHY N/A 04/10/2020   Procedure: LEFT HEART CATH AND CORONARY ANGIOGRAPHY;  Surgeon: Iran Ouch, MD;  Location: [facility];  Service: Cardiovascular;  Laterality: N/A;   LEFT HEART CATH AND CORONARY ANGIOGRAPHY N/A 09/05/2020   Procedure: LEFT HEART CATH AND CORONARY ANGIOGRAPHY;  Surgeon: Yvonne Kendall, MD;  Location: [facility];  Service: Cardiovascular;  Laterality: N/A;   LEFT HEART CATH AND CORONARY ANGIOGRAPHY N/A 07/14/2022   Procedure: LEFT HEART CATH AND CORONARY ANGIOGRAPHY;  Surgeon: Iran Ouch, MD;  Location: [facility];  Service: Cardiovascular;  Laterality: N/A;  LEFT HEART CATHETERIZATION WITH CORONARY ANGIOGRAM N/A 06/15/2014   Procedure: LEFT HEART CATHETERIZATION WITH CORONARY ANGIOGRAM;  Surgeon: Corky Crafts, MD;  Location[facility] CATH LAB;  Service: Cardiovascular;  Laterality: N/A;   PERCUTANEOUS CORONARY STENT INTERVENTION (PCI-S)  06/15/2014   Procedure: PERCUTANEOUS CORONARY STENT INTERVENTION (PCI-S);  Surgeon: Corky Crafts, MD;  Location: [facility] CATH LAB;  Service: Cardiovascular;;   TONSILLECTOMY     TUBAL LIGATION      Current Medications: Current Meds  Medication Sig   albuterol (PROVENTIL HFA;VENTOLIN HFA) 108 (90 Base) MCG/ACT inhaler Inhale 2 puffs into the lungs every 4 (four) hours as needed for wheezing or shortness of breath.   Alirocumab (PRALUENT) 150 MG/ML SOAJ Inject 150 mg into the skin every 14 (fourteen) days.   aspirin 81 MG EC tablet Take 1 tablet (81 mg total) by mouth daily.   atorvastatin (LIPITOR) 80 MG tablet Take 1 tablet (80 mg total) by mouth daily.   blood glucose meter kit and supplies KIT Dispense based on patient and insurance preference. Use up to four  times daily as directed. (FOR ICD-9 250.00, 250.01).   Blood Glucose Monitoring Suppl W/DEVICE KIT Test 1-2 times daily; E11.65 diagnosis   diclofenac Sodium (VOLTAREN ARTHRITIS PAIN) 1 % GEL Apply 4 g topically 4 (four) times daily.   empagliflozin (JARDIANCE) 25 MG TABS tablet Take 1 tablet (25 mg total) by mouth daily.   gabapentin (NEURONTIN) 600 MG tablet Take 1.5 mg by mouth 4 (four) times daily.   HYDROcodone-acetaminophen (NORCO/VICODIN) 5-325 MG tablet Take 1 tablet by mouth every 6 (six) hours as needed.   ibuprofen (ADVIL) 800 MG tablet Take 800 mg by mouth every 6 (six) hours as needed.   nitroGLYCERIN (NITROSTAT) 0.4 MG SL tablet Place 1 tablet (0.4 mg total) under the tongue every 5 (five) minutes as needed for chest pain.   QUEtiapine (SEROQUEL) 200 MG tablet Take 1 tablet (200 mg total) by mouth at bedtime.   ranolazine (RANEXA) 1000 MG SR tablet Take 1 tablet (1,000 mg total) by mouth 2 (two) times daily.   sacubitril-valsartan (ENTRESTO) 24-26 MG Take 1 tablet by mouth 2 (two) times daily.   ticagrelor (BRILINTA) 60 MG TABS tablet Take 1 tablet (60 mg total) by mouth 2 (two) times daily. Please call [phone] to schedule an appointment for further refills. Thank you.   tiZANidine (ZANAFLEX) 2 MG tablet Take 2 mg by mouth every 8 (eight) hours as needed.   TRULICITY 0.75 MG/0.5ML SOPN Inject 0.75 mg into the skin once a week.   [DISCONTINUED] clonazePAM (KLONOPIN) 0.5 MG tablet Take 0.5 mg by mouth 2 (two) times daily as needed for anxiety.   [DISCONTINUED] losartan (COZAAR) 25 MG tablet Take 0.5 tablets (12.5 mg total) by mouth daily.   [DISCONTINUED] nortriptyline (PAMELOR) 25 MG capsule Take 25 mg by mouth at bedtime.     Allergies:   Buprenorphine hcl, Carbamazepine, Lithium, Lodine [etodolac], Methocarbamol, Trazodone, Levofloxacin in d5w, Tape, Wound dressing adhesive, Lyrica [pregabalin], and Relafen [nabumetone]   Social History   Socioeconomic History   Marital  status: Legally Separated    Spouse name: Not on file   Number of children: Not on file   Years of education: Not on file   Highest education level: Not on file  Occupational History   Not on file  Tobacco Use   Smoking status: Every Day    Packs/day: 1.00    Years: 35.00    Additional pack years: 0.00  Total pack years: 35.00    Types: Cigarettes   Smokeless tobacco: Never  Vaping Use   Vaping Use: Never used  Substance and Sexual Activity   Alcohol use: Yes    Alcohol/week: 0.0 standard drinks of alcohol    Comment: occ   Drug use: Yes    Types: Marijuana    Comment: used about 3 days ago   Sexual activity: Not on file  Other Topics Concern   Not on file  Social History Narrative   Not on file   Social Determinants of Health   Financial Resource Strain: Not on file  Food Insecurity: Not on file  Transportation Needs: Not on file  Physical Activity: Not on file  Stress: Not on file  Social Connections: Not on file     Family History: The patient's family history includes Cirrhosis in her father; Lung cancer in her mother.  ROS:   Please see the history of present illness.     All other systems reviewed and are negative.  EKGs/Labs/Other Studies Reviewed:    The following studies were reviewed today:  Cardiac cath 06/2022   Prox Cx to Mid Cx lesion is 60% stenosed.   Prox RCA to Mid RCA lesion is 20% stenosed.   1st Diag lesion is 40% stenosed.   Ost RPDA to RPDA lesion is 50% stenosed.   RPDA lesion is 80% stenosed.   Ost 1st Mrg lesion is 70% stenosed.   Dist RCA lesion is 100% stenosed.   There is mild to moderate left ventricular systolic dysfunction.   LV end diastolic pressure is moderately elevated.   The left ventricular ejection fraction is 35-45% by visual estimate.   1.  Significant two-vessel coronary artery disease with occluded distal RCA stents likely due to very late stent thrombosis.  Well-developed left to right collaterals from the  LAD.  Borderline significant disease in OM1 and left circumflex but vessel diameter is 2 mm at most. 2.  Mildly to moderately reduced LV systolic function with an EF of 35 to 40%.  Moderately elevated left ventricular end-diastolic pressure.   Recommendations: Recommend continuing aggressive medical therapy as the RCA is well collateralized had extensive stenting in the past.  The left circumflex is too small in diameter to stent.  Coronary Diagrams  Diagnostic Dominance: Right  Intervention   EKG:  EKG is ordered today.  The ekg ordered today demonstrates NSR 86bpm, TWI III and aVF  Recent Labs: 08/12/2022: BUN 21; Creatinine, Ser 0.70; Hemoglobin 12.7; Platelets 258; Potassium 4.0; Sodium 139  Recent Lipid Panel    Component Value Date/Time   CHOL 105 07/12/2022 0509   CHOL 170 12/09/2019 1528   TRIG 94 07/12/2022 0509   HDL 47 07/12/2022 0509   HDL 38 (L) 12/09/2019 1528   CHOLHDL 2.2 07/12/2022 0509   VLDL 19 07/12/2022 0509   LDLCALC 39 07/12/2022 0509   LDLCALC 103 (H) 12/09/2019 1528   LDLDIRECT 183.5 (H) 07/27/2020 1601     Physical Exam:    VS:  BP 124/82 (BP Location: Left Arm, Patient Position: Sitting, Cuff Size: Normal)   Pulse 86   Ht 5\' 2"  (1.575 m)   Wt 131 lb 9.6 oz (59.7 kg)   SpO2 97%   BMI 24.07 kg/m     Wt Readings from Last 3 Encounters:  08/12/22 131 lb 9.6 oz (59.7 kg)  07/11/22 126 lb 12.2 oz (57.5 kg)  06/09/22 126 lb 8 oz (57.4 kg)    GEN:  Well nourished, well developed in no acute distress HEENT: Normal NECK: No JVD; No carotid bruits LYMPHATICS: No lymphadenopathy CARDIAC: RRR, no murmurs, rubs, gallops RESPIRATORY:  Clear to auscultation without rales, wheezing or rhonchi  ABDOMEN: Soft, non-tender, non-distended MUSCULOSKELETAL:  No edema; No deformity  SKIN: Warm and dry NEUROLOGIC:  Alert and oriented x 3 PSYCHIATRIC:  Normal affect   ASSESSMENT:    1. Coronary artery disease involving native coronary artery of native  heart with other form of angina pectoris   2. Ischemic cardiomyopathy   3. Heart block   4. Essential hypertension   5. Hyperlipidemia, mixed    PLAN:    In order of problems listed above:  ACS/late-presenting inferolateral STEMI CAD s/p multiple coronary artery interventions in the setting of recurrent ISR  Recent admission for non-STEMI.  Patient underwent cardiac cath showed occluded distal RCA stents with left-to-right collaterals which was likely the culprit for myocardial infarction.  It also showed borderline significant disease in the left small circumflex.  Medical therapy was recommended.  Right groin site is stable.  Patient is taking DAPT with aspirin and Brilinta.  She denies any bleeding issues.  I will check a CBC today.  Patient reports persistent daily chest pain that may be related to anxiety.  No active chest pain today.  She used sublingual nitro in the past that did not significantly help the pain.  Patient is on Ranexa 1000 mg twice daily.  Continue aspirin 81 mg daily, Brilinta 60 mg twice daily, Lipitor 80 mg daily, Praluent.  Beta-blocker discontinued for high-grade heart block/CHB during hospitalization. Patient is following with psych for anxiety issues, suspect chest pain has anxiety component.  If blood pressure is good at follow-up, may try Imdur.  ICM EF 40 to 45%.  Patient is euvolemic on exam.  Patient is taking Jardiance 10 mg daily and losartan 25 mg daily.  Will stop losartan and start Entresto 24-26 twice daily.  BMET in 2 weeks. Continue GDMT as able. No BB for CHB/high grade HB as above.  CHB/ 2:1 AVB 1 episode of complete heart block seen during admission and Toprol was stopped.  Patient was previously on Toprol 50 mg daily.  I will order a 2-week live heart monitor to evaluate heart rate and rhythm.  Patient may be able to tolerate low-dose beta-blocker in the future.  Psychiatrist is also wanting a beta-blocker addition for additional anxiety treatment with  Propranolol.  HTN Blood pressure is good today 124/82.  Stop losartan start Entresto as above.  HLD LDL 39.  Continue Praluent and Lipitor 80 mg daily.  Marijuana Smoking marijuana occasionally  Disposition: Follow up in 4-6 week(s) with MD/APP     Signed, Amarrah Meinhart David Stall, PA-C  08/12/2022 1:06 PM    [facility]

## 2022-08-13 NOTE — Addendum Note (Signed)
Addended by: Parke Poisson on: 08/13/2022 11:41 AM   Modules accepted: Orders

## 2022-08-18 ENCOUNTER — Encounter: Payer: Self-pay | Admitting: *Deleted

## 2022-08-18 NOTE — Telephone Encounter (Signed)
Letter has been completed and placed up front for pick up.

## 2022-08-18 NOTE — Telephone Encounter (Signed)
Spoke with patient and reviewed provider approval on letter to be done. Advised that I would get that done and place up front for her to pick up. She verbalized understanding with no further questions at this time.     Marianne Sofia, PA-C  Sent: Mon August 18, 2022  8:14 AM   Message  Patient had brief episode of CHB in the hospital on toprol. We are getting a heart monitor to see if this continues and she may tolerate low dose BB in the future. We can provide a note saying to avoid BB due h/o CHB in the hospital.   Inquired if she has placed monitor and she states she will do that today. No further needs at this time.

## 2022-08-21 ENCOUNTER — Ambulatory Visit: Payer: 59 | Admitting: Medical

## 2022-09-29 ENCOUNTER — Ambulatory Visit: Payer: 59 | Attending: Medical | Admitting: Medical

## 2022-09-29 NOTE — Progress Notes (Deleted)
Cardiology Office Note:    Date:  09/29/2022   ID:  ERIYAN REIGNER, DOB Nov 07, 1960, MRN [id number]  PCP:  [facility]  [facility] Cardiologist:  Julien Nordmann, MD  [facility] Electrophysiologist:  None   Referring MD: [facility]   Chief Complaint: ***  History of Present Illness:    Jennifer [name] is a 62 y.o. female with a hx of CAD status post multiple right coronary artery interventions, hypertension, hyperlipidemia, depression, anxiety, bipolar disorder, diabetes, tobacco abuse and PAD who is being seen for hospital follow-up.   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  19 Aug 2020 showed an EF of 55 to 60% with moderate LVH and grade 1 diastolic dysfunction.  Diagnostic catheterization performed in the setting of recurrent chest pain in May 2022 showed an occluded distal RCA with thrombus into the small RPL branches.  The wise, she had nonobstructive disease which was similar to catheterization in December 2021.  EF was 50 to 55%.  The mid to distal RCA was treated with 2 overlapping drug-eluting stents, with recommendation for prolonged dual antiplatelet therapy.  LE arterial duplex in October 2022 showed mild bilateral LE PAD, which has been medically managed.     Patient was admitted April 2022 for chest pain after smoking a condensed version a week.  High-sensitivity troponin elevated to greater than 2000.  EKG with new subtle inferior and anterolateral ST elevation with associated T wave inversion and Qs.  Cardiac cath showed occluded distal RCA stents with well-developed left-to-right collaterals. Medical therapy was recommended.  1 episode of complete heart block was seen during admission and Toprol was stopped.   She was seen in April 2024 for hospital follow-up.  She was overall doing well from a cardiac  perspective.  She reported minimal chest pain, possibly due to anxiety.  Patient was started on Entresto.  2-week heart monitor was ordered.  Today,  Heart monitor  Past Medical History:  Diagnosis Date   Anxiety    Arthritis    "qwhere" (06/14/2014)   Bipolar disorder (HCC)    CAD (coronary artery disease)    a. 2008 DES->RCA; b. 2012 PCI: s/p DES; b. 05/2014 DES-> 90 dRCA; c. 11/16 PCI: dRCA ISR->DES;  d. 4/18 PCI: p-mRCA 95% s/p PCI/DES; e. 02/2017 Cath: patent dRCA stent, RPDA 90ost->Med Rx; f. 05/2017 PTCA mRCA 2/2 ISR; g. 05/2019 Cath: stable dzs; f. 08/2020 PCI: LM nl, LAD nl, D1 40, LCX 60p/m, OM1 50, RCA 20p/m, 125m/d (3.0x18 & 3.0x15 Resolute Onyx DESs), PDA 80, RPDA 80, RPAV 100. EF 50-55.   Chronic bronchitis (HCC)    "get it q yr"   Claudication in peripheral vascular disease (HCC)    Daily headache    "when my blood pressure is up" (06/14/2014)   Depression    Diastolic dysfunction    a. 02/2017 Echo: EF 55-60%, no rwma, Gr1 DD, nl RV fxn.   Diastolic dysfunction    a. 08/2020 Echo: EF 55-60%, mod LVH, grI DD. NL RV fxn.   GERD (gastroesophageal reflux disease)    Heart murmur    High cholesterol    History of stomach ulcers    Hypertension    Insomnia    Kidney stones    Migraine    "haven't had them in a good while; did get them 1-2 times/yr" (  06/14/2014)   Myocardial infarction (HCC) 2008; ~ 2012   PAD (peripheral artery disease) (HCC)    a. 01/2021 LE duplex: RCFA 30-49, R AT 30-49, LCFA 50-74, L Pop 50-74, L AT 50-74, L Peroneal 100.   Pneumonia    "get it 1-2 times/year" (06/14/2014)   RLS (restless legs syndrome)    Seizures (HCC)    Sleep apnea    "they want me to do the lab but I haven't" (06/14/2014)   Stroke [facility])    "they say I've had several" (06/14/2014)   Tobacco abuse    Type II diabetes mellitus (HCC)    Urinary, incontinence, stress female     Past Surgical History:  Procedure Laterality Date   ABDOMINAL HYSTERECTOMY  1984   "I've got 1  ovary left"   CARDIAC CATHETERIZATION N/A 02/26/2015   Procedure: Left Heart Cath and Coronary Angiography;  Surgeon: Iran Ouch, MD;  Location: [facility] INVASIVE CV LAB;  Service: Cardiovascular;  Laterality: N/A;   CARDIAC CATHETERIZATION N/A 02/26/2015   Procedure: Coronary Stent Intervention;  Surgeon: Iran Ouch, MD;  Location: [facility] INVASIVE CV LAB;  Service: Cardiovascular;  Laterality: N/A;   CESAREAN SECTION  1984   CORONARY ANGIOPLASTY WITH STENT PLACEMENT  2008; ~ 2012   "1; 1"   CORONARY BALLOON ANGIOPLASTY N/A 06/09/2017   Procedure: CORONARY BALLOON ANGIOPLASTY;  Surgeon: Yvonne Kendall, MD;  Location: [facility] INVASIVE CV LAB;  Service: Cardiovascular;  Laterality: N/A;   CORONARY STENT INTERVENTION N/A 08/04/2016   Procedure: Coronary Stent Intervention;  Surgeon: Iran Ouch, MD;  Location: [facility] INVASIVE CV LAB;  Service: Cardiovascular;  Laterality: N/A;   CORONARY/GRAFT ACUTE MI REVASCULARIZATION N/A 09/05/2020   Procedure: Coronary/Graft Acute MI Revascularization;  Surgeon: Yvonne Kendall, MD;  Location: [facility] INVASIVE CV LAB;  Service: Cardiovascular;  Laterality: N/A;   DILATION AND CURETTAGE OF UTERUS     EXTRACORPOREAL SHOCK WAVE LITHOTRIPSY  X 2   I & D EXTREMITY Left 02/09/2018   Procedure: IRRIGATION AND DEBRIDEMENT EXTREMITY;  Surgeon: Carolan Shiver, MD;  Location: [facility] ORS;  Service: General;  Laterality: Left;   KNEE ARTHROSCOPY Left    LEFT HEART CATH Bilateral 08/04/2016   Procedure: Left Heart Cath poss PCI;  Surgeon: Iran Ouch, MD;  Location: [facility] INVASIVE CV LAB;  Service: Cardiovascular;  Laterality: Bilateral;   LEFT HEART CATH AND CORONARY ANGIOGRAPHY Left 02/19/2017   Procedure: LEFT HEART CATH AND CORONARY ANGIOGRAPHY;  Surgeon: Antonieta Iba, MD;  Location: [facility] CV LAB;  Service: Cardiovascular;  Laterality: Left;   LEFT HEART CATH AND CORONARY ANGIOGRAPHY Left 06/09/2017   Procedure: LEFT HEART CATH AND CORONARY  ANGIOGRAPHY;  Surgeon: Yvonne Kendall, MD;  Location: [facility] CV [facility];  Service: Cardiovascular;  Laterality: Left;   LEFT HEART CATH AND CORONARY ANGIOGRAPHY N/A 04/10/2020   Procedure: LEFT HEART CATH AND CORONARY ANGIOGRAPHY;  Surgeon: Iran Ouch, MD;  Location: [facility] CV [facility];  Service: Cardiovascular;  Laterality: N/A;   LEFT HEART CATH AND CORONARY ANGIOGRAPHY N/A 09/05/2020   Procedure: LEFT HEART CATH AND CORONARY ANGIOGRAPHY;  Surgeon: Yvonne Kendall, MD;  Location: [facility] CV [facility];  Service: Cardiovascular;  Laterality: N/A;   LEFT HEART CATH AND CORONARY ANGIOGRAPHY N/A 07/14/2022   Procedure: LEFT HEART CATH AND CORONARY ANGIOGRAPHY;  Surgeon: Iran Ouch, MD;  Location: [facility] CV [facility];  Service: Cardiovascular;  Laterality: N/A;   LEFT HEART CATHETERIZATION WITH CORONARY ANGIOGRAM N/A 06/15/2014   Procedure: LEFT HEART  CATHETERIZATION WITH CORONARY ANGIOGRAM;  Surgeon: Corky Crafts, MD;  Location: [facility] CATH LAB;  Service: Cardiovascular;  Laterality: N/A;   PERCUTANEOUS CORONARY STENT INTERVENTION (PCI-S)  06/15/2014   Procedure: PERCUTANEOUS CORONARY STENT INTERVENTION (PCI-S);  Surgeon: Corky Crafts, MD;  Location: [facility] CATH LAB;  Service: Cardiovascular;;   TONSILLECTOMY     TUBAL LIGATION      Current Medications: No outpatient medications have been marked as taking for the 09/29/22 encounter (Appointment) with Fransico Michael, Tionna Gigante H, PA-C.     Allergies:   Buprenorphine hcl, Carbamazepine, Lithium, Lodine [etodolac], Methocarbamol, Trazodone, Levofloxacin in d5w, Tape, Wound dressing adhesive, Lyrica [pregabalin], and Relafen [nabumetone]   Social History   Socioeconomic History   Marital status: Legally Separated    Spouse name: Not on file   Number of children: Not on file   Years of education: Not on file   Highest education level: Not on file  Occupational History   Not on file  Tobacco Use   Smoking status: Every Day     Packs/day: 1.00    Years: 35.00    Additional pack years: 0.00    Total pack years: 35.00    Types: Cigarettes   Smokeless tobacco: Never  Vaping Use   Vaping Use: Never used  Substance and Sexual Activity   Alcohol use: Yes    Alcohol/week: 0.0 standard drinks of alcohol    Comment: occ   Drug use: Yes    Types: Marijuana    Comment: used about 3 days ago   Sexual activity: Not on file  Other Topics Concern   Not on file  Social History Narrative   Not on file   Social Determinants of Health   Financial Resource Strain: Not on file  Food Insecurity: Not on file  Transportation Needs: Not on file  Physical Activity: Not on file  Stress: Not on file  Social Connections: Not on file     Family History: The patient's ***family history includes Cirrhosis in her father; Lung cancer in her mother.  ROS:   Please see the history of present illness.    *** All other systems reviewed and are negative.  EKGs/Labs/Other Studies Reviewed:    The following studies were reviewed today: ***  EKG:  EKG is *** ordered today.  The ekg ordered today demonstrates ***  Recent Labs: 08/12/2022: BUN 21; Creatinine, Ser 0.70; Hemoglobin 12.7; Platelets 258; Potassium 4.0; Sodium 139  Recent Lipid Panel    Component Value Date/Time   CHOL 105 07/12/2022 0509   CHOL 170 12/09/2019 1528   TRIG 94 07/12/2022 0509   HDL 47 07/12/2022 0509   HDL 38 (L) 12/09/2019 1528   CHOLHDL 2.2 07/12/2022 0509   VLDL 19 07/12/2022 0509   LDLCALC 39 07/12/2022 0509   LDLCALC 103 (H) 12/09/2019 1528   LDLDIRECT 183.5 (H) 07/27/2020 1601     Risk Assessment/Calculations:   {Does this patient have ATRIAL FIBRILLATION?:[phone]}   Physical Exam:    VS:  There were no vitals taken for this visit.    Wt Readings from Last 3 Encounters:  08/12/22 131 lb 9.6 oz (59.7 kg)  07/11/22 126 lb 12.2 oz (57.5 kg)  06/09/22 126 lb 8 oz (57.4 kg)     GEN: *** Well nourished, well developed in no  acute distress HEENT: Normal NECK: No JVD; No carotid bruits LYMPHATICS: No lymphadenopathy CARDIAC: ***RRR, no murmurs, rubs, gallops RESPIRATORY:  Clear to auscultation without rales, wheezing or rhonchi  ABDOMEN:  Soft, non-tender, non-distended MUSCULOSKELETAL:  No edema; No deformity  SKIN: Warm and dry NEUROLOGIC:  Alert and oriented x 3 PSYCHIATRIC:  Normal affect   ASSESSMENT:    No diagnosis found. PLAN:    In order of problems listed above:  ***  Disposition: Follow up {follow up:15908} with ***   Shared Decision Making/Informed Consent   {Are you ordering a CV Procedure (e.g. stress test, cath, DCCV, TEE, etc)?   Press F2        :[id number]}    Signed, Kindall Swaby David Stall, PA-C  09/29/2022 9:08 AM    [facility]

## 2022-10-02 ENCOUNTER — Encounter: Payer: Self-pay | Admitting: Medical

## 2022-11-14 ENCOUNTER — Encounter: Payer: Self-pay | Admitting: Emergency Medicine

## 2023-01-04 ENCOUNTER — Other Ambulatory Visit: Payer: Self-pay | Admitting: Medical

## 2023-01-05 NOTE — Telephone Encounter (Signed)
Last visit on 08/12/22 with plan to follow up 4-6 weeks.  Patient has cx and NS f/u appts.  Please schedule f/u.

## 2023-01-06 NOTE — Telephone Encounter (Signed)
Pt scheduled on 10/29

## 2023-01-29 ENCOUNTER — Other Ambulatory Visit: Payer: Self-pay

## 2023-01-29 DIAGNOSIS — J441 Chronic obstructive pulmonary disease with (acute) exacerbation: Secondary | ICD-10-CM | POA: Diagnosis not present

## 2023-01-29 DIAGNOSIS — F17210 Nicotine dependence, cigarettes, uncomplicated: Secondary | ICD-10-CM | POA: Diagnosis not present

## 2023-01-29 DIAGNOSIS — M5432 Sciatica, left side: Secondary | ICD-10-CM | POA: Insufficient documentation

## 2023-01-29 DIAGNOSIS — Z1152 Encounter for screening for COVID-19: Secondary | ICD-10-CM | POA: Insufficient documentation

## 2023-01-29 DIAGNOSIS — I2510 Atherosclerotic heart disease of native coronary artery without angina pectoris: Secondary | ICD-10-CM | POA: Insufficient documentation

## 2023-01-29 DIAGNOSIS — E1165 Type 2 diabetes mellitus with hyperglycemia: Secondary | ICD-10-CM | POA: Diagnosis not present

## 2023-01-29 DIAGNOSIS — I10 Essential (primary) hypertension: Secondary | ICD-10-CM | POA: Diagnosis not present

## 2023-01-29 DIAGNOSIS — R059 Cough, unspecified: Secondary | ICD-10-CM | POA: Diagnosis present

## 2023-01-29 NOTE — ED Triage Notes (Signed)
Pt to ED via POV c/o right sided flank pain and cold symptoms. Pain for a couple of weeks but got worse yesterday. Pt also complaining of headache, cough, sore throat for 2 days. Denies fever, CP, SOB, dizziness. Hx of kidney stones

## 2023-01-30 ENCOUNTER — Emergency Department: Payer: 59

## 2023-01-30 ENCOUNTER — Emergency Department
Admission: EM | Admit: 2023-01-30 | Discharge: 2023-01-30 | Disposition: A | Discharge status: Home / Self Care | Payer: 59 | Attending: Emergency Medicine | Admitting: Emergency Medicine

## 2023-01-30 DIAGNOSIS — J441 Chronic obstructive pulmonary disease with (acute) exacerbation: Secondary | ICD-10-CM

## 2023-01-30 DIAGNOSIS — M5432 Sciatica, left side: Secondary | ICD-10-CM

## 2023-01-30 LAB — CBC
HCT: 40 % (ref 36.0–46.0)
Hemoglobin: 13.2 g/dL (ref 12.0–15.0)
MCH: 32.1 pg (ref 26.0–34.0)
MCHC: 33 g/dL (ref 30.0–36.0)
MCV: 97.3 fL (ref 80.0–100.0)
Platelets: 233 10*3/uL (ref 150–400)
RBC: 4.11 MIL/uL (ref 3.87–5.11)
RDW: 14.4 % (ref 11.5–15.5)
WBC: 6.7 10*3/uL (ref 4.0–10.5)
nRBC: 0 % (ref 0.0–0.2)

## 2023-01-30 LAB — URINALYSIS, ROUTINE W REFLEX MICROSCOPIC
Bacteria, UA: NONE SEEN
Bilirubin Urine: NEGATIVE
Glucose, UA: >=500 mg/dL — AB
Hgb urine dipstick: NEGATIVE
Ketones, ur: NEGATIVE mg/dL
Leukocytes,Ua: NEGATIVE
Nitrite: NEGATIVE
Protein, ur: NEGATIVE mg/dL
Specific Gravity, Urine: 1.022 (ref 1.005–1.030)
pH: 6 (ref 5.0–8.0)

## 2023-01-30 LAB — RESP PANEL BY RT-PCR (RSV, FLU A&B, COVID)  RVPGX2
Influenza A by PCR: NEGATIVE
Influenza B by PCR: NEGATIVE
Resp Syncytial Virus by PCR: NEGATIVE
SARS Coronavirus 2 by RT PCR: NEGATIVE

## 2023-01-30 LAB — BASIC METABOLIC PANEL
Anion gap: 13 (ref 5–15)
BUN: 19 mg/dL (ref 6–20)
CO2: 20 mmol/L — ABNORMAL LOW (ref 22–32)
Calcium: 9 mg/dL (ref 8.9–10.3)
Chloride: 104 mmol/L (ref 98–111)
Creatinine, Ser: 0.75 mg/dL (ref 0.44–1.00)
GFR, Estimated: >60 mL/min (ref >60)
Glucose, Bld: 361 mg/dL — ABNORMAL HIGH (ref 70–99)
Potassium: 3.9 mmol/L (ref 3.5–5.1)
Sodium: 137 mmol/L (ref 135–145)

## 2023-01-30 LAB — GROUP A STREP BY PCR: Group A Strep by PCR: NOT DETECTED

## 2023-01-30 MED ORDER — INSULIN ASPART 100 UNIT/ML IJ SOLN: 10.0000 [IU] | Freq: Once | INTRAMUSCULAR | Status: DC

## 2023-01-30 MED ORDER — PREDNISONE 50 MG PO TABS: 50.0000 mg | ORAL_TABLET | Freq: Every day | ORAL | 0 refills | Status: AC

## 2023-01-30 MED ORDER — KETOROLAC TROMETHAMINE 30 MG/ML IJ SOLN
30.0000 mg | Freq: Once | INTRAMUSCULAR | Status: AC
  Administered 2023-01-30: 30 mg via INTRAMUSCULAR
  Filled 2023-01-30: qty 1

## 2023-01-30 MED ORDER — ONDANSETRON 4 MG PO TBDP
4.0000 mg | ORAL_TABLET | Freq: Once | ORAL | Status: AC
  Administered 2023-01-30: 4 mg via ORAL
  Filled 2023-01-30: qty 1

## 2023-01-30 MED ORDER — IPRATROPIUM-ALBUTEROL 0.5-2.5 (3) MG/3ML IN SOLN
6.0000 mL | Freq: Once | RESPIRATORY_TRACT | Status: AC
  Administered 2023-01-30: 6 mL via RESPIRATORY_TRACT
  Filled 2023-01-30: qty 6

## 2023-01-30 MED ORDER — PREDNISONE 20 MG PO TABS
60.0000 mg | ORAL_TABLET | Freq: Once | ORAL | Status: AC
  Administered 2023-01-30: 60 mg via ORAL
  Filled 2023-01-30: qty 3

## 2023-01-30 NOTE — ED Provider Notes (Signed)
[facility] Provider Note    Event Date/Time   First MD Initiated Contact with Patient 01/30/23 0143     (approximate)   History   Cough and Flank Pain   HPI  Jennifer [name] is a 62 y.o. female who presents to the ED for evaluation of Cough and Flank Pain   Reviewed PCP visit from May.  History of HTN, DM, chronic low back pain, CAD.  Continued smoker, COPD.  History of ureteral stones  Patient presents for evaluation of various complaints.  Namely a "cold" with increased cough without increased sputum production, chest pain or fever.  Also reports chronic left-sided sciatica that feels more severe without trauma, falls or injuries.  No saddle anesthesias or toileting changes.   Physical Exam   Triage Vital Signs: ED Triage Vitals [01/29/23 2347]  Encounter Vitals Group     BP (!) 157/94     Systolic BP Percentile      Diastolic BP Percentile      Pulse Rate 92     Resp 18     Temp 98.3 F (36.8 C)     Temp Source Oral     SpO2 100 %     Weight      Height      Head Circumference      Peak Flow      Pain Score      Pain Loc      Pain Education      Exclude from Growth Chart     Most recent vital signs: Vitals:   01/29/23 2347  BP: (!) 157/94  Pulse: 92  Resp: 18  Temp: 98.3 F (36.8 C)  SpO2: 100%    General: Awake, no distress.  CV:  Good peripheral perfusion.  Resp:  Normal effort.  Wheezing throughout without distress Abd:  No distention.  MSK:  No deformity noted.  Neuro:  No focal deficits appreciated. Other:     ED Results / Procedures / Treatments   Labs (all labs ordered are listed, but only abnormal results are displayed) Labs Reviewed  BASIC METABOLIC PANEL - Abnormal; Notable for the following components:      Result Value   CO2 20 (*)    Glucose, Bld 361 (*)    All other components within normal limits  URINALYSIS, ROUTINE W REFLEX MICROSCOPIC - Abnormal; Notable for the following components:   Color,  Urine STRAW (*)    APPearance CLEAR (*)    Glucose, UA >=500 (*)    All other components within normal limits  RESP PANEL BY RT-PCR (RSV, FLU A&B, COVID)  [id number]  GROUP A STREP BY PCR  CBC    EKG   RADIOLOGY CXR interpreted by me without evidence of acute cardiopulmonary pathology. KUB interpreted by me with nonobstructive bowel gas pattern and no clear ureteral stones  Official radiology report(s): DG Abdomen 1 View  Result Date: 01/30/2023 CLINICAL DATA:  eval ureteral stone. Right sided pain EXAM: ABDOMEN - 1 VIEW COMPARISON:  X-ray abdomen 05/25/2015 FINDINGS: Limited evaluation due to overlapping osseous structures and overlying soft tissues. The bowel gas pattern is normal. Phleboliths noted overlying the pelvis. No radio-opaque calculi or other significant radiographic abnormality are seen. Multilevel degenerative changes of the spine most prominent at the L3-L4 level. IMPRESSION: Limited evaluation due to overlapping osseous structures and overlying soft tissues. Nonobstructive bowel gas pattern. Electronically Signed   By: Tish Frederickson M.D.   On: 01/30/2023 03:32  DG Chest 2 View  Result Date: 01/30/2023 CLINICAL DATA:  productive cough, eval pna c/o right sided flank pain and cold symptoms. Pain for a couple of weeks but got worse yesterday. Pt also complaining of headache, cough, sore throat for 2 days. Hx of kidney stone EXAM: CHEST - 2 VIEW COMPARISON:  Chest x-ray 07/11/2022 FINDINGS: The heart and mediastinal contours are within normal limits. Aortic calcification. No focal consolidation. No pulmonary edema. No pleural effusion. No pneumothorax. No acute osseous abnormality. Right shoulder rotator cuff anchor suture. IMPRESSION: 1. No active cardiopulmonary disease. 2.  Aortic Atherosclerosis (ICD10-I70.0). Electronically Signed   By: Tish Frederickson M.D.   On: 01/30/2023 03:19    PROCEDURES and INTERVENTIONS:  .1-3 Lead EKG Interpretation  Performed by: Delton Prairie, MD Authorized by: Delton Prairie, MD     Interpretation: normal     ECG rate:  90   ECG rate assessment: normal     Rhythm: sinus rhythm     Ectopy: none     Conduction: normal     Medications  insulin aspart (novoLOG) injection 10 Units (has no administration in time range)  predniSONE (DELTASONE) tablet 60 mg (60 mg Oral Given 01/30/23 0233)  ipratropium-albuterol (DUONEB) 0.5-2.5 (3) MG/3ML nebulizer solution 6 mL (6 mLs Nebulization Given 01/30/23 0233)  ondansetron (ZOFRAN-ODT) disintegrating tablet 4 mg (4 mg Oral Given 01/30/23 0323)  ketorolac (TORADOL) 30 MG/ML injection 30 mg (30 mg Intramuscular Given 01/30/23 0323)     IMPRESSION / MDM / ASSESSMENT AND PLAN / ED COURSE  I reviewed the triage vital signs and the nursing notes.  Differential diagnosis includes, but is not limited to, COPD exacerbation, pneumonia, pneumothorax, viral URI  {Patient presents with symptoms of an acute illness or injury that is potentially life-threatening.  Patient presents with evidence of chronic sciatica and a COPD exacerbation suitable for outpatient management with a steroid burst.  Hyperglycemia is noted but no acidosis or signs of DKA.  Normal CBC, UA, strep and viral swabs.  Discharged with steroids.  Discussed return precautions and watching her sugars at home.  Clinical Course as of 01/30/23 0341  Fri Jan 30, 2023  1610 Reassessed, feeling better and is appreciative, sitting up in bed and eating a sandwich.  Discussed workup, steroids and return precautions.  She is requesting a prescription for Zofran. [DS]    Clinical Course User Index [DS] Delton Prairie, MD     FINAL CLINICAL IMPRESSION(S) / ED DIAGNOSES   Final diagnoses:  COPD exacerbation (HCC)  Sciatica of left side     Rx / DC Orders   ED Discharge Orders          Ordered    predniSONE (DELTASONE) 50 MG tablet  Daily        01/30/23 0208             Note:  This document was prepared using Dragon  voice recognition software and may include unintentional dictation errors.   Delton Prairie, MD 01/30/23 [phone]

## 2023-01-30 NOTE — ED Notes (Signed)
 Provided pt with discharge instructions and education. All of pt questions answered. Pt in possession of all belongings. Pt AAOX4 and stable at time of discharge.Pt ambulated w/ steady gait towards ED exit.

## 2023-02-02 ENCOUNTER — Other Ambulatory Visit: Payer: Self-pay | Admitting: Cardiovascular Disease

## 2023-02-16 NOTE — Progress Notes (Deleted)
Date:  02/16/2023   ID:  Jennifer [name], DOB 1961-02-07, MRN [id number]  Patient Location:  [address] [place] [address]   Provider location:   [facility], [place] office  PCP:  [facility]  Cardiologist:  Julien Nordmann, MD   No chief complaint on file.    History of Present Illness:    Jennifer [name] is a 62 y.o. female   past medical history of Poorly controlled diabetes type 2, hemoglobin A1c >13 Active smoker coronary artery disease, stent to the mid RCA 2008, repeat stenting to the mid RCA 2012 ,   catheterization February 2016 showing severe distal RCA disease estimated at 90%, patent mid RCA stents, also with 70% mid to distal circumflex disease of a small vessel,  Catheterization April 2018 stent to the right Catheterization November 2018 medical management recommended presenting for routine follow-up of her coronary artery disease  Last seen by myself in clinic February 2024 Seen by one of our providers April 2024     Needs nerve block for pain clinic/sciatica Needs to come off brilinta  Teeth removed, had significant bleeding as she did not stop her Brilinta Some chest pain, feels it is from anxiety Lots of stress at home, taking care of her grandchild who is 48 years old, living in a trailer with others  Taking lasix prn for ankle swelling Denies significant leg swelling  Still smoking 1/2 ppd CT scan: "pneumonia", finishing ABX Needs nerve block, needs to come off Brilinta  Not taking praluent, needs a new prescription On gabapentin, nortriptyline No desire to eat, losing weight Did not take BP meds today   chronic leg pain Blood pressure elevated today, reports she did not take her medications today  EKG personally reviewed by myself on todays visit Normal sinus rhythm rate 98 bpm no significant ST-T wave changes  Prior records /procedures reviewed Cardiac catheterization May 2022 Stent placed to distal  RCA  Severe single-vessel coronary artery disease with thrombotic 100% occlusion at the proximal margin of distal RCA stent with thrombus embolization into small RPL branches. Mild proximal/mid RCA in-stent restenosis and stable sequential 50-80% RPDA lesions. Nonobstructive CAD involving the left coronary artery, similar to prior catheterization in 03/2020. Low normal left ventricular contraction with inferior hypokinesis (LVEF 50-55%) and moderately elevated filling pressure (LVEDP 25-30 mmHg). Successful PCI to distal RCA with placement of overlapping Resolute Onyx 3.0 x 18 mm (proximal) and 3.0 x 15 mm (distal) drug-eluting stents with 0% residual stenosis and TIMI-3 flow.  The majority of the RCA is now stented with the proximal and distal segments having 2 layers.   EKG personally reviewed by myself on todays visit Shows normal sinus rhythm rate 77 bpm no significant ST or T wave changes  Other past medical history reviewed Cardiac catheterization November 2018 Medical management recommended for moderate proximal RCA disease, left circumflex disease   Echocardiogram done November 2018 Normal LV function   cardiac catheterization April 2018 for unstable angina, minimal troponin elevation,  Procedure April 2018 with ISR of RCA stent proximal to mid Vessel PCI with resolute onyx 3.5 x 30 mm  Echo: 02/2017 Left ventricle: The cavity size was normal. Systolic function was   normal. The estimated ejection fraction was in the range of 55%   to 60%. Wall motion was normal; there were no regional wall   motion abnormalities. Doppler parameters are consistent with   abnormal left ventricular relaxation (grade 1 diastolic  dysfunction). - Right ventricle: Systolic function was normal. - Pulmonary arteries: Systolic pressure was within the normal  range.   Past Medical History:  Diagnosis Date   Anxiety    Arthritis    "qwhere" (06/14/2014)   Bipolar disorder (HCC)    CAD (coronary  artery disease)    a. 2008 DES->RCA; b. 2012 PCI: s/p DES; b. 05/2014 DES-> 90 dRCA; c. 11/16 PCI: dRCA ISR->DES;  d. 4/18 PCI: p-mRCA 95% s/p PCI/DES; e. 02/2017 Cath: patent dRCA stent, RPDA 90ost->Med Rx; f. 05/2017 PTCA mRCA 2/2 ISR; g. 05/2019 Cath: stable dzs; f. 08/2020 PCI: LM nl, LAD nl, D1 40, LCX 60p/m, OM1 50, RCA 20p/m, 12m/d (3.0x18 & 3.0x15 Resolute Onyx DESs), PDA 80, RPDA 80, RPAV 100. EF 50-55.   Chronic bronchitis (HCC)    "get it q yr"   Claudication in peripheral vascular disease (HCC)    Daily headache    "when my blood pressure is up" (06/14/2014)   Depression    Diastolic dysfunction    a. 02/2017 Echo: EF 55-60%, no rwma, Gr1 DD, nl RV fxn.   Diastolic dysfunction    a. 08/2020 Echo: EF 55-60%, mod LVH, grI DD. NL RV fxn.   GERD (gastroesophageal reflux disease)    Heart murmur    High cholesterol    History of stomach ulcers    Hypertension    Insomnia    Kidney stones    Migraine    "haven't had them in a good while; did get them 1-2 times/yr" (06/14/2014)   Myocardial infarction (HCC) 2008; ~ 2012   PAD (peripheral artery disease) (HCC)    a. 01/2021 LE duplex: RCFA 30-49, R AT 30-49, LCFA 50-74, L Pop 50-74, L AT 50-74, L Peroneal 100.   Pneumonia    "get it 1-2 times/year" (06/14/2014)   RLS (restless legs syndrome)    Seizures (HCC)    Sleep apnea    "they want me to do the lab but I haven't" (06/14/2014)   Stroke [facility])    "they say I've had several" (06/14/2014)   Tobacco abuse    Type II diabetes mellitus (HCC)    Urinary, incontinence, stress female    Past Surgical History:  Procedure Laterality Date   ABDOMINAL HYSTERECTOMY  1984   "I've got 1 ovary left"   CARDIAC CATHETERIZATION N/A 02/26/2015   Procedure: Left Heart Cath and Coronary Angiography;  Surgeon: Iran Ouch, MD;  Location: [facility] INVASIVE CV LAB;  Service: Cardiovascular;  Laterality: N/A;   CARDIAC CATHETERIZATION N/A 02/26/2015   Procedure: Coronary Stent Intervention;   Surgeon: Iran Ouch, MD;  Location: [facility];  Service: Cardiovascular;  Laterality: N/A;   CESAREAN SECTION  1984   CORONARY ANGIOPLASTY WITH STENT PLACEMENT  2008; ~ 2012   "1; 1"   CORONARY BALLOON ANGIOPLASTY N/A 06/09/2017   Procedure: CORONARY BALLOON ANGIOPLASTY;  Surgeon: Yvonne Kendall, MD;  Location: [facility];  Service: Cardiovascular;  Laterality: N/A;   CORONARY STENT INTERVENTION N/A 08/04/2016   Procedure: Coronary Stent Intervention;  Surgeon: Iran Ouch, MD;  Location: [facility];  Service: Cardiovascular;  Laterality: N/A;   CORONARY/GRAFT ACUTE MI REVASCULARIZATION N/A 09/05/2020   Procedure: Coronary/Graft Acute MI Revascularization;  Surgeon: Yvonne Kendall, MD;  Location: [facility];  Service: Cardiovascular;  Laterality: N/A;   DILATION AND CURETTAGE OF UTERUS     EXTRACORPOREAL SHOCK WAVE LITHOTRIPSY  X 2   I & D  EXTREMITY Left 02/09/2018   Procedure: IRRIGATION AND DEBRIDEMENT EXTREMITY;  Surgeon: Carolan Shiver, MD;  Location: [facility] ORS;  Service: General;  Laterality: Left;   KNEE ARTHROSCOPY Left    LEFT HEART CATH Bilateral 08/04/2016   Procedure: Left Heart Cath poss PCI;  Surgeon: Iran Ouch, MD;  Location: [facility];  Service: Cardiovascular;  Laterality: Bilateral;   LEFT HEART CATH AND CORONARY ANGIOGRAPHY Left 02/19/2017   Procedure: LEFT HEART CATH AND CORONARY ANGIOGRAPHY;  Surgeon: Antonieta Iba, MD;  Location: [facility];  Service: Cardiovascular;  Laterality: Left;   LEFT HEART CATH AND CORONARY ANGIOGRAPHY Left 06/09/2017   Procedure: LEFT HEART CATH AND CORONARY ANGIOGRAPHY;  Surgeon: Yvonne Kendall, MD;  Location: [facility];  Service: Cardiovascular;  Laterality: Left;   LEFT HEART CATH AND CORONARY ANGIOGRAPHY N/A 04/10/2020   Procedure: LEFT HEART CATH AND CORONARY ANGIOGRAPHY;  Surgeon: Iran Ouch, MD;  Location: [facility];   Service: Cardiovascular;  Laterality: N/A;   LEFT HEART CATH AND CORONARY ANGIOGRAPHY N/A 09/05/2020   Procedure: LEFT HEART CATH AND CORONARY ANGIOGRAPHY;  Surgeon: Yvonne Kendall, MD;  Location: [facility];  Service: Cardiovascular;  Laterality: N/A;   LEFT HEART CATH AND CORONARY ANGIOGRAPHY N/A 07/14/2022   Procedure: LEFT HEART CATH AND CORONARY ANGIOGRAPHY;  Surgeon: Iran Ouch, MD;  Location: [facility];  Service: Cardiovascular;  Laterality: N/A;   LEFT HEART CATHETERIZATION WITH CORONARY ANGIOGRAM N/A 06/15/2014   Procedure: LEFT HEART CATHETERIZATION WITH CORONARY ANGIOGRAM;  Surgeon: Corky Crafts, MD;  Location: [facility] CATH LAB;  Service: Cardiovascular;  Laterality: N/A;   PERCUTANEOUS CORONARY STENT INTERVENTION (PCI-S)  06/15/2014   Procedure: PERCUTANEOUS CORONARY STENT INTERVENTION (PCI-S);  Surgeon: Corky Crafts, MD;  Location: [facility] CATH LAB;  Service: Cardiovascular;;   TONSILLECTOMY     TUBAL LIGATION       No outpatient medications have been marked as taking for the 02/17/23 encounter (Appointment) with Antonieta Iba, MD.     Allergies:   Buprenorphine hcl, Carbamazepine, Lithium, Lodine [etodolac], Methocarbamol, Trazodone, Levofloxacin in d5w, Tape, Wound dressing adhesive, Lyrica [pregabalin], and Relafen [nabumetone]   Social History   Tobacco Use   Smoking status: Every Day    Current packs/day: 1.00    Average packs/day: 1 pack/day for 35.0 years (35.0 ttl pk-yrs)    Types: Cigarettes   Smokeless tobacco: Never  Vaping Use   Vaping status: Never Used  Substance Use Topics   Alcohol use: Yes    Alcohol/week: 0.0 standard drinks of alcohol    Comment: occ   Drug use: Yes    Types: Marijuana    Comment: used about 3 days ago     Family Hx: The patient's family history includes Cirrhosis in her father; Lung cancer in her mother.  ROS:   Please see the history of present illness.    Review of Systems  Constitutional:  Negative.   Respiratory:  Positive for cough, shortness of breath and wheezing.   Cardiovascular: Negative.   Gastrointestinal: Negative.   Musculoskeletal: Negative.   Neurological: Negative.   Psychiatric/Behavioral: Negative.    All other systems reviewed and are negative.    Labs/Other Tests and Data Reviewed:    Recent Labs: 01/29/2023: BUN 19; Creatinine, Ser 0.75; Hemoglobin 13.2; Platelets 233; Potassium 3.9; Sodium 137   Recent Lipid Panel Lab Results  Component Value Date/Time   CHOL 105 07/12/2022 05:09 AM   CHOL  170 12/09/2019 03:28 PM   TRIG 94 07/12/2022 05:09 AM   HDL 47 07/12/2022 05:09 AM   HDL 38 (L) 12/09/2019 03:28 PM   CHOLHDL 2.2 07/12/2022 05:09 AM   LDLCALC 39 07/12/2022 05:09 AM   LDLCALC 103 (H) 12/09/2019 03:28 PM   LDLDIRECT 183.5 (H) 07/27/2020 04:01 PM    Wt Readings from Last 3 Encounters:  08/12/22 131 lb 9.6 oz (59.7 kg)  07/11/22 126 lb 12.2 oz (57.5 kg)  06/09/22 126 lb 8 oz (57.4 kg)     Exam:    There were no vitals taken for this visit. Constitutional:  oriented to person, place, and time. No distress.  HENT:  Head: Grossly normal Eyes:  no discharge. No scleral icterus.  Neck: No JVD, no carotid bruits  Cardiovascular: Regular rate and rhythm, no murmurs appreciated Pulmonary/Chest: Clear to auscultation bilaterally, no wheezes or rails Abdominal: Soft.  no distension.  no tenderness.  Musculoskeletal: Normal range of motion Neurological:  normal muscle tone. Coordination normal. No atrophy Skin: Skin warm and dry Psychiatric: normal affect, pleasant  ASSESSMENT & PLAN:    Preop clearance for nerve block Acceptable risk for procedure and to stop her Brilinta 5 up to 7 days if needed No further cardiac testing needed  Coronary artery disease of native artery of native heart with stable angina pectoris (HCC) Currently with no symptoms of angina. No further workup at this time. Continue current medication regimen. Smoking  cessation recommended, new prescription sent in for Praluent, compliance recommended for her Lipitor  Chronic obstructive pulmonary disease, unspecified COPD type (HCC) We have encouraged her to continue to work on weaning her cigarettes and smoking cessation. She will continue to work on this and does not want any assistance with chantix.  Half pack per day  Poorly controlled type 2 diabetes mellitus with complication (HCC) Prior history poorly controlled diabetes, recommend medication compliance  Essential hypertension Blood pressure elevated, reports she did not take her medications today, recently started on lisinopril with her metoprolol  Mixed hyperlipidemia Continue statin Lipitor 80 daily Refill on Praluent, recheck lipids 3 months  Chronic diarrhea Managed by primary care   Total encounter time more than 30 minutes  Greater than 50% was spent in counseling and coordination of care with the patient  Signed, Julien Nordmann, MD  02/16/2023 1:26 PM    [facility] [address], [place], [place] [address]

## 2023-02-17 ENCOUNTER — Ambulatory Visit: Payer: 59 | Attending: Cardiovascular Disease | Admitting: Cardiovascular Disease

## 2023-02-17 DIAGNOSIS — J432 Centrilobular emphysema: Secondary | ICD-10-CM

## 2023-02-17 DIAGNOSIS — I255 Ischemic cardiomyopathy: Secondary | ICD-10-CM

## 2023-02-17 DIAGNOSIS — I25118 Atherosclerotic heart disease of native coronary artery with other forms of angina pectoris: Secondary | ICD-10-CM

## 2023-02-17 DIAGNOSIS — I10 Essential (primary) hypertension: Secondary | ICD-10-CM

## 2023-02-17 DIAGNOSIS — I739 Peripheral vascular disease, unspecified: Secondary | ICD-10-CM

## 2023-02-17 DIAGNOSIS — E1165 Type 2 diabetes mellitus with hyperglycemia: Secondary | ICD-10-CM

## 2023-02-17 DIAGNOSIS — Z720 Tobacco use: Secondary | ICD-10-CM

## 2023-02-17 DIAGNOSIS — E782 Mixed hyperlipidemia: Secondary | ICD-10-CM

## 2023-02-18 ENCOUNTER — Encounter: Payer: Self-pay | Admitting: Cardiovascular Disease

## 2023-05-20 ENCOUNTER — Other Ambulatory Visit: Payer: Self-pay | Admitting: Cardiovascular Disease

## 2023-05-20 DIAGNOSIS — I739 Peripheral vascular disease, unspecified: Secondary | ICD-10-CM

## 2023-05-20 DIAGNOSIS — E782 Mixed hyperlipidemia: Secondary | ICD-10-CM

## 2023-05-20 DIAGNOSIS — I25118 Atherosclerotic heart disease of native coronary artery with other forms of angina pectoris: Secondary | ICD-10-CM

## 2023-06-01 ENCOUNTER — Other Ambulatory Visit: Payer: Self-pay | Admitting: Cardiovascular Disease

## 2023-06-14 NOTE — Progress Notes (Unsigned)
 Date:  06/16/2023   ID:  Jennifer [name], DOB 14-Apr-1961, MRN [id number]  Patient Location:  [address] [place] [address]   Provider location:   [facility], [facility] office  PCP:  [employer] [facility] [employer]  Cardiologist:  Julien Nordmann, MD   Chief Complaint  Patient presents with   6 month follow up     Patient c/o anxiety, shortness of breath & chest pain.    History of Present Illness:    Jennifer [name] is a 63 y.o. female   past medical history of Poorly controlled diabetes type 2, hemoglobin A1c >13 Active smoker coronary artery disease, stent to the mid RCA 2008, repeat stenting to the mid RCA 2012 ,   catheterization February 2016 showing severe distal RCA disease estimated at 90%, patent mid RCA stents, also with 70% mid to distal circumflex disease of a small vessel,  Catheterization April 2018 stent to the right Catheterization November 2018 medical management recommended Ejection fraction 40 to 45% March 2024 presenting for routine follow-up of her coronary artery disease  Last seen by myself in clinic 2/24 Seen by one of our providers April 2024  In follow-up today she reports continued stress at home Continues to smoke, less than 1 pack/day Living at friends house, Family with drugs, incarceration, problems with grandchildren " I have to help them" Has a trailer she can move back to eventually, cannot go back there with the grandchildren  Reports blood pressure running high Often ranexa  Episodes of panic attack, shortness of breath, chest tightness relieved with nitro  Lab work reviewed A1C 9.6  EKG personally reviewed by myself on todays visit EKG Interpretation Date/Time:  Tuesday June 16 2023 10:05:18 EST Ventricular Rate:  81 PR Interval:  178 QRS Duration:  96 QT Interval:  408 QTC Calculation: 473 R Axis:   44  Text Interpretation: Normal sinus rhythm Anterior infarct , age undetermined When compared  with ECG of 13-Jul-2022 10:12, PR interval has decreased Anterior infarct is now Present T wave inversion no longer evident in Inferior leads T wave inversion no longer evident in Anterolateral leads Confirmed by Julien Nordmann ([id number]) on 06/16/2023 10:18:14 AM    Cardiac catheterization March 2024 Significant two-vessel coronary artery disease with occluded distal RCA stents likely due to very late stent thrombosis.  Well-developed left to right collaterals from the LAD.  Borderline significant disease in OM1 and left circumflex but vessel diameter is 2 mm at most. --Mildly to moderately reduced LV systolic function with an EF of 35 to 40%.  Moderately elevated left ventricular end-diastolic pressure.   RCA is well collateralized had extensive stenting in the past.   The left circumflex is too small in diameter to stent.  Prior records /procedres reviewed Cardiac catheterization May 2022 Stent placed to distal RCA  Cardiac catheterization November 2018 Medical management recommended for moderate proximal RCA disease, left circumflex disease   Echocardiogram done November 2018 Normal LV function   cardiac catheterization April 2018 for unstable angina, minimal troponin elevation,  Procedure April 2018 with ISR of RCA stent proximal to mid Vessel PCI with resolute onyx 3.5 x 30 mm   Past Medical History:  Diagnosis Date   Anxiety    Arthritis    "qwhere" (06/14/2014)   Bipolar disorder (HCC)    CAD (coronary artery disease)    a. 2008 DES->RCA; b. 2012 PCI: s/p DES; b. 05/2014 DES-> 90 dRCA; c.  11/16 PCI: dRCA ISR->DES;  d. 4/18 PCI: p-mRCA 95% s/p PCI/DES; e. 02/2017 Cath: patent dRCA stent, RPDA 90ost->Med Rx; f. 05/2017 PTCA mRCA 2/2 ISR; g. 05/2019 Cath: stable dzs; f. 08/2020 PCI: LM nl, LAD nl, D1 40, LCX 60p/m, OM1 50, RCA 20p/m, 18m/d (3.0x18 & 3.0x15 Resolute Onyx DESs), PDA 80, RPDA 80, RPAV 100. EF 50-55.   Chronic bronchitis (HCC)    "get it q yr"   Claudication in  peripheral vascular disease (HCC)    Daily headache    "when my blood pressure is up" (06/14/2014)   Depression    Diastolic dysfunction    a. 02/2017 Echo: EF 55-60%, no rwma, Gr1 DD, nl RV fxn.   Diastolic dysfunction    a. 08/2020 Echo: EF 55-60%, mod LVH, grI DD. NL RV fxn.   GERD (gastroesophageal reflux disease)    Heart murmur    High cholesterol    History of stomach ulcers    Hypertension    Insomnia    Kidney stones    Migraine    "haven't had them in a good while; did get them 1-2 times/yr" (06/14/2014)   Myocardial infarction (HCC) 2008; ~ 2012   PAD (peripheral artery disease) (HCC)    a. 01/2021 LE duplex: RCFA 30-49, R AT 30-49, LCFA 50-74, L Pop 50-74, L AT 50-74, L Peroneal 100.   Pneumonia    "get it 1-2 times/year" (06/14/2014)   RLS (restless legs syndrome)    Seizures (HCC)    Sleep apnea    "they want me to do the lab but I haven't" (06/14/2014)   Stroke [facility])    "they say I've had several" (06/14/2014)   Tobacco abuse    Type II diabetes mellitus (HCC)    Urinary, incontinence, stress female    Past Surgical History:  Procedure Laterality Date   ABDOMINAL HYSTERECTOMY  1984   "I've got 1 ovary left"   CARDIAC CATHETERIZATION N/A 02/26/2015   Procedure: Left Heart Cath and Coronary Angiography;  Surgeon: Iran Ouch, MD;  Location: [facility] INVASIVE CV LAB;  Service: Cardiovascular;  Laterality: N/A;   CARDIAC CATHETERIZATION N/A 02/26/2015   Procedure: Coronary Stent Intervention;  Surgeon: Iran Ouch, MD;  Location: [facility] INVASIVE CV LAB;  Service: Cardiovascular;  Laterality: N/A;   CESAREAN SECTION  1984   CORONARY ANGIOPLASTY WITH STENT PLACEMENT  2008; ~ 2012   "1; 1"   CORONARY BALLOON ANGIOPLASTY N/A 06/09/2017   Procedure: CORONARY BALLOON ANGIOPLASTY;  Surgeon: Yvonne Kendall, MD;  Location: [facility] INVASIVE CV LAB;  Service: Cardiovascular;  Laterality: N/A;   CORONARY STENT INTERVENTION N/A 08/04/2016   Procedure: Coronary Stent Intervention;   Surgeon: Iran Ouch, MD;  Location: [facility] INVASIVE CV LAB;  Service: Cardiovascular;  Laterality: N/A;   CORONARY/GRAFT ACUTE MI REVASCULARIZATION N/A 09/05/2020   Procedure: Coronary/Graft Acute MI Revascularization;  Surgeon: Yvonne Kendall, MD;  Location: [facility] INVASIVE CV LAB;  Service: Cardiovascular;  Laterality: N/A;   DILATION AND CURETTAGE OF UTERUS     EXTRACORPOREAL SHOCK WAVE LITHOTRIPSY  X 2   I & D EXTREMITY Left 02/09/2018   Procedure: IRRIGATION AND DEBRIDEMENT EXTREMITY;  Surgeon: Carolan Shiver, MD;  Location: [facility] ORS;  Service: General;  Laterality: Left;   KNEE ARTHROSCOPY Left    LEFT HEART CATH Bilateral 08/04/2016   Procedure: Left Heart Cath poss PCI;  Surgeon: Iran Ouch, MD;  Location: [facility] CV LAB;  Service: Cardiovascular;  Laterality: Bilateral;   LEFT HEART CATH  AND CORONARY ANGIOGRAPHY Left 02/19/2017   Procedure: LEFT HEART CATH AND CORONARY ANGIOGRAPHY;  Surgeon: Antonieta Iba, MD;  Location: [facility];  Service: Cardiovascular;  Laterality: Left;   LEFT HEART CATH AND CORONARY ANGIOGRAPHY Left 06/09/2017   Procedure: LEFT HEART CATH AND CORONARY ANGIOGRAPHY;  Surgeon: Yvonne Kendall, MD;  Location: [facility] CV [facility];  Service: Cardiovascular;  Laterality: Left;   LEFT HEART CATH AND CORONARY ANGIOGRAPHY N/A 04/10/2020   Procedure: LEFT HEART CATH AND CORONARY ANGIOGRAPHY;  Surgeon: Iran Ouch, MD;  Location: [facility] CV [facility];  Service: Cardiovascular;  Laterality: N/A;   LEFT HEART CATH AND CORONARY ANGIOGRAPHY N/A 09/05/2020   Procedure: LEFT HEART CATH AND CORONARY ANGIOGRAPHY;  Surgeon: Yvonne Kendall, MD;  Location: [facility] CV [facility];  Service: Cardiovascular;  Laterality: N/A;   LEFT HEART CATH AND CORONARY ANGIOGRAPHY N/A 07/14/2022   Procedure: LEFT HEART CATH AND CORONARY ANGIOGRAPHY;  Surgeon: Iran Ouch, MD;  Location: [facility];  Service: Cardiovascular;  Laterality: N/A;    LEFT HEART CATHETERIZATION WITH CORONARY ANGIOGRAM N/A 06/15/2014   Procedure: LEFT HEART CATHETERIZATION WITH CORONARY ANGIOGRAM;  Surgeon: Corky Crafts, MD;  Location: [facility] CATH LAB;  Service: Cardiovascular;  Laterality: N/A;   PERCUTANEOUS CORONARY STENT INTERVENTION (PCI-S)  06/15/2014   Procedure: PERCUTANEOUS CORONARY STENT INTERVENTION (PCI-S);  Surgeon: Corky Crafts, MD;  Location: [facility] CATH LAB;  Service: Cardiovascular;;   TONSILLECTOMY     TUBAL LIGATION       Current Meds  Medication Sig   albuterol (PROVENTIL HFA;VENTOLIN HFA) 108 (90 Base) MCG/ACT inhaler Inhale 2 puffs into the lungs every 4 (four) hours as needed for wheezing or shortness of breath.   aspirin 81 MG EC tablet Take 1 tablet (81 mg total) by mouth daily.   atorvastatin (LIPITOR) 80 MG tablet Take 1 tablet (80 mg total) by mouth daily.   empagliflozin (JARDIANCE) 25 MG TABS tablet Take 1 tablet (25 mg total) by mouth daily.   ENTRESTO 24-26 MG TAKE 1 TABLET BY MOUTH TWICE DAILY   gabapentin (NEURONTIN) 600 MG tablet Take 1.5 mg by mouth 4 (four) times daily.   ibuprofen (ADVIL) 800 MG tablet Take 800 mg by mouth every 6 (six) hours as needed.   LORazepam (ATIVAN) 2 MG tablet Take 2 mg by mouth daily as needed.   QUEtiapine (SEROQUEL) 200 MG tablet Take 1 tablet (200 mg total) by mouth at bedtime.   ticagrelor (BRILINTA) 60 MG TABS tablet Take 1 tablet (60 mg total) by mouth 2 (two) times daily. Please call [phone] to schedule an appointment for further refills. Thank you.   tiZANidine (ZANAFLEX) 2 MG tablet Take 2 mg by mouth every 8 (eight) hours as needed.   TRULICITY 0.75 MG/0.5ML SOPN Inject 0.75 mg into the skin once a week.   [DISCONTINUED] nitroGLYCERIN (NITROSTAT) 0.4 MG SL tablet Place 1 tablet (0.4 mg total) under the tongue every 5 (five) minutes as needed for chest pain.     Allergies:   Buprenorphine hcl, Carbamazepine, Lithium, Lodine [etodolac], Methocarbamol, Trazodone,  Acetaminophen, Levofloxacin in d5w, Other, Tape, Wound dressing adhesive, Lyrica [pregabalin], and Relafen [nabumetone]   Social History   Tobacco Use   Smoking status: Every Day    Current packs/day: 1.00    Average packs/day: 1 pack/day for 35.0 years (35.0 ttl pk-yrs)    Types: Cigarettes   Smokeless tobacco: Never  Vaping Use   Vaping status: Never Used  Substance Use Topics  Alcohol use: Yes    Alcohol/week: 0.0 standard drinks of alcohol    Comment: occ   Drug use: Yes    Types: Marijuana    Comment: used about 3 days ago     Family Hx: The patient's family history includes Cirrhosis in her father; Lung cancer in her mother.  ROS:   Please see the history of present illness.    Review of Systems  Constitutional: Negative.   HENT: Negative.    Respiratory:  Positive for shortness of breath.   Cardiovascular:  Positive for chest pain.  Gastrointestinal: Negative.   Musculoskeletal: Negative.   Neurological: Negative.   Psychiatric/Behavioral: Negative.    All other systems reviewed and are negative.    Labs/Other Tests and Data Reviewed:    Recent Labs: 01/29/2023: BUN 19; Creatinine, Ser 0.75; Hemoglobin 13.2; Platelets 233; Potassium 3.9; Sodium 137   Recent Lipid Panel Lab Results  Component Value Date/Time   CHOL 105 07/12/2022 05:09 AM   CHOL 170 12/09/2019 03:28 PM   TRIG 94 07/12/2022 05:09 AM   HDL 47 07/12/2022 05:09 AM   HDL 38 (L) 12/09/2019 03:28 PM   CHOLHDL 2.2 07/12/2022 05:09 AM   LDLCALC 39 07/12/2022 05:09 AM   LDLCALC 103 (H) 12/09/2019 03:28 PM   LDLDIRECT 183.5 (H) 07/27/2020 04:01 PM    Wt Readings from Last 3 Encounters:  06/16/23 138 lb 8 oz (62.8 kg)  08/12/22 131 lb 9.6 oz (59.7 kg)  07/11/22 126 lb 12.2 oz (57.5 kg)     Exam:    BP (!) 150/90 (BP Location: Left Arm, Patient Position: Sitting, Cuff Size: Normal)   Pulse 81   Ht 5\' 2"  (1.575 m)   Wt 138 lb 8 oz (62.8 kg)   SpO2 97%   BMI 25.33 kg/m  Constitutional:   oriented to person, place, and time. No distress.  HENT:  Head: Grossly normal Eyes:  no discharge. No scleral icterus.  Neck: No JVD, no carotid bruits  Cardiovascular: Regular rate and rhythm, no murmurs appreciated Pulmonary/Chest: Clear to auscultation bilaterally, no wheezes or rails Abdominal: Soft.  no distension.  no tenderness.  Musculoskeletal: Normal range of motion Neurological:  normal muscle tone. Coordination normal. No atrophy Skin: Skin warm and dry Psychiatric: normal affect, pleasant  ASSESSMENT & PLAN:    Coronary artery disease of native artery of native heart with stable angina pectoris (HCC) Currently with no symptoms of angina. No further workup at this time. Continue current medication regimen.  Chronic obstructive pulmonary disease, unspecified COPD type (HCC) We have encouraged her to continue to work on weaning her cigarettes and smoking cessation. She will continue to work on this and does not want any assistance with chantix.   Poorly controlled type 2 diabetes mellitus with complication (HCC) Prior history poorly controlled diabetes, recommend medication compliance  Essential hypertension Blood pressure elevated, recommend she increase her Entresto up to 49/51 mg twice daily Suggest she closely monitor blood pressure and call us with numbers  Mixed hyperlipidemia Continue Lipitor 80 daily Previously on Praluent  Chronic diarrhea Managed by primary care  Signed, Julien Nordmann, MD  06/16/2023 10:24 AM    [facility] [address], [place], [place] [address]

## 2023-06-16 ENCOUNTER — Ambulatory Visit: Payer: 59 | Attending: Cardiovascular Disease | Admitting: Cardiovascular Disease

## 2023-06-16 ENCOUNTER — Encounter: Payer: Self-pay | Admitting: Cardiovascular Disease

## 2023-06-16 VITALS — BP 150/90 | HR 81 | Ht 62.0 in | Wt 138.5 lb

## 2023-06-16 DIAGNOSIS — I25118 Atherosclerotic heart disease of native coronary artery with other forms of angina pectoris: Secondary | ICD-10-CM

## 2023-06-16 DIAGNOSIS — E785 Hyperlipidemia, unspecified: Secondary | ICD-10-CM

## 2023-06-16 DIAGNOSIS — E118 Type 2 diabetes mellitus with unspecified complications: Secondary | ICD-10-CM

## 2023-06-16 DIAGNOSIS — I10 Essential (primary) hypertension: Secondary | ICD-10-CM | POA: Diagnosis not present

## 2023-06-16 DIAGNOSIS — E782 Mixed hyperlipidemia: Secondary | ICD-10-CM

## 2023-06-16 DIAGNOSIS — Z720 Tobacco use: Secondary | ICD-10-CM

## 2023-06-16 DIAGNOSIS — I255 Ischemic cardiomyopathy: Secondary | ICD-10-CM | POA: Diagnosis not present

## 2023-06-16 DIAGNOSIS — J432 Centrilobular emphysema: Secondary | ICD-10-CM

## 2023-06-16 DIAGNOSIS — E1165 Type 2 diabetes mellitus with hyperglycemia: Secondary | ICD-10-CM

## 2023-06-16 DIAGNOSIS — I739 Peripheral vascular disease, unspecified: Secondary | ICD-10-CM

## 2023-06-16 MED ORDER — NITROGLYCERIN 0.4 MG SL SUBL: 0.4000 mg | SUBLINGUAL_TABLET | SUBLINGUAL | 12 refills | Status: AC | PRN

## 2023-06-16 MED ORDER — SACUBITRIL-VALSARTAN 49-51 MG PO TABS: 1.0000 | ORAL_TABLET | Freq: Two times a day (BID) | ORAL | 3 refills | Status: AC

## 2023-06-16 NOTE — Patient Instructions (Addendum)
 Medication Instructions:  Please increase the entresto up to 49/51 mg twice a day  Monitor blood pressure  If you need a refill on your cardiac medications before your next appointment, please call your pharmacy.   Lab work: No new labs needed  Testing/Procedures: No new testing needed  Follow-Up: At [facility], you and your health needs are our priority.  As part of our continuing mission to provide you with exceptional heart care, we have created designated Provider Care Teams.  These Care Teams include your primary Cardiologist (physician) and Advanced Practice Providers (APPs -  Physician Assistants and Nurse Practitioners) who all work together to provide you with the care you need, when you need it.  You will need a follow up appointment in 6 months, APP ok  Providers on your designated Care Team:   Nicolasa Ducking, NP Eula Listen, PA-C Cadence Fransico Michael, [place]  COVID-19 Vaccine Information can be found at: [email or web address] For questions related to vaccine distribution or appointments, please email [email or web address] or call [phone].

## 2023-06-22 ENCOUNTER — Other Ambulatory Visit: Payer: Self-pay | Admitting: *Deleted

## 2023-06-22 ENCOUNTER — Telehealth: Payer: Self-pay | Admitting: *Deleted

## 2023-06-22 DIAGNOSIS — Z87891 Personal history of nicotine dependence: Secondary | ICD-10-CM

## 2023-06-22 DIAGNOSIS — F17210 Nicotine dependence, cigarettes, uncomplicated: Secondary | ICD-10-CM

## 2023-06-22 DIAGNOSIS — Z122 Encounter for screening for malignant neoplasm of respiratory organs: Secondary | ICD-10-CM

## 2023-06-22 NOTE — Telephone Encounter (Signed)
 Lung Cancer Screening Narrative/Criteria Questionnaire (Cigarette Smokers Only- No Cigars/Pipes/vapes)   Jennifer [name]   SDMV:07/02/23 12:00- [name]                                           October 22, 1960              LDCT: 07/06/23 12:30- Op[place]    62 y.o.   Phone: [phone]  Lung Screening Narrative (confirm age 28-77 yrs Medicare / 50-80 yrs Private pay insurance)   Insurance information:[employer] [facility]   Referring Provider:Patel   This screening involves an initial phone call with a team member from our program. It is called a shared decision making visit. The initial meeting is required by insurance and Medicare to make sure you understand the program. This appointment takes about 15-20 minutes to complete. The CT scan will completed at a separate date/time. This scan takes about 5-10 minutes to complete and you may eat and drink before and after the scan.  Criteria questions for Lung Cancer Screening:   Are you a current or former smoker? Current Age began smoking: 14   If you are a former smoker, what year did you quit smoking?(within 15 yrs)   To calculate your smoking history, I need an accurate estimate of how many packs of cigarettes you smoked per day and for how many years. (Not just the number of PPD you are now smoking)   Years smoking 46 x Packs per day 1-2 = Pack years 67   (at least 20 pack yrs)   (Make sure they understand that we need to know how much they have smoked in the past, not just the number of PPD they are smoking now)  Do you have a personal history of cancer?  No    Do you have a family history of cancer? Yes  (cancer type and and relative) mother (lung) aunt (cervical)  Are you coughing up blood?  No  Have you had unexplained weight loss of 15 lbs or more in the last 6 months? Yes ( Taking Jardiance - recently gained some weight back)  It looks like you meet all criteria.     Additional information: n/a

## 2023-06-28 ENCOUNTER — Other Ambulatory Visit: Payer: Self-pay | Admitting: Cardiovascular Disease

## 2023-07-02 ENCOUNTER — Encounter: Admitting: Adult Health

## 2023-07-06 ENCOUNTER — Ambulatory Visit

## 2023-07-12 ENCOUNTER — Emergency Department
Admission: EM | Admit: 2023-07-12 | Discharge: 2023-07-13 | Disposition: A | Discharge status: Home / Self Care | Attending: Emergency Medicine | Admitting: Emergency Medicine

## 2023-07-12 ENCOUNTER — Other Ambulatory Visit: Payer: Self-pay

## 2023-07-12 ENCOUNTER — Emergency Department

## 2023-07-12 DIAGNOSIS — M533 Sacrococcygeal disorders, not elsewhere classified: Secondary | ICD-10-CM | POA: Insufficient documentation

## 2023-07-12 DIAGNOSIS — J449 Chronic obstructive pulmonary disease, unspecified: Secondary | ICD-10-CM | POA: Diagnosis not present

## 2023-07-12 DIAGNOSIS — I129 Hypertensive chronic kidney disease with stage 1 through stage 4 chronic kidney disease, or unspecified chronic kidney disease: Secondary | ICD-10-CM | POA: Diagnosis not present

## 2023-07-12 DIAGNOSIS — I2510 Atherosclerotic heart disease of native coronary artery without angina pectoris: Secondary | ICD-10-CM | POA: Insufficient documentation

## 2023-07-12 DIAGNOSIS — W19XXXA Unspecified fall, initial encounter: Secondary | ICD-10-CM | POA: Diagnosis not present

## 2023-07-12 DIAGNOSIS — N189 Chronic kidney disease, unspecified: Secondary | ICD-10-CM | POA: Diagnosis not present

## 2023-07-12 DIAGNOSIS — E1122 Type 2 diabetes mellitus with diabetic chronic kidney disease: Secondary | ICD-10-CM | POA: Insufficient documentation

## 2023-07-12 NOTE — ED Provider Notes (Signed)
 [facility] Provider Note    Event Date/Time   First MD Initiated Contact with Patient 07/12/23 2347     (approximate)   History   Chief Complaint Tailbone Pain   HPI  Jennifer [name] is a 63 y.o. female with past medical history of hypertension, diabetes, CAD, COPD, and CKD who presents to the ED complaining of tailbone pain.  Patient ports that 3 days ago she lost her balance and fell backwards, hitting her sacral area on a hard piece of wood.  She denies hitting her head or losing consciousness, has had significant pain in the left side of her sacral area since the fall.  She has been taking ibuprofen and using Lidoderm patches without significant relief.  She does state that she has been able to walk and denies any numbness or weakness in her legs.     Physical Exam   Triage Vital Signs: ED Triage Vitals  Encounter Vitals Group     BP 07/12/23 2239 (!) 150/111     Systolic BP Percentile --      Diastolic BP Percentile --      Pulse Rate 07/12/23 2239 (!) 101     Resp 07/12/23 2239 20     Temp 07/12/23 2240 98 F (36.7 C)     Temp Source 07/12/23 2240 Oral     SpO2 07/12/23 2239 100 %     Weight 07/12/23 2237 141 lb (64 kg)     Height 07/12/23 2237 5\' 2"  (1.575 m)     Head Circumference --      Peak Flow --      Pain Score 07/12/23 2237 10     Pain Loc --      Pain Education --      Exclude from Growth Chart --     Most recent vital signs: Vitals:   07/12/23 2239 07/12/23 2240  BP: (!) 150/111   Pulse: (!) 101   Resp: 20   Temp:  98 F (36.7 C)  SpO2: 100%     Constitutional: Alert and oriented. Eyes: Conjunctivae are normal. Head: Atraumatic. Nose: No congestion/rhinnorhea. Mouth/Throat: Mucous membranes are moist.  Cardiovascular: Normal rate, regular rhythm. Grossly normal heart sounds.  2+ radial pulses bilaterally. Respiratory: Normal respiratory effort.  No retractions. Lungs CTAB. Gastrointestinal: Soft and nontender. No  distention. Musculoskeletal: No lower extremity tenderness nor edema.  Midline lumbar spinal as well as sacral tenderness to palpation. Neurologic:  Normal speech and language. No gross focal neurologic deficits are appreciated.    ED Results / Procedures / Treatments   Labs (all labs ordered are listed, but only abnormal results are displayed) Labs Reviewed - No data to display   RADIOLOGY Lumbar x-ray reviewed and interpreted by me with no fracture or dislocation.  PROCEDURES:  Critical Care performed: No  Procedures   MEDICATIONS ORDERED IN ED: Medications - No data to display   IMPRESSION / MDM / ASSESSMENT AND PLAN / ED COURSE  I reviewed the triage vital signs and the nursing notes.                              63 y.o. female with past medical history of hypertension, diabetes, CAD, CKD, and COPD who presents to the ED complaining of lower back and sacral pain after a fall 3 days ago.  Patient's presentation is most consistent with acute complicated illness / injury requiring diagnostic  workup.  Differential diagnosis includes, but is not limited to, fracture, dislocation, contusion.  Patient nontoxic-appearing and in no acute distress, vital signs are unremarkable.  She has tenderness over her midline lumbar spine as well as her sacral area, x-ray imaging is unremarkable.  She is neurovascularly intact distally and appropriate for outpatient management.  Plan to prescribe muscle relaxant and Lidoderm patches, she was counseled to use ice as well as NSAIDs at home.  She was counseled to follow-up with her PCP and otherwise return to the ED for new or worsening symptoms.  Patient agrees with plan.      FINAL CLINICAL IMPRESSION(S) / ED DIAGNOSES   Final diagnoses:  Sacral pain  Fall, initial encounter     Rx / DC Orders   ED Discharge Orders          Ordered    cyclobenzaprine (FLEXERIL) 5 MG tablet  3 times daily PRN        07/13/23 0017    lidocaine  (LIDODERM) 5 %  Every 12 hours        07/13/23 0017             Note:  This document was prepared using Dragon voice recognition software and may include unintentional dictation errors.   Chesley Noon, MD 07/13/23 Jacinta Shoe

## 2023-07-12 NOTE — ED Triage Notes (Signed)
 Pt c/o tailbone pain after falling backwards on to it.

## 2023-07-13 MED ORDER — LIDOCAINE 5 % EX PTCH: 1.0000 | MEDICATED_PATCH | Freq: Two times a day (BID) | CUTANEOUS | 0 refills | Status: AC

## 2023-07-13 MED ORDER — CYCLOBENZAPRINE HCL 5 MG PO TABS
5.0000 mg | ORAL_TABLET | Freq: Three times a day (TID) | ORAL | 0 refills | Status: AC | PRN
Start: 2023-07-13 — End: ?

## 2023-08-28 ENCOUNTER — Other Ambulatory Visit: Payer: Self-pay | Admitting: Cardiovascular Disease

## 2023-09-11 ENCOUNTER — Other Ambulatory Visit: Payer: Self-pay

## 2023-09-11 ENCOUNTER — Emergency Department

## 2023-09-11 ENCOUNTER — Emergency Department
Admission: EM | Admit: 2023-09-11 | Discharge: 2023-09-11 | Disposition: A | Discharge status: Home / Self Care | Attending: Emergency Medicine | Admitting: Emergency Medicine

## 2023-09-11 DIAGNOSIS — R079 Chest pain, unspecified: Secondary | ICD-10-CM | POA: Insufficient documentation

## 2023-09-11 DIAGNOSIS — I2510 Atherosclerotic heart disease of native coronary artery without angina pectoris: Secondary | ICD-10-CM | POA: Insufficient documentation

## 2023-09-11 LAB — CBC
HCT: 40.1 % (ref 36.0–46.0)
Hemoglobin: 13.1 g/dL (ref 12.0–15.0)
MCH: 31.9 pg (ref 26.0–34.0)
MCHC: 32.7 g/dL (ref 30.0–36.0)
MCV: 97.6 fL (ref 80.0–100.0)
Platelets: 227 10*3/uL (ref 150–400)
RBC: 4.11 MIL/uL (ref 3.87–5.11)
RDW: 15 % (ref 11.5–15.5)
WBC: 8 10*3/uL (ref 4.0–10.5)
nRBC: 0 % (ref 0.0–0.2)

## 2023-09-11 LAB — BASIC METABOLIC PANEL WITH GFR
Anion gap: 9 (ref 5–15)
BUN: 20 mg/dL (ref 6–20)
CO2: 24 mmol/L (ref 22–32)
Calcium: 8.9 mg/dL (ref 8.9–10.3)
Chloride: 106 mmol/L (ref 98–111)
Creatinine, Ser: 0.82 mg/dL (ref 0.44–1.00)
GFR, Estimated: >60 mL/min (ref >60)
Glucose, Bld: 313 mg/dL — ABNORMAL HIGH (ref 70–99)
Potassium: 4.2 mmol/L (ref 3.5–5.1)
Sodium: 139 mmol/L (ref 135–145)

## 2023-09-11 LAB — PROTIME-INR
INR: 1 (ref 0.8–1.2)
Prothrombin Time: 12.9 s (ref 11.4–15.2)

## 2023-09-11 LAB — TROPONIN I (HIGH SENSITIVITY): Troponin I (High Sensitivity): 12 ng/L (ref <18)

## 2023-09-11 MED ORDER — IBUPROFEN 400 MG PO TABS
400.0000 mg | ORAL_TABLET | Freq: Once | ORAL | Status: AC
Start: 2023-09-11 — End: 2023-09-11
  Administered 2023-09-11: 400 mg via ORAL
  Filled 2023-09-11: qty 1

## 2023-09-11 NOTE — ED Triage Notes (Signed)
 Patient comes in via pov with complaints of cp since Sunday. Pt thought that the pain was a pulled muscle, but the pain has increased and now radiates into her neck. Pt complains of pain 10/10. Pt has a history MI, and is currently taking blood thinners.  Pt is alert and oriented x4 with no signs of acute distress at this time.

## 2023-09-11 NOTE — ED Notes (Signed)
Pt placed on cardiac monitor in hallway bed.

## 2023-09-11 NOTE — ED Provider Notes (Signed)
 [facility] Provider Note    Event Date/Time   First MD Initiated Contact with Patient 09/11/23 1905     (approximate)   History   Chest Pain   HPI  Jennifer [name] is a 63 y.o. female with a history of anxiety, bipolar disorder, CAD who reports that she had an episode of chest pain yesterday while in her car.  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  Currently she has no physical complaints.     Physical Exam   Triage Vital Signs: ED Triage Vitals  Encounter Vitals Group     BP 09/11/23 1829 (!) 139/103     Systolic BP Percentile --      Diastolic BP Percentile --      Pulse Rate 09/11/23 1829 90     Resp 09/11/23 1829 18     Temp 09/11/23 1829 98.4 F (36.9 C)     Temp src --      SpO2 09/11/23 1829 97 %     Weight 09/11/23 1830 61.2 kg (135 lb)     Height 09/11/23 1830 1.575 m (5\' 2" )     Head Circumference --      Peak Flow --      Pain Score 09/11/23 1830 10     Pain Loc --      Pain Education --      Exclude from Growth Chart --     Most recent vital signs: Vitals:   09/11/23 1829  BP: (!) 139/103  Pulse: 90  Resp: 18  Temp: 98.4 F (36.9 C)  SpO2: 97%     General: Awake, no distress.  CV:  Good peripheral perfusion.  Regular rate and rhythm Resp:  Normal effort.  Clear to auscultation bilaterally Abd:  No distention.  Other:  No calf pain or swelling   ED Results / Procedures / Treatments   Labs (all labs ordered are listed, but only abnormal results are displayed) Labs Reviewed  BASIC METABOLIC PANEL WITH GFR - Abnormal; Notable for the following components:      Result Value   Glucose, Bld 313 (*)    All other components within normal limits  CBC  PROTIME-INR  TROPONIN I (HIGH SENSITIVITY)  TROPONIN I (HIGH SENSITIVITY)     EKG  ED ECG REPORT I, Bryson Carbine, the attending physician, personally viewed and interpreted this  ECG.  Date: 09/11/2023  Rhythm: normal sinus rhythm QRS Axis: normal Intervals: normal ST/T Wave abnormalities: normal Narrative Interpretation: no evidence of acute ischemia    RADIOLOGY Chest x-ray viewed interpret by me, no acute abnormality    PROCEDURES:  Critical Care performed:   Procedures   MEDICATIONS ORDERED IN ED: Medications  ibuprofen  (ADVIL ) tablet 400 mg (400 mg Oral Given 09/11/23 1902)     IMPRESSION / MDM / ASSESSMENT AND PLAN / ED COURSE  I reviewed the triage vital signs and the nursing notes. Patient's presentation is most consistent with acute presentation with potential threat to life or bodily function.  Patient presents after episode of chest pain as detailed above, given her history differential includes ACS, anxiety, MI, not consistent with Pneumonia  Asymptomatic at this time and has been for all of today, no episodes of chest pain.  EKG unchanged from prior, high sensitive troponin is normal, she is asymptomatic and relieved by this workup, she states that she is ready  to go.  No indication for admission at this time, she knows to return if any change in her symptoms.          FINAL CLINICAL IMPRESSION(S) / ED DIAGNOSES   Final diagnoses:  Chest pain, unspecified type     Rx / DC Orders   ED Discharge Orders     None        Note:  This document was prepared using Dragon voice recognition software and may include unintentional dictation errors.   Bryson Carbine, MD 09/11/23 [phone]

## 2023-09-11 NOTE — ED Notes (Signed)
 Pt taken to xray. xray called and will bring pt to [facility]

## 2023-09-24 ENCOUNTER — Other Ambulatory Visit: Payer: Self-pay | Admitting: Cardiovascular Disease

## 2024-01-21 ENCOUNTER — Ambulatory Visit: Attending: Medical | Admitting: Medical

## 2024-01-21 NOTE — Progress Notes (Deleted)
  Cardiology Office Note   Date:  01/21/2024  ID:  Sybol, Morre 1960-05-05, MRN [id number] PCP: [facility]  [facility] Providers Cardiologist:  Evalene Lunger, MD { Click to update primary MD,subspecialty MD or APP then REFRESH:1}    History of Present Illness BRITINY DEFRAIN is a 63 y.o. female with a hx of CAD status post multiple right coronary artery interventions, hypertension, hyperlipidemia, depression, anxiety, bipolar disorder, diabetes, tobacco abuse and PAD who is being seen for hospital follow-up.   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  19 Aug 2020 showed an EF of 55 to 60% with moderate LVH and grade 1 diastolic dysfunction.  Diagnostic catheterization performed in the setting of recurrent chest pain in May 2022 showed an occluded distal RCA with thrombus into the small RPL branches.  The wise, she had nonobstructive disease which was similar to catheterization in December 2021.  EF was 50 to 55%.  The mid to distal RCA was treated with 2 overlapping drug-eluting stents, with recommendation for prolonged dual antiplatelet therapy.  LE arterial duplex in October 2022 showed mild bilateral LE PAD, which has been medically managed.     Patient was admitted April 2022 for chest pain after smoking a condensed version a week.  High-sensitivity troponin elevated to greater than 2000.  EKG with new subtle inferior and anterolateral ST elevation with associated T wave inversion and Qs.  Cardiac cath showed occluded distal RCA stents with well-developed left-to-right collaterals.  Recommended medical therapy.  The patient was last seen 05/2023 and was stable from a cardiac perspective.   Today,   ROS: ***  Studies Reviewed      *** Risk Assessment/Calculations {Does this patient have ATRIAL  FIBRILLATION?[phone]} No BP recorded.  {Refresh Note OR Click here to enter BP  :1}***       Physical Exam VS:  There were no vitals taken for this visit.       Wt Readings from Last 3 Encounters:  09/11/23 135 lb (61.2 kg)  07/12/23 141 lb (64 kg)  06/16/23 138 lb 8 oz (62.8 kg)    GEN: Well nourished, well developed in no acute distress NECK: No JVD; No carotid bruits CARDIAC: ***RRR, no murmurs, rubs, gallops RESPIRATORY:  Clear to auscultation without rales, wheezing or rhonchi  ABDOMEN: Soft, non-tender, non-distended EXTREMITIES:  No edema; No deformity   ASSESSMENT AND PLAN ***    {Are you ordering a CV Procedure (e.g. stress test, cath, DCCV, TEE, etc)?   Press F2        :[id number]}  Dispo: ***  Signed, Jalyn Rosero VEAR Fishman, PA-C
